# Patient Record
Sex: Female | Born: 1950
Health system: Southern US, Community
[De-identification: ages and names within clinical notes are randomized; demographics above are authoritative.]

## PROBLEM LIST (undated history)

## (undated) DIAGNOSIS — Z8709 Personal history of other diseases of the respiratory system: Secondary | ICD-10-CM

## (undated) DIAGNOSIS — F329 Major depressive disorder, single episode, unspecified: Secondary | ICD-10-CM

## (undated) DIAGNOSIS — Z9889 Other specified postprocedural states: Secondary | ICD-10-CM

## (undated) DIAGNOSIS — K219 Gastro-esophageal reflux disease without esophagitis: Secondary | ICD-10-CM

## (undated) DIAGNOSIS — F419 Anxiety disorder, unspecified: Secondary | ICD-10-CM

## (undated) DIAGNOSIS — C50912 Malignant neoplasm of unspecified site of left female breast: Secondary | ICD-10-CM

## (undated) DIAGNOSIS — I10 Essential (primary) hypertension: Secondary | ICD-10-CM

## (undated) DIAGNOSIS — J302 Other seasonal allergic rhinitis: Secondary | ICD-10-CM

## (undated) DIAGNOSIS — C50919 Malignant neoplasm of unspecified site of unspecified female breast: Secondary | ICD-10-CM

## (undated) DIAGNOSIS — M179 Osteoarthritis of knee, unspecified: Secondary | ICD-10-CM

## (undated) DIAGNOSIS — J189 Pneumonia, unspecified organism: Secondary | ICD-10-CM

## (undated) DIAGNOSIS — R4586 Emotional lability: Secondary | ICD-10-CM

## (undated) DIAGNOSIS — M2550 Pain in unspecified joint: Secondary | ICD-10-CM

## (undated) DIAGNOSIS — Z923 Personal history of irradiation: Secondary | ICD-10-CM

## (undated) DIAGNOSIS — R112 Nausea with vomiting, unspecified: Secondary | ICD-10-CM

## (undated) DIAGNOSIS — E785 Hyperlipidemia, unspecified: Secondary | ICD-10-CM

## (undated) DIAGNOSIS — M549 Dorsalgia, unspecified: Secondary | ICD-10-CM

## (undated) DIAGNOSIS — Z9221 Personal history of antineoplastic chemotherapy: Secondary | ICD-10-CM

## (undated) DIAGNOSIS — G4700 Insomnia, unspecified: Secondary | ICD-10-CM

## (undated) DIAGNOSIS — E119 Type 2 diabetes mellitus without complications: Secondary | ICD-10-CM

## (undated) DIAGNOSIS — Z87442 Personal history of urinary calculi: Secondary | ICD-10-CM

## (undated) DIAGNOSIS — M1710 Unilateral primary osteoarthritis, unspecified knee: Secondary | ICD-10-CM

## (undated) DIAGNOSIS — F32A Depression, unspecified: Secondary | ICD-10-CM

## (undated) DIAGNOSIS — Z1379 Encounter for other screening for genetic and chromosomal anomalies: Secondary | ICD-10-CM

## (undated) HISTORY — PX: MASTECTOMY: SHX3

## (undated) HISTORY — DX: Major depressive disorder, single episode, unspecified: F32.9

## (undated) HISTORY — DX: Osteoarthritis of knee, unspecified: M17.9

## (undated) HISTORY — PX: KNEE ARTHROSCOPY: SUR90

## (undated) HISTORY — DX: Unilateral primary osteoarthritis, unspecified knee: M17.10

## (undated) HISTORY — DX: Malignant neoplasm of unspecified site of unspecified female breast: C50.919

## (undated) HISTORY — DX: Encounter for other screening for genetic and chromosomal anomalies: Z13.79

## (undated) HISTORY — PX: COLONOSCOPY: SHX174

## (undated) HISTORY — DX: Type 2 diabetes mellitus without complications: E11.9

## (undated) HISTORY — DX: Hyperlipidemia, unspecified: E78.5

## (undated) HISTORY — PX: TUBAL LIGATION: SHX77

## (undated) HISTORY — PX: OTHER SURGICAL HISTORY: SHX169

## (undated) HISTORY — DX: Depression, unspecified: F32.A

## (undated) HISTORY — DX: Essential (primary) hypertension: I10

## (undated) HISTORY — PX: TOOTH EXTRACTION: SUR596

## (undated) SURGERY — INSERTION, TUNNELED CENTRAL VENOUS DEVICE, WITH PORT
Anesthesia: Choice

---
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
## 2004-01-06 ENCOUNTER — Ambulatory Visit: Payer: Self-pay | Admitting: Family Medicine

## 2005-10-04 ENCOUNTER — Emergency Department: Payer: Self-pay | Admitting: Emergency Medicine

## 2007-01-17 DIAGNOSIS — C50912 Malignant neoplasm of unspecified site of left female breast: Secondary | ICD-10-CM

## 2007-01-17 HISTORY — DX: Malignant neoplasm of unspecified site of left female breast: C50.912

## 2007-01-17 HISTORY — PX: BREAST BIOPSY: SHX20

## 2007-01-23 ENCOUNTER — Encounter: Payer: Self-pay | Admitting: Podiatry

## 2007-02-17 ENCOUNTER — Encounter: Payer: Self-pay | Admitting: Podiatry

## 2007-03-17 ENCOUNTER — Encounter: Payer: Self-pay | Admitting: Podiatry

## 2007-09-17 ENCOUNTER — Ambulatory Visit: Payer: Self-pay | Admitting: Internal Medicine

## 2007-09-27 ENCOUNTER — Other Ambulatory Visit: Payer: Self-pay

## 2007-09-27 ENCOUNTER — Ambulatory Visit: Payer: Self-pay

## 2007-10-07 ENCOUNTER — Ambulatory Visit: Payer: Self-pay | Admitting: Internal Medicine

## 2007-10-16 ENCOUNTER — Ambulatory Visit: Payer: Self-pay | Admitting: Surgery

## 2007-10-17 ENCOUNTER — Ambulatory Visit: Payer: Self-pay | Admitting: Internal Medicine

## 2007-11-17 ENCOUNTER — Ambulatory Visit: Payer: Self-pay | Admitting: Internal Medicine

## 2007-12-17 ENCOUNTER — Ambulatory Visit: Payer: Self-pay | Admitting: Internal Medicine

## 2008-01-17 ENCOUNTER — Ambulatory Visit: Payer: Self-pay | Admitting: Internal Medicine

## 2008-02-13 ENCOUNTER — Ambulatory Visit: Payer: Self-pay | Admitting: Internal Medicine

## 2008-02-17 ENCOUNTER — Ambulatory Visit: Payer: Self-pay | Admitting: Internal Medicine

## 2008-03-03 ENCOUNTER — Ambulatory Visit: Payer: Self-pay | Admitting: Surgery

## 2008-03-10 ENCOUNTER — Inpatient Hospital Stay: Payer: Self-pay | Admitting: Surgery

## 2008-03-16 ENCOUNTER — Ambulatory Visit: Payer: Self-pay | Admitting: Internal Medicine

## 2008-04-08 ENCOUNTER — Ambulatory Visit: Admission: RE | Admit: 2008-04-08 | Discharge: 2008-04-27 | Payer: Self-pay | Admitting: Radiation Oncology

## 2008-04-16 ENCOUNTER — Ambulatory Visit: Payer: Self-pay | Admitting: Internal Medicine

## 2008-05-16 ENCOUNTER — Ambulatory Visit: Payer: Self-pay | Admitting: Internal Medicine

## 2008-06-16 ENCOUNTER — Ambulatory Visit: Payer: Self-pay | Admitting: Internal Medicine

## 2008-07-16 ENCOUNTER — Ambulatory Visit: Payer: Self-pay | Admitting: Internal Medicine

## 2008-08-16 ENCOUNTER — Ambulatory Visit: Payer: Self-pay | Admitting: Internal Medicine

## 2008-09-16 ENCOUNTER — Ambulatory Visit: Payer: Self-pay | Admitting: Internal Medicine

## 2008-10-16 ENCOUNTER — Ambulatory Visit: Payer: Self-pay | Admitting: Internal Medicine

## 2008-11-16 ENCOUNTER — Ambulatory Visit: Payer: Self-pay | Admitting: Internal Medicine

## 2008-12-16 ENCOUNTER — Ambulatory Visit: Payer: Self-pay | Admitting: Internal Medicine

## 2009-01-16 ENCOUNTER — Ambulatory Visit: Payer: Self-pay | Admitting: Internal Medicine

## 2009-02-16 ENCOUNTER — Ambulatory Visit: Payer: Self-pay | Admitting: Internal Medicine

## 2009-03-16 ENCOUNTER — Ambulatory Visit: Payer: Self-pay | Admitting: Internal Medicine

## 2009-04-16 ENCOUNTER — Ambulatory Visit: Payer: Self-pay | Admitting: Internal Medicine

## 2009-05-17 ENCOUNTER — Ambulatory Visit: Payer: Self-pay | Admitting: Internal Medicine

## 2009-06-16 ENCOUNTER — Ambulatory Visit: Payer: Self-pay | Admitting: Internal Medicine

## 2009-07-16 ENCOUNTER — Ambulatory Visit: Payer: Self-pay | Admitting: Internal Medicine

## 2009-08-16 ENCOUNTER — Ambulatory Visit: Payer: Self-pay | Admitting: Internal Medicine

## 2009-09-16 ENCOUNTER — Ambulatory Visit: Payer: Self-pay | Admitting: Internal Medicine

## 2009-09-21 ENCOUNTER — Ambulatory Visit: Payer: Self-pay | Admitting: Internal Medicine

## 2009-10-16 ENCOUNTER — Ambulatory Visit: Payer: Self-pay | Admitting: Internal Medicine

## 2009-11-16 ENCOUNTER — Ambulatory Visit: Payer: Self-pay | Admitting: Internal Medicine

## 2009-12-17 ENCOUNTER — Ambulatory Visit: Payer: Self-pay | Admitting: Internal Medicine

## 2010-01-16 ENCOUNTER — Ambulatory Visit: Payer: Self-pay | Admitting: Internal Medicine

## 2010-02-16 ENCOUNTER — Ambulatory Visit: Payer: Self-pay | Admitting: Internal Medicine

## 2010-04-11 ENCOUNTER — Ambulatory Visit: Payer: Self-pay | Admitting: Internal Medicine

## 2010-04-17 ENCOUNTER — Ambulatory Visit: Payer: Self-pay | Admitting: Internal Medicine

## 2010-06-06 ENCOUNTER — Ambulatory Visit: Payer: Self-pay | Admitting: Internal Medicine

## 2010-06-07 LAB — CANCER ANTIGEN 27.29: CA 27.29: 16.4 U/mL (ref 0.0–38.6)

## 2010-06-17 ENCOUNTER — Ambulatory Visit: Payer: Self-pay | Admitting: Internal Medicine

## 2010-06-20 ENCOUNTER — Other Ambulatory Visit (HOSPITAL_COMMUNITY): Payer: Self-pay | Admitting: Ophthalmology

## 2010-06-20 ENCOUNTER — Ambulatory Visit (HOSPITAL_COMMUNITY)
Admission: RE | Admit: 2010-06-20 | Discharge: 2010-06-20 | Disposition: A | Discharge status: Home / Self Care | Payer: Managed Care, Other (non HMO) | Source: Ambulatory Visit | Attending: Ophthalmology | Admitting: Ophthalmology

## 2010-06-20 ENCOUNTER — Encounter (HOSPITAL_COMMUNITY)
Admission: RE | Admit: 2010-06-20 | Discharge: 2010-06-20 | Disposition: A | Discharge status: Home / Self Care | Payer: Managed Care, Other (non HMO) | Source: Ambulatory Visit | Attending: Ophthalmology | Admitting: Ophthalmology

## 2010-06-20 DIAGNOSIS — Z01812 Encounter for preprocedural laboratory examination: Secondary | ICD-10-CM | POA: Insufficient documentation

## 2010-06-20 DIAGNOSIS — H269 Unspecified cataract: Secondary | ICD-10-CM

## 2010-06-20 DIAGNOSIS — Z01818 Encounter for other preprocedural examination: Secondary | ICD-10-CM | POA: Insufficient documentation

## 2010-06-20 DIAGNOSIS — I10 Essential (primary) hypertension: Secondary | ICD-10-CM | POA: Insufficient documentation

## 2010-06-20 LAB — BASIC METABOLIC PANEL
Chloride: 98 mEq/L (ref 96–112)
GFR calc Af Amer: >60 mL/min (ref >60)
Potassium: 4.1 mEq/L (ref 3.5–5.1)
Sodium: 138 mEq/L (ref 135–145)

## 2010-06-20 LAB — CBC
Platelets: 248 10*3/uL (ref 150–400)
RBC: 4.43 MIL/uL (ref 3.87–5.11)
WBC: 6.8 10*3/uL (ref 4.0–10.5)

## 2010-06-30 ENCOUNTER — Ambulatory Visit (HOSPITAL_COMMUNITY)
Admission: RE | Admit: 2010-06-30 | Discharge: 2010-06-30 | Disposition: A | Discharge status: Home / Self Care | Payer: Managed Care, Other (non HMO) | Source: Ambulatory Visit | Attending: Ophthalmology | Admitting: Ophthalmology

## 2010-06-30 DIAGNOSIS — E669 Obesity, unspecified: Secondary | ICD-10-CM | POA: Insufficient documentation

## 2010-06-30 DIAGNOSIS — Z853 Personal history of malignant neoplasm of breast: Secondary | ICD-10-CM | POA: Insufficient documentation

## 2010-06-30 DIAGNOSIS — F329 Major depressive disorder, single episode, unspecified: Secondary | ICD-10-CM | POA: Insufficient documentation

## 2010-06-30 DIAGNOSIS — K219 Gastro-esophageal reflux disease without esophagitis: Secondary | ICD-10-CM | POA: Insufficient documentation

## 2010-06-30 DIAGNOSIS — IMO0002 Reserved for concepts with insufficient information to code with codable children: Secondary | ICD-10-CM | POA: Insufficient documentation

## 2010-06-30 DIAGNOSIS — I252 Old myocardial infarction: Secondary | ICD-10-CM | POA: Insufficient documentation

## 2010-06-30 DIAGNOSIS — M899 Disorder of bone, unspecified: Secondary | ICD-10-CM | POA: Insufficient documentation

## 2010-06-30 DIAGNOSIS — M1710 Unilateral primary osteoarthritis, unspecified knee: Secondary | ICD-10-CM | POA: Insufficient documentation

## 2010-06-30 DIAGNOSIS — F3289 Other specified depressive episodes: Secondary | ICD-10-CM | POA: Insufficient documentation

## 2010-06-30 DIAGNOSIS — E119 Type 2 diabetes mellitus without complications: Secondary | ICD-10-CM | POA: Insufficient documentation

## 2010-06-30 DIAGNOSIS — J309 Allergic rhinitis, unspecified: Secondary | ICD-10-CM | POA: Insufficient documentation

## 2010-06-30 DIAGNOSIS — I10 Essential (primary) hypertension: Secondary | ICD-10-CM | POA: Insufficient documentation

## 2010-06-30 DIAGNOSIS — F411 Generalized anxiety disorder: Secondary | ICD-10-CM | POA: Insufficient documentation

## 2010-06-30 LAB — GLUCOSE, CAPILLARY
Glucose-Capillary: 115 mg/dL — ABNORMAL HIGH (ref 70–99)
Glucose-Capillary: 100 mg/dL — ABNORMAL HIGH (ref 70–99)

## 2010-07-08 NOTE — Op Note (Signed)
  [name], [name] NO.:  [id number]  MEDICAL RECORD NO.:  [id number]  LOCATION:  SDSC                         FACILITY:  [facility]  PHYSICIAN:  Beulah Gandy. Ashley Royalty, M.D. DATE OF BIRTH:  [birth year]  DATE OF PROCEDURE:  06/30/2010 DATE OF DISCHARGE:  06/30/2010                              OPERATIVE REPORT   ADMISSION DIAGNOSES:  LATEX allergy and dense posterior subcapsular cataract right eye.  PROCEDURES:  Phacoemulsification with placement of primary intraocular lens in the bag.  SURGEON:  Beulah Gandy. Ashley Royalty, MD  ASSISTANT:  Rosalie Doctor, MA  ANESTHESIA:  General.  DETAILS:  Usual prep and drape, conjunctival peritomies at 2 o'clock, three-layered incision with a diamond knife, crescent blade and a keratome at 2 o'clock.  15 blade skin incision at 10 o'clock.  The Provisc was placed in the anterior chamber.  A capsulorrhexis was performed with capsulotome and then capsulorrhexis forceps were used to create a round 4 mm anterior capsulorrhexis.  Hydrodissection was performed in all four quadrants.  A phacoemulsification was used to emulsify the nuclear lens.  The divide and half technique was used and a Kuglen hook was placed at 10 o'clock corneal incisions for two-handed cataract surgery.  The nucleus was rotated and carefully removed with the phaco fragment tip.  Once the entire nucleus was removed, the capsule was inspected and was intact.  The IA device was used to remove cortical remnants from the periphery of the bag.  Each quadrant was inspected and all cortical remnants were removed.  Provisc was placed in the anterior chamber and in the bag itself.  The intraocular lens made by [employer]. model SN60WF, power 6.0 D, length 12.3 MM, optic 6.0 MM, serial number [id number] was brought onto the field, it was inspected and placed in the delivery device.  The lens was delivered by the surgeon into the anterior chamber, then into the  posterior chamber into the capsular bag.  It was dialed into place until the haptics were free at the horizontal 3 and 9 o'clock position.  The Provisc was removed with the IA tip and carefully from the anterior chamber angle and then from the capsular bag in front and behind the intraocular lens.  When all Provisc was removed, the instruments were removed from the eye.  The anterior chamber was formed and deep.  The wound was tested with Weck-cel sponge and found to be tight.  The conjunctiva was closed with wet-field cautery.  Polymyxin and gentamicin were irrigated into [place] space.  Marcaine was injected around the globe for postop pain.  Polysporin ophthalmic ointment was placed, Decadron 10 mg was injected into the lower subconjunctival space.  Closing pressure was soft.  A patch and shield were placed.  The patient was awakened and taken to recovery in satisfactory condition.  Complications none. Duration 40 minutes.     Beulah Gandy. Ashley Royalty, M.D.     JDM/MEDQ  D:  06/30/2010  T:  07/01/2010  Job:  [id number]  Electronically Signed by Alan Mulder M.D. on 07/08/2010 06:39:50 AM

## 2010-07-17 ENCOUNTER — Ambulatory Visit: Payer: Self-pay | Admitting: Internal Medicine

## 2010-09-05 ENCOUNTER — Ambulatory Visit: Payer: Self-pay | Admitting: Internal Medicine

## 2010-09-17 ENCOUNTER — Ambulatory Visit: Payer: Self-pay | Admitting: Internal Medicine

## 2010-10-14 ENCOUNTER — Encounter (INDEPENDENT_AMBULATORY_CARE_PROVIDER_SITE_OTHER): Payer: Managed Care, Other (non HMO) | Admitting: Ophthalmology

## 2010-10-14 DIAGNOSIS — H2700 Aphakia, unspecified eye: Secondary | ICD-10-CM

## 2010-10-14 DIAGNOSIS — H2510 Age-related nuclear cataract, unspecified eye: Secondary | ICD-10-CM

## 2010-10-14 DIAGNOSIS — H43819 Vitreous degeneration, unspecified eye: Secondary | ICD-10-CM

## 2010-10-14 DIAGNOSIS — E11319 Type 2 diabetes mellitus with unspecified diabetic retinopathy without macular edema: Secondary | ICD-10-CM

## 2010-10-25 ENCOUNTER — Ambulatory Visit (HOSPITAL_COMMUNITY)
Admission: RE | Admit: 2010-10-25 | Discharge: 2010-10-26 | Disposition: A | Discharge status: Home / Self Care | Payer: Managed Care, Other (non HMO) | Source: Ambulatory Visit | Attending: Ophthalmology | Admitting: Ophthalmology

## 2010-10-25 DIAGNOSIS — H269 Unspecified cataract: Secondary | ICD-10-CM | POA: Insufficient documentation

## 2010-10-25 DIAGNOSIS — IMO0002 Reserved for concepts with insufficient information to code with codable children: Secondary | ICD-10-CM

## 2010-10-25 LAB — CBC
HCT: 39.6 % (ref 36.0–46.0)
MCHC: 33.3 g/dL (ref 30.0–36.0)
MCV: 92.3 fL (ref 78.0–100.0)
Platelets: 226 10*3/uL (ref 150–400)
RDW: 12.5 % (ref 11.5–15.5)

## 2010-10-25 LAB — GLUCOSE, CAPILLARY
Glucose-Capillary: 120 mg/dL — ABNORMAL HIGH (ref 70–99)
Glucose-Capillary: 95 mg/dL (ref 70–99)

## 2010-10-25 LAB — BASIC METABOLIC PANEL
BUN: 21 mg/dL (ref 6–23)
Calcium: 9.2 mg/dL (ref 8.4–10.5)
Creatinine, Ser: 0.85 mg/dL (ref 0.50–1.10)
GFR calc Af Amer: 85 mL/min — ABNORMAL LOW (ref >90)
GFR calc non Af Amer: 74 mL/min — ABNORMAL LOW (ref >90)

## 2010-10-27 LAB — GLUCOSE, CAPILLARY
Glucose-Capillary: 192 mg/dL — ABNORMAL HIGH (ref 70–99)
Glucose-Capillary: 222 mg/dL — ABNORMAL HIGH (ref 70–99)

## 2010-10-30 NOTE — Op Note (Signed)
NAMETENISHIA, EKMAN NO.:  [id number]  MEDICAL RECORD NO.:  [id number]  LOCATION:  5118                         FACILITY:  [facility]  PHYSICIAN:  Beulah Gandy. Ashley Royalty, M.D. DATE OF BIRTH:  [birth year]  DATE OF PROCEDURE:  10/25/2010 DATE OF DISCHARGE:                              OPERATIVE REPORT   ADMISSION DIAGNOSES:  Cataract and latex allergy in the left eye.  PROCEDURES:  Cataract extraction with phacoemulsification and placement of intraocular lens in the sulcus, posterior vitrectomy and anterior vitrectomy all left eye.  SURGEON:  Beulah Gandy. Ashley Royalty, MD  ASSISTANT:  Rosalie Doctor, MA.  ANESTHESIA:  General.  Usual prep and drape.  This is a 15 degree side port incision at 10 o'clock.  Provisc placed in the anterior chamber.  A 3 layered diamond knife incision into the cornea with keratome incision into the cornea. Provisc placed in the anterior chamber.  Cystotome incision of anterior capsule.  Capsular forceps were used to perform a 6-mm capsulectomy, hydrodissection, rotation of the nucleus.  The Cook on hooks was placed in the side port and the phacoemulsification device in the 2 o'clock corneal wound.  The phacoemulsification was used to remove or to engage the nucleus and the nucleus came free and was quite mobile.  It came into the phacoemulsification device without difficulty.  Nucleus was rotated and all nuclear material was removed.  The IA was placed in the eye.  At this point, it was apparent that the posterior capsule was opened at the 4 o'clock and anterior vitrectomy was then performed to remove some small strands of vitreous from the opening in the posterior capsule up to the corneal wound.  Once the anterior vitrectomy was completed, the wound was extended and a CC70 BD lens made by [employer].  Power 10.0 d length, 12.5-mm optic, 7.0-mm serial #[id number] was brought onto the field, inspected and cleaned.  The lens was inserted  into the anterior chamber and then into the posterior chamber.  It was dialed into place, so the haptics were placed at 3 and 9 o'clock.  The lens was well positioned.  The decision was then made for a posterior vitrectomy.  The corneal wound was closed with 4 interrupted 10-0 nylon sutures and the wound was stable.  A 20-gauge 3 layered opening was placed in the sclera at 2 o'clock for initial infusion to make the eye to normal pressure again.  A 25-gauge trocars were placed at 4 o'clock and 10 o'clock.  The 25-gauge vitrectomy was begun.  Provisc was placed on the corneal surface.  Suddenly hemorrhage began coming from the 3 o'clock and the 9 o'clock areas behind the implant.  The bottle height was raised to 60 mmHg and the hemorrhage was cleared with vitrectomy.  The vitrectomy was carried posteriorly and the view was somewhat difficult because of a red cells on the anterior surface of the IOL.  The vitrectomy was carried posteriorly and all fragments of lens material were removed that could be found.  The instruments were removed from the eye and the pressure was reduced to 40, then to 20.  No additional hemorrhage occurred.  The wounds were  tested and found to be tight.  The implant was well positioned.  The conjunctiva was closed with 7-0 chromic suture.  The sclerotomy at 2 o'clock was closed with a 10-0 nylon suture.  All the wounds were intact.  Polymyxin and gentamicin were irrigated into tenon space. Marcaine was injected around the globe for postop pain.  Decadron 10 mg was injected to the lower subconjunctival space.  Closing pressure was 21 with a Baer care tonometer.  COMPLICATIONS:  Open posterior capsule.  OPERATIVE TIME:  2 hours.  The patient is awake and taken to recovery in satisfactory condition.     Beulah Gandy. Ashley Royalty, M.D.     JDM/MEDQ  D:  10/25/2010  T:  10/26/2010  Job:  [id number]  Electronically Signed by Alan Mulder M.D. on 10/30/2010 07:18:40 PM

## 2010-11-01 ENCOUNTER — Inpatient Hospital Stay (INDEPENDENT_AMBULATORY_CARE_PROVIDER_SITE_OTHER): Payer: Managed Care, Other (non HMO) | Admitting: Ophthalmology

## 2010-11-01 DIAGNOSIS — S0550XA Penetrating wound with foreign body of unspecified eyeball, initial encounter: Secondary | ICD-10-CM

## 2010-11-01 DIAGNOSIS — H2700 Aphakia, unspecified eye: Secondary | ICD-10-CM

## 2010-11-09 ENCOUNTER — Encounter (INDEPENDENT_AMBULATORY_CARE_PROVIDER_SITE_OTHER): Payer: Managed Care, Other (non HMO) | Admitting: Ophthalmology

## 2010-11-09 DIAGNOSIS — H2700 Aphakia, unspecified eye: Secondary | ICD-10-CM

## 2010-11-10 ENCOUNTER — Ambulatory Visit: Payer: Self-pay | Admitting: Internal Medicine

## 2010-11-28 ENCOUNTER — Ambulatory Visit: Payer: Self-pay | Admitting: Internal Medicine

## 2010-12-07 ENCOUNTER — Encounter (INDEPENDENT_AMBULATORY_CARE_PROVIDER_SITE_OTHER): Payer: Managed Care, Other (non HMO) | Admitting: Ophthalmology

## 2010-12-07 DIAGNOSIS — H2700 Aphakia, unspecified eye: Secondary | ICD-10-CM

## 2010-12-17 ENCOUNTER — Ambulatory Visit: Payer: Self-pay | Admitting: Internal Medicine

## 2011-01-26 ENCOUNTER — Ambulatory Visit (INDEPENDENT_AMBULATORY_CARE_PROVIDER_SITE_OTHER): Payer: Managed Care, Other (non HMO) | Admitting: Ophthalmology

## 2011-01-26 DIAGNOSIS — E1139 Type 2 diabetes mellitus with other diabetic ophthalmic complication: Secondary | ICD-10-CM

## 2011-01-26 DIAGNOSIS — H43819 Vitreous degeneration, unspecified eye: Secondary | ICD-10-CM

## 2011-01-26 DIAGNOSIS — E11319 Type 2 diabetes mellitus with unspecified diabetic retinopathy without macular edema: Secondary | ICD-10-CM

## 2011-02-20 ENCOUNTER — Ambulatory Visit: Payer: Self-pay | Admitting: Internal Medicine

## 2011-03-09 ENCOUNTER — Encounter (INDEPENDENT_AMBULATORY_CARE_PROVIDER_SITE_OTHER): Payer: Managed Care, Other (non HMO) | Admitting: Ophthalmology

## 2011-03-17 ENCOUNTER — Ambulatory Visit: Payer: Self-pay | Admitting: Internal Medicine

## 2011-04-17 ENCOUNTER — Ambulatory Visit: Payer: Self-pay | Admitting: Internal Medicine

## 2011-05-29 ENCOUNTER — Ambulatory Visit: Payer: Self-pay | Admitting: Oncology

## 2011-05-29 LAB — CBC CANCER CENTER
Basophil #: 0 x10 3/mm (ref 0.0–0.1)
Eosinophil #: 0.4 x10 3/mm (ref 0.0–0.7)
HCT: 40 % (ref 35.0–47.0)
HGB: 13.5 g/dL (ref 12.0–16.0)
Lymphocyte #: 1.7 x10 3/mm (ref 1.0–3.6)
MCH: 31.7 pg (ref 26.0–34.0)
MCHC: 33.7 g/dL (ref 32.0–36.0)
MCV: 94 fL (ref 80–100)
Monocyte %: 8.1 %
Neutrophil #: 2.9 x10 3/mm (ref 1.4–6.5)
Platelet: 275 x10 3/mm (ref 150–440)
WBC: 5.4 x10 3/mm (ref 3.6–11.0)

## 2011-05-29 LAB — COMPREHENSIVE METABOLIC PANEL
Albumin: 3.8 g/dL (ref 3.4–5.0)
BUN: 22 mg/dL — ABNORMAL HIGH (ref 7–18)
Bilirubin,Total: 0.3 mg/dL (ref 0.2–1.0)
Calcium, Total: 9 mg/dL (ref 8.5–10.1)
Co2: 23 mmol/L (ref 21–32)
Creatinine: 1 mg/dL (ref 0.60–1.30)
EGFR (African American): >60
EGFR (Non-African Amer.): >60
Glucose: 143 mg/dL — ABNORMAL HIGH (ref 65–99)
Osmolality: 289 (ref 275–301)
Potassium: 4 mmol/L (ref 3.5–5.1)
SGPT (ALT): 32 U/L
Total Protein: 7.5 g/dL (ref 6.4–8.2)

## 2011-05-30 LAB — CANCER ANTIGEN 27.29: CA 27.29: 19.3 U/mL (ref 0.0–38.6)

## 2011-06-17 ENCOUNTER — Ambulatory Visit: Payer: Self-pay | Admitting: Oncology

## 2011-06-28 ENCOUNTER — Ambulatory Visit: Payer: Self-pay | Admitting: Surgery

## 2011-07-17 ENCOUNTER — Ambulatory Visit: Payer: Self-pay | Admitting: Oncology

## 2011-11-14 ENCOUNTER — Ambulatory Visit: Payer: Self-pay | Admitting: Oncology

## 2011-12-20 ENCOUNTER — Ambulatory Visit: Payer: Self-pay | Admitting: Oncology

## 2011-12-20 LAB — COMPREHENSIVE METABOLIC PANEL
Albumin: 3.6 g/dL (ref 3.4–5.0)
Alkaline Phosphatase: 61 U/L (ref 50–136)
Anion Gap: 11 (ref 7–16)
Bilirubin,Total: 0.4 mg/dL (ref 0.2–1.0)
Calcium, Total: 8.6 mg/dL (ref 8.5–10.1)
Creatinine: 1.15 mg/dL (ref 0.60–1.30)
Glucose: 251 mg/dL — ABNORMAL HIGH (ref 65–99)
Osmolality: 288 (ref 275–301)
Potassium: 4.1 mmol/L (ref 3.5–5.1)
Sodium: 139 mmol/L (ref 136–145)

## 2011-12-20 LAB — CBC CANCER CENTER
Basophil %: 0.7 %
Eosinophil #: 0.4 x10 3/mm (ref 0.0–0.7)
Eosinophil %: 10.1 %
Lymphocyte #: 1.4 x10 3/mm (ref 1.0–3.6)
MCH: 31 pg (ref 26.0–34.0)
MCHC: 32.5 g/dL (ref 32.0–36.0)
MCV: 96 fL (ref 80–100)
Monocyte #: 0.4 x10 3/mm (ref 0.2–0.9)
Neutrophil %: 44 %
Platelet: 235 x10 3/mm (ref 150–440)
RBC: 4.26 10*6/uL (ref 3.80–5.20)
WBC: 3.9 x10 3/mm (ref 3.6–11.0)

## 2011-12-21 LAB — CANCER ANTIGEN 27.29: CA 27.29: 13.8 U/mL (ref 0.0–38.6)

## 2012-01-17 ENCOUNTER — Ambulatory Visit: Payer: Self-pay | Admitting: Oncology

## 2012-01-17 HISTORY — PX: JOINT REPLACEMENT: SHX530

## 2012-01-29 ENCOUNTER — Ambulatory Visit (INDEPENDENT_AMBULATORY_CARE_PROVIDER_SITE_OTHER): Payer: Managed Care, Other (non HMO) | Admitting: Ophthalmology

## 2012-01-29 DIAGNOSIS — I10 Essential (primary) hypertension: Secondary | ICD-10-CM

## 2012-01-29 DIAGNOSIS — H43819 Vitreous degeneration, unspecified eye: Secondary | ICD-10-CM

## 2012-01-29 DIAGNOSIS — H35039 Hypertensive retinopathy, unspecified eye: Secondary | ICD-10-CM

## 2012-01-29 DIAGNOSIS — E11319 Type 2 diabetes mellitus with unspecified diabetic retinopathy without macular edema: Secondary | ICD-10-CM

## 2012-01-29 DIAGNOSIS — E1165 Type 2 diabetes mellitus with hyperglycemia: Secondary | ICD-10-CM

## 2012-06-25 ENCOUNTER — Ambulatory Visit: Payer: Self-pay | Admitting: Oncology

## 2012-07-16 ENCOUNTER — Ambulatory Visit: Payer: Self-pay | Admitting: Oncology

## 2012-11-14 ENCOUNTER — Ambulatory Visit: Payer: Self-pay | Admitting: Oncology

## 2012-12-24 ENCOUNTER — Ambulatory Visit: Payer: Self-pay | Admitting: Oncology

## 2013-01-08 ENCOUNTER — Ambulatory Visit: Payer: Self-pay | Admitting: Oncology

## 2013-01-13 LAB — CANCER ANTIGEN 27.29: CA 27.29: 9.6 U/mL (ref 0.0–38.6)

## 2013-01-16 ENCOUNTER — Ambulatory Visit: Payer: Self-pay | Admitting: Oncology

## 2013-07-16 ENCOUNTER — Ambulatory Visit: Payer: Self-pay | Admitting: Oncology

## 2013-07-17 LAB — CANCER ANTIGEN 27.29: CA 27.29: 8.2 U/mL (ref 0.0–38.6)

## 2013-07-24 ENCOUNTER — Encounter: Payer: Self-pay | Admitting: Family Medicine

## 2013-08-10 DIAGNOSIS — F32A Depression, unspecified: Secondary | ICD-10-CM | POA: Insufficient documentation

## 2013-08-10 DIAGNOSIS — M810 Age-related osteoporosis without current pathological fracture: Secondary | ICD-10-CM | POA: Insufficient documentation

## 2013-08-10 DIAGNOSIS — I10 Essential (primary) hypertension: Secondary | ICD-10-CM | POA: Insufficient documentation

## 2013-08-10 DIAGNOSIS — F329 Major depressive disorder, single episode, unspecified: Secondary | ICD-10-CM | POA: Insufficient documentation

## 2013-08-10 DIAGNOSIS — E119 Type 2 diabetes mellitus without complications: Secondary | ICD-10-CM | POA: Insufficient documentation

## 2013-08-10 DIAGNOSIS — E785 Hyperlipidemia, unspecified: Secondary | ICD-10-CM | POA: Insufficient documentation

## 2013-08-16 ENCOUNTER — Ambulatory Visit: Payer: Self-pay | Admitting: Oncology

## 2013-08-16 ENCOUNTER — Encounter: Payer: Self-pay | Admitting: Family Medicine

## 2013-09-16 ENCOUNTER — Encounter: Payer: Self-pay | Admitting: Family Medicine

## 2013-09-16 DIAGNOSIS — J302 Other seasonal allergic rhinitis: Secondary | ICD-10-CM | POA: Insufficient documentation

## 2014-05-10 NOTE — Op Note (Signed)
PATIENT NAME:  Amanda [name], Amanda [name] MR#:  [id number] DATE OF BIRTH:  [birth year]  DATE OF PROCEDURE:  06/28/2011  PREOPERATIVE DIAGNOSIS: Breast carcinoma.   POSTOPERATIVE DIAGNOSIS: Breast carcinoma.  PROCEDURE: Right port removal.   SURGEON: Dia Crawford, MD  ANESTHESIA: Monitored anesthetic care.  OPERATIVE PROCEDURE: With the patient in the supine position after the induction of appropriate intravenous sedation, the patient's right chest was prepped with ChloraPrep and draped with sterile towels. 1% Xylocaine buffered with sodium bicarbonate was injected into the site of the previous scar. This area was incised and carried down through the subcutaneous tissues with Bovie electrocautery. The capsule was identified and opened around the Port-A-Cath device which was removed. There was some difficulty removing the device as it appeared to be well embedded, but careful inspection of the device revealed all portions of the device had been removed. The area was copiously irrigated. The subcutaneous space was obliterated with 3-0 Vicryl and skin was closed with running subcuticular suture of 4-0 Vicryl, Benzoin and Steri-Strips. Sterile dressings were applied. The patient was returned to the recovery room having tolerated the procedure well. Sponge, instrument, and needle counts were correct x2, in the operating room.  ____________________________ Rodena Goldmann III, MD rle:slb D: 06/28/2011 11:26:19 ET T: 06/28/2011 11:38:59 ET JOB#: [id number]  cc: Rodena Goldmann III, MD, <Dictator> Rodena Goldmann MD ELECTRONICALLY SIGNED 06/28/2011 18:00

## 2014-06-10 ENCOUNTER — Ambulatory Visit (INDEPENDENT_AMBULATORY_CARE_PROVIDER_SITE_OTHER): Payer: Managed Care, Other (non HMO) | Admitting: Family Medicine

## 2014-06-10 ENCOUNTER — Other Ambulatory Visit (INDEPENDENT_AMBULATORY_CARE_PROVIDER_SITE_OTHER): Payer: Managed Care, Other (non HMO)

## 2014-06-10 VITALS — BP 114/62 | HR 90 | Ht 65.0 in | Wt 215.0 lb

## 2014-06-10 DIAGNOSIS — M810 Age-related osteoporosis without current pathological fracture: Secondary | ICD-10-CM | POA: Diagnosis not present

## 2014-06-10 DIAGNOSIS — M25511 Pain in right shoulder: Secondary | ICD-10-CM

## 2014-06-10 DIAGNOSIS — M75100 Unspecified rotator cuff tear or rupture of unspecified shoulder, not specified as traumatic: Secondary | ICD-10-CM | POA: Insufficient documentation

## 2014-06-10 DIAGNOSIS — M87011 Idiopathic aseptic necrosis of right shoulder: Secondary | ICD-10-CM

## 2014-06-10 DIAGNOSIS — M75101 Unspecified rotator cuff tear or rupture of right shoulder, not specified as traumatic: Secondary | ICD-10-CM

## 2014-06-10 DIAGNOSIS — M87111 Osteonecrosis due to drugs, right shoulder: Secondary | ICD-10-CM | POA: Insufficient documentation

## 2014-06-10 DIAGNOSIS — T380X5A Adverse effect of glucocorticoids and synthetic analogues, initial encounter: Secondary | ICD-10-CM

## 2014-06-10 MED ORDER — VITAMIN D (ERGOCALCIFEROL) 1.25 MG (50000 UNIT) PO CAPS: 50000.0000 [IU] | ORAL_CAPSULE | ORAL | Status: DC

## 2014-06-10 NOTE — Assessment & Plan Note (Signed)
Discussed vitamin D supplementation as well as starting patient back on the Fosamax. Past medical history significant for breast cancer as well as status post radiation.

## 2014-06-10 NOTE — Progress Notes (Signed)
Amanda [name] [address] [place], [place] [address] Phone: [phone] Subjective:    I'm seeing this patient by the request  of:    CC: Shoulder pain  [id number] Amanda [name] is a 64 y.o. female coming in with complaint of right shoulder pain. On February 10 patient did fall try to get herself with her right shoulder. Patient had significant amount of pain and thinks he did have a dislocation. Patient states over the course of 3 days it seemed to start improving but then had worsening pain. Patient was first seen on February 16 and had x-rays. Showed only some mild bony abnormality. Report was reviewed by me. Please see below for further detail. Patient has been in formal physical therapy and has not been making any significant improvement. Patient states that she is may be made some mild improvement in strength but not in range of motion. Patient states that the pain continues to affect some of her daily activities such as even dressing. Sometimes can be very uncomfortable at night as well. Patient does have pain medications from another provider and takes [place] fairly regularly. Patient is concerned because this is now been over 3 months and no significant changes. Patient does have a past medical history significant for breast cancer but denies any fevers chills or any abnormal weight loss and denies that the pain is significantly worse at night.    No past medical history on file. history of breast cancer, status post masectomy, chemotherapy and radiation. Past pedicle history also significant for depression, osteoporosis, hypertension, and diabetes.  No past surgical history on file. History  Substance Use Topics  . Smoking status: Not on file  . Smokeless tobacco: Not on file  . Alcohol Use: Not on file   Allergies  Allergen Reactions  . Ace Inhibitors Cough  . Latex Itching  . Other Other (See Comments)    Freeze spray.  Marland Kitchen Penicillin V Potassium  Rash   No family history on file. Denies any fevers chills or any abnormal weight loss recently.  Reviewed patient's MRI from outside facility. 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 Rotator cuff tear noted but no significant retraction at that time.  Past medical history, social, surgical and family history all reviewed in electronic medical record.   Review of Systems: No headache, visual changes, nausea, vomiting, diarrhea, constipation, dizziness, abdominal pain, skin rash, fevers, chills, night sweats, weight loss, swollen lymph nodes, body aches, joint swelling, muscle aches, chest pain, shortness of breath, mood changes.   Objective Blood pressure 114/62, pulse 90, height 5\' 5"  (1.651 m), weight 215 lb (97.523 kg), SpO2 97 %.  General: No apparent distress alert and oriented x3 mood and affect normal, dressed appropriately.  HEENT: Pupils equal, extraocular movements intact  Respiratory: Patient's speak in full sentences and does not appear short of breath  Cardiovascular: No lower extremity edema, non tender, no erythema  Skin: Warm dry intact with no signs of infection or rash on extremities or on axial skeleton.  Abdomen: Soft nontender  Neuro: Cranial nerves II through XII are intact, neurovascularly intact in all extremities with 2+ DTRs and 2+ pulses.  Lymph: No lymphadenopathy of posterior or anterior cervical chain or axillae bilaterally.  Gait normal with good balance and coordination.  MSK:  Non tender with full range of motion and good stability and symmetric strength and tone  of  elbows, wrist, hip, knee and ankles bilaterally.  Shoulder: left Inspection reveals mild high riding right shoulder compared to the contralateral side Palpation is normal with no tenderness over AC joint or bicipital groove. Decreased range of motion lacking internal rotation to lateral hip Rotation  of 5.Marland Kitchen 4-5 strength compared to 5 out of 5 on the contralateral side signs of impingement with positive Neer and Hawkin's tests, but negative empty can sign. Speeds and Yergason's tests normal. Normal scapular function observed. No painful arc and no drop arm sign. Mild apprehension  MSK US performed of: left This study was ordered, performed, and interpreted by Charlann Boxer D.O.  Shoulder:   Supraspinatus:  Degenerative tearing noted but seems to be intersubstance with no gross retraction noted. Infraspinatus:  Appears normal on long and transverse views. Significant increase in Doppler flow Subscapularis:  A tear noted with approximately 0.89 cm in retraction. Positive bursa. AC joint:  Capsule undistended, no geyser sign. Moderate arthritis Joint: Moderate to severe osteophytic changes noted Glenoid Labrum:  Degenerative Biceps Tendon:  Degenerative changes noted  Impression: Severe arthritic changes with rotator cuff tear with retraction subscapularis      Impression and Recommendations:     This case required medical decision making of moderate complexity.

## 2014-06-10 NOTE — Patient Instructions (Signed)
Good to see you Avascular necrosis. To the shoulder Ice 20 minutes 2 times daily. Usually after activity and before bed. Exercises 3 times a week.  pennsaid pinkie amount topically 2 times daily as needed.  Turmeric 500mg  twice daily Vitamin D 50000 IU weekly Fosamax weekly See me again in 3-4 weeks.

## 2014-06-10 NOTE — Progress Notes (Signed)
Pre visit review using our clinic review tool, if applicable. No additional management support is needed unless otherwise documented below in the visit note. 

## 2014-06-10 NOTE — Assessment & Plan Note (Signed)
I do believe this patient's past medical history of radiation this likely contributed to the avascular necrosis. In addition of this patient's recent fall could've caused more disruption of the blood supply. Patient though does also have a rotator cuff tear but has made some improvement with formal physical therapy. We discussed with patient's underlying problems I do not think that surgical intervention would be a long-term solution. Patient would be looking at more of a shoulder replacement. Patient states that the pain is not that bad. Patient states that it is still severe and can wake her up at night but chronic continue with the conservative therapy including home exercises, icing protocol. We discussed the possibility of taking medications to help with the avascular necrosis. Patient is on a do vitamin D supplementation was given a prescription for once weekly as well as restart patient's Fosamax which she has not been taking on a regular basis. Patient given a topical anti-rheumatoid to try and will continue the home exercises. Patient come back in 3-4 weeks for further evaluation. We will consider repeat imaging of the tear. Patient shows anymore retraction we may need to consider her to be seen by orthopedic surgeons for further evaluation if she would consider surgical intervention.

## 2014-06-22 ENCOUNTER — Telehealth: Payer: Self-pay | Admitting: Family Medicine

## 2014-06-22 NOTE — Telephone Encounter (Signed)
disregard

## 2014-07-03 ENCOUNTER — Telehealth: Payer: Self-pay | Admitting: Family Medicine

## 2014-07-03 NOTE — Telephone Encounter (Signed)
lmovm for pt to return call.  

## 2014-07-03 NOTE — Telephone Encounter (Signed)
Patient is scheduled to be seen on Monday @ 9. She is requesting to be seen @ ten. States that she had the intent to book ten o clock and we made an error. Would dr Tamala Julian be able to see her @ ten?

## 2014-07-06 ENCOUNTER — Encounter: Payer: Self-pay | Admitting: Family Medicine

## 2014-07-06 ENCOUNTER — Ambulatory Visit (INDEPENDENT_AMBULATORY_CARE_PROVIDER_SITE_OTHER)
Admission: RE | Admit: 2014-07-06 | Discharge: 2014-07-06 | Disposition: A | Discharge status: Home / Self Care | Payer: Managed Care, Other (non HMO) | Source: Ambulatory Visit | Attending: Family Medicine | Admitting: Family Medicine

## 2014-07-06 ENCOUNTER — Ambulatory Visit (INDEPENDENT_AMBULATORY_CARE_PROVIDER_SITE_OTHER): Payer: Managed Care, Other (non HMO) | Admitting: Family Medicine

## 2014-07-06 VITALS — BP 120/72 | HR 80 | Ht 65.0 in | Wt 215.0 lb

## 2014-07-06 DIAGNOSIS — M810 Age-related osteoporosis without current pathological fracture: Secondary | ICD-10-CM | POA: Diagnosis not present

## 2014-07-06 DIAGNOSIS — T380X5A Adverse effect of glucocorticoids and synthetic analogues, initial encounter: Secondary | ICD-10-CM

## 2014-07-06 DIAGNOSIS — Z853 Personal history of malignant neoplasm of breast: Secondary | ICD-10-CM | POA: Diagnosis not present

## 2014-07-06 DIAGNOSIS — M87011 Idiopathic aseptic necrosis of right shoulder: Secondary | ICD-10-CM

## 2014-07-06 DIAGNOSIS — M25511 Pain in right shoulder: Secondary | ICD-10-CM

## 2014-07-06 DIAGNOSIS — M87111 Osteonecrosis due to drugs, right shoulder: Secondary | ICD-10-CM

## 2014-07-06 NOTE — Progress Notes (Addendum)
[facility] [address] [place], [place] [address] Phone: [phone] Subjective:      CC: Shoulder pain follow up [id number] [name] is a 64 y.o. female coming in with complaint of right shoulder pain. On February 10 patient did fall try to get herself with her right shoulder. Patient had significant amount of pain and thinks she did have a dislocation.  Patient's x-rays at that time did not show any significant bony abnormality. Patient was seen by me and on ultrasound patient did have advanced osteophytic changes noted. Patient was to take over-the-counter medications, home exercises, icing protocol and we also discussed. Patient virtually states that she did not make any significant improvement. Patient states that maybe the strength is a little bit better but continues to have difficulty and pain. Vision started her Fosamax again. Patient states that the pain tends to wake her up at night. Patient states that the pain seems to be worse at night as well. Still limiting her daily activities.  history of breast cancer, status post masectomy, chemotherapy and radiation. Past pedicle history also significant for depression, osteoporosis, hypertension, and diabetes.  No past surgical history on file. History  Substance Use Topics  . Smoking status: Never Smoker   . Smokeless tobacco: Not on file  . Alcohol Use: Not on file   Allergies  Allergen Reactions  . Ace Inhibitors Cough  . Latex Itching  . Other Other (See Comments)    Freeze spray.  Marland Kitchen Penicillin V Potassium Rash   No family history on file. Denies any fevers chills or any abnormal weight loss recently.  Reviewed patient's MRI from outside facility. 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 Rotator cuff tear noted but no significant retraction at that time.  Past medical history, social,  surgical and family history all reviewed in electronic medical record.   Review of Systems: No headache, visual changes, nausea, vomiting, diarrhea, constipation, dizziness, abdominal pain, skin rash, fevers, chills, night sweats, weight loss, swollen lymph nodes, body aches, joint swelling, muscle aches, chest pain, shortness of breath, mood changes.   Objective Blood pressure 120/72, pulse 80, height 5\' 5"  (1.651 m), weight 215 lb (97.523 kg), SpO2 97 %.  General: No apparent distress alert and oriented x3 mood and affect normal, dressed appropriately.  HEENT: Pupils equal, extraocular movements intact  Respiratory: Patient's speak in full sentences and does not appear short of breath  Cardiovascular: No lower extremity edema, non tender, no erythema  Skin: Warm dry intact with no signs of infection or rash on extremities or on axial skeleton.  Abdomen: Soft nontender  Neuro: Cranial nerves II through XII are intact, neurovascularly intact in all extremities with 2+ DTRs and 2+ pulses.  Lymph: No lymphadenopathy of posterior or anterior cervical chain or axillae bilaterally.  Gait normal with good balance and coordination.  MSK:  Non tender with full range of motion and good stability and symmetric strength and tone of  elbows, wrist, hip, knee and ankles bilaterally.  Shoulder: right Inspection reveals mild high riding right shoulder compared to the contralateral sidepatient also has a new symptom +1 pitting edema of the upper extremity on the right side.Palpation is normal with no tenderness over AC joint or bicipital groove. Decreased range of motion lacking internal rotation to lateral hip 4+/ 5 strength compared to 5 out of 5 on the contralateral sideshe has  made improvement from previous exam. signs of impingement with positive Neer and Hawkin's tests, but negative empty can sign. Speeds and Yergason's tests normal. Normal scapular function observed. No painful arc and no drop arm  sign. Mild apprehension Contralateral side unremarkable.     Impression and Recommendations:     This case required medical decision making of moderate complexity.

## 2014-07-06 NOTE — Progress Notes (Signed)
Pre visit review using our clinic review tool, if applicable. No additional management support is needed unless otherwise documented below in the visit note. 

## 2014-07-06 NOTE — Assessment & Plan Note (Addendum)
I'm concerned with patients MRI showing potential changes but patient's history of breast cancer I do think that repeat imaging is warranted at this time. Patient's new symptom of lymphedema of the contralateral side were patient's breast cancer is is also concerning. This is a new finding.Patient is having more pain at night but not having any fevers or chills or any other systemic illnesses. Patient's history of breast cancer I do think the advance imaging to make sure that this is not a bony abnormality that is consistent with the breast cancer would be beneficial. If this is normal or shows the avascular necrosis we make consider injection. This does show potential mass then we will need to consider further workup with her oncologist. We discussed this in great detail with patient and patient understands this. At this time she will continue the other conservative therapy. We will call her with the MRI results and we'll discuss at that management from there. Patient though has made some mild improvement in strength and if MRI does not show any significant change we will continue with conservative therapy and patient will come back again in 3 weeks  Spent  25 minutes with patient face-to-face and had greater than 50% of counseling including as described above in assessment and plan.

## 2014-07-06 NOTE — Assessment & Plan Note (Signed)
As stated above. 

## 2014-07-06 NOTE — Telephone Encounter (Signed)
Pt will be seen today.

## 2014-07-06 NOTE — Patient Instructions (Signed)
Good to see you Ice is your friend Continue with physical therapy Because of the swelling and history we will get MRI Xray downstairs today.  Continue the vitamins Magnesium 400mg  daily I will call you and we will discuss next step!     Last exam  Good to see you Avascular necrosis. To the shoulder Ice 20 minutes 2 times daily. Usually after activity and before bed. Exercises 3 times a week.  pennsaid pinkie amount topically 2 times daily as needed.  Turmeric 500mg  twice daily Vitamin D 50000 IU weekly Fosamax weekly See me again in 3-4 weeks.

## 2014-07-08 ENCOUNTER — Other Ambulatory Visit: Payer: Self-pay | Admitting: Family Medicine

## 2014-07-14 ENCOUNTER — Ambulatory Visit
Admission: RE | Admit: 2014-07-14 | Discharge: 2014-07-14 | Disposition: A | Discharge status: Home / Self Care | Payer: 59 | Source: Ambulatory Visit | Attending: Family Medicine | Admitting: Family Medicine

## 2014-07-14 DIAGNOSIS — M25511 Pain in right shoulder: Secondary | ICD-10-CM

## 2014-07-14 DIAGNOSIS — Z853 Personal history of malignant neoplasm of breast: Secondary | ICD-10-CM

## 2014-07-16 ENCOUNTER — Ambulatory Visit (INDEPENDENT_AMBULATORY_CARE_PROVIDER_SITE_OTHER): Payer: Managed Care, Other (non HMO) | Admitting: Family Medicine

## 2014-07-16 ENCOUNTER — Ambulatory Visit (INDEPENDENT_AMBULATORY_CARE_PROVIDER_SITE_OTHER): Payer: Managed Care, Other (non HMO)

## 2014-07-16 ENCOUNTER — Encounter: Payer: Self-pay | Admitting: Family Medicine

## 2014-07-16 ENCOUNTER — Other Ambulatory Visit: Payer: Managed Care, Other (non HMO)

## 2014-07-16 VITALS — BP 114/72 | HR 79 | Ht 65.0 in | Wt 215.0 lb

## 2014-07-16 DIAGNOSIS — M87011 Idiopathic aseptic necrosis of right shoulder: Secondary | ICD-10-CM | POA: Diagnosis not present

## 2014-07-16 DIAGNOSIS — T380X5A Adverse effect of glucocorticoids and synthetic analogues, initial encounter: Secondary | ICD-10-CM

## 2014-07-16 DIAGNOSIS — M25511 Pain in right shoulder: Secondary | ICD-10-CM | POA: Diagnosis not present

## 2014-07-16 DIAGNOSIS — M87111 Osteonecrosis due to drugs, right shoulder: Secondary | ICD-10-CM

## 2014-07-16 NOTE — Progress Notes (Signed)
Pre visit review using our clinic review tool, if applicable. No additional management support is needed unless otherwise documented below in the visit note. 

## 2014-07-16 NOTE — Progress Notes (Signed)
[facility] [place], [place] [address] Phone: [phone] Subjective:      CC: Shoulder pain follow up [id number] Amanda [name] is a 64 y.o. female coming in with complaint of right shoulder pain. On February 10 patient did fall try to get herself with her right shoulder. Patient had significant amount of pain and thinks she did have a dislocation.  Patient's x-rays at that time did not show any significant bony abnormality at that time. Patient's on the low previously and was having upper extremity swelling and would patient's hospital history of breast cancer MRI was ordered. Patient's MRI does not show any metastasis but patient did have what appears to be a Bankhart lesion corresponding with a anterior dislocation as well as now severe degenerative joint disease patient has had a lot of stressors she has not been able to do the exercises and states the exercises seem to be very painful. Patient has started some of the over-the-counter natural supplementations.  history of breast cancer, status post masectomy, chemotherapy and radiation. Past pedicle history also significant for depression, osteoporosis, hypertension, and diabetes.  No past surgical history on file. History  Substance Use Topics  . Smoking status: Never Smoker   . Smokeless tobacco: Not on file  . Alcohol Use: Not on file   Allergies  Allergen Reactions  . Ace Inhibitors Cough  . Latex Itching  . Other Other (See Comments)    Freeze spray.  Marland Kitchen Penicillin V Potassium Rash   No family history on file. Denies any fevers chills or any abnormal weight loss recently.  Reviewed patient's MRI from outside facility. 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 Rotator cuff tear noted but no significant retraction at that time.  Past medical history, social, surgical and family  history all reviewed in electronic medical record.   Review of Systems: No headache, visual changes, nausea, vomiting, diarrhea, constipation, dizziness, abdominal pain, skin rash, fevers, chills, night sweats, weight loss, swollen lymph nodes, body aches, joint swelling, muscle aches, chest pain, shortness of breath, mood changes.   Objective Blood pressure 114/72, pulse 79, height 5\' 5"  (1.651 m), weight 215 lb (97.523 kg), SpO2 99 %.  General: No apparent distress alert and oriented x3 mood and affect normal, dressed appropriately.  HEENT: Pupils equal, extraocular movements intact  Respiratory: Patient's speak in full sentences and does not appear short of breath  Cardiovascular: No lower extremity edema, non tender, no erythema  Skin: Warm dry intact with no signs of infection or rash on extremities or on axial skeleton.  Abdomen: Soft nontender  Neuro: Cranial nerves II through XII are intact, neurovascularly intact in all extremities with 2+ DTRs and 2+ pulses.  Lymph: No lymphadenopathy of posterior or anterior cervical chain or axillae bilaterally.  Gait normal with good balance and coordination.  MSK:  Non tender with full range of motion and good stability and symmetric strength and tone of  elbows, wrist, hip, knee and ankles bilaterally.  Shoulder: right Inspection reveals mild high riding right shoulder compared to the contralateral sidepatient also has a new symptom +1 pitting edema of the upper extremity on the right side.Palpation is normal with no tenderness over AC joint or bicipital groove. Decreased range of motion lacking internal rotation to lateral hip 4+/ 5 strength compared to 5 out of 5 on the contralateral side signs of impingement with  positive Neer and Hawkin's tests, but negative empty can sign. Speeds and Yergason's tests normal. Normal scapular function observed. No painful arc and no drop arm sign. Mild apprehension Contralateral side  unremarkable.  Procedure: Real-time Ultrasound Guided Injection of right glenohumeral joint Device: GE Logiq E  Ultrasound guided injection is preferred based studies that show increased duration, increased effect, greater accuracy, decreased procedural pain, increased response rate with ultrasound guided versus blind injection.  Verbal informed consent obtained.  Time-out conducted.  Noted no overlying erythema, induration, or other signs of local infection.  Skin prepped in a sterile fashion.  Local anesthesia: Topical Ethyl chloride.  With sterile technique and under real time ultrasound guidance:  Joint visualized.  23g 1  inch needle inserted posterior approach. Pictures taken for needle placement. Patient did have injection of 2 cc of 1% lidocaine, 2 cc of 0.5% Marcaine, and 1.0 cc of Kenalog 40 mg/dL. Completed without difficulty  Pain immediately resolved suggesting accurate placement of the medication.  Advised to call if fevers/chills, erythema, induration, drainage, or persistent bleeding.  Images permanently stored and available for review in the ultrasound unit.  Impression: Technically successful ultrasound guided injection.    Impression and Recommendations:     This case required medical decision making of moderate complexity.

## 2014-07-16 NOTE — Patient Instructions (Addendum)
Good to see you Amanda [name] is your friend 20 minutes after activity and before bed.  Stay active.  Consider glucosamine 1500mg  daily.  Tylenol 500mg  3 times a day Continue the turmeric pennsaid pinkie amount topically 2 times daily as needed.  Physical therapy is up to you Send me a message in 2 weeks and tell me how you are doing Otherwise we can repeat injection every 3 months.

## 2014-07-16 NOTE — Assessment & Plan Note (Signed)
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 We discussed with patient as well as her husband about the prognosis of this. Patient does not have any significant limitation in range of motion at this time but does have crepitus as well as pain. Patient was given an injection today and tolerated the procedure very well. We discussed icing regimen and home exercises. Patient will continue formal physical therapy. We discussed over-the-counter medications and continuing the vitamin D supplementation. Patient would do more of an icing protocol. We discussed with patient checking again in 2 weeks. After that patient can repeat sterile injections every 2-3 weeks if necessary. We could consider viscous supplementation if patient also avoid surgical intervention if patient pain is not controlled with conservative therapy.  Spent  25 minutes with patient face-to-face and had greater than 50% of counseling including as described above in assessment and plan.

## 2014-07-22 ENCOUNTER — Other Ambulatory Visit: Payer: Self-pay | Admitting: *Deleted

## 2014-07-22 MED ORDER — VITAMIN D (ERGOCALCIFEROL) 1.25 MG (50000 UNIT) PO CAPS: 50000.0000 [IU] | ORAL_CAPSULE | ORAL | Status: DC

## 2014-07-22 NOTE — Telephone Encounter (Signed)
Refill done.  

## 2014-07-24 ENCOUNTER — Ambulatory Visit: Payer: Self-pay | Admitting: Oncology

## 2014-07-24 ENCOUNTER — Other Ambulatory Visit: Payer: Self-pay

## 2014-07-27 ENCOUNTER — Other Ambulatory Visit: Payer: Self-pay

## 2014-07-27 DIAGNOSIS — C50919 Malignant neoplasm of unspecified site of unspecified female breast: Secondary | ICD-10-CM

## 2014-07-27 DIAGNOSIS — C50912 Malignant neoplasm of unspecified site of left female breast: Secondary | ICD-10-CM | POA: Insufficient documentation

## 2014-07-28 ENCOUNTER — Inpatient Hospital Stay: Payer: Managed Care, Other (non HMO)

## 2014-07-28 ENCOUNTER — Encounter: Payer: Self-pay | Admitting: Oncology

## 2014-07-28 ENCOUNTER — Inpatient Hospital Stay: Payer: Managed Care, Other (non HMO) | Attending: Oncology | Admitting: Oncology

## 2014-07-28 VITALS — BP 113/88 | HR 118 | Temp 96.1°F | Resp 18 | Wt 215.2 lb

## 2014-07-28 DIAGNOSIS — C50912 Malignant neoplasm of unspecified site of left female breast: Secondary | ICD-10-CM

## 2014-07-28 DIAGNOSIS — M8580 Other specified disorders of bone density and structure, unspecified site: Secondary | ICD-10-CM | POA: Insufficient documentation

## 2014-07-28 DIAGNOSIS — E119 Type 2 diabetes mellitus without complications: Secondary | ICD-10-CM | POA: Insufficient documentation

## 2014-07-28 DIAGNOSIS — Z79899 Other long term (current) drug therapy: Secondary | ICD-10-CM | POA: Diagnosis not present

## 2014-07-28 DIAGNOSIS — Z7982 Long term (current) use of aspirin: Secondary | ICD-10-CM | POA: Insufficient documentation

## 2014-07-28 DIAGNOSIS — Z9010 Acquired absence of unspecified breast and nipple: Secondary | ICD-10-CM

## 2014-07-28 DIAGNOSIS — C50919 Malignant neoplasm of unspecified site of unspecified female breast: Secondary | ICD-10-CM

## 2014-07-28 DIAGNOSIS — E785 Hyperlipidemia, unspecified: Secondary | ICD-10-CM | POA: Diagnosis not present

## 2014-07-28 DIAGNOSIS — F329 Major depressive disorder, single episode, unspecified: Secondary | ICD-10-CM | POA: Diagnosis not present

## 2014-07-28 DIAGNOSIS — M7550 Bursitis of unspecified shoulder: Secondary | ICD-10-CM | POA: Insufficient documentation

## 2014-07-28 DIAGNOSIS — Z8669 Personal history of other diseases of the nervous system and sense organs: Secondary | ICD-10-CM | POA: Diagnosis not present

## 2014-07-28 DIAGNOSIS — Z803 Family history of malignant neoplasm of breast: Secondary | ICD-10-CM | POA: Diagnosis not present

## 2014-07-28 DIAGNOSIS — M1990 Unspecified osteoarthritis, unspecified site: Secondary | ICD-10-CM | POA: Diagnosis not present

## 2014-07-28 DIAGNOSIS — Z853 Personal history of malignant neoplasm of breast: Secondary | ICD-10-CM | POA: Insufficient documentation

## 2014-07-28 DIAGNOSIS — I10 Essential (primary) hypertension: Secondary | ICD-10-CM | POA: Diagnosis not present

## 2014-07-28 LAB — COMPREHENSIVE METABOLIC PANEL
ALK PHOS: 57 U/L (ref 38–126)
ALT: 27 U/L (ref 14–54)
ANION GAP: 7 (ref 5–15)
AST: 26 U/L (ref 15–41)
Albumin: 4.1 g/dL (ref 3.5–5.0)
BUN: 24 mg/dL — AB (ref 6–20)
CO2: 30 mmol/L (ref 22–32)
CREATININE: 1.05 mg/dL — AB (ref 0.44–1.00)
Calcium: 8.5 mg/dL — ABNORMAL LOW (ref 8.9–10.3)
Chloride: 102 mmol/L (ref 101–111)
GFR calc Af Amer: >60 mL/min (ref >60)
GFR calc non Af Amer: 56 mL/min — ABNORMAL LOW (ref >60)
Glucose, Bld: 168 mg/dL — ABNORMAL HIGH (ref 65–99)
Potassium: 4.3 mmol/L (ref 3.5–5.1)
Sodium: 139 mmol/L (ref 135–145)
Total Bilirubin: 0.4 mg/dL (ref 0.3–1.2)
Total Protein: 6.9 g/dL (ref 6.5–8.1)

## 2014-07-28 LAB — CBC
HEMATOCRIT: 39.6 % (ref 35.0–47.0)
Hemoglobin: 12.8 g/dL (ref 12.0–16.0)
MCH: 30.6 pg (ref 26.0–34.0)
MCHC: 32.4 g/dL (ref 32.0–36.0)
MCV: 94.4 fL (ref 80.0–100.0)
Platelets: 268 10*3/uL (ref 150–440)
RBC: 4.19 MIL/uL (ref 3.80–5.20)
RDW: 13.3 % (ref 11.5–14.5)
WBC: 4.8 10*3/uL (ref 3.6–11.0)

## 2014-07-29 LAB — CANCER ANTIGEN 27.29: CA 27.29: 11.8 U/mL (ref 0.0–38.6)

## 2014-07-30 ENCOUNTER — Other Ambulatory Visit: Payer: Self-pay | Admitting: Family Medicine

## 2014-07-30 ENCOUNTER — Telehealth: Payer: Self-pay | Admitting: *Deleted

## 2014-07-30 DIAGNOSIS — M75101 Unspecified rotator cuff tear or rupture of right shoulder, not specified as traumatic: Secondary | ICD-10-CM

## 2014-07-30 NOTE — Telephone Encounter (Signed)
Refill done.  

## 2014-07-30 NOTE — Telephone Encounter (Signed)
Spoke to pt, she stated that the cortisone injection did not help has much as she would have liked. Pt would like to try PT.  Referral entered.

## 2014-07-30 NOTE — Telephone Encounter (Signed)
Patient would like you to call her regarding her last shot

## 2014-08-06 ENCOUNTER — Ambulatory Visit: Payer: Managed Care, Other (non HMO)

## 2014-08-11 NOTE — Progress Notes (Signed)
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  INTERVAL HISTORY: Patient returns to clinic today for routine yearly evaluation.  She currently feels well and is asymptomatic. She has no neurologic complaints.  She has had no recent fevers or illnesses.  She denies any chest pain or shortness of breath.  She denies any nausea, vomiting, constipation, or diarrhea.  She has no urinary complaints.  Patient offers no specific complaints today.  REVIEW OF SYSTEMS:   Review of Systems  Constitutional: Negative.   Respiratory: Negative.   Cardiovascular: Negative.   Neurological: Negative.   Endo/Heme/Allergies: Negative.     As per HPI. Otherwise, a complete review of systems is negatve.  PAST MEDICAL HISTORY: Past Medical History  Diagnosis Date  . Depression   . Migraine   . Hypertension   . Diabetes   . Breast cancer   . Osteoarthritis of knee   . Hyperlipidemia     PAST SURGICAL HISTORY: Past Surgical History  Procedure Laterality Date  . Tubal ligation    . Knee arthroscopy    . Mastectomy      FAMILY HISTORY: Father with non-Hodgkin's lymphoma, 2 paternal aunts with breast cancer.     ADVANCED DIRECTIVES:    HEALTH MAINTENANCE: History  Substance Use Topics  . Smoking status: Never Smoker   . Smokeless tobacco: Not on file  . Alcohol Use: No     Colonoscopy:  PAP:  Bone density:  Lipid panel:  Allergies  Allergen Reactions  . Ace Inhibitors Cough  . Latex Itching  . Other Other (See Comments)    Freeze spray.  Marland Kitchen Penicillin V Potassium Rash    Current Outpatient Prescriptions  Medication Sig Dispense Refill  . alendronate (FOSAMAX) 70 MG tablet Take by mouth.    Marland Kitchen aspirin EC 81 MG tablet Take by  mouth.    Marland Kitchen atorvastatin (LIPITOR) 10 MG tablet Take by mouth.    Marland Kitchen buPROPion (WELLBUTRIN XL) 150 MG 24 hr tablet Take by mouth.    . Calcium Carbonate-Vit D-Min (CALCIUM 600+D PLUS MINERALS) 600-400 MG-UNIT TABS Take by mouth.    . fexofenadine (ALLEGRA) 180 MG tablet Take by mouth.    . gabapentin (NEURONTIN) 300 MG capsule Take 300 mg by mouth at bedtime.  1  . ibuprofen (ADVIL,MOTRIN) 200 MG tablet Take by mouth.    . losartan-hydrochlorothiazide (HYZAAR) 100-25 MG per tablet Take by mouth.    . metFORMIN (GLUCOPHAGE) 850 MG tablet Take by mouth.    Marland Kitchen PARoxetine (PAXIL) 40 MG tablet Take by mouth.    . Vitamin D, Ergocalciferol, (DRISDOL) 50000 UNITS CAPS capsule Take 1 capsule (50,000 Units total) by mouth every 7 (seven) days. 12 capsule 0  . zolpidem (AMBIEN) 5 MG tablet Take by mouth.    . Vitamin D, Ergocalciferol, (DRISDOL) 50000 UNITS CAPS capsule TAKE 1 CAPSULE (50,000 UNITS TOTAL) BY MOUTH EVERY 7 (SEVEN) DAYS. 8 capsule 0   No current facility-administered medications for this visit.    OBJECTIVE: Filed Vitals:   07/28/14 1207  BP: 113/88  Pulse: 118  Temp: 96.1 F (35.6 C)  Resp: 18     Body mass index is 35.81 kg/(m^2).    ECOG FS:0 - Asymptomatic  General: Well-developed, well-nourished, no acute distress. Eyes: Pink conjunctiva,  anicteric sclera. Breast: Left chest wall without evidence of recurrence.  Right breast and axilla without lumps or masses Lungs: Clear to auscultation bilaterally. Heart: Regular rate and rhythm. No rubs, murmurs, or gallops. Abdomen: Soft, nontender, nondistended. No organomegaly noted, normoactive bowel sounds. Musculoskeletal: No edema, cyanosis, or clubbing. Neuro: Alert, answering all questions appropriately. Cranial nerves grossly intact. Skin: No rashes or petechiae noted. Psych: Normal affect.   LAB RESULTS:  Lab Results  Component Value Date   NA 139 07/28/2014   K 4.3 07/28/2014   CL 102 07/28/2014   CO2 30 07/28/2014    GLUCOSE 168* 07/28/2014   BUN 24* 07/28/2014   CREATININE 1.05* 07/28/2014   CALCIUM 8.5* 07/28/2014   PROT 6.9 07/28/2014   ALBUMIN 4.1 07/28/2014   AST 26 07/28/2014   ALT 27 07/28/2014   ALKPHOS 57 07/28/2014   BILITOT 0.4 07/28/2014   GFRNONAA 56* 07/28/2014   GFRAA >60 07/28/2014    Lab Results  Component Value Date   WBC 4.8 07/28/2014   NEUTROABS 1.7 12/20/2011   HGB 12.8 07/28/2014   HCT 39.6 07/28/2014   MCV 94.4 07/28/2014   PLT 268 07/28/2014     STUDIES: Mr Shoulder Right Wo Contrast  07/14/2014   CLINICAL DATA:  Status post fall February, 2016 with a right shoulder injury. Continued pain.  EXAM: MRI OF THE RIGHT SHOULDER WITHOUT CONTRAST  TECHNIQUE: Multiplanar, multisequence MR imaging of the shoulder was performed. No intravenous contrast was administered.  COMPARISON:  Plain films right shoulder 07/06/2014.  FINDINGS: Rotator cuff: There is supraspinatus worse than infraspinatus and subscapularis tendinopathy. A small interstitial tear in the posterior supraspinatus measures 0.4 cm from front to back. No full-thickness tear or retracted tendon is present.  Muscles:  Normal in appearance without atrophy or focal lesion.  Biceps long head: Intrasubstance increased T2 signal is seen in the intra-articular segment of the tendon consistent with tendinosis but no tear is identified.  Acromioclavicular Joint:  Moderate degenerative change is seen.  Glenohumeral Joint: Hyaline cartilage of the glenohumeral joint appears completely denuded. Mild posterior subluxation of the humeral head on the glenoid is identified.  Labrum:  The labrum is circumferentially torn from the glenoid.  Bones: There is marked marrow edema about the glenohumeral joint. Flattening of the posterior, lateral humeral head is most consistent with a Hill-Sachs deformity. The anterior, inferior glenoid appears distorted and somewhat hypertrophied. While this cannot be definitively characterized, is likely due  to subacute bony Bankart.  IMPRESSION: Findings highly suggestive of prior anterior shoulder dislocation without associated Hill-Sachs lesion and healing bony Bankart. The articular surface the glenoid is distorted. If indicated, CT scan of the shoulder would be useful for further evaluation.  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  Rotator cuff and long head of biceps tendinopathy. Small interstitial tear in the posterior supraspinatus is identified but there is no full-thickness tear or retracted tendon.  Subacromial/subdeltoid bursitis.   Electronically Signed   By: Inge Rise M.D.   On: 07/14/2014 09:23   [place] Extrem Up Right Ltd  07/20/2014   Procedure: Real-time Ultrasound Guided Injection of right glenohumeral  joint Device: GE Logiq E  Ultrasound guided injection is preferred based studies that show increased  duration, increased effect, greater accuracy, decreased procedural pain,  increased response rate with ultrasound guided versus blind injection.  Verbal informed consent obtained.  Time-out conducted.  Noted no overlying erythema, induration, or other signs of local  infection.  Skin prepped in a  sterile fashion.  Local anesthesia: Topical Ethyl chloride.  With sterile technique and under real time ultrasound guidance: Joint  visualized. 23g 1  inch needle inserted posterior approach. Pictures  taken for needle placement. Patient did have injection of 2 cc of 1%  lidocaine, 2 cc of 0.5% Marcaine, and 1.0 cc of Kenalog 40 mg/dL. Completed without difficulty  Pain immediately resolved suggesting accurate placement of the medication.   Advised to call if fevers/chills, erythema, induration, drainage, or  persistent bleeding.  Images permanently stored and available for review in the ultrasound unit.   Impression: Technically successful ultrasound guided injection.   ASSESSMENT:  ER/PR positive, HER-2 negative stage III inflammatory left breast  carcinoma.  PLAN:    1.  Breast cancer: No evidence of disease.  Patient completed 5 years of Arimidex in June of 2015.  Patient's most recent mammogram on November 14, 2013 was reported as BI-RADS 1, repeat in November 2016.  Return to clinic in 1 year for routine evaluation.  Patient understands she can return to clinic at any time if she has any questions, concerns, or complaints. 2.  Osteopenia: Patient's bone mineral density from December 24, 2012 reported a T score of -1.2.  Continue Fosamax, calcium, and vitamin D.  Patient expressed understanding and was in agreement with this plan. She also understands that She can call clinic at any time with any questions, concerns, or complaints.   Breast cancer   Staging form: Breast, AJCC 7th Edition     Pathologic stage from 08/11/2014: Stage IIIA (T0, N2a, cM0) - Signed by Lloyd Huger, MD on 08/11/2014   Lloyd Huger, MD   08/11/2014 6:02 PM

## 2014-10-13 ENCOUNTER — Telehealth: Payer: Self-pay | Admitting: Family Medicine

## 2014-10-13 NOTE — Telephone Encounter (Signed)
disregard

## 2014-10-14 ENCOUNTER — Other Ambulatory Visit (INDEPENDENT_AMBULATORY_CARE_PROVIDER_SITE_OTHER): Payer: Managed Care, Other (non HMO)

## 2014-10-14 ENCOUNTER — Ambulatory Visit (INDEPENDENT_AMBULATORY_CARE_PROVIDER_SITE_OTHER): Payer: Managed Care, Other (non HMO) | Admitting: Family Medicine

## 2014-10-14 ENCOUNTER — Encounter: Payer: Self-pay | Admitting: Family Medicine

## 2014-10-14 VITALS — BP 120/80 | HR 82 | Wt 211.0 lb

## 2014-10-14 DIAGNOSIS — M25511 Pain in right shoulder: Secondary | ICD-10-CM

## 2014-10-14 DIAGNOSIS — M87011 Idiopathic aseptic necrosis of right shoulder: Secondary | ICD-10-CM

## 2014-10-14 DIAGNOSIS — T380X5A Adverse effect of glucocorticoids and synthetic analogues, initial encounter: Secondary | ICD-10-CM

## 2014-10-14 DIAGNOSIS — M87111 Osteonecrosis due to drugs, right shoulder: Secondary | ICD-10-CM

## 2014-10-14 MED ORDER — VITAMIN D (ERGOCALCIFEROL) 1.25 MG (50000 UNIT) PO CAPS: 50000.0000 [IU] | ORAL_CAPSULE | ORAL | Status: DC

## 2014-10-14 NOTE — Progress Notes (Signed)
[facility] [place], [place] [address] Phone: [phone] Subjective:      CC: Shoulder pain follow up [id number] Amanda [name] is a 64 y.o. female coming in with complaint of right shoulder pain. On February 10 patient did fall try to get herself with her right shoulder. Patient had significant amount of pain and thinks she did have a dislocation.  Patient's x-rays at that time did not show any significant bony abnormality at that time. Patient's on the low previously and was having upper extremity swelling and would patient's hospital history of breast cancer MRI was ordered. Patient's MRI does not show any metastasis but patient did have what appears to be a Bankhart lesion corresponding with a anterior dislocation as well as now severe degenerative joint disease.  Patient elected have an injection 3 months ago. Patient continue with conservative therapy trying to do the home exercises more religiously. Patient states overall she was doing very well but unfortunate is now having some worsening pain. Injection 3 months ago was doing great. No new symptoms is worsening a previous symptoms.   history of breast cancer, status post masectomy, chemotherapy and radiation. Past medical history also significant for depression, osteoporosis, hypertension, and diabetes.  Past Surgical History  Procedure Laterality Date  . Tubal ligation    . Knee arthroscopy    . Mastectomy     Social History  Substance Use Topics  . Smoking status: Never Smoker   . Smokeless tobacco: None  . Alcohol Use: No   Allergies  Allergen Reactions  . Ace Inhibitors Cough  . Latex Itching  . Other Other (See Comments)    Freeze spray.  Marland Kitchen Penicillin V Potassium Rash   No family history on file. Denies any fevers chills or any abnormal weight loss recently.  Reviewed patient's MRI from outside facility. 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 Rotator cuff tear noted but no significant retraction at that time.  Past medical history, social, surgical and family history all reviewed in electronic medical record.   Review of Systems: No headache, visual changes, nausea, vomiting, diarrhea, constipation, dizziness, abdominal pain, skin rash, fevers, chills, night sweats, weight loss, swollen lymph nodes, body aches, joint swelling, muscle aches, chest pain, shortness of breath, mood changes.   Objective Blood pressure 120/80, pulse 82, weight 211 lb (95.709 kg), SpO2 95 %.  General: No apparent distress alert and oriented x3 mood and affect normal, dressed appropriately.  HEENT: Pupils equal, extraocular movements intact  Respiratory: Patient's speak in full sentences and does not appear short of breath  Cardiovascular: No lower extremity edema, non tender, no erythema  Skin: Warm dry intact with no signs of infection or rash on extremities or on axial skeleton.  Abdomen: Soft nontender  Neuro: Cranial nerves II through XII are intact, neurovascularly intact in all extremities with 2+ DTRs and 2+ pulses.  Lymph: No lymphadenopathy of posterior or anterior cervical chain or axillae bilaterally.  Gait normal with good balance and coordination.  MSK:  Non tender with full range of motion and good stability and symmetric strength and tone of  elbows, wrist, hip, knee and ankles bilaterally.  Shoulder: right Inspection is unremarkable Moderately tender over the glenohumeral joint anterior-posterior Decreased range of motion lacking internal rotation to lateral hip 4+/ 5 strength compared to 5 out of 5 on the contralateral side signs  of impingement with positive Neer and Hawkin's tests, but negative empty can sign.no worsening from previous exam Speeds and Yergason's tests normal. Normal scapular function observed. No painful arc and no drop arm sign. Mild  apprehension Contralateral side unremarkable.  Procedure: Real-time Ultrasound Guided Injection of right glenohumeral joint Device: GE Logiq E  Ultrasound guided injection is preferred based studies that show increased duration, increased effect, greater accuracy, decreased procedural pain, increased response rate with ultrasound guided versus blind injection.  Verbal informed consent obtained.  Time-out conducted.  Noted no overlying erythema, induration, or other signs of local infection.  Skin prepped in a sterile fashion.  Local anesthesia: Topical Ethyl chloride.  With sterile technique and under real time ultrasound guidance:  Joint visualized.  23g 1  inch needle inserted posterior approach. Pictures taken for needle placement. Patient did have injection of 2 cc of 1% lidocaine, 2 cc of 0.5% Marcaine, and 1.0 cc of Kenalog 40 mg/dL. Completed without difficulty  Pain immediately resolved suggesting accurate placement of the medication.  Advised to call if fevers/chills, erythema, induration, drainage, or persistent bleeding.  Images permanently stored and available for review in the ultrasound unit.  Impression: Technically successful ultrasound guided injection.    Impression and Recommendations:     This case required medical decision making of moderate complexity.

## 2014-10-14 NOTE — Assessment & Plan Note (Signed)
With patient's continuing to have discomfort we did do another injection. Patient does have avascular necrosis but I do not know if it is steroid induced more likely posttraumatic. Patient will continue the home exercises. Patient has done formal physical therapy previously. Continue with the icing. Patient is still able to do daily activities without significant discomfort. Patient follow-up with me on an as-needed basis no he we can repeat the injection every 3-4 months. If the injection does not last that long she would need possible shoulder replacement.

## 2014-10-14 NOTE — Patient Instructions (Addendum)
Good to see you  Refilled your vitamin D Ice is your friend Tyr to do exercises 2 times a day Tylenol can help See me when you need me.

## 2014-10-15 ENCOUNTER — Telehealth: Payer: Self-pay | Admitting: Family Medicine

## 2014-10-15 MED ORDER — VITAMIN D (ERGOCALCIFEROL) 1.25 MG (50000 UNIT) PO CAPS: 50000.0000 [IU] | ORAL_CAPSULE | ORAL | Status: DC

## 2014-10-15 NOTE — Telephone Encounter (Signed)
Patient states her prescription for Vitamin D, Ergocalciferol, (DRISDOL) 50000 UNITS CAPS capsule [[id number] Needs to be sent to the [facility]

## 2014-10-15 NOTE — Telephone Encounter (Signed)
Resent rx into correct pharmacy. 

## 2014-11-20 ENCOUNTER — Telehealth: Payer: Self-pay | Admitting: Oncology

## 2014-11-20 NOTE — Telephone Encounter (Signed)
Amanda [name] is not in network for her so she would like to do mammo at [facility]. She was originally scheduled for 11/22 and they said they could do it that day as well. If you fax over order they will call pt to schedule. She also said she needs a refill on her Alendronate (Foxamax generic) Rx. She said that her PCP told her it might not be helping her anymore bc he said it loses effectiveness the longer someone takes it and she's been taking it 7 years. She wants you to call her to advise about that. Thanks. [phone] ALSO: The fax # for [facility] is: [phone]. Thanks!

## 2014-11-20 NOTE — Telephone Encounter (Signed)
Should she continue Fosamax?

## 2014-11-23 NOTE — Telephone Encounter (Signed)
Order faxed to [facility], attempted to call patient inform her to stop Fosamax.

## 2014-11-23 NOTE — Telephone Encounter (Signed)
Ok to discontinue 

## 2014-12-08 ENCOUNTER — Ambulatory Visit: Payer: Managed Care, Other (non HMO)

## 2015-01-13 ENCOUNTER — Ambulatory Visit: Payer: Managed Care, Other (non HMO) | Admitting: Family Medicine

## 2015-01-13 ENCOUNTER — Ambulatory Visit (INDEPENDENT_AMBULATORY_CARE_PROVIDER_SITE_OTHER): Payer: Managed Care, Other (non HMO) | Admitting: Family Medicine

## 2015-01-13 ENCOUNTER — Other Ambulatory Visit (INDEPENDENT_AMBULATORY_CARE_PROVIDER_SITE_OTHER): Payer: Managed Care, Other (non HMO)

## 2015-01-13 ENCOUNTER — Encounter: Payer: Self-pay | Admitting: Family Medicine

## 2015-01-13 VITALS — BP 120/76 | HR 82 | Ht 65.0 in | Wt 216.0 lb

## 2015-01-13 DIAGNOSIS — M25511 Pain in right shoulder: Secondary | ICD-10-CM

## 2015-01-13 DIAGNOSIS — M19011 Primary osteoarthritis, right shoulder: Secondary | ICD-10-CM | POA: Insufficient documentation

## 2015-01-13 NOTE — Patient Instructions (Signed)
Good to see you Ice is your friend  Keep trucking a long Can repeat every 3 months or if you decide we can refer you to Dr. Tamera Punt to discuss surgical options.  Happy New Year!

## 2015-01-13 NOTE — Assessment & Plan Note (Signed)
Given an injection again today. I do feel that patient did get avascular necrosis likely from the shoulder subluxation versus dislocation. Addition of this patient's history of breast cancer and radiation could also contributed.  Patient will continue with conservative therapy at this time. As long as patient can continue to do well every 3 monthswe can repeat injection. We discussed that patient will likely need surgical intervention in a shoulder replacement at some point. Patient will consider she wants referral and will  call us. Spent  25 minutes with patient face-to-face and had greater than 50% of counseling including as described above in assessment and plan.

## 2015-01-13 NOTE — Progress Notes (Signed)
[name] [place], [place] [address] Phone: [phone] Subjective:      CC: Shoulder pain follow up [id number] Amanda [name] is a 64 y.o. female coming in with complaint of right shoulder pain. On February 10 patient did fall try to get herself with her right shoulder. Patient had significant amount of pain and thinks she did have a dislocation.  Patient's x-rays at that time did not show any significant bony abnormality at that time. Patient's on the low previously and was having upper extremity swelling and would patient's hospital history of breast cancer MRI was ordered. Patient's MRI does not show any metastasis but patient did have what appears to be a Bankhart lesion corresponding with a anterior dislocation as well as now severe degenerative joint disease.  Patient elected have an injection 3 months ago. Patient has continued on conservative therapy for quite some time. Patient continues to have difficulty.patient states that she did have about 6-8 weeks of relief with the injection at last exam. States that now it seems to be worsening in. Describes it as a dull, throbbing aching sensation. Starting to limit some of her activity makes it even difficult to do such things as dressing herself. Starting wake her up at night as well. No numbness or weakness of the lower part of the arm.   history of breast cancer, status post masectomy, chemotherapy and radiation. Past medical history also significant for depression, osteoporosis, hypertension, and diabetes.  Past Surgical History  Procedure Laterality Date  . Tubal ligation    . Knee arthroscopy    . Mastectomy     Social History  Substance Use Topics  . Smoking status: Never Smoker   . Smokeless tobacco: None  . Alcohol Use: No   Allergies  Allergen Reactions  . Ace Inhibitors Cough  . Latex Itching  . Other Other (See Comments)    Freeze spray.  Marland Kitchen Penicillin V Potassium Rash    No family history on file.no family history that is pertinent to chief complaint Denies any fevers chills or any abnormal weight loss recently.  Reviewed patient's MRI from outside facility. 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 Rotator cuff tear noted but no significant retraction at that time.  Past medical history, social, surgical and family history all reviewed in electronic medical record.   Review of Systems: No headache, visual changes, nausea, vomiting, diarrhea, constipation, dizziness, abdominal pain, skin rash, fevers, chills, night sweats, weight loss, swollen lymph nodes, body aches, joint swelling, muscle aches, chest pain, shortness of breath, mood changes.   Objective Blood pressure 120/76, pulse 82, height 5\' 5"  (1.651 m), weight 216 lb (97.977 kg), SpO2 97 %.  General: No apparent distress alert and oriented x3 mood and affect normal, dressed appropriately.  HEENT: Pupils equal, extraocular movements intact  Respiratory: Patient's speak in full sentences and does not appear short of breath  Cardiovascular: No lower extremity edema, non tender, no erythema  Skin: Warm dry intact with no signs of infection or rash on extremities or on axial skeleton.  Abdomen: Soft nontender  Neuro: Cranial nerves II through XII are intact, neurovascularly intact in all extremities with 2+ DTRs and 2+ pulses.  Lymph: No lymphadenopathy of posterior or anterior cervical chain or axillae bilaterally.  Gait normal with good balance and coordination.  MSK:  Non tender with full range of motion  and good stability and symmetric strength and tone of  elbows, wrist, hip, knee and ankles bilaterally.  Shoulder: right Inspection is unremarkable Moderately tender over the glenohumeral joint anterior-posterior Decreased range of motion lacking internal rotation to lateral hip 4+/ 5 strength compared  to 5 out of 5 on the contralateral side signs of impingement with positive Neer and Hawkin's tests, but negative empty can sign.no worsening from previous exam Speeds and Yergason's tests normal. Normal scapular function observed. No painful arc and no drop arm sign. Mild apprehension Contralateral side unremarkable. Same as previous exam   Procedure: Real-time Ultrasound Guided Injection of right glenohumeral joint Device: GE Logiq E  Ultrasound guided injection is preferred based studies that show increased duration, increased effect, greater accuracy, decreased procedural pain, increased response rate with ultrasound guided versus blind injection.  Verbal informed consent obtained.  Time-out conducted.  Noted no overlying erythema, induration, or other signs of local infection.  Skin prepped in a sterile fashion.  Local anesthesia: Topical Ethyl chloride.  With sterile technique and under real time ultrasound guidance:  Joint visualized.  23g 1  inch needle inserted posterior approach. Pictures taken for needle placement. Patient did have injection of 2 cc of 1% lidocaine, 2 cc of 0.5% Marcaine, and 1.0 cc of Kenalog 40 mg/dL. Completed without difficulty  Pain immediately resolved suggesting accurate placement of the medication.  Advised to call if fevers/chills, erythema, induration, drainage, or persistent bleeding.  Images permanently stored and available for review in the ultrasound unit.  Impression: Technically successful ultrasound guided injection.    Impression and Recommendations:     This case required medical decision making of moderate complexity.

## 2015-01-13 NOTE — Progress Notes (Signed)
Pre visit review using our clinic review tool, if applicable. No additional management support is needed unless otherwise documented below in the visit note. 

## 2015-02-15 ENCOUNTER — Encounter: Payer: Self-pay | Admitting: Family Medicine

## 2015-02-23 ENCOUNTER — Encounter: Payer: Self-pay | Admitting: Family Medicine

## 2015-02-23 ENCOUNTER — Ambulatory Visit (INDEPENDENT_AMBULATORY_CARE_PROVIDER_SITE_OTHER): Payer: Managed Care, Other (non HMO) | Admitting: Family Medicine

## 2015-02-23 VITALS — BP 136/80 | HR 86 | Wt 217.0 lb

## 2015-02-23 DIAGNOSIS — M87111 Osteonecrosis due to drugs, right shoulder: Secondary | ICD-10-CM | POA: Diagnosis not present

## 2015-02-23 DIAGNOSIS — M19011 Primary osteoarthritis, right shoulder: Secondary | ICD-10-CM

## 2015-02-23 DIAGNOSIS — T380X1A Poisoning by glucocorticoids and synthetic analogues, accidental (unintentional), initial encounter: Secondary | ICD-10-CM | POA: Diagnosis not present

## 2015-02-23 DIAGNOSIS — T380X5A Adverse effect of glucocorticoids and synthetic analogues, initial encounter: Secondary | ICD-10-CM

## 2015-02-23 NOTE — Assessment & Plan Note (Signed)
Concerned with patient at this time. Patient is failed all other conservative therapy. We did discuss we can repeat injection again in approximately 3 weeks' time. Patient though is wondering to have more final improvement with patient having the chronic pain that is affecting daily activities. Patient will be referred to orthopedic surgery to discuss her surgical options. Told her due to the severity of the arthritic changes that she likely would need possible shoulder replacement. Patient is willing to do this if it would be helpful. Referred today. Continue all other medications. Here if any questions arise.  Spent  25 minutes with patient face-to-face and had greater than 50% of counseling including as described above in assessment and plan.

## 2015-02-23 NOTE — Progress Notes (Signed)
[name] [place], [place] [address] Phone: [phone] Subjective:      CC: Shoulder pain follow up [id number] Amanda [name] is a 65 y.o. female coming in with complaint of right shoulder pain. On February 10 patient did fall try to get herself with her right shoulder. Patient had significant amount of pain and thinks she did have a dislocation.  Patient's x-rays at that time did not show any significant bony abnormality at that time. Patient's on the low previously and was having upper extremity swelling and would patient's hospital history of breast cancer MRI was ordered. Patient's MRI does not show any metastasis but patient did have what appears to be a Bankhart lesion corresponding with a anterior dislocation as well as now severe degenerative joint disease.   Patient was given an injection recently. Patient was doing much better but then had to gets glue off of the floor and had to do a lot of repetitive motion. Pain in his back as bad as it was previously. Having significant difficulty when she is unable to do some daily activities. States that it is waking her up at night. Patient states it is affecting all daily activities. Have failed all other conservative therapy at this time.   history of breast cancer, status post masectomy, chemotherapy and radiation. Past medical history also significant for depression, osteoporosis, hypertension, and diabetes.  Past Surgical History  Procedure Laterality Date  . Tubal ligation    . Knee arthroscopy    . Mastectomy     Social History  Substance Use Topics  . Smoking status: Never Smoker   . Smokeless tobacco: None  . Alcohol Use: No   Allergies  Allergen Reactions  . Ace Inhibitors Cough  . Latex Itching  . Other Other (See Comments)    Freeze spray.  Marland Kitchen Penicillin V Potassium Rash   No family history on file.no family history that is pertinent to chief complaint Denies any  fevers chills or any abnormal weight loss recently.  Reviewed patient's MRI from outside facility. 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 Rotator cuff tear noted but no significant retraction at that time.  Past medical history, social, surgical and family history all reviewed in electronic medical record.   Review of Systems: No headache, visual changes, nausea, vomiting, diarrhea, constipation, dizziness, abdominal pain, skin rash, fevers, chills, night sweats, weight loss, swollen lymph nodes, body aches, joint swelling, muscle aches, chest pain, shortness of breath, mood changes.   Objective Blood pressure 136/80, pulse 86, weight 217 lb (98.431 kg), SpO2 98 %.  General: No apparent distress alert and oriented x3 mood and affect normal, dressed appropriately.  HEENT: Pupils equal, extraocular movements intact  Respiratory: Patient's speak in full sentences and does not appear short of breath  Cardiovascular: No lower extremity edema, non tender, no erythema  Skin: Warm dry intact with no signs of infection or rash on extremities or on axial skeleton.  Abdomen: Soft nontender  Neuro: Cranial nerves II through XII are intact, neurovascularly intact in all extremities with 2+ DTRs and 2+ pulses.  Lymph: No lymphadenopathy of posterior or anterior cervical chain or axillae bilaterally.  Gait normal with good balance and coordination.  MSK:  Non tender with full range of motion and good stability and symmetric strength and tone of  elbows, wrist, hip, knee and ankles bilaterally.  Shoulder:  right Inspection is unremarkable Moderately tender over the glenohumeral joint anterior-posterior Decreased range of motion lacking internal rotation to lateral hip and down decreased external range of motion with crepitus noted. 4/ 5 strength compared to 5 out of 5 on the contralateral side signs of  impingement with positive Neer and Hawkin's tests, this is worse than previous exam Speeds and Yergason's tests normal.o Normal scapular function observed. No painful arc and no drop arm sign. Mild apprehension Contralateral side unremarkable. Worsening exam.      Impression and Recommendations:     This case required medical decision making of moderate complexity.

## 2015-02-23 NOTE — Patient Instructions (Signed)
Good to see you  Ice is your friend, do it every 2  ohours for 20 minutes the next 2 days Keep it moving but maybe not with hard labor.  OCntinue the other medicines Dr. Bettina Gavia office will be calling you Otherwise can see me again in 4 weeks if you would like to do another injection.

## 2015-02-23 NOTE — Progress Notes (Signed)
Pre visit review using our clinic review tool, if applicable. No additional management support is needed unless otherwise documented below in the visit note. 

## 2015-03-03 ENCOUNTER — Other Ambulatory Visit: Payer: Self-pay | Admitting: Orthopedic Surgery

## 2015-03-03 DIAGNOSIS — M19111 Post-traumatic osteoarthritis, right shoulder: Secondary | ICD-10-CM

## 2015-03-04 ENCOUNTER — Ambulatory Visit
Admission: RE | Admit: 2015-03-04 | Discharge: 2015-03-04 | Disposition: A | Discharge status: Home / Self Care | Payer: Managed Care, Other (non HMO) | Source: Ambulatory Visit | Attending: Orthopedic Surgery | Admitting: Orthopedic Surgery

## 2015-03-04 DIAGNOSIS — M19111 Post-traumatic osteoarthritis, right shoulder: Secondary | ICD-10-CM

## 2015-04-21 ENCOUNTER — Other Ambulatory Visit: Payer: Self-pay | Admitting: Orthopedic Surgery

## 2015-05-26 ENCOUNTER — Encounter (HOSPITAL_COMMUNITY): Payer: Self-pay

## 2015-05-26 ENCOUNTER — Encounter (HOSPITAL_COMMUNITY)
Admission: RE | Admit: 2015-05-26 | Discharge: 2015-05-26 | Disposition: A | Discharge status: Home / Self Care | Payer: Managed Care, Other (non HMO) | Source: Ambulatory Visit | Attending: Orthopedic Surgery | Admitting: Orthopedic Surgery

## 2015-05-26 DIAGNOSIS — Z01818 Encounter for other preprocedural examination: Secondary | ICD-10-CM | POA: Insufficient documentation

## 2015-05-26 DIAGNOSIS — Z01812 Encounter for preprocedural laboratory examination: Secondary | ICD-10-CM | POA: Diagnosis not present

## 2015-05-26 DIAGNOSIS — Z96652 Presence of left artificial knee joint: Secondary | ICD-10-CM | POA: Diagnosis not present

## 2015-05-26 DIAGNOSIS — M19011 Primary osteoarthritis, right shoulder: Secondary | ICD-10-CM | POA: Diagnosis not present

## 2015-05-26 DIAGNOSIS — E785 Hyperlipidemia, unspecified: Secondary | ICD-10-CM | POA: Insufficient documentation

## 2015-05-26 DIAGNOSIS — I10 Essential (primary) hypertension: Secondary | ICD-10-CM | POA: Insufficient documentation

## 2015-05-26 DIAGNOSIS — Z9012 Acquired absence of left breast and nipple: Secondary | ICD-10-CM | POA: Diagnosis not present

## 2015-05-26 DIAGNOSIS — F329 Major depressive disorder, single episode, unspecified: Secondary | ICD-10-CM | POA: Diagnosis not present

## 2015-05-26 DIAGNOSIS — I4510 Unspecified right bundle-branch block: Secondary | ICD-10-CM | POA: Diagnosis not present

## 2015-05-26 DIAGNOSIS — C50912 Malignant neoplasm of unspecified site of left female breast: Secondary | ICD-10-CM | POA: Diagnosis not present

## 2015-05-26 DIAGNOSIS — R9431 Abnormal electrocardiogram [ECG] [EKG]: Secondary | ICD-10-CM | POA: Diagnosis not present

## 2015-05-26 DIAGNOSIS — Z7984 Long term (current) use of oral hypoglycemic drugs: Secondary | ICD-10-CM | POA: Insufficient documentation

## 2015-05-26 DIAGNOSIS — Z7982 Long term (current) use of aspirin: Secondary | ICD-10-CM | POA: Insufficient documentation

## 2015-05-26 DIAGNOSIS — Z79899 Other long term (current) drug therapy: Secondary | ICD-10-CM | POA: Diagnosis not present

## 2015-05-26 DIAGNOSIS — E119 Type 2 diabetes mellitus without complications: Secondary | ICD-10-CM | POA: Insufficient documentation

## 2015-05-26 HISTORY — DX: Other specified postprocedural states: Z98.890

## 2015-05-26 HISTORY — DX: Emotional lability: R45.86

## 2015-05-26 HISTORY — DX: Other specified postprocedural states: R11.2

## 2015-05-26 HISTORY — DX: Pain in unspecified joint: M25.50

## 2015-05-26 HISTORY — DX: Insomnia, unspecified: G47.00

## 2015-05-26 HISTORY — DX: Other seasonal allergic rhinitis: J30.2

## 2015-05-26 HISTORY — DX: Personal history of other diseases of the respiratory system: Z87.09

## 2015-05-26 HISTORY — DX: Dorsalgia, unspecified: M54.9

## 2015-05-26 LAB — COMPREHENSIVE METABOLIC PANEL
ALK PHOS: 60 U/L (ref 38–126)
ALT: 29 U/L (ref 14–54)
ANION GAP: 10 (ref 5–15)
AST: 34 U/L (ref 15–41)
Albumin: 3.9 g/dL (ref 3.5–5.0)
BUN: 16 mg/dL (ref 6–20)
CALCIUM: 9.9 mg/dL (ref 8.9–10.3)
CO2: 28 mmol/L (ref 22–32)
Chloride: 102 mmol/L (ref 101–111)
Creatinine, Ser: 0.89 mg/dL (ref 0.44–1.00)
GFR calc non Af Amer: >60 mL/min (ref >60)
Glucose, Bld: 115 mg/dL — ABNORMAL HIGH (ref 65–99)
Potassium: 4.4 mmol/L (ref 3.5–5.1)
SODIUM: 140 mmol/L (ref 135–145)
Total Bilirubin: 0.5 mg/dL (ref 0.3–1.2)
Total Protein: 6.5 g/dL (ref 6.5–8.1)

## 2015-05-26 LAB — URINALYSIS, ROUTINE W REFLEX MICROSCOPIC
BILIRUBIN URINE: NEGATIVE
Glucose, UA: NEGATIVE mg/dL
HGB URINE DIPSTICK: NEGATIVE
KETONES UR: NEGATIVE mg/dL
Nitrite: NEGATIVE
PROTEIN: NEGATIVE mg/dL
Specific Gravity, Urine: 1.018 (ref 1.005–1.030)
pH: 8 (ref 5.0–8.0)

## 2015-05-26 LAB — CBC WITH DIFFERENTIAL/PLATELET
Basophils Absolute: 0.1 10*3/uL (ref 0.0–0.1)
Basophils Relative: 1 %
EOS ABS: 0.3 10*3/uL (ref 0.0–0.7)
EOS PCT: 6 %
HCT: 41.9 % (ref 36.0–46.0)
HEMOGLOBIN: 13.7 g/dL (ref 12.0–15.0)
LYMPHS ABS: 1.8 10*3/uL (ref 0.7–4.0)
Lymphocytes Relative: 30 %
MCH: 31.1 pg (ref 26.0–34.0)
MCHC: 32.7 g/dL (ref 30.0–36.0)
MCV: 95 fL (ref 78.0–100.0)
MONOS PCT: 13 %
Monocytes Absolute: 0.8 10*3/uL (ref 0.1–1.0)
Neutro Abs: 3.1 10*3/uL (ref 1.7–7.7)
Neutrophils Relative %: 50 %
PLATELETS: 297 10*3/uL (ref 150–400)
RBC: 4.41 MIL/uL (ref 3.87–5.11)
RDW: 12.4 % (ref 11.5–15.5)
WBC: 6.1 10*3/uL (ref 4.0–10.5)

## 2015-05-26 LAB — URINE MICROSCOPIC-ADD ON

## 2015-05-26 LAB — SURGICAL PCR SCREEN
MRSA, PCR: NEGATIVE
Staphylococcus aureus: NEGATIVE

## 2015-05-26 LAB — PROTIME-INR
INR: 0.95 (ref 0.00–1.49)
PROTHROMBIN TIME: 12.9 s (ref 11.6–15.2)

## 2015-05-26 LAB — GLUCOSE, CAPILLARY: Glucose-Capillary: 108 mg/dL — ABNORMAL HIGH (ref 65–99)

## 2015-05-26 LAB — APTT: aPTT: 27 seconds (ref 24–37)

## 2015-05-26 MED ORDER — POVIDONE-IODINE 7.5 % EX SOLN
Freq: Once | CUTANEOUS | Status: DC
Start: 2015-05-26 — End: 2015-05-27

## 2015-05-26 NOTE — Pre-Procedure Instructions (Signed)
KAMREIGH MOCARSKI  05/26/2015      [id number], [place] - [address] [place] [address] Phone: [phone] Fax: [phone]  [place], [place] - [address] [place] [address] Phone: [phone] Fax: [phone]    Your procedure is scheduled on Thurs, May 18 @ 7:30 AM  Report to [facility] Admitting at 5:30 AM  Call this number if you have problems the morning of surgery:  [phone]   Remember:  Do not eat food or drink liquids after midnight.  Take these medicines the morning of surgery with A SIP OF WATER Wellbutrin(Bupropion),Allegra(Fexofenadine), and Paxil(Paroxetine)              Stop taking your Aspirin along with any Vitamins or Herbal Medications. No Goody's,BC's,Aleve,Advil,Motrin,Ibuprofen,or Fish Oil.    Do not wear jewelry, make-up or nail polish.  Do not wear lotions, powders, or perfumes.    Do not shave 48 hours prior to surgery.    Do not bring valuables to the hospital.  [facility] is not responsible for any belongings or valuables.  Contacts, dentures or bridgework may not be worn into surgery.  Leave your suitcase in the car.  After surgery it may be brought to your room.  For patients admitted to the hospital, discharge time will be determined by your treatment team.  Patients discharged the day of surgery will not be allowed to drive home.    Special instructions:  [place] - Preparing for Surgery  Before surgery, you can play an important role.  Because skin is not sterile, your skin needs to be as free of germs as possible.  You can reduce the number of germs on you skin by washing with CHG (chlorahexidine gluconate) soap before surgery.  CHG is an antiseptic cleaner which kills germs and bonds with the skin to continue killing germs even after washing.  Please DO NOT use if you have an allergy to CHG or antibacterial soaps.  If your skin becomes  reddened/irritated stop using the CHG and inform your nurse when you arrive at Short Stay.  Do not shave (including legs and underarms) for at least 48 hours prior to the first CHG shower.  You may shave your face.  Please follow these instructions carefully:   1.  Shower with CHG Soap the night before surgery and the                                morning of Surgery.  2.  If you choose to wash your hair, wash your hair first as usual with your       normal shampoo.  3.  After you shampoo, rinse your hair and body thoroughly to remove the                      Shampoo.  4.  Use CHG as you would any other liquid soap.  You can apply chg directly       to the skin and wash gently with scrungie or a clean washcloth.  5.  Apply the CHG Soap to your body ONLY FROM THE NECK DOWN.        Do not use on open wounds or open sores.  Avoid contact with your eyes,       ears,  mouth and genitals (private parts).  Wash genitals (private parts)       with your normal soap.  6.  Wash thoroughly, paying special attention to the area where your surgery        will be performed.  7.  Thoroughly rinse your body with warm water from the neck down.  8.  DO NOT shower/wash with your normal soap after using and rinsing off       the CHG Soap.  9.  Pat yourself dry with a clean towel.            10.  Wear clean pajamas.            11.  Place clean sheets on your bed the night of your first shower and do not        sleep with pets.  Day of Surgery  Do not apply any lotions/deoderants the morning of surgery.  Please wear clean clothes to the hospital/surgery center.    Please read over the following fact sheets that you were given. Pain Booklet, Coughing and Deep Breathing, MRSA Information and Surgical Site Infection Prevention

## 2015-05-26 NOTE — Progress Notes (Signed)
Cardiologist denies  Medical Md is in [place]  Echo done 2009  Stress test denies  Heart cath denies  EKG denies in past yr  CXR denies in past yr

## 2015-05-27 LAB — HEMOGLOBIN A1C
HEMOGLOBIN A1C: 6.5 % — AB (ref 4.8–5.6)
Mean Plasma Glucose: 140 mg/dL

## 2015-05-27 NOTE — Progress Notes (Addendum)
Anesthesia Chart Review: Patient is a 65 year old female scheduled for right total shoulder arthroplasty on 06/03/15 by Dr. Tamera Punt.  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 BMI is consistent with obesity.   PCP is Dr. Ellison Hughs at [facility], last follow-up of chronic medical conditions 03/31/15. 6 month follow-up recommended. She denied CP, SOB, palpitations at that time.  HEM-ONC is Dr. Delight Hoh with [facility].  Meds include ASA, Lipitor, Wellbutrin XL, Allegra, Neurontin, Hyzaar, metformin, Paxil.   05/26/15 EKG: NSR, LAD, right BBB. Right BBB, LAD are new when compared to 06/16/10 EKG tracing (scanned under Media tab). She denied prior stress test or cath.   10/11/07 MUGA scan: IMPRESSION:Normal study. The LEFT ventricular ejection fraction measures 58.5%.   05/26/15 CXR: IMPRESSION: There is no active cardiopulmonary disease.  Preoperative labs noted. A1c 6.5. UA showed many bacteria, moderate leukocytes, negative nitrites, 6-30 WBCs. Voice message left with Carla at Dr. Bettina Gavia office regarding UA results. Defer additional recommendations, if any, to surgeon.   Recent PCP evaluation. Denied CV symptoms. DM is controlled. Reviewed above including EKGs with anesthesiologist Dr. Tobias Alexander. If no acute changes then it is anticipated that she can proceed as planned.  George Hugh [facility] Short Stay Center/Anesthesiology Phone [phone] 05/28/2015 3:27 PM

## 2015-06-02 MED ORDER — CEFAZOLIN SODIUM-DEXTROSE 2-4 GM/100ML-% IV SOLN
2.0000 g | INTRAVENOUS | Status: AC
  Administered 2015-06-03: 2 g via INTRAVENOUS
  Filled 2015-06-02: qty 100

## 2015-06-03 ENCOUNTER — Inpatient Hospital Stay (HOSPITAL_COMMUNITY): Payer: Managed Care, Other (non HMO) | Admitting: Certified Registered"

## 2015-06-03 ENCOUNTER — Encounter (HOSPITAL_COMMUNITY): Payer: Self-pay | Admitting: *Deleted

## 2015-06-03 ENCOUNTER — Inpatient Hospital Stay (HOSPITAL_COMMUNITY): Payer: Managed Care, Other (non HMO)

## 2015-06-03 ENCOUNTER — Inpatient Hospital Stay (HOSPITAL_COMMUNITY)
Admission: RE | Admit: 2015-06-03 | Discharge: 2015-06-04 | DRG: 483 | Disposition: A | Discharge status: Home / Self Care | Payer: Managed Care, Other (non HMO) | Source: Ambulatory Visit | Attending: Orthopedic Surgery | Admitting: Orthopedic Surgery

## 2015-06-03 ENCOUNTER — Inpatient Hospital Stay (HOSPITAL_COMMUNITY): Payer: Managed Care, Other (non HMO) | Admitting: Vascular Surgery

## 2015-06-03 ENCOUNTER — Encounter (HOSPITAL_COMMUNITY)
Admission: RE | Disposition: A | Discharge status: Home / Self Care | Payer: Self-pay | Source: Ambulatory Visit | Attending: Orthopedic Surgery

## 2015-06-03 DIAGNOSIS — Z96619 Presence of unspecified artificial shoulder joint: Secondary | ICD-10-CM

## 2015-06-03 DIAGNOSIS — E119 Type 2 diabetes mellitus without complications: Secondary | ICD-10-CM | POA: Diagnosis present

## 2015-06-03 DIAGNOSIS — Z7984 Long term (current) use of oral hypoglycemic drugs: Secondary | ICD-10-CM | POA: Diagnosis not present

## 2015-06-03 DIAGNOSIS — G4700 Insomnia, unspecified: Secondary | ICD-10-CM | POA: Diagnosis present

## 2015-06-03 DIAGNOSIS — Z880 Allergy status to penicillin: Secondary | ICD-10-CM

## 2015-06-03 DIAGNOSIS — E785 Hyperlipidemia, unspecified: Secondary | ICD-10-CM | POA: Diagnosis present

## 2015-06-03 DIAGNOSIS — I10 Essential (primary) hypertension: Secondary | ICD-10-CM | POA: Diagnosis present

## 2015-06-03 DIAGNOSIS — G43909 Migraine, unspecified, not intractable, without status migrainosus: Secondary | ICD-10-CM | POA: Diagnosis present

## 2015-06-03 DIAGNOSIS — M19011 Primary osteoarthritis, right shoulder: Secondary | ICD-10-CM | POA: Diagnosis present

## 2015-06-03 DIAGNOSIS — Z888 Allergy status to other drugs, medicaments and biological substances status: Secondary | ICD-10-CM | POA: Diagnosis not present

## 2015-06-03 DIAGNOSIS — F329 Major depressive disorder, single episode, unspecified: Secondary | ICD-10-CM | POA: Diagnosis present

## 2015-06-03 DIAGNOSIS — J302 Other seasonal allergic rhinitis: Secondary | ICD-10-CM | POA: Diagnosis present

## 2015-06-03 DIAGNOSIS — Z853 Personal history of malignant neoplasm of breast: Secondary | ICD-10-CM | POA: Diagnosis not present

## 2015-06-03 DIAGNOSIS — Z452 Encounter for adjustment and management of vascular access device: Secondary | ICD-10-CM

## 2015-06-03 DIAGNOSIS — Z91040 Latex allergy status: Secondary | ICD-10-CM | POA: Diagnosis not present

## 2015-06-03 DIAGNOSIS — Z96611 Presence of right artificial shoulder joint: Secondary | ICD-10-CM

## 2015-06-03 DIAGNOSIS — Z7982 Long term (current) use of aspirin: Secondary | ICD-10-CM | POA: Diagnosis not present

## 2015-06-03 DIAGNOSIS — Z79899 Other long term (current) drug therapy: Secondary | ICD-10-CM

## 2015-06-03 HISTORY — PX: TOTAL SHOULDER ARTHROPLASTY: SHX126

## 2015-06-03 HISTORY — DX: Gastro-esophageal reflux disease without esophagitis: K21.9

## 2015-06-03 HISTORY — PX: TOTAL SHOULDER REPLACEMENT: SUR1217

## 2015-06-03 LAB — GLUCOSE, CAPILLARY
GLUCOSE-CAPILLARY: 109 mg/dL — AB (ref 65–99)
GLUCOSE-CAPILLARY: 138 mg/dL — AB (ref 65–99)
GLUCOSE-CAPILLARY: 165 mg/dL — AB (ref 65–99)
Glucose-Capillary: 132 mg/dL — ABNORMAL HIGH (ref 65–99)
Glucose-Capillary: 130 mg/dL — ABNORMAL HIGH (ref 65–99)

## 2015-06-03 SURGERY — ARTHROPLASTY, SHOULDER, TOTAL
Anesthesia: Regional | Site: Shoulder | Laterality: Right

## 2015-06-03 MED ORDER — BUPROPION HCL ER (XL) 150 MG PO TB24
150.0000 mg | ORAL_TABLET | Freq: Every day | ORAL | Status: DC
  Administered 2015-06-03 – 2015-06-04 (×2): 150 mg via ORAL
  Filled 2015-06-03 (×2): qty 1

## 2015-06-03 MED ORDER — LACTATED RINGERS IV SOLN
INTRAVENOUS | Status: DC | PRN
  Administered 2015-06-03 (×2): via INTRAVENOUS

## 2015-06-03 MED ORDER — DIPHENHYDRAMINE HCL 12.5 MG/5ML PO ELIX: 12.5000 mg | ORAL_SOLUTION | ORAL | Status: DC | PRN

## 2015-06-03 MED ORDER — ACETAMINOPHEN 325 MG PO TABS: 650.0000 mg | ORAL_TABLET | Freq: Four times a day (QID) | ORAL | Status: DC | PRN

## 2015-06-03 MED ORDER — LABETALOL HCL 5 MG/ML IV SOLN
INTRAVENOUS | Status: AC
  Filled 2015-06-03: qty 4

## 2015-06-03 MED ORDER — SODIUM CHLORIDE 0.9 % IV SOLN
INTRAVENOUS | Status: DC
  Administered 2015-06-03 – 2015-06-04 (×2): via INTRAVENOUS

## 2015-06-03 MED ORDER — DOCUSATE SODIUM 100 MG PO CAPS
100.0000 mg | ORAL_CAPSULE | Freq: Two times a day (BID) | ORAL | Status: DC
  Administered 2015-06-03 – 2015-06-04 (×3): 100 mg via ORAL
  Filled 2015-06-03 (×3): qty 1

## 2015-06-03 MED ORDER — PHENYLEPHRINE 40 MCG/ML (10ML) SYRINGE FOR IV PUSH (FOR BLOOD PRESSURE SUPPORT)
PREFILLED_SYRINGE | INTRAVENOUS | Status: AC
  Filled 2015-06-03: qty 10

## 2015-06-03 MED ORDER — NEOSTIGMINE METHYLSULFATE 10 MG/10ML IV SOLN
INTRAVENOUS | Status: DC | PRN
  Administered 2015-06-03: 3 mg via INTRAVENOUS

## 2015-06-03 MED ORDER — INSULIN ASPART 100 UNIT/ML ~~LOC~~ SOLN
0.0000 [IU] | Freq: Three times a day (TID) | SUBCUTANEOUS | Status: DC
  Administered 2015-06-03 (×2): 2 [IU] via SUBCUTANEOUS
  Administered 2015-06-04: 3 [IU] via SUBCUTANEOUS

## 2015-06-03 MED ORDER — PHENYLEPHRINE HCL 10 MG/ML IJ SOLN
10.0000 mg | INTRAVENOUS | Status: DC | PRN
  Administered 2015-06-03: 10 ug/min via INTRAVENOUS

## 2015-06-03 MED ORDER — MENTHOL 3 MG MT LOZG: 1.0000 | LOZENGE | OROMUCOSAL | Status: DC | PRN

## 2015-06-03 MED ORDER — NEOSTIGMINE METHYLSULFATE 5 MG/5ML IV SOSY
PREFILLED_SYRINGE | INTRAVENOUS | Status: AC
  Filled 2015-06-03: qty 5

## 2015-06-03 MED ORDER — METOCLOPRAMIDE HCL 5 MG/ML IJ SOLN: 5.0000 mg | Freq: Three times a day (TID) | INTRAMUSCULAR | Status: DC | PRN

## 2015-06-03 MED ORDER — ASPIRIN EC 325 MG PO TBEC
325.0000 mg | DELAYED_RELEASE_TABLET | Freq: Two times a day (BID) | ORAL | Status: DC
  Administered 2015-06-03 – 2015-06-04 (×2): 325 mg via ORAL
  Filled 2015-06-03 (×3): qty 1

## 2015-06-03 MED ORDER — MIDAZOLAM HCL 5 MG/5ML IJ SOLN
INTRAMUSCULAR | Status: DC | PRN
  Administered 2015-06-03: 2 mg via INTRAVENOUS

## 2015-06-03 MED ORDER — LIDOCAINE 2% (20 MG/ML) 5 ML SYRINGE
INTRAMUSCULAR | Status: AC
  Filled 2015-06-03: qty 5

## 2015-06-03 MED ORDER — EPHEDRINE SULFATE 50 MG/ML IJ SOLN
INTRAMUSCULAR | Status: DC | PRN
Start: 2015-06-03 — End: 2015-06-03
  Administered 2015-06-03 (×2): 10 mg via INTRAVENOUS
  Administered 2015-06-03: 5 mg via INTRAVENOUS

## 2015-06-03 MED ORDER — METFORMIN HCL 850 MG PO TABS
850.0000 mg | ORAL_TABLET | Freq: Two times a day (BID) | ORAL | Status: DC
  Administered 2015-06-03 – 2015-06-04 (×2): 850 mg via ORAL
  Filled 2015-06-03 (×3): qty 1

## 2015-06-03 MED ORDER — PROPOFOL 10 MG/ML IV BOLUS
INTRAVENOUS | Status: AC
  Filled 2015-06-03: qty 20

## 2015-06-03 MED ORDER — LOSARTAN POTASSIUM 50 MG PO TABS
100.0000 mg | ORAL_TABLET | Freq: Every day | ORAL | Status: DC
  Administered 2015-06-03 – 2015-06-04 (×2): 100 mg via ORAL
  Filled 2015-06-03 (×2): qty 2

## 2015-06-03 MED ORDER — CEFAZOLIN SODIUM-DEXTROSE 2-4 GM/100ML-% IV SOLN
2.0000 g | Freq: Four times a day (QID) | INTRAVENOUS | Status: AC
  Administered 2015-06-03 – 2015-06-04 (×3): 2 g via INTRAVENOUS
  Filled 2015-06-03 (×3): qty 100

## 2015-06-03 MED ORDER — GLYCOPYRROLATE 0.2 MG/ML IJ SOLN
INTRAMUSCULAR | Status: DC | PRN
  Administered 2015-06-03: 0.4 mg via INTRAVENOUS

## 2015-06-03 MED ORDER — 0.9 % SODIUM CHLORIDE (POUR BTL) OPTIME
TOPICAL | Status: DC | PRN
  Administered 2015-06-03: 1000 mL

## 2015-06-03 MED ORDER — PHENYLEPHRINE HCL 10 MG/ML IJ SOLN
INTRAMUSCULAR | Status: DC | PRN
  Administered 2015-06-03: 80 ug via INTRAVENOUS

## 2015-06-03 MED ORDER — LABETALOL HCL 5 MG/ML IV SOLN
INTRAVENOUS | Status: DC | PRN
  Administered 2015-06-03: 10 mg via INTRAVENOUS

## 2015-06-03 MED ORDER — GABAPENTIN 300 MG PO CAPS
300.0000 mg | ORAL_CAPSULE | Freq: Every day | ORAL | Status: DC
  Administered 2015-06-03: 300 mg via ORAL
  Filled 2015-06-03: qty 1

## 2015-06-03 MED ORDER — TRANEXAMIC ACID 1000 MG/10ML IV SOLN
1000.0000 mg | INTRAVENOUS | Status: AC
  Administered 2015-06-03: 1000 mg via INTRAVENOUS
  Filled 2015-06-03: qty 10

## 2015-06-03 MED ORDER — FLEET ENEMA 7-19 GM/118ML RE ENEM: 1.0000 | ENEMA | Freq: Once | RECTAL | Status: DC | PRN

## 2015-06-03 MED ORDER — HYDROMORPHONE HCL 1 MG/ML IJ SOLN
0.5000 mg | INTRAMUSCULAR | Status: DC | PRN
  Administered 2015-06-04: 0.5 mg via INTRAVENOUS
  Filled 2015-06-03: qty 1

## 2015-06-03 MED ORDER — ROCURONIUM BROMIDE 50 MG/5ML IV SOLN
INTRAVENOUS | Status: AC
Start: 2015-06-03 — End: 2015-06-03
  Filled 2015-06-03: qty 1

## 2015-06-03 MED ORDER — BISACODYL 10 MG RE SUPP: 10.0000 mg | Freq: Every day | RECTAL | Status: DC | PRN

## 2015-06-03 MED ORDER — EPHEDRINE 5 MG/ML INJ
INTRAVENOUS | Status: AC
  Filled 2015-06-03: qty 10

## 2015-06-03 MED ORDER — POLYETHYLENE GLYCOL 3350 17 G PO PACK
17.0000 g | PACK | Freq: Every day | ORAL | Status: DC | PRN
Start: 2015-06-03 — End: 2015-06-04

## 2015-06-03 MED ORDER — ONDANSETRON HCL 4 MG/2ML IJ SOLN: 4.0000 mg | Freq: Four times a day (QID) | INTRAMUSCULAR | Status: DC | PRN

## 2015-06-03 MED ORDER — PAROXETINE HCL 20 MG PO TABS
40.0000 mg | ORAL_TABLET | Freq: Every day | ORAL | Status: DC
  Administered 2015-06-03 – 2015-06-04 (×2): 40 mg via ORAL
  Filled 2015-06-03 (×2): qty 2

## 2015-06-03 MED ORDER — FENTANYL CITRATE (PF) 250 MCG/5ML IJ SOLN
INTRAMUSCULAR | Status: AC
  Filled 2015-06-03: qty 5

## 2015-06-03 MED ORDER — ONDANSETRON HCL 4 MG PO TABS: 4.0000 mg | ORAL_TABLET | Freq: Four times a day (QID) | ORAL | Status: DC | PRN

## 2015-06-03 MED ORDER — ROCURONIUM BROMIDE 100 MG/10ML IV SOLN
INTRAVENOUS | Status: DC | PRN
  Administered 2015-06-03: 50 mg via INTRAVENOUS

## 2015-06-03 MED ORDER — SODIUM CHLORIDE 0.9 % IR SOLN
Status: DC | PRN
  Administered 2015-06-03: 3000 mL

## 2015-06-03 MED ORDER — PROPOFOL 10 MG/ML IV BOLUS
INTRAVENOUS | Status: DC | PRN
  Administered 2015-06-03: 100 mg via INTRAVENOUS
  Administered 2015-06-03: 60 mg via INTRAVENOUS
  Administered 2015-06-03: 50 mg via INTRAVENOUS

## 2015-06-03 MED ORDER — FENTANYL CITRATE (PF) 100 MCG/2ML IJ SOLN
INTRAMUSCULAR | Status: DC | PRN
  Administered 2015-06-03 (×2): 100 ug via INTRAVENOUS
  Administered 2015-06-03: 50 ug via INTRAVENOUS

## 2015-06-03 MED ORDER — ONDANSETRON HCL 4 MG/2ML IJ SOLN
INTRAMUSCULAR | Status: DC | PRN
  Administered 2015-06-03: 4 mg via INTRAVENOUS

## 2015-06-03 MED ORDER — GLYCOPYRROLATE 0.2 MG/ML IV SOSY
PREFILLED_SYRINGE | INTRAVENOUS | Status: AC
  Filled 2015-06-03: qty 6

## 2015-06-03 MED ORDER — HYDROMORPHONE HCL 2 MG PO TABS
2.0000 mg | ORAL_TABLET | ORAL | Status: DC | PRN
  Administered 2015-06-03 – 2015-06-04 (×3): 2 mg via ORAL
  Filled 2015-06-03 (×3): qty 1

## 2015-06-03 MED ORDER — LOSARTAN POTASSIUM-HCTZ 100-25 MG PO TABS: 1.0000 | ORAL_TABLET | Freq: Every day | ORAL | Status: DC

## 2015-06-03 MED ORDER — MIDAZOLAM HCL 2 MG/2ML IJ SOLN
INTRAMUSCULAR | Status: AC
  Filled 2015-06-03: qty 2

## 2015-06-03 MED ORDER — ATORVASTATIN CALCIUM 10 MG PO TABS
10.0000 mg | ORAL_TABLET | Freq: Every day | ORAL | Status: DC
Start: 2015-06-03 — End: 2015-06-04
  Administered 2015-06-03 – 2015-06-04 (×2): 10 mg via ORAL
  Filled 2015-06-03 (×2): qty 1

## 2015-06-03 MED ORDER — ALUMINUM HYDROXIDE GEL 320 MG/5ML PO SUSP
15.0000 mL | ORAL | Status: DC | PRN
  Filled 2015-06-03: qty 30

## 2015-06-03 MED ORDER — DIPHENHYDRAMINE HCL 25 MG PO CAPS: 25.0000 mg | ORAL_CAPSULE | ORAL | Status: DC | PRN

## 2015-06-03 MED ORDER — ONDANSETRON HCL 4 MG/2ML IJ SOLN
INTRAMUSCULAR | Status: AC
  Filled 2015-06-03: qty 2

## 2015-06-03 MED ORDER — ACETAMINOPHEN 500 MG PO TABS
1000.0000 mg | ORAL_TABLET | Freq: Four times a day (QID) | ORAL | Status: AC
  Administered 2015-06-03 – 2015-06-04 (×4): 1000 mg via ORAL
  Filled 2015-06-03 (×4): qty 2

## 2015-06-03 MED ORDER — HYDROCHLOROTHIAZIDE 25 MG PO TABS
25.0000 mg | ORAL_TABLET | Freq: Every day | ORAL | Status: DC
  Administered 2015-06-03 – 2015-06-04 (×2): 25 mg via ORAL
  Filled 2015-06-03 (×2): qty 1

## 2015-06-03 MED ORDER — PHENOL 1.4 % MT LIQD: 1.0000 | OROMUCOSAL | Status: DC | PRN

## 2015-06-03 MED ORDER — ACETAMINOPHEN 650 MG RE SUPP: 650.0000 mg | Freq: Four times a day (QID) | RECTAL | Status: DC | PRN

## 2015-06-03 MED ORDER — METOCLOPRAMIDE HCL 5 MG PO TABS: 5.0000 mg | ORAL_TABLET | Freq: Three times a day (TID) | ORAL | Status: DC | PRN

## 2015-06-03 MED ORDER — BUPIVACAINE-EPINEPHRINE (PF) 0.5% -1:200000 IJ SOLN
INTRAMUSCULAR | Status: DC | PRN
  Administered 2015-06-03: 25 mL

## 2015-06-03 SURGICAL SUPPLY — 68 items
AEQUALIS PERFORM GUIDEWIRE 205X220MM ×3 IMPLANT
BIT DRILL 5/64X5 DISP (BIT) ×3 IMPLANT
BLADE SAW SAG 73X25 THK (BLADE) ×2
BLADE SAW SGTL 73X25 THK (BLADE) ×1 IMPLANT
BLADE SURG 15 STRL LF DISP TIS (BLADE) ×1 IMPLANT
BLADE SURG 15 STRL SS (BLADE) ×2
CAP SHOULDER TOTAL 2 ×3 IMPLANT
CEMENT BONE DEPUY (Cement) ×3 IMPLANT
CHLORAPREP W/TINT 26ML (MISCELLANEOUS) ×3 IMPLANT
CLOSURE WOUND 1/2 X4 (GAUZE/BANDAGES/DRESSINGS) ×1
COVER SURGICAL LIGHT HANDLE (MISCELLANEOUS) ×3 IMPLANT
DRAPE INCISE IOBAN 66X45 STRL (DRAPES) ×3 IMPLANT
DRAPE ORTHO SPLIT 77X108 STRL (DRAPES) ×4
DRAPE SURG 17X23 STRL (DRAPES) ×3 IMPLANT
DRAPE SURG ORHT 6 SPLT 77X108 (DRAPES) ×2 IMPLANT
DRAPE U-SHAPE 47X51 STRL (DRAPES) ×3 IMPLANT
DRSG AQUACEL AG ADV 3.5X10 (GAUZE/BANDAGES/DRESSINGS) ×3 IMPLANT
ELECT BLADE 4.0 EZ CLEAN MEGAD (MISCELLANEOUS)
ELECT REM PT RETURN 9FT ADLT (ELECTROSURGICAL) ×3
ELECTRODE BLDE 4.0 EZ CLN MEGD (MISCELLANEOUS) IMPLANT
ELECTRODE REM PT RTRN 9FT ADLT (ELECTROSURGICAL) ×1 IMPLANT
EVACUATOR 1/8 PVC DRAIN (DRAIN) IMPLANT
GLOVE BIO SURGEON STRL SZ7 (GLOVE) IMPLANT
GLOVE BIO SURGEON STRL SZ7.5 (GLOVE) IMPLANT
GLOVE BIOGEL PI IND STRL 7.0 (GLOVE) ×1 IMPLANT
GLOVE BIOGEL PI IND STRL 8 (GLOVE) ×1 IMPLANT
GLOVE BIOGEL PI INDICATOR 7.0 (GLOVE) ×2
GLOVE BIOGEL PI INDICATOR 8 (GLOVE) ×2
GLOVE SURG SS PI 7.0 STRL IVOR (GLOVE) ×3 IMPLANT
GLOVE SURG SS PI 7.5 STRL IVOR (GLOVE) ×3 IMPLANT
GOWN STRL REUS W/ TWL LRG LVL3 (GOWN DISPOSABLE) ×3 IMPLANT
GOWN STRL REUS W/ TWL XL LVL3 (GOWN DISPOSABLE) ×1 IMPLANT
GOWN STRL REUS W/TWL LRG LVL3 (GOWN DISPOSABLE) ×6
GOWN STRL REUS W/TWL XL LVL3 (GOWN DISPOSABLE) ×2
HANDPIECE INTERPULSE COAX TIP (DISPOSABLE) ×2
HEMOSTAT SURGICEL 2X14 (HEMOSTASIS) ×3 IMPLANT
HOOD PEEL AWAY FLYTE STAYCOOL (MISCELLANEOUS) ×6 IMPLANT
KIT BASIN OR (CUSTOM PROCEDURE TRAY) ×3 IMPLANT
KIT ROOM TURNOVER OR (KITS) ×3 IMPLANT
MANIFOLD NEPTUNE II (INSTRUMENTS) ×3 IMPLANT
NEEDLE HYPO 25GX1X1/2 BEV (NEEDLE) IMPLANT
NEEDLE MAYO TROCAR (NEEDLE) ×3 IMPLANT
NS IRRIG 1000ML POUR BTL (IV SOLUTION) ×3 IMPLANT
PACK SHOULDER (CUSTOM PROCEDURE TRAY) ×3 IMPLANT
PAD ARMBOARD 7.5X6 YLW CONV (MISCELLANEOUS) ×6 IMPLANT
RESTRAINT HEAD UNIVERSAL NS (MISCELLANEOUS) ×3 IMPLANT
RETRIEVER SUT HEWSON (MISCELLANEOUS) ×3 IMPLANT
SET HNDPC FAN SPRY TIP SCT (DISPOSABLE) ×1 IMPLANT
SLING ARM IMMOBILIZER LRG (SOFTGOODS) ×3 IMPLANT
SLING ARM IMMOBILIZER MED (SOFTGOODS) IMPLANT
SMARTMIX MINI TOWER (MISCELLANEOUS) ×3
SPONGE LAP 18X18 X RAY DECT (DISPOSABLE) ×3 IMPLANT
SPONGE LAP 4X18 X RAY DECT (DISPOSABLE) IMPLANT
STRIP CLOSURE SKIN 1/2X4 (GAUZE/BANDAGES/DRESSINGS) ×2 IMPLANT
SUCTION FRAZIER HANDLE 10FR (MISCELLANEOUS) ×2
SUCTION TUBE FRAZIER 10FR DISP (MISCELLANEOUS) ×1 IMPLANT
SUPPORT WRAP ARM LG (MISCELLANEOUS) ×3 IMPLANT
SUT ETHIBOND NAB CT1 #1 30IN (SUTURE) ×12 IMPLANT
SUT MNCRL AB 4-0 PS2 18 (SUTURE) ×3 IMPLANT
SUT SILK 2 0 TIES 17X18 (SUTURE)
SUT SILK 2-0 18XBRD TIE BLK (SUTURE) IMPLANT
SUT VIC AB 2-0 CT1 27 (SUTURE) ×4
SUT VIC AB 2-0 CT1 TAPERPNT 27 (SUTURE) ×2 IMPLANT
SYR CONTROL 10ML LL (SYRINGE) IMPLANT
TAPE LABRALWHITE 1.5X36 (TAPE) ×6 IMPLANT
TOWEL OR 17X24 6PK STRL BLUE (TOWEL DISPOSABLE) ×3 IMPLANT
TOWEL OR 17X26 10 PK STRL BLUE (TOWEL DISPOSABLE) ×3 IMPLANT
TOWER SMARTMIX MINI (MISCELLANEOUS) ×1 IMPLANT

## 2015-06-03 NOTE — Discharge Instructions (Signed)

## 2015-06-03 NOTE — Anesthesia Preprocedure Evaluation (Signed)
Anesthesia Evaluation  Patient identified by MRN, date of birth, ID band Patient awake    Reviewed: Allergy & Precautions, NPO status , Patient's Chart, lab work & pertinent test results  History of Anesthesia Complications (+) PONV and history of anesthetic complications  Airway Mallampati: II  TM Distance: >3 FB Neck ROM: Full    Dental  (+) Teeth Intact, Partial Lower   Pulmonary neg pulmonary ROS,    breath sounds clear to auscultation       Cardiovascular hypertension, Pt. on medications (-) angina(-) Past MI and (-) CHF  Rhythm:Regular     Neuro/Psych  Headaches, neg Seizures Depression    GI/Hepatic negative GI ROS, Neg liver ROS,   Endo/Other  diabetes, Type 2, Oral Hypoglycemic AgentsMorbid obesity  Renal/GU negative Renal ROS     Musculoskeletal  (+) Arthritis ,   Abdominal   Peds  Hematology negative hematology ROS (+)   Anesthesia Other Findings   Reproductive/Obstetrics                             Anesthesia Physical Anesthesia Plan  ASA: III  Anesthesia Plan: General and Regional   Post-op Pain Management:    Induction: Intravenous  Airway Management Planned: Oral ETT  Additional Equipment: CVP and Ultrasound Guidance Line Placement  Intra-op Plan:   Post-operative Plan: Extubation in OR  Informed Consent: I have reviewed the patients History and Physical, chart, labs and discussed the procedure including the risks, benefits and alternatives for the proposed anesthesia with the patient or authorized representative who has indicated his/her understanding and acceptance.   Dental advisory given  Plan Discussed with: CRNA and Surgeon  Anesthesia Plan Comments:         Anesthesia Quick Evaluation

## 2015-06-03 NOTE — Op Note (Signed)
Procedure(s): TOTAL SHOULDER ARTHROPLASTY Procedure Note  Amanda [name] female 65 y.o. 06/03/2015  Procedure(s) and Anesthesia Type:    * RIGHT TOTAL SHOULDER ARTHROPLASTY - General  Surgeon(s) and Role:    * Tania Ade, MD - Primary   Indications:  65 y.o. female  With endstage right shoulder arthritis. Pain and dysfunction interfered with quality of life and nonoperative treatment with activity modification, NSAIDS and injections failed.     Surgeon: Nita Sells   Assistants: Jeanmarie Hubert PA-C [facility] was present and scrubbed throughout the procedure and was essential in positioning, retraction, exposure, and closure)  Anesthesia: General endotracheal anesthesia With preoperative interscalene block given by the attending anesthesiologist    Procedure Detail  TOTAL SHOULDER ARTHROPLASTY  Findings: Tornier flex anatomic press-fit size 5 stem with a 43 High offset head, cemented size 40 small Cortiloc glenoid.   A lesser tuberosity osteotomy was performed and repaired at the conclusion of the procedure.  Estimated Blood Loss:  200 mL         Drains: None   Blood Given: none          Specimens: none        Complications:  * No complications entered in OR log *         Disposition: PACU - hemodynamically stable.         Condition: stable    Procedure:   The patient was identified in the preoperative holding area where I personally marked the operative extremity after verifying with the patient and consent. She  was taken to the operating room where She was transferred to the   operative table.  The patient received an interscalene block in   the holding area by the attending anesthesiologist.  General anesthesia was induced   in the operating room without complication.  The patient did receive IV  Ancef prior to the commencement of the procedure.  The patient was   placed in the beach-chair position with the back raised about 30   degrees.   The nonoperative extremity and head and neck were carefully   positioned and padded protecting against neurovascular compromise.  The   left upper extremity was then prepped and draped in the standard sterile   fashion.    The appropriate operative time-out was performed with   Anesthesia, the perioperative staff, as well as myself and we all agreed   that the right side was the correct operative site.  An approximately   10 cm incision was made from the tip of the coracoid to the center point of the   humerus at the level of the axilla.  Dissection was carried down sharply   through subcutaneous tissues and cephalic vein was identified and taken   laterally with the deltoid.  The pectoralis major was taken medially.  The   upper 1 cm of the pectoralis major was released from its attachment on   the humerus.  The clavipectoral fascia was incised just lateral to the   conjoined tendon.  This incision was carried up to but not into the   coracoacromial ligament.  Digital palpation was used to prove   integrity of the axillary nerve which was protected throughout the   procedure.  Musculocutaneous nerve was not palpated in the operative   field.  Conjoined tendon was then retracted gently medially and the   deltoid laterally.  Anterior circumflex humeral vessels were clamped and   coagulated.  The soft tissues overlying the biceps  was incised and this   incision was carried across the transverse humeral ligament to the base   of the coracoid.  The biceps was Tenotomized and the remaining portion of the biceps superiorly was   excised.  An osteotomy was performed at the lesser tuberosity.  Capsule was then   released all the way down to the 6 o'clock position of the humeral head.   The humeral head was then delivered with simultaneous adduction,   extension and external rotation.  All humeral osteophytes were removed   and the anatomic neck of the humerus was marked and cut free hand at    approximately 25 degrees retroversion within about 3 mm of the cuff   reflection posteriorly.  The head size was estimated to be a 43 medium   offset.  At that point, the humeral head was retracted posteriorly with   a Fukuda retractor.  Remaining portion of the capsule was released at the base of the   coracoid.  The remaining biceps anchor and the entire anterior-inferior   labrum was excised.  The posterior labrum was also excised but the   posterior capsule was not released.  The guidepin was placed bicortically with +0 elevated guide.  The reamer was used to ream to concentric bone with punctate bleeding.  This gave an excellent concentric surface.  The center hole was then drilled for an anchor peg glenoid followed by the three peripheral holes and None of the holes   exited the glenoid wall.  I then pulse irrigated these holes and dried   them with Surgicel.  The three peripheral holes were then   pressurized cemented and the anchor peg glenoid was placed and impacted   with an excellent fit.  The glenoid was a Small 40 component.  The proximal humerus was then again exposed taking care not to displace the glenoid.    The entry awl was used followed by sounding reamers and then sequentially broached from size 3 to 5. This was then left in place and the calcar planer was used. Trial head was placed with a 43 high offset.  With the trial implantation of the component,  there was approximately 50% posterior translation with immediate snap back to the   anatomic position.  With forward elevation, there was no tendency   towards posterior subluxation.   The trial was removed and the final implant was prepared on a back table.  The trial was removed and the final implant was prepared on a back table.   3 small holes were drilled on the medial side of the lesser tuberosity osteotomy, through which 2 labral tapes were passed. The implant was then placed through the loop of the 2 labral tapes and  impacted with an excellent press-fit. This achieved excellent anatomic reconstruction of the proximal humerus.  The joint was then copiously irrigated with pulse lavage.  The subscapularis and   lesser tuberosity osteotomy were then repaired using the 2 labral tapes previously passed in a double row fashion with horizontal mattress sutures medially brought over through bone tunnels tied over a bone bridge laterally.   One #1 Ethibond was placed at the rotator interval just above   the lesser tuberosity. Copious irrigation was used. Skin was closed with 2-0 Vicryl sutures in the deep dermal layer and 4-0 Monocryl in a subcuticular  running fashion.  Sterile dressings were then applied including Aquacel.  The patient was placed in a sling and allowed to awaken from  general anesthesia and taken to the recovery room in stable condition.      POSTOPERATIVE PLAN:  Early passive range of motion will be allowed with the goal of 0 degrees external rotation and 90 degrees forward elevation.  No internal rotation at this time.  No active motion of the arm until the lesser tuberosity heals.  The patient will likely be kept in the hospital for 1-2 days and then discharged home.

## 2015-06-03 NOTE — H&P (Signed)
Amanda [name] is an 65 y.o. female.   Chief Complaint: R shoulder pain and dysfunction HPI: R shoulder endstage osteoarthritis with pain that limits ADLs and sleep.  Failed conservative treatment.  Past Medical History  Diagnosis Date  . Migraine   . Breast cancer ([place])   . Osteoarthritis of knee   . Hyperlipidemia     takes Atorvastatin daily  . Diabetes ([place])     takes Metformin daily  . Depression     takes Paxil and Wellbutrin daily  . Hypertension     takes Losartan-HCTZ daily  . Insomnia     takes Ambien nightly  . PONV (postoperative nausea and vomiting)   . Seasonal allergies     takes Allegra daily  . History of bronchitis 2 yrs ago  . Insomnia     takes gabapentin nightly  . Joint pain   . Back pain     occasionally  . Mood swings ([place])     Past Surgical History  Procedure Laterality Date  . Tubal ligation    . Knee arthroscopy Left     x 5  . Joint replacement Left 2014    knee  . Breast biopsy  2009  . Port a cath placed    . Mastectomy Left   . Cataract surgery Bilateral   . Colonoscopy      History reviewed. No pertinent family history. Social History:  reports that she has never smoked. She does not have any smokeless tobacco history on file. She reports that she drinks alcohol. She reports that she does not use illicit drugs.  Allergies:  Allergies  Allergen Reactions  . Ace Inhibitors Cough  . Latex Itching  . Morphine And Related Itching    Caused her to itch terribly. Would prefer if given to take with a benadryl  . Other Itching    Freeze spray. Patient stated that she may be able to use it now because she doesn't use it as often.  Marland Kitchen Penicillins Rash    Has patient had a PCN reaction causing immediate rash, facial/tongue/throat swelling, SOB or lightheadedness with hypotension: No Has patient had a PCN reaction causing severe rash involving mucus membranes or skin necrosis: No Has patient had a PCN reaction that required hospitalization  No Has patient had a PCN reaction occurring within the last 10 years: No If all of the above answers are "NO", then may proceed with Cephalosporin use.    Medications Prior to Admission  Medication Sig Dispense Refill  . aspirin EC 81 MG tablet Take 81 mg by mouth daily.     Marland Kitchen atorvastatin (LIPITOR) 10 MG tablet Take 10 mg by mouth daily.     Marland Kitchen buPROPion (WELLBUTRIN XL) 150 MG 24 hr tablet Take 150 mg by mouth daily.     . Calcium Carbonate-Vit D-Min (CALCIUM 600+D PLUS MINERALS) 600-400 MG-UNIT TABS Take by mouth.    . fexofenadine (ALLEGRA) 180 MG tablet Take 180 mg by mouth daily.     Marland Kitchen gabapentin (NEURONTIN) 300 MG capsule Take 300 mg by mouth at bedtime.  1  . losartan-hydrochlorothiazide (HYZAAR) 100-25 MG per tablet Take 1 tablet by mouth daily.     . metFORMIN (GLUCOPHAGE) 850 MG tablet Take 850 mg by mouth 2 (two) times daily with a meal.     . PARoxetine (PAXIL) 40 MG tablet Take 40 mg by mouth daily.     Marland Kitchen alendronate (FOSAMAX) 70 MG tablet Take by mouth.    Marland Kitchen  ibuprofen (ADVIL,MOTRIN) 200 MG tablet Take by mouth.    . Vitamin D, Ergocalciferol, (DRISDOL) 50000 UNITS CAPS capsule TAKE 1 CAPSULE (50,000 UNITS TOTAL) BY MOUTH EVERY 7 (SEVEN) DAYS. (Patient not taking: Reported on 05/25/2015) 8 capsule 0  . Vitamin D, Ergocalciferol, (DRISDOL) 50000 UNITS CAPS capsule Take 1 capsule (50,000 Units total) by mouth every 7 (seven) days. (Patient not taking: Reported on 05/25/2015) 12 capsule 1  . zolpidem (AMBIEN) 5 MG tablet Take by mouth.      Results for orders placed or performed during the hospital encounter of 06/03/15 (from the past 48 hour(s))  Glucose, capillary     Status: Abnormal   Collection Time: 06/03/15  5:46 AM  Result Value Ref Range   Glucose-Capillary 109 (H) 65 - 99 mg/dL   No results found.  Review of Systems  All other systems reviewed and are negative.   Blood pressure 131/71, pulse 77, temperature 98.1 F (36.7 C), temperature source Oral, resp. rate 18,  weight 97.977 kg (216 lb), SpO2 99 %. Physical Exam  Constitutional: She is oriented to person, place, and time. She appears well-developed and well-nourished.  HENT:  Head: Atraumatic.  Eyes: EOM are normal.  Cardiovascular: Intact distal pulses.   Respiratory: Effort normal.  Musculoskeletal:  R shoulder pain with limited ROM.  Neurological: She is alert and oriented to person, place, and time.  Skin: Skin is warm and dry.  Psychiatric: She has a normal mood and affect.     Assessment/Plan R shoulder endstage osteoarthritis Plan R total shoulder replacement Risks / benefits of surgery discussed Consent on chart  NPO for OR Preop antibiotics   Nita Sells, MD 06/03/2015, 7:18 AM

## 2015-06-03 NOTE — Transfer of Care (Signed)
Immediate Anesthesia Transfer of Care Note  Patient: Amanda [name]  Procedure(s) Performed: Procedure(s) with comments: TOTAL SHOULDER ARTHROPLASTY (Right) - Right total shoulder arthroplasty  Patient Location: PACU  Anesthesia Type:GA combined with regional for post-op pain  Level of Consciousness: awake, alert , oriented and patient cooperative  Airway & Oxygen Therapy: Patient Spontanous Breathing and Patient connected to face mask oxygen  Post-op Assessment: Report given to RN and Post -op Vital signs reviewed and stable  Post vital signs: Reviewed and stable  Last Vitals:  Filed Vitals:   06/03/15 0545  BP: 131/71  Pulse: 77  Temp: 36.7 C  Resp: 18    Last Pain:  Filed Vitals:   06/03/15 0546  PainSc: 7       Patients Stated Pain Goal: 4 ([id number])  Complications: No apparent anesthesia complications

## 2015-06-03 NOTE — Anesthesia Procedure Notes (Addendum)
Central Venous Catheter Insertion Performed by: anesthesiologist Patient location: Pre-op. Preanesthetic checklist: patient identified, IV checked, site marked, risks and benefits discussed, surgical consent, monitors and equipment checked, pre-op evaluation, timeout performed and anesthesia consent Position: Trendelenburg Lidocaine 1% used for infiltration Landmarks identified and Seldinger technique used Catheter size: 8 Fr Central line was placed.Double lumen Procedure performed using ultrasound guided technique. Attempts: 1 Following insertion, dressing applied, line sutured and Biopatch. Post procedure assessment: blood return through all ports, free fluid flow and no air. Patient tolerated the procedure well with no immediate complications.    Anesthesia Regional Block:  Interscalene brachial plexus block  Pre-Anesthetic Checklist: ,, timeout performed, Correct Patient, Correct Site, Correct Laterality, Correct Procedure, Correct Position, site marked, Risks and benefits discussed,  Surgical consent,  Pre-op evaluation,  At surgeon's request and post-op pain management  Laterality: Upper and Left  Prep: chloraprep       Needles:  Injection technique: Single-shot  Needle Type: Echogenic Stimulator Needle          Additional Needles:  Procedures: ultrasound guided (picture in chart) Interscalene brachial plexus block Narrative:  Injection made incrementally with aspirations every 5 mL.  Performed by: Personally  Anesthesiologist: Oleta Mouse  Additional Notes: H+P and labs reviewed, risks and benefits discussed with patient, procedure tolerated well without complications   Procedure Name: Intubation Date/Time: 06/03/2015 7:40 AM Performed by: Adalberto Ill Pre-anesthesia Checklist: Patient identified, Emergency Drugs available, Suction available, Patient being monitored and Timeout performed Patient Re-evaluated:Patient Re-evaluated prior to  inductionOxygen Delivery Method: Circle system utilized Preoxygenation: Pre-oxygenation with 100% oxygen Intubation Type: IV induction Ventilation: Mask ventilation without difficulty Laryngoscope Size: Miller and 2 Grade View: Grade I Tube type: Oral Tube size: 7.0 mm Number of attempts: 1 Placement Confirmation: ETT inserted through vocal cords under direct vision,  positive ETCO2 and breath sounds checked- equal and bilateral Secured at: 22 cm Tube secured with: Tape Dental Injury: Teeth and Oropharynx as per pre-operative assessment

## 2015-06-04 ENCOUNTER — Encounter (HOSPITAL_COMMUNITY): Payer: Self-pay | Admitting: Orthopedic Surgery

## 2015-06-04 LAB — GLUCOSE, CAPILLARY
GLUCOSE-CAPILLARY: 110 mg/dL — AB (ref 65–99)
Glucose-Capillary: 163 mg/dL — ABNORMAL HIGH (ref 65–99)

## 2015-06-04 LAB — CBC
HEMATOCRIT: 35.8 % — AB (ref 36.0–46.0)
HEMOGLOBIN: 11.1 g/dL — AB (ref 12.0–15.0)
MCH: 29.4 pg (ref 26.0–34.0)
MCHC: 31 g/dL (ref 30.0–36.0)
MCV: 95 fL (ref 78.0–100.0)
Platelets: 237 10*3/uL (ref 150–400)
RBC: 3.77 MIL/uL — AB (ref 3.87–5.11)
RDW: 12.6 % (ref 11.5–15.5)
WBC: 6 10*3/uL (ref 4.0–10.5)

## 2015-06-04 MED ORDER — HYDROMORPHONE HCL 2 MG PO TABS: 1.0000 mg | ORAL_TABLET | ORAL | Status: DC | PRN

## 2015-06-04 MED ORDER — DOCUSATE SODIUM 100 MG PO CAPS: 100.0000 mg | ORAL_CAPSULE | Freq: Three times a day (TID) | ORAL | Status: DC | PRN

## 2015-06-04 NOTE — Progress Notes (Signed)
D/c to home with husband via wheelchair.  VSS pain 4/10 tolerating well. Dressing cdi sling in place. RX and instructions given and demonstrates understanding

## 2015-06-04 NOTE — Progress Notes (Signed)
   PATIENT ID: Malachy Moan   1 Day Post-Op Procedure(s) (LRB): TOTAL SHOULDER ARTHROPLASTY (Right)  Subjective: Doing well this am. Had some trouble sleeping overnight. Pain is well controlled with oral dilaudid. Happy to go home today. Reports some tingling in fingers residual from block.   Objective:  Filed Vitals:   06/04/15 0233 06/04/15 0630  BP: 126/61 109/66  Pulse: 86 83  Temp: 99.1 F (37.3 C) 98.9 F (37.2 C)  Resp: 15 16     R UE dressing c/d/i Wiggles fingers, activates deltoid Distally NVI  Labs:   Recent Labs  06/04/15 0658  HGB 11.1*   Recent Labs  06/04/15 0658  WBC 6.0  RBC 3.77*  HCT 35.8*  PLT 237  No results for input(s): NA, K, CL, CO2, BUN, CREATININE, GLUCOSE, CALCIUM in the last 72 hours.  Assessment and Plan: 1 day s/p right TSA OT today, PROM FF and ER limited to 90 and neutral D/c home Dilaudid for home pain control, script in chart Fu with Dr. Tamera Punt in 2 weeks  VTE proph: ASA 325mg  BID, SCDs

## 2015-06-04 NOTE — Progress Notes (Signed)
Occupational Therapy Treatment/Discharge Patient Details Name: Amanda [name] MRN: [id number] DOB: 1950-04-01 Today's Date: 06/04/2015    History of present illness 65 y.o. female s/p R total shoulder arthroplasty. PMH significant for HTN, HLD, DM, migraine, breast cancer, insomnia, L TKA (2014), and back pain.   OT comments  Pt performed UB dressing and bathing tasks with husband assisting this session and therapist providing verbal cues for proper technique. Reviewed precautions, activity restrictions and gradual progression, HEP, pain management, and fall prevention strategies. All education has been completed and pt/husband have no further questions. Pt with no further acute OT needs. OT signing off.   Follow Up Recommendations  Other (comment) (Progress shoulder rehab at MD discretion)    Equipment Recommendations  None recommended by OT    Recommendations for Other Services      Precautions / Restrictions Precautions Precautions: Shoulder Type of Shoulder Precautions: Shoulder P/AAROM allowed - FF 90, ER 0; AROM elbow/wrist/hand allowed Shoulder Interventions: Off for dressing/bathing/exercises Precaution Booklet Issued: Yes (comment) Precaution Comments: Reviewed shoulder precautions and provided handout Required Braces or Orthoses: Sling Restrictions Weight Bearing Restrictions: Yes RUE Weight Bearing: Non weight bearing       Mobility Bed Mobility Overal bed mobility: Needs Assistance Bed Mobility: Supine to Sit     Supine to sit: Min assist     General bed mobility comments: Min assist provided by pt's husband for trunk support to come to sitting position. HOB elevated, no use of bedrails, exited on L side to simulate home environment.  Transfers Overall transfer level: Needs assistance Equipment used: None Transfers: Sit to/from Stand Sit to Stand: Supervision         General transfer comment: Supervision for safety. No physical assist required, no  LOB noted or reports of dizzness upon standing.    Balance Overall balance assessment: Needs assistance Sitting-balance support: No upper extremity supported;Feet supported Sitting balance-Leahy Scale: Good     Standing balance support: No upper extremity supported;During functional activity Standing balance-Leahy Scale: Good                     ADL Overall ADL's : Needs assistance/impaired         Upper Body Bathing: Minimal assitance;Sitting;Cueing for compensatory techniques;Adhering to UE precautions;With caregiver independent assisting Upper Body Bathing Details (indicate cue type and reason): Cues to use thin wash cloth and for proper technique Lower Body Bathing: Min guard;Cueing for compensatory techniques;Sit to/from stand Lower Body Bathing Details (indicate cue type and reason): Cues to keep RUE in sling position while bathing  Upper Body Dressing : Minimal assistance;With caregiver independent assisting;Cueing for UE precautions;Cueing for compensatory techniques;Sitting Upper Body Dressing Details (indicate cue type and reason): Cues to dress RLE first and undress it last, advised pt that front button shirts or large stretch t-shirts would be easiest Lower Body Dressing: Minimal assistance;With caregiver independent assisting;Cueing for compensatory techniques;Sit to/from stand Lower Body Dressing Details (indicate cue type and reason): Cues to dress LB first before UB, cues to keep sling donned while dressing LB Toilet Transfer: Supervision/safety;Ambulation;Regular Toilet   Toileting- Water quality scientist and Hygiene: Supervision/safety;Sit to/from stand   Tub/ Shower Transfer: Walk-in shower;Min guard;Ambulation;Cueing for safety Tub/Shower Transfer Details (indicate cue type and reason): Cues to sit and bathe Functional mobility during ADLs: Min guard General ADL Comments: Reviewed and practiced UB ADLs with pt's husband. Reviewed HEP and precautions, pain  management with ice and elevation, and fall prevention strategies.      Vision  Perception     Praxis      Cognition   Behavior During Therapy: WFL for tasks assessed/performed Overall Cognitive Status: Within Functional Limits for tasks assessed                       Extremity/Trunk Assessment  Upper Extremity Assessment Upper Extremity Assessment: RUE deficits/detail RUE Deficits / Details: decreased ROM and strength as expected post op RUE: Unable to fully assess due to pain            Exercises Shoulder Exercises Shoulder Flexion: PROM;AAROM;Right;10 reps;Supine;Seated (0-90) Shoulder External Rotation: PROM;AAROM;Right;10 reps;Supine;Seated (sling position-0 (neutral)) Elbow Flexion: AROM;Right;10 reps;Supine;Seated Elbow Extension: AROM;Right;10 reps;Supine;Seated Wrist Flexion: AROM;Right;10 reps;Supine;Seated Wrist Extension: AROM;Right;10 reps;Supine;Seated Digit Composite Flexion: AROM;Right;10 reps;Supine;Seated Composite Extension: AROM;Right;10 reps;Seated;Supine Donning/doffing shirt without moving shoulder: Minimal assistance;Caregiver independent with task Method for sponge bathing under operated UE: Minimal assistance;Caregiver independent with task Donning/doffing sling/immobilizer: Minimal assistance;Caregiver independent with task Correct positioning of sling/immobilizer: Supervision/safety ROM for elbow, wrist and digits of operated UE: Supervision/safety Sling wearing schedule (on at all times/off for ADL's): Supervision/safety Proper positioning of operated UE when showering: Supervision/safety Positioning of UE while sleeping: Supervision/safety   Shoulder Instructions Shoulder Instructions Donning/doffing shirt without moving shoulder: Minimal assistance;Caregiver independent with task Method for sponge bathing under operated UE: Minimal assistance;Caregiver independent with task Donning/doffing  sling/immobilizer: Minimal assistance;Caregiver independent with task Correct positioning of sling/immobilizer: Supervision/safety ROM for elbow, wrist and digits of operated UE: Supervision/safety Sling wearing schedule (on at all times/off for ADL's): Supervision/safety Proper positioning of operated UE when showering: Supervision/safety Positioning of UE while sleeping: Supervision/safety     General Comments      Pertinent Vitals/ Pain       Pain Assessment: Faces Pain Score: 4  Faces Pain Scale: Hurts little more Pain Location: R shoulder Pain Descriptors / Indicators: Aching;Sore Pain Intervention(s): Limited activity within patient's tolerance;Monitored during session;Premedicated before session;Repositioned  Home Living Family/patient expects to be discharged to:: Private residence Living Arrangements: Spouse/significant other Available Help at Discharge: Family;Available 24 hours/day Type of Home: House Home Access: Level entry     Home Layout: One level (3 stairs to get to bedrooms)     Bathroom Shower/Tub: Hospital doctor Toilet: Handicapped height     Home Equipment: Environmental consultant - 2 wheels;Cane - single point;Bedside commode;Shower seat;Hand held shower head          Prior Functioning/Environment Level of Independence: Independent        Comments: Loves music   Frequency Min 2X/week     Progress Toward Goals  OT Goals(current goals can now be found in the care plan section)  Progress towards OT goals: Goals met/education completed, patient discharged from OT  Acute Rehab OT Goals Patient Stated Goal: to be independent again OT Goal Formulation: With patient Time For Goal Achievement: 06/18/15 Potential to Achieve Goals: Good ADL Goals Pt Will Perform Upper Body Dressing: with min assist;sitting;with caregiver independent in assisting Pt Will Perform Lower Body Dressing: with min assist;with caregiver independent in assisting;sit to/from  stand Pt Will Transfer to Toilet: with supervision;ambulating;bedside commode Pt Will Perform Toileting - Clothing Manipulation and hygiene: with supervision;sit to/from stand Pt Will Perform Tub/Shower Transfer: Shower transfer;with supervision;ambulating;shower seat Pt/caregiver will Perform Home Exercise Program: Increased ROM;Right Upper extremity;With written HEP provided;With Supervision  Plan All goals met and education completed, patient discharged from OT services    Co-evaluation  End of Session Equipment Utilized During Treatment: Other (comment) (sling)   Activity Tolerance Patient tolerated treatment well   Patient Left in chair;with call bell/phone within reach;with family/visitor present   Nurse Communication Mobility status        Time: 8115-7262 OT Time Calculation (min): 26 min  Charges: OT General Charges $OT Visit: 1 Procedure OT Evaluation $OT Eval Moderate Complexity: 1 Procedure OT Treatments $Self Care/Home Management : 23-37 mins $Therapeutic Exercise: 8-22 mins  Redmond Baseman, OTR/L Pager: [phone] 06/04/2015, 12:34 PM

## 2015-06-04 NOTE — Progress Notes (Signed)
Occupational Therapy Evaluation Patient Details Name: Amanda [name] MRN: [id number] DOB: 07/05/1950 Today's Date: 06/04/2015    History of Present Illness 65 y.o. female s/p R total shoulder arthroplasty. PMH significant for HTN, HLD, DM, migraine, breast cancer, insomnia, L TKA (2014), and back pain.   Clinical Impression   PTA, pt was independent with ADLs and mobility. Pt currently requires mod assist for UB ADLs and min guard assist with verbal cues to complete RUE HEP. Educated pt on shoulder precautions, sling wear protocol, positioning for sleep/rest, and compensatory strategies for UB ADLs. Pt plans to d/c home with 24/7 assistance from her husband. Will follow up today with second session when husband is present to practice dressing and bathing tasks at pt request.     Follow Up Recommendations  Other (comment) (Progress shoulder rehab at MD discretion)    Equipment Recommendations  None recommended by OT    Recommendations for Other Services       Precautions / Restrictions Precautions Precautions: Shoulder Type of Shoulder Precautions: Shoulder P/AAROM allowed - FF 90, ER 0; AROM elbow/wrist/hand allowed Shoulder Interventions: Off for dressing/bathing/exercises Precaution Booklet Issued: Yes (comment) Precaution Comments: Reviewed shoulder precautions and provided handout Required Braces or Orthoses: Sling Restrictions Weight Bearing Restrictions: Yes RUE Weight Bearing: Non weight bearing      Mobility Bed Mobility               General bed mobility comments: Focus of session on HEP and pt education  Transfers                 General transfer comment: Pt declined mobilizing OOB at this time due to finally finding some pain relief. Focus of session on HEP and pt education    Balance                                            ADL Overall ADL's : Needs assistance/impaired         Upper Body Bathing: Moderate  assistance;Sitting;Cueing for compensatory techniques   Lower Body Bathing: Minimal assistance;Sit to/from stand   Upper Body Dressing : Moderate assistance;Cueing for compensatory techniques;Sitting   Lower Body Dressing: Minimal assistance;Sit to/from stand                 General ADL Comments: Educated pt on shoulder precautions, sling wear protocol, positioning for sleep/rest, and RUE HEP. No family present for OT eval.     Vision Vision Assessment?: No apparent visual deficits   Perception     Praxis      Pertinent Vitals/Pain Pain Assessment: 0-10 Pain Score: 4  Pain Location: R shoulder Pain Descriptors / Indicators: Aching;Sore Pain Intervention(s): Limited activity within patient's tolerance;Monitored during session;Repositioned;Ice applied;Patient requesting pain meds-RN notified     Hand Dominance Right   Extremity/Trunk Assessment Upper Extremity Assessment Upper Extremity Assessment: RUE deficits/detail RUE Deficits / Details: decreased ROM and strength as expected post op RUE: Unable to fully assess due to pain           Communication Communication Communication: No difficulties   Cognition Arousal/Alertness: Awake/alert Behavior During Therapy: WFL for tasks assessed/performed Overall Cognitive Status: Within Functional Limits for tasks assessed                     General Comments       Exercises Exercises: Shoulder  Shoulder Instructions Shoulder Instructions Donning/doffing shirt without moving shoulder: Moderate assistance Method for sponge bathing under operated UE: Moderate assistance Donning/doffing sling/immobilizer: Moderate assistance Correct positioning of sling/immobilizer: Minimal assistance ROM for elbow, wrist and digits of operated UE: Min-guard Sling wearing schedule (on at all times/off for ADL's): Min-guard Proper positioning of operated UE when showering: Minimal assistance Positioning of UE while  sleeping: Minimal assistance    Home Living Family/patient expects to be discharged to:: Private residence Living Arrangements: Spouse/significant other Available Help at Discharge: Family;Available 24 hours/day Type of Home: House Home Access: Level entry     Home Layout: One level (3 stairs to get to bedrooms)     Bathroom Shower/Tub: Hospital doctor Toilet: Handicapped height     Home Equipment: Environmental consultant - 2 wheels;Cane - single point;Bedside commode;Shower seat;Hand held shower head          Prior Functioning/Environment Level of Independence: Independent        Comments: Loves music    OT Diagnosis: Acute pain   OT Problem List: Decreased strength;Decreased range of motion;Decreased activity tolerance;Impaired balance (sitting and/or standing);Decreased safety awareness;Decreased knowledge of use of DME or AE;Decreased knowledge of precautions;Pain;Impaired UE functional use   OT Treatment/Interventions: Self-care/ADL training;Therapeutic exercise;DME and/or AE instruction;Energy conservation;Therapeutic activities;Patient/family education;Balance training    OT Goals(Current goals can be found in the care plan section) Acute Rehab OT Goals Patient Stated Goal: to be independent again OT Goal Formulation: With patient Time For Goal Achievement: 06/18/15 Potential to Achieve Goals: Good ADL Goals Pt Will Perform Upper Body Dressing: with min assist;sitting;with caregiver independent in assisting Pt Will Perform Lower Body Dressing: with min assist;with caregiver independent in assisting;sit to/from stand Pt Will Transfer to Toilet: with supervision;ambulating;bedside commode (over toilet) Pt Will Perform Toileting - Clothing Manipulation and hygiene: with supervision;sit to/from stand Pt Will Perform Tub/Shower Transfer: Shower transfer;with supervision;ambulating;shower seat Pt/caregiver will Perform Home Exercise Program: Increased ROM;Right Upper  extremity;With written HEP provided;With Supervision  OT Frequency: Min 2X/week   Barriers to D/C:            Co-evaluation              End of Session Equipment Utilized During Treatment: Other (comment) (sling) Nurse Communication: Mobility status;Patient requests pain meds  Activity Tolerance: Patient tolerated treatment well Patient left: in bed;with call bell/phone within reach   Time: 0923-1004 OT Time Calculation (min): 41 min Charges:  OT General Charges $OT Visit: 1 Procedure OT Evaluation $OT Eval Moderate Complexity: 1 Procedure OT Treatments $Self Care/Home Management : 8-22 mins $Therapeutic Exercise: 8-22 mins G-Codes:    Redmond Baseman, OTR/L Pager: [phone] 06/04/2015, 11:04 AM

## 2015-06-05 NOTE — Anesthesia Postprocedure Evaluation (Signed)
Anesthesia Post Note  Patient: Amanda [name]  Procedure(s) Performed: Procedure(s) (LRB): TOTAL SHOULDER ARTHROPLASTY (Right)  Patient location during evaluation: PACU Anesthesia Type: General and Regional Level of consciousness: awake Pain management: pain level controlled Vital Signs Assessment: post-procedure vital signs reviewed and stable Respiratory status: spontaneous breathing Cardiovascular status: stable Postop Assessment: no signs of nausea or vomiting    Last Vitals:  Filed Vitals:   06/04/15 0233 06/04/15 0630  BP: 126/61 109/66  Pulse: 86 83  Temp: 37.3 C 37.2 C  Resp: 15 16    Last Pain:  Filed Vitals:   06/04/15 0728  PainSc: 2                  Jacorie Ernsberger

## 2015-06-07 NOTE — Discharge Summary (Signed)
Patient ID: NATAJHA PINTADO MRN: [id number] DOB/AGE: 02/11/1950 65 y.o.  Admit date: 06/03/2015 Discharge date: 5/819/2017  Admission Diagnoses:  Active Problems:   Status post total shoulder arthroplasty   Discharge Diagnoses:  Same  Past Medical History  Diagnosis Date  . Migraine   . Breast cancer ([place])   . Osteoarthritis of knee   . Hyperlipidemia     takes Atorvastatin daily  . Diabetes ([place])     takes Metformin daily  . Depression     takes Paxil and Wellbutrin daily  . Hypertension     takes Losartan-HCTZ daily  . Insomnia     takes Ambien nightly  . PONV (postoperative nausea and vomiting)   . Seasonal allergies     takes Allegra daily  . History of bronchitis 2 yrs ago  . Insomnia     takes gabapentin nightly  . Joint pain   . Back pain     occasionally  . Mood swings ([place])   . GERD (gastroesophageal reflux disease)     Surgeries: Procedure(s): TOTAL SHOULDER ARTHROPLASTY on 06/03/2015   Consultants:    Discharged Condition: Improved  Hospital Course: CHAZLYN OLMEDO is an 65 y.o. female who was admitted 06/03/2015 for operative treatment of right shoulder end stage OA. Patient has severe unremitting pain that affects sleep, daily activities, and work/hobbies. After pre-op clearance the patient was taken to the operating room on 06/03/2015 and underwent  Procedure(s): TOTAL SHOULDER ARTHROPLASTY.    Patient was given perioperative antibiotics:  Anti-infectives    Start     Dose/Rate Route Frequency Ordered Stop   06/03/15 1330  ceFAZolin (ANCEF) IVPB 2g/100 mL premix     2 g 200 mL/hr over 30 Minutes Intravenous Every 6 hours 06/03/15 1122 06/04/15 0646   06/03/15 0600  ceFAZolin (ANCEF) IVPB 2g/100 mL premix     2 g 200 mL/hr over 30 Minutes Intravenous On call to O.R. 06/02/15 1333 06/03/15 0806       Patient was given sequential compression devices, early ambulation, and chemoprophylaxis to prevent DVT.  Patient benefited maximally from  hospital stay and there were no complications.    Recent vital signs: No data found.    Recent laboratory studies: No results for input(s): WBC, HGB, HCT, PLT, NA, K, CL, CO2, BUN, CREATININE, GLUCOSE, INR, CALCIUM in the last 72 hours.  Invalid input(s): PT, 2   Discharge Medications:     Medication List    STOP taking these medications        ibuprofen 200 MG tablet  Commonly known as:  ADVIL,MOTRIN      TAKE these medications        alendronate 70 MG tablet  Commonly known as:  FOSAMAX  Take by mouth.     aspirin EC 81 MG tablet  Take 81 mg by mouth daily.     atorvastatin 10 MG tablet  Commonly known as:  LIPITOR  Take 10 mg by mouth daily.     buPROPion 150 MG 24 hr tablet  Commonly known as:  WELLBUTRIN XL  Take 150 mg by mouth daily.     CALCIUM 600+D PLUS MINERALS 600-400 MG-UNIT Tabs  Take by mouth.     docusate sodium 100 MG capsule  Commonly known as:  COLACE  Take 1 capsule (100 mg total) by mouth 3 (three) times daily as needed.     fexofenadine 180 MG tablet  Commonly known as:  ALLEGRA  Take 180 mg by mouth daily.  gabapentin 300 MG capsule  Commonly known as:  NEURONTIN  Take 300 mg by mouth at bedtime.     HYDROmorphone 2 MG tablet  Commonly known as:  DILAUDID  Take 0.5-1 tablets (1-2 mg total) by mouth every 4 (four) hours as needed for severe pain.     losartan-hydrochlorothiazide 100-25 MG tablet  Commonly known as:  HYZAAR  Take 1 tablet by mouth daily.     metFORMIN 850 MG tablet  Commonly known as:  GLUCOPHAGE  Take 850 mg by mouth 2 (two) times daily with a meal.     PARoxetine 40 MG tablet  Commonly known as:  PAXIL  Take 40 mg by mouth daily.     Vitamin D (Ergocalciferol) 50000 units Caps capsule  Commonly known as:  DRISDOL  TAKE 1 CAPSULE (50,000 UNITS TOTAL) BY MOUTH EVERY 7 (SEVEN) DAYS.     Vitamin D (Ergocalciferol) 50000 units Caps capsule  Commonly known as:  DRISDOL  Take 1 capsule (50,000 Units total)  by mouth every 7 (seven) days.     zolpidem 5 MG tablet  Commonly known as:  AMBIEN  Take by mouth.        Diagnostic Studies: Dg Chest 2 View  05/26/2015  CLINICAL DATA:  Preoperative exam prior to right total shoulder arthroplasty. History of breast malignancy, hyperlipidemia, diabetes, and hypertension. EXAM: CHEST  2 VIEW COMPARISON:  PA and lateral chest x-ray dated June 20, 2010. FINDINGS: The lungs are adequately inflated and clear. The heart and pulmonary vascularity are normal. The mediastinum is normal in width. There is no pleural effusion. The patient has undergone previous left mastectomy. The bony thorax exhibits no acute abnormality. IMPRESSION: There is no active cardiopulmonary disease. Electronically Signed   By: David  Martinique M.D.   On: 05/26/2015 11:29   Dg Chest Port 1 View  06/03/2015  CLINICAL DATA:  65 year old female with a history of central line placement, recent right shoulder surgery EXAM: PORTABLE CHEST 1 VIEW COMPARISON:  05/26/2015 FINDINGS: Cardiomediastinal silhouette likely unchanged, with low lung volumes accentuating the central vasculature. Ill-defined opacities at the bilateral lung bases. No pneumothorax. Left IJ approach central venous catheter with the tip appearing to terminate at the superior vena cava. Partially visualized changes of right shoulder arthroplasty. IMPRESSION: Interval left IJ approach central venous catheter with no evidence of pneumothorax. Tip appears to terminate in the superior vena cava. Low lung volumes with likely atelectasis. Signed, Dulcy Fanny. Earleen Newport, DO [facility] Electronically Signed   By: Corrie Mckusick D.O.   On: 06/03/2015 10:40   Dg Shoulder Right Port  06/03/2015  CLINICAL DATA:  Status post right shoulder arthroplasty EXAM: PORTABLE RIGHT SHOULDER - 2+ VIEW COMPARISON:  None. FINDINGS: Single frontal view demonstrates interval right shoulder hemiarthroplasty with  well-positioned right proximal humeral prosthesis. No evidence of dislocation at the right glenohumeral joint. Right acromioclavicular joint appears intact. No osseous fracture or suspicious focal osseous lesion. Expected gas surrounding the right glenohumeral joint. Moderate right basilar atelectasis. IMPRESSION: Satisfactory single frontal view of the right shoulder status post hemiarthroplasty. Electronically Signed   By: Ilona Sorrel M.D.   On: 06/03/2015 10:35    Disposition: 01-Home or Self Care      Discharge Instructions    Call MD / Call 911    Complete by:  As directed   If you experience chest pain or shortness of breath, CALL 911 and be transported to the hospital emergency room.  If  you develope a fever above 101 F, pus (white drainage) or increased drainage or redness at the wound, or calf pain, call your surgeon's office.     Constipation Prevention    Complete by:  As directed   Drink plenty of fluids.  Prune juice may be helpful.  You may use a stool softener, such as Colace (over the counter) 100 mg twice a day.  Use MiraLax (over the counter) for constipation as needed.     Diet - low sodium heart healthy    Complete by:  As directed      Increase activity slowly as tolerated    Complete by:  As directed            Follow-up Information    Follow up with Nita Sells, MD. Schedule an appointment as soon as possible for a visit in 2 weeks.   Specialty:  Orthopedic Surgery   Contact information:   [place] [address] [phone]        Signed: Grier Mitts 06/07/2015, 1:20 PM

## 2015-07-27 ENCOUNTER — Other Ambulatory Visit: Payer: Self-pay | Admitting: *Deleted

## 2015-07-27 DIAGNOSIS — C50919 Malignant neoplasm of unspecified site of unspecified female breast: Secondary | ICD-10-CM

## 2015-07-28 ENCOUNTER — Inpatient Hospital Stay: Payer: Managed Care, Other (non HMO)

## 2015-07-28 ENCOUNTER — Inpatient Hospital Stay: Payer: Managed Care, Other (non HMO) | Attending: Oncology | Admitting: Oncology

## 2015-07-28 VITALS — BP 119/82 | HR 86 | Temp 96.4°F | Resp 18 | Wt 209.3 lb

## 2015-07-28 DIAGNOSIS — F39 Unspecified mood [affective] disorder: Secondary | ICD-10-CM | POA: Diagnosis not present

## 2015-07-28 DIAGNOSIS — Z8669 Personal history of other diseases of the nervous system and sense organs: Secondary | ICD-10-CM | POA: Diagnosis not present

## 2015-07-28 DIAGNOSIS — N644 Mastodynia: Secondary | ICD-10-CM | POA: Insufficient documentation

## 2015-07-28 DIAGNOSIS — Z7982 Long term (current) use of aspirin: Secondary | ICD-10-CM | POA: Diagnosis not present

## 2015-07-28 DIAGNOSIS — Z79899 Other long term (current) drug therapy: Secondary | ICD-10-CM | POA: Insufficient documentation

## 2015-07-28 DIAGNOSIS — K219 Gastro-esophageal reflux disease without esophagitis: Secondary | ICD-10-CM | POA: Diagnosis not present

## 2015-07-28 DIAGNOSIS — Z803 Family history of malignant neoplasm of breast: Secondary | ICD-10-CM | POA: Diagnosis not present

## 2015-07-28 DIAGNOSIS — F329 Major depressive disorder, single episode, unspecified: Secondary | ICD-10-CM | POA: Diagnosis not present

## 2015-07-28 DIAGNOSIS — M549 Dorsalgia, unspecified: Secondary | ICD-10-CM | POA: Diagnosis not present

## 2015-07-28 DIAGNOSIS — Z7984 Long term (current) use of oral hypoglycemic drugs: Secondary | ICD-10-CM | POA: Insufficient documentation

## 2015-07-28 DIAGNOSIS — M8580 Other specified disorders of bone density and structure, unspecified site: Secondary | ICD-10-CM | POA: Insufficient documentation

## 2015-07-28 DIAGNOSIS — E119 Type 2 diabetes mellitus without complications: Secondary | ICD-10-CM | POA: Diagnosis not present

## 2015-07-28 DIAGNOSIS — E785 Hyperlipidemia, unspecified: Secondary | ICD-10-CM | POA: Diagnosis not present

## 2015-07-28 DIAGNOSIS — C50919 Malignant neoplasm of unspecified site of unspecified female breast: Secondary | ICD-10-CM

## 2015-07-28 DIAGNOSIS — M2550 Pain in unspecified joint: Secondary | ICD-10-CM | POA: Insufficient documentation

## 2015-07-28 DIAGNOSIS — I10 Essential (primary) hypertension: Secondary | ICD-10-CM | POA: Diagnosis not present

## 2015-07-28 DIAGNOSIS — Z807 Family history of other malignant neoplasms of lymphoid, hematopoietic and related tissues: Secondary | ICD-10-CM | POA: Insufficient documentation

## 2015-07-28 DIAGNOSIS — G4700 Insomnia, unspecified: Secondary | ICD-10-CM | POA: Insufficient documentation

## 2015-07-28 DIAGNOSIS — Z853 Personal history of malignant neoplasm of breast: Secondary | ICD-10-CM | POA: Diagnosis not present

## 2015-07-28 DIAGNOSIS — M1990 Unspecified osteoarthritis, unspecified site: Secondary | ICD-10-CM | POA: Insufficient documentation

## 2015-07-28 DIAGNOSIS — C50912 Malignant neoplasm of unspecified site of left female breast: Secondary | ICD-10-CM

## 2015-07-28 LAB — CBC WITH DIFFERENTIAL/PLATELET
Basophils Absolute: 0.1 10*3/uL (ref 0–0.1)
Basophils Relative: 2 %
EOS ABS: 0.2 10*3/uL (ref 0–0.7)
Eosinophils Relative: 5 %
HCT: 38.4 % (ref 35.0–47.0)
HEMOGLOBIN: 12.9 g/dL (ref 12.0–16.0)
LYMPHS ABS: 1.3 10*3/uL (ref 1.0–3.6)
Lymphocytes Relative: 32 %
MCH: 29.5 pg (ref 26.0–34.0)
MCHC: 33.6 g/dL (ref 32.0–36.0)
MCV: 87.8 fL (ref 80.0–100.0)
MONO ABS: 0.6 10*3/uL (ref 0.2–0.9)
MONOS PCT: 15 %
NEUTROS PCT: 46 %
Neutro Abs: 1.8 10*3/uL (ref 1.4–6.5)
Platelets: 316 10*3/uL (ref 150–440)
RBC: 4.38 MIL/uL (ref 3.80–5.20)
RDW: 13.2 % (ref 11.5–14.5)
WBC: 4 10*3/uL (ref 3.6–11.0)

## 2015-07-28 LAB — COMPREHENSIVE METABOLIC PANEL
ALK PHOS: 73 U/L (ref 38–126)
ALT: 34 U/L (ref 14–54)
ANION GAP: 8 (ref 5–15)
AST: 46 U/L — ABNORMAL HIGH (ref 15–41)
Albumin: 4.3 g/dL (ref 3.5–5.0)
BILIRUBIN TOTAL: 0.7 mg/dL (ref 0.3–1.2)
BUN: 22 mg/dL — ABNORMAL HIGH (ref 6–20)
CALCIUM: 9.7 mg/dL (ref 8.9–10.3)
CO2: 29 mmol/L (ref 22–32)
Chloride: 100 mmol/L — ABNORMAL LOW (ref 101–111)
Creatinine, Ser: 0.93 mg/dL (ref 0.44–1.00)
GFR calc non Af Amer: >60 mL/min (ref >60)
Glucose, Bld: 142 mg/dL — ABNORMAL HIGH (ref 65–99)
Potassium: 4.3 mmol/L (ref 3.5–5.1)
SODIUM: 137 mmol/L (ref 135–145)
TOTAL PROTEIN: 7.7 g/dL (ref 6.5–8.1)

## 2015-07-28 NOTE — Progress Notes (Signed)
States would like referral to see plastic surgeon since is unhappy with results after mastectomy. States that is unhappy with how breast prosthesis fits and is unable to wear certain clothing.

## 2015-07-29 LAB — CANCER ANTIGEN 27.29: CA 27.29: 32 U/mL (ref 0.0–38.6)

## 2015-07-29 NOTE — Progress Notes (Signed)
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  INTERVAL HISTORY: Patient returns to clinic today for routine yearly evaluation.  She currently feels well and is asymptomatic. She is requesting a referral to a plastic surgeon to evaluate her mastectomy site.  She has no neurologic complaints. She denies any recent fevers or illnesses.  She denies any chest pain or shortness of breath.  She denies any nausea, vomiting, constipation, or diarrhea.  She has no urinary complaints.  Patient offers no specific complaints today.  REVIEW OF SYSTEMS:   Review of Systems  Constitutional: Negative.  Negative for fever, weight loss and malaise/fatigue.  Respiratory: Negative.  Negative for cough and shortness of breath.   Cardiovascular: Negative.  Negative for chest pain.  Gastrointestinal: Negative.  Negative for abdominal pain.  Genitourinary: Negative.   Musculoskeletal: Negative.   Neurological: Negative.  Negative for sensory change and weakness.  Endo/Heme/Allergies: Negative.   Psychiatric/Behavioral: Negative.     As per HPI. Otherwise, a complete review of systems is negatve.  PAST MEDICAL HISTORY: Past Medical History  Diagnosis Date  . Migraine   . Breast cancer ([place])   . Osteoarthritis of knee   . Hyperlipidemia     takes Atorvastatin daily  . Diabetes ([place])     takes Metformin daily  . Depression     takes Paxil and Wellbutrin daily  . Hypertension     takes Losartan-HCTZ daily  . Insomnia     takes Ambien nightly  . PONV (postoperative nausea and vomiting)   . Seasonal allergies     takes Allegra daily  . History of bronchitis 2 yrs ago  . Insomnia     takes gabapentin nightly  . Joint pain   .  Back pain     occasionally  . Mood swings ([place])   . GERD (gastroesophageal reflux disease)     PAST SURGICAL HISTORY: Past Surgical History  Procedure Laterality Date  . Tubal ligation    . Knee arthroscopy Left     x 5  . Joint replacement Left 2014    knee  . Breast biopsy  2009  . Port a cath placed    . Mastectomy Left   . Cataract surgery Bilateral   . Colonoscopy    . Total shoulder replacement Right 06/03/2015  . Total shoulder arthroplasty Right 06/03/2015    Procedure: TOTAL SHOULDER ARTHROPLASTY;  Surgeon: Tania Ade, MD;  Location: [facility];  Service: Orthopedics;  Laterality: Right;  Right total shoulder arthroplasty    FAMILY HISTORY: Father with non-Hodgkin's lymphoma, 2 paternal aunts with breast cancer.     ADVANCED DIRECTIVES:    HEALTH MAINTENANCE: Social History  Substance Use Topics  . Smoking status: Never Smoker   . Smokeless tobacco: Never Used  . Alcohol Use: 0.0 oz/week    0 Standard drinks or equivalent per week     Comment: occasionally wine     Colonoscopy:  PAP:  Bone density:  Lipid panel:  Allergies  Allergen Reactions  . Ace Inhibitors Cough  . Latex Itching  . Morphine And Related Itching    Caused her to itch terribly. Would prefer if given to take with a benadryl  . Other Itching    Freeze spray. Patient stated that  she may be able to use it now because she doesn't use it as often.  Marland Kitchen Penicillins Rash    Has patient had a PCN reaction causing immediate rash, facial/tongue/throat swelling, SOB or lightheadedness with hypotension: No Has patient had a PCN reaction causing severe rash involving mucus membranes or skin necrosis: No Has patient had a PCN reaction that required hospitalization No Has patient had a PCN reaction occurring within the last 10 years: No If all of the above answers are "NO", then may proceed with Cephalosporin use.    Current Outpatient Prescriptions  Medication Sig Dispense Refill  . aspirin EC  81 MG tablet Take 81 mg by mouth daily.     Marland Kitchen atorvastatin (LIPITOR) 10 MG tablet Take 10 mg by mouth daily.     Marland Kitchen buPROPion (WELLBUTRIN XL) 150 MG 24 hr tablet Take 150 mg by mouth daily.     . Calcium Carbonate-Vit D-Min (CALCIUM 600+D PLUS MINERALS) 600-400 MG-UNIT TABS Take by mouth.    . fexofenadine (ALLEGRA) 180 MG tablet Take 180 mg by mouth daily.     Marland Kitchen gabapentin (NEURONTIN) 300 MG capsule Take 300 mg by mouth at bedtime.  1  . HYDROmorphone (DILAUDID) 2 MG tablet Take 0.5-1 tablets (1-2 mg total) by mouth every 4 (four) hours as needed for severe pain. 60 tablet 0  . losartan-hydrochlorothiazide (HYZAAR) 100-25 MG per tablet Take 1 tablet by mouth daily.     . metFORMIN (GLUCOPHAGE) 850 MG tablet Take 850 mg by mouth 2 (two) times daily with a meal.     . PARoxetine (PAXIL) 40 MG tablet Take 40 mg by mouth daily.      No current facility-administered medications for this visit.    OBJECTIVE: Filed Vitals:   07/28/15 1042  BP: 119/82  Pulse: 86  Temp: 96.4 F (35.8 C)  Resp: 18     Body mass index is 34.83 kg/(m^2).    ECOG FS:0 - Asymptomatic  General: Well-developed, well-nourished, no acute distress. Eyes: Pink conjunctiva, anicteric sclera. Breast: Left chest wall without evidence of recurrence.  Right breast and axilla without lumps or masses Lungs: Clear to auscultation bilaterally. Heart: Regular rate and rhythm. No rubs, murmurs, or gallops. Abdomen: Soft, nontender, nondistended. No organomegaly noted, normoactive bowel sounds. Musculoskeletal: No edema, cyanosis, or clubbing. Neuro: Alert, answering all questions appropriately. Cranial nerves grossly intact. Skin: No rashes or petechiae noted. Psych: Normal affect.   LAB RESULTS:  Lab Results  Component Value Date   NA 137 07/28/2015   K 4.3 07/28/2015   CL 100* 07/28/2015   CO2 29 07/28/2015   GLUCOSE 142* 07/28/2015   BUN 22* 07/28/2015   CREATININE 0.93 07/28/2015   CALCIUM 9.7 07/28/2015   PROT  7.7 07/28/2015   ALBUMIN 4.3 07/28/2015   AST 46* 07/28/2015   ALT 34 07/28/2015   ALKPHOS 73 07/28/2015   BILITOT 0.7 07/28/2015   GFRNONAA >60 07/28/2015   GFRAA >60 07/28/2015    Lab Results  Component Value Date   WBC 4.0 07/28/2015   NEUTROABS 1.8 07/28/2015   HGB 12.9 07/28/2015   HCT 38.4 07/28/2015   MCV 87.8 07/28/2015   PLT 316 07/28/2015     STUDIES: No results found.  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  PLAN:    1.  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  Patient completed 5 years of Arimidex  in June of 2015.  Patient's most recent mammogram in November 2016  was reported as BI-RADS 1, repeat in November 2076.  Return to clinic in 1 year for routine evaluation.   2.  Osteopenia: Patient's bone mineral density from December 24, 2012 reported a T score of -1.2.  She has elected to disconitune Fosamax.  Continue calcium and vitamin D. 3. Left mastectomy discomfort: Patient was given a referral to plastic surgery.  Patient expressed understanding and was in agreement with this plan. She also understands that She can call clinic at any time with any questions, concerns, or complaints.   Breast cancer   Staging form: Breast, AJCC 7th Edition     Pathologic stage from 08/11/2014: Stage IIIA (T0, N2a, cM0) - Signed by Lloyd Huger, MD on 08/11/2014   Lloyd Huger, MD   07/29/2015 8:48 PM

## 2015-08-16 ENCOUNTER — Telehealth: Payer: Self-pay

## 2015-08-16 ENCOUNTER — Other Ambulatory Visit: Payer: Self-pay | Admitting: *Deleted

## 2015-08-16 DIAGNOSIS — Z853 Personal history of malignant neoplasm of breast: Secondary | ICD-10-CM

## 2015-08-16 NOTE — Telephone Encounter (Signed)
Patient had not yet heard anything regarding plastic surgery referral. Patient's chart reviewed and referral had been sent on 07/29/2015 to Dr. Iran Planas.  Patient given phone number for surgeon and will forward note to Argentina Donovan, RN so she can confirm referral.

## 2015-08-17 ENCOUNTER — Encounter: Payer: Self-pay | Admitting: General Surgery

## 2015-08-17 NOTE — Telephone Encounter (Signed)
error 

## 2015-08-23 DIAGNOSIS — Z853 Personal history of malignant neoplasm of breast: Secondary | ICD-10-CM | POA: Insufficient documentation

## 2015-08-23 DIAGNOSIS — Z9012 Acquired absence of left breast and nipple: Secondary | ICD-10-CM | POA: Insufficient documentation

## 2015-08-24 ENCOUNTER — Ambulatory Visit (INDEPENDENT_AMBULATORY_CARE_PROVIDER_SITE_OTHER): Payer: 59 | Admitting: Surgery

## 2015-08-24 ENCOUNTER — Encounter: Payer: Self-pay | Admitting: Surgery

## 2015-08-24 VITALS — BP 137/72 | HR 93 | Temp 97.4°F | Ht 65.0 in | Wt 209.0 lb

## 2015-08-24 DIAGNOSIS — C50912 Malignant neoplasm of unspecified site of left female breast: Secondary | ICD-10-CM

## 2015-08-24 NOTE — Patient Instructions (Addendum)
We have set up your PET scan for Thursday 08/26/15 at 1pm. You will need to arrive at 1230. Nothing to eat after midnight the night prior and hold your dose of  MRI of Brain scheduled 1100am, Tomorrow, 08/25/15. Check in at 1030am at the [facility]. No prep is needed for this testing.  The checklist has been sent to begin the process of scheduling your Ultrasound guided biopsy of your left neck lymph node. I will call you as soon as this has been scheduled to let you know the details of this appointment. You will need to have nothing by mouth after midnight prior to your scheduled biopsy.  Wait to have your lab work drawn until you speak with me. This lab work has to be done within 5 days of your scheduled biopsy. Just hold on to your lab slips that you have been given today and I will let you know when to have these drawn.  We will follow-up with you in the office on 09/15/15 unless we have all testing back earlier than this. If this is the case, I will call you back and move your appointment up.  Please call with any questions or concerns prior to your scheduled appointment.

## 2015-08-24 NOTE — Progress Notes (Signed)
Patient ID: Amanda [name], female   DOB: Sep 03, 1950, 65 y.o.   MRN: [id number]  HPI Amanda [name] is a 65 y.o. female for by Dr. Tamala Julian and in seen in consultation for a left neck mass. Therefore she notice and left neck nodule about 10 days ago after an IV was placed on her neck. No pain is in the left side. She does notice that on her left breast were the mastectomy incision she has experienced pain that is sharp, intermittent, non radiated and moderate in intensity for the last few months. He is recently seen by Dr. Grayland Ormond about a month ago and a recent mammogram that has been personally reviewed showing no evidence of CA in the right side. 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 This was done in 2010. He is also an awaiting an evaluation for the plastic surgeon for her chronic pain and chest wall deformity after the mastectomy. She denies any weight loss, any fevers and chills or any other symptoms  HPI  Past Medical History:  Diagnosis Date  . Back pain    occasionally  . Breast cancer ([place])   . Depression    takes Paxil and Wellbutrin daily  . Diabetes ([place])    takes Metformin daily  . GERD (gastroesophageal reflux disease)   . History of bronchitis 2 yrs ago  . Hyperlipidemia    takes Atorvastatin daily  . Hypertension    takes Losartan-HCTZ daily  . Insomnia    takes Ambien nightly  . Insomnia    takes gabapentin nightly  . Joint pain   . Migraine   . Mood swings ([place])   . Osteoarthritis of knee   . PONV (postoperative nausea and vomiting)   . Seasonal allergies    takes Allegra daily    Past Surgical History:  Procedure Laterality Date  . BREAST BIOPSY  2009  . cataract surgery Bilateral   . COLONOSCOPY    . JOINT REPLACEMENT Left 2014   knee  . KNEE ARTHROSCOPY Left    x 5  . MASTECTOMY Left   . port a cath placed    . TOTAL SHOULDER  ARTHROPLASTY Right 06/03/2015   Procedure: TOTAL SHOULDER ARTHROPLASTY;  Surgeon: Tania Ade, MD;  Location: [facility];  Service: Orthopedics;  Laterality: Right;  Right total shoulder arthroplasty  . TOTAL SHOULDER REPLACEMENT Right 06/03/2015  . TUBAL LIGATION      History reviewed. No pertinent family history.  Social History Social History  Substance Use Topics  . Smoking status: Never Smoker  . Smokeless tobacco: Never Used  . Alcohol use 0.0 oz/week     Comment: occasionally wine    Allergies  Allergen Reactions  . Ace Inhibitors Cough  . Latex Itching  . Morphine And Related Itching    Caused her to itch terribly. Would prefer if given to take with a benadryl  . Other Itching    Freeze spray. Patient stated that she may be able to use it now because she doesn't use it as often.  Marland Kitchen Penicillins Rash    Has patient had a PCN reaction causing immediate rash, facial/tongue/throat swelling, SOB or lightheadedness with hypotension: No Has patient had a PCN reaction causing severe rash involving mucus membranes or skin necrosis: No Has patient had a PCN reaction that required hospitalization No Has patient had a PCN reaction occurring within the last 10  years: No If all of the above answers are "NO", then may proceed with Cephalosporin use.    Current Outpatient Prescriptions  Medication Sig Dispense Refill  . aspirin EC 81 MG tablet Take 81 mg by mouth daily.     Marland Kitchen atorvastatin (LIPITOR) 10 MG tablet Take 10 mg by mouth daily.     Marland Kitchen buPROPion (WELLBUTRIN XL) 150 MG 24 hr tablet Take 150 mg by mouth daily.     . Calcium Carbonate-Vit D-Min (CALCIUM 600+D PLUS MINERALS) 600-400 MG-UNIT TABS Take by mouth.    . clindamycin (CLEOCIN) 300 MG capsule Take 1 capsule by mouth 3 (three) times daily.    . fexofenadine (ALLEGRA) 180 MG tablet Take 180 mg by mouth daily.     Marland Kitchen gabapentin (NEURONTIN) 300 MG capsule Take 300 mg by mouth at bedtime.  1  . HYDROmorphone (DILAUDID) 2 MG  tablet Take 0.5-1 tablets (1-2 mg total) by mouth every 4 (four) hours as needed for severe pain. 60 tablet 0  . losartan-hydrochlorothiazide (HYZAAR) 100-25 MG per tablet Take 1 tablet by mouth daily.     . metFORMIN (GLUCOPHAGE) 850 MG tablet Take 850 mg by mouth 2 (two) times daily with a meal.     . PARoxetine (PAXIL) 40 MG tablet Take 40 mg by mouth daily.      No current facility-administered medications for this visit.      Review of Systems A 10 point review of systems was asked and was negative except for the information on the HPI  Physical Exam Blood pressure 137/72, pulse 93, temperature 97.4 F (36.3 C), temperature source Oral, height 5\' 5"  (1.651 m), weight 94.8 kg (209 lb). CONSTITUTIONAL: NAD obese EYES: Pupils are equal, round, and reactive to light, Sclera are non-icteric. EARS, NOSE, MOUTH AND THROAT: The oropharynx is clear. The oral mucosa is pink and moist. Hearing is intact to voice. LYMPH NODES:  Left supraclavicular nodes, C a cluster of 3 nodes hard and there are attached to deep tissue. No axillary lymphadenopathy RESPIRATORY:  Lungs are clear. There is normal respiratory effort, with equal breath sounds bilaterally, and without pathologic use of accessory muscles. Breast: Left chest wall with a previous mastectomy deformity and with some significant redundant superior and inferior flaps. There is no obvious evidence of masses. On the right breast there is no nipple or skin lesions. And there are no breast masses CARDIOVASCULAR: Heart is regular without murmurs, gallops, or rubs. GI: The abdomen is  soft, nontender, and nondistended. There are no palpable masses. There is no hepatosplenomegaly. There are normal bowel sounds in all quadrants. GU: Rectal deferred.   MUSCULOSKELETAL: Normal muscle strength and tone. No cyanosis or edema.   SKIN: Turgor is good and there are no pathologic skin lesions or ulcers. NEUROLOGIC: Motor and sensation is grossly normal.  Cranial nerves are grossly intact. PSYCH:  Oriented to person, place and time. Affect is normal.  Data Reviewed  I have personally reviewed the patient's imaging, laboratory findings and medical records.    Assessment Plan 65 year old female with a history of inflammatory breast cancer in 2010 now comes with a new left neck mass concerning for metastatic disease. First order of business is to obtain tissue diagnosis of the left neck mass. We'll arrange for ultrasound-guided core needle biopsy. We will also arrange for a PET CT as well as an MRI. I do have highly suspicion that this is metastatic disease to the left neck as far as her left chest wall/breast  pain I do think this is a recurrence from from inflammatory breast cancer. Discussed with the patient in detail and extensive counseling provided. We'll see her back in a few weeks once we have a tissue diagnosis. She will likely benefit also from referral to oncology and tumor board meeting. No need for surgical intervention at this time. Caroleen Hamman, MD FACS General Surgeon 08/24/2015, 2:45 PM

## 2015-08-25 ENCOUNTER — Telehealth: Payer: Self-pay

## 2015-08-25 ENCOUNTER — Ambulatory Visit
Admission: RE | Admit: 2015-08-25 | Discharge: 2015-08-25 | Disposition: A | Discharge status: Home / Self Care | Payer: Managed Care, Other (non HMO) | Source: Ambulatory Visit | Attending: Surgery | Admitting: Surgery

## 2015-08-25 ENCOUNTER — Other Ambulatory Visit: Payer: Self-pay

## 2015-08-25 DIAGNOSIS — C50912 Malignant neoplasm of unspecified site of left female breast: Secondary | ICD-10-CM | POA: Diagnosis not present

## 2015-08-25 DIAGNOSIS — R591 Generalized enlarged lymph nodes: Secondary | ICD-10-CM

## 2015-08-25 MED ORDER — GADOBENATE DIMEGLUMINE 529 MG/ML IV SOLN
20.0000 mL | Freq: Once | INTRAVENOUS | Status: AC | PRN
  Administered 2015-08-25: 19 mL via INTRAVENOUS

## 2015-08-25 NOTE — Telephone Encounter (Signed)
Patient's PET rescheduled to 08/27/15 at 0900am due to insurance authorization still pending. Patient was notified of this change.

## 2015-08-26 ENCOUNTER — Other Ambulatory Visit: Payer: Self-pay

## 2015-08-26 DIAGNOSIS — R591 Generalized enlarged lymph nodes: Secondary | ICD-10-CM

## 2015-08-26 NOTE — Telephone Encounter (Signed)
Patient came to office and all information regarding appointments were given. All questions answered and patient was sent to radiology to obtain contrast.

## 2015-08-26 NOTE — Telephone Encounter (Signed)
Biopsy of Neck Lymph Node has been scheduled for Monday at 0830. Patient needs to arrive at at 0800 at [facility].  Call made to patient at this time. Called home number and left message for return phone call.   When patient returns phone call, I will go over details of CT scans and Biopsy scheduled.

## 2015-08-26 NOTE — Telephone Encounter (Signed)
MRI Brain and PET scan have been denied. Dr. Dahlia Byes will complete Peer to Peer as soon as he arrives into office this morning. Will update as soon as peer to peer is completed.

## 2015-08-26 NOTE — Telephone Encounter (Signed)
Dr. Dahlia Byes completed Peer to Peer. MRI- Brain was approved, however, PET was denied until patient has CT Neck, Chest, and Abdomen/Pelvis with contrast. Orders placed.   Spoke with April at [employer]. PET cancelled and scheduled all CT's above for 08/27/15 at 10am. Patient will need to pick up contrast and arrive at 0945am. NPO after MN tonight.

## 2015-08-26 NOTE — Telephone Encounter (Signed)
Obtained approval from Dr. Charlesetta Ivory per Dr. Dahlia Byes to do the Ultrasound Guided Biopsy of Left Neck Lymph Node.  Call made to [facility] at this time. No answer. Left voicemail stating this information and that we are ok to schedule. Awaiting return phone call with scheduling details.

## 2015-08-27 ENCOUNTER — Other Ambulatory Visit: Payer: Managed Care, Other (non HMO)

## 2015-08-27 ENCOUNTER — Ambulatory Visit
Admission: RE | Admit: 2015-08-27 | Discharge: 2015-08-27 | Disposition: A | Discharge status: Home / Self Care | Payer: Managed Care, Other (non HMO) | Source: Ambulatory Visit | Attending: Surgery | Admitting: Surgery

## 2015-08-27 ENCOUNTER — Other Ambulatory Visit: Payer: Self-pay | Admitting: Family Medicine

## 2015-08-27 ENCOUNTER — Other Ambulatory Visit: Payer: Self-pay | Admitting: Radiology

## 2015-08-27 ENCOUNTER — Telehealth: Payer: Self-pay

## 2015-08-27 DIAGNOSIS — M47892 Other spondylosis, cervical region: Secondary | ICD-10-CM | POA: Diagnosis not present

## 2015-08-27 DIAGNOSIS — R591 Generalized enlarged lymph nodes: Secondary | ICD-10-CM | POA: Insufficient documentation

## 2015-08-27 DIAGNOSIS — R221 Localized swelling, mass and lump, neck: Secondary | ICD-10-CM | POA: Insufficient documentation

## 2015-08-27 DIAGNOSIS — I700 Atherosclerosis of aorta: Secondary | ICD-10-CM | POA: Diagnosis not present

## 2015-08-27 DIAGNOSIS — I2510 Atherosclerotic heart disease of native coronary artery without angina pectoris: Secondary | ICD-10-CM | POA: Insufficient documentation

## 2015-08-27 DIAGNOSIS — M4802 Spinal stenosis, cervical region: Secondary | ICD-10-CM | POA: Diagnosis not present

## 2015-08-27 DIAGNOSIS — R918 Other nonspecific abnormal finding of lung field: Secondary | ICD-10-CM | POA: Insufficient documentation

## 2015-08-27 MED ORDER — IOPAMIDOL (ISOVUE-300) INJECTION 61%: 125.0000 mL | Freq: Once | INTRAVENOUS | Status: DC | PRN

## 2015-08-27 NOTE — Telephone Encounter (Signed)
Reviewed results of CT Neck, Abdomen, Pelvis, and Neck. Results are very concerning for Metastasis. Order for PET Scan placed.   Called Central Scheduling at this time. PET scheduled for Wednesday 09/01/15 at 1130- arrive at 1100am at the [facility] . NPO after MN and hold am dose of Metformin.

## 2015-08-27 NOTE — Telephone Encounter (Signed)
I have called [employer] to obtain authorization for Pet scan. This is in Medical Review at this time. A decision will be made by the end of the business day on 08/30/15. To follow up on this authorization for the status of approval contact [phone] reference to case # [id number]. CPT: [id number].

## 2015-08-27 NOTE — Telephone Encounter (Signed)
Spoke with Patient at this time. Explained that results are concerning and she is in need of PET scan. Given all scheduling information for PET.  Please make sure Authorization is completed.

## 2015-08-30 ENCOUNTER — Ambulatory Visit
Admission: RE | Admit: 2015-08-30 | Discharge: 2015-08-30 | Disposition: A | Discharge status: Home / Self Care | Payer: Managed Care, Other (non HMO) | Source: Ambulatory Visit | Attending: Surgery | Admitting: Surgery

## 2015-08-30 ENCOUNTER — Inpatient Hospital Stay: Payer: Managed Care, Other (non HMO) | Attending: Oncology | Admitting: *Deleted

## 2015-08-30 DIAGNOSIS — M47814 Spondylosis without myelopathy or radiculopathy, thoracic region: Secondary | ICD-10-CM | POA: Insufficient documentation

## 2015-08-30 DIAGNOSIS — R221 Localized swelling, mass and lump, neck: Secondary | ICD-10-CM | POA: Diagnosis not present

## 2015-08-30 DIAGNOSIS — C779 Secondary and unspecified malignant neoplasm of lymph node, unspecified: Secondary | ICD-10-CM | POA: Insufficient documentation

## 2015-08-30 DIAGNOSIS — C799 Secondary malignant neoplasm of unspecified site: Secondary | ICD-10-CM | POA: Insufficient documentation

## 2015-08-30 DIAGNOSIS — R591 Generalized enlarged lymph nodes: Secondary | ICD-10-CM | POA: Diagnosis present

## 2015-08-30 DIAGNOSIS — R590 Localized enlarged lymph nodes: Secondary | ICD-10-CM | POA: Diagnosis not present

## 2015-08-30 DIAGNOSIS — Z170 Estrogen receptor positive status [ER+]: Secondary | ICD-10-CM | POA: Insufficient documentation

## 2015-08-30 DIAGNOSIS — K219 Gastro-esophageal reflux disease without esophagitis: Secondary | ICD-10-CM | POA: Insufficient documentation

## 2015-08-30 DIAGNOSIS — E119 Type 2 diabetes mellitus without complications: Secondary | ICD-10-CM | POA: Diagnosis not present

## 2015-08-30 DIAGNOSIS — R531 Weakness: Secondary | ICD-10-CM | POA: Diagnosis not present

## 2015-08-30 DIAGNOSIS — C7951 Secondary malignant neoplasm of bone: Secondary | ICD-10-CM | POA: Diagnosis not present

## 2015-08-30 DIAGNOSIS — M549 Dorsalgia, unspecified: Secondary | ICD-10-CM | POA: Insufficient documentation

## 2015-08-30 DIAGNOSIS — M47812 Spondylosis without myelopathy or radiculopathy, cervical region: Secondary | ICD-10-CM | POA: Insufficient documentation

## 2015-08-30 DIAGNOSIS — Z7984 Long term (current) use of oral hypoglycemic drugs: Secondary | ICD-10-CM | POA: Insufficient documentation

## 2015-08-30 DIAGNOSIS — E785 Hyperlipidemia, unspecified: Secondary | ICD-10-CM | POA: Diagnosis not present

## 2015-08-30 DIAGNOSIS — G4700 Insomnia, unspecified: Secondary | ICD-10-CM | POA: Insufficient documentation

## 2015-08-30 DIAGNOSIS — Z7982 Long term (current) use of aspirin: Secondary | ICD-10-CM | POA: Insufficient documentation

## 2015-08-30 DIAGNOSIS — M542 Cervicalgia: Secondary | ICD-10-CM | POA: Diagnosis not present

## 2015-08-30 DIAGNOSIS — K5730 Diverticulosis of large intestine without perforation or abscess without bleeding: Secondary | ICD-10-CM | POA: Insufficient documentation

## 2015-08-30 DIAGNOSIS — Z9889 Other specified postprocedural states: Secondary | ICD-10-CM | POA: Insufficient documentation

## 2015-08-30 DIAGNOSIS — Z807 Family history of other malignant neoplasms of lymphoid, hematopoietic and related tissues: Secondary | ICD-10-CM | POA: Insufficient documentation

## 2015-08-30 DIAGNOSIS — R5383 Other fatigue: Secondary | ICD-10-CM | POA: Diagnosis not present

## 2015-08-30 DIAGNOSIS — Z79899 Other long term (current) drug therapy: Secondary | ICD-10-CM | POA: Diagnosis not present

## 2015-08-30 DIAGNOSIS — I2510 Atherosclerotic heart disease of native coronary artery without angina pectoris: Secondary | ICD-10-CM | POA: Diagnosis not present

## 2015-08-30 DIAGNOSIS — F329 Major depressive disorder, single episode, unspecified: Secondary | ICD-10-CM | POA: Diagnosis not present

## 2015-08-30 DIAGNOSIS — Z9012 Acquired absence of left breast and nipple: Secondary | ICD-10-CM | POA: Diagnosis not present

## 2015-08-30 DIAGNOSIS — N281 Cyst of kidney, acquired: Secondary | ICD-10-CM | POA: Diagnosis not present

## 2015-08-30 DIAGNOSIS — Z880 Allergy status to penicillin: Secondary | ICD-10-CM | POA: Insufficient documentation

## 2015-08-30 DIAGNOSIS — C50912 Malignant neoplasm of unspecified site of left female breast: Secondary | ICD-10-CM | POA: Diagnosis present

## 2015-08-30 DIAGNOSIS — F419 Anxiety disorder, unspecified: Secondary | ICD-10-CM | POA: Diagnosis not present

## 2015-08-30 DIAGNOSIS — Z853 Personal history of malignant neoplasm of breast: Secondary | ICD-10-CM | POA: Diagnosis not present

## 2015-08-30 DIAGNOSIS — I10 Essential (primary) hypertension: Secondary | ICD-10-CM | POA: Diagnosis not present

## 2015-08-30 DIAGNOSIS — M8580 Other specified disorders of bone density and structure, unspecified site: Secondary | ICD-10-CM | POA: Diagnosis not present

## 2015-08-30 DIAGNOSIS — Z803 Family history of malignant neoplasm of breast: Secondary | ICD-10-CM | POA: Insufficient documentation

## 2015-08-30 LAB — COMPREHENSIVE METABOLIC PANEL
ALBUMIN: 4.4 g/dL (ref 3.5–5.0)
ALT: 26 U/L (ref 14–54)
ANION GAP: 10 (ref 5–15)
AST: 30 U/L (ref 15–41)
Alkaline Phosphatase: 70 U/L (ref 38–126)
BILIRUBIN TOTAL: 0.6 mg/dL (ref 0.3–1.2)
BUN: 16 mg/dL (ref 6–20)
CALCIUM: 9.5 mg/dL (ref 8.9–10.3)
CO2: 27 mmol/L (ref 22–32)
Chloride: 99 mmol/L — ABNORMAL LOW (ref 101–111)
Creatinine, Ser: 0.97 mg/dL (ref 0.44–1.00)
GLUCOSE: 136 mg/dL — AB (ref 65–99)
POTASSIUM: 4.1 mmol/L (ref 3.5–5.1)
Sodium: 136 mmol/L (ref 135–145)
TOTAL PROTEIN: 7.7 g/dL (ref 6.5–8.1)

## 2015-08-30 LAB — CBC WITH DIFFERENTIAL/PLATELET
BASOS PCT: 1 %
Basophils Absolute: 0 10*3/uL (ref 0–0.1)
Eosinophils Absolute: 0.8 10*3/uL — ABNORMAL HIGH (ref 0–0.7)
Eosinophils Relative: 16 %
HEMATOCRIT: 40.8 % (ref 35.0–47.0)
HEMOGLOBIN: 13.4 g/dL (ref 12.0–16.0)
LYMPHS ABS: 1.2 10*3/uL (ref 1.0–3.6)
LYMPHS PCT: 25 %
MCH: 28.9 pg (ref 26.0–34.0)
MCHC: 32.9 g/dL (ref 32.0–36.0)
MCV: 88.1 fL (ref 80.0–100.0)
MONO ABS: 0.6 10*3/uL (ref 0.2–0.9)
MONOS PCT: 12 %
NEUTROS ABS: 2.3 10*3/uL (ref 1.4–6.5)
NEUTROS PCT: 46 %
Platelets: 271 10*3/uL (ref 150–440)
RBC: 4.63 MIL/uL (ref 3.80–5.20)
RDW: 13.8 % (ref 11.5–14.5)
WBC: 5 10*3/uL (ref 3.6–11.0)

## 2015-08-30 LAB — PROTIME-INR
INR: 0.93
PROTHROMBIN TIME: 12.5 s (ref 11.4–15.2)

## 2015-08-30 MED ORDER — MIDAZOLAM HCL 5 MG/5ML IJ SOLN
INTRAMUSCULAR | Status: AC
  Filled 2015-08-30: qty 5

## 2015-08-30 MED ORDER — SODIUM CHLORIDE 0.9 % IV SOLN: INTRAVENOUS | Status: DC

## 2015-08-30 MED ORDER — FENTANYL CITRATE (PF) 100 MCG/2ML IJ SOLN
INTRAMUSCULAR | Status: AC
  Filled 2015-08-30: qty 2

## 2015-08-30 NOTE — H&P (Signed)
Chief Complaint: Patient was seen in consultation today for left neck biopsy at the request of Pabon,Diego F  Referring Physician(s): Pabon,Diego F  Patient Status: Outpatient  History of Present Illness: Amanda [name] is a 65 y.o. 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  She recently noticed a tender lump in left neck.  She had a neck CT that raised concern for 2.1 cm metastatic lymph node.  Patient presents for US-guided biopsy of the left neck lesion.  No complaints today.  No fevers, chills, chest pain, abdominal pain or difficultly breathing.  Past Medical History:  Diagnosis Date  . Back pain    occasionally  . Breast cancer ([place])   . Depression    takes Paxil and Wellbutrin daily  . Diabetes ([place])    takes Metformin daily  . GERD (gastroesophageal reflux disease)   . History of bronchitis 2 yrs ago  . Hyperlipidemia    takes Atorvastatin daily  . Hypertension    takes Losartan-HCTZ daily  . Insomnia    takes Ambien nightly  . Insomnia    takes gabapentin nightly  . Joint pain   . Migraine   . Mood swings ([place])   . Osteoarthritis of knee   . PONV (postoperative nausea and vomiting)   . Seasonal allergies    takes Allegra daily    Past Surgical History:  Procedure Laterality Date  . BREAST BIOPSY  2009  . cataract surgery Bilateral   . COLONOSCOPY    . JOINT REPLACEMENT Left 2014   knee  . KNEE ARTHROSCOPY Left    x 5  . MASTECTOMY Left   . port a cath placed    . TOTAL SHOULDER ARTHROPLASTY Right 06/03/2015   Procedure: TOTAL SHOULDER ARTHROPLASTY;  Surgeon: Tania Ade, MD;  Location: [facility];  Service: Orthopedics;  Laterality: Right;  Right total shoulder arthroplasty  . TOTAL SHOULDER REPLACEMENT Right 06/03/2015  . TUBAL LIGATION      Allergies: Ace inhibitors; Latex; Morphine and related; Other; and Penicillins  Medications: Prior to Admission medications   Medication Sig  Start Date End Date Taking? Authorizing Provider  aspirin EC 81 MG tablet Take 81 mg by mouth daily.    Yes Historical Provider, MD  atorvastatin (LIPITOR) 10 MG tablet Take 10 mg by mouth daily.  08/08/13  Yes Historical Provider, MD  buPROPion (WELLBUTRIN XL) 150 MG 24 hr tablet Take 150 mg by mouth daily.  08/08/13  Yes Historical Provider, MD  Calcium Carbonate-Vit D-Min (CALCIUM 600+D PLUS MINERALS) 600-400 MG-UNIT TABS Take by mouth.   Yes Historical Provider, MD  fexofenadine (ALLEGRA) 180 MG tablet Take 180 mg by mouth daily.    Yes Historical Provider, MD  gabapentin (NEURONTIN) 300 MG capsule Take 300 mg by mouth at bedtime. 05/25/14  Yes Historical Provider, MD  HYDROmorphone (DILAUDID) 2 MG tablet Take 0.5-1 tablets (1-2 mg total) by mouth every 4 (four) hours as needed for severe pain. 06/04/15  Yes Danielle Laliberte, PA-C  losartan-hydrochlorothiazide (HYZAAR) 100-25 MG per tablet Take 1 tablet by mouth daily.  07/27/14  Yes Historical Provider, MD  metFORMIN (GLUCOPHAGE) 850 MG tablet Take 850 mg by mouth 2 (two) times daily with a meal.    Yes Historical Provider, MD  PARoxetine (PAXIL) 40 MG tablet Take 40 mg by mouth daily.  08/08/13  Yes Historical Provider, MD     History reviewed. No pertinent family history.  Social History  Social History  . Marital status: Married    Spouse name: N/A  . Number of children: N/A  . Years of education: N/A   Social History Main Topics  . Smoking status: Never Smoker  . Smokeless tobacco: Never Used  . Alcohol use 0.0 oz/week     Comment: occasionally wine  . Drug use: No  . Sexual activity: Yes    Birth control/ protection: Post-menopausal   Other Topics Concern  . None   Social History Narrative  . None    Review of Systems: A 12 point ROS discussed and pertinent positives are indicated in the HPI above.  All other systems are negative.  Review of Systems  Constitutional: Negative for chills and fever.  Respiratory:  Negative.   Cardiovascular: Negative.   Gastrointestinal: Negative.   Genitourinary: Negative.     Vital Signs: BP 122/76   Pulse 68   Temp 98.7 F (37.1 C)   Resp (!) 23   SpO2 95%   Physical Exam  Neck:    Cardiovascular: Normal rate, regular rhythm and normal heart sounds.   Pulmonary/Chest: Effort normal and breath sounds normal. No respiratory distress. She has no wheezes.  Abdominal: Soft. Bowel sounds are normal.    Mallampati Score:  MD Evaluation Airway: WNL Heart: WNL Abdomen: WNL Chest/ Lungs: WNL ASA  Classification: 2 Mallampati/Airway Score: Two  Imaging: Ct Soft Tissue Neck W Contrast  Result Date: 08/27/2015 CLINICAL DATA:  Left neck mass. Remote history of left-sided breast cancer. EXAM: CT NECK WITH CONTRAST TECHNIQUE: Multidetector CT imaging of the neck was performed using the standard protocol following the bolus administration of intravenous contrast. CONTRAST:  100 mL Isovue 300 COMPARISON:  None. FINDINGS: Pharynx and larynx: No focal mucosal or submucosal lesions are present. Vocal cords are midline and symmetric. The nasopharynx, oropharynx, and hypopharynx are within normal limits. Salivary glands: The submandibular and parotid glands are within normal limits bilaterally. Thyroid: Negative Lymph nodes: An infiltrative soft tissue mass in the lateral left neck measures 2.1 x 1.6 x 2.0 cm. There is a second more medial 10 mm lesion just lateral to the left internal jugular vein at the level 4 station. No other significant cervical adenopathy is present. Vascular: No significant vascular lesions are present. With atherosclerotic calcifications are present at the carotid bifurcations bilaterally without significant stenosis. Limited intracranial: Within normal limits. Visualized orbits: Bilateral lens extractions are present. The globes and orbits are otherwise within normal limits. Mastoids and visualized paranasal sinuses: Clear Skeleton: Chronic endplate  change, loss of disc height, and uncovertebral spurring is present at C5-6 and C6-7 with osseous foraminal narrowing at both levels. There is some straightening of the normal cervical lordosis. No focal lytic or blastic lesions are present. Upper chest: The lung apices are clear. IMPRESSION: 1. 2.1 cm ill-defined left lateral level 5 neck mass. This is most concerning for a metastatic nodal neck mass. This could be related to the patient's breast cancer. 2. A second 10 mm lesion is some rolled density is present more medially adjacent to the left internal jugular vein at the level 4 station. This is also worrisome for metastatic nodal disease. 3. Degenerate spondylosis of the cervical spine is most pronounced at C5-6 and C6-7 with osseous foraminal narrowing at both levels. Electronically Signed   By: San Morelle M.D.   On: 08/27/2015 13:50   Ct Chest W Contrast  Result Date: 08/27/2015 CLINICAL DATA:  65 year old female with a history of inflammatory left breast cancer  status post left mastectomy presents with left neck mass. EXAM: CT CHEST, ABDOMEN, AND PELVIS WITH CONTRAST TECHNIQUE: Multidetector CT imaging of the chest, abdomen and pelvis was performed following the standard protocol during bolus administration of intravenous contrast. CONTRAST:  100 cc Isovue-300 IV. COMPARISON:  12/30/2009 chest CT. 02/13/2008 PET-CT. 10/08/2007 CT chest, abdomen and pelvis. FINDINGS: CT CHEST FINDINGS Mediastinum/Nodes: Top-normal heart size. No significant pericardial fluid/thickening. Left anterior descending and right coronary atherosclerosis. Atherosclerotic nonaneurysmal thoracic aorta. Normal caliber pulmonary arteries. No central pulmonary emboli. No discrete thyroid nodules. Unremarkable esophagus. There is enlarged 2.2 cm left level 5 neck lymph node (series 2/image 8), which demonstrates ill-defined margins. There is a mildly enlarged 1.1 cm subcarinal node (series 2/ image 32), new since 2011. No  axillary adenopathy. No additional pathologically enlarged mediastinal or hilar nodes. Lungs/Pleura: No pneumothorax. No pleural effusion. No acute consolidative airspace disease, lung masses or significant pulmonary nodules. There is patchy peribronchovascular and subpleural ground-glass attenuation and subpleural reticulation throughout both lungs without craniocaudal gradient, new since 2011. No significant traction bronchiectasis. No frank honeycombing. 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 Musculoskeletal: No aggressive appearing focal osseous lesions. Mild thoracic spondylosis. Partially visualized right total shoulder arthroplasty. Mildly thick walled subcutaneous fluid collection in the left chest wall at the mastectomy site is not appreciably changed since 2011, most consistent with a postoperative seroma. CT ABDOMEN PELVIS FINDINGS Hepatobiliary: Normal liver size. Suggestion of diffuse hepatic steatosis. No liver mass. Normal gallbladder with no radiopaque cholelithiasis. No biliary ductal dilatation. Pancreas: Normal, with no mass or duct dilation. Spleen: Normal size. No mass. Adrenals/Urinary Tract: Normal adrenals. No renal mass. No hydronephrosis. Parapelvic renal cysts in the left renal sinus. Normal bladder. Stomach/Bowel: Grossly normal stomach. Normal caliber small bowel with no small bowel wall thickening. Normal appendix. Mild sigmoid diverticulosis, with no large bowel wall thickening or pericolonic fat stranding. Vascular/Lymphatic: Atherosclerotic nonaneurysmal abdominal aorta. Patent portal, splenic, hepatic and renal veins. No pathologically enlarged lymph nodes in the abdomen or pelvis. Reproductive: Grossly normal uterus.  No adnexal mass. Other: No pneumoperitoneum, ascites or focal fluid collection. Musculoskeletal: No aggressive appearing focal osseous lesions. Moderate lumbar spondylosis. IMPRESSION: 1.  Left level 5 neck lymphadenopathy with infiltrative appearing margins, worrisome for nodal metastasis. Please see the separate dedicated neck CT report for further details. Tissue sampling advised. 2. Mild subcarinal lymphadenopathy, new since 2011, nodal metastasis not excluded. 3. No additional potential sites of metastatic disease in the chest, abdomen or pelvis. 4. Spectrum of findings in the lungs suggestive of an interstitial lung disease such as nonspecific interstitial pneumonia (NSIP). If clinically warranted, a follow-up high-resolution chest CT study in 6-12 months is recommended to assess for pattern stability. 5. Additional findings include aortic atherosclerosis, 2 vessel coronary atherosclerosis, chronic subcutaneous postoperative seroma at the left mastectomy site, probable diffuse hepatic steatosis and mild sigmoid diverticulosis. Electronically Signed   By: Ilona Sorrel M.D.   On: 08/27/2015 13:20   Mr Jeri Cos ON Contrast  Result Date: 08/25/2015 CLINICAL DATA:  65 year old female with breast cancer, cervical lymphadenopathy. Staging. Subsequent encounter. EXAM: MRI HEAD WITHOUT AND WITH CONTRAST TECHNIQUE: Multiplanar, multiecho pulse sequences of the brain and surrounding structures were obtained without and with intravenous contrast. CONTRAST:  27m MULTIHANCE GADOBENATE DIMEGLUMINE 529 MG/ML IV SOLN COMPARISON:  Head CT without contrast 10/04/2005. FINDINGS: No midline shift, mass effect, or evidence of intracranial mass lesion. No abnormal enhancement identified; incidental inferior right temporal  lobe developmental venous anomaly (normal variant - series 14, image 7). No dural thickening. Visible bone marrow signal is normal. Negative visualized cervical spine and spinal cord. No restricted diffusion to suggest acute infarction. No ventriculomegaly, extra-axial collection or acute intracranial hemorrhage. Cervicomedullary junction and pituitary are within normal limits. Major intracranial  vascular flow voids are preserved, dominant appearing distal right vertebral artery. Pearline Cables and white matter signal is within normal limits for age throughout the brain. No chronic cerebral blood products. Visible internal auditory structures appear normal. Visualized paranasal sinuses and mastoids are stable and well pneumatized. Postoperative changes to both globes. Negative orbit and scalp soft tissues. IMPRESSION: Normal for age MRI appearance of the brain. No metastatic disease identified. Electronically Signed   By: Genevie Ann M.D.   On: 08/25/2015 14:34   Ct Abdomen Pelvis W Contrast  Result Date: 08/27/2015 CLINICAL DATA:  65 year old female with a history of inflammatory left breast cancer status post left mastectomy presents with left neck mass. EXAM: CT CHEST, ABDOMEN, AND PELVIS WITH CONTRAST TECHNIQUE: Multidetector CT imaging of the chest, abdomen and pelvis was performed following the standard protocol during bolus administration of intravenous contrast. CONTRAST:  100 cc Isovue-300 IV. COMPARISON:  12/30/2009 chest CT. 02/13/2008 PET-CT. 10/08/2007 CT chest, abdomen and pelvis. FINDINGS: CT CHEST FINDINGS Mediastinum/Nodes: Top-normal heart size. No significant pericardial fluid/thickening. Left anterior descending and right coronary atherosclerosis. Atherosclerotic nonaneurysmal thoracic aorta. Normal caliber pulmonary arteries. No central pulmonary emboli. No discrete thyroid nodules. Unremarkable esophagus. There is enlarged 2.2 cm left level 5 neck lymph node (series 2/image 8), which demonstrates ill-defined margins. There is a mildly enlarged 1.1 cm subcarinal node (series 2/ image 32), new since 2011. No axillary adenopathy. No additional pathologically enlarged mediastinal or hilar nodes. Lungs/Pleura: No pneumothorax. No pleural effusion. No acute consolidative airspace disease, lung masses or significant pulmonary nodules. There is patchy peribronchovascular and subpleural ground-glass  attenuation and subpleural reticulation throughout both lungs without craniocaudal gradient, new since 2011. No significant traction bronchiectasis. No frank honeycombing. 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 Musculoskeletal: No aggressive appearing focal osseous lesions. Mild thoracic spondylosis. Partially visualized right total shoulder arthroplasty. Mildly thick walled subcutaneous fluid collection in the left chest wall at the mastectomy site is not appreciably changed since 2011, most consistent with a postoperative seroma. CT ABDOMEN PELVIS FINDINGS Hepatobiliary: Normal liver size. Suggestion of diffuse hepatic steatosis. No liver mass. Normal gallbladder with no radiopaque cholelithiasis. No biliary ductal dilatation. Pancreas: Normal, with no mass or duct dilation. Spleen: Normal size. No mass. Adrenals/Urinary Tract: Normal adrenals. No renal mass. No hydronephrosis. Parapelvic renal cysts in the left renal sinus. Normal bladder. Stomach/Bowel: Grossly normal stomach. Normal caliber small bowel with no small bowel wall thickening. Normal appendix. Mild sigmoid diverticulosis, with no large bowel wall thickening or pericolonic fat stranding. Vascular/Lymphatic: Atherosclerotic nonaneurysmal abdominal aorta. Patent portal, splenic, hepatic and renal veins. No pathologically enlarged lymph nodes in the abdomen or pelvis. Reproductive: Grossly normal uterus.  No adnexal mass. Other: No pneumoperitoneum, ascites or focal fluid collection. Musculoskeletal: No aggressive appearing focal osseous lesions. Moderate lumbar spondylosis. IMPRESSION: 1. Left level 5 neck lymphadenopathy with infiltrative appearing margins, worrisome for nodal metastasis. Please see the separate dedicated neck CT report for further details. Tissue sampling advised. 2. Mild subcarinal lymphadenopathy, new since 2011, nodal metastasis not excluded. 3. No  additional potential sites of metastatic disease in the chest, abdomen or pelvis. 4. Spectrum of findings  in the lungs suggestive of an interstitial lung disease such as nonspecific interstitial pneumonia (NSIP). If clinically warranted, a follow-up high-resolution chest CT study in 6-12 months is recommended to assess for pattern stability. 5. Additional findings include aortic atherosclerosis, 2 vessel coronary atherosclerosis, chronic subcutaneous postoperative seroma at the left mastectomy site, probable diffuse hepatic steatosis and mild sigmoid diverticulosis. Electronically Signed   By: Ilona Sorrel M.D.   On: 08/27/2015 13:20    Labs:  CBC:  Recent Labs  05/26/15 1037 06/04/15 0658 07/28/15 1010  WBC 6.1 6.0 4.0  HGB 13.7 11.1* 12.9  HCT 41.9 35.8* 38.4  PLT 297 237 316    COAGS:  Recent Labs  05/26/15 1037  INR 0.95  APTT 27    BMP:  Recent Labs  05/26/15 1037 07/28/15 1010  NA 140 137  K 4.4 4.3  CL 102 100*  CO2 28 29  GLUCOSE 115* 142*  BUN 16 22*  CALCIUM 9.9 9.7  CREATININE 0.89 0.93  GFRNONAA >60 >60  GFRAA >60 >60    LIVER FUNCTION TESTS:  Recent Labs  05/26/15 1037 07/28/15 1010  BILITOT 0.5 0.7  AST 34 46*  ALT 29 34  ALKPHOS 60 73  PROT 6.5 7.7  ALBUMIN 3.9 4.3    TUMOR MARKERS: No results for input(s): AFPTM, CEA, CA199, CHROMGRNA in the last 8760 hours.  Assessment and Plan:  65 yo with history of breast cancer and new tender lump in left lower neck.  Patient needs tissue sampling.  Discussed US guided biopsy of this lesion.  Informed consent obtained from patient.  Will try procedure without moderate sedation.    Thank you for this interesting consult.  I greatly enjoyed meeting Amanda [name] and look forward to participating in their care.  A copy of this report was sent to the requesting provider on this date.  Electronically Signed: Carylon Perches 08/30/2015, 7:55 AM    I spent a total of  15 Minutes  in face to face  in clinical consultation, greater than 50% of which was counseling/coordinating care for the left neck biopsy.

## 2015-08-30 NOTE — Procedures (Signed)
US guided core biopsies of left lower neck mass.  5 cores obtained.  Minimal blood loss and no immediate complication.  See full report in PACS.

## 2015-08-31 ENCOUNTER — Telehealth: Payer: Self-pay | Admitting: Surgery

## 2015-08-31 NOTE — Telephone Encounter (Signed)
Spoke with Nurse reviewer with [employer] at this time. She has updated notes and sent back to Dr. For Approval. We will have decision by close of business tomorrow via fax. If this has not been received, call [phone] with reference [id number]. CPT: [id number].  Call made to Central scheduling to reschedule this at this time. Spoke with Manuela Schwartz. Rescheduled for Friday 09/03/15 at 1130am and arrive at 1100am.  Call made to patient to update on Insurance situation and to give new appointment information. She verbalizes understanding of conversation.

## 2015-08-31 NOTE — Telephone Encounter (Signed)
See other encounter notes

## 2015-08-31 NOTE — Telephone Encounter (Signed)
Patient would like to make sure her PET scan has been approved for tomorrow. I wasn't sure where to look.

## 2015-09-01 ENCOUNTER — Ambulatory Visit: Payer: Managed Care, Other (non HMO)

## 2015-09-01 LAB — SURGICAL PATHOLOGY

## 2015-09-02 NOTE — Telephone Encounter (Signed)
Call made to [employer] at this time. Spoke with Nurse reviewer Izora Gala. She states that patient's PET scan is still in reconsideration stage. I provided recently resulted Biopsy results to her. She has placed notes in chart but cannot guarantee a decision until 09/09/15.   PET scan has been rescheduled for 09/08/15 at 1130. Arrive at 1100am.  Also, spoke with Dr. Dahlia Byes, reviewed pathology and he would like to see patient on Monday and have Oncology appointment with Dr. Grayland Ormond scheduled.  Call made to both of [facility]'s nurses at this time. No answer. Will call back at a later time.

## 2015-09-02 NOTE — Telephone Encounter (Signed)
Spoke with patient at this time. Explained to patient that all results are in from testing thus far and that Dr. Dahlia Byes would like to see her on Monday.  Appointment made. Also given appointment information to see Dr. Grayland Ormond and PET rescheduled information.

## 2015-09-03 ENCOUNTER — Telehealth: Payer: Self-pay

## 2015-09-03 NOTE — Telephone Encounter (Signed)
Patient wants Amber to give her a call at [phone].Patient wants to know if she would like her husband to call [employer]. She said it would be guaranteed that [facility] will be paying for her pet scan on Wednesday if he does.

## 2015-09-06 ENCOUNTER — Ambulatory Visit (INDEPENDENT_AMBULATORY_CARE_PROVIDER_SITE_OTHER): Payer: 59 | Admitting: Surgery

## 2015-09-06 ENCOUNTER — Encounter: Payer: Self-pay | Admitting: Surgery

## 2015-09-06 VITALS — BP 160/70 | HR 95 | Temp 99.2°F | Ht 65.0 in | Wt 211.2 lb

## 2015-09-06 DIAGNOSIS — C50919 Malignant neoplasm of unspecified site of unspecified female breast: Secondary | ICD-10-CM | POA: Diagnosis not present

## 2015-09-06 DIAGNOSIS — C799 Secondary malignant neoplasm of unspecified site: Secondary | ICD-10-CM | POA: Diagnosis not present

## 2015-09-06 NOTE — Telephone Encounter (Signed)
Spoke with patient at this time. Explained that this is still denied to this point per insurance company this morning. Dr. Dahlia Byes has been asked to Dr. Peer to Peer for this patient and I explained that he may have a better chance of getting this approved.  Patient insists on having her husband contact the insurance company.  Will see patient to go over all results this afternoon.

## 2015-09-06 NOTE — Patient Instructions (Signed)
Please follow-up with Oncology as scheduled below.  We will see you back in the office as scheduled below. If you have any questions or concerns, please call our office.  We are still awaiting approval of your PET Scan, I will keep you posted on this.

## 2015-09-08 ENCOUNTER — Ambulatory Visit
Admission: RE | Admit: 2015-09-08 | Discharge: 2015-09-08 | Disposition: A | Discharge status: Home / Self Care | Payer: Managed Care, Other (non HMO) | Source: Ambulatory Visit | Attending: Surgery | Admitting: Surgery

## 2015-09-08 DIAGNOSIS — C770 Secondary and unspecified malignant neoplasm of lymph nodes of head, face and neck: Secondary | ICD-10-CM | POA: Diagnosis not present

## 2015-09-08 DIAGNOSIS — R591 Generalized enlarged lymph nodes: Secondary | ICD-10-CM | POA: Diagnosis present

## 2015-09-08 DIAGNOSIS — Z853 Personal history of malignant neoplasm of breast: Secondary | ICD-10-CM | POA: Diagnosis present

## 2015-09-08 DIAGNOSIS — C7951 Secondary malignant neoplasm of bone: Secondary | ICD-10-CM | POA: Diagnosis not present

## 2015-09-08 LAB — GLUCOSE, CAPILLARY: GLUCOSE-CAPILLARY: 118 mg/dL — AB (ref 65–99)

## 2015-09-08 MED ORDER — FLUDEOXYGLUCOSE F - 18 (FDG) INJECTION
12.0000 | Freq: Once | INTRAVENOUS | Status: AC | PRN
  Administered 2015-09-08: 12.5 via INTRAVENOUS

## 2015-09-08 NOTE — Progress Notes (Signed)
Amanda [name] is following up after ultrasound guided biopsy show evidence of metastatic disease on her neck. Previous CT scan also personal review and there is evidence of metastatic disease to the neck and there is some abnormal new lymphadenopathy on her mediastinum. Discussed with the patient about the pathology and the CT scan results. We'll make sure that she will get appointments to both radiation oncology and medical oncology. I have also discussed with her insurance company in detail about the need for the PET/CT in case there is mediastinal disease. Says it counseling provided I spent at least 30 minutes with Majority of time spent in the coordination of care and counseling the patient. I will see her back in a few weeks and I will be happy to put a port if this is what may be needed to control her disease.

## 2015-09-08 NOTE — Progress Notes (Addendum)
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  INTERVAL HISTORY: Patient returns to clinic today to discuss her pathology and imaging results as well as treatment planning. She has some neck pain at the site of her new lymphadenopathy, but otherwise feels well. She has no neurologic complaints. She denies any recent fevers or illnesses.  She denies any chest pain or shortness of breath.  She denies any nausea, vomiting, constipation, or diarrhea.  She has no urinary complaints.  Patient offers no further specific complaints today.  REVIEW OF SYSTEMS:   Review of Systems  Constitutional: Negative.  Negative for fever, malaise/fatigue and weight loss.  Respiratory: Negative.  Negative for cough and shortness of breath.   Cardiovascular: Negative.  Negative for chest pain.  Gastrointestinal: Negative.  Negative for abdominal pain.  Genitourinary: Negative.   Musculoskeletal: Positive for neck pain.  Neurological: Negative.  Negative for sensory change and weakness.  Endo/Heme/Allergies: Negative.   Psychiatric/Behavioral: The patient is nervous/anxious.     As per HPI. Otherwise, a complete review of systems is negatve.  PAST MEDICAL HISTORY: Past Medical History:  Diagnosis Date  . Back pain    occasionally  . Breast cancer ([place])   . Depression    takes Paxil and Wellbutrin daily  . Diabetes ([place])    takes Metformin daily  . GERD (gastroesophageal reflux disease)   . History of bronchitis 2 yrs ago  . Hyperlipidemia    takes Atorvastatin daily  . Hypertension    takes Losartan-HCTZ daily    . Insomnia    takes Ambien nightly  . Insomnia    takes gabapentin nightly  . Joint pain   . Migraine   . Mood swings ([place])   . Osteoarthritis of knee   . PONV (postoperative nausea and vomiting)   . Seasonal allergies    takes Allegra daily    PAST SURGICAL HISTORY: Past Surgical History:  Procedure Laterality Date  . BREAST BIOPSY  2009  . cataract surgery Bilateral   . COLONOSCOPY    . JOINT REPLACEMENT Left 2014   knee  . KNEE ARTHROSCOPY Left    x 5  . MASTECTOMY Left   . port a cath placed    . TOTAL SHOULDER ARTHROPLASTY Right 06/03/2015   Procedure: TOTAL SHOULDER ARTHROPLASTY;  Surgeon: Tania Ade, MD;  Location: [facility];  Service: Orthopedics;  Laterality: Right;  Right total shoulder arthroplasty  . TOTAL SHOULDER REPLACEMENT Right 06/03/2015  . TUBAL LIGATION      FAMILY HISTORY: Father with non-Hodgkin's lymphoma, 2 paternal aunts with breast cancer.     ADVANCED DIRECTIVES:    HEALTH MAINTENANCE: Social History  Substance Use Topics  . Smoking status: Never Smoker  . Smokeless tobacco: Never Used  . Alcohol use 0.0 oz/week     Comment: occasionally wine     Colonoscopy:  PAP:  Bone density:  Lipid panel:  Allergies  Allergen Reactions  . Ace Inhibitors Cough  . Latex Itching  . Morphine And Related Itching    Caused her to itch terribly. Would prefer if given to take with a benadryl  . Other Itching    Freeze spray.  Patient stated that she may be able to use it now because she doesn't use it as often.  Marland Kitchen Penicillins Rash    Has patient had a PCN reaction causing immediate rash, facial/tongue/throat swelling, SOB or lightheadedness with hypotension: No Has patient had a PCN reaction causing severe rash involving mucus membranes or skin necrosis: No Has patient had a PCN reaction that required hospitalization No Has patient had a PCN reaction occurring within the last 10 years: No If all of the above answers are "NO", then may proceed  with Cephalosporin use.    Current Outpatient Prescriptions  Medication Sig Dispense Refill  . aspirin EC 81 MG tablet Take 81 mg by mouth daily.     Marland Kitchen atorvastatin (LIPITOR) 10 MG tablet Take 10 mg by mouth daily.     Marland Kitchen buPROPion (WELLBUTRIN XL) 150 MG 24 hr tablet Take 150 mg by mouth daily.     . Calcium Carbonate-Vit D-Min (CALCIUM 600+D PLUS MINERALS) 600-400 MG-UNIT TABS Take by mouth.    . fexofenadine (ALLEGRA) 180 MG tablet Take 180 mg by mouth daily.     Marland Kitchen gabapentin (NEURONTIN) 300 MG capsule Take 300 mg by mouth at bedtime.  1  . losartan-hydrochlorothiazide (HYZAAR) 100-25 MG per tablet Take 1 tablet by mouth daily.     . metFORMIN (GLUCOPHAGE) 850 MG tablet Take 850 mg by mouth 2 (two) times daily with a meal.     . PARoxetine (PAXIL) 40 MG tablet Take 40 mg by mouth daily.      No current facility-administered medications for this visit.     OBJECTIVE: Vitals:   09/09/15 1050  BP: 132/77  Pulse: 90  Resp: 18  Temp: (!) 96.8 F (36 C)     Body mass index is 35.27 kg/m.    ECOG FS:0 - Asymptomatic  General: Well-developed, well-nourished, no acute distress. Eyes: Pink conjunctiva, anicteric sclera. HEENT: Easily palpable left neck lymphadenopathy. Breast: Left chest wall without evidence of recurrence.  Right breast and axilla without lumps or masses Lungs: Clear to auscultation bilaterally. Heart: Regular rate and rhythm. No rubs, murmurs, or gallops. Abdomen: Soft, nontender, nondistended. No organomegaly noted, normoactive bowel sounds. Musculoskeletal: No edema, cyanosis, or clubbing. Neuro: Alert, answering all questions appropriately. Cranial nerves grossly intact. Skin: No rashes or petechiae noted. Psych: Normal affect.   LAB RESULTS:  Lab Results  Component Value Date   NA 136 08/30/2015   K 4.1 08/30/2015   CL 99 (L) 08/30/2015   CO2 27 08/30/2015   GLUCOSE 136 (H) 08/30/2015   BUN 16 08/30/2015   CREATININE 0.97 08/30/2015   CALCIUM 9.5  08/30/2015   PROT 7.7 08/30/2015   ALBUMIN 4.4 08/30/2015   AST 30 08/30/2015   ALT 26 08/30/2015   ALKPHOS 70 08/30/2015   BILITOT 0.6 08/30/2015   GFRNONAA >60 08/30/2015   GFRAA >60 08/30/2015    Lab Results  Component Value Date   WBC 5.0 08/30/2015   NEUTROABS 2.3 08/30/2015   HGB 13.4 08/30/2015   HCT 40.8 08/30/2015   MCV 88.1 08/30/2015   PLT 271 08/30/2015     STUDIES: Ct Soft Tissue Neck W Contrast  Result Date: 08/27/2015 CLINICAL DATA:  Left neck mass. Remote history of left-sided breast cancer. EXAM: CT NECK WITH CONTRAST TECHNIQUE: Multidetector CT imaging of the neck was performed using the standard protocol following the bolus administration of intravenous contrast. CONTRAST:  100 mL Isovue 300 COMPARISON:  None. FINDINGS: Pharynx and larynx: No focal mucosal  or submucosal lesions are present. Vocal cords are midline and symmetric. The nasopharynx, oropharynx, and hypopharynx are within normal limits. Salivary glands: The submandibular and parotid glands are within normal limits bilaterally. Thyroid: Negative Lymph nodes: An infiltrative soft tissue mass in the lateral left neck measures 2.1 x 1.6 x 2.0 cm. There is a second more medial 10 mm lesion just lateral to the left internal jugular vein at the level 4 station. No other significant cervical adenopathy is present. Vascular: No significant vascular lesions are present. With atherosclerotic calcifications are present at the carotid bifurcations bilaterally without significant stenosis. Limited intracranial: Within normal limits. Visualized orbits: Bilateral lens extractions are present. The globes and orbits are otherwise within normal limits. Mastoids and visualized paranasal sinuses: Clear Skeleton: Chronic endplate change, loss of disc height, and uncovertebral spurring is present at C5-6 and C6-7 with osseous foraminal narrowing at both levels. There is some straightening of the normal cervical lordosis. No focal  lytic or blastic lesions are present. Upper chest: The lung apices are clear. IMPRESSION: 1. 2.1 cm ill-defined left lateral level 5 neck mass. This is most concerning for a metastatic nodal neck mass. This could be related to the patient's breast cancer. 2. A second 10 mm lesion is some rolled density is present more medially adjacent to the left internal jugular vein at the level 4 station. This is also worrisome for metastatic nodal disease. 3. Degenerate spondylosis of the cervical spine is most pronounced at C5-6 and C6-7 with osseous foraminal narrowing at both levels. Electronically Signed   By: San Morelle M.D.   On: 08/27/2015 13:50   Ct Chest W Contrast  Result Date: 08/27/2015 CLINICAL DATA:  65 year old female with a history of inflammatory left breast cancer status post left mastectomy presents with left neck mass. EXAM: CT CHEST, ABDOMEN, AND PELVIS WITH CONTRAST TECHNIQUE: Multidetector CT imaging of the chest, abdomen and pelvis was performed following the standard protocol during bolus administration of intravenous contrast. CONTRAST:  100 cc Isovue-300 IV. COMPARISON:  12/30/2009 chest CT. 02/13/2008 PET-CT. 10/08/2007 CT chest, abdomen and pelvis. FINDINGS: CT CHEST FINDINGS Mediastinum/Nodes: Top-normal heart size. No significant pericardial fluid/thickening. Left anterior descending and right coronary atherosclerosis. Atherosclerotic nonaneurysmal thoracic aorta. Normal caliber pulmonary arteries. No central pulmonary emboli. No discrete thyroid nodules. Unremarkable esophagus. There is enlarged 2.2 cm left level 5 neck lymph node (series 2/image 8), which demonstrates ill-defined margins. There is a mildly enlarged 1.1 cm subcarinal node (series 2/ image 32), new since 2011. No axillary adenopathy. No additional pathologically enlarged mediastinal or hilar nodes. Lungs/Pleura: No pneumothorax. No pleural effusion. No acute consolidative airspace disease, lung masses or significant  pulmonary nodules. There is patchy peribronchovascular and subpleural ground-glass attenuation and subpleural reticulation throughout both lungs without craniocaudal gradient, new since 2011. No significant traction bronchiectasis. No frank honeycombing. 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 Musculoskeletal: No aggressive appearing focal osseous lesions. Mild thoracic spondylosis. Partially visualized right total shoulder arthroplasty. Mildly thick walled subcutaneous fluid collection in the left chest wall at the mastectomy site is not appreciably changed since 2011, most consistent with a postoperative seroma. CT ABDOMEN PELVIS FINDINGS Hepatobiliary: Normal liver size. Suggestion of diffuse hepatic steatosis. No liver mass. Normal gallbladder with no radiopaque cholelithiasis. No biliary ductal dilatation. Pancreas: Normal, with no mass or duct dilation. Spleen: Normal size. No mass. Adrenals/Urinary Tract: Normal adrenals. No renal mass. No hydronephrosis. Parapelvic renal cysts in the left renal  sinus. Normal bladder. Stomach/Bowel: Grossly normal stomach. Normal caliber small bowel with no small bowel wall thickening. Normal appendix. Mild sigmoid diverticulosis, with no large bowel wall thickening or pericolonic fat stranding. Vascular/Lymphatic: Atherosclerotic nonaneurysmal abdominal aorta. Patent portal, splenic, hepatic and renal veins. No pathologically enlarged lymph nodes in the abdomen or pelvis. Reproductive: Grossly normal uterus.  No adnexal mass. Other: No pneumoperitoneum, ascites or focal fluid collection. Musculoskeletal: No aggressive appearing focal osseous lesions. Moderate lumbar spondylosis. IMPRESSION: 1. Left level 5 neck lymphadenopathy with infiltrative appearing margins, worrisome for nodal metastasis. Please see the separate dedicated neck CT report for further details. Tissue sampling advised. 2. Mild  subcarinal lymphadenopathy, new since 2011, nodal metastasis not excluded. 3. No additional potential sites of metastatic disease in the chest, abdomen or pelvis. 4. Spectrum of findings in the lungs suggestive of an interstitial lung disease such as nonspecific interstitial pneumonia (NSIP). If clinically warranted, a follow-up high-resolution chest CT study in 6-12 months is recommended to assess for pattern stability. 5. Additional findings include aortic atherosclerosis, 2 vessel coronary atherosclerosis, chronic subcutaneous postoperative seroma at the left mastectomy site, probable diffuse hepatic steatosis and mild sigmoid diverticulosis. Electronically Signed   By: Ilona Sorrel M.D.   On: 08/27/2015 13:20   Mr Jeri Cos HK Contrast  Result Date: 08/25/2015 CLINICAL DATA:  65 year old female with breast cancer, cervical lymphadenopathy. Staging. Subsequent encounter. EXAM: MRI HEAD WITHOUT AND WITH CONTRAST TECHNIQUE: Multiplanar, multiecho pulse sequences of the brain and surrounding structures were obtained without and with intravenous contrast. CONTRAST:  40m MULTIHANCE GADOBENATE DIMEGLUMINE 529 MG/ML IV SOLN COMPARISON:  Head CT without contrast 10/04/2005. FINDINGS: No midline shift, mass effect, or evidence of intracranial mass lesion. No abnormal enhancement identified; incidental inferior right temporal lobe developmental venous anomaly (normal variant - series 14, image 7). No dural thickening. Visible bone marrow signal is normal. Negative visualized cervical spine and spinal cord. No restricted diffusion to suggest acute infarction. No ventriculomegaly, extra-axial collection or acute intracranial hemorrhage. Cervicomedullary junction and pituitary are within normal limits. Major intracranial vascular flow voids are preserved, dominant appearing distal right vertebral artery. GPearline Cablesand white matter signal is within normal limits for age throughout the brain. No chronic cerebral blood products.  Visible internal auditory structures appear normal. Visualized paranasal sinuses and mastoids are stable and well pneumatized. Postoperative changes to both globes. Negative orbit and scalp soft tissues. IMPRESSION: Normal for age MRI appearance of the brain. No metastatic disease identified. Electronically Signed   By: HGenevie AnnM.D.   On: 08/25/2015 14:34   Ct Abdomen Pelvis W Contrast  Result Date: 08/27/2015 CLINICAL DATA:  65year old female with a history of inflammatory left breast cancer status post left mastectomy presents with left neck mass. EXAM: CT CHEST, ABDOMEN, AND PELVIS WITH CONTRAST TECHNIQUE: Multidetector CT imaging of the chest, abdomen and pelvis was performed following the standard protocol during bolus administration of intravenous contrast. CONTRAST:  100 cc Isovue-300 IV. COMPARISON:  12/30/2009 chest CT. 02/13/2008 PET-CT. 10/08/2007 CT chest, abdomen and pelvis. FINDINGS: CT CHEST FINDINGS Mediastinum/Nodes: Top-normal heart size. No significant pericardial fluid/thickening. Left anterior descending and right coronary atherosclerosis. Atherosclerotic nonaneurysmal thoracic aorta. Normal caliber pulmonary arteries. No central pulmonary emboli. No discrete thyroid nodules. Unremarkable esophagus. There is enlarged 2.2 cm left level 5 neck lymph node (series 2/image 8), which demonstrates ill-defined margins. There is a mildly enlarged 1.1 cm subcarinal node (series 2/ image 32), new since 2011. No axillary adenopathy. No additional pathologically enlarged mediastinal or hilar  nodes. Lungs/Pleura: No pneumothorax. No pleural effusion. No acute consolidative airspace disease, lung masses or significant pulmonary nodules. There is patchy peribronchovascular and subpleural ground-glass attenuation and subpleural reticulation throughout both lungs without craniocaudal gradient, new since 2011. No significant traction bronchiectasis. No frank honeycombing. 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 Musculoskeletal: No aggressive appearing focal osseous lesions. Mild thoracic spondylosis. Partially visualized right total shoulder arthroplasty. Mildly thick walled subcutaneous fluid collection in the left chest wall at the mastectomy site is not appreciably changed since 2011, most consistent with a postoperative seroma. CT ABDOMEN PELVIS FINDINGS Hepatobiliary: Normal liver size. Suggestion of diffuse hepatic steatosis. No liver mass. Normal gallbladder with no radiopaque cholelithiasis. No biliary ductal dilatation. Pancreas: Normal, with no mass or duct dilation. Spleen: Normal size. No mass. Adrenals/Urinary Tract: Normal adrenals. No renal mass. No hydronephrosis. Parapelvic renal cysts in the left renal sinus. Normal bladder. Stomach/Bowel: Grossly normal stomach. Normal caliber small bowel with no small bowel wall thickening. Normal appendix. Mild sigmoid diverticulosis, with no large bowel wall thickening or pericolonic fat stranding. Vascular/Lymphatic: Atherosclerotic nonaneurysmal abdominal aorta. Patent portal, splenic, hepatic and renal veins. No pathologically enlarged lymph nodes in the abdomen or pelvis. Reproductive: Grossly normal uterus.  No adnexal mass. Other: No pneumoperitoneum, ascites or focal fluid collection. Musculoskeletal: No aggressive appearing focal osseous lesions. Moderate lumbar spondylosis. IMPRESSION: 1. Left level 5 neck lymphadenopathy with infiltrative appearing margins, worrisome for nodal metastasis. Please see the separate dedicated neck CT report for further details. Tissue sampling advised. 2. Mild subcarinal lymphadenopathy, new since 2011, nodal metastasis not excluded. 3. No additional potential sites of metastatic disease in the chest, abdomen or pelvis. 4. Spectrum of findings in the lungs suggestive of an interstitial lung disease such as nonspecific interstitial pneumonia  (NSIP). If clinically warranted, a follow-up high-resolution chest CT study in 6-12 months is recommended to assess for pattern stability. 5. Additional findings include aortic atherosclerosis, 2 vessel coronary atherosclerosis, chronic subcutaneous postoperative seroma at the left mastectomy site, probable diffuse hepatic steatosis and mild sigmoid diverticulosis. Electronically Signed   By: Ilona Sorrel M.D.   On: 08/27/2015 13:20   Nm Pet Image Restag (ps) Skull Base To Thigh  Result Date: 09/08/2015 CLINICAL DATA:  Initial treatment strategy for history of inflammatory type breast cancer with left-sided cervical adenopathy. Breast biopsy 2009. Recent biopsy of left supraclavicular lesion demonstrating metastatic breast cancer. EXAM: NUCLEAR MEDICINE PET SKULL BASE TO THIGH TECHNIQUE: 12.5 mCi F-18 FDG was injected intravenously. Full-ring PET imaging was performed from the skull base to thigh after the radiotracer. CT data was obtained and used for attenuation correction and anatomic localization. FASTING BLOOD GLUCOSE:  Value: 118 mg/dl COMPARISON:  Neck, chest, abdomen, and pelvis CTs of 08/27/2015. FINDINGS: NECK Left-sided level 5 hypermetabolic node is relatively ill-defined and measures 1.6 cm. This hypermetabolic, measuring a S.U.V. max of 10.8 on image 47/series 3. CHEST Hypermetabolic thoracic nodes. Bilateral suprahilar hypermetabolic nodes. An index low subcarinal node measures 11 mm and a S.U.V. max of 14.8 on image 91/series 3. ABDOMEN/PELVIS No soft tissue hypermetabolism within the abdomen or pelvis. SKELETON Anterior inferior L4 hypermetabolic focus measures a S.U.V. max of 10.1. There is a tiny left iliac lesion which measures a S.U.V. max of 5.0 and is relatively CT occult. CT IMAGES PERFORMED FOR ATTENUATION CORRECTION Right shoulder arthroplasty. Chest, abdomen, pelvic findings deferred to recent diagnostic CTs. No acute superimposed process. Lad coronary artery atherosclerosis. Left  chest wall seroma. Mild cardiomegaly. Hepatic steatosis. IMPRESSION: 1. Nodal metastasis within the neck and chest. 2. Osseous metastasis including within the L4 vertebral body. 3. No soft tissue metastasis below the diaphragm. Electronically Signed   By: Abigail Miyamoto M.D.   On: 09/08/2015 15:08   US Biopsy  Result Date: 08/30/2015 INDICATION: 65 year old with history of inflammatory left breast carcinoma. Patient has a new palpable lesion in the left lower neck. Tissue diagnosis is needed. EXAM: ULTRASOUND BIOPSY OF LEFT NECK LESION MEDICATIONS: None. ANESTHESIA/SEDATION: None FLUOROSCOPY TIME:  None COMPLICATIONS: None immediate. PROCEDURE: The procedure was explained to the patient. The risks and benefits of the procedure were discussed and the patient's questions were addressed. Informed consent was obtained from the patient. The palpable area in the left lower neck was evaluated with ultrasound. This area was prepped with chlorhexidine and a sterile field was created. Soft tissues were anesthetized with 1% lidocaine. Using ultrasound guidance, 5 core biopsies were obtained using an 18 gauge core device. Four specimens placed in formalin. One specimen was placed on a Telfa pad. Bandage placed over the puncture site. FINDINGS: Hard palpable lesion in left lower neck. This lesion is very heterogeneous and poorly defined on ultrasound. Unable to identify distinct margins. Core biopsies obtained from the central hypoechoic area. No significant bleeding following the core biopsies. IMPRESSION: Successful ultrasound-guided core biopsies of the heterogeneous left neck mass. Electronically Signed   By: Markus Daft M.D.   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 Now with biopsy proven stage IV disease.  PLAN:    1.  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  Patient completed 5 years of Arimidex in June of 2015. After lengthy discussion with the patient, she wishes to take a more aggressive approach to her malignancy. Will assess if patient qualifies for clinical trial "ACCRU" since she has a life expectancy of greater than 12 weeks. If she does not, will initiate single agent weekly Taxol on days 1, 8, and 15 with day 22 off. Plan to do 3 cycles and then reimage with PET scan. If patient has an excellent response to treatment, she can be placed back on an aromatase inhibitor. Patient will have port placement prior to treatment. Return to clinic on September 27, 2015 for consideration of cycle 1, day 1.  2.  Osteopenia: Patient's bone mineral density from December 24, 2012 reported a T score of -1.2.  She has elected to disconitune Fosamax.  Continue calcium and vitamin D.  Approximately 30 minutes was spent in discussion of which greater than 50% was consultation.  Patient expressed understanding and was in agreement with this plan. She also understands that She can call clinic at any time with any questions, concerns, or complaints.   Breast cancer   Staging form: Breast, AJCC 7th Edition     Pathologic stage from 08/11/2014: Stage IIIA (T0, N2a, cM0) - Signed by Lloyd Huger, MD on 08/11/2014   Lloyd Huger, MD   09/12/2015 3:40 PM

## 2015-09-09 ENCOUNTER — Inpatient Hospital Stay (HOSPITAL_BASED_OUTPATIENT_CLINIC_OR_DEPARTMENT_OTHER): Payer: Managed Care, Other (non HMO) | Admitting: Oncology

## 2015-09-09 VITALS — BP 132/77 | HR 90 | Temp 96.8°F | Resp 18 | Wt 212.0 lb

## 2015-09-09 DIAGNOSIS — F329 Major depressive disorder, single episode, unspecified: Secondary | ICD-10-CM

## 2015-09-09 DIAGNOSIS — M8580 Other specified disorders of bone density and structure, unspecified site: Secondary | ICD-10-CM

## 2015-09-09 DIAGNOSIS — E785 Hyperlipidemia, unspecified: Secondary | ICD-10-CM

## 2015-09-09 DIAGNOSIS — R590 Localized enlarged lymph nodes: Secondary | ICD-10-CM

## 2015-09-09 DIAGNOSIS — Z7982 Long term (current) use of aspirin: Secondary | ICD-10-CM

## 2015-09-09 DIAGNOSIS — M47812 Spondylosis without myelopathy or radiculopathy, cervical region: Secondary | ICD-10-CM

## 2015-09-09 DIAGNOSIS — C7951 Secondary malignant neoplasm of bone: Secondary | ICD-10-CM

## 2015-09-09 DIAGNOSIS — I10 Essential (primary) hypertension: Secondary | ICD-10-CM

## 2015-09-09 DIAGNOSIS — F419 Anxiety disorder, unspecified: Secondary | ICD-10-CM

## 2015-09-09 DIAGNOSIS — Z803 Family history of malignant neoplasm of breast: Secondary | ICD-10-CM

## 2015-09-09 DIAGNOSIS — N281 Cyst of kidney, acquired: Secondary | ICD-10-CM

## 2015-09-09 DIAGNOSIS — Z79899 Other long term (current) drug therapy: Secondary | ICD-10-CM

## 2015-09-09 DIAGNOSIS — K219 Gastro-esophageal reflux disease without esophagitis: Secondary | ICD-10-CM

## 2015-09-09 DIAGNOSIS — Z170 Estrogen receptor positive status [ER+]: Secondary | ICD-10-CM

## 2015-09-09 DIAGNOSIS — I2510 Atherosclerotic heart disease of native coronary artery without angina pectoris: Secondary | ICD-10-CM

## 2015-09-09 DIAGNOSIS — M47814 Spondylosis without myelopathy or radiculopathy, thoracic region: Secondary | ICD-10-CM

## 2015-09-09 DIAGNOSIS — G4700 Insomnia, unspecified: Secondary | ICD-10-CM

## 2015-09-09 DIAGNOSIS — K5730 Diverticulosis of large intestine without perforation or abscess without bleeding: Secondary | ICD-10-CM

## 2015-09-09 DIAGNOSIS — E119 Type 2 diabetes mellitus without complications: Secondary | ICD-10-CM

## 2015-09-09 DIAGNOSIS — Z9012 Acquired absence of left breast and nipple: Secondary | ICD-10-CM

## 2015-09-09 DIAGNOSIS — C779 Secondary and unspecified malignant neoplasm of lymph node, unspecified: Secondary | ICD-10-CM | POA: Diagnosis not present

## 2015-09-09 DIAGNOSIS — M549 Dorsalgia, unspecified: Secondary | ICD-10-CM

## 2015-09-09 DIAGNOSIS — R531 Weakness: Secondary | ICD-10-CM

## 2015-09-09 DIAGNOSIS — M542 Cervicalgia: Secondary | ICD-10-CM

## 2015-09-09 DIAGNOSIS — Z7984 Long term (current) use of oral hypoglycemic drugs: Secondary | ICD-10-CM

## 2015-09-09 DIAGNOSIS — Z807 Family history of other malignant neoplasms of lymphoid, hematopoietic and related tissues: Secondary | ICD-10-CM

## 2015-09-09 DIAGNOSIS — R221 Localized swelling, mass and lump, neck: Secondary | ICD-10-CM

## 2015-09-09 DIAGNOSIS — C50912 Malignant neoplasm of unspecified site of left female breast: Secondary | ICD-10-CM

## 2015-09-09 DIAGNOSIS — R5383 Other fatigue: Secondary | ICD-10-CM

## 2015-09-09 NOTE — Progress Notes (Signed)
States is having pain in left side of neck but otherwise is feeling well.

## 2015-09-13 ENCOUNTER — Telehealth: Payer: Self-pay | Admitting: *Deleted

## 2015-09-13 ENCOUNTER — Telehealth: Payer: Self-pay | Admitting: Surgery

## 2015-09-13 MED ORDER — TRAMADOL HCL 50 MG PO TABS: 50.0000 mg | ORAL_TABLET | Freq: Two times a day (BID) | ORAL | 0 refills | Status: DC

## 2015-09-13 NOTE — Telephone Encounter (Signed)
Spoke with patient at this time. She states that Oncology team would like to start Chemo on 09/27/15. Explained that we will get [place] this week and call her as soon as we have details of this appointment.   Dr. Dahlia Byes notified.   Please place on OR schedule for Port Placement by [place] and call patient with details.

## 2015-09-13 NOTE — Telephone Encounter (Signed)
Per Dr Grayland Ormond Tramadol 50 mg bid # 60 to be faxed . I informed patient and confirmed to send it to [facility] in [place].

## 2015-09-13 NOTE — Telephone Encounter (Signed)
Patient request to speak with Amber. She would like to know if you have heard from Dr Grayland Ormond about her port placement. Also has a question about a lymph node on her neck. Please call at your convenience. Thanks.

## 2015-09-13 NOTE — Progress Notes (Signed)
At Dr. Gary Fleet request, this nurse called the patient to discuss the possibility of participation in the [facility] [id number] research study. The patient expressed interest and agrees to meet with this nurse on Friday at 9:30am to introduce the study and answer any questions she might have at that time.  Mirian Mo, RN, BSN 09/13/2015 12:02 PM

## 2015-09-13 NOTE — Telephone Encounter (Signed)
Lymph node in neck is very painful and is asking for pain med to help with the pain and to be able to sleep at night due to the the pain keeping her up

## 2015-09-14 NOTE — Telephone Encounter (Signed)
Patient called confused as to why her appointment was canceled on 09/17/2015. I told the patient that it might be due to her getting a port placement this week. Please call the Sherlynn back to further explain in detail. Thank you

## 2015-09-14 NOTE — Telephone Encounter (Signed)
Explained to patient that her port will be placed tomorrow. Please call her with details of surgery and Pre-op.

## 2015-09-14 NOTE — Telephone Encounter (Signed)
Pt advised of pre op date/time and sx date. Sx: 09/17/15 with Dr Jeremy Johann Placement.  Pre op: 09/15/15 between 9-1:00pm--Phone.   Patient made aware to call [phone], between 1-3:00pm the day before surgery, to find out what time to arrive.

## 2015-09-15 ENCOUNTER — Ambulatory Visit: Payer: Self-pay | Admitting: Surgery

## 2015-09-15 ENCOUNTER — Telehealth: Payer: Self-pay | Admitting: *Deleted

## 2015-09-15 ENCOUNTER — Encounter
Admission: RE | Admit: 2015-09-15 | Discharge: 2015-09-15 | Disposition: A | Discharge status: Home / Self Care | Payer: Managed Care, Other (non HMO) | Source: Ambulatory Visit | Attending: Surgery | Admitting: Surgery

## 2015-09-15 MED ORDER — DEXTROSE 5 % IV SOLN: 900.0000 mg | INTRAVENOUS | Status: AC

## 2015-09-15 NOTE — Telephone Encounter (Signed)
Called to request that Dr Grayland Ormond call her in the morning before he starts clinic to discuss research study for her cancer treatment

## 2015-09-15 NOTE — Patient Instructions (Signed)
  Your procedure is scheduled on: 09-17-15 Report to Same Day Surgery 2nd floor medical mall To find out your arrival time please call [phone] between 1PM - 3PM on 09-16-15  Remember: Instructions that are not followed completely may result in serious medical risk, up to and including death, or upon the discretion of your surgeon and anesthesiologist your surgery may need to be rescheduled.    _x___ 1. Do not eat food or drink liquids after midnight. No gum chewing or hard candies.     __x__ 2. No Alcohol for 24 hours before or after surgery.   __x__3. No Smoking for 24 prior to surgery.   ____  4. Bring all medications with you on the day of surgery if instructed.    __x__ 5. Notify your doctor if there is any change in your medical condition     (cold, fever, infections).     Do not wear jewelry, make-up, hairpins, clips or nail polish.  Do not wear lotions, powders, or perfumes. You may wear deodorant.  Do not shave 48 hours prior to surgery. Men may shave face and neck.  Do not bring valuables to the hospital.    [facility] is not responsible for any belongings or valuables.               Contacts, dentures or bridgework may not be worn into surgery.  Leave your suitcase in the car. After surgery it may be brought to your room.  For patients admitted to the hospital, discharge time is determined by your treatment team.   Patients discharged the day of surgery will not be allowed to drive home.    Please read over the following fact sheets that you were given:   [facility] Preparing for Surgery and or MRSA Information   _x___ Take these medicines the morning of surgery with A SIP OF WATER:    1. PAXIL  2.  3.  4.  5.  6.  ____ Fleet Enema (as directed)   ____ Use CHG Soap or sage wipes as directed on instruction sheet   ____ Use inhalers on the day of surgery and bring to hospital day of surgery  _X___ Stop metformin 2 days prior to surgery NOW-LAST DOSE THIS  AM (09-15-15)    ____ Take 1/2 of usual insulin dose the night before surgery and none on the morning of surgery.   _X___ Stop aspirin or coumadin, or plavix-STOP ASA NOW  _x__ Stop Anti-inflammatories such as Advil, Aleve, Ibuprofen, Motrin, Naproxen,          Naprosyn, Goodies powders or aspirin products. Ok to take Tylenol OR TRAMADOL   ____ Stop supplements until after surgery.    ____ Bring C-Pap to the hospital.

## 2015-09-15 NOTE — Addendum Note (Signed)
Addended by: Caroleen Hamman F on: 09/15/2015 10:47 AM   Modules accepted: Orders, SmartSet

## 2015-09-17 ENCOUNTER — Encounter: Payer: Self-pay | Admitting: *Deleted

## 2015-09-17 ENCOUNTER — Ambulatory Visit: Payer: Managed Care, Other (non HMO) | Admitting: Anesthesiology

## 2015-09-17 ENCOUNTER — Encounter
Admission: RE | Disposition: A | Discharge status: Home / Self Care | Payer: Self-pay | Source: Ambulatory Visit | Attending: Surgery

## 2015-09-17 ENCOUNTER — Ambulatory Visit: Payer: Self-pay | Admitting: Surgery

## 2015-09-17 ENCOUNTER — Ambulatory Visit
Admission: RE | Admit: 2015-09-17 | Discharge: 2015-09-17 | Disposition: A | Discharge status: Home / Self Care | Payer: Managed Care, Other (non HMO) | Source: Ambulatory Visit | Attending: Surgery | Admitting: Surgery

## 2015-09-17 ENCOUNTER — Ambulatory Visit: Payer: Managed Care, Other (non HMO)

## 2015-09-17 DIAGNOSIS — Z9221 Personal history of antineoplastic chemotherapy: Secondary | ICD-10-CM | POA: Diagnosis not present

## 2015-09-17 DIAGNOSIS — Z7982 Long term (current) use of aspirin: Secondary | ICD-10-CM | POA: Insufficient documentation

## 2015-09-17 DIAGNOSIS — Z923 Personal history of irradiation: Secondary | ICD-10-CM | POA: Insufficient documentation

## 2015-09-17 DIAGNOSIS — C50912 Malignant neoplasm of unspecified site of left female breast: Secondary | ICD-10-CM

## 2015-09-17 DIAGNOSIS — Z9012 Acquired absence of left breast and nipple: Secondary | ICD-10-CM | POA: Diagnosis not present

## 2015-09-17 DIAGNOSIS — E119 Type 2 diabetes mellitus without complications: Secondary | ICD-10-CM | POA: Insufficient documentation

## 2015-09-17 DIAGNOSIS — Z853 Personal history of malignant neoplasm of breast: Secondary | ICD-10-CM | POA: Diagnosis not present

## 2015-09-17 DIAGNOSIS — M1990 Unspecified osteoarthritis, unspecified site: Secondary | ICD-10-CM | POA: Diagnosis not present

## 2015-09-17 DIAGNOSIS — F329 Major depressive disorder, single episode, unspecified: Secondary | ICD-10-CM | POA: Diagnosis not present

## 2015-09-17 DIAGNOSIS — Z7984 Long term (current) use of oral hypoglycemic drugs: Secondary | ICD-10-CM | POA: Insufficient documentation

## 2015-09-17 DIAGNOSIS — Z79899 Other long term (current) drug therapy: Secondary | ICD-10-CM | POA: Diagnosis not present

## 2015-09-17 DIAGNOSIS — K219 Gastro-esophageal reflux disease without esophagitis: Secondary | ICD-10-CM | POA: Insufficient documentation

## 2015-09-17 DIAGNOSIS — E785 Hyperlipidemia, unspecified: Secondary | ICD-10-CM | POA: Insufficient documentation

## 2015-09-17 DIAGNOSIS — Z96611 Presence of right artificial shoulder joint: Secondary | ICD-10-CM | POA: Insufficient documentation

## 2015-09-17 DIAGNOSIS — C7989 Secondary malignant neoplasm of other specified sites: Secondary | ICD-10-CM | POA: Insufficient documentation

## 2015-09-17 DIAGNOSIS — I10 Essential (primary) hypertension: Secondary | ICD-10-CM | POA: Insufficient documentation

## 2015-09-17 HISTORY — PX: PORTACATH PLACEMENT: SHX2246

## 2015-09-17 LAB — GLUCOSE, CAPILLARY: Glucose-Capillary: 121 mg/dL — ABNORMAL HIGH (ref 65–99)

## 2015-09-17 SURGERY — INSERTION, TUNNELED CENTRAL VENOUS DEVICE, WITH PORT
Anesthesia: General | Wound class: Clean

## 2015-09-17 MED ORDER — BUPIVACAINE-EPINEPHRINE (PF) 0.25% -1:200000 IJ SOLN
INTRAMUSCULAR | Status: DC | PRN
  Administered 2015-09-17: 5 mL via PERINEURAL

## 2015-09-17 MED ORDER — MEPERIDINE HCL 25 MG/ML IJ SOLN: 6.2500 mg | INTRAMUSCULAR | Status: DC | PRN

## 2015-09-17 MED ORDER — FENTANYL CITRATE (PF) 100 MCG/2ML IJ SOLN: 25.0000 ug | INTRAMUSCULAR | Status: DC | PRN

## 2015-09-17 MED ORDER — BUPIVACAINE-EPINEPHRINE (PF) 0.25% -1:200000 IJ SOLN
INTRAMUSCULAR | Status: AC
  Filled 2015-09-17: qty 30

## 2015-09-17 MED ORDER — SODIUM CHLORIDE 0.9 % IV SOLN
INTRAVENOUS | Status: DC
  Administered 2015-09-17: 12:00:00 via INTRAVENOUS

## 2015-09-17 MED ORDER — DEXAMETHASONE SODIUM PHOSPHATE 10 MG/ML IJ SOLN
INTRAMUSCULAR | Status: DC | PRN
  Administered 2015-09-17: 5 mg via INTRAVENOUS

## 2015-09-17 MED ORDER — ONDANSETRON HCL 4 MG/2ML IJ SOLN
INTRAMUSCULAR | Status: DC | PRN
  Administered 2015-09-17: 4 mg via INTRAVENOUS

## 2015-09-17 MED ORDER — HEPARIN SODIUM (PORCINE) 5000 UNIT/ML IJ SOLN
INTRAMUSCULAR | Status: AC
  Filled 2015-09-17: qty 1

## 2015-09-17 MED ORDER — OXYCODONE HCL 5 MG/5ML PO SOLN
5.0000 mg | Freq: Once | ORAL | Status: DC | PRN
Start: 2015-09-17 — End: 2015-09-17

## 2015-09-17 MED ORDER — PHENYLEPHRINE HCL 10 MG/ML IJ SOLN
INTRAMUSCULAR | Status: DC | PRN
Start: 2015-09-17 — End: 2015-09-17
  Administered 2015-09-17 (×3): 100 ug via INTRAVENOUS

## 2015-09-17 MED ORDER — MIDAZOLAM HCL 2 MG/2ML IJ SOLN
INTRAMUSCULAR | Status: DC | PRN
  Administered 2015-09-17: 2 mg via INTRAVENOUS

## 2015-09-17 MED ORDER — CLINDAMYCIN PHOSPHATE 600 MG/50ML IV SOLN
INTRAVENOUS | Status: DC | PRN
  Administered 2015-09-17: 600 mg via INTRAVENOUS

## 2015-09-17 MED ORDER — PROPOFOL 500 MG/50ML IV EMUL
INTRAVENOUS | Status: DC | PRN
  Administered 2015-09-17: 150 ug/kg/min via INTRAVENOUS

## 2015-09-17 MED ORDER — LIDOCAINE HCL (PF) 1 % IJ SOLN
INTRAMUSCULAR | Status: AC
  Filled 2015-09-17: qty 30

## 2015-09-17 MED ORDER — FENTANYL CITRATE (PF) 100 MCG/2ML IJ SOLN
INTRAMUSCULAR | Status: DC | PRN
  Administered 2015-09-17: 25 ug via INTRAVENOUS

## 2015-09-17 MED ORDER — CHLORHEXIDINE GLUCONATE CLOTH 2 % EX PADS: 6.0000 | MEDICATED_PAD | Freq: Once | CUTANEOUS | Status: DC

## 2015-09-17 MED ORDER — LIDOCAINE HCL (CARDIAC) 20 MG/ML IV SOLN
INTRAVENOUS | Status: DC | PRN
  Administered 2015-09-17: 100 mg via INTRAVENOUS

## 2015-09-17 MED ORDER — PROMETHAZINE HCL 25 MG/ML IJ SOLN
6.2500 mg | INTRAMUSCULAR | Status: DC | PRN
Start: 2015-09-17 — End: 2015-09-17

## 2015-09-17 MED ORDER — SODIUM CHLORIDE 0.9 % IV SOLN
INTRAVENOUS | Status: DC | PRN
  Administered 2015-09-17: 1 mL via INTRAMUSCULAR

## 2015-09-17 MED ORDER — CLINDAMYCIN PHOSPHATE 600 MG/50ML IV SOLN
INTRAVENOUS | Status: AC
  Filled 2015-09-17: qty 50

## 2015-09-17 MED ORDER — FAMOTIDINE 20 MG PO TABS
ORAL_TABLET | ORAL | Status: AC
Start: 2015-09-17 — End: 2015-09-17
  Administered 2015-09-17: 20 mg via ORAL
  Filled 2015-09-17: qty 1

## 2015-09-17 MED ORDER — OXYCODONE HCL 5 MG PO TABS: 5.0000 mg | ORAL_TABLET | Freq: Once | ORAL | Status: DC | PRN

## 2015-09-17 MED ORDER — CHLORHEXIDINE GLUCONATE CLOTH 2 % EX PADS
6.0000 | MEDICATED_PAD | Freq: Once | CUTANEOUS | Status: AC
  Administered 2015-09-17: 6 via TOPICAL

## 2015-09-17 MED ORDER — FAMOTIDINE 20 MG PO TABS
20.0000 mg | ORAL_TABLET | Freq: Once | ORAL | Status: AC
  Administered 2015-09-17: 20 mg via ORAL

## 2015-09-17 MED ORDER — LIDOCAINE HCL (PF) 1 % IJ SOLN
INTRAMUSCULAR | Status: DC | PRN
Start: 2015-09-17 — End: 2015-09-17
  Administered 2015-09-17: 5 mL

## 2015-09-17 MED ORDER — HEPARIN SODIUM (PORCINE) 5000 UNIT/ML IJ SOLN
INTRAMUSCULAR | Status: AC
Start: 2015-09-17 — End: 2015-09-17
  Filled 2015-09-17: qty 1

## 2015-09-17 SURGICAL SUPPLY — 31 items
BAG DECANTER FOR FLEXI CONT (MISCELLANEOUS) ×3 IMPLANT
BLADE SURG SZ11 CARB STEEL (BLADE) ×3 IMPLANT
BOOT SUTURE AID YELLOW STND (SUTURE) ×3 IMPLANT
CANISTER SUCT 1200ML W/VALVE (MISCELLANEOUS) ×3 IMPLANT
CHLORAPREP W/TINT 26ML (MISCELLANEOUS) ×3 IMPLANT
COVER LIGHT HANDLE STERIS (MISCELLANEOUS) ×6 IMPLANT
DRAPE C-ARM XRAY 36X54 (DRAPES) ×3 IMPLANT
DRAPE INCISE IOBAN 66X45 STRL (DRAPES) ×3 IMPLANT
ELECT REM PT RETURN 9FT ADLT (ELECTROSURGICAL) ×3
ELECTRODE REM PT RTRN 9FT ADLT (ELECTROSURGICAL) ×1 IMPLANT
GEL ULTRASOUND 20GR AQUASONIC (MISCELLANEOUS) ×3 IMPLANT
GLOVE BIO SURGEON STRL SZ7 (GLOVE) ×6 IMPLANT
GOWN STRL REUS W/ TWL LRG LVL3 (GOWN DISPOSABLE) ×2 IMPLANT
GOWN STRL REUS W/TWL LRG LVL3 (GOWN DISPOSABLE) ×4
IV NS 500ML (IV SOLUTION) ×2
IV NS 500ML BAXH (IV SOLUTION) ×1 IMPLANT
KIT PORT POWER 8FR ISP CVUE (Catheter) ×3 IMPLANT
LIQUID BAND (GAUZE/BANDAGES/DRESSINGS) ×3 IMPLANT
NEEDLE HYPO 25X1 1.5 SAFETY (NEEDLE) ×3 IMPLANT
NS IRRIG 1000ML POUR BTL (IV SOLUTION) ×3 IMPLANT
PACK PORT-A-CATH (MISCELLANEOUS) ×3 IMPLANT
SPONGE LAP 18X18 5 PK (GAUZE/BANDAGES/DRESSINGS) ×3 IMPLANT
SUT MNCRL AB 4-0 PS2 18 (SUTURE) ×3 IMPLANT
SUT PROLENE 2-0 (SUTURE) ×2
SUT PROLENE 2-0 RB1 36X2 ARM (SUTURE) ×1
SUT VIC AB 3-0 SH 27 (SUTURE) ×2
SUT VIC AB 3-0 SH 27X BRD (SUTURE) ×1 IMPLANT
SUTURE PROLEN 2-0 RB1 36X2 ARM (SUTURE) ×1 IMPLANT
SYR 20CC LL (SYRINGE) ×3 IMPLANT
SYR 5ML LL (SYRINGE) ×3 IMPLANT
TOWEL OR 17X26 4PK STRL BLUE (TOWEL DISPOSABLE) ×3 IMPLANT

## 2015-09-17 NOTE — Telephone Encounter (Signed)
Ok, that's what I called.  She getting her port today anyway.  Will try again on Tuesday. Thanks.

## 2015-09-17 NOTE — Progress Notes (Signed)
Fingerstick 137

## 2015-09-17 NOTE — Interval H&P Note (Signed)
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 After consideration of risks, benefits and other options for treatment, the patient has consented to  Procedure(s): INSERTION PORT-A-CATH (N/A) as a surgical intervention .  The patient's history has been reviewed, patient examined, no change in status, stable for surgery.  I have reviewed the patient's chart and labs.  Questions were answered to the patient's satisfaction.     [place]

## 2015-09-17 NOTE — Discharge Instructions (Addendum)
No repetitive activities with your right arm x 4 days.  Ice for comfort.  AMBULATORY SURGERY  DISCHARGE INSTRUCTIONS   1) The drugs that you were given will stay in your system until tomorrow so for the next 24 hours you should not:  A) Drive an automobile B) Make any legal decisions C) Drink any alcoholic beverage   2) You may resume regular meals tomorrow.  Today it is better to start with liquids and gradually work up to solid foods.  You may eat anything you prefer, but it is better to start with liquids, then soup and crackers, and gradually work up to solid foods.   3) Please notify your doctor immediately if you have any unusual bleeding, trouble breathing, redness and pain at the surgery site, drainage, fever, or pain not relieved by medication.   4) Additional Instructions:   Please contact your physician with any problems or Same Day Surgery at [phone], Monday through Friday 6 am to 4 pm, or [place] at [facility] number at [phone].

## 2015-09-17 NOTE — Anesthesia Procedure Notes (Signed)
Date/Time: 09/17/2015 12:35 PM Performed by: Doreen Salvage Pre-anesthesia Checklist: Patient identified, Emergency Drugs available, Suction available and Patient being monitored Patient Re-evaluated:Patient Re-evaluated prior to inductionOxygen Delivery Method: Nasal cannula

## 2015-09-17 NOTE — Telephone Encounter (Signed)
She left [phone]

## 2015-09-17 NOTE — Op Note (Signed)
  Pre-operative Diagnosis: Metastatic Breast CA  Post-operative Diagnosis: Same   Surgeon: Caroleen Hamman, MD FACS  Anesthesia: IV sedation, marcaine .25% w epi and lidocaine 1%  Procedure: right IJ  Port placement with fluoroscopy under U/S guidance  Findings: Good position of the tip of the catheter by fluoroscopy Patent Right IJ  Estimated Blood Loss: Minimal         Drains: None         Specimens: None       Complications: none            Procedure Details  The patient was seen again in the Holding Room. The benefits, complications, treatment options, and expected outcomes were discussed with the patient. The risks of bleeding, infection, recurrence of symptoms, failure to resolve symptoms,  thrombosis nonfunction breakage pneumothorax hemopneumothorax any of which could require chest tube or further surgery were reviewed with the patient.   The patient was taken to Operating Room, identified as Amanda [name] and the procedure verified.  A Time Out was held and the above information confirmed.  Prior to the induction of general anesthesia, antibiotic prophylaxis was administered. VTE prophylaxis was in place. Appropriate anesthesia was then administered and tolerated well. The chest was prepped with Chloraprep and draped in the sterile fashion. The patient was positioned in the supine position. Then the patient was placed in Trendelenburg position.  Patient was prepped and draped in sterile fashion and in a Trendelenburg position local anesthetic was infiltrated into the skin and subcutaneous tissues in the neck and anterior chest wall. The large bore needle was placed into the internal jugular vein under U/S guidance without difficulty and then the Seldinger wire was advanced. Fluoroscopy was utilized to confirm that the Seldinger wire was in the superior vena cava.  An incision was made and a port pocket developed with blunt and electrocautery dissection. The introducer dilator  was placed over the Seldinger wire the wire was removed. The previously flushed catheter was placed into the introducer dilator and the peel-away sheath was removed. The catheter length was confirmed and trimmed utilizing fluoroscopy for proper positioning. The catheter was then attached to the previously flushed port. The port was placed into the pocket. The port was held in with 2-0 Prolenes and flushed for function and heparin locked.  The wound was closed with interrupted 3-0 Vicryl followed by 4-0 subcuticular Monocryl sutures. Dermabond used to coat the skin  Patient was taken to the recovery room in stable condition where a postoperative chest film has been ordered.

## 2015-09-17 NOTE — H&P (View-Only) (Signed)
Patient ID: Amanda [name], female   DOB: Sep 03, 1950, 65 y.o.   MRN: [id number]  HPI Amanda [name] is a 65 y.o. female for by Dr. Tamala Julian and in seen in consultation for a left neck mass. Therefore she notice and left neck nodule about 10 days ago after an IV was placed on her neck. No pain is in the left side. She does notice that on her left breast were the mastectomy incision she has experienced pain that is sharp, intermittent, non radiated and moderate in intensity for the last few months. He is recently seen by Dr. Grayland Ormond about a month ago and a recent mammogram that has been personally reviewed showing no evidence of CA in the right side. 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 This was done in 2010. He is also an awaiting an evaluation for the plastic surgeon for her chronic pain and chest wall deformity after the mastectomy. She denies any weight loss, any fevers and chills or any other symptoms  HPI  Past Medical History:  Diagnosis Date  . Back pain    occasionally  . Breast cancer ([place])   . Depression    takes Paxil and Wellbutrin daily  . Diabetes ([place])    takes Metformin daily  . GERD (gastroesophageal reflux disease)   . History of bronchitis 2 yrs ago  . Hyperlipidemia    takes Atorvastatin daily  . Hypertension    takes Losartan-HCTZ daily  . Insomnia    takes Ambien nightly  . Insomnia    takes gabapentin nightly  . Joint pain   . Migraine   . Mood swings ([place])   . Osteoarthritis of knee   . PONV (postoperative nausea and vomiting)   . Seasonal allergies    takes Allegra daily    Past Surgical History:  Procedure Laterality Date  . BREAST BIOPSY  2009  . cataract surgery Bilateral   . COLONOSCOPY    . JOINT REPLACEMENT Left 2014   knee  . KNEE ARTHROSCOPY Left    x 5  . MASTECTOMY Left   . port a cath placed    . TOTAL SHOULDER  ARTHROPLASTY Right 06/03/2015   Procedure: TOTAL SHOULDER ARTHROPLASTY;  Surgeon: Tania Ade, MD;  Location: [place];  Service: Orthopedics;  Laterality: Right;  Right total shoulder arthroplasty  . TOTAL SHOULDER REPLACEMENT Right 06/03/2015  . TUBAL LIGATION      History reviewed. No pertinent family history.  Social History Social History  Substance Use Topics  . Smoking status: Never Smoker  . Smokeless tobacco: Never Used  . Alcohol use 0.0 oz/week     Comment: occasionally wine    Allergies  Allergen Reactions  . Ace Inhibitors Cough  . Latex Itching  . Morphine And Related Itching    Caused her to itch terribly. Would prefer if given to take with a benadryl  . Other Itching    Freeze spray. Patient stated that she may be able to use it now because she doesn't use it as often.  Marland Kitchen Penicillins Rash    Has patient had a PCN reaction causing immediate rash, facial/tongue/throat swelling, SOB or lightheadedness with hypotension: No Has patient had a PCN reaction causing severe rash involving mucus membranes or skin necrosis: No Has patient had a PCN reaction that required hospitalization No Has patient had a PCN reaction occurring within the last 10  years: No If all of the above answers are "NO", then may proceed with Cephalosporin use.    Current Outpatient Prescriptions  Medication Sig Dispense Refill  . aspirin EC 81 MG tablet Take 81 mg by mouth daily.     Marland Kitchen atorvastatin (LIPITOR) 10 MG tablet Take 10 mg by mouth daily.     Marland Kitchen buPROPion (WELLBUTRIN XL) 150 MG 24 hr tablet Take 150 mg by mouth daily.     . Calcium Carbonate-Vit D-Min (CALCIUM 600+D PLUS MINERALS) 600-400 MG-UNIT TABS Take by mouth.    . clindamycin (CLEOCIN) 300 MG capsule Take 1 capsule by mouth 3 (three) times daily.    . fexofenadine (ALLEGRA) 180 MG tablet Take 180 mg by mouth daily.     Marland Kitchen gabapentin (NEURONTIN) 300 MG capsule Take 300 mg by mouth at bedtime.  1  . HYDROmorphone (DILAUDID) 2 MG  tablet Take 0.5-1 tablets (1-2 mg total) by mouth every 4 (four) hours as needed for severe pain. 60 tablet 0  . losartan-hydrochlorothiazide (HYZAAR) 100-25 MG per tablet Take 1 tablet by mouth daily.     . metFORMIN (GLUCOPHAGE) 850 MG tablet Take 850 mg by mouth 2 (two) times daily with a meal.     . PARoxetine (PAXIL) 40 MG tablet Take 40 mg by mouth daily.      No current facility-administered medications for this visit.      Review of Systems A 10 point review of systems was asked and was negative except for the information on the HPI  Physical Exam Blood pressure 137/72, pulse 93, temperature 97.4 F (36.3 C), temperature source Oral, height 5\' 5"  (1.651 m), weight 94.8 kg (209 lb). CONSTITUTIONAL: NAD obese EYES: Pupils are equal, round, and reactive to light, Sclera are non-icteric. EARS, NOSE, MOUTH AND THROAT: The oropharynx is clear. The oral mucosa is pink and moist. Hearing is intact to voice. LYMPH NODES:  Left supraclavicular nodes, C a cluster of 3 nodes hard and there are attached to deep tissue. No axillary lymphadenopathy RESPIRATORY:  Lungs are clear. There is normal respiratory effort, with equal breath sounds bilaterally, and without pathologic use of accessory muscles. Breast: Left chest wall with a previous mastectomy deformity and with some significant redundant superior and inferior flaps. There is no obvious evidence of masses. On the right breast there is no nipple or skin lesions. And there are no breast masses CARDIOVASCULAR: Heart is regular without murmurs, gallops, or rubs. GI: The abdomen is  soft, nontender, and nondistended. There are no palpable masses. There is no hepatosplenomegaly. There are normal bowel sounds in all quadrants. GU: Rectal deferred.   MUSCULOSKELETAL: Normal muscle strength and tone. No cyanosis or edema.   SKIN: Turgor is good and there are no pathologic skin lesions or ulcers. NEUROLOGIC: Motor and sensation is grossly normal.  Cranial nerves are grossly intact. PSYCH:  Oriented to person, place and time. Affect is normal.  Data Reviewed  I have personally reviewed the patient's imaging, laboratory findings and medical records.    Assessment Plan 65 year old female with a history of inflammatory breast cancer in 2010 now comes with a new left neck mass concerning for metastatic disease. First order of business is to obtain tissue diagnosis of the left neck mass. We'll arrange for ultrasound-guided core needle biopsy. We will also arrange for a PET CT as well as an MRI. I do have highly suspicion that this is metastatic disease to the left neck as far as her left chest wall/breast  pain I do think this is a recurrence from from inflammatory breast cancer. Discussed with the patient in detail and extensive counseling provided. We'll see her back in a few weeks once we have a tissue diagnosis. She will likely benefit also from referral to oncology and tumor board meeting. No need for surgical intervention at this time. Caroleen Hamman, MD FACS General Surgeon 08/24/2015, 2:45 PM

## 2015-09-17 NOTE — Anesthesia Postprocedure Evaluation (Signed)
Anesthesia Post Note  Patient: Amanda [name]  Procedure(s) Performed: Procedure(s) (LRB): INSERTION PORT-A-CATH (N/A)  Patient location during evaluation: PACU Anesthesia Type: General Level of consciousness: awake and alert Pain management: pain level controlled Vital Signs Assessment: post-procedure vital signs reviewed and stable Respiratory status: spontaneous breathing, nonlabored ventilation and respiratory function stable Cardiovascular status: blood pressure returned to baseline and stable Postop Assessment: no signs of nausea or vomiting Anesthetic complications: no    Last Vitals:  Vitals:   09/17/15 1104 09/17/15 1344  BP: 124/81 114/75  Pulse: 81 82  Resp: 16 18  Temp: 37.1 C (!) 36 C    Last Pain:  Vitals:   09/17/15 1344  TempSrc: Temporal  PainSc:                  [name]

## 2015-09-17 NOTE — Transfer of Care (Signed)
Immediate Anesthesia Transfer of Care Note  Patient: Amanda [name]  Procedure(s) Performed: Procedure(s): INSERTION PORT-A-CATH (N/A)  Patient Location: PACU  Anesthesia Type:MAC  Level of Consciousness: awake, alert  and oriented  Airway & Oxygen Therapy: Patient Spontanous Breathing  Post-op Assessment: Post -op Vital signs reviewed and stable  Post vital signs: stable  Last Vitals:  Vitals:   09/17/15 1104 09/17/15 1344  BP: 124/81 114/75  Pulse: 81 82  Resp: 16 18  Temp: 37.1 C (!) 36 C    Last Pain:  Vitals:   09/17/15 1344  TempSrc: Temporal  PainSc:          Complications: No apparent anesthesia complications

## 2015-09-17 NOTE — Anesthesia Preprocedure Evaluation (Signed)
Anesthesia Evaluation  Patient identified by MRN, date of birth, ID band Patient awake    Reviewed: Allergy & Precautions, NPO status , Patient's Chart, lab work & pertinent test results  History of Anesthesia Complications (+) PONV and history of anesthetic complications  Airway Mallampati: II  TM Distance: >3 FB Neck ROM: Full    Dental  (+) Poor Dentition   Pulmonary neg pulmonary ROS, neg sleep apnea, neg COPD,    breath sounds clear to auscultation- rhonchi (-) wheezing      Cardiovascular hypertension, Pt. on medications (-) CAD and (-) Past MI  Rhythm:Regular Rate:Normal - Systolic murmurs and - Diastolic murmurs    Neuro/Psych  Headaches, Depression    GI/Hepatic Neg liver ROS, GERD  ,  Endo/Other  diabetes, Type 2, Oral Hypoglycemic Agents  Renal/GU negative Renal ROS     Musculoskeletal  (+) Arthritis , Osteoarthritis,    Abdominal (+) + obese,   Peds  Hematology negative hematology ROS (+)   Anesthesia Other Findings Past Medical History: No date: Back pain     Comment: occasionally No date: Breast cancer ([facility]) No date: Depression     Comment: takes Paxil and Wellbutrin daily No date: Diabetes ([facility])     Comment: takes Metformin daily No date: GERD (gastroesophageal reflux disease) 2 yrs ago: History of bronchitis No date: Hyperlipidemia     Comment: takes Atorvastatin daily No date: Hypertension     Comment: takes Losartan-HCTZ daily No date: Insomnia     Comment: takes Ambien nightly No date: Insomnia     Comment: takes gabapentin nightly No date: Joint pain No date: Migraine No date: Mood swings ([facility]) No date: Osteoarthritis of knee No date: PONV (postoperative nausea and vomiting) No date: Seasonal allergies     Comment: takes Allegra daily   Reproductive/Obstetrics                             Anesthesia Physical Anesthesia Plan  ASA: III  Anesthesia  Plan: General   Post-op Pain Management:    Induction: Intravenous  Airway Management Planned: Natural Airway  Additional Equipment:   Intra-op Plan:   Post-operative Plan:   Informed Consent: I have reviewed the patients History and Physical, chart, labs and discussed the procedure including the risks, benefits and alternatives for the proposed anesthesia with the patient or authorized representative who has indicated his/her understanding and acceptance.   Dental advisory given  Plan Discussed with: CRNA and Anesthesiologist  Anesthesia Plan Comments:         Anesthesia Quick Evaluation

## 2015-09-17 NOTE — Telephone Encounter (Signed)
Called her back, but had a wrong number. Can you please confirm a phone to call?  Thanks!

## 2015-09-20 ENCOUNTER — Encounter: Payer: Self-pay | Admitting: Surgery

## 2015-09-20 LAB — GLUCOSE, CAPILLARY: GLUCOSE-CAPILLARY: 137 mg/dL — AB (ref 65–99)

## 2015-09-21 ENCOUNTER — Telehealth: Payer: Self-pay | Admitting: *Deleted

## 2015-09-21 NOTE — Telephone Encounter (Signed)
Patient called in to request call back regarding questions about clinical trial.

## 2015-09-22 ENCOUNTER — Other Ambulatory Visit: Payer: Self-pay | Admitting: *Deleted

## 2015-09-22 DIAGNOSIS — C50912 Malignant neoplasm of unspecified site of left female breast: Secondary | ICD-10-CM

## 2015-09-22 NOTE — Progress Notes (Signed)
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  INTERVAL HISTORY: Patient returns to clinic today for further evaluation and discussion of enrolling in a clinical trial. She currently feels well and is asymptomatic. She has no neurologic complaints. She denies any recent fevers or illnesses.  She denies any chest pain or shortness of breath.  She denies any nausea, vomiting, constipation, or diarrhea.  She has no urinary complaints.  Patient offers no specific complaints today.  REVIEW OF SYSTEMS:   Review of Systems  Constitutional: Negative.  Negative for fever, malaise/fatigue and weight loss.  Respiratory: Negative.  Negative for cough and shortness of breath.   Cardiovascular: Negative.  Negative for chest pain.  Gastrointestinal: Negative.  Negative for abdominal pain.  Genitourinary: Negative.   Musculoskeletal: Positive for neck pain.  Neurological: Negative.  Negative for sensory change and weakness.  Endo/Heme/Allergies: Negative.   Psychiatric/Behavioral: The patient is nervous/anxious.     As per HPI. Otherwise, a complete review of systems is negative.  PAST MEDICAL HISTORY: Past Medical History:  Diagnosis Date  . Back pain    occasionally  . Breast cancer ([place])   . Depression    takes Paxil and Wellbutrin daily  . Diabetes ([place])    takes Metformin daily  . GERD (gastroesophageal reflux disease)   . History of bronchitis 2 yrs ago  . Hyperlipidemia    takes Atorvastatin daily  . Hypertension    takes Losartan-HCTZ daily  . Insomnia    takes Ambien nightly  . Insomnia    takes gabapentin nightly  . Joint pain   . Migraine   . Mood swings ([place])   . Osteoarthritis of knee   . PONV (postoperative nausea and vomiting)   . Seasonal allergies    takes Allegra daily    PAST SURGICAL HISTORY: Past Surgical History:  Procedure Laterality Date  . BREAST BIOPSY  2009  . cataract surgery Bilateral   . COLONOSCOPY    . JOINT REPLACEMENT Left 2014   knee  . KNEE ARTHROSCOPY Left    x 5  . MASTECTOMY Left   . port a cath placed    . PORTACATH PLACEMENT N/A 09/17/2015   Procedure: INSERTION PORT-A-CATH;  Surgeon: Jules Husbands, MD;  Location: [facility];  Service: General;  Laterality: N/A;  . TOTAL SHOULDER ARTHROPLASTY Right 06/03/2015   Procedure: TOTAL SHOULDER ARTHROPLASTY;  Surgeon: Tania Ade, MD;  Location: [facility];  Service: Orthopedics;  Laterality: Right;  Right total shoulder arthroplasty  . TOTAL SHOULDER REPLACEMENT Right 06/03/2015  . TUBAL LIGATION      FAMILY HISTORY: Father with non-Hodgkin's lymphoma, 2 paternal aunts with breast cancer.     ADVANCED DIRECTIVES:    HEALTH MAINTENANCE: Social History  Substance Use Topics  . Smoking status: Never Smoker  . Smokeless tobacco: Never Used  . Alcohol use 0.0 oz/week     Comment: occasionally wine     Colonoscopy:  PAP:  Bone density:  Lipid panel:  Allergies  Allergen Reactions  . Ace Inhibitors Cough  . Latex Itching  . Morphine And Related Itching    Caused her to itch terribly. Would prefer if given  to take with a benadryl  . Other Itching    Freeze spray. Patient stated that she may be able to use it now because she doesn't use it as often.  Marland Kitchen Penicillins Rash    Has patient had a PCN reaction causing immediate rash, facial/tongue/throat swelling, SOB or lightheadedness with hypotension: No Has patient had a PCN reaction causing severe rash involving mucus membranes or skin necrosis: No Has patient had a PCN reaction that required hospitalization No Has patient had a PCN  reaction occurring within the last 10 years: No If all of the above answers are "NO", then may proceed with Cephalosporin use.    Current Outpatient Prescriptions  Medication Sig Dispense Refill  . aspirin EC 81 MG tablet Take 81 mg by mouth daily.     Marland Kitchen atorvastatin (LIPITOR) 10 MG tablet Take 10 mg by mouth daily at 6 PM.     . buPROPion (WELLBUTRIN XL) 150 MG 24 hr tablet Take 150 mg by mouth every morning.     . Calcium Carbonate-Vit D-Min (CALCIUM 600+D PLUS MINERALS) 600-400 MG-UNIT TABS Take 1 tablet by mouth 2 (two) times daily.     . fexofenadine (ALLEGRA) 180 MG tablet Take 180 mg by mouth at bedtime.     . gabapentin (NEURONTIN) 300 MG capsule Take 300 mg by mouth at bedtime.  1  . losartan-hydrochlorothiazide (HYZAAR) 100-25 MG per tablet Take 1 tablet by mouth every morning.     . metFORMIN (GLUCOPHAGE) 850 MG tablet Take 850 mg by mouth 2 (two) times daily with a meal.     . PARoxetine (PAXIL) 40 MG tablet Take 40 mg by mouth every morning.     . traMADol (ULTRAM) 50 MG tablet Take 1 tablet (50 mg total) by mouth 2 (two) times daily. 60 tablet 0   No current facility-administered medications for this visit.     OBJECTIVE: Vitals:   09/23/15 1148  BP: (!) 153/97  Pulse: 81  Resp: 18  Temp: 98.1 F (36.7 C)     Body mass index is 35.05 kg/m.    ECOG FS:0 - Asymptomatic  General: Well-developed, well-nourished, no acute distress. Eyes: Pink conjunctiva, anicteric sclera. HEENT: Easily palpable left neck lymphadenopathy. Breast: Left chest wall without evidence of recurrence.  Right breast and axilla without lumps or masses Lungs: Clear to auscultation bilaterally. Heart: Regular rate and rhythm. No rubs, murmurs, or gallops. Abdomen: Soft, nontender, nondistended. No organomegaly noted, normoactive bowel sounds. Musculoskeletal: No edema, cyanosis, or clubbing. Neuro: Alert, answering all questions appropriately. Cranial nerves grossly intact. Skin: No rashes or  petechiae noted. Psych: Normal affect.   LAB RESULTS:  Lab Results  Component Value Date   NA 135 09/23/2015   K 3.7 09/23/2015   CL 101 09/23/2015   CO2 27 09/23/2015   GLUCOSE 106 (H) 09/23/2015   BUN 21 (H) 09/23/2015   CREATININE 0.92 09/23/2015   CALCIUM 9.2 09/23/2015   PROT 7.2 09/23/2015   ALBUMIN 4.2 09/23/2015   AST 41 09/23/2015   ALT 31 09/23/2015   ALKPHOS 81 09/23/2015   BILITOT 0.5 09/23/2015   GFRNONAA >60 09/23/2015   GFRAA >60 09/23/2015    Lab Results  Component Value Date   WBC 5.3 09/23/2015   NEUTROABS 2.0 09/23/2015   HGB 13.2 09/23/2015   HCT 39.1 09/23/2015   MCV 86.6 09/23/2015   PLT 288 09/23/2015     STUDIES: Ct Soft Tissue Neck W Contrast  Result Date: 08/27/2015 CLINICAL DATA:  Left neck mass. Remote history of left-sided breast cancer. EXAM: CT NECK WITH CONTRAST TECHNIQUE: Multidetector CT imaging of the neck was performed using the standard protocol following the bolus administration of intravenous contrast. CONTRAST:  100 mL Isovue 300 COMPARISON:  None. FINDINGS: Pharynx and larynx: No focal mucosal or submucosal lesions are present. Vocal cords are midline and symmetric. The nasopharynx, oropharynx, and hypopharynx are within normal limits. Salivary glands: The submandibular and parotid glands are within normal limits bilaterally. Thyroid: Negative Lymph nodes: An infiltrative soft tissue mass in the lateral left neck measures 2.1 x 1.6 x 2.0 cm. There is a second more medial 10 mm lesion just lateral to the left internal jugular vein at the level 4 station. No other significant cervical adenopathy is present. Vascular: No significant vascular lesions are present. With atherosclerotic calcifications are present at the carotid bifurcations bilaterally without significant stenosis. Limited intracranial: Within normal limits. Visualized orbits: Bilateral lens extractions are present. The globes and orbits are otherwise within normal limits.  Mastoids and visualized paranasal sinuses: Clear Skeleton: Chronic endplate change, loss of disc height, and uncovertebral spurring is present at C5-6 and C6-7 with osseous foraminal narrowing at both levels. There is some straightening of the normal cervical lordosis. No focal lytic or blastic lesions are present. Upper chest: The lung apices are clear. IMPRESSION: 1. 2.1 cm ill-defined left lateral level 5 neck mass. This is most concerning for a metastatic nodal neck mass. This could be related to the patient's breast cancer. 2. A second 10 mm lesion is some rolled density is present more medially adjacent to the left internal jugular vein at the level 4 station. This is also worrisome for metastatic nodal disease. 3. Degenerate spondylosis of the cervical spine is most pronounced at C5-6 and C6-7 with osseous foraminal narrowing at both levels. Electronically Signed   By: San Morelle M.D.   On: 08/27/2015 13:50   Ct Chest W Contrast  Result Date: 08/27/2015 CLINICAL DATA:  65 year old female with a history of inflammatory left breast cancer status post left mastectomy presents with left neck mass. EXAM: CT CHEST, ABDOMEN, AND PELVIS WITH CONTRAST TECHNIQUE: Multidetector CT imaging of the chest, abdomen and pelvis was performed following the standard protocol during bolus administration of intravenous contrast. CONTRAST:  100 cc Isovue-300 IV. COMPARISON:  12/30/2009 chest CT. 02/13/2008 PET-CT. 10/08/2007 CT chest, abdomen and pelvis. FINDINGS: CT CHEST FINDINGS Mediastinum/Nodes: Top-normal heart size. No significant pericardial fluid/thickening. Left anterior descending and right coronary atherosclerosis. Atherosclerotic nonaneurysmal thoracic aorta. Normal caliber pulmonary arteries. No central pulmonary emboli. No discrete thyroid nodules. Unremarkable esophagus. There is enlarged 2.2 cm left level 5 neck lymph node (series 2/image 8), which demonstrates ill-defined margins. There is a mildly  enlarged 1.1 cm subcarinal node (series 2/ image 32), new since 2011. No axillary adenopathy. No additional pathologically enlarged mediastinal or hilar nodes. Lungs/Pleura: No pneumothorax. No pleural effusion. No acute consolidative airspace disease, lung masses or significant pulmonary nodules. There is patchy peribronchovascular and subpleural ground-glass attenuation and subpleural reticulation throughout both lungs without craniocaudal gradient, new since 2011. No significant traction bronchiectasis. No frank honeycombing. 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 Musculoskeletal: No aggressive appearing focal osseous lesions. Mild thoracic spondylosis. Partially visualized right total shoulder arthroplasty. Mildly thick walled subcutaneous fluid collection in the left chest wall at the mastectomy site is not appreciably changed since 2011, most consistent with a postoperative seroma. CT ABDOMEN PELVIS FINDINGS Hepatobiliary:  Normal liver size. Suggestion of diffuse hepatic steatosis. No liver mass. Normal gallbladder with no radiopaque cholelithiasis. No biliary ductal dilatation. Pancreas: Normal, with no mass or duct dilation. Spleen: Normal size. No mass. Adrenals/Urinary Tract: Normal adrenals. No renal mass. No hydronephrosis. Parapelvic renal cysts in the left renal sinus. Normal bladder. Stomach/Bowel: Grossly normal stomach. Normal caliber small bowel with no small bowel wall thickening. Normal appendix. Mild sigmoid diverticulosis, with no large bowel wall thickening or pericolonic fat stranding. Vascular/Lymphatic: Atherosclerotic nonaneurysmal abdominal aorta. Patent portal, splenic, hepatic and renal veins. No pathologically enlarged lymph nodes in the abdomen or pelvis. Reproductive: Grossly normal uterus.  No adnexal mass. Other: No pneumoperitoneum, ascites or focal fluid collection. Musculoskeletal: No aggressive  appearing focal osseous lesions. Moderate lumbar spondylosis. IMPRESSION: 1. Left level 5 neck lymphadenopathy with infiltrative appearing margins, worrisome for nodal metastasis. Please see the separate dedicated neck CT report for further details. Tissue sampling advised. 2. Mild subcarinal lymphadenopathy, new since 2011, nodal metastasis not excluded. 3. No additional potential sites of metastatic disease in the chest, abdomen or pelvis. 4. Spectrum of findings in the lungs suggestive of an interstitial lung disease such as nonspecific interstitial pneumonia (NSIP). If clinically warranted, a follow-up high-resolution chest CT study in 6-12 months is recommended to assess for pattern stability. 5. Additional findings include aortic atherosclerosis, 2 vessel coronary atherosclerosis, chronic subcutaneous postoperative seroma at the left mastectomy site, probable diffuse hepatic steatosis and mild sigmoid diverticulosis. Electronically Signed   By: Ilona Sorrel M.D.   On: 08/27/2015 13:20   Ct Abdomen Pelvis W Contrast  Result Date: 08/27/2015 CLINICAL DATA:  65 year old female with a history of inflammatory left breast cancer status post left mastectomy presents with left neck mass. EXAM: CT CHEST, ABDOMEN, AND PELVIS WITH CONTRAST TECHNIQUE: Multidetector CT imaging of the chest, abdomen and pelvis was performed following the standard protocol during bolus administration of intravenous contrast. CONTRAST:  100 cc Isovue-300 IV. COMPARISON:  12/30/2009 chest CT. 02/13/2008 PET-CT. 10/08/2007 CT chest, abdomen and pelvis. FINDINGS: CT CHEST FINDINGS Mediastinum/Nodes: Top-normal heart size. No significant pericardial fluid/thickening. Left anterior descending and right coronary atherosclerosis. Atherosclerotic nonaneurysmal thoracic aorta. Normal caliber pulmonary arteries. No central pulmonary emboli. No discrete thyroid nodules. Unremarkable esophagus. There is enlarged 2.2 cm left level 5 neck lymph node  (series 2/image 8), which demonstrates ill-defined margins. There is a mildly enlarged 1.1 cm subcarinal node (series 2/ image 32), new since 2011. No axillary adenopathy. No additional pathologically enlarged mediastinal or hilar nodes. Lungs/Pleura: No pneumothorax. No pleural effusion. No acute consolidative airspace disease, lung masses or significant pulmonary nodules. There is patchy peribronchovascular and subpleural ground-glass attenuation and subpleural reticulation throughout both lungs without craniocaudal gradient, new since 2011. No significant traction bronchiectasis. No frank honeycombing. 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 Musculoskeletal: No aggressive appearing focal osseous lesions. Mild thoracic spondylosis. Partially visualized right total shoulder arthroplasty. Mildly thick walled subcutaneous fluid collection in the left chest wall at the mastectomy site is not appreciably changed since 2011, most consistent with a postoperative seroma. CT ABDOMEN PELVIS FINDINGS Hepatobiliary: Normal liver size. Suggestion of diffuse hepatic steatosis. No liver mass. Normal gallbladder with no radiopaque cholelithiasis. No biliary ductal dilatation. Pancreas: Normal, with no mass or duct dilation. Spleen: Normal size. No mass. Adrenals/Urinary Tract: Normal adrenals. No renal mass. No hydronephrosis. Parapelvic renal cysts in the left renal sinus. Normal bladder. Stomach/Bowel: Grossly normal stomach. Normal caliber small bowel with  no small bowel wall thickening. Normal appendix. Mild sigmoid diverticulosis, with no large bowel wall thickening or pericolonic fat stranding. Vascular/Lymphatic: Atherosclerotic nonaneurysmal abdominal aorta. Patent portal, splenic, hepatic and renal veins. No pathologically enlarged lymph nodes in the abdomen or pelvis. Reproductive: Grossly normal uterus.  No adnexal mass. Other: No  pneumoperitoneum, ascites or focal fluid collection. Musculoskeletal: No aggressive appearing focal osseous lesions. Moderate lumbar spondylosis. IMPRESSION: 1. Left level 5 neck lymphadenopathy with infiltrative appearing margins, worrisome for nodal metastasis. Please see the separate dedicated neck CT report for further details. Tissue sampling advised. 2. Mild subcarinal lymphadenopathy, new since 2011, nodal metastasis not excluded. 3. No additional potential sites of metastatic disease in the chest, abdomen or pelvis. 4. Spectrum of findings in the lungs suggestive of an interstitial lung disease such as nonspecific interstitial pneumonia (NSIP). If clinically warranted, a follow-up high-resolution chest CT study in 6-12 months is recommended to assess for pattern stability. 5. Additional findings include aortic atherosclerosis, 2 vessel coronary atherosclerosis, chronic subcutaneous postoperative seroma at the left mastectomy site, probable diffuse hepatic steatosis and mild sigmoid diverticulosis. Electronically Signed   By: Ilona Sorrel M.D.   On: 08/27/2015 13:20   Nm Pet Image Restag (ps) Skull Base To Thigh  Result Date: 09/08/2015 CLINICAL DATA:  Initial treatment strategy for history of inflammatory type breast cancer with left-sided cervical adenopathy. Breast biopsy 2009. Recent biopsy of left supraclavicular lesion demonstrating metastatic breast cancer. EXAM: NUCLEAR MEDICINE PET SKULL BASE TO THIGH TECHNIQUE: 12.5 mCi F-18 FDG was injected intravenously. Full-ring PET imaging was performed from the skull base to thigh after the radiotracer. CT data was obtained and used for attenuation correction and anatomic localization. FASTING BLOOD GLUCOSE:  Value: 118 mg/dl COMPARISON:  Neck, chest, abdomen, and pelvis CTs of 08/27/2015. FINDINGS: NECK Left-sided level 5 hypermetabolic node is relatively ill-defined and measures 1.6 cm. This hypermetabolic, measuring a S.U.V. max of 10.8 on image  47/series 3. CHEST Hypermetabolic thoracic nodes. Bilateral suprahilar hypermetabolic nodes. An index low subcarinal node measures 11 mm and a S.U.V. max of 14.8 on image 91/series 3. ABDOMEN/PELVIS No soft tissue hypermetabolism within the abdomen or pelvis. SKELETON Anterior inferior L4 hypermetabolic focus measures a S.U.V. max of 10.1. There is a tiny left iliac lesion which measures a S.U.V. max of 5.0 and is relatively CT occult. CT IMAGES PERFORMED FOR ATTENUATION CORRECTION Right shoulder arthroplasty. Chest, abdomen, pelvic findings deferred to recent diagnostic CTs. No acute superimposed process. Lad coronary artery atherosclerosis. Left chest wall seroma. Mild cardiomegaly. Hepatic steatosis. IMPRESSION: 1. Nodal metastasis within the neck and chest. 2. Osseous metastasis including within the L4 vertebral body. 3. No soft tissue metastasis below the diaphragm. Electronically Signed   By: Abigail Miyamoto M.D.   On: 09/08/2015 15:08   US Biopsy  Result Date: 08/30/2015 INDICATION: 65 year old with history of inflammatory left breast carcinoma. Patient has a new palpable lesion in the left lower neck. Tissue diagnosis is needed. EXAM: ULTRASOUND BIOPSY OF LEFT NECK LESION MEDICATIONS: None. ANESTHESIA/SEDATION: None FLUOROSCOPY TIME:  None COMPLICATIONS: None immediate. PROCEDURE: The procedure was explained to the patient. The risks and benefits of the procedure were discussed and the patient's questions were addressed. Informed consent was obtained from the patient. The palpable area in the left lower neck was evaluated with ultrasound. This area was prepped with chlorhexidine and a sterile field was created. Soft tissues were anesthetized with 1% lidocaine. Using ultrasound guidance, 5 core biopsies were obtained using an 18 gauge core device. Four  specimens placed in formalin. One specimen was placed on a Telfa pad. Bandage placed over the puncture site. FINDINGS: Hard palpable lesion in left lower  neck. This lesion is very heterogeneous and poorly defined on ultrasound. Unable to identify distinct margins. Core biopsies obtained from the central hypoechoic area. No significant bleeding following the core biopsies. IMPRESSION: Successful ultrasound-guided core biopsies of the heterogeneous left neck mass. Electronically Signed   By: Markus Daft M.D.   On: 08/30/2015 11:01   Dg Chest Port 1 View  Result Date: 09/17/2015 CLINICAL DATA:  Postop chest port placement EXAM: DG C-ARM 1-60 MIN-NO REPORT; PORTABLE CHEST - 1 VIEW COMPARISON:  Chest radiograph dated 06/03/2015 FINDINGS: Lungs are clear.  No pleural effusion or pneumothorax. The heart is normal in size. Right chest port terminates at the cavoatrial junction. Right shoulder arthroplasty. IMPRESSION: Right chest port terminates at the cavoatrial junction. Electronically Signed   By: Julian Hy M.D.   On: 09/17/2015 14:15   Dg C-arm 1-60 Min-no Report  Result Date: 09/17/2015 CLINICAL DATA:  Postop chest port placement EXAM: DG C-ARM 1-60 MIN-NO REPORT; PORTABLE CHEST - 1 VIEW COMPARISON:  Chest radiograph dated 06/03/2015 FINDINGS: Lungs are clear.  No pleural effusion or pneumothorax. The heart is normal in size. Right chest port terminates at the cavoatrial junction. Right shoulder arthroplasty. IMPRESSION: Right chest port terminates at the cavoatrial junction. Electronically Signed   By: Julian Hy M.D.   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 Now with biopsy proven stage IV disease with lymph node and bone metastasis.  PLAN:    1.  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  Patient completed 5 years of Arimidex  in June of 2015. After lengthy discussion with the patient, she wishes to take a more aggressive approach to her malignancy. Patient qualifies for clinical trial "ACCRU" since she has a life expectancy of greater than 12 weeks and will be randomized later today or tomorrow to receive either tamoxifen or halaven. Will reimage with PET scan proximally in 3 months.  If patient has an excellent response to treatment, she can be placed back on an aromatase inhibitor. Return to clinic on September 27, 2015 for consideration of cycle 1, day 1.  2.  Osteopenia: Patient's bone mineral density from December 24, 2012 reported a T score of -1.2.  She has elected to disconitune Fosamax.  Continue calcium and vitamin D.  Approximately 30 minutes was spent in discussion of which greater than 50% was consultation.  Patient expressed understanding and was in agreement with this plan. She also understands that She can call clinic at any time with any questions, concerns, or complaints.   Breast cancer   Staging form: Breast, AJCC 7th Edition     Pathologic stage from 08/11/2014: Stage IIIA (T0, N2a, cM0) - Signed by Lloyd Huger, MD on 08/11/2014   Lloyd Huger, MD   09/26/2015 10:43 AM

## 2015-09-22 NOTE — Telephone Encounter (Signed)
Questions answered. Patient will likely enroll in the trial.   Amanda [name]- can you call her today?  Thanks!

## 2015-09-23 ENCOUNTER — Other Ambulatory Visit: Payer: Self-pay

## 2015-09-23 ENCOUNTER — Inpatient Hospital Stay (HOSPITAL_BASED_OUTPATIENT_CLINIC_OR_DEPARTMENT_OTHER): Payer: Managed Care, Other (non HMO) | Admitting: Oncology

## 2015-09-23 ENCOUNTER — Ambulatory Visit
Admission: RE | Admit: 2015-09-23 | Discharge: 2015-09-23 | Disposition: A | Discharge status: Home / Self Care | Payer: Managed Care, Other (non HMO) | Source: Ambulatory Visit | Attending: Oncology | Admitting: Oncology

## 2015-09-23 ENCOUNTER — Inpatient Hospital Stay: Payer: Managed Care, Other (non HMO) | Attending: Oncology

## 2015-09-23 VITALS — BP 153/97 | HR 81 | Temp 98.1°F | Resp 18 | Ht 65.0 in | Wt 210.6 lb

## 2015-09-23 DIAGNOSIS — C50912 Malignant neoplasm of unspecified site of left female breast: Secondary | ICD-10-CM | POA: Diagnosis present

## 2015-09-23 DIAGNOSIS — K219 Gastro-esophageal reflux disease without esophagitis: Secondary | ICD-10-CM | POA: Insufficient documentation

## 2015-09-23 DIAGNOSIS — C778 Secondary and unspecified malignant neoplasm of lymph nodes of multiple regions: Secondary | ICD-10-CM | POA: Diagnosis not present

## 2015-09-23 DIAGNOSIS — Z853 Personal history of malignant neoplasm of breast: Secondary | ICD-10-CM | POA: Diagnosis not present

## 2015-09-23 DIAGNOSIS — Z7982 Long term (current) use of aspirin: Secondary | ICD-10-CM | POA: Insufficient documentation

## 2015-09-23 DIAGNOSIS — E785 Hyperlipidemia, unspecified: Secondary | ICD-10-CM | POA: Insufficient documentation

## 2015-09-23 DIAGNOSIS — C7951 Secondary malignant neoplasm of bone: Secondary | ICD-10-CM | POA: Diagnosis not present

## 2015-09-23 DIAGNOSIS — Z7984 Long term (current) use of oral hypoglycemic drugs: Secondary | ICD-10-CM

## 2015-09-23 DIAGNOSIS — G4700 Insomnia, unspecified: Secondary | ICD-10-CM | POA: Insufficient documentation

## 2015-09-23 DIAGNOSIS — M179 Osteoarthritis of knee, unspecified: Secondary | ICD-10-CM | POA: Diagnosis not present

## 2015-09-23 DIAGNOSIS — F329 Major depressive disorder, single episode, unspecified: Secondary | ICD-10-CM | POA: Insufficient documentation

## 2015-09-23 DIAGNOSIS — I10 Essential (primary) hypertension: Secondary | ICD-10-CM

## 2015-09-23 DIAGNOSIS — E119 Type 2 diabetes mellitus without complications: Secondary | ICD-10-CM

## 2015-09-23 DIAGNOSIS — Z79899 Other long term (current) drug therapy: Secondary | ICD-10-CM

## 2015-09-23 DIAGNOSIS — M8580 Other specified disorders of bone density and structure, unspecified site: Secondary | ICD-10-CM | POA: Diagnosis not present

## 2015-09-23 DIAGNOSIS — Z006 Encounter for examination for normal comparison and control in clinical research program: Secondary | ICD-10-CM | POA: Diagnosis not present

## 2015-09-23 DIAGNOSIS — Z5111 Encounter for antineoplastic chemotherapy: Secondary | ICD-10-CM | POA: Insufficient documentation

## 2015-09-23 DIAGNOSIS — Z170 Estrogen receptor positive status [ER+]: Secondary | ICD-10-CM | POA: Insufficient documentation

## 2015-09-23 LAB — CBC WITH DIFFERENTIAL/PLATELET
BASOS ABS: 0.1 10*3/uL (ref 0–0.1)
Basophils Relative: 1 %
EOS PCT: 25 %
Eosinophils Absolute: 1.3 10*3/uL — ABNORMAL HIGH (ref 0–0.7)
HCT: 39.1 % (ref 35.0–47.0)
HEMOGLOBIN: 13.2 g/dL (ref 12.0–16.0)
LYMPHS ABS: 1.3 10*3/uL (ref 1.0–3.6)
LYMPHS PCT: 24 %
MCH: 29.2 pg (ref 26.0–34.0)
MCHC: 33.7 g/dL (ref 32.0–36.0)
MCV: 86.6 fL (ref 80.0–100.0)
Monocytes Absolute: 0.6 10*3/uL (ref 0.2–0.9)
Monocytes Relative: 11 %
NEUTROS PCT: 39 %
Neutro Abs: 2 10*3/uL (ref 1.4–6.5)
PLATELETS: 288 10*3/uL (ref 150–440)
RBC: 4.52 MIL/uL (ref 3.80–5.20)
RDW: 14.7 % — ABNORMAL HIGH (ref 11.5–14.5)
WBC: 5.3 10*3/uL (ref 3.6–11.0)

## 2015-09-23 LAB — COMPREHENSIVE METABOLIC PANEL
ALK PHOS: 81 U/L (ref 38–126)
ALT: 31 U/L (ref 14–54)
AST: 41 U/L (ref 15–41)
Albumin: 4.2 g/dL (ref 3.5–5.0)
Anion gap: 7 (ref 5–15)
BUN: 21 mg/dL — AB (ref 6–20)
CALCIUM: 9.2 mg/dL (ref 8.9–10.3)
CHLORIDE: 101 mmol/L (ref 101–111)
CO2: 27 mmol/L (ref 22–32)
CREATININE: 0.92 mg/dL (ref 0.44–1.00)
Glucose, Bld: 106 mg/dL — ABNORMAL HIGH (ref 65–99)
Potassium: 3.7 mmol/L (ref 3.5–5.1)
SODIUM: 135 mmol/L (ref 135–145)
Total Bilirubin: 0.5 mg/dL (ref 0.3–1.2)
Total Protein: 7.2 g/dL (ref 6.5–8.1)

## 2015-09-23 NOTE — Progress Notes (Signed)
States is feeling well. Offers no complaints. 

## 2015-09-24 ENCOUNTER — Other Ambulatory Visit: Payer: Self-pay

## 2015-09-24 DIAGNOSIS — C50919 Malignant neoplasm of unspecified site of unspecified female breast: Secondary | ICD-10-CM | POA: Insufficient documentation

## 2015-09-26 NOTE — Progress Notes (Signed)
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  INTERVAL HISTORY: Patient returns to clinic today for further evaluation and initiation of cycle 1, day 1 of Taxol only on clinical trial. She currently feels well and is asymptomatic. She has no neurologic complaints. She denies any recent fevers or illnesses.  She denies any chest pain or shortness of breath.  She denies any nausea, vomiting, constipation, or diarrhea.  She has no urinary complaints.  Patient offers no specific complaints today.  REVIEW OF SYSTEMS:   Review of Systems  Constitutional: Negative.  Negative for fever, malaise/fatigue and weight loss.  Respiratory: Negative.  Negative for cough and shortness of breath.   Cardiovascular: Negative.  Negative for chest pain.  Gastrointestinal: Negative.  Negative for abdominal pain.  Genitourinary: Negative.   Musculoskeletal: Positive for neck pain.  Neurological: Negative.  Negative for sensory change and weakness.  Endo/Heme/Allergies: Negative.   Psychiatric/Behavioral: The patient is nervous/anxious.     As per HPI. Otherwise, a complete review of systems is negative.  PAST MEDICAL HISTORY: Past Medical History:  Diagnosis Date  . Back pain    occasionally  . Breast cancer ([place])   . Depression    takes Paxil and Wellbutrin daily  . Diabetes ([place])    takes Metformin daily  . GERD (gastroesophageal reflux disease)   . History of bronchitis 2 yrs ago  . Hyperlipidemia    takes Atorvastatin daily  . Hypertension    takes Losartan-HCTZ daily  . Insomnia    takes Ambien nightly    . Insomnia    takes gabapentin nightly  . Joint pain   . Migraine   . Mood swings ([place])   . Osteoarthritis of knee   . PONV (postoperative nausea and vomiting)   . Seasonal allergies    takes Allegra daily    PAST SURGICAL HISTORY: Past Surgical History:  Procedure Laterality Date  . BREAST BIOPSY  2009  . cataract surgery Bilateral   . COLONOSCOPY    . JOINT REPLACEMENT Left 2014   knee  . KNEE ARTHROSCOPY Left    x 5  . MASTECTOMY Left   . port a cath placed    . PORTACATH PLACEMENT N/A 09/17/2015   Procedure: INSERTION PORT-A-CATH;  Surgeon: Jules Husbands, MD;  Location: [facility];  Service: General;  Laterality: N/A;  . TOTAL SHOULDER ARTHROPLASTY Right 06/03/2015   Procedure: TOTAL SHOULDER ARTHROPLASTY;  Surgeon: Tania Ade, MD;  Location: [facility];  Service: Orthopedics;  Laterality: Right;  Right total shoulder arthroplasty  . TOTAL SHOULDER REPLACEMENT Right 06/03/2015  . TUBAL LIGATION      FAMILY HISTORY: Father with non-Hodgkin's lymphoma, 2 paternal aunts with breast cancer.     ADVANCED DIRECTIVES:    HEALTH MAINTENANCE: Social History  Substance Use Topics  . Smoking status: Never Smoker  . Smokeless tobacco: Never Used  . Alcohol use 0.0 oz/week     Comment: occasionally wine     Colonoscopy:  PAP:  Bone density:  Lipid panel:  Allergies  Allergen Reactions  . Ace Inhibitors Cough  . Latex Itching  . Morphine And Related Itching    Caused her  to itch terribly. Would prefer if given to take with a benadryl  . Other Itching    Freeze spray. Patient stated that she may be able to use it now because she doesn't use it as often.  Marland Kitchen Penicillins Rash    Has patient had a PCN reaction causing immediate rash, facial/tongue/throat swelling, SOB or lightheadedness with hypotension: No Has patient had a PCN reaction causing severe rash involving mucus membranes or skin necrosis: No Has patient had a PCN reaction that required hospitalization No Has  patient had a PCN reaction occurring within the last 10 years: No If all of the above answers are "NO", then may proceed with Cephalosporin use.    Current Outpatient Prescriptions  Medication Sig Dispense Refill  . aspirin EC 81 MG tablet Take 81 mg by mouth daily.     Marland Kitchen atorvastatin (LIPITOR) 10 MG tablet Take 10 mg by mouth daily at 6 PM.     . buPROPion (WELLBUTRIN XL) 150 MG 24 hr tablet Take 150 mg by mouth every morning.     . Calcium Carbonate-Vit D-Min (CALCIUM 600+D PLUS MINERALS) 600-400 MG-UNIT TABS Take 1 tablet by mouth 2 (two) times daily.     . fexofenadine (ALLEGRA) 180 MG tablet Take 180 mg by mouth at bedtime.     . gabapentin (NEURONTIN) 300 MG capsule Take 300 mg by mouth at bedtime.  1  . losartan-hydrochlorothiazide (HYZAAR) 100-25 MG per tablet Take 1 tablet by mouth every morning.     . metFORMIN (GLUCOPHAGE) 850 MG tablet Take 850 mg by mouth 2 (two) times daily with a meal.     . PARoxetine (PAXIL) 40 MG tablet Take 40 mg by mouth every morning.     . traMADol (ULTRAM) 50 MG tablet Take 1 tablet (50 mg total) by mouth 2 (two) times daily. 60 tablet 0  . lidocaine-prilocaine (EMLA) cream Apply 1 application topically as needed. Apply to port 1-2 hours prior to chemotherapy appointment. Cover with plastic wrap. 30 g 0   No current facility-administered medications for this visit.    Facility-Administered Medications Ordered in Other Visits  Medication Dose Route Frequency Provider Last Rate Last Dose  . sodium chloride flush (NS) 0.9 % injection 10 mL  10 mL Intracatheter PRN Lloyd Huger, MD   10 mL at 09/27/15 0945    OBJECTIVE: Vitals:   09/27/15 1040  BP: (!) 147/82  Pulse: 82  Resp: 18  Temp: (!) 96.8 F (36 C)     Body mass index is 34.74 kg/m.    ECOG FS:0 - Asymptomatic  General: Well-developed, well-nourished, no acute distress. Eyes: Pink conjunctiva, anicteric sclera. HEENT: Easily palpable left neck lymphadenopathy. Breast: Left chest  wall without evidence of recurrence.  Right breast and axilla without lumps or masses Lungs: Clear to auscultation bilaterally. Heart: Regular rate and rhythm. No rubs, murmurs, or gallops. Abdomen: Soft, nontender, nondistended. No organomegaly noted, normoactive bowel sounds. Musculoskeletal: No edema, cyanosis, or clubbing. Neuro: Alert, answering all questions appropriately. Cranial nerves grossly intact. Skin: No rashes or petechiae noted. Psych: Normal affect.   LAB RESULTS:  Lab Results  Component Value Date   NA 135 09/23/2015   K 3.7 09/23/2015   CL 101 09/23/2015   CO2 27 09/23/2015   GLUCOSE 106 (H) 09/23/2015   BUN 21 (H) 09/23/2015   CREATININE 0.92 09/23/2015   CALCIUM 9.2 09/23/2015   PROT 7.2 09/23/2015   ALBUMIN 4.2 09/23/2015   AST 41 09/23/2015  ALT 31 09/23/2015   ALKPHOS 81 09/23/2015   BILITOT 0.5 09/23/2015   GFRNONAA >60 09/23/2015   GFRAA >60 09/23/2015    Lab Results  Component Value Date   WBC 5.3 09/23/2015   NEUTROABS 2.0 09/23/2015   HGB 13.2 09/23/2015   HCT 39.1 09/23/2015   MCV 86.6 09/23/2015   PLT 288 09/23/2015     STUDIES: Nm Pet Image Restag (ps) Skull Base To Thigh  Result Date: 09/08/2015 CLINICAL DATA:  Initial treatment strategy for history of inflammatory type breast cancer with left-sided cervical adenopathy. Breast biopsy 2009. Recent biopsy of left supraclavicular lesion demonstrating metastatic breast cancer. EXAM: NUCLEAR MEDICINE PET SKULL BASE TO THIGH TECHNIQUE: 12.5 mCi F-18 FDG was injected intravenously. Full-ring PET imaging was performed from the skull base to thigh after the radiotracer. CT data was obtained and used for attenuation correction and anatomic localization. FASTING BLOOD GLUCOSE:  Value: 118 mg/dl COMPARISON:  Neck, chest, abdomen, and pelvis CTs of 08/27/2015. FINDINGS: NECK Left-sided level 5 hypermetabolic node is relatively ill-defined and measures 1.6 cm. This hypermetabolic, measuring a S.U.V.  max of 10.8 on image 47/series 3. CHEST Hypermetabolic thoracic nodes. Bilateral suprahilar hypermetabolic nodes. An index low subcarinal node measures 11 mm and a S.U.V. max of 14.8 on image 91/series 3. ABDOMEN/PELVIS No soft tissue hypermetabolism within the abdomen or pelvis. SKELETON Anterior inferior L4 hypermetabolic focus measures a S.U.V. max of 10.1. There is a tiny left iliac lesion which measures a S.U.V. max of 5.0 and is relatively CT occult. CT IMAGES PERFORMED FOR ATTENUATION CORRECTION Right shoulder arthroplasty. Chest, abdomen, pelvic findings deferred to recent diagnostic CTs. No acute superimposed process. Lad coronary artery atherosclerosis. Left chest wall seroma. Mild cardiomegaly. Hepatic steatosis. IMPRESSION: 1. Nodal metastasis within the neck and chest. 2. Osseous metastasis including within the L4 vertebral body. 3. No soft tissue metastasis below the diaphragm. Electronically Signed   By: Abigail Miyamoto M.D.   On: 09/08/2015 15:08   US Biopsy  Result Date: 08/30/2015 INDICATION: 64 year old with history of inflammatory left breast carcinoma. Patient has a new palpable lesion in the left lower neck. Tissue diagnosis is needed. EXAM: ULTRASOUND BIOPSY OF LEFT NECK LESION MEDICATIONS: None. ANESTHESIA/SEDATION: None FLUOROSCOPY TIME:  None COMPLICATIONS: None immediate. PROCEDURE: The procedure was explained to the patient. The risks and benefits of the procedure were discussed and the patient's questions were addressed. Informed consent was obtained from the patient. The palpable area in the left lower neck was evaluated with ultrasound. This area was prepped with chlorhexidine and a sterile field was created. Soft tissues were anesthetized with 1% lidocaine. Using ultrasound guidance, 5 core biopsies were obtained using an 18 gauge core device. Four specimens placed in formalin. One specimen was placed on a Telfa pad. Bandage placed over the puncture site. FINDINGS: Hard palpable  lesion in left lower neck. This lesion is very heterogeneous and poorly defined on ultrasound. Unable to identify distinct margins. Core biopsies obtained from the central hypoechoic area. No significant bleeding following the core biopsies. IMPRESSION: Successful ultrasound-guided core biopsies of the heterogeneous left neck mass. Electronically Signed   By: Markus Daft M.D.   On: 08/30/2015 11:01   Dg Chest Port 1 View  Result Date: 09/17/2015 CLINICAL DATA:  Postop chest port placement EXAM: DG C-ARM 1-60 MIN-NO REPORT; PORTABLE CHEST - 1 VIEW COMPARISON:  Chest radiograph dated 06/03/2015 FINDINGS: Lungs are clear.  No pleural effusion or pneumothorax. The heart is normal in size. Right chest port terminates  at the cavoatrial junction. Right shoulder arthroplasty. IMPRESSION: Right chest port terminates at the cavoatrial junction. Electronically Signed   By: Julian Hy M.D.   On: 09/17/2015 14:15   Dg C-arm 1-60 Min-no Report  Result Date: 09/17/2015 CLINICAL DATA:  Postop chest port placement EXAM: DG C-ARM 1-60 MIN-NO REPORT; PORTABLE CHEST - 1 VIEW COMPARISON:  Chest radiograph dated 06/03/2015 FINDINGS: Lungs are clear.  No pleural effusion or pneumothorax. The heart is normal in size. Right chest port terminates at the cavoatrial junction. Right shoulder arthroplasty. IMPRESSION: Right chest port terminates at the cavoatrial junction. Electronically Signed   By: Julian Hy M.D.   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 Now with biopsy proven stage IV disease with lymph node and bone metastasis.  PLAN:    1.  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  Patient  completed 5 years of Arimidex in June of 2015. After lengthy discussion with the patient, she wishes to take a more aggressive approach to her malignancy. Patient qualifies for clinical trial "ACCRU" since she has a life expectancy of greater than 12 weeks and has been randomized to the Taxol arm. She will receive single agent Taxol on days 1, 8, and 15 with day 22 off. Will repeat imaging in 12 weeks at which point we will determine whether to place her back on aromatase inhibitor or continue with aggressive chemotherapy. Return to clinic in 1 week for consideration of cycle 1, day 8.  2.  Osteopenia: Patient's bone mineral density from December 24, 2012 reported a T score of -1.2.  She has elected to disconitune Fosamax.  Continue calcium and vitamin D.  Approximately 30 minutes was spent in discussion of which greater than 50% was consultation.  Patient expressed understanding and was in agreement with this plan. She also understands that She can call clinic at any time with any questions, concerns, or complaints.   Breast cancer   Staging form: Breast, AJCC 7th Edition     Pathologic stage from 08/11/2014: Stage IIIA (T0, N2a, cM0) - Signed by Lloyd Huger, MD on 08/11/2014   Lloyd Huger, MD   09/27/2015 4:47 PM

## 2015-09-27 ENCOUNTER — Inpatient Hospital Stay: Payer: Managed Care, Other (non HMO)

## 2015-09-27 ENCOUNTER — Inpatient Hospital Stay (HOSPITAL_BASED_OUTPATIENT_CLINIC_OR_DEPARTMENT_OTHER): Payer: Managed Care, Other (non HMO) | Admitting: Oncology

## 2015-09-27 VITALS — BP 114/74 | HR 88 | Temp 98.1°F | Resp 18

## 2015-09-27 VITALS — BP 147/82 | HR 82 | Temp 96.8°F | Resp 18 | Ht 65.0 in | Wt 208.8 lb

## 2015-09-27 DIAGNOSIS — E119 Type 2 diabetes mellitus without complications: Secondary | ICD-10-CM

## 2015-09-27 DIAGNOSIS — Z853 Personal history of malignant neoplasm of breast: Secondary | ICD-10-CM

## 2015-09-27 DIAGNOSIS — F329 Major depressive disorder, single episode, unspecified: Secondary | ICD-10-CM

## 2015-09-27 DIAGNOSIS — Z006 Encounter for examination for normal comparison and control in clinical research program: Secondary | ICD-10-CM | POA: Diagnosis not present

## 2015-09-27 DIAGNOSIS — Z7984 Long term (current) use of oral hypoglycemic drugs: Secondary | ICD-10-CM

## 2015-09-27 DIAGNOSIS — Z170 Estrogen receptor positive status [ER+]: Secondary | ICD-10-CM

## 2015-09-27 DIAGNOSIS — C787 Secondary malignant neoplasm of liver and intrahepatic bile duct: Secondary | ICD-10-CM | POA: Diagnosis not present

## 2015-09-27 DIAGNOSIS — C7951 Secondary malignant neoplasm of bone: Secondary | ICD-10-CM | POA: Diagnosis not present

## 2015-09-27 DIAGNOSIS — I10 Essential (primary) hypertension: Secondary | ICD-10-CM

## 2015-09-27 DIAGNOSIS — C50912 Malignant neoplasm of unspecified site of left female breast: Secondary | ICD-10-CM

## 2015-09-27 DIAGNOSIS — G4700 Insomnia, unspecified: Secondary | ICD-10-CM

## 2015-09-27 DIAGNOSIS — Z5111 Encounter for antineoplastic chemotherapy: Secondary | ICD-10-CM | POA: Diagnosis not present

## 2015-09-27 DIAGNOSIS — C50919 Malignant neoplasm of unspecified site of unspecified female breast: Secondary | ICD-10-CM

## 2015-09-27 DIAGNOSIS — Z7982 Long term (current) use of aspirin: Secondary | ICD-10-CM

## 2015-09-27 DIAGNOSIS — K219 Gastro-esophageal reflux disease without esophagitis: Secondary | ICD-10-CM

## 2015-09-27 DIAGNOSIS — M8580 Other specified disorders of bone density and structure, unspecified site: Secondary | ICD-10-CM

## 2015-09-27 DIAGNOSIS — Z79899 Other long term (current) drug therapy: Secondary | ICD-10-CM

## 2015-09-27 DIAGNOSIS — M179 Osteoarthritis of knee, unspecified: Secondary | ICD-10-CM

## 2015-09-27 DIAGNOSIS — E785 Hyperlipidemia, unspecified: Secondary | ICD-10-CM

## 2015-09-27 MED ORDER — DEXAMETHASONE SODIUM PHOSPHATE 100 MG/10ML IJ SOLN
10.0000 mg | Freq: Once | INTRAMUSCULAR | Status: AC
  Administered 2015-09-27: 10 mg via INTRAVENOUS
  Filled 2015-09-27: qty 1

## 2015-09-27 MED ORDER — PACLITAXEL CHEMO INJECTION 300 MG/50ML
90.0000 mg/m2 | Freq: Once | INTRAVENOUS | Status: AC
  Administered 2015-09-27: 186 mg via INTRAVENOUS
  Filled 2015-09-27: qty 31

## 2015-09-27 MED ORDER — SODIUM CHLORIDE 0.9% FLUSH
10.0000 mL | INTRAVENOUS | Status: DC | PRN
  Administered 2015-09-27: 10 mL
  Filled 2015-09-27: qty 10

## 2015-09-27 MED ORDER — HEPARIN SOD (PORK) LOCK FLUSH 100 UNIT/ML IV SOLN
500.0000 [IU] | Freq: Once | INTRAVENOUS | Status: AC | PRN
  Administered 2015-09-27: 500 [IU]
  Filled 2015-09-27: qty 5

## 2015-09-27 MED ORDER — LIDOCAINE-PRILOCAINE 2.5-2.5 % EX CREA
1.0000 | TOPICAL_CREAM | CUTANEOUS | 0 refills | Status: AC | PRN
Start: 2015-09-27 — End: ?

## 2015-09-27 MED ORDER — DIPHENHYDRAMINE HCL 50 MG/ML IJ SOLN
50.0000 mg | Freq: Once | INTRAMUSCULAR | Status: AC
  Administered 2015-09-27: 50 mg via INTRAVENOUS
  Filled 2015-09-27: qty 1

## 2015-09-27 MED ORDER — FAMOTIDINE IN NACL 20-0.9 MG/50ML-% IV SOLN
20.0000 mg | Freq: Once | INTRAVENOUS | Status: AC
  Administered 2015-09-27: 20 mg via INTRAVENOUS
  Filled 2015-09-27: qty 50

## 2015-09-27 MED ORDER — SODIUM CHLORIDE 0.9 % IV SOLN
Freq: Once | INTRAVENOUS | Status: AC
  Administered 2015-09-27: 12:00:00 via INTRAVENOUS
  Filled 2015-09-27: qty 1000

## 2015-09-27 NOTE — Progress Notes (Signed)
Patient returns to clinic today, accompanied by her husband, for consideration of Cycle 1 Day 1 Taxol infusion per the protocol for ACCRU [id number]. The patient states she is doing well. She does report some mild insomnia. Dr. Grayland Ormond in to review the plan with the patient today. Dr. Grayland Ormond has signed all orders and all parameters have been met for patient to begin Taxol infusion today. VS and weight are stable. All labs from 09/23/2015 are within acceptable parameters.  The treatment plan was reviewed with the infusion nurse.  The patient was instructed on how to complete the PRO-CTCAE phone survey and it was set up in the office today. The patient completed the PRO-CTCAE phone survey in the presence of both research nurses, Mirian Mo, RN and Yolande Jolly, RN. The patient states that she has some insomnia two-three nights a week but usually gets 9-10 hours of sleep per night. She also reports pain on the survey as almost constantly but when questioned by this nurse, the patient states she reported pain in error and that she does not experience any pain. This was corrected on the printed PRO-CTCAE form.  After completing the phone survey, the patient and her husband met with the education nurse. The appointment schedule was printed and given to the patient. She will return to clinic next Monday October 04, 2015 for cycle 1 day 8 for labs and Taxol infusion. She will return to clinic October 11, 2015 for cycle 1 day 15 for labs and Taxol infusion. Per Dr. Grayland Ormond, the patient may proceed with treatment today. The patient was in agreement. All baseline AE's are listed below.    Amanda [name] [id number]  09/27/2015  Adverse Event Log  Study/Protocol: ACCRU [id number] Cycle: Cycle 1 Day 1  (Baseline)  Event Grade Onset Date Resolved Date Drug Name Attribution Treatment Comments  Insomnia Grade 1 Prior to study ongoing  Taxol Not related Neurontin qhs Pt reports she sleeps 8-10 hrs per night.

## 2015-09-27 NOTE — Patient Instructions (Signed)
Paclitaxel injection What is this medicine? PACLITAXEL (PAK li TAX el) is a chemotherapy drug. It targets fast dividing cells, like cancer cells, and causes these cells to die. This medicine is used to treat ovarian cancer, breast cancer, and other cancers. This medicine may be used for other purposes; ask your health care provider or pharmacist if you have questions. What should I tell my health care provider before I take this medicine? They need to know if you have any of these conditions: -blood disorders -irregular heartbeat -infection (especially a virus infection such as chickenpox, cold sores, or herpes) -liver disease -previous or ongoing radiation therapy -an unusual or allergic reaction to paclitaxel, alcohol, polyoxyethylated castor oil, other chemotherapy agents, other medicines, foods, dyes, or preservatives -pregnant or trying to get pregnant -breast-feeding How should I use this medicine? This drug is given as an infusion into a vein. It is administered in a hospital or clinic by a specially trained health care professional. Talk to your pediatrician regarding the use of this medicine in children. Special care may be needed. Overdosage: If you think you have taken too much of this medicine contact a poison control center or emergency room at once. NOTE: This medicine is only for you. Do not share this medicine with others. What if I miss a dose? It is important not to miss your dose. Call your doctor or health care professional if you are unable to keep an appointment. What may interact with this medicine? Do not take this medicine with any of the following medications: -disulfiram -metronidazole This medicine may also interact with the following medications: -cyclosporine -diazepam -ketoconazole -medicines to increase blood counts like filgrastim, pegfilgrastim, sargramostim -other chemotherapy drugs like cisplatin, doxorubicin, epirubicin, etoposide, teniposide,  vincristine -quinidine -testosterone -vaccines -verapamil Talk to your doctor or health care professional before taking any of these medicines: -acetaminophen -aspirin -ibuprofen -ketoprofen -naproxen This list may not describe all possible interactions. Give your health care provider a list of all the medicines, herbs, non-prescription drugs, or dietary supplements you use. Also tell them if you smoke, drink alcohol, or use illegal drugs. Some items may interact with your medicine. What should I watch for while using this medicine? Your condition will be monitored carefully while you are receiving this medicine. You will need important blood work done while you are taking this medicine. This drug may make you feel generally unwell. This is not uncommon, as chemotherapy can affect healthy cells as well as cancer cells. Report any side effects. Continue your course of treatment even though you feel ill unless your doctor tells you to stop. This medicine can cause serious allergic reactions. To reduce your risk you will need to take other medicine(s) before treatment with this medicine. In some cases, you may be given additional medicines to help with side effects. Follow all directions for their use. Call your doctor or health care professional for advice if you get a fever, chills or sore throat, or other symptoms of a cold or flu. Do not treat yourself. This drug decreases your body's ability to fight infections. Try to avoid being around people who are sick. This medicine may increase your risk to bruise or bleed. Call your doctor or health care professional if you notice any unusual bleeding. Be careful brushing and flossing your teeth or using a toothpick because you may get an infection or bleed more easily. If you have any dental work done, tell your dentist you are receiving this medicine. Avoid taking products that   contain aspirin, acetaminophen, ibuprofen, naproxen, or ketoprofen unless  instructed by your doctor. These medicines may hide a fever. Do not become pregnant while taking this medicine. Women should inform their doctor if they wish to become pregnant or think they might be pregnant. There is a potential for serious side effects to an unborn child. Talk to your health care professional or pharmacist for more information. Do not breast-feed an infant while taking this medicine. Men are advised not to father a child while receiving this medicine. This product may contain alcohol. Ask your pharmacist or healthcare provider if this medicine contains alcohol. Be sure to tell all healthcare providers you are taking this medicine. Certain medicines, like metronidazole and disulfiram, can cause an unpleasant reaction when taken with alcohol. The reaction includes flushing, headache, nausea, vomiting, sweating, and increased thirst. The reaction can last from 30 minutes to several hours. What side effects may I notice from receiving this medicine? Side effects that you should report to your doctor or health care professional as soon as possible: -allergic reactions like skin rash, itching or hives, swelling of the face, lips, or tongue -low blood counts - This drug may decrease the number of white blood cells, red blood cells and platelets. You may be at increased risk for infections and bleeding. -signs of infection - fever or chills, cough, sore throat, pain or difficulty passing urine -signs of decreased platelets or bleeding - bruising, pinpoint red spots on the skin, black, tarry stools, nosebleeds -signs of decreased red blood cells - unusually weak or tired, fainting spells, lightheadedness -breathing problems -chest pain -high or low blood pressure -mouth sores -nausea and vomiting -pain, swelling, redness or irritation at the injection site -pain, tingling, numbness in the hands or feet -slow or irregular heartbeat -swelling of the ankle, feet, hands Side effects that  usually do not require medical attention (report to your doctor or health care professional if they continue or are bothersome): -bone pain -complete hair loss including hair on your head, underarms, pubic hair, eyebrows, and eyelashes -changes in the color of fingernails -diarrhea -loosening of the fingernails -loss of appetite -muscle or joint pain -red flush to skin -sweating This list may not describe all possible side effects. Call your doctor for medical advice about side effects. You may report side effects to FDA at 1-800-FDA-1088. Where should I keep my medicine? This drug is given in a hospital or clinic and will not be stored at home. NOTE: This sheet is a summary. It may not cover all possible information. If you have questions about this medicine, talk to your doctor, pharmacist, or health care provider.    2016, Elsevier/Gold Standard. (2014-08-20 13:02:56)  

## 2015-09-27 NOTE — Progress Notes (Signed)
States is feeling well. Offers no complaints. 

## 2015-09-29 ENCOUNTER — Telehealth: Payer: Self-pay

## 2015-09-29 NOTE — Telephone Encounter (Signed)
T/C to patient to follow up from initial treatment received 09/27/2015. The patient states, "I'm doing great". The patient denies any problems. Encouraged her to call this nurse if she has any problems. The patient verbalized understanding. Mirian Mo, RN, BSN 09/29/2015 4:32 PM

## 2015-09-30 ENCOUNTER — Telehealth: Payer: Self-pay

## 2015-09-30 NOTE — Telephone Encounter (Signed)
The patient called to report that she is experiencing some back pain. She states that she has taken her pain medication as directed and now she is resting in a recliner and drinking plenty of fluids. Instructed her to call back if her pain gets worse. The patient verbalized understanding and agrees.  Informed Dr. Grayland Ormond and he agreed with the plan.  Mirian Mo, RN, BSN 09/30/2015 3:14 PM

## 2015-10-01 ENCOUNTER — Telehealth: Payer: Self-pay

## 2015-10-01 DIAGNOSIS — C50919 Malignant neoplasm of unspecified site of unspecified female breast: Secondary | ICD-10-CM

## 2015-10-01 NOTE — Telephone Encounter (Signed)
Telephone call to patient to follow up from patient's complaint of pain to lower back and joints reported yesterday.  The patient states that she is currently taking Tramadol as directed and that her pain is some better today but still having pain. She is continuing to rest and drink fluids. The patient denies pain w/urination. The patient agrees to call back later today to report any changes in symptoms or if symptoms worsen.  Mirian Mo, RN, BSN 10/01/2015 10:56 AM

## 2015-10-01 NOTE — Telephone Encounter (Signed)
Patient called back at 12:15pm to report she is doing better; reports pain much better. She was reminded to contact us if any changes. Reviewed the cancer center number with patient and instructed to call over weekend or go to ED as needed. Patient verbalized understanding.  Mirian Mo, RN, BSN 10/01/2015 1:05 PM

## 2015-10-03 NOTE — Telephone Encounter (Signed)
I think patient may have an L4 lesion.  Can consider palliative XRT.  Thanks!

## 2015-10-04 ENCOUNTER — Inpatient Hospital Stay: Payer: Managed Care, Other (non HMO)

## 2015-10-04 VITALS — BP 101/68 | HR 72 | Temp 96.4°F | Resp 18

## 2015-10-04 DIAGNOSIS — Z5111 Encounter for antineoplastic chemotherapy: Secondary | ICD-10-CM | POA: Diagnosis not present

## 2015-10-04 DIAGNOSIS — C50919 Malignant neoplasm of unspecified site of unspecified female breast: Secondary | ICD-10-CM

## 2015-10-04 LAB — COMPREHENSIVE METABOLIC PANEL
ALT: 34 U/L (ref 14–54)
AST: 42 U/L — AB (ref 15–41)
Albumin: 4.1 g/dL (ref 3.5–5.0)
Alkaline Phosphatase: 64 U/L (ref 38–126)
Anion gap: 10 (ref 5–15)
BUN: 28 mg/dL — AB (ref 6–20)
CALCIUM: 9 mg/dL (ref 8.9–10.3)
CO2: 24 mmol/L (ref 22–32)
CREATININE: 1.21 mg/dL — AB (ref 0.44–1.00)
Chloride: 100 mmol/L — ABNORMAL LOW (ref 101–111)
GFR, EST AFRICAN AMERICAN: 54 mL/min — AB (ref >60)
GFR, EST NON AFRICAN AMERICAN: 47 mL/min — AB (ref >60)
Glucose, Bld: 188 mg/dL — ABNORMAL HIGH (ref 65–99)
POTASSIUM: 3.9 mmol/L (ref 3.5–5.1)
SODIUM: 134 mmol/L — AB (ref 135–145)
TOTAL PROTEIN: 6.8 g/dL (ref 6.5–8.1)
Total Bilirubin: 0.6 mg/dL (ref 0.3–1.2)

## 2015-10-04 LAB — CBC WITH DIFFERENTIAL/PLATELET
BASOS ABS: 0 10*3/uL (ref 0–0.1)
Basophils Relative: 1 %
EOS ABS: 0.5 10*3/uL (ref 0–0.7)
EOS PCT: 15 %
HCT: 35.5 % (ref 35.0–47.0)
Hemoglobin: 12 g/dL (ref 12.0–16.0)
Lymphocytes Relative: 35 %
Lymphs Abs: 1.3 10*3/uL (ref 1.0–3.6)
MCH: 29.1 pg (ref 26.0–34.0)
MCHC: 33.8 g/dL (ref 32.0–36.0)
MCV: 86.2 fL (ref 80.0–100.0)
MONO ABS: 0.3 10*3/uL (ref 0.2–0.9)
Monocytes Relative: 9 %
Neutro Abs: 1.6 10*3/uL (ref 1.4–6.5)
Neutrophils Relative %: 40 %
PLATELETS: 266 10*3/uL (ref 150–440)
RBC: 4.12 MIL/uL (ref 3.80–5.20)
RDW: 14.7 % — AB (ref 11.5–14.5)
WBC: 3.8 10*3/uL (ref 3.6–11.0)

## 2015-10-04 MED ORDER — DEXTROSE 5 % IV SOLN
90.0000 mg/m2 | Freq: Once | INTRAVENOUS | Status: AC
  Administered 2015-10-04: 186 mg via INTRAVENOUS
  Filled 2015-10-04: qty 31

## 2015-10-04 MED ORDER — DIPHENHYDRAMINE HCL 50 MG/ML IJ SOLN
50.0000 mg | Freq: Once | INTRAMUSCULAR | Status: AC
  Administered 2015-10-04: 50 mg via INTRAVENOUS
  Filled 2015-10-04: qty 1

## 2015-10-04 MED ORDER — DEXAMETHASONE SODIUM PHOSPHATE 100 MG/10ML IJ SOLN
10.0000 mg | Freq: Once | INTRAMUSCULAR | Status: AC
  Administered 2015-10-04: 10 mg via INTRAVENOUS
  Filled 2015-10-04: qty 1

## 2015-10-04 MED ORDER — HEPARIN SOD (PORK) LOCK FLUSH 100 UNIT/ML IV SOLN
500.0000 [IU] | Freq: Once | INTRAVENOUS | Status: AC | PRN
  Administered 2015-10-04: 500 [IU]
  Filled 2015-10-04: qty 5

## 2015-10-04 MED ORDER — SODIUM CHLORIDE 0.9% FLUSH
10.0000 mL | INTRAVENOUS | Status: DC | PRN
  Administered 2015-10-04: 10 mL
  Filled 2015-10-04: qty 10

## 2015-10-04 MED ORDER — FAMOTIDINE IN NACL 20-0.9 MG/50ML-% IV SOLN
20.0000 mg | Freq: Once | INTRAVENOUS | Status: AC
  Administered 2015-10-04: 20 mg via INTRAVENOUS
  Filled 2015-10-04: qty 50

## 2015-10-04 MED ORDER — SODIUM CHLORIDE 0.9 % IV SOLN
Freq: Once | INTRAVENOUS | Status: AC
  Administered 2015-10-04: 10:00:00 via INTRAVENOUS
  Filled 2015-10-04: qty 1000

## 2015-10-04 NOTE — Progress Notes (Signed)
The patient returns to clinic today accompanied by her husband for consideration of  Cycle 1 Day 8 Taxol infusion per the protocol for ACCRU [id number].  The patient reports that she had experienced pain in her lower back and joints following her Taxol infusion last week. She states the pain started on 09/30/2015 and her husband reports that she had been working in the garden pulling weeds which may have contributed to the pain. She states that she is no longer in pain and that it all improved with rest, pain medication--Tramadol and time as of yesterday. Labs were completed and all was reviewed with Dr. Grayland Ormond. The patient has mild hyponatremia (134) grade 1 and increased creatinine (1.21) also grade 1. The patient was encouraged to continue p.o. Fluids. The patient states she drinks one glass of mild with all three meals and drinks flavored water and juice throughout the day.  Platelets, neutrophils and all labs are within acceptable parameters to proceed with Taxol infusion today. Reviewed PRO-CTCAE phone survey.  The patient is planning to travel to [place] this week to visit friends and former neighbors. She reports that she will complete the phone survey as required.  She will return next Monday October 11, 2015 for consideration of cycle 1 day 15 Taxol infusion and labs. Patient already aware of appointment date and time.  Mirian Mo, RN, BSN 10/04/2015 1:51 PM     Malachy Moan [id number]  10/04/2015  Adverse Event Log  Study/Protocol: ACCRU [id number] Cycle: Cycle 1 Day 8    Event Grade Onset Date Resolved Date Drug Name Attribution Treatment Comments  Insomnia Grade 1 Prior to study ongoing  Taxol Not related Neurontin qhs Pt reports she sleeps 8-10 hrs per night.  Hyponatremia (mild) Grade 1 10/04/2015 ongoing Taxol Possibly related None Encouraged to increase p.o. Fluid intake  Increased Creatinine Grade 1 10/04/2015 ongoing Taxol  Possibly related None Encouraged to increase p.o.  Fluid intake   Lower back pain Grade 2 09/30/2015 10/03/2015 Taxol Not related Pain medication Patient reports taking Tramadol as directed and resting.  Joint pain Grade 2 09/30/2015 10/03/2015 Taxol Not related Pain medication Patient reports taking Tramadol as directed and resting.

## 2015-10-11 ENCOUNTER — Inpatient Hospital Stay: Payer: Managed Care, Other (non HMO)

## 2015-10-11 VITALS — BP 132/84 | HR 89 | Temp 97.4°F | Resp 18

## 2015-10-11 DIAGNOSIS — Z5111 Encounter for antineoplastic chemotherapy: Secondary | ICD-10-CM | POA: Diagnosis not present

## 2015-10-11 DIAGNOSIS — C50919 Malignant neoplasm of unspecified site of unspecified female breast: Secondary | ICD-10-CM

## 2015-10-11 LAB — CBC WITH DIFFERENTIAL/PLATELET
BASOS ABS: 0 10*3/uL (ref 0–0.1)
BASOS PCT: 1 %
EOS ABS: 0.3 10*3/uL (ref 0–0.7)
Eosinophils Relative: 9 %
HEMATOCRIT: 35.4 % (ref 35.0–47.0)
HEMOGLOBIN: 11.9 g/dL — AB (ref 12.0–16.0)
Lymphocytes Relative: 38 %
Lymphs Abs: 1.3 10*3/uL (ref 1.0–3.6)
MCH: 28.8 pg (ref 26.0–34.0)
MCHC: 33.5 g/dL (ref 32.0–36.0)
MCV: 85.9 fL (ref 80.0–100.0)
Monocytes Absolute: 0.4 10*3/uL (ref 0.2–0.9)
Monocytes Relative: 12 %
NEUTROS ABS: 1.4 10*3/uL (ref 1.4–6.5)
NEUTROS PCT: 40 %
Platelets: 325 10*3/uL (ref 150–440)
RBC: 4.12 MIL/uL (ref 3.80–5.20)
RDW: 15.1 % — ABNORMAL HIGH (ref 11.5–14.5)
WBC: 3.4 10*3/uL — AB (ref 3.6–11.0)

## 2015-10-11 MED ORDER — FAMOTIDINE IN NACL 20-0.9 MG/50ML-% IV SOLN
20.0000 mg | Freq: Once | INTRAVENOUS | Status: AC
  Administered 2015-10-11: 20 mg via INTRAVENOUS
  Filled 2015-10-11: qty 50

## 2015-10-11 MED ORDER — SODIUM CHLORIDE 0.9 % IV SOLN
Freq: Once | INTRAVENOUS | Status: AC
  Administered 2015-10-11: 10:00:00 via INTRAVENOUS
  Filled 2015-10-11: qty 1000

## 2015-10-11 MED ORDER — DEXTROSE 5 % IV SOLN
90.0000 mg/m2 | Freq: Once | INTRAVENOUS | Status: AC
  Administered 2015-10-11: 186 mg via INTRAVENOUS
  Filled 2015-10-11: qty 31

## 2015-10-11 MED ORDER — DIPHENHYDRAMINE HCL 50 MG/ML IJ SOLN
50.0000 mg | Freq: Once | INTRAMUSCULAR | Status: AC
  Administered 2015-10-11: 50 mg via INTRAVENOUS
  Filled 2015-10-11: qty 1

## 2015-10-11 MED ORDER — HEPARIN SOD (PORK) LOCK FLUSH 100 UNIT/ML IV SOLN
500.0000 [IU] | Freq: Once | INTRAVENOUS | Status: AC | PRN
  Administered 2015-10-11: 500 [IU]
  Filled 2015-10-11: qty 5

## 2015-10-11 MED ORDER — SODIUM CHLORIDE 0.9 % IV SOLN
10.0000 mg | Freq: Once | INTRAVENOUS | Status: AC
  Administered 2015-10-11: 10 mg via INTRAVENOUS
  Filled 2015-10-11: qty 1

## 2015-10-11 MED ORDER — SODIUM CHLORIDE 0.9% FLUSH
10.0000 mL | INTRAVENOUS | Status: DC | PRN
  Filled 2015-10-11: qty 10

## 2015-10-11 NOTE — Progress Notes (Signed)
The patient returns to clinic today accompanied by her husband for consideration of  Cycle 1 Day 15Taxol infusion per the protocol for ACCRU [id number]. The patient reports that her back and joint pain improved with rest and fluids. She and her husband traveled to [place] this past week and she states that she did not experience the pain that she had with her last treatment. She reported some swelling at the incision site of her neck (right side) on one morning but that the swelling has now resolved. She also reports that she still has some discomfort on her left side where she had the mastectomy. Discussed this with Dr. Grayland Ormond who recommended she contact us if swelling returns or discomfort on left side worsens. She will consult with surgeon following chemotherapy for additional treatment. She also has appointment Wednesday with PCP, instructed to follow up with PCP on any new symptoms and to contact us as needed. The patient completed labs today. WBC=3.4 slightly down from last visit on 10/04/2015. Hemoglobin=11.9g/dL.   Platelets and ANC all within acceptable parameters to proceed with treatment today per Dr. Grayland Ormond.  Reviewed PRO-CTCAE phone survey with the patient. No new symptoms noted. Patient will return to clinic on October 25, 2015 for labs, see MD and Taxol infusion. See AE's below.  Mirian Mo, RN, BSN 10/11/2015 1:21 PM    Adverse Event Log  Study/Protocol: ACCRU [id number] Cycle: Cycle 1 Day 15    Event Grade Onset Date Resolved Date Drug Name Attribution Treatment Comments  Insomnia Grade 1 Prior to study ongoing  Taxol Not related Neurontin qhs Pt reports she sleeps 8-10 hrs per night.  Hyponatremia (mild) Grade 1 10/04/2015 ongoing Taxol Possibly related None Encouraged to increase p.o. Fluid intake  Increased Creatinine Grade 1 10/04/2015 ongoing Taxol  Possibly related None Encouraged to increase p.o. Fluid intake   Lower back pain Grade 2 09/30/2015 10/08/2015 Taxol Probably   related Pain medication as needed Patient reports no pain. She states it must have been from doing gardening work previously.  Joint pain Grade 2 09/30/2015 10/08/2015 Taxol Probably related Pain medication as needed Patient reports no pain. She states it must have been from doing gardening work previously.    Mirian Mo, RN, BSN 10/11/2015 1:26 PM

## 2015-10-12 ENCOUNTER — Telehealth: Payer: Self-pay

## 2015-10-12 NOTE — Telephone Encounter (Signed)
Ok, thank you for the update.

## 2015-10-12 NOTE — Telephone Encounter (Addendum)
Patient called to report a mild rash and itching to her port site that developed after yesterday's treatment. The patient stated that she has benedryl cream at home and wanted to know if it's ok to use on the site. She states she thinks she may be allergic to the press and seal tape that was used over her port site. This nurse spoke with infusion nurses and they suggested using a skin prep barrier at her next visit on 10/25/2015. This will be noted on patient's chart. Dr. Gary Fleet nurse was informed. She was instructed to use the benadryl cream and to contact our office if no improvement or if symptoms worsen. Patient also stated that she had 4 episodes of diarrhea the day before her treatment and did not mention to [place] yesterday. She drank 2 glasses of orange juice and ate fruit which she thinks may have contributed to the diarrhea. She agrees to monitor her fruit and juice intake and keep a record of her diet. She agrees to call back at 1:00pm today report how she is doing.  Mirian Mo, RN, BSN 10/12/2015 10:07 AM   Patient returned call to follow up at 1:10pm. Patient states the rash and itching have improved with the benadryl cream and she is feeling better. She agrees to contact us if she has any other concerns or problems.  Mirian Mo, RN, BSN 10/12/2015 1:16 PM

## 2015-10-14 ENCOUNTER — Telehealth: Payer: Self-pay

## 2015-10-14 NOTE — Telephone Encounter (Signed)
Patient called to report she is feeling better today. She states she missed her appointment with her PCP yesterday and it was rescheduled for tomorrow. She states she went shopping with her husband yesterday and felt wiped out and a little nauseated in the car on the way home. After returning home, she took nausea medication and rested. Denies pain, vomiting, and diarrhea. She will continue to rest today and drink fluids and let us know if anything changes. This nurse agrees to the plan. Mirian Mo, RN, BSN 10/14/2015 1:58 PM

## 2015-10-24 ENCOUNTER — Encounter: Payer: Self-pay | Admitting: Surgery

## 2015-10-24 NOTE — Progress Notes (Signed)
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  INTERVAL HISTORY: Patient returns to clinic today for further evaluation and initiation of cycle 2, day 1 of Taxol only on clinical trial. She is tolerating her treatments well without significant side effects. She currently feels well and is asymptomatic. She has no neurologic complaints. She denies any recent fevers or illnesses.  She denies any chest pain or shortness of breath.  She denies any nausea, vomiting, constipation, or diarrhea.  She has no urinary complaints.  Patient offers no specific complaints today.  REVIEW OF SYSTEMS:   Review of Systems  Constitutional: Negative.  Negative for fever, malaise/fatigue and weight loss.  Respiratory: Negative.  Negative for cough and shortness of breath.   Cardiovascular: Negative.  Negative for chest pain.  Gastrointestinal: Negative.  Negative for abdominal pain.  Genitourinary: Negative.   Musculoskeletal: Positive for neck pain.  Neurological: Negative.  Negative for sensory change and weakness.  Endo/Heme/Allergies: Negative.   Psychiatric/Behavioral: Negative.  The patient is not nervous/anxious.     As per HPI. Otherwise, a complete review of systems is negative.  PAST MEDICAL HISTORY: Past Medical History:  Diagnosis Date  . Back pain    occasionally  . Breast cancer ([place])   . Depression    takes Paxil and Wellbutrin daily  . Diabetes ([place])    takes Metformin daily  . GERD (gastroesophageal reflux disease)   . History of bronchitis 2 yrs ago  . Hyperlipidemia    takes Atorvastatin  daily  . Hypertension    takes Losartan-HCTZ daily  . Insomnia    takes Ambien nightly  . Insomnia    takes gabapentin nightly  . Joint pain   . Migraine   . Mood swings ([place])   . Osteoarthritis of knee   . PONV (postoperative nausea and vomiting)   . Seasonal allergies    takes Allegra daily    PAST SURGICAL HISTORY: Past Surgical History:  Procedure Laterality Date  . BREAST BIOPSY  2009  . cataract surgery Bilateral   . COLONOSCOPY    . JOINT REPLACEMENT Left 2014   knee  . KNEE ARTHROSCOPY Left    x 5  . MASTECTOMY Left   . port a cath placed    . PORTACATH PLACEMENT N/A 09/17/2015   Procedure: INSERTION PORT-A-CATH;  Surgeon: Jules Husbands, MD;  Location: [facility];  Service: General;  Laterality: N/A;  . TOTAL SHOULDER ARTHROPLASTY Right 06/03/2015   Procedure: TOTAL SHOULDER ARTHROPLASTY;  Surgeon: Tania Ade, MD;  Location: [facility];  Service: Orthopedics;  Laterality: Right;  Right total shoulder arthroplasty  . TOTAL SHOULDER REPLACEMENT Right 06/03/2015  . TUBAL LIGATION      FAMILY HISTORY: Father with non-Hodgkin's lymphoma, 2 paternal aunts with breast cancer.     ADVANCED DIRECTIVES:    HEALTH MAINTENANCE: Social History  Substance Use Topics  . Smoking status: Never Smoker  . Smokeless tobacco: Never Used  . Alcohol use 0.0 oz/week     Comment: occasionally wine     Colonoscopy:  PAP:  Bone density:  Lipid panel:  Allergies  Allergen Reactions  . Ace Inhibitors Cough  . Latex  Itching  . Morphine And Related Itching    Caused her to itch terribly. Would prefer if given to take with a benadryl  . Other Itching    Freeze spray. Patient stated that she may be able to use it now because she doesn't use it as often.  Marland Kitchen Penicillins Rash    Has patient had a PCN reaction causing immediate rash, facial/tongue/throat swelling, SOB or lightheadedness with hypotension: No Has patient had a PCN reaction causing severe rash involving mucus membranes  or skin necrosis: No Has patient had a PCN reaction that required hospitalization No Has patient had a PCN reaction occurring within the last 10 years: No If all of the above answers are "NO", then may proceed with Cephalosporin use.    Current Outpatient Prescriptions  Medication Sig Dispense Refill  . aspirin EC 81 MG tablet Take 81 mg by mouth daily.     Marland Kitchen atorvastatin (LIPITOR) 10 MG tablet Take 10 mg by mouth daily at 6 PM.     . buPROPion (WELLBUTRIN XL) 150 MG 24 hr tablet Take 150 mg by mouth every morning.     . Calcium Carbonate-Vit D-Min (CALCIUM 600+D PLUS MINERALS) 600-400 MG-UNIT TABS Take 1 tablet by mouth 2 (two) times daily.     . fexofenadine (ALLEGRA) 180 MG tablet Take 180 mg by mouth at bedtime.     . gabapentin (NEURONTIN) 300 MG capsule Take 300 mg by mouth at bedtime.  1  . lidocaine-prilocaine (EMLA) cream Apply 1 application topically as needed. Apply to port 1-2 hours prior to chemotherapy appointment. Cover with plastic wrap. 30 g 0  . losartan-hydrochlorothiazide (HYZAAR) 100-25 MG per tablet Take 1 tablet by mouth every morning.     . metFORMIN (GLUCOPHAGE) 850 MG tablet Take 850 mg by mouth 2 (two) times daily with a meal.     . PARoxetine (PAXIL) 40 MG tablet Take 40 mg by mouth every morning.     . traMADol (ULTRAM) 50 MG tablet Take 1 tablet (50 mg total) by mouth 2 (two) times daily. 60 tablet 0   No current facility-administered medications for this visit.     OBJECTIVE: Vitals:   10/25/15 0948  BP: 102/61  Pulse: 84  Resp: 18  Temp: (!) 95 F (35 C)     Body mass index is 35.35 kg/m.    ECOG FS:0 - Asymptomatic  General: Well-developed, well-nourished, no acute distress. Eyes: Pink conjunctiva, anicteric sclera. HEENT: Easily palpable left neck lymphadenopathy. Breast: Left chest wall without evidence of recurrence.  Right breast and axilla without lumps or masses. Exam deferred today. Lungs: Clear to auscultation bilaterally. Heart: Regular  rate and rhythm. No rubs, murmurs, or gallops. Abdomen: Soft, nontender, nondistended. No organomegaly noted, normoactive bowel sounds. Musculoskeletal: No edema, cyanosis, or clubbing. Neuro: Alert, answering all questions appropriately. Cranial nerves grossly intact. Skin: No rashes or petechiae noted. Psych: Normal affect.   LAB RESULTS:  Lab Results  Component Value Date   NA 136 10/25/2015   K 4.2 10/25/2015   CL 101 10/25/2015   CO2 27 10/25/2015   GLUCOSE 129 (H) 10/25/2015   BUN 19 10/25/2015   CREATININE 1.07 (H) 10/25/2015   CALCIUM 9.3 10/25/2015   PROT 6.5 10/25/2015   ALBUMIN 3.6 10/25/2015   AST 35 10/25/2015   ALT 29 10/25/2015   ALKPHOS 73 10/25/2015   BILITOT 0.6 10/25/2015   GFRNONAA 54 (L) 10/25/2015   GFRAA >60 10/25/2015    Lab Results  Component  Value Date   WBC 6.1 10/25/2015   NEUTROABS 2.8 10/25/2015   HGB 11.7 (L) 10/25/2015   HCT 34.5 (L) 10/25/2015   MCV 86.3 10/25/2015   PLT 325 10/25/2015     STUDIES: No results found.  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 Now with biopsy proven stage IV disease with lymph node and bone metastasis.  PLAN:    1.  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  Patient completed 5 years of Arimidex in June of 2015. Continue treatment on clinical trial "ACCRU" since she has a life expectancy of greater than 12 weeks. She will receive single agent Taxol on days 1, 8, and 15 with day 22 off. Will repeat imaging in 12 weeks at which point we will determine whether to place her back on aromatase inhibitor or continue with aggressive chemotherapy. Return to clinic weekly for consideration of Taxol and then in 3 weeks for  further evaluation. 2.  Osteopenia: Patient's bone mineral density from December 24, 2012 reported a T score of -1.2.  She has elected to disconitune Fosamax.  Continue calcium and vitamin D. 3. Anemia: Mild, monitor.  Approximately 30 minutes was spent in discussion of which greater than 50% was consultation.  Patient expressed understanding and was in agreement with this plan. She also understands that She can call clinic at any time with any questions, concerns, or complaints.   Breast cancer   Staging form: Breast, AJCC 7th Edition     Pathologic stage from 08/11/2014: Stage IIIA (T0, N2a, cM0) - Signed by Lloyd Huger, MD on 08/11/2014   Lloyd Huger, MD   10/27/2015 9:50 AM

## 2015-10-25 ENCOUNTER — Inpatient Hospital Stay: Payer: Managed Care, Other (non HMO)

## 2015-10-25 ENCOUNTER — Inpatient Hospital Stay: Payer: Managed Care, Other (non HMO) | Attending: Oncology | Admitting: Oncology

## 2015-10-25 VITALS — BP 102/61 | HR 84 | Temp 95.0°F | Resp 18 | Wt 212.4 lb

## 2015-10-25 DIAGNOSIS — E785 Hyperlipidemia, unspecified: Secondary | ICD-10-CM | POA: Insufficient documentation

## 2015-10-25 DIAGNOSIS — M8580 Other specified disorders of bone density and structure, unspecified site: Secondary | ICD-10-CM | POA: Insufficient documentation

## 2015-10-25 DIAGNOSIS — D649 Anemia, unspecified: Secondary | ICD-10-CM | POA: Diagnosis not present

## 2015-10-25 DIAGNOSIS — M1710 Unilateral primary osteoarthritis, unspecified knee: Secondary | ICD-10-CM | POA: Diagnosis not present

## 2015-10-25 DIAGNOSIS — I10 Essential (primary) hypertension: Secondary | ICD-10-CM | POA: Diagnosis not present

## 2015-10-25 DIAGNOSIS — Z7982 Long term (current) use of aspirin: Secondary | ICD-10-CM | POA: Diagnosis not present

## 2015-10-25 DIAGNOSIS — F329 Major depressive disorder, single episode, unspecified: Secondary | ICD-10-CM | POA: Insufficient documentation

## 2015-10-25 DIAGNOSIS — C50812 Malignant neoplasm of overlapping sites of left female breast: Secondary | ICD-10-CM | POA: Insufficient documentation

## 2015-10-25 DIAGNOSIS — Z7984 Long term (current) use of oral hypoglycemic drugs: Secondary | ICD-10-CM | POA: Insufficient documentation

## 2015-10-25 DIAGNOSIS — Z170 Estrogen receptor positive status [ER+]: Secondary | ICD-10-CM | POA: Diagnosis not present

## 2015-10-25 DIAGNOSIS — C50919 Malignant neoplasm of unspecified site of unspecified female breast: Secondary | ICD-10-CM

## 2015-10-25 DIAGNOSIS — Z5111 Encounter for antineoplastic chemotherapy: Secondary | ICD-10-CM | POA: Insufficient documentation

## 2015-10-25 DIAGNOSIS — C7951 Secondary malignant neoplasm of bone: Secondary | ICD-10-CM | POA: Insufficient documentation

## 2015-10-25 DIAGNOSIS — K219 Gastro-esophageal reflux disease without esophagitis: Secondary | ICD-10-CM | POA: Diagnosis not present

## 2015-10-25 DIAGNOSIS — Z803 Family history of malignant neoplasm of breast: Secondary | ICD-10-CM | POA: Insufficient documentation

## 2015-10-25 DIAGNOSIS — Z006 Encounter for examination for normal comparison and control in clinical research program: Secondary | ICD-10-CM | POA: Diagnosis not present

## 2015-10-25 DIAGNOSIS — Z79899 Other long term (current) drug therapy: Secondary | ICD-10-CM | POA: Diagnosis not present

## 2015-10-25 DIAGNOSIS — Z9012 Acquired absence of left breast and nipple: Secondary | ICD-10-CM | POA: Diagnosis not present

## 2015-10-25 DIAGNOSIS — E119 Type 2 diabetes mellitus without complications: Secondary | ICD-10-CM | POA: Diagnosis not present

## 2015-10-25 LAB — CBC WITH DIFFERENTIAL/PLATELET
Basophils Absolute: 0.1 10*3/uL (ref 0–0.1)
Basophils Relative: 1 %
EOS PCT: 10 %
Eosinophils Absolute: 0.6 10*3/uL (ref 0–0.7)
HCT: 34.5 % — ABNORMAL LOW (ref 35.0–47.0)
HEMOGLOBIN: 11.7 g/dL — AB (ref 12.0–16.0)
LYMPHS PCT: 24 %
Lymphs Abs: 1.4 10*3/uL (ref 1.0–3.6)
MCH: 29.4 pg (ref 26.0–34.0)
MCHC: 34.1 g/dL (ref 32.0–36.0)
MCV: 86.3 fL (ref 80.0–100.0)
Monocytes Absolute: 1.2 10*3/uL — ABNORMAL HIGH (ref 0.2–0.9)
Monocytes Relative: 20 %
NEUTROS ABS: 2.8 10*3/uL (ref 1.4–6.5)
Neutrophils Relative %: 45 %
PLATELETS: 325 10*3/uL (ref 150–440)
RBC: 3.99 MIL/uL (ref 3.80–5.20)
RDW: 16 % — ABNORMAL HIGH (ref 11.5–14.5)
WBC: 6.1 10*3/uL (ref 3.6–11.0)

## 2015-10-25 LAB — COMPREHENSIVE METABOLIC PANEL
ALK PHOS: 73 U/L (ref 38–126)
ALT: 29 U/L (ref 14–54)
ANION GAP: 8 (ref 5–15)
AST: 35 U/L (ref 15–41)
Albumin: 3.6 g/dL (ref 3.5–5.0)
BUN: 19 mg/dL (ref 6–20)
CALCIUM: 9.3 mg/dL (ref 8.9–10.3)
CHLORIDE: 101 mmol/L (ref 101–111)
CO2: 27 mmol/L (ref 22–32)
Creatinine, Ser: 1.07 mg/dL — ABNORMAL HIGH (ref 0.44–1.00)
GFR calc non Af Amer: 54 mL/min — ABNORMAL LOW (ref >60)
Glucose, Bld: 129 mg/dL — ABNORMAL HIGH (ref 65–99)
POTASSIUM: 4.2 mmol/L (ref 3.5–5.1)
SODIUM: 136 mmol/L (ref 135–145)
Total Bilirubin: 0.6 mg/dL (ref 0.3–1.2)
Total Protein: 6.5 g/dL (ref 6.5–8.1)

## 2015-10-25 MED ORDER — PACLITAXEL CHEMO INJECTION 300 MG/50ML
90.0000 mg/m2 | Freq: Once | INTRAVENOUS | Status: AC
  Administered 2015-10-25: 186 mg via INTRAVENOUS
  Filled 2015-10-25: qty 31

## 2015-10-25 MED ORDER — DEXAMETHASONE SODIUM PHOSPHATE 100 MG/10ML IJ SOLN
10.0000 mg | Freq: Once | INTRAMUSCULAR | Status: AC
  Administered 2015-10-25: 10 mg via INTRAVENOUS
  Filled 2015-10-25: qty 1

## 2015-10-25 MED ORDER — DIPHENHYDRAMINE HCL 50 MG/ML IJ SOLN
50.0000 mg | Freq: Once | INTRAMUSCULAR | Status: AC
  Administered 2015-10-25: 50 mg via INTRAVENOUS
  Filled 2015-10-25: qty 1

## 2015-10-25 MED ORDER — HEPARIN SOD (PORK) LOCK FLUSH 100 UNIT/ML IV SOLN
500.0000 [IU] | Freq: Once | INTRAVENOUS | Status: AC
  Administered 2015-10-25: 500 [IU] via INTRAVENOUS

## 2015-10-25 MED ORDER — SODIUM CHLORIDE 0.9 % IJ SOLN
10.0000 mL | Freq: Once | INTRAMUSCULAR | Status: AC
  Administered 2015-10-25: 10 mL via INTRAVENOUS
  Filled 2015-10-25: qty 10

## 2015-10-25 MED ORDER — SODIUM CHLORIDE 0.9 % IV SOLN
Freq: Once | INTRAVENOUS | Status: AC
  Administered 2015-10-25: 11:00:00 via INTRAVENOUS
  Filled 2015-10-25: qty 1000

## 2015-10-25 MED ORDER — HEPARIN SOD (PORK) LOCK FLUSH 100 UNIT/ML IV SOLN
INTRAVENOUS | Status: AC
  Filled 2015-10-25: qty 5

## 2015-10-25 MED ORDER — FAMOTIDINE IN NACL 20-0.9 MG/50ML-% IV SOLN
20.0000 mg | Freq: Once | INTRAVENOUS | Status: AC
  Administered 2015-10-25: 20 mg via INTRAVENOUS
  Filled 2015-10-25: qty 50

## 2015-10-25 NOTE — Progress Notes (Signed)
States is feeling well. Offers no complaints. 

## 2015-10-25 NOTE — Progress Notes (Signed)
The patient returns today for consideration of Cycle 2 Day 1 of the ACCRU [id number] protocol. The patient states she is doing well. She has no new complaints today. She continues to have insomnia and alopecia. Her PCP prescribed Ambien as needed for insomnia. The patient asked if she could add Vitamin B to her regimen and Dr. Grayland Ormond agrees that Vitamin B complex or Vitamin B6 would be fine to add to her medication regimen. The benefits related to neuropathies were discussed. The patient also asked if THC would be beneficial and Dr. Grayland Ormond stated that it is not recommended for symptoms related to breast cancer or chemotherapy. The labs were reviewed by Dr. Grayland Ormond and they are within acceptable parameters to proceed with treatment today. This nurse reviewed the PRO-CTCAE phone surveys with the patient. She completed the EORTC [id number] questionnaire while in clinic. Appointments were scheduled and given to the patient. She will return next Monday November 01, 2015 for labs and Taxol infusion. AE's and attributions listed below:  Mirian Mo, RN, BSN 10/25/2015 3:15 PM     Adverse Event Log  Study/Protocol: ACCRU [id number] Cycle: Cycle 2 Day 1   Event Grade Onset Date Resolved Date Drug Name Attribution Treatment Comments  Insomnia Grade 1 Prior to study ongoing Taxol Not related Neurontin qhs Pt reports she sleeps 8-10 hrs per night.  Hyponatremia (mild) Grade 1 10/04/2015 10/25/2015 Taxol Possibly related None Improved sodium level 136  Increased Creatinine Grade 1 10/04/2015 ongoing Taxol  Possibly related None Encouraged to increase p.o. Fluid intake   Mirian Mo, RN, BSN 10/25/2015 3:26 PM

## 2015-10-28 ENCOUNTER — Telehealth: Payer: Self-pay

## 2015-10-28 NOTE — Telephone Encounter (Signed)
The patient called to report that she is in bed and is freezing. She states that she feels fine other than just feeling very cold for the last hour and a half. She denies fever, n&v, or any other symptoms. She took two Tylenol and is resting for now. Reminded patient that if she develops a fever or any other symptoms to let us know, patient verbalized understanding. The patient states she will call back before 5:00pm to update or sooner if symptoms worsen.  Mirian Mo, RN, BSN 10/28/2015 2:46 PM

## 2015-10-28 NOTE — Telephone Encounter (Deleted)
The patient called to report that she is in bed and is freezing. She states that she feels fine other than just feeling very cold for the last hour and a half. She denies fever, n&v, or any other symptoms. She took two Tylenol and is resting for now. Reminded patient that if she develops a fever or any other symptoms to let us know, patient verbalized understanding. The patient states she will call back before 5:00pm to update or sooner if symptoms worsen.  Mirian Mo, RN, BSN 10/28/2015 4:57 PM

## 2015-10-28 NOTE — Telephone Encounter (Signed)
The patient returned call to report she is feeling much better. No longer "freezing" cold and Tylenol helped with slight headache as well. Reminded patient to contact us if she develops a fever or other worsening symptoms and gave her contact number for after hours. The patient verbalized understanding. Mirian Mo, RN, BSN 10/28/2015 4:56 PM

## 2015-10-28 NOTE — Telephone Encounter (Signed)
Agreed.  Thank you

## 2015-11-01 ENCOUNTER — Inpatient Hospital Stay: Payer: Managed Care, Other (non HMO)

## 2015-11-01 ENCOUNTER — Other Ambulatory Visit: Payer: Self-pay

## 2015-11-01 VITALS — BP 114/77 | HR 86 | Resp 20

## 2015-11-01 DIAGNOSIS — Z5111 Encounter for antineoplastic chemotherapy: Secondary | ICD-10-CM | POA: Diagnosis not present

## 2015-11-01 DIAGNOSIS — C50919 Malignant neoplasm of unspecified site of unspecified female breast: Secondary | ICD-10-CM

## 2015-11-01 LAB — CBC WITH DIFFERENTIAL/PLATELET
Basophils Absolute: 0 10*3/uL (ref 0–0.1)
Basophils Relative: 1 %
EOS ABS: 0.5 10*3/uL (ref 0–0.7)
EOS PCT: 10 %
HCT: 34.9 % — ABNORMAL LOW (ref 35.0–47.0)
Hemoglobin: 11.9 g/dL — ABNORMAL LOW (ref 12.0–16.0)
LYMPHS ABS: 1.1 10*3/uL (ref 1.0–3.6)
Lymphocytes Relative: 22 %
MCH: 29.5 pg (ref 26.0–34.0)
MCHC: 34.1 g/dL (ref 32.0–36.0)
MCV: 86.4 fL (ref 80.0–100.0)
MONOS PCT: 11 %
Monocytes Absolute: 0.6 10*3/uL (ref 0.2–0.9)
Neutro Abs: 2.9 10*3/uL (ref 1.4–6.5)
Neutrophils Relative %: 56 %
PLATELETS: 351 10*3/uL (ref 150–440)
RBC: 4.04 MIL/uL (ref 3.80–5.20)
RDW: 16.4 % — ABNORMAL HIGH (ref 11.5–14.5)
WBC: 5.1 10*3/uL (ref 3.6–11.0)

## 2015-11-01 MED ORDER — DEXAMETHASONE SODIUM PHOSPHATE 10 MG/ML IJ SOLN
10.0000 mg | Freq: Once | INTRAMUSCULAR | Status: AC
  Administered 2015-11-01: 10 mg via INTRAVENOUS
  Filled 2015-11-01: qty 1

## 2015-11-01 MED ORDER — FAMOTIDINE IN NACL 20-0.9 MG/50ML-% IV SOLN
20.0000 mg | Freq: Once | INTRAVENOUS | Status: AC
  Administered 2015-11-01: 20 mg via INTRAVENOUS
  Filled 2015-11-01: qty 50

## 2015-11-01 MED ORDER — HEPARIN SOD (PORK) LOCK FLUSH 100 UNIT/ML IV SOLN
500.0000 [IU] | Freq: Once | INTRAVENOUS | Status: AC | PRN
  Administered 2015-11-01: 500 [IU]
  Filled 2015-11-01: qty 5

## 2015-11-01 MED ORDER — SODIUM CHLORIDE 0.9 % IV SOLN: 10.0000 mg | Freq: Once | INTRAVENOUS | Status: DC

## 2015-11-01 MED ORDER — DIPHENHYDRAMINE HCL 50 MG/ML IJ SOLN
50.0000 mg | Freq: Once | INTRAMUSCULAR | Status: AC
  Administered 2015-11-01: 50 mg via INTRAVENOUS
  Filled 2015-11-01: qty 1

## 2015-11-01 MED ORDER — SODIUM CHLORIDE 0.9 % IV SOLN
Freq: Once | INTRAVENOUS | Status: AC
  Administered 2015-11-01: 10:00:00 via INTRAVENOUS
  Filled 2015-11-01: qty 1000

## 2015-11-01 MED ORDER — PACLITAXEL CHEMO INJECTION 300 MG/50ML
90.0000 mg/m2 | Freq: Once | INTRAVENOUS | Status: AC
  Administered 2015-11-01: 186 mg via INTRAVENOUS
  Filled 2015-11-01: qty 31

## 2015-11-01 MED ORDER — SODIUM CHLORIDE 0.9% FLUSH
10.0000 mL | INTRAVENOUS | Status: DC | PRN
  Administered 2015-11-01: 10 mL
  Filled 2015-11-01: qty 10

## 2015-11-01 NOTE — Progress Notes (Signed)
The patient return to clinic today accompanied by a friend from her church for consideration of Cycle 2 Day 8 Taxol infusion per the protocol for ACCRU [id number] I. The patient reports that she is doing well overall. She states that she has been having headaches about every other day and that she takes Tylenol and rests until they resolve. She also reports starting Ambien, as prescribed by her PCP,  for her insomnia which she only takes occasionally, usually every two-three days and she continues the gabapentin as prescribed. Reviewed the PRO-CTCAE phone survey with the patient.  CBC was reviewed with Dr. Grayland Ormond and all labs are within acceptable parameters to proceed with Taxol infusion today. The patient agrees to contact our office for any signs or symptoms that are new or worsening. Appointments were scheduled for the month of November and given to the patient. She will return to clinic next week for consideration of Cycle 2 Day 15 labs and Taxol infusion. AE's and attributions all listed below: Mirian Mo, RN, BSN 11/01/2015 10:06 AM       Adverse Event Log  Study/Protocol: ACCRU [id number] Cycle: Cycle 2 Day 8   Event Grade Onset Date Resolved Date Drug Name Attribution Treatment Comments  Insomnia Grade 1 Prior to study ongoing Taxol Not related Neurontin qhs Pt reports she sleeps 8-10 hrs per night.  Increased Creatinine Grade 1 10/04/2015 ongoing Taxol  Possibly related None Encouraged to increase p.o. Fluid intake   Headaches Grade 1 10/28/2015 ongoing Taxol Not related OTC Acetaminophen Patient reports taking Tylenol and resting  Fatigue Grade 1 10/08/2015 ongoing Taxol Probably related None Monitor  Mirian Mo, RN, BSN 11/01/2015 10:10 AM

## 2015-11-02 ENCOUNTER — Ambulatory Visit: Payer: Managed Care, Other (non HMO)

## 2015-11-02 ENCOUNTER — Other Ambulatory Visit: Payer: Managed Care, Other (non HMO)

## 2015-11-02 NOTE — Telephone Encounter (Signed)
Received phone call from patient stating that she is starting to have some difficulty with her legs following the Taxol infusion. She states that her legs feel like they are "moving and jumping" and that she can't sit still and felt unsteady. She states that she noticed it with her last two infusions and that by the time she returned home the symptoms went away. The patient reports she was able to rest yesterday and that she was able to sleep well last night. She questioned what was causing the leg symptoms and it was explained to her that the Taxol is most likely the cause of the symptoms.  The patient agrees to notify our office if her symptoms return or if she experiences any worsening of symptoms.  Mirian Mo, RN, BSN 11/02/2015 9:57 AM

## 2015-11-02 NOTE — Telephone Encounter (Signed)
Ok, thank you

## 2015-11-04 ENCOUNTER — Telehealth: Payer: Self-pay

## 2015-11-04 NOTE — Telephone Encounter (Signed)
Patient called to report lower back pain, initially reported yesterday. Patient reports pain at  a  5 or 6 per 0/10 pain scale. She is taking Tylenol as directed with little relief. Patient states she will take prescribed Tramadol and report back later after she has rested. She is concerned about past scans and the fact that she continues to have lower back pain. Reported to Dr. Grayland Ormond and the plan is to continue Tramadol for pain as directed and Dr. Grayland Ormond will refer patient for consult with Dr. Baruch Gouty. Mirian Mo, RN, BSN 11/04/2015 3:04 PM

## 2015-11-04 NOTE — Telephone Encounter (Signed)
We'll send a referral tomorrow.

## 2015-11-04 NOTE — Telephone Encounter (Signed)
Thank you :)

## 2015-11-05 ENCOUNTER — Telehealth: Payer: Self-pay

## 2015-11-05 NOTE — Telephone Encounter (Signed)
Patient called to report that an appointment (radiation consult) was made with Dr. Baruch Gouty for Wednesday October 25th. The patient also reports that she forgot to inform research nurse that she has had numbness in her second toe right foot for the past year.  Mirian Mo, RN, BSN 11/05/2015 2:07 PM

## 2015-11-05 NOTE — Telephone Encounter (Signed)
Message sent to scheduling to schedule pt to see Dr. Baruch Gouty. They will notify pt with appt info.

## 2015-11-08 ENCOUNTER — Inpatient Hospital Stay: Payer: Managed Care, Other (non HMO)

## 2015-11-08 ENCOUNTER — Other Ambulatory Visit: Payer: Self-pay | Admitting: Oncology

## 2015-11-08 VITALS — BP 118/71 | HR 78 | Temp 98.2°F | Resp 18

## 2015-11-08 DIAGNOSIS — C50919 Malignant neoplasm of unspecified site of unspecified female breast: Secondary | ICD-10-CM

## 2015-11-08 DIAGNOSIS — Z5111 Encounter for antineoplastic chemotherapy: Secondary | ICD-10-CM | POA: Diagnosis not present

## 2015-11-08 LAB — CBC WITH DIFFERENTIAL/PLATELET
BASOS PCT: 3 %
Basophils Absolute: 0.1 10*3/uL (ref 0–0.1)
EOS ABS: 0.4 10*3/uL (ref 0–0.7)
EOS PCT: 11 %
HCT: 32.9 % — ABNORMAL LOW (ref 35.0–47.0)
HEMOGLOBIN: 11 g/dL — AB (ref 12.0–16.0)
LYMPHS ABS: 0.9 10*3/uL — AB (ref 1.0–3.6)
Lymphocytes Relative: 23 %
MCH: 29.1 pg (ref 26.0–34.0)
MCHC: 33.6 g/dL (ref 32.0–36.0)
MCV: 86.7 fL (ref 80.0–100.0)
MONO ABS: 0.6 10*3/uL (ref 0.2–0.9)
MONOS PCT: 15 %
NEUTROS PCT: 48 %
Neutro Abs: 1.9 10*3/uL (ref 1.4–6.5)
Platelets: 331 10*3/uL (ref 150–440)
RBC: 3.79 MIL/uL — ABNORMAL LOW (ref 3.80–5.20)
RDW: 16.7 % — AB (ref 11.5–14.5)
WBC: 3.9 10*3/uL (ref 3.6–11.0)

## 2015-11-08 MED ORDER — FAMOTIDINE IN NACL 20-0.9 MG/50ML-% IV SOLN
20.0000 mg | Freq: Once | INTRAVENOUS | Status: AC
  Administered 2015-11-08: 20 mg via INTRAVENOUS
  Filled 2015-11-08: qty 50

## 2015-11-08 MED ORDER — HEPARIN SOD (PORK) LOCK FLUSH 100 UNIT/ML IV SOLN
500.0000 [IU] | Freq: Once | INTRAVENOUS | Status: AC
  Administered 2015-11-08: 500 [IU] via INTRAVENOUS

## 2015-11-08 MED ORDER — DEXTROSE 5 % IV SOLN
90.0000 mg/m2 | Freq: Once | INTRAVENOUS | Status: AC
  Administered 2015-11-08: 186 mg via INTRAVENOUS
  Filled 2015-11-08: qty 31

## 2015-11-08 MED ORDER — DEXAMETHASONE SODIUM PHOSPHATE 10 MG/ML IJ SOLN
10.0000 mg | Freq: Once | INTRAMUSCULAR | Status: AC
  Administered 2015-11-08: 10 mg via INTRAVENOUS
  Filled 2015-11-08: qty 1

## 2015-11-08 MED ORDER — DIPHENHYDRAMINE HCL 50 MG/ML IJ SOLN
25.0000 mg | Freq: Once | INTRAMUSCULAR | Status: AC
  Administered 2015-11-08: 25 mg via INTRAVENOUS
  Filled 2015-11-08: qty 1

## 2015-11-08 MED ORDER — SODIUM CHLORIDE 0.9 % IV SOLN
Freq: Once | INTRAVENOUS | Status: AC
  Administered 2015-11-08: 10:00:00 via INTRAVENOUS
  Filled 2015-11-08: qty 1000

## 2015-11-08 MED ORDER — SODIUM CHLORIDE 0.9 % IJ SOLN
10.0000 mL | Freq: Once | INTRAMUSCULAR | Status: AC
  Administered 2015-11-08: 10 mL via INTRAVENOUS
  Filled 2015-11-08: qty 10

## 2015-11-08 MED ORDER — DEXAMETHASONE SODIUM PHOSPHATE 100 MG/10ML IJ SOLN: 10.0000 mg | Freq: Once | INTRAMUSCULAR | Status: DC

## 2015-11-08 NOTE — Progress Notes (Signed)
The patient returns to clinic today accompanied by her daughter,Heather, for consideration of Cycle 2 Day 15 Taxol infusion per the protocol for ACCRU [id number]. The patient reports that she had vomiting on Friday evening 11/05/2015 from 7pm-10pm and that it most likely was due to a grilled cheese sandwich she had eaten earlier that day. The patient states she took her antinausea medication after returning home and was able to sleep through the night without any additional episodes and awoke the next morning without any n/v. She reports having vomited X 4 from 7pm-10pm. Her back pain, as reported on 11/04/2015 continues and she reports taking Tramadol with some relief. She has an appointment for radiation consult with Dr. Baruch Gouty on Wednesday 11/10/2015 to evaluate the back pain. The patient also states she was very active yesterday in attending church and eating lunch with family which she stated could have contributed to her back pain. She reports taking Tramadol with some relief. The patient also reports that she fell this morning at home. She states the lights were off and she tripped over the laundry basket. She denies any injuries but states that she may develop bruising on her knees since she fell on them but no bruising at this time. She also denies dizziness, weakness, or loss of consciousness prior to or after the fall. Instructed the patient to contact us if she develops any symptoms related to the fall or otherwise. The patient agrees with the plan. Dr. Grayland Ormond reviewed the labs for today and agrees to proceed with Taxol infusion, all labs within acceptable parameters. Reviewed the labs with the patient and explained the plan of decreasing the Benadryl and Dexamethasone dosage per Dr. Grayland Ormond to see if this helps with the restless leg symptoms that she has had with previous treatments.  She will now get Benadryl 25mg  and Dexamethasone 10mg  as of today 11/08/2015.  The patient agrees with the plan and  will inform the infusion nurse if symptoms continue even with dosages decreased. The PRO CTCAE phone survey was reviewed with the patient. The patient was informed that her next scheduled visit will be 11/22/2015 and we will schedule next CT scan during the next cycle 3. The patient verbalized understanding. All attributions and grades are listed below:  Mirian Mo, RN, BSN 11/08/2015 10:36 AM     Malachy Moan [id number]  11/08/2015  Adverse Event Log  Study/Protocol: ACCRU [id number] I Cycle: 2 Day 15  Event Grade Onset Date Resolved Date Drug Name Attribution Treatment Comments  Insomnia Grade 1 Prior to study ongoing Taxol Not related   Neurontin qhs Patient continues to report sleeping 8-10 hours a night  Increase creatinine Grade 1 10/04/2015 ongoing Taxol Possibly related None Encouraged to increase p.o. intake  Headaches Grade 1 10/28/2015 ongoing Taxol Not related OTC Acetaminophen Reports mild headache on 11/04/15 and 11/06/15.   Back pain (lower back) Grade 2 11/04/2015 ongoing Taxol Unlikely Prescribed pain medication, referral for radiation consult Patient reports pain following activity, takes Tramadol for relief, referral to Dr. Baruch Gouty for radiation consult 11/10/2015.   Nausea Grade 1 11/05/2015 11/05/2015 Taxol Not related antiemetic Patient reports episode lasted from 7pm-10pm  Vomiting Grade 2 11/05/2015 11/05/2015 Taxol Not related antiemetic Patient reports having 4 episodes between 7pm-10pm  Akathisia (Restless leg syndrome) Grade 1 10/25/2015 and 11/01/2015 11/08/2015 Taxol Possibly related Decrease Benadryl dosage Dr. Grayland Ormond decreased Benadryl to 25mg , Decadron continues at 10mg   Pt. States, "Feels like my legs are jumping and I can't sit still".   Marcie Bal  Felton Clinton, RN, BSN 11/08/2015 10:36 AM

## 2015-11-09 ENCOUNTER — Telehealth: Payer: Self-pay

## 2015-11-09 ENCOUNTER — Other Ambulatory Visit: Payer: Managed Care, Other (non HMO)

## 2015-11-09 ENCOUNTER — Ambulatory Visit: Payer: Managed Care, Other (non HMO)

## 2015-11-09 NOTE — Telephone Encounter (Signed)
Phone call to patient to follow up on restless leg symptoms she reported with last two chemo (Taxol) treatments. The patient states she did not experience any restless leg symptoms or heaviness in her legs with her treatment on 11/08/2015. She thinks the decrease in dosage of the benadryl was successful.  Mirian Mo, RN, BSN 11/09/2015 9:41 AM

## 2015-11-10 ENCOUNTER — Ambulatory Visit
Admission: RE | Admit: 2015-11-10 | Discharge: 2015-11-10 | Disposition: A | Discharge status: Home / Self Care | Payer: Managed Care, Other (non HMO) | Source: Ambulatory Visit | Attending: Radiation Oncology | Admitting: Radiation Oncology

## 2015-11-10 ENCOUNTER — Other Ambulatory Visit: Payer: Self-pay | Admitting: *Deleted

## 2015-11-10 ENCOUNTER — Encounter: Payer: Self-pay | Admitting: Radiation Oncology

## 2015-11-10 ENCOUNTER — Telehealth: Payer: Self-pay

## 2015-11-10 VITALS — BP 122/78 | HR 105 | Temp 97.0°F | Resp 20 | Wt 209.3 lb

## 2015-11-10 DIAGNOSIS — C7951 Secondary malignant neoplasm of bone: Secondary | ICD-10-CM | POA: Insufficient documentation

## 2015-11-10 DIAGNOSIS — G4700 Insomnia, unspecified: Secondary | ICD-10-CM | POA: Diagnosis not present

## 2015-11-10 DIAGNOSIS — M545 Low back pain: Secondary | ICD-10-CM | POA: Diagnosis not present

## 2015-11-10 DIAGNOSIS — C50912 Malignant neoplasm of unspecified site of left female breast: Secondary | ICD-10-CM | POA: Diagnosis not present

## 2015-11-10 DIAGNOSIS — K219 Gastro-esophageal reflux disease without esophagitis: Secondary | ICD-10-CM | POA: Insufficient documentation

## 2015-11-10 DIAGNOSIS — I10 Essential (primary) hypertension: Secondary | ICD-10-CM | POA: Diagnosis not present

## 2015-11-10 DIAGNOSIS — Z807 Family history of other malignant neoplasms of lymphoid, hematopoietic and related tissues: Secondary | ICD-10-CM | POA: Diagnosis not present

## 2015-11-10 DIAGNOSIS — M2550 Pain in unspecified joint: Secondary | ICD-10-CM | POA: Insufficient documentation

## 2015-11-10 DIAGNOSIS — Z7982 Long term (current) use of aspirin: Secondary | ICD-10-CM | POA: Insufficient documentation

## 2015-11-10 DIAGNOSIS — Z923 Personal history of irradiation: Secondary | ICD-10-CM | POA: Insufficient documentation

## 2015-11-10 DIAGNOSIS — Z79899 Other long term (current) drug therapy: Secondary | ICD-10-CM | POA: Diagnosis not present

## 2015-11-10 DIAGNOSIS — Z9012 Acquired absence of left breast and nipple: Secondary | ICD-10-CM | POA: Diagnosis not present

## 2015-11-10 DIAGNOSIS — E785 Hyperlipidemia, unspecified: Secondary | ICD-10-CM | POA: Diagnosis not present

## 2015-11-10 DIAGNOSIS — C50919 Malignant neoplasm of unspecified site of unspecified female breast: Secondary | ICD-10-CM

## 2015-11-10 DIAGNOSIS — M1990 Unspecified osteoarthritis, unspecified site: Secondary | ICD-10-CM | POA: Diagnosis not present

## 2015-11-10 DIAGNOSIS — Z170 Estrogen receptor positive status [ER+]: Secondary | ICD-10-CM | POA: Insufficient documentation

## 2015-11-10 DIAGNOSIS — Z803 Family history of malignant neoplasm of breast: Secondary | ICD-10-CM | POA: Insufficient documentation

## 2015-11-10 DIAGNOSIS — E119 Type 2 diabetes mellitus without complications: Secondary | ICD-10-CM | POA: Insufficient documentation

## 2015-11-10 DIAGNOSIS — Z8669 Personal history of other diseases of the nervous system and sense organs: Secondary | ICD-10-CM | POA: Diagnosis not present

## 2015-11-10 DIAGNOSIS — C778 Secondary and unspecified malignant neoplasm of lymph nodes of multiple regions: Secondary | ICD-10-CM | POA: Diagnosis not present

## 2015-11-10 MED ORDER — HYDROCODONE-ACETAMINOPHEN 5-325 MG PO TABS: 1.0000 | ORAL_TABLET | Freq: Four times a day (QID) | ORAL | 0 refills | Status: DC | PRN

## 2015-11-10 NOTE — Consult Note (Signed)
NEW PATIENT EVALUATION  Name: Amanda [name]  MRN: [id number]  Date:   11/10/2015     DOB: 1950-02-10   This 65 y.o. female patient presents to the clinic for initial evaluation of stage IV breast cancer in patient previous to treated 7 years prior for inflammatory breast cancer.  REFERRING PHYSICIAN: Sofie Hartigan, MD  CHIEF COMPLAINT:  Chief Complaint  Patient presents with  . Breast Cancer    Pt is here for initial consultation of metastatic breast cancer.      DIAGNOSIS: The encounter diagnosis was Metastatic breast cancer ([place]).   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 She been doing fairly well had completed 5 years of rheumatoid X in June 2015. She did develop some cervical adenopathy in the left side underwent a PET CT scan demonstrated a nodal metastasis in the neck and chest. She also had osseous metastatic disease with hyperemic metabolic activity to L4 vertebral body. She underwent biopsy of her left supraclavicular node which was biopsy positive for carcinoma consistent with breast primary. She's been treated with Taxol although has increasing lower back pain and is now referred for palliative treatment to her lumbar spine. She is ambulating well. She specifically denies any other areas of bone pain. She has no sensory or motor levels described.  PLANNED TREATMENT REGIMEN: Palliative radiation therapy to lumbar spine  PAST MEDICAL HISTORY:  has a past medical history of Back pain; Breast cancer ([place]); Depression; Diabetes ([place]); GERD (gastroesophageal reflux disease); History of bronchitis (2 yrs ago); Hyperlipidemia; Hypertension;  Insomnia; Insomnia; Joint pain; Migraine; Mood swings ([place]); Osteoarthritis of knee; PONV (postoperative nausea and vomiting); and Seasonal allergies.    PAST SURGICAL HISTORY:  Past Surgical History:  Procedure Laterality Date  . BREAST BIOPSY  2009  . cataract surgery Bilateral   . COLONOSCOPY    . JOINT REPLACEMENT Left 2014   knee  . KNEE ARTHROSCOPY Left    x 5  . MASTECTOMY Left   . port a cath placed    . PORTACATH PLACEMENT N/A 09/17/2015   Procedure: INSERTION PORT-A-CATH;  Surgeon: Jules Husbands, MD;  Location: [facility];  Service: General;  Laterality: N/A;  . TOTAL SHOULDER ARTHROPLASTY Right 06/03/2015   Procedure: TOTAL SHOULDER ARTHROPLASTY;  Surgeon: Tania Ade, MD;  Location: [place];  Service: Orthopedics;  Laterality: Right;  Right total shoulder arthroplasty  . TOTAL SHOULDER REPLACEMENT Right 06/03/2015  . TUBAL LIGATION      FAMILY HISTORY: family history includes Atrial fibrillation in her mother; Breast cancer in her maternal aunt and paternal aunt; Heart attack in her maternal uncle; Melanoma in her maternal uncle; Non-Hodgkin's lymphoma in her father.  SOCIAL HISTORY:  reports that she has never smoked. She has never used smokeless tobacco. She reports that she drinks alcohol. She reports that she does not use drugs.  ALLERGIES: Ace inhibitors; Latex; Morphine and related; Other; and Penicillins  MEDICATIONS:  Current Outpatient Prescriptions  Medication Sig Dispense Refill  . aspirin EC 81 MG tablet Take 81 mg by mouth daily.     Marland Kitchen atorvastatin (LIPITOR) 10 MG tablet Take 10 mg by mouth daily at 6 PM.     . buPROPion (WELLBUTRIN XL)  150 MG 24 hr tablet Take 150 mg by mouth every morning.     . Calcium Carbonate-Vit D-Min (CALCIUM 600+D PLUS MINERALS) 600-400 MG-UNIT TABS Take 1 tablet by mouth 2 (two) times daily.     . fexofenadine (ALLEGRA) 180 MG tablet Take 180 mg by mouth at bedtime.     . gabapentin (NEURONTIN) 300 MG capsule Take 300 mg by mouth at  bedtime.  1  . lidocaine-prilocaine (EMLA) cream Apply 1 application topically as needed. Apply to port 1-2 hours prior to chemotherapy appointment. Cover with plastic wrap. 30 g 0  . losartan-hydrochlorothiazide (HYZAAR) 100-25 MG per tablet Take 1 tablet by mouth every morning.     . metFORMIN (GLUCOPHAGE) 850 MG tablet Take 850 mg by mouth 2 (two) times daily with a meal.     . PARoxetine (PAXIL) 40 MG tablet Take 40 mg by mouth every morning.     . traMADol (ULTRAM) 50 MG tablet Take 1 tablet (50 mg total) by mouth 2 (two) times daily. 60 tablet 0  . HYDROcodone-acetaminophen (NORCO/VICODIN) 5-325 MG tablet Take 1-2 tablets by mouth every 6 (six) hours as needed for moderate pain. 30 tablet 0   No current facility-administered medications for this encounter.     ECOG PERFORMANCE STATUS:  1 - Symptomatic but completely ambulatory  REVIEW OF SYSTEMS: Except for the back pain Patient denies any weight loss, fatigue, weakness, fever, chills or night sweats. Patient denies any loss of vision, blurred vision. Patient denies any ringing  of the ears or hearing loss. No irregular heartbeat. Patient denies heart murmur or history of fainting. Patient denies any chest pain or pain radiating to her upper extremities. Patient denies any shortness of breath, difficulty breathing at night, cough or hemoptysis. Patient denies any swelling in the lower legs. Patient denies any nausea vomiting, vomiting of blood, or coffee ground material in the vomitus. Patient denies any stomach pain. Patient states has had normal bowel movements no significant constipation or diarrhea. Patient denies any dysuria, hematuria or significant nocturia. Patient denies any problems walking, swelling in the joints or loss of balance. Patient denies any skin changes, loss of hair or loss of weight. Patient denies any excessive worrying or anxiety or significant depression. Patient denies any problems with insomnia. Patient denies  excessive thirst, polyuria, polydipsia. Patient denies any swollen glands, patient denies easy bruising or easy bleeding. Patient denies any recent infections, allergies or URI. Patient "s visual fields have not changed significantly in recent time.    PHYSICAL EXAM: BP 122/78   Pulse (!) 105   Temp 97 F (36.1 C)   Resp 20   Wt 209 lb 5.2 oz (95 kg)   BMI 34.83 kg/m  Well-developed obese female in NAD. Range of motion of her lower extremities does not elicit pain there is some point tenderness in the lumbar spine. No motor sensory or DTR's are equal symmetric in the upper lower extremities. Well-developed well-nourished patient in NAD. HEENT reveals PERLA, EOMI, discs not visualized.  Oral cavity is clear. No oral mucosal lesions are identified. Neck is clear without evidence of cervical or supraclavicular adenopathy. Lungs are clear to A&P. Cardiac examination is essentially unremarkable with regular rate and rhythm without murmur rub or thrill. Abdomen is benign with no organomegaly or masses noted. Motor sensory and DTR levels are equal and symmetric in the upper and lower extremities. Cranial nerves II through XII are grossly intact. Proprioception is intact. No peripheral adenopathy or edema is identified.  No motor or sensory levels are noted. Crude visual fields are within normal range.  LABORATORY DATA: Pathology reports reviewed    RADIOLOGY RESULTS: PET CT scan reviewed   IMPRESSION: Stage IV breast cancer with metastatic disease in her lumbar spine in 65 year old female now out over 7 years for initial therapy for inflammatory left breast cancer  PLAN: At this time I to go ahead with palliative radiation therapy to her lumbar spine. Would plan on delivering 3000 cGy in 10 fractions. Risks and benefits of treatment including possible GI upset diarrhea skin reaction fatigue alteration of blood counts all were described in detail to the patient. I personally set up and ordered CT  simulation for early next week.There will be extra effort by both professional staff as well as technical staff to coordinate and manage concurrent chemoradiation and ensuing side effects during her treatments.  I would like to take this opportunity to thank you for allowing me to participate in the care of your patient.Armstead Peaks., MD

## 2015-11-10 NOTE — Telephone Encounter (Signed)
Patient called to inform research nurse that she had consult today with Dr. Baruch Gouty, radiaition oncologist and the plan is to begin radiation treatment beginning with simulation next week and radiation to L4 the following week. She also reports Dr. Baruch Gouty wrote a prescription for Hydrocodone q6h for pain. The patient states she is in agreement with the plan.  Mirian Mo, RN, BSN 11/10/2015 4:28 PM

## 2015-11-11 NOTE — Progress Notes (Signed)
Emailed ACCRU study to inquire about how to proceed with plan for patient to receive palliative radiation, beginning next week 11/15/2015 with CT Simulation. Confirmation received from study that patient can proceed with palliative radiation; however, chemotherapy should be held during radiation therapy and restarted no later than 4 weeks. It was also confirmed that measurable disease is outside the RT field so that the patient can be treated. Informed Dr. Grayland Ormond of the plan and he agrees to hold treatment during radiation. Left message on patient's vm to contact this nurse regarding the plan.  Mirian Mo, RN, BSN 11/11/2015 10:50 AM

## 2015-11-15 ENCOUNTER — Ambulatory Visit
Admission: RE | Admit: 2015-11-15 | Discharge: 2015-11-15 | Disposition: A | Discharge status: Home / Self Care | Payer: Managed Care, Other (non HMO) | Source: Ambulatory Visit | Attending: Radiation Oncology | Admitting: Radiation Oncology

## 2015-11-15 DIAGNOSIS — C7951 Secondary malignant neoplasm of bone: Secondary | ICD-10-CM | POA: Diagnosis not present

## 2015-11-16 ENCOUNTER — Other Ambulatory Visit: Payer: Self-pay

## 2015-11-16 DIAGNOSIS — C7951 Secondary malignant neoplasm of bone: Secondary | ICD-10-CM | POA: Diagnosis not present

## 2015-11-16 DIAGNOSIS — C50919 Malignant neoplasm of unspecified site of unspecified female breast: Secondary | ICD-10-CM

## 2015-11-16 NOTE — Progress Notes (Signed)
Called patient to inform of study requirement for ECG. The patient verbalized understanding and agrees to the procedure. Email sent to scheduling to make the appointment for 11/18/2015.  Mirian Mo, RN, BSN 11/16/2015 9:27 AM   ECG appointment scheduled for Thursday November 18, 2015 @10 :15am in the medical mall at [facility]. Patient was informed of the time and date of appointment.  Mirian Mo, RN, BSN 11/16/2015 1:43 PM

## 2015-11-17 ENCOUNTER — Telehealth: Payer: Self-pay

## 2015-11-17 NOTE — Telephone Encounter (Signed)
Patient called to confirm 10:15am appointment for ECG tomorrow. States Amanda [name] does not need [place] to escort Amanda [name] to the medical mall, Amanda [name] husband will accompany Amanda [name] and help Amanda [name] to the appointment. Patient informed this nurse that Amanda [name] will begin radiation treatment on Monday and Amanda [name] was reminded of appointment with Dr. Grayland Ormond on Monday at 8:45am which Amanda [name] will also have labs drawn, complete questionnaires for the study and discuss the plan for restarting chemo. Also reviewed Amanda [name] diet and discussed certain foods that are recommended to avoid during chemotherapy. All patients questions answered to Amanda [name] satisfaction. Amanda Mo, RN, BSN 11/17/2015 4:35 PM

## 2015-11-18 ENCOUNTER — Ambulatory Visit
Admission: RE | Admit: 2015-11-18 | Discharge: 2015-11-18 | Disposition: A | Discharge status: Home / Self Care | Payer: Managed Care, Other (non HMO) | Source: Ambulatory Visit | Attending: Radiation Oncology | Admitting: Radiation Oncology

## 2015-11-18 ENCOUNTER — Ambulatory Visit
Admission: RE | Admit: 2015-11-18 | Discharge: 2015-11-18 | Disposition: A | Discharge status: Home / Self Care | Payer: Managed Care, Other (non HMO) | Source: Ambulatory Visit | Attending: Oncology | Admitting: Oncology

## 2015-11-18 DIAGNOSIS — C50912 Malignant neoplasm of unspecified site of left female breast: Secondary | ICD-10-CM | POA: Diagnosis not present

## 2015-11-18 DIAGNOSIS — C7951 Secondary malignant neoplasm of bone: Secondary | ICD-10-CM | POA: Diagnosis not present

## 2015-11-18 DIAGNOSIS — I452 Bifascicular block: Secondary | ICD-10-CM | POA: Diagnosis not present

## 2015-11-18 DIAGNOSIS — R9431 Abnormal electrocardiogram [ECG] [EKG]: Secondary | ICD-10-CM | POA: Insufficient documentation

## 2015-11-21 NOTE — Progress Notes (Addendum)
[name]  [phone] [phone]) [phone]  ID: Amanda [name] OB: 12/05/1950  MR#: [id number]  OIB#:[id number]  Patient Care Team: Sofie Hartigan, MD as PCP - General (Family Medicine) Jules Husbands, MD as Consulting Physician (General Surgery)  CHIEF COMPLAINT: ER/PR positive, HER-2 negative stage III inflammatory left breast carcinoma, unspecified location. Now with biopsy-proven stage IV disease.  INTERVAL HISTORY: Patient returns to clinic today for further evaluation. Chemotherapy is currently on hold secondary to initiating XRT to her L4 vertebrae for pain. She has some mild neuropathy in her fingertips, but otherwise is tolerating her treatments well. She currently feels well and is asymptomatic. She has no other neurologic complaints. She denies any recent fevers or illnesses.  She denies any chest pain or shortness of breath.  She denies any nausea, vomiting, constipation, or diarrhea.  She has no urinary complaints.  Patient offers no specific complaints today.  REVIEW OF SYSTEMS:   Review of Systems  Constitutional: Negative.  Negative for fever, malaise/fatigue and weight loss.  Respiratory: Negative.  Negative for cough and shortness of breath.   Cardiovascular: Negative.  Negative for chest pain.  Gastrointestinal: Negative.  Negative for abdominal pain.  Genitourinary: Negative.   Musculoskeletal: Positive for back pain.  Neurological: Positive for sensory change. Negative for weakness.  Endo/Heme/Allergies: Negative.   Psychiatric/Behavioral: Negative.  The patient is not nervous/anxious.     As per HPI. Otherwise, a complete review of systems is negative.  PAST MEDICAL HISTORY: Past Medical History:  Diagnosis Date  . Back pain    occasionally  . Breast cancer ([place])   . Depression    takes Paxil and Wellbutrin daily  . Diabetes ([place])    takes Metformin daily  . GERD (gastroesophageal reflux disease)   . History of bronchitis  2 yrs ago  . Hyperlipidemia    takes Atorvastatin daily  . Hypertension    takes Losartan-HCTZ daily  . Insomnia    takes Ambien nightly  . Insomnia    takes gabapentin nightly  . Joint pain   . Migraine   . Mood swings ([place])   . Osteoarthritis of knee   . PONV (postoperative nausea and vomiting)   . Seasonal allergies    takes Allegra daily    PAST SURGICAL HISTORY: Past Surgical History:  Procedure Laterality Date  . BREAST BIOPSY  2009  . cataract surgery Bilateral   . COLONOSCOPY    . JOINT REPLACEMENT Left 2014   knee  . KNEE ARTHROSCOPY Left    x 5  . MASTECTOMY Left   . port a cath placed    . PORTACATH PLACEMENT N/A 09/17/2015   Procedure: INSERTION PORT-A-CATH;  Surgeon: Jules Husbands, MD;  Location: [facility];  Service: General;  Laterality: N/A;  . TOTAL SHOULDER ARTHROPLASTY Right 06/03/2015   Procedure: TOTAL SHOULDER ARTHROPLASTY;  Surgeon: Tania Ade, MD;  Location: [place];  Service: Orthopedics;  Laterality: Right;  Right total shoulder arthroplasty  . TOTAL SHOULDER REPLACEMENT Right 06/03/2015  . TUBAL LIGATION      FAMILY HISTORY: Father with non-Hodgkin's lymphoma, 2 paternal aunts with breast cancer.     ADVANCED DIRECTIVES:    HEALTH MAINTENANCE: Social History  Substance Use Topics  . Smoking status: Never Smoker  . Smokeless tobacco: Never Used  . Alcohol use 0.0 oz/week     Comment: occasionally wine     Colonoscopy:  PAP:  Bone density:  Lipid panel:  Allergies  Allergen Reactions  .  Ace Inhibitors Cough  . Latex Itching  . Morphine And Related Itching    Caused her to itch terribly. Would prefer if given to take with a benadryl  . Other Itching    Freeze spray. Patient stated that she may be able to use it now because she doesn't use it as often.  Marland Kitchen Penicillins Rash    Has patient had a PCN reaction causing immediate rash, facial/tongue/throat swelling, SOB or lightheadedness with hypotension: No Has patient had a PCN  reaction causing severe rash involving mucus membranes or skin necrosis: No Has patient had a PCN reaction that required hospitalization No Has patient had a PCN reaction occurring within the last 10 years: No If all of the above answers are "NO", then may proceed with Cephalosporin use.    Current Outpatient Prescriptions  Medication Sig Dispense Refill  . aspirin EC 81 MG tablet Take 81 mg by mouth daily.     Marland Kitchen atorvastatin (LIPITOR) 10 MG tablet Take 10 mg by mouth daily at 6 PM.     . buPROPion (WELLBUTRIN XL) 150 MG 24 hr tablet Take 150 mg by mouth every morning.     . Calcium Carbonate-Vit D-Min (CALCIUM 600+D PLUS MINERALS) 600-400 MG-UNIT TABS Take 1 tablet by mouth 2 (two) times daily.     . fexofenadine (ALLEGRA) 180 MG tablet Take 180 mg by mouth at bedtime.     . gabapentin (NEURONTIN) 300 MG capsule Take 300 mg by mouth at bedtime.  1  . HYDROcodone-acetaminophen (NORCO/VICODIN) 5-325 MG tablet Take 1-2 tablets by mouth every 6 (six) hours as needed for moderate pain. 30 tablet 0  . lidocaine-prilocaine (EMLA) cream Apply 1 application topically as needed. Apply to port 1-2 hours prior to chemotherapy appointment. Cover with plastic wrap. 30 g 0  . losartan-hydrochlorothiazide (HYZAAR) 100-25 MG per tablet Take 1 tablet by mouth every morning.     . metFORMIN (GLUCOPHAGE) 850 MG tablet Take 850 mg by mouth 2 (two) times daily with a meal.     . PARoxetine (PAXIL) 40 MG tablet Take 40 mg by mouth every morning.     . traMADol (ULTRAM) 50 MG tablet Take 1 tablet (50 mg total) by mouth 2 (two) times daily. 60 tablet 0   No current facility-administered medications for this visit.     OBJECTIVE: Vitals:   11/22/15 0921  BP: 117/62  Pulse: (!) 108  Temp: 97.2 F (36.2 C)     Body mass index is 34.05 kg/m.    ECOG FS:0 - Asymptomatic  General: Well-developed, well-nourished, no acute distress. Eyes: Pink conjunctiva, anicteric sclera. HEENT: Easily palpable left neck  lymphadenopathy. Breast: Left chest wall without evidence of recurrence.  Right breast and axilla without lumps or masses. Exam deferred today. Lungs: Clear to auscultation bilaterally. Heart: Regular rate and rhythm. No rubs, murmurs, or gallops. Abdomen: Soft, nontender, nondistended. No organomegaly noted, normoactive bowel sounds. Musculoskeletal: No edema, cyanosis, or clubbing. Neuro: Alert, answering all questions appropriately. Cranial nerves grossly intact. Skin: No rashes or petechiae noted. Psych: Normal affect.   LAB RESULTS:  Lab Results  Component Value Date   NA 138 11/22/2015   K 4.8 11/22/2015   CL 101 11/22/2015   CO2 26 11/22/2015   GLUCOSE 181 (H) 11/22/2015   BUN 19 11/22/2015   CREATININE 1.12 (H) 11/22/2015   CALCIUM 9.3 11/22/2015   PROT 6.9 11/22/2015   ALBUMIN 3.6 11/22/2015   AST 42 (H) 11/22/2015   ALT 26 11/22/2015  ALKPHOS 63 11/22/2015   BILITOT <0.1 (L) 11/22/2015   GFRNONAA 51 (L) 11/22/2015   GFRAA 59 (L) 11/22/2015    Lab Results  Component Value Date   WBC 6.7 11/22/2015   NEUTROABS 3.8 11/22/2015   HGB 11.4 (L) 11/22/2015   HCT 32.8 (L) 11/22/2015   MCV 88.1 11/22/2015   PLT 349 11/22/2015     STUDIES: No results found.  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 Now with biopsy proven stage IV disease with lymph node and bone metastasis.  PLAN:    1.  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  Patient completed 5 years of Arimidex in June of 2015. Continue treatment on clinical trial "ACCRU" since she has a life expectancy of greater than 12 weeks. She will receive single agent Taxol on days 1, 8, and 15 with day 22 off. Will repeat  imaging with CT scan and bone scan at the conclusion of her chemotherapy at which point we will determine whether to place her back on aromatase inhibitor or continue with aggressive chemotherapy. Chemotherapy is currently on hold secondary to XRT. Return to clinic on December 06, 2015 for consideration of cycle 7. 2.  Osteopenia: Patient's bone mineral density from December 24, 2012 reported a T score of -1.2.  She has elected to disconitune Fosamax.  Continue calcium and vitamin D. 3. Anemia: Mild, monitor. 4. 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 5. Back pain: XRT as above. Continue hydrocodone as needed. 6. EKG: Proceed with routine EKG as dictated per protocol.   Approximately 30 minutes was spent in discussion of which greater than 50% was consultation.  Patient expressed understanding and was in agreement with this plan. She also understands that She can call clinic at any time with any questions, concerns, or complaints.   Breast cancer   Staging form: Breast, AJCC 7th Edition     Pathologic stage from 08/11/2014: Stage IIIA (T0, N2a, cM0) - Signed by Lloyd Huger, MD on 08/11/2014   Lloyd Huger, MD   11/22/2015 9:52 AM

## 2015-11-22 ENCOUNTER — Encounter: Payer: Self-pay | Admitting: Oncology

## 2015-11-22 ENCOUNTER — Ambulatory Visit: Payer: Managed Care, Other (non HMO)

## 2015-11-22 ENCOUNTER — Inpatient Hospital Stay: Payer: Managed Care, Other (non HMO) | Attending: Oncology

## 2015-11-22 ENCOUNTER — Ambulatory Visit
Admission: RE | Admit: 2015-11-22 | Discharge: 2015-11-22 | Disposition: A | Discharge status: Home / Self Care | Payer: Managed Care, Other (non HMO) | Source: Ambulatory Visit | Attending: Radiation Oncology | Admitting: Radiation Oncology

## 2015-11-22 ENCOUNTER — Inpatient Hospital Stay: Payer: Managed Care, Other (non HMO)

## 2015-11-22 ENCOUNTER — Inpatient Hospital Stay (HOSPITAL_BASED_OUTPATIENT_CLINIC_OR_DEPARTMENT_OTHER): Payer: Managed Care, Other (non HMO) | Admitting: Oncology

## 2015-11-22 VITALS — BP 117/62 | HR 108 | Temp 97.2°F | Wt 204.6 lb

## 2015-11-22 DIAGNOSIS — Z7984 Long term (current) use of oral hypoglycemic drugs: Secondary | ICD-10-CM | POA: Diagnosis not present

## 2015-11-22 DIAGNOSIS — K219 Gastro-esophageal reflux disease without esophagitis: Secondary | ICD-10-CM

## 2015-11-22 DIAGNOSIS — I10 Essential (primary) hypertension: Secondary | ICD-10-CM | POA: Insufficient documentation

## 2015-11-22 DIAGNOSIS — Z96652 Presence of left artificial knee joint: Secondary | ICD-10-CM | POA: Insufficient documentation

## 2015-11-22 DIAGNOSIS — D649 Anemia, unspecified: Secondary | ICD-10-CM | POA: Diagnosis not present

## 2015-11-22 DIAGNOSIS — M179 Osteoarthritis of knee, unspecified: Secondary | ICD-10-CM

## 2015-11-22 DIAGNOSIS — Z79899 Other long term (current) drug therapy: Secondary | ICD-10-CM | POA: Diagnosis not present

## 2015-11-22 DIAGNOSIS — G893 Neoplasm related pain (acute) (chronic): Secondary | ICD-10-CM | POA: Insufficient documentation

## 2015-11-22 DIAGNOSIS — Z9012 Acquired absence of left breast and nipple: Secondary | ICD-10-CM | POA: Diagnosis not present

## 2015-11-22 DIAGNOSIS — M8580 Other specified disorders of bone density and structure, unspecified site: Secondary | ICD-10-CM | POA: Diagnosis not present

## 2015-11-22 DIAGNOSIS — C773 Secondary and unspecified malignant neoplasm of axilla and upper limb lymph nodes: Secondary | ICD-10-CM | POA: Insufficient documentation

## 2015-11-22 DIAGNOSIS — F329 Major depressive disorder, single episode, unspecified: Secondary | ICD-10-CM

## 2015-11-22 DIAGNOSIS — Z170 Estrogen receptor positive status [ER+]: Secondary | ICD-10-CM | POA: Diagnosis not present

## 2015-11-22 DIAGNOSIS — Z7982 Long term (current) use of aspirin: Secondary | ICD-10-CM | POA: Diagnosis not present

## 2015-11-22 DIAGNOSIS — R05 Cough: Secondary | ICD-10-CM | POA: Diagnosis not present

## 2015-11-22 DIAGNOSIS — Z006 Encounter for examination for normal comparison and control in clinical research program: Secondary | ICD-10-CM | POA: Diagnosis not present

## 2015-11-22 DIAGNOSIS — C50912 Malignant neoplasm of unspecified site of left female breast: Secondary | ICD-10-CM | POA: Diagnosis not present

## 2015-11-22 DIAGNOSIS — Z96611 Presence of right artificial shoulder joint: Secondary | ICD-10-CM | POA: Diagnosis not present

## 2015-11-22 DIAGNOSIS — E119 Type 2 diabetes mellitus without complications: Secondary | ICD-10-CM | POA: Insufficient documentation

## 2015-11-22 DIAGNOSIS — C50919 Malignant neoplasm of unspecified site of unspecified female breast: Secondary | ICD-10-CM

## 2015-11-22 DIAGNOSIS — C7951 Secondary malignant neoplasm of bone: Secondary | ICD-10-CM

## 2015-11-22 DIAGNOSIS — T451X5S Adverse effect of antineoplastic and immunosuppressive drugs, sequela: Secondary | ICD-10-CM | POA: Diagnosis not present

## 2015-11-22 DIAGNOSIS — E785 Hyperlipidemia, unspecified: Secondary | ICD-10-CM | POA: Insufficient documentation

## 2015-11-22 DIAGNOSIS — Z5111 Encounter for antineoplastic chemotherapy: Secondary | ICD-10-CM | POA: Insufficient documentation

## 2015-11-22 DIAGNOSIS — G620 Drug-induced polyneuropathy: Secondary | ICD-10-CM

## 2015-11-22 LAB — COMPREHENSIVE METABOLIC PANEL
ALBUMIN: 3.6 g/dL (ref 3.5–5.0)
ALT: 26 U/L (ref 14–54)
AST: 42 U/L — AB (ref 15–41)
Alkaline Phosphatase: 63 U/L (ref 38–126)
Anion gap: 11 (ref 5–15)
BUN: 19 mg/dL (ref 6–20)
CHLORIDE: 101 mmol/L (ref 101–111)
CO2: 26 mmol/L (ref 22–32)
CREATININE: 1.12 mg/dL — AB (ref 0.44–1.00)
Calcium: 9.3 mg/dL (ref 8.9–10.3)
GFR calc Af Amer: 59 mL/min — ABNORMAL LOW (ref >60)
GFR calc non Af Amer: 51 mL/min — ABNORMAL LOW (ref >60)
Glucose, Bld: 181 mg/dL — ABNORMAL HIGH (ref 65–99)
Potassium: 4.8 mmol/L (ref 3.5–5.1)
SODIUM: 138 mmol/L (ref 135–145)
Total Bilirubin: <0.1 mg/dL — ABNORMAL LOW (ref 0.3–1.2)
Total Protein: 6.9 g/dL (ref 6.5–8.1)

## 2015-11-22 LAB — CBC WITH DIFFERENTIAL/PLATELET
BASOS ABS: 0.1 10*3/uL (ref 0–0.1)
Basophils Relative: 2 %
EOS ABS: 0.5 10*3/uL (ref 0–0.7)
EOS PCT: 7 %
HCT: 32.8 % — ABNORMAL LOW (ref 35.0–47.0)
Hemoglobin: 11.4 g/dL — ABNORMAL LOW (ref 12.0–16.0)
Lymphocytes Relative: 18 %
Lymphs Abs: 1.2 10*3/uL (ref 1.0–3.6)
MCH: 30.5 pg (ref 26.0–34.0)
MCHC: 34.7 g/dL (ref 32.0–36.0)
MCV: 88.1 fL (ref 80.0–100.0)
Monocytes Absolute: 1.1 10*3/uL — ABNORMAL HIGH (ref 0.2–0.9)
Monocytes Relative: 17 %
Neutro Abs: 3.8 10*3/uL (ref 1.4–6.5)
Neutrophils Relative %: 56 %
PLATELETS: 349 10*3/uL (ref 150–440)
RBC: 3.72 MIL/uL — AB (ref 3.80–5.20)
RDW: 18.3 % — ABNORMAL HIGH (ref 11.5–14.5)
WBC: 6.7 10*3/uL (ref 3.6–11.0)

## 2015-11-22 NOTE — Progress Notes (Signed)
Patient returns to clinic today accompanied by her husband for consideration of cycle 3 day 1 Taxol infusion per the protocol for ACCRU [id number] I. The patient has been experiencing back pain and was sent for a consult with Dr. Baruch Gouty on November 18, 2015 for consideration of palliative radiation to the lumbar spine. Due to protocol requirements, the patient will delay Taxol treatment for at least two weeks beginning today in order to complete 10 fractions of palliative radiation. Dr. Grayland Ormond in to examine patient and discuss the plan of care and possible side effects related to radiation. The plan is to begin first radiation treatment today and to re-evaluate in two weeks to restart chemotherapy.  The patient reports having continued back pain that comes and goes with activity and continues to take prescribed hydrocodone for the pain as needed. The patient also reports numbness in thumbs and fingertips which started last night. No tingling reported at this time. When asked about her toes and feet she was unsure of any numbness or tingling and stated, "I'm not sure about my feet". She reports continued insomnia and denies  Nausea and vomiting.  Dr. Grayland Ormond reviewed her labs and ECG that was performed on 11/18/2015. He recommended she be evaluated by a cardiologist to monitor a right bundle branch block but that it is not clinically significant. The patient verbalized understanding and will make an appointment with a cardiologist in [place]. Reviewed the [place] phone survey with the patient and she completed the EORTC QLQ-CIPN20 Questionnaire. Reviewed conmeds with the patient. The patient asked if she could resume chemotherapy following radiation. Dr. Grayland Ormond explained to her and her husband that per protocol, she will be re-evaluated after the radiation is completed and chemotherapy will then resume within 4 weeks. The patient will return 12/06/2015 to see MD, labs and consider cycle 3 day 1 Taxol infusion. The  patient is scheduled for CT of chest, abdomen and pelvis and a Bone Scan on 12/13/2015.   All AE's and attributions listed below:  Mirian Mo, RN, BSN 11/22/2015 10:52 AM     Adverse Event Log  Study/Protocol: ACCRU [id number] I Cycle: 2  Extension (Treatment Delay)   Event Grade Onset Date Resolved Date Drug Name Attribution Treatment Comments  Insomnia Grade 1 Prior to study ongoing Taxol Unrelated Neurontin qhs   Increased creatinine Grade 1 10/04/2015 ongoing Taxol Possible None Encouraged to increase p.o. intake  Headaches Grade 1 10/28/2015 ongoing Taxol Unrelated OTC Acetaminophen   Back pain (lower back) Grade 2 11/04/2015 ongoing Taxol Unlikely Radiation consult, Hydrocodone as needed Palliative radiation begins today 11/22/2015  Fatigue Grade 1 10/08/2015 ongoing Taxol likely None   Numbness in fingertips Grade 1 11/22/2015 ongoing Taxol related None Patient reports taking vitamin B complex  Mild Anemia Grade 1 10/25/2015 ongoing Taxol likely None   Mirian Mo, RN, BSN 11/22/2015 10:52 AM

## 2015-11-23 ENCOUNTER — Ambulatory Visit
Admission: RE | Admit: 2015-11-23 | Discharge: 2015-11-23 | Disposition: A | Discharge status: Home / Self Care | Payer: Managed Care, Other (non HMO) | Source: Ambulatory Visit | Attending: Radiation Oncology | Admitting: Radiation Oncology

## 2015-11-23 ENCOUNTER — Other Ambulatory Visit: Payer: Self-pay | Admitting: *Deleted

## 2015-11-23 ENCOUNTER — Telehealth: Payer: Self-pay

## 2015-11-23 ENCOUNTER — Ambulatory Visit: Payer: Managed Care, Other (non HMO)

## 2015-11-23 ENCOUNTER — Telehealth: Payer: Self-pay | Admitting: *Deleted

## 2015-11-23 DIAGNOSIS — C7951 Secondary malignant neoplasm of bone: Secondary | ICD-10-CM | POA: Diagnosis not present

## 2015-11-23 MED ORDER — HYDROCODONE-ACETAMINOPHEN 5-325 MG PO TABS: 1.0000 | ORAL_TABLET | Freq: Four times a day (QID) | ORAL | 0 refills | Status: DC | PRN

## 2015-11-23 NOTE — Telephone Encounter (Signed)
Patient called to inform research nurse that her mammogram appointment in [facility] has been resecheduled for December 08, 2015.

## 2015-11-23 NOTE — Telephone Encounter (Signed)
Has an appt scheduled for mammo on 11/20 at [facility] [phone]. She also has an appt for chemo that day and she is requesting we call and reschedule her mammo appt for another day that does not interfere with her treatments

## 2015-11-24 ENCOUNTER — Ambulatory Visit
Admission: RE | Admit: 2015-11-24 | Discharge: 2015-11-24 | Disposition: A | Discharge status: Home / Self Care | Payer: Managed Care, Other (non HMO) | Source: Ambulatory Visit | Attending: Radiation Oncology | Admitting: Radiation Oncology

## 2015-11-24 DIAGNOSIS — C7951 Secondary malignant neoplasm of bone: Secondary | ICD-10-CM | POA: Diagnosis not present

## 2015-11-25 ENCOUNTER — Ambulatory Visit
Admission: RE | Admit: 2015-11-25 | Discharge: 2015-11-25 | Disposition: A | Discharge status: Home / Self Care | Payer: Managed Care, Other (non HMO) | Source: Ambulatory Visit | Attending: Radiation Oncology | Admitting: Radiation Oncology

## 2015-11-25 DIAGNOSIS — C7951 Secondary malignant neoplasm of bone: Secondary | ICD-10-CM | POA: Diagnosis not present

## 2015-11-26 ENCOUNTER — Ambulatory Visit
Admission: RE | Admit: 2015-11-26 | Discharge: 2015-11-26 | Disposition: A | Discharge status: Home / Self Care | Payer: Managed Care, Other (non HMO) | Source: Ambulatory Visit | Attending: Radiation Oncology | Admitting: Radiation Oncology

## 2015-11-26 DIAGNOSIS — C7951 Secondary malignant neoplasm of bone: Secondary | ICD-10-CM | POA: Diagnosis not present

## 2015-11-29 ENCOUNTER — Inpatient Hospital Stay: Payer: Managed Care, Other (non HMO)

## 2015-11-29 ENCOUNTER — Other Ambulatory Visit: Payer: Self-pay | Admitting: *Deleted

## 2015-11-29 ENCOUNTER — Ambulatory Visit
Admission: RE | Admit: 2015-11-29 | Discharge: 2015-11-29 | Disposition: A | Discharge status: Home / Self Care | Payer: Managed Care, Other (non HMO) | Source: Ambulatory Visit | Attending: Radiation Oncology | Admitting: Radiation Oncology

## 2015-11-29 ENCOUNTER — Ambulatory Visit: Payer: Managed Care, Other (non HMO)

## 2015-11-29 DIAGNOSIS — Z170 Estrogen receptor positive status [ER+]: Principal | ICD-10-CM

## 2015-11-29 DIAGNOSIS — C50912 Malignant neoplasm of unspecified site of left female breast: Secondary | ICD-10-CM

## 2015-11-29 DIAGNOSIS — C7951 Secondary malignant neoplasm of bone: Secondary | ICD-10-CM | POA: Diagnosis not present

## 2015-11-29 DIAGNOSIS — Z5111 Encounter for antineoplastic chemotherapy: Secondary | ICD-10-CM | POA: Diagnosis not present

## 2015-11-29 LAB — CBC WITH DIFFERENTIAL/PLATELET
BASOS ABS: 0.1 10*3/uL (ref 0–0.1)
BASOS PCT: 1 %
EOS ABS: 0.9 10*3/uL — AB (ref 0–0.7)
Eosinophils Relative: 16 %
HEMATOCRIT: 33.4 % — AB (ref 35.0–47.0)
HEMOGLOBIN: 11.2 g/dL — AB (ref 12.0–16.0)
Lymphocytes Relative: 16 %
Lymphs Abs: 0.9 10*3/uL — ABNORMAL LOW (ref 1.0–3.6)
MCH: 30 pg (ref 26.0–34.0)
MCHC: 33.7 g/dL (ref 32.0–36.0)
MCV: 89.2 fL (ref 80.0–100.0)
MONOS PCT: 14 %
Monocytes Absolute: 0.8 10*3/uL (ref 0.2–0.9)
NEUTROS ABS: 3 10*3/uL (ref 1.4–6.5)
NEUTROS PCT: 53 %
Platelets: 354 10*3/uL (ref 150–440)
RBC: 3.74 MIL/uL — AB (ref 3.80–5.20)
RDW: 18 % — ABNORMAL HIGH (ref 11.5–14.5)
WBC: 5.6 10*3/uL (ref 3.6–11.0)

## 2015-11-29 LAB — COMPREHENSIVE METABOLIC PANEL
ALBUMIN: 4 g/dL (ref 3.5–5.0)
ALK PHOS: 57 U/L (ref 38–126)
ALT: 26 U/L (ref 14–54)
AST: 41 U/L (ref 15–41)
Anion gap: 9 (ref 5–15)
BILIRUBIN TOTAL: 0.4 mg/dL (ref 0.3–1.2)
BUN: 19 mg/dL (ref 6–20)
CALCIUM: 9.3 mg/dL (ref 8.9–10.3)
CO2: 26 mmol/L (ref 22–32)
CREATININE: 1.03 mg/dL — AB (ref 0.44–1.00)
Chloride: 102 mmol/L (ref 101–111)
GFR calc Af Amer: >60 mL/min (ref >60)
GFR calc non Af Amer: 56 mL/min — ABNORMAL LOW (ref >60)
GLUCOSE: 138 mg/dL — AB (ref 65–99)
Potassium: 4 mmol/L (ref 3.5–5.1)
SODIUM: 137 mmol/L (ref 135–145)
TOTAL PROTEIN: 6.7 g/dL (ref 6.5–8.1)

## 2015-11-30 ENCOUNTER — Ambulatory Visit
Admission: RE | Admit: 2015-11-30 | Discharge: 2015-11-30 | Disposition: A | Discharge status: Home / Self Care | Payer: Managed Care, Other (non HMO) | Source: Ambulatory Visit | Attending: Radiation Oncology | Admitting: Radiation Oncology

## 2015-11-30 DIAGNOSIS — C7951 Secondary malignant neoplasm of bone: Secondary | ICD-10-CM | POA: Diagnosis not present

## 2015-12-01 ENCOUNTER — Ambulatory Visit
Admission: RE | Admit: 2015-12-01 | Discharge: 2015-12-01 | Disposition: A | Discharge status: Home / Self Care | Payer: Managed Care, Other (non HMO) | Source: Ambulatory Visit | Attending: Radiation Oncology | Admitting: Radiation Oncology

## 2015-12-01 DIAGNOSIS — C7951 Secondary malignant neoplasm of bone: Secondary | ICD-10-CM | POA: Diagnosis not present

## 2015-12-02 ENCOUNTER — Other Ambulatory Visit: Payer: Self-pay | Admitting: *Deleted

## 2015-12-02 ENCOUNTER — Ambulatory Visit
Admission: RE | Admit: 2015-12-02 | Discharge: 2015-12-02 | Disposition: A | Discharge status: Home / Self Care | Payer: Managed Care, Other (non HMO) | Source: Ambulatory Visit | Attending: Radiation Oncology | Admitting: Radiation Oncology

## 2015-12-02 DIAGNOSIS — C50912 Malignant neoplasm of unspecified site of left female breast: Secondary | ICD-10-CM

## 2015-12-02 DIAGNOSIS — Z170 Estrogen receptor positive status [ER+]: Principal | ICD-10-CM

## 2015-12-02 DIAGNOSIS — C7951 Secondary malignant neoplasm of bone: Secondary | ICD-10-CM | POA: Diagnosis not present

## 2015-12-03 ENCOUNTER — Other Ambulatory Visit: Payer: Self-pay

## 2015-12-03 ENCOUNTER — Ambulatory Visit
Admission: RE | Admit: 2015-12-03 | Discharge: 2015-12-03 | Disposition: A | Discharge status: Home / Self Care | Payer: Managed Care, Other (non HMO) | Source: Ambulatory Visit | Attending: Radiation Oncology | Admitting: Radiation Oncology

## 2015-12-03 DIAGNOSIS — C7951 Secondary malignant neoplasm of bone: Secondary | ICD-10-CM | POA: Diagnosis not present

## 2015-12-05 NOTE — Progress Notes (Signed)
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  INTERVAL HISTORY: Patient returns to clinic today for further evaluation and reinitiation of chemotherapy. She has now completed XRT to her L4 vertebrae for pain. She has some mild neuropathy in her fingertips, but otherwise is tolerating her treatments well. She has noted some visual changes since starting chemotherapy. She also has a persistent dry cough. She otherwise feels well and is asymptomatic. She has no other neurologic complaints. She denies any recent fevers or illnesses.  She denies any chest pain or shortness of breath.  She denies any nausea, vomiting, constipation, or diarrhea.  She has no urinary complaints.  Patient offers no specific complaints today.  REVIEW OF SYSTEMS:   Review of Systems  Constitutional: Negative.  Negative for fever, malaise/fatigue and weight loss.  Eyes: Positive for blurred vision.  Respiratory: Positive for cough. Negative for shortness of breath.   Cardiovascular: Negative.  Negative for chest pain.  Gastrointestinal: Negative.  Negative for abdominal pain.  Genitourinary: Negative.   Musculoskeletal: Negative.  Negative for back pain.  Neurological: Positive for sensory change. Negative for weakness.  Endo/Heme/Allergies: Negative.   Psychiatric/Behavioral: Negative.  The patient is not nervous/anxious.     As per HPI. Otherwise, a complete review of systems is negative.  PAST MEDICAL HISTORY: Past Medical History:  Diagnosis Date  . Back pain    occasionally  . Breast cancer ([place])   . Depression    takes Paxil and Wellbutrin daily  . Diabetes ([place])    takes Metformin daily  . GERD (gastroesophageal reflux disease)   . History of bronchitis 2 yrs ago  . Hyperlipidemia    takes Atorvastatin daily  . Hypertension    takes Losartan-HCTZ daily  . Insomnia    takes Ambien nightly  . Insomnia    takes gabapentin nightly  . Joint pain   . Migraine   . Mood swings ([place])   . Osteoarthritis of knee   . PONV (postoperative nausea and vomiting)   . Seasonal allergies    takes Allegra daily    PAST SURGICAL HISTORY: Past Surgical History:  Procedure Laterality Date  . BREAST BIOPSY  2009  . cataract surgery Bilateral   . COLONOSCOPY    . JOINT REPLACEMENT Left 2014   knee  . KNEE ARTHROSCOPY Left    x 5  . MASTECTOMY Left   . port a cath placed    . PORTACATH PLACEMENT N/A 09/17/2015   Procedure: INSERTION PORT-A-CATH;  Surgeon: Jules Husbands, MD;  Location: [facility];  Service: General;  Laterality: N/A;  . TOTAL SHOULDER ARTHROPLASTY Right 06/03/2015   Procedure: TOTAL SHOULDER ARTHROPLASTY;  Surgeon: Tania Ade, MD;  Location: [facility];  Service: Orthopedics;  Laterality: Right;  Right total shoulder arthroplasty  . TOTAL SHOULDER REPLACEMENT Right 06/03/2015  . TUBAL LIGATION      FAMILY HISTORY: Father with non-Hodgkin's lymphoma, 2 paternal aunts with breast cancer.     ADVANCED DIRECTIVES:    HEALTH MAINTENANCE: Social History  Substance Use Topics  . Smoking status: Never Smoker  . Smokeless tobacco: Never Used  . Alcohol use 0.0 oz/week  Comment: occasionally wine     Colonoscopy:  PAP:  Bone density:  Lipid panel:  Allergies  Allergen Reactions  . Ace Inhibitors Cough  . Latex Itching  . Morphine And Related Itching    Caused her to itch terribly. Would prefer if given to take with a benadryl  . Other Itching    Freeze spray. Patient stated that she may be able to use it now because she doesn't use it as often.  Marland Kitchen Penicillins Rash    Has  patient had a PCN reaction causing immediate rash, facial/tongue/throat swelling, SOB or lightheadedness with hypotension: No Has patient had a PCN reaction causing severe rash involving mucus membranes or skin necrosis: No Has patient had a PCN reaction that required hospitalization No Has patient had a PCN reaction occurring within the last 10 years: No If all of the above answers are "NO", then may proceed with Cephalosporin use.    Current Outpatient Prescriptions  Medication Sig Dispense Refill  . aspirin EC 81 MG tablet Take 81 mg by mouth daily.     Marland Kitchen atorvastatin (LIPITOR) 10 MG tablet Take 10 mg by mouth daily at 6 PM.     . buPROPion (WELLBUTRIN XL) 150 MG 24 hr tablet Take 150 mg by mouth every morning.     . Calcium Carbonate-Vit D-Min (CALCIUM 600+D PLUS MINERALS) 600-400 MG-UNIT TABS Take 1 tablet by mouth 2 (two) times daily.     . fexofenadine (ALLEGRA) 180 MG tablet Take 180 mg by mouth at bedtime.     . gabapentin (NEURONTIN) 300 MG capsule Take 300 mg by mouth at bedtime.  1  . HYDROcodone-acetaminophen (NORCO/VICODIN) 5-325 MG tablet Take 1-2 tablets by mouth every 6 (six) hours as needed for moderate pain. 30 tablet 0  . lidocaine-prilocaine (EMLA) cream Apply 1 application topically as needed. Apply to port 1-2 hours prior to chemotherapy appointment. Cover with plastic wrap. 30 g 0  . losartan-hydrochlorothiazide (HYZAAR) 100-25 MG per tablet Take 1 tablet by mouth every morning.     . metFORMIN (GLUCOPHAGE) 850 MG tablet Take 850 mg by mouth 2 (two) times daily with a meal.     . PARoxetine (PAXIL) 40 MG tablet Take 40 mg by mouth every morning.     . traMADol (ULTRAM) 50 MG tablet Take 1 tablet (50 mg total) by mouth 2 (two) times daily. 60 tablet 0  . chlorpheniramine-HYDROcodone (TUSSIONEX) 10-8 MG/5ML SUER Take 5 mLs by mouth every 12 (twelve) hours as needed for cough. 140 mL 0   Current Facility-Administered Medications  Medication Dose Route Frequency Provider  Last Rate Last Dose  . guaiFENesin-dextromethorphan (ROBITUSSIN DM) 100-10 MG/5ML syrup 5 mL  5 mL Oral Once Lloyd Huger, MD       Facility-Administered Medications Ordered in Other Visits  Medication Dose Route Frequency Provider Last Rate Last Dose  . HYDROcodone-acetaminophen (NORCO/VICODIN) 5-325 MG per tablet 1 tablet  1 tablet Oral Once Lloyd Huger, MD        OBJECTIVE: Vitals:   12/06/15 0940  BP: 124/82  Pulse: 89  Resp: 18  Temp: 97.9 F (36.6 C)     Body mass index is 34.66 kg/m.    ECOG FS:0 - Asymptomatic  General: Well-developed, well-nourished, no acute distress. Eyes: Pink conjunctiva, anicteric sclera. HEENT: Easily palpable left neck lymphadenopathy. Breast: Left chest wall without evidence of recurrence.  Right breast and axilla without lumps or masses. Exam deferred today. Lungs: Clear to auscultation bilaterally. Heart: Regular  rate and rhythm. No rubs, murmurs, or gallops. Abdomen: Soft, nontender, nondistended. No organomegaly noted, normoactive bowel sounds. Musculoskeletal: No edema, cyanosis, or clubbing. Neuro: Alert, answering all questions appropriately. Cranial nerves grossly intact. Skin: No rashes or petechiae noted. Psych: Normal affect.   LAB RESULTS:  Lab Results  Component Value Date   NA 137 12/06/2015   K 4.1 12/06/2015   CL 107 12/06/2015   CO2 27 12/06/2015   GLUCOSE 109 (H) 12/06/2015   BUN 23 (H) 12/06/2015   CREATININE 0.83 12/06/2015   CALCIUM 9.2 12/06/2015   PROT 7.0 12/06/2015   ALBUMIN 4.0 12/06/2015   AST 40 12/06/2015   ALT 25 12/06/2015   ALKPHOS 64 12/06/2015   BILITOT 0.4 12/06/2015   GFRNONAA >60 12/06/2015   GFRAA >60 12/06/2015    Lab Results  Component Value Date   WBC 8.7 12/06/2015   NEUTROABS 3.7 12/06/2015   HGB 11.6 (L) 12/06/2015   HCT 34.4 (L) 12/06/2015   MCV 90.1 12/06/2015   PLT 266 12/06/2015     STUDIES: No results found.  ASSESSMENT:  ER/PR positive, HER-2 negative  stage III inflammatory left breast carcinoma, unspecified location. Now with biopsy proven stage IV disease with lymph node and bone metastasis.  PLAN:    1.  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  Patient completed 5 years of Arimidex in June of 2015. Continue treatment on clinical trial "ACCRU" since she has a life expectancy of greater than 12 weeks. She will receive single agent Taxol on days 1, 8, and 15 with day 22 off. Will repeat imaging with CT scan and bone scan at the conclusion of her chemotherapy at which point will determine whether to place her back on aromatase inhibitor or continue with aggressive chemotherapy. Patient has now completed her XRT, therefore will reinitiate treatment with cycle 7. Return to clinic weekly for Taxol and then in 4 weeks for further evaluation. 2.  Osteopenia: Patient's bone mineral density from December 24, 2012 reported a T score of -1.2.  She has elected to disconitune Fosamax.  Continue calcium and vitamin D. 3. Anemia: Mild, monitor. 4. 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 5. Back pain: Resolved. Patient has now completed XRT. Continue hydrocodone as needed. 6. EKG: Proceed with routine EKGs as dictated per protocol. 7. Cough: Patient was given a prescription for Tussionex today. 8. Visual changes: Unclear if related to chemotherapy or not. Monitor.   Patient expressed understanding and was in agreement with this plan. She also understands that She can call clinic at any time with any questions, concerns, or complaints.   Breast cancer   Staging form: Breast, AJCC 7th Edition     Pathologic stage from 08/11/2014: Stage IIIA (T0, N2a, cM0) - Signed by Lloyd Huger, MD on 08/11/2014   Lloyd Huger, MD   12/06/2015 3:45 PM

## 2015-12-06 ENCOUNTER — Inpatient Hospital Stay: Payer: Managed Care, Other (non HMO)

## 2015-12-06 ENCOUNTER — Inpatient Hospital Stay (HOSPITAL_BASED_OUTPATIENT_CLINIC_OR_DEPARTMENT_OTHER): Payer: Managed Care, Other (non HMO) | Admitting: Oncology

## 2015-12-06 ENCOUNTER — Ambulatory Visit: Payer: Managed Care, Other (non HMO)

## 2015-12-06 VITALS — BP 124/82 | HR 89 | Temp 97.9°F | Resp 18 | Ht 64.17 in | Wt 203.0 lb

## 2015-12-06 DIAGNOSIS — C50912 Malignant neoplasm of unspecified site of left female breast: Secondary | ICD-10-CM | POA: Diagnosis not present

## 2015-12-06 DIAGNOSIS — D649 Anemia, unspecified: Secondary | ICD-10-CM

## 2015-12-06 DIAGNOSIS — C773 Secondary and unspecified malignant neoplasm of axilla and upper limb lymph nodes: Secondary | ICD-10-CM | POA: Diagnosis not present

## 2015-12-06 DIAGNOSIS — Z006 Encounter for examination for normal comparison and control in clinical research program: Secondary | ICD-10-CM | POA: Diagnosis not present

## 2015-12-06 DIAGNOSIS — R785 Finding of other psychotropic drug in blood: Secondary | ICD-10-CM

## 2015-12-06 DIAGNOSIS — Z7982 Long term (current) use of aspirin: Secondary | ICD-10-CM

## 2015-12-06 DIAGNOSIS — Z96611 Presence of right artificial shoulder joint: Secondary | ICD-10-CM

## 2015-12-06 DIAGNOSIS — C50011 Malignant neoplasm of nipple and areola, right female breast: Secondary | ICD-10-CM

## 2015-12-06 DIAGNOSIS — G893 Neoplasm related pain (acute) (chronic): Secondary | ICD-10-CM

## 2015-12-06 DIAGNOSIS — Z170 Estrogen receptor positive status [ER+]: Secondary | ICD-10-CM

## 2015-12-06 DIAGNOSIS — C7951 Secondary malignant neoplasm of bone: Secondary | ICD-10-CM

## 2015-12-06 DIAGNOSIS — F329 Major depressive disorder, single episode, unspecified: Secondary | ICD-10-CM

## 2015-12-06 DIAGNOSIS — T451X5S Adverse effect of antineoplastic and immunosuppressive drugs, sequela: Secondary | ICD-10-CM

## 2015-12-06 DIAGNOSIS — K219 Gastro-esophageal reflux disease without esophagitis: Secondary | ICD-10-CM

## 2015-12-06 DIAGNOSIS — M8580 Other specified disorders of bone density and structure, unspecified site: Secondary | ICD-10-CM

## 2015-12-06 DIAGNOSIS — M179 Osteoarthritis of knee, unspecified: Secondary | ICD-10-CM

## 2015-12-06 DIAGNOSIS — Z5111 Encounter for antineoplastic chemotherapy: Secondary | ICD-10-CM | POA: Diagnosis not present

## 2015-12-06 DIAGNOSIS — G620 Drug-induced polyneuropathy: Secondary | ICD-10-CM

## 2015-12-06 DIAGNOSIS — R05 Cough: Secondary | ICD-10-CM

## 2015-12-06 DIAGNOSIS — Z96652 Presence of left artificial knee joint: Secondary | ICD-10-CM

## 2015-12-06 DIAGNOSIS — Z79899 Other long term (current) drug therapy: Secondary | ICD-10-CM

## 2015-12-06 DIAGNOSIS — Z9012 Acquired absence of left breast and nipple: Secondary | ICD-10-CM

## 2015-12-06 DIAGNOSIS — R059 Cough, unspecified: Secondary | ICD-10-CM

## 2015-12-06 DIAGNOSIS — Z7984 Long term (current) use of oral hypoglycemic drugs: Secondary | ICD-10-CM

## 2015-12-06 DIAGNOSIS — C50919 Malignant neoplasm of unspecified site of unspecified female breast: Secondary | ICD-10-CM

## 2015-12-06 DIAGNOSIS — E119 Type 2 diabetes mellitus without complications: Secondary | ICD-10-CM

## 2015-12-06 DIAGNOSIS — I10 Essential (primary) hypertension: Secondary | ICD-10-CM

## 2015-12-06 LAB — CBC WITH DIFFERENTIAL/PLATELET
BASOS ABS: 0.1 10*3/uL (ref 0–0.1)
BASOS PCT: 1 %
EOS ABS: 2.8 10*3/uL — AB (ref 0–0.7)
EOS PCT: 33 %
HCT: 34.4 % — ABNORMAL LOW (ref 35.0–47.0)
HEMOGLOBIN: 11.6 g/dL — AB (ref 12.0–16.0)
Lymphocytes Relative: 12 %
Lymphs Abs: 1.1 10*3/uL (ref 1.0–3.6)
MCH: 30.5 pg (ref 26.0–34.0)
MCHC: 33.9 g/dL (ref 32.0–36.0)
MCV: 90.1 fL (ref 80.0–100.0)
Monocytes Absolute: 1 10*3/uL — ABNORMAL HIGH (ref 0.2–0.9)
Monocytes Relative: 11 %
NEUTROS PCT: 43 %
Neutro Abs: 3.7 10*3/uL (ref 1.4–6.5)
PLATELETS: 266 10*3/uL (ref 150–440)
RBC: 3.81 MIL/uL (ref 3.80–5.20)
RDW: 18.4 % — ABNORMAL HIGH (ref 11.5–14.5)
WBC: 8.7 10*3/uL (ref 3.6–11.0)

## 2015-12-06 LAB — COMPREHENSIVE METABOLIC PANEL
ALBUMIN: 4 g/dL (ref 3.5–5.0)
ALK PHOS: 64 U/L (ref 38–126)
ALT: 25 U/L (ref 14–54)
ANION GAP: 3 — AB (ref 5–15)
AST: 40 U/L (ref 15–41)
BUN: 23 mg/dL — ABNORMAL HIGH (ref 6–20)
CALCIUM: 9.2 mg/dL (ref 8.9–10.3)
CHLORIDE: 107 mmol/L (ref 101–111)
CO2: 27 mmol/L (ref 22–32)
CREATININE: 0.83 mg/dL (ref 0.44–1.00)
GFR calc Af Amer: >60 mL/min (ref >60)
GFR calc non Af Amer: >60 mL/min (ref >60)
GLUCOSE: 109 mg/dL — AB (ref 65–99)
Potassium: 4.1 mmol/L (ref 3.5–5.1)
SODIUM: 137 mmol/L (ref 135–145)
Total Bilirubin: 0.4 mg/dL (ref 0.3–1.2)
Total Protein: 7 g/dL (ref 6.5–8.1)

## 2015-12-06 MED ORDER — DEXAMETHASONE SODIUM PHOSPHATE 10 MG/ML IJ SOLN
10.0000 mg | Freq: Once | INTRAMUSCULAR | Status: DC
  Filled 2015-12-06: qty 1

## 2015-12-06 MED ORDER — GUAIFENESIN-DM 100-10 MG/5ML PO SYRP
5.0000 mL | ORAL_SOLUTION | Freq: Once | ORAL | Status: DC
  Filled 2015-12-06: qty 5

## 2015-12-06 MED ORDER — PACLITAXEL CHEMO INJECTION 300 MG/50ML
90.0000 mg/m2 | Freq: Once | INTRAVENOUS | Status: AC
  Administered 2015-12-06: 186 mg via INTRAVENOUS
  Filled 2015-12-06: qty 31

## 2015-12-06 MED ORDER — SODIUM CHLORIDE 0.9 % IV SOLN: 10.0000 mg | Freq: Once | INTRAVENOUS | Status: DC

## 2015-12-06 MED ORDER — SODIUM CHLORIDE 0.9 % IV SOLN
Freq: Once | INTRAVENOUS | Status: AC
  Administered 2015-12-06: 12:00:00 via INTRAVENOUS
  Filled 2015-12-06: qty 1000

## 2015-12-06 MED ORDER — HYDROCOD POLST-CPM POLST ER 10-8 MG/5ML PO SUER: 5.0000 mL | Freq: Two times a day (BID) | ORAL | 0 refills | Status: DC | PRN

## 2015-12-06 MED ORDER — FAMOTIDINE IN NACL 20-0.9 MG/50ML-% IV SOLN
20.0000 mg | Freq: Once | INTRAVENOUS | Status: AC
  Administered 2015-12-06: 20 mg via INTRAVENOUS
  Filled 2015-12-06: qty 50

## 2015-12-06 MED ORDER — SODIUM CHLORIDE 0.9% FLUSH
10.0000 mL | Freq: Once | INTRAVENOUS | Status: AC
  Administered 2015-12-06: 10 mL via INTRAVENOUS
  Filled 2015-12-06: qty 10

## 2015-12-06 MED ORDER — HEPARIN SOD (PORK) LOCK FLUSH 100 UNIT/ML IV SOLN
500.0000 [IU] | Freq: Once | INTRAVENOUS | Status: AC
  Administered 2015-12-06: 500 [IU] via INTRAVENOUS
  Filled 2015-12-06: qty 5

## 2015-12-06 MED ORDER — DIPHENHYDRAMINE HCL 50 MG/ML IJ SOLN
25.0000 mg | Freq: Once | INTRAMUSCULAR | Status: AC
  Administered 2015-12-06: 25 mg via INTRAVENOUS
  Filled 2015-12-06: qty 1

## 2015-12-06 MED ORDER — DEXAMETHASONE SODIUM PHOSPHATE 10 MG/ML IJ SOLN: 10.0000 mg | Freq: Once | INTRAMUSCULAR | Status: DC

## 2015-12-06 MED ORDER — DEXAMETHASONE SODIUM PHOSPHATE 10 MG/ML IJ SOLN
10.0000 mg | Freq: Once | INTRAMUSCULAR | Status: AC
  Administered 2015-12-06: 10 mg via INTRAVENOUS

## 2015-12-06 MED ORDER — HYDROCODONE-ACETAMINOPHEN 5-325 MG PO TABS: 1.0000 | ORAL_TABLET | Freq: Once | ORAL | Status: DC

## 2015-12-06 MED ORDER — HYDROCOD POLST-CPM POLST ER 10-8 MG/5ML PO SUER: 5.0000 mL | Freq: Once | ORAL | Status: DC

## 2015-12-06 NOTE — Progress Notes (Signed)
Complains of persistent dry cough.

## 2015-12-06 NOTE — Progress Notes (Signed)
The patient returns today accompanied by her husband for consideration of Cycle 3 Day 1 Taxol infusion per the protocol for ACCRU [id number] I. The patient will restart cycle 3 day 1 today following a hold in treatment for the past two weeks due to palliative radiation to the spine. Her final treatment of palliative radiation was on 12/02/2016. The patient states her pain was relieved by the radiation and she is feeling much better. Dr. Grayland Ormond in to examine and discuss today's plan for treatment. The patient reports a dry cough for the past week, she states "sometimes it wakes me up at night". She states she has tried Mucinex and Hall's cough drops without any improvement. Dr. Grayland Ormond recommends and prescribed Tussinex and to to try Robitussin during the day to avoid drowsiness. The patient also states she has noticed a decrease in vision which improved over the last couple of weeks but noticed some burry vision last week. Dr. Grayland Ormond will monitor the vision for now. The patient also reports that she continues to have some sinus headaches off and on. This nurse reviewed the PRO CTCAE phone survey with the patient. The patient completed the EORTC QLQ-CIPN20 Questionnaire in the office today. Labs were reviewed by Dr. Grayland Ormond and all were within acceptable parameters to proceed with Taxol infusion today. Future appointments were made and given to the patient. The appointment for CT with contrast of chest, abdomen and pelvis is scheduled for 12/21/2015 at [facility] and the patient was made aware to pick up contrast prior to appointment.  All attributions and grades are listed below: Mirian Mo, RN, BSN 12/06/2015 10:53 AM      Malachy Moan [id number]  12/06/2015  Adverse Event Log  Study/Protocol: ACCRU [id number] I Cycle: 3 Day 1    Restart  Event Grade Onset Date Resolved Date Drug Name Attribution Treatment Comments  Insomnia Grade 1 Prior to study ongoing Taxol Not related   Neurontin qhs Patient continues  to report sleeping 8-10 hours a night  Increase creatinine Grade 1 10/04/2015 12/06/2015 Taxol Possibly related None Encouraged to increase p.o. intake  Headaches Grade 1 10/28/2015 ongoing Taxol Not related OTC Acetaminophen Reports mild headache on 11/04/15 and 11/06/15.   Back pain (lower back) Grade 2 11/04/2015 12/06/2015 Taxol Unlikely Prescribed pain medication, referral for radiation consult Patient received palliative radiation from 11/22/2015-12/03/2015  Cough  Grade 2 11/29/2015 ongoing Taxol Not related Tussinex and OTC Robitussin   Decreased/blurred Vision Grade 1 11/29/2015 ongoing Taxol Unlikely Monitor for now   Mild Anemia Grade 1 10/25/2015 ongoing Taxol Likely None   Numbness in fingertips Grade 1 11/22/2015 12/06/2015 Taxol Related None Patient reports all numbness has resolved  Fatigue Grade 1 10/08/2015 ongoing Taxol Likely None    Mirian Mo, RN, BSN 12/06/2015 11:07 AM

## 2015-12-13 ENCOUNTER — Inpatient Hospital Stay: Payer: Managed Care, Other (non HMO)

## 2015-12-13 ENCOUNTER — Ambulatory Visit: Payer: Managed Care, Other (non HMO)

## 2015-12-13 VITALS — BP 108/73 | HR 84 | Temp 96.2°F | Resp 20

## 2015-12-13 DIAGNOSIS — C50912 Malignant neoplasm of unspecified site of left female breast: Secondary | ICD-10-CM

## 2015-12-13 DIAGNOSIS — Z170 Estrogen receptor positive status [ER+]: Principal | ICD-10-CM

## 2015-12-13 DIAGNOSIS — Z5111 Encounter for antineoplastic chemotherapy: Secondary | ICD-10-CM | POA: Diagnosis not present

## 2015-12-13 DIAGNOSIS — C50919 Malignant neoplasm of unspecified site of unspecified female breast: Secondary | ICD-10-CM

## 2015-12-13 LAB — CBC WITH DIFFERENTIAL/PLATELET
BASOS PCT: 0 %
Basophils Absolute: 0 10*3/uL (ref 0–0.1)
EOS ABS: 0.6 10*3/uL (ref 0–0.7)
Eosinophils Relative: 14 %
HEMATOCRIT: 34 % — AB (ref 35.0–47.0)
Hemoglobin: 11.4 g/dL — ABNORMAL LOW (ref 12.0–16.0)
Lymphocytes Relative: 17 %
Lymphs Abs: 0.7 10*3/uL — ABNORMAL LOW (ref 1.0–3.6)
MCH: 30.4 pg (ref 26.0–34.0)
MCHC: 33.5 g/dL (ref 32.0–36.0)
MCV: 90.9 fL (ref 80.0–100.0)
MONO ABS: 0.4 10*3/uL (ref 0.2–0.9)
MONOS PCT: 10 %
NEUTROS ABS: 2.4 10*3/uL (ref 1.4–6.5)
Neutrophils Relative %: 59 %
Platelets: 237 10*3/uL (ref 150–440)
RBC: 3.75 MIL/uL — ABNORMAL LOW (ref 3.80–5.20)
RDW: 17.7 % — AB (ref 11.5–14.5)
WBC: 4.1 10*3/uL (ref 3.6–11.0)

## 2015-12-13 LAB — COMPREHENSIVE METABOLIC PANEL
ALBUMIN: 3.8 g/dL (ref 3.5–5.0)
ALT: 39 U/L (ref 14–54)
ANION GAP: 11 (ref 5–15)
AST: 57 U/L — ABNORMAL HIGH (ref 15–41)
Alkaline Phosphatase: 55 U/L (ref 38–126)
BILIRUBIN TOTAL: 0.4 mg/dL (ref 0.3–1.2)
BUN: 20 mg/dL (ref 6–20)
CO2: 26 mmol/L (ref 22–32)
Calcium: 9.2 mg/dL (ref 8.9–10.3)
Chloride: 100 mmol/L — ABNORMAL LOW (ref 101–111)
Creatinine, Ser: 1.02 mg/dL — ABNORMAL HIGH (ref 0.44–1.00)
GFR, EST NON AFRICAN AMERICAN: 57 mL/min — AB (ref >60)
GLUCOSE: 155 mg/dL — AB (ref 65–99)
POTASSIUM: 3.8 mmol/L (ref 3.5–5.1)
Sodium: 137 mmol/L (ref 135–145)
TOTAL PROTEIN: 6.6 g/dL (ref 6.5–8.1)

## 2015-12-13 MED ORDER — FAMOTIDINE IN NACL 20-0.9 MG/50ML-% IV SOLN
20.0000 mg | Freq: Once | INTRAVENOUS | Status: AC
  Administered 2015-12-13: 20 mg via INTRAVENOUS
  Filled 2015-12-13: qty 50

## 2015-12-13 MED ORDER — SODIUM CHLORIDE 0.9% FLUSH
10.0000 mL | INTRAVENOUS | Status: DC | PRN
  Administered 2015-12-13: 10 mL
  Filled 2015-12-13: qty 10

## 2015-12-13 MED ORDER — DEXAMETHASONE SODIUM PHOSPHATE 10 MG/ML IJ SOLN
10.0000 mg | Freq: Once | INTRAMUSCULAR | Status: AC
  Administered 2015-12-13: 10 mg via INTRAVENOUS
  Filled 2015-12-13: qty 1

## 2015-12-13 MED ORDER — DIPHENHYDRAMINE HCL 50 MG/ML IJ SOLN
25.0000 mg | Freq: Once | INTRAMUSCULAR | Status: AC
  Administered 2015-12-13: 25 mg via INTRAVENOUS
  Filled 2015-12-13: qty 1

## 2015-12-13 MED ORDER — SODIUM CHLORIDE 0.9 % IV SOLN: 10.0000 mg | Freq: Once | INTRAVENOUS | Status: DC

## 2015-12-13 MED ORDER — PACLITAXEL CHEMO INJECTION 300 MG/50ML
90.0000 mg/m2 | Freq: Once | INTRAVENOUS | Status: AC
  Administered 2015-12-13: 186 mg via INTRAVENOUS
  Filled 2015-12-13: qty 31

## 2015-12-13 MED ORDER — HEPARIN SOD (PORK) LOCK FLUSH 100 UNIT/ML IV SOLN
500.0000 [IU] | Freq: Once | INTRAVENOUS | Status: AC | PRN
  Administered 2015-12-13: 500 [IU]
  Filled 2015-12-13: qty 5

## 2015-12-13 MED ORDER — INFLUENZA VAC SPLIT QUAD 0.5 ML IM SUSY
0.5000 mL | PREFILLED_SYRINGE | Freq: Once | INTRAMUSCULAR | Status: AC
  Administered 2015-12-13: 0.5 mL via INTRAMUSCULAR
  Filled 2015-12-13: qty 0.5

## 2015-12-13 MED ORDER — SODIUM CHLORIDE 0.9 % IV SOLN
Freq: Once | INTRAVENOUS | Status: AC
  Administered 2015-12-13: 11:00:00 via INTRAVENOUS
  Filled 2015-12-13: qty 1000

## 2015-12-13 NOTE — Progress Notes (Signed)
The patient returns today accompanied by her daughter  for consideration of cycle 3 day 8 of Taxol infusion per the protocol for ACCRU [id number] I. The patient reports numbness in her toes and fingers which she states started on Saturday December 11, 2015. She reports that her fingers feel different and "not right".  Discussed with Dr. Grayland Ormond and we will monitor for now. The patient agrees to notify this nurse if numbness and tingling increase.  The patient also reports that her appetite has decreased and that she was only able to eat small portions over Thanksgiving. Denies nausea or vomiting. Reviewed current medications, she is taking Tussinex for cough as needed and she reports it has improved her cough. The patient asked if she could receive a flu shot today. Dr. Grayland Ormond approved flu shot and it was administered today.  Labs were reviewed with Dr. Grayland Ormond and all were within acceptable parameters to proceed with Taxol today. The PRO CTCAE phone survey was reviewed with the patient. Confirmed future  appointments with the patient including the CT and Bone Scan scheduled for December 21, 2015. All grades and attributions are listed below:  Amanda Mo, RN, BSN 12/13/2015 11:14 AM   Malachy Moan [id number]  12/13/2015  Adverse Event Log  Study/Protocol: ACCRU [id number] I Cycle: 3 Day 8     Event Grade Onset Date Resolved Date Drug Name Attribution Treatment Comments  Insomnia Grade 1 Prior to study ongoing Taxol Not related   Neurontin qhs Patient continues to report sleeping 8-10 hours a night  Increased creatinine Grade 1 12/13/2015 ongoing Taxol Possibly related None Encouraged to increase p.o. intake  Headaches Grade 1 10/28/2015 ongoing Taxol Not related OTC Acetaminophen Reports mild headache on 11/04/15 and 11/06/15.   Cough  Grade 2 11/29/2015 ongoing Taxol Not related Tussinex and OTC Robitussin Pt. reports improvement  Decreased/blurred Vision Grade 1 11/29/2015 ongoing Taxol Possibly  related Monitor for now No change since last visit 12/06/2015  Mild Anemia Grade 1 10/25/2015 ongoing Taxol Likely None Monitor  Fatigue Grade 1 10/08/2015 ongoing Taxol Likely None Monitor  Numbness in toes and fingers Toes>fingers Grade 1 12/11/2015 ongoing Taxol Probably Related None Monitor   Decreased Appetite Grade 1 12/09/2015 ongoing Taxol Possibly related None Monitor  Lymphocytopenia Grade 1 12/13/2015 ongoing Taxol Probably related None Monitor   Amanda Mo, RN, BSN 12/13/2015 11:30 AM

## 2015-12-20 ENCOUNTER — Inpatient Hospital Stay: Payer: Managed Care, Other (non HMO) | Attending: Oncology

## 2015-12-20 ENCOUNTER — Telehealth: Payer: Self-pay

## 2015-12-20 ENCOUNTER — Inpatient Hospital Stay: Payer: Managed Care, Other (non HMO)

## 2015-12-20 VITALS — BP 110/70 | HR 85 | Temp 97.1°F | Resp 18

## 2015-12-20 DIAGNOSIS — Z79899 Other long term (current) drug therapy: Secondary | ICD-10-CM | POA: Diagnosis not present

## 2015-12-20 DIAGNOSIS — Z9012 Acquired absence of left breast and nipple: Secondary | ICD-10-CM | POA: Insufficient documentation

## 2015-12-20 DIAGNOSIS — E119 Type 2 diabetes mellitus without complications: Secondary | ICD-10-CM | POA: Diagnosis not present

## 2015-12-20 DIAGNOSIS — C50919 Malignant neoplasm of unspecified site of unspecified female breast: Secondary | ICD-10-CM

## 2015-12-20 DIAGNOSIS — M79606 Pain in leg, unspecified: Secondary | ICD-10-CM | POA: Diagnosis not present

## 2015-12-20 DIAGNOSIS — C773 Secondary and unspecified malignant neoplasm of axilla and upper limb lymph nodes: Secondary | ICD-10-CM | POA: Insufficient documentation

## 2015-12-20 DIAGNOSIS — Z006 Encounter for examination for normal comparison and control in clinical research program: Secondary | ICD-10-CM | POA: Diagnosis not present

## 2015-12-20 DIAGNOSIS — K219 Gastro-esophageal reflux disease without esophagitis: Secondary | ICD-10-CM | POA: Insufficient documentation

## 2015-12-20 DIAGNOSIS — C50912 Malignant neoplasm of unspecified site of left female breast: Secondary | ICD-10-CM | POA: Diagnosis not present

## 2015-12-20 DIAGNOSIS — Z5111 Encounter for antineoplastic chemotherapy: Secondary | ICD-10-CM | POA: Diagnosis present

## 2015-12-20 DIAGNOSIS — R413 Other amnesia: Secondary | ICD-10-CM | POA: Diagnosis not present

## 2015-12-20 DIAGNOSIS — I10 Essential (primary) hypertension: Secondary | ICD-10-CM | POA: Insufficient documentation

## 2015-12-20 DIAGNOSIS — M8580 Other specified disorders of bone density and structure, unspecified site: Secondary | ICD-10-CM | POA: Diagnosis not present

## 2015-12-20 DIAGNOSIS — Z7982 Long term (current) use of aspirin: Secondary | ICD-10-CM | POA: Insufficient documentation

## 2015-12-20 DIAGNOSIS — Z170 Estrogen receptor positive status [ER+]: Secondary | ICD-10-CM | POA: Insufficient documentation

## 2015-12-20 DIAGNOSIS — R05 Cough: Secondary | ICD-10-CM | POA: Diagnosis not present

## 2015-12-20 DIAGNOSIS — M179 Osteoarthritis of knee, unspecified: Secondary | ICD-10-CM | POA: Diagnosis not present

## 2015-12-20 DIAGNOSIS — E785 Hyperlipidemia, unspecified: Secondary | ICD-10-CM | POA: Insufficient documentation

## 2015-12-20 DIAGNOSIS — F329 Major depressive disorder, single episode, unspecified: Secondary | ICD-10-CM | POA: Insufficient documentation

## 2015-12-20 DIAGNOSIS — C7951 Secondary malignant neoplasm of bone: Secondary | ICD-10-CM | POA: Insufficient documentation

## 2015-12-20 DIAGNOSIS — G4700 Insomnia, unspecified: Secondary | ICD-10-CM | POA: Diagnosis not present

## 2015-12-20 DIAGNOSIS — D649 Anemia, unspecified: Secondary | ICD-10-CM | POA: Diagnosis not present

## 2015-12-20 DIAGNOSIS — Z7984 Long term (current) use of oral hypoglycemic drugs: Secondary | ICD-10-CM | POA: Diagnosis not present

## 2015-12-20 LAB — COMPREHENSIVE METABOLIC PANEL
ALBUMIN: 3.7 g/dL (ref 3.5–5.0)
ALT: 32 U/L (ref 14–54)
ANION GAP: 9 (ref 5–15)
AST: 37 U/L (ref 15–41)
Alkaline Phosphatase: 51 U/L (ref 38–126)
BILIRUBIN TOTAL: 0.5 mg/dL (ref 0.3–1.2)
BUN: 26 mg/dL — ABNORMAL HIGH (ref 6–20)
CO2: 27 mmol/L (ref 22–32)
Calcium: 8.7 mg/dL — ABNORMAL LOW (ref 8.9–10.3)
Chloride: 101 mmol/L (ref 101–111)
Creatinine, Ser: 0.86 mg/dL (ref 0.44–1.00)
Glucose, Bld: 160 mg/dL — ABNORMAL HIGH (ref 65–99)
POTASSIUM: 3.4 mmol/L — AB (ref 3.5–5.1)
Sodium: 137 mmol/L (ref 135–145)
TOTAL PROTEIN: 6.5 g/dL (ref 6.5–8.1)

## 2015-12-20 LAB — CBC WITH DIFFERENTIAL/PLATELET
BASOS PCT: 1 %
Basophils Absolute: 0 10*3/uL (ref 0–0.1)
Eosinophils Absolute: 0.4 10*3/uL (ref 0–0.7)
Eosinophils Relative: 16 %
HEMATOCRIT: 32.8 % — AB (ref 35.0–47.0)
Hemoglobin: 11.1 g/dL — ABNORMAL LOW (ref 12.0–16.0)
Lymphocytes Relative: 24 %
Lymphs Abs: 0.6 10*3/uL — ABNORMAL LOW (ref 1.0–3.6)
MCH: 31 pg (ref 26.0–34.0)
MCHC: 33.8 g/dL (ref 32.0–36.0)
MCV: 91.6 fL (ref 80.0–100.0)
MONO ABS: 0.3 10*3/uL (ref 0.2–0.9)
MONOS PCT: 13 %
NEUTROS ABS: 1.1 10*3/uL — AB (ref 1.4–6.5)
Neutrophils Relative %: 46 %
Platelets: 271 10*3/uL (ref 150–440)
RBC: 3.58 MIL/uL — ABNORMAL LOW (ref 3.80–5.20)
RDW: 17.4 % — AB (ref 11.5–14.5)
WBC: 2.5 10*3/uL — ABNORMAL LOW (ref 3.6–11.0)

## 2015-12-20 MED ORDER — HEPARIN SOD (PORK) LOCK FLUSH 100 UNIT/ML IV SOLN
500.0000 [IU] | Freq: Once | INTRAVENOUS | Status: AC | PRN
  Administered 2015-12-20: 500 [IU]
  Filled 2015-12-20: qty 5

## 2015-12-20 MED ORDER — SODIUM CHLORIDE 0.9 % IV SOLN
10.0000 mg | Freq: Once | INTRAVENOUS | Status: AC
  Administered 2015-12-20: 10 mg via INTRAVENOUS
  Filled 2015-12-20: qty 1

## 2015-12-20 MED ORDER — SODIUM CHLORIDE 0.9 % IV SOLN
Freq: Once | INTRAVENOUS | Status: AC
  Administered 2015-12-20: 09:00:00 via INTRAVENOUS
  Filled 2015-12-20: qty 1000

## 2015-12-20 MED ORDER — DIPHENHYDRAMINE HCL 50 MG/ML IJ SOLN
25.0000 mg | Freq: Once | INTRAMUSCULAR | Status: AC
  Administered 2015-12-20: 25 mg via INTRAVENOUS
  Filled 2015-12-20: qty 1

## 2015-12-20 MED ORDER — SODIUM CHLORIDE 0.9% FLUSH
10.0000 mL | INTRAVENOUS | Status: DC | PRN
  Administered 2015-12-20: 10 mL
  Filled 2015-12-20: qty 10

## 2015-12-20 MED ORDER — FAMOTIDINE IN NACL 20-0.9 MG/50ML-% IV SOLN
20.0000 mg | Freq: Once | INTRAVENOUS | Status: AC
  Administered 2015-12-20: 20 mg via INTRAVENOUS
  Filled 2015-12-20: qty 50

## 2015-12-20 MED ORDER — SODIUM CHLORIDE 0.9 % IV SOLN
90.0000 mg/m2 | Freq: Once | INTRAVENOUS | Status: AC
  Administered 2015-12-20: 186 mg via INTRAVENOUS
  Filled 2015-12-20: qty 31

## 2015-12-20 NOTE — Progress Notes (Signed)
Here for chemo today. ANC 1.1 today. MD and research state okay to proceed with chemo today.

## 2015-12-20 NOTE — Progress Notes (Signed)
Patient returned phone call. Patient was given new date and time for CT and bone scans. Rescheduled both scans from Tuesday 12/21/2015 to Monday 12/27/2015 at 9:00am. Patient verbalized understanding and read back instructions.  Mirian Mo, RN, BSN 12/20/2015 3:19 PM

## 2015-12-20 NOTE — Telephone Encounter (Signed)
Pt requests MD ( Dr Grayland Ormond ) call her daughter who was added to her HIPPA list . Per pt daughter in town visiting pt-can be reached at [phone]  Or after tomorrow at daughters cell- [phone]

## 2015-12-20 NOTE — Progress Notes (Signed)
The patient returns to clinic today accompanied by her daughter  for consideration of Cycle 3 Day 15 for Taxol infusion per the protocol for ACCRU [id number] I. Central labs were collected today. The patient continues to report numbness in her toes R>L. She states, "its mostly in my right toes and all  5 of them". Reported to Dr. Grayland Ormond and he recommends to monitor for now and  re-evaluate at next visit, will consider reducing Taxol next cycle if neuropathy is worse. The patient also reports back pain in her mid back region on Wednesday. She stated her pain was a 6 out of 10 on the pain scale and that she took pain medication which relieved her symptoms. The pain has not returned as of today. The patient reports that she continues to have a cough that comes and goes. She states are worse than others. The patient denies taking Robitussin during the day, this nurse recommended she take Robitussin as needed during the day and to continue the Tussinex at night as needed per Dr. Gary Fleet recommendation. The patient agreed to this plan.  The patient's daughter was concerned about her mother's treatments and how long they would last. The daughter also expressed concern over her mother's mental status. This nurse spoke with her at length and explained the plan of care at this point, with the patient's permission. The daughter verbalized understanding, contact information was given to the daughter with the patient's permission. The PRO CTCAE phone survey was reviewed with the patient. All labs were reviewed with Dr. Grayland Ormond. ANC= 1.1 and platelets= 271,000. All within acceptable parameters to proceed with Taxol today.  The patient is scheduled for CT of chest, abdomen and pelvis and Bone Scan tomorrow 12/27/2015. All appointments were given to the patient. She will return to clinic on January 03, 2016 to see MD, have labs and Taxol infusion. All AE's and attributions are listed below: Mirian Mo, RN, BSN 12/20/2015 10:25  AM   3:45pm received phone call from patient requesting clarification on current diagnosis and plan of care. Informed Dr. Grayland Ormond. He will review scans and contact patient.  Mirian Mo, RN, BSN 12/20/2015  3:50 PM    Malachy Moan [id number]  12/20/2015  Adverse Event Log  Study/Protocol: ACCRU [id number] I Cycle: 3 Day 15     Event Grade Onset Date Resolved Date Drug Name Attribution Treatment Comments  Insomnia Grade 1 Prior to study ongoing Taxol Not related   Neurontin qhs Patient continues to report sleeping 8-10 hours a night  Increased creatinine Grade 1 12/13/2015 12/20/2015 Taxol Possibly related None Encouraged to increase p.o. intake  Headaches Grade 1 10/28/2015 ongoing Taxol Not related OTC Acetaminophen Reports mild headache on 11/04/15 and 11/06/15.   Cough  Grade 2 11/29/2015 ongoing Taxol Not related Tussinex and OTC Robitussin Pt. reports improvement  Decreased/blurred Vision Grade 1 11/29/2015 ongoing Taxol Possibly related Monitor for now No change since last visit 12/06/2015  Mild Anemia Grade 1 10/25/2015 ongoing Taxol Probably related None Monitor  Fatigue Grade 1 10/08/2015 ongoing Taxol Probably related None Monitor  Numbness in toes and fingers Toes>fingers Grade 1 12/11/2015 ongoing Taxol Probably Related None Monitor, patient reports only toes are numb R>L  12/20/15    Decreased Appetite Grade 1 12/09/2015 ongoing Taxol Possibly related None Monitor  Decreased Neutrophil count Grade 2 12/20/2015 ongoing Taxol Probably related None Monitor  Hypocalcemia Grade 1 12/20/2015 ongoing Taxol Probably related None Monitor  White blood cell decreased Grade 2 12/20/2015 ongoing Taxol Probably related None  Monitor  Mild hypokalemia Grade 1 12/20/2015 ongoing Taxol Probably related None Moniitor  Back Pain (mid back) Grade 2 12/15/2015 12/15/2015 Taxol Not related Pain medication Pt. reports relief of symptom with pain medication  Lymphocytopenia Grade 1 12/13/2015 ongoing Taxol  Probably related None Monitor   Mirian Mo, RN, BSN 12/20/2015 11:07 AM

## 2015-12-20 NOTE — Telephone Encounter (Signed)
Pt called requesting to add daughter to her HIPPA form.

## 2015-12-20 NOTE — Progress Notes (Signed)
ANC 1.1.  MD ok to proceed with treatment

## 2015-12-21 ENCOUNTER — Ambulatory Visit: Admission: RE | Admit: 2015-12-21 | Payer: Managed Care, Other (non HMO) | Source: Ambulatory Visit

## 2015-12-25 ENCOUNTER — Other Ambulatory Visit: Payer: Self-pay | Admitting: Oncology

## 2015-12-25 DIAGNOSIS — C50919 Malignant neoplasm of unspecified site of unspecified female breast: Secondary | ICD-10-CM

## 2015-12-25 DIAGNOSIS — Z170 Estrogen receptor positive status [ER+]: Principal | ICD-10-CM

## 2015-12-25 MED ORDER — DOXYCYCLINE HYCLATE 100 MG PO TBEC: 100.0000 mg | DELAYED_RELEASE_TABLET | Freq: Two times a day (BID) | ORAL | 0 refills | Status: DC

## 2015-12-25 MED ORDER — DOXYCYCLINE HYCLATE 100 MG PO TABS: 100.0000 mg | ORAL_TABLET | Freq: Two times a day (BID) | ORAL | Status: DC

## 2015-12-27 ENCOUNTER — Encounter
Admission: RE | Admit: 2015-12-27 | Discharge: 2015-12-27 | Disposition: A | Discharge status: Home / Self Care | Payer: Managed Care, Other (non HMO) | Source: Ambulatory Visit | Attending: Oncology | Admitting: Oncology

## 2015-12-27 ENCOUNTER — Ambulatory Visit
Admission: RE | Admit: 2015-12-27 | Discharge: 2015-12-27 | Disposition: A | Discharge status: Home / Self Care | Payer: Managed Care, Other (non HMO) | Source: Ambulatory Visit | Attending: Oncology | Admitting: Oncology

## 2015-12-27 DIAGNOSIS — C7951 Secondary malignant neoplasm of bone: Secondary | ICD-10-CM | POA: Insufficient documentation

## 2015-12-27 DIAGNOSIS — Z9889 Other specified postprocedural states: Secondary | ICD-10-CM | POA: Diagnosis not present

## 2015-12-27 DIAGNOSIS — Z9012 Acquired absence of left breast and nipple: Secondary | ICD-10-CM | POA: Diagnosis not present

## 2015-12-27 DIAGNOSIS — Y842 Radiological procedure and radiotherapy as the cause of abnormal reaction of the patient, or of later complication, without mention of misadventure at the time of the procedure: Secondary | ICD-10-CM | POA: Diagnosis not present

## 2015-12-27 DIAGNOSIS — C50919 Malignant neoplasm of unspecified site of unspecified female breast: Secondary | ICD-10-CM

## 2015-12-27 MED ORDER — IOPAMIDOL (ISOVUE-300) INJECTION 61%
100.0000 mL | Freq: Once | INTRAVENOUS | Status: AC | PRN
  Administered 2015-12-27: 100 mL via INTRAVENOUS

## 2015-12-27 MED ORDER — TECHNETIUM TC 99M MEDRONATE IV KIT
22.0000 | PACK | Freq: Once | INTRAVENOUS | Status: AC | PRN
  Administered 2015-12-27: 19.885 via INTRAVENOUS

## 2015-12-28 ENCOUNTER — Telehealth: Payer: Self-pay

## 2015-12-28 ENCOUNTER — Other Ambulatory Visit: Payer: Self-pay | Admitting: Oncology

## 2015-12-28 NOTE — Telephone Encounter (Signed)
Patient called inquiring about CT and Bone Scan results. Informed patient Dr. Grayland Ormond will review results first and contact patient.  Mirian Mo, RN, BSN 12/28/2015 10:39 AM

## 2015-12-31 ENCOUNTER — Other Ambulatory Visit: Payer: Self-pay | Admitting: *Deleted

## 2015-12-31 DIAGNOSIS — C50919 Malignant neoplasm of unspecified site of unspecified female breast: Secondary | ICD-10-CM

## 2016-01-02 NOTE — Progress Notes (Signed)
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 Now with biopsy-proven stage IV disease.  INTERVAL HISTORY: Patient returns to clinic today for further evaluation and discussion of her imaging results. She continues to have neuropathy in her fingertips and feet. She also complains of occasional bilateral leg pain extending from her buttocks down to her foot. This pain is intermittent and seems to be positional. Her husband also describes episodes where patient does not remember anything of a conversation. She has noted some visual changes since starting chemotherapy. She also has a persistent dry cough. She otherwise feels well and is asymptomatic. She has no other neurologic complaints. She denies any recent fevers or illnesses.  She denies any chest pain or shortness of breath.  She denies any nausea, vomiting, constipation, or diarrhea.  She has no urinary complaints.  Patient offers no specific complaints today.  REVIEW OF SYSTEMS:   Review of Systems  Constitutional: Negative.  Negative for fever, malaise/fatigue and weight loss.  Eyes: Positive for blurred vision.  Respiratory: Positive for cough. Negative for shortness of breath.   Cardiovascular: Negative.  Negative for chest pain.  Gastrointestinal: Negative.  Negative for abdominal pain.  Genitourinary: Negative.   Musculoskeletal: Positive for back pain.  Neurological: Positive for sensory change. Negative for weakness.  Endo/Heme/Allergies: Negative.   Psychiatric/Behavioral: Positive for memory loss. The patient is not nervous/anxious.     As per HPI. Otherwise, a complete review of  systems is negative.  PAST MEDICAL HISTORY: Past Medical History:  Diagnosis Date  . Back pain    occasionally  . Breast cancer ([place])    left  . Depression    takes Paxil and Wellbutrin daily  . Diabetes ([place])    takes Metformin daily  . GERD (gastroesophageal reflux disease)   . History of bronchitis 2 yrs ago  . Hyperlipidemia    takes Atorvastatin daily  . Hypertension    takes Losartan-HCTZ daily  . Insomnia    takes Ambien nightly  . Insomnia    takes gabapentin nightly  . Joint pain   . Migraine   . Mood swings ([place])   . Osteoarthritis of knee   . PONV (postoperative nausea and vomiting)   . Seasonal allergies    takes Allegra daily    PAST SURGICAL HISTORY: Past Surgical History:  Procedure Laterality Date  . BREAST BIOPSY  2009  . cataract surgery Bilateral   . COLONOSCOPY    . JOINT REPLACEMENT Left 2014   knee  . KNEE ARTHROSCOPY Left    x 5  . MASTECTOMY Left   . port a cath placed    . PORTACATH PLACEMENT N/A 09/17/2015   Procedure: INSERTION PORT-A-CATH;  Surgeon: Jules Husbands, MD;  Location: [facility];  Service: General;  Laterality: N/A;  . TOTAL SHOULDER ARTHROPLASTY Right 06/03/2015   Procedure: TOTAL SHOULDER ARTHROPLASTY;  Surgeon: Tania Ade, MD;  Location: [facility];  Service: Orthopedics;  Laterality: Right;  Right total shoulder arthroplasty  . TOTAL SHOULDER REPLACEMENT Right 06/03/2015  . TUBAL LIGATION      FAMILY HISTORY: Father with non-Hodgkin's lymphoma, 2 paternal aunts with breast cancer.     ADVANCED DIRECTIVES:    HEALTH  MAINTENANCE: Social History  Substance Use Topics  . Smoking status: Never Smoker  . Smokeless tobacco: Never Used  . Alcohol use 0.0 oz/week     Comment: occasionally wine     Colonoscopy:  PAP:  Bone density:  Lipid panel:  Allergies  Allergen Reactions  . Ace Inhibitors Cough  . Latex Itching  . Morphine And Related Itching    Caused her to itch terribly. Would prefer if given to take with  a benadryl  . Other Itching    Freeze spray. Patient stated that she may be able to use it now because she doesn't use it as often.  Marland Kitchen Penicillins Rash    Has patient had a PCN reaction causing immediate rash, facial/tongue/throat swelling, SOB or lightheadedness with hypotension: No Has patient had a PCN reaction causing severe rash involving mucus membranes or skin necrosis: No Has patient had a PCN reaction that required hospitalization No Has patient had a PCN reaction occurring within the last 10 years: No If all of the above answers are "NO", then may proceed with Cephalosporin use.    Current Outpatient Prescriptions  Medication Sig Dispense Refill  . aspirin EC 81 MG tablet Take 81 mg by mouth daily.     Marland Kitchen atorvastatin (LIPITOR) 10 MG tablet Take 10 mg by mouth daily at 6 PM.     . b complex vitamins capsule Take 1 capsule by mouth daily.    Marland Kitchen buPROPion (WELLBUTRIN XL) 150 MG 24 hr tablet Take 150 mg by mouth every morning.     . Calcium Carbonate-Vit D-Min (CALCIUM 600+D PLUS MINERALS) 600-400 MG-UNIT TABS Take 1 tablet by mouth 2 (two) times daily.     . chlorpheniramine-HYDROcodone (TUSSIONEX) 10-8 MG/5ML SUER Take 5 mLs by mouth every 12 (twelve) hours as needed for cough. 140 mL 0  . fexofenadine (ALLEGRA) 180 MG tablet Take 180 mg by mouth at bedtime.     . gabapentin (NEURONTIN) 300 MG capsule Take 300 mg by mouth at bedtime.  1  . HYDROcodone-acetaminophen (NORCO/VICODIN) 5-325 MG tablet Take 1-2 tablets by mouth every 6 (six) hours as needed for moderate pain. 30 tablet 0  . lidocaine-prilocaine (EMLA) cream Apply 1 application topically as needed. Apply to port 1-2 hours prior to chemotherapy appointment. Cover with plastic wrap. 30 g 0  . losartan-hydrochlorothiazide (HYZAAR) 100-25 MG per tablet Take 1 tablet by mouth every morning.     . metFORMIN (GLUCOPHAGE) 850 MG tablet Take 850 mg by mouth 2 (two) times daily with a meal.     . PARoxetine (PAXIL) 40 MG tablet Take  40 mg by mouth every morning.     . traMADol (ULTRAM) 50 MG tablet Take 1 tablet (50 mg total) by mouth 2 (two) times daily. 60 tablet 0  . Zolpidem Tartrate (AMBIEN PO) Take by mouth.    . doxycycline (VIBRA-TABS) 100 MG tablet TAKE 1 TABLET (100 MG TOTAL) BY MOUTH 2 (TWO) TIMES DAILY.  0   Current Facility-Administered Medications  Medication Dose Route Frequency Provider Last Rate Last Dose  . guaiFENesin-dextromethorphan (ROBITUSSIN DM) 100-10 MG/5ML syrup 5 mL  5 mL Oral Once Lloyd Huger, MD        OBJECTIVE: Vitals:   01/03/16 0900  BP: 110/74  Pulse: 88  Resp: 18  Temp: 97.3 F (36.3 C)     Body mass index is 35.32 kg/m.    ECOG FS:0 - Asymptomatic  General: Well-developed, well-nourished, no acute distress. Eyes: Pink conjunctiva, anicteric  sclera. HEENT: Easily palpable left neck lymphadenopathy. Breast: Left chest wall without evidence of recurrence.  Right breast and axilla without lumps or masses. Exam deferred today. Lungs: Clear to auscultation bilaterally. Heart: Regular rate and rhythm. No rubs, murmurs, or gallops. Abdomen: Soft, nontender, nondistended. No organomegaly noted, normoactive bowel sounds. Musculoskeletal: No edema, cyanosis, or clubbing. Neuro: Alert, answering all questions appropriately. Cranial nerves grossly intact. Skin: No rashes or petechiae noted. Psych: Normal affect.   LAB RESULTS:  Lab Results  Component Value Date   NA 137 01/03/2016   K 4.1 01/03/2016   CL 105 01/03/2016   CO2 24 01/03/2016   GLUCOSE 139 (H) 01/03/2016   BUN 21 (H) 01/03/2016   CREATININE 0.84 01/03/2016   CALCIUM 8.4 (L) 01/03/2016   PROT 6.1 (L) 01/03/2016   ALBUMIN 3.5 01/03/2016   AST 31 01/03/2016   ALT 23 01/03/2016   ALKPHOS 56 01/03/2016   BILITOT 0.5 01/03/2016   GFRNONAA >60 01/03/2016   GFRAA >60 01/03/2016    Lab Results  Component Value Date   WBC 2.7 (L) 01/03/2016   NEUTROABS 0.9 (L) 01/03/2016   HGB 9.9 (L) 01/03/2016   HCT  29.4 (L) 01/03/2016   MCV 93.0 01/03/2016   PLT 295 01/03/2016     STUDIES: Ct Chest W Contrast  Result Date: 12/27/2015 CLINICAL DATA:  Inflammatory left breast cancer, status post left mastectomy, on chemotherapy EXAM: CT CHEST, ABDOMEN, AND PELVIS WITH CONTRAST TECHNIQUE: Multidetector CT imaging of the chest, abdomen and pelvis was performed following the standard protocol during bolus administration of intravenous contrast. CONTRAST:  13m ISOVUE-300 IOPAMIDOL (ISOVUE-300) INJECTION 61% COMPARISON:  PET-CT dated 09/08/2015 FINDINGS: RECIST 1.1 Target Lesions: 1. None Non-target Lesions: 1. 5 mm short axis subcarinal node (series 2/ image 22), previously 11 mm 2. Sclerotic osseous metastases in the visualized thoracolumbar spine and left pelvis CT CHEST FINDINGS Cardiovascular: The heart is normal in size. Trace pericardial fluid anteriorly. Coronary atherosclerosis in the LAD. Mild atherosclerotic calcifications aortic arch. Right chest port terminates at the cavoatrial junction. Mediastinum/Nodes: 5 mm short axis subcarinal node (series 2/ image 22), previously 11 mm. Prior left supraclavicular/lower cervical node is not included on the current CT. Visualized thyroid is unremarkable. Lungs/Pleura: Radiation changes in the anterior left upper lobe. Mild patchy/ground-glass opacity in the right lower lobe. No focal consolidation. No suspicious pulmonary nodules. No pleural effusion or pneumothorax. Musculoskeletal: Status post left mastectomy. Postsurgical changes/seroma in the left chest wall/axilla (series 2/images 19 and 24). CT ABDOMEN PELVIS FINDINGS Hepatobiliary: Liver is within normal limits. No suspicious/enhancing hepatic lesions. Gallbladder is unremarkable. No intrahepatic or extrahepatic ductal dilatation. Pancreas: Within normal limits. Spleen: Within normal limits. Adrenals/Urinary Tract: Adrenal glands are within normal limits. Kidneys are within normal limits. Left extrarenal pelvis.  No hydronephrosis. Bladder is mildly thick-walled although underdistended. Stomach/Bowel: Stomach is within normal limits. No evidence of bowel obstruction. Mild to moderate stool in the rectum. Mild sigmoid diverticulosis, without evidence of diverticulitis. Vascular/Lymphatic: No evidence of abdominal aortic aneurysm. Atherosclerotic calcifications of the abdominal aorta and branch vessels. No suspicious abdominopelvic lymphadenopathy. Reproductive: Uterus is within normal limits. Bilateral ovaries are within normal limits. Other: No abdominopelvic ascites. Musculoskeletal: Multifocal sclerotic lesions throughout the thoracolumbar spine, most of which are new/more conspicuous than recent PET-CT, although this may reflect treatment effect. Representative lesions include an 8 mm lesion in the T12 vertebral body (series 2/ image 52) and a 6 mm lesion in the L4 vertebral body (series 2/image 77. 12 mm lesion  in the left iliac bone (series 2/ image 83), new. Right shoulder arthroplasty. IMPRESSION: Status post left mastectomy with associated postsurgical changes in the left axilla/chest wall. Radiation changes in the left upper lobe. Improving subcarinal nodal metastasis, as above. Left supraclavicular/lower cervical nodal metastasis is not imaged. Multifocal osseous metastases, most of which are new/ more conspicuous than recent PET-CT, although this may reflect treatment effect. RECIST 1.1 measurements, as above. Electronically Signed   By: Julian Hy M.D.   On: 12/27/2015 10:35   Nm Bone Scan Whole Body  Result Date: 12/27/2015 CLINICAL DATA:  Breast carcinoma EXAM: NUCLEAR MEDICINE WHOLE BODY BONE SCAN TECHNIQUE: Whole body anterior and posterior images were obtained approximately 3 hours after intravenous injection of radiopharmaceutical. RADIOPHARMACEUTICALS:  19.885 mCi Technetium-89mMDP IV COMPARISON:  None. FINDINGS: There is abnormal radiotracer uptake in both shoulders, more on the right than on  the left. Abnormal uptake in each proximal humerus is suspicious for metastatic disease, likely superimposed arthropathy. There are foci of increased radiotracer uptake in several right-sided lateral ribs, primarily involving the right seventh and eighth ribs laterally. There is a small focus of increased uptake in the T7 vertebra as well as foci of increased uptake at T10, T11, and T12. More intense abnormal uptake is noted in the L1 and L5 vertebral bodies. They lesser degree of abnormal uptake is noted in the L4 vertebral body on the left. There is increased uptake focally in the superior left sacroiliac joint region as well as in the right upper sacral ala. There is abnormal uptake each greater trochanter of the proximal femurs bilaterally. There is abnormal uptake in each lateral ischium. Patient has had a total knee replacement on the left. Increased uptake in the right ankle is most likely of arthropathic etiology. Kidneys are noted in the flank positions bilaterally. IMPRESSION: Multifocal abnormal uptake consistent with metastatic disease. Electronically Signed   By: WLowella GripIII M.D.   On: 12/27/2015 14:55   Ct Abdomen Pelvis W Contrast  Result Date: 12/27/2015 CLINICAL DATA:  Inflammatory left breast cancer, status post left mastectomy, on chemotherapy EXAM: CT CHEST, ABDOMEN, AND PELVIS WITH CONTRAST TECHNIQUE: Multidetector CT imaging of the chest, abdomen and pelvis was performed following the standard protocol during bolus administration of intravenous contrast. CONTRAST:  1028mISOVUE-300 IOPAMIDOL (ISOVUE-300) INJECTION 61% COMPARISON:  PET-CT dated 09/08/2015 FINDINGS: RECIST 1.1 Target Lesions: 1. None Non-target Lesions: 1. 5 mm short axis subcarinal node (series 2/ image 22), previously 11 mm 2. Sclerotic osseous metastases in the visualized thoracolumbar spine and left pelvis CT CHEST FINDINGS Cardiovascular: The heart is normal in size. Trace pericardial fluid anteriorly.  Coronary atherosclerosis in the LAD. Mild atherosclerotic calcifications aortic arch. Right chest port terminates at the cavoatrial junction. Mediastinum/Nodes: 5 mm short axis subcarinal node (series 2/ image 22), previously 11 mm. Prior left supraclavicular/lower cervical node is not included on the current CT. Visualized thyroid is unremarkable. Lungs/Pleura: Radiation changes in the anterior left upper lobe. Mild patchy/ground-glass opacity in the right lower lobe. No focal consolidation. No suspicious pulmonary nodules. No pleural effusion or pneumothorax. Musculoskeletal: Status post left mastectomy. Postsurgical changes/seroma in the left chest wall/axilla (series 2/images 19 and 24). CT ABDOMEN PELVIS FINDINGS Hepatobiliary: Liver is within normal limits. No suspicious/enhancing hepatic lesions. Gallbladder is unremarkable. No intrahepatic or extrahepatic ductal dilatation. Pancreas: Within normal limits. Spleen: Within normal limits. Adrenals/Urinary Tract: Adrenal glands are within normal limits. Kidneys are within normal limits. Left extrarenal pelvis. No hydronephrosis. Bladder is mildly thick-walled  although underdistended. Stomach/Bowel: Stomach is within normal limits. No evidence of bowel obstruction. Mild to moderate stool in the rectum. Mild sigmoid diverticulosis, without evidence of diverticulitis. Vascular/Lymphatic: No evidence of abdominal aortic aneurysm. Atherosclerotic calcifications of the abdominal aorta and branch vessels. No suspicious abdominopelvic lymphadenopathy. Reproductive: Uterus is within normal limits. Bilateral ovaries are within normal limits. Other: No abdominopelvic ascites. Musculoskeletal: Multifocal sclerotic lesions throughout the thoracolumbar spine, most of which are new/more conspicuous than recent PET-CT, although this may reflect treatment effect. Representative lesions include an 8 mm lesion in the T12 vertebral body (series 2/ image 52) and a 6 mm lesion in the  L4 vertebral body (series 2/image 77. 12 mm lesion in the left iliac bone (series 2/ image 83), new. Right shoulder arthroplasty. IMPRESSION: Status post left mastectomy with associated postsurgical changes in the left axilla/chest wall. Radiation changes in the left upper lobe. Improving subcarinal nodal metastasis, as above. Left supraclavicular/lower cervical nodal metastasis is not imaged. Multifocal osseous metastases, most of which are new/ more conspicuous than recent PET-CT, although this may reflect treatment effect. RECIST 1.1 measurements, as above. Electronically Signed   By: Julian Hy M.D.   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 Now with biopsy proven stage IV disease with lymph node and bone metastasis.  PLAN:    1.  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 Patient will have a CT scan of the neck for further evaluation. Although bone scan suggestive of progression of disease, when compared to PET scan this is more likely sclerotic and evidence of treated metastasis and not new lesions. Overall patient has improvement of disease. Previously, she completed 5 years of Arimidex in June of 2015. Continue treatment on clinical trial "ACCRU" since she has a life expectancy of greater than 12 weeks. She will receive single agent Taxol on days 1, 8, and 15 with day 22 off. Patient has now completed her XRT. Delay treatment 1 week secondary to neutropenia. Return to clinic for further evaluation and consideration of cycle 4, day 1. If patient's side effects are intolerable, patient could discontinue clinical trial and be placed on aromatase inhibitor 2.  Osteopenia: Patient's bone mineral density from December 24, 2012  reported a T score of -1.2.  She has elected to disconitune Fosamax.  Continue calcium and vitamin D. 3. Anemia: Mild, monitor. 4. 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 5. Back pain: Resolved. Patient has now completed XRT. Continue hydrocodone as needed. 6. EKG: Proceed with routine EKGs as dictated per protocol. 7. Cough: Continue Tussionex as needed. 8. Visual changes: Unclear if related to chemotherapy or not. Monitor. 9. Leg pain: Appears to be positional and unrelated to chemotherapy. Monitor. 10. Memory loss: Unclear etiology, unlikely related to chemotherapy. Monitor.   Patient expressed understanding and was in agreement with this plan. She also understands that She can call clinic at any time with any questions, concerns, or complaints.   Breast cancer   Staging form: Breast, AJCC 7th Edition     Pathologic stage from 08/11/2014: Stage IIIA (T0, N2a, cM0) - Signed by Lloyd Huger, MD on 08/11/2014   Lloyd Huger, MD   01/03/2016 9:12 AM

## 2016-01-03 ENCOUNTER — Inpatient Hospital Stay: Payer: Managed Care, Other (non HMO)

## 2016-01-03 ENCOUNTER — Inpatient Hospital Stay (HOSPITAL_BASED_OUTPATIENT_CLINIC_OR_DEPARTMENT_OTHER): Payer: Managed Care, Other (non HMO) | Admitting: Oncology

## 2016-01-03 VITALS — BP 110/74 | HR 88 | Temp 97.3°F | Resp 18 | Wt 206.9 lb

## 2016-01-03 DIAGNOSIS — Z5111 Encounter for antineoplastic chemotherapy: Secondary | ICD-10-CM | POA: Diagnosis not present

## 2016-01-03 DIAGNOSIS — C50919 Malignant neoplasm of unspecified site of unspecified female breast: Secondary | ICD-10-CM

## 2016-01-03 DIAGNOSIS — Z9012 Acquired absence of left breast and nipple: Secondary | ICD-10-CM

## 2016-01-03 DIAGNOSIS — C773 Secondary and unspecified malignant neoplasm of axilla and upper limb lymph nodes: Secondary | ICD-10-CM | POA: Diagnosis not present

## 2016-01-03 DIAGNOSIS — C7951 Secondary malignant neoplasm of bone: Secondary | ICD-10-CM

## 2016-01-03 DIAGNOSIS — R05 Cough: Secondary | ICD-10-CM

## 2016-01-03 DIAGNOSIS — Z006 Encounter for examination for normal comparison and control in clinical research program: Secondary | ICD-10-CM | POA: Diagnosis not present

## 2016-01-03 DIAGNOSIS — C50912 Malignant neoplasm of unspecified site of left female breast: Secondary | ICD-10-CM

## 2016-01-03 DIAGNOSIS — D649 Anemia, unspecified: Secondary | ICD-10-CM

## 2016-01-03 DIAGNOSIS — R4182 Altered mental status, unspecified: Secondary | ICD-10-CM

## 2016-01-03 DIAGNOSIS — M8580 Other specified disorders of bone density and structure, unspecified site: Secondary | ICD-10-CM

## 2016-01-03 DIAGNOSIS — Z170 Estrogen receptor positive status [ER+]: Secondary | ICD-10-CM

## 2016-01-03 DIAGNOSIS — R413 Other amnesia: Secondary | ICD-10-CM

## 2016-01-03 DIAGNOSIS — M79606 Pain in leg, unspecified: Secondary | ICD-10-CM

## 2016-01-03 LAB — CBC WITH DIFFERENTIAL/PLATELET
BASOS PCT: 2 %
Basophils Absolute: 0.1 10*3/uL (ref 0–0.1)
EOS ABS: 0.1 10*3/uL (ref 0–0.7)
EOS PCT: 5 %
HCT: 29.4 % — ABNORMAL LOW (ref 35.0–47.0)
Hemoglobin: 9.9 g/dL — ABNORMAL LOW (ref 12.0–16.0)
LYMPHS ABS: 0.8 10*3/uL — AB (ref 1.0–3.6)
Lymphocytes Relative: 30 %
MCH: 31.4 pg (ref 26.0–34.0)
MCHC: 33.7 g/dL (ref 32.0–36.0)
MCV: 93 fL (ref 80.0–100.0)
MONOS PCT: 29 %
Monocytes Absolute: 0.8 10*3/uL (ref 0.2–0.9)
Neutro Abs: 0.9 10*3/uL — ABNORMAL LOW (ref 1.4–6.5)
Neutrophils Relative %: 34 %
PLATELETS: 295 10*3/uL (ref 150–440)
RBC: 3.17 MIL/uL — AB (ref 3.80–5.20)
RDW: 18.2 % — ABNORMAL HIGH (ref 11.5–14.5)
WBC: 2.7 10*3/uL — AB (ref 3.6–11.0)

## 2016-01-03 LAB — COMPREHENSIVE METABOLIC PANEL
ALT: 23 U/L (ref 14–54)
ANION GAP: 8 (ref 5–15)
AST: 31 U/L (ref 15–41)
Albumin: 3.5 g/dL (ref 3.5–5.0)
Alkaline Phosphatase: 56 U/L (ref 38–126)
BUN: 21 mg/dL — ABNORMAL HIGH (ref 6–20)
CHLORIDE: 105 mmol/L (ref 101–111)
CO2: 24 mmol/L (ref 22–32)
Calcium: 8.4 mg/dL — ABNORMAL LOW (ref 8.9–10.3)
Creatinine, Ser: 0.84 mg/dL (ref 0.44–1.00)
GFR calc non Af Amer: >60 mL/min (ref >60)
Glucose, Bld: 139 mg/dL — ABNORMAL HIGH (ref 65–99)
Potassium: 4.1 mmol/L (ref 3.5–5.1)
SODIUM: 137 mmol/L (ref 135–145)
Total Bilirubin: 0.5 mg/dL (ref 0.3–1.2)
Total Protein: 6.1 g/dL — ABNORMAL LOW (ref 6.5–8.1)

## 2016-01-03 MED ORDER — HEPARIN SOD (PORK) LOCK FLUSH 100 UNIT/ML IV SOLN
500.0000 [IU] | Freq: Once | INTRAVENOUS | Status: AC
  Administered 2016-01-03: 500 [IU] via INTRAVENOUS

## 2016-01-03 NOTE — Progress Notes (Signed)
The patient returned to clinic today accompanied by her husband for consideration of Cycle 4 Day1 Taxol infusion per protocol for ACCRU [id number] I. VS and weight are stable today. The patient reports continued numbness in her toes but not worse than last visit. She also reports pain in both legs that starts at her feet and moves up to her hips when she lays down at night. She first noticed the pain on Wednesday December 29, 2015 and was relieved by taking pain medication per patient report. Dr. Grayland Ormond in to discuss CT and bone scan results with patient as well as labs and current plan of care. Dr. Grayland Ormond states leg pain is not a result from the Taxol and that it is positional. The patient states while sitting and sleeping in her recliner, she feels no leg pain. Mr. Firebaugh discussed his concerns with Dr. Grayland Ormond regarding an episode on December 28, 2015 where he noticed his wife had a "deer in the headlights" moment. He states that she stood up and was warned not to fall over the blankets on the floor and at that point she fell into a lamp and back into the recliner where she stared off into space and did not remember the episode and she did not remember watching one of her taped tv shows. They are concerned that she may be having TIA's or that it could be a result of the Taxol. Dr. Grayland Ormond recommends monitoring her for additional symptoms and they are to report to our office if it happens again. He also ordered an MRI of the brain for further evaluation--scheduled for January 14, 2016. The patient continues to have some blurred vision; however, today is a "good day" she reports. She states that some days her eyes are good and some days they are worse. At this point, the recommendation is to continue monitoring the vision. The PRO-CTCAE phone survey was reviewed with the patient.  In reviewing the labs, Dr. Grayland Ormond explained to the patient and her husband that based on her [place] of 0.9, we will delay her  treatment until next week which also follows protocol requirements. She will also have a CT of the neck which is scheduled for tomorrow Tuesday January 04, 2016 at 1:00pm. She will return to clinic on Tuesday January 11, 2016 for consideration of Cycle 4 Day 1 Taxol infusion and labs, at which time Dr. Grayland Ormond will review results of the neck CT. All AE's and attributions are listed below:  Mirian Mo, RN, BSN 01/03/2016 11:48 AM      Malachy Moan [id number]  01/03/2016  Adverse Event Log  Study/Protocol: ACCRU [id number] I Cycle: 4 Day 1    *Treatment Delayed until 01/11/16     Event Grade Onset Date Resolved Date Drug Name Attribution Treatment Comments  Insomnia Grade 1 Prior to study ongoing Taxol Not related   Neurontin qhs Patient continues to report sleeping 8-10 hours a night  Headaches Grade 1 10/28/2015 ongoing Taxol Not related OTC Acetaminophen Reports mild headache on 11/04/15 and 11/06/15.   Cough  Grade 2 11/29/2015 ongoing Taxol Not related Tussinex and OTC Robitussin Pt. reports improvement  Decreased/blurred Vision Grade 1 11/29/2015 ongoing Taxol Possibly related Monitor for now No change since last visit 12/06/2015  Mild Anemia Grade 2 01/03/2016 ongoing Taxol Probably related None Monitor  Fatigue Grade 1 10/08/2015 ongoing Taxol Probably related None Monitor  Numbness in toes and fingers Toes>fingers Grade 1 12/11/2015 ongoing Taxol Probably Related None Monitor, patient reports only toes are numb  R>L  12/20/15    Decreased Appetite Grade 1 12/09/2015 ongoing Taxol Possibly related None Monitor  Decreased Neutrophil count Grade 3 12/20/2015 ongoing Taxol Probably related None Hold treatment today, re-evaluate 01/11/2016  Hypocalcemia Grade 1 12/20/2015 ongoing Taxol Probably related None Monitor  White blood cell decreased Grade 2 12/20/2015 ongoing Taxol Probably related None Monitor  Mild hypokalemia Grade 1 12/20/2015 01/03/2016 Taxol Probably related None Resolved  as of today  Lymphocytopenia Grade 1 12/13/2015 ongoing Taxol Probably related None Monitor  Leg pain Grade 2 12/29/2015 ongoing Taxol Not related Pain medication Pain in both legs when lying down flat (positional). Relieved by pain meds per patient.   Memory loss Grade 1 12/28/2015 12/28/2015 Taxol Possibly related None MRI of brain scheduled 01/14/2016   Mirian Mo, RN, BSN 01/03/2016 11:57 AM

## 2016-01-03 NOTE — Progress Notes (Signed)
Patient is here for follow up, she is doing well, she does mention that she has some dizziness and wants to discuss this with provider.

## 2016-01-04 ENCOUNTER — Other Ambulatory Visit: Payer: Self-pay | Admitting: *Deleted

## 2016-01-04 ENCOUNTER — Ambulatory Visit
Admission: RE | Admit: 2016-01-04 | Discharge: 2016-01-04 | Disposition: A | Discharge status: Home / Self Care | Payer: Managed Care, Other (non HMO) | Source: Ambulatory Visit | Attending: Oncology | Admitting: Oncology

## 2016-01-04 DIAGNOSIS — C50919 Malignant neoplasm of unspecified site of unspecified female breast: Secondary | ICD-10-CM

## 2016-01-04 DIAGNOSIS — C801 Malignant (primary) neoplasm, unspecified: Secondary | ICD-10-CM

## 2016-01-04 MED ORDER — HEPARIN SOD (PORK) LOCK FLUSH 10 UNIT/ML IV SOLN
50.0000 [IU] | Freq: Once | INTRAVENOUS | Status: AC
  Administered 2016-01-04: 50 [IU] via INTRAVENOUS
  Filled 2016-01-04: qty 5

## 2016-01-04 MED ORDER — IOPAMIDOL (ISOVUE-300) INJECTION 61%
75.0000 mL | Freq: Once | INTRAVENOUS | Status: AC | PRN
  Administered 2016-01-04: 75 mL via INTRAVENOUS

## 2016-01-04 NOTE — OR Nursing (Signed)
Port accessed for ct contrast per Dr Grayland Ormond order, deaccessed post scan after heparin flush. Site unremarkable.

## 2016-01-06 ENCOUNTER — Ambulatory Visit
Admission: RE | Admit: 2016-01-06 | Discharge: 2016-01-06 | Disposition: A | Discharge status: Home / Self Care | Payer: 59 | Source: Ambulatory Visit | Attending: Radiation Oncology | Admitting: Radiation Oncology

## 2016-01-06 ENCOUNTER — Encounter: Payer: Self-pay | Admitting: Radiation Oncology

## 2016-01-06 VITALS — BP 132/78 | HR 80 | Temp 95.1°F | Resp 20 | Wt 208.2 lb

## 2016-01-06 DIAGNOSIS — C50912 Malignant neoplasm of unspecified site of left female breast: Secondary | ICD-10-CM | POA: Diagnosis not present

## 2016-01-06 DIAGNOSIS — Z923 Personal history of irradiation: Secondary | ICD-10-CM | POA: Diagnosis not present

## 2016-01-06 DIAGNOSIS — C7951 Secondary malignant neoplasm of bone: Secondary | ICD-10-CM | POA: Insufficient documentation

## 2016-01-06 DIAGNOSIS — C50919 Malignant neoplasm of unspecified site of unspecified female breast: Secondary | ICD-10-CM

## 2016-01-06 DIAGNOSIS — Z170 Estrogen receptor positive status [ER+]: Secondary | ICD-10-CM | POA: Diagnosis not present

## 2016-01-06 NOTE — Progress Notes (Signed)
Radiation Oncology Follow up Note  Name: Amanda [name]   Date:   01/06/2016 MRN:  [id number] DOB: 1950-06-03    This 65 y.o. 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 She recently completed palliative radiation therapy to her lumbar spine for stage IV metastatic disease. She's excellent palliative result. She specifically denies lower back pain and difficulty ambulating or any motor or sensory loss in her lower extremities.. She is currently on Taxol therapy tolerating that well.  COMPLICATIONS OF TREATMENT: none  FOLLOW UP COMPLIANCE: keeps appointments   PHYSICAL EXAM:  BP 132/78   Pulse 80   Temp (!) 95.1 F (35.1 C)   Resp 20   Wt 208 lb 3.6 oz (94.4 kg)   BMI 35.55 kg/m  No pain is elicited on deep palpation of her lumbar spine. Motor sensory and DTR levels are equal and symmetric in lower extremities. Range of motion of her lower extremities does not elicit pain. Well-developed well-nourished patient in NAD. HEENT reveals PERLA, EOMI, discs not visualized.  Oral cavity is clear. No oral mucosal lesions are identified. Neck is clear without evidence of cervical or supraclavicular adenopathy. Lungs are clear to A&P. Cardiac examination is essentially unremarkable with regular rate and rhythm without murmur rub or thrill. Abdomen is benign with no organomegaly or masses noted. Motor sensory and DTR levels are equal and symmetric in the upper and lower extremities. Cranial nerves II through XII are grossly intact. Proprioception is intact. No peripheral adenopathy or edema is identified. No motor or sensory levels are noted. Crude visual fields are within  normal range.  RADIOLOGY RESULTS: No current films for review  PLAN: At the present time she's had excellent palliative benefit from radiation therapy. I am please were overall progress. I've turn follow-up care over to medical oncology. I would be happy to reevaluate the patient any time should further palliative treatment be indicated.  I would like to take this opportunity to thank you for allowing me to participate in the care of your patient.Armstead Peaks., MD

## 2016-01-07 ENCOUNTER — Other Ambulatory Visit: Payer: Self-pay | Admitting: Oncology

## 2016-01-07 ENCOUNTER — Other Ambulatory Visit: Payer: Self-pay

## 2016-01-07 DIAGNOSIS — C50912 Malignant neoplasm of unspecified site of left female breast: Secondary | ICD-10-CM

## 2016-01-07 DIAGNOSIS — Z170 Estrogen receptor positive status [ER+]: Principal | ICD-10-CM

## 2016-01-11 ENCOUNTER — Inpatient Hospital Stay: Payer: Managed Care, Other (non HMO)

## 2016-01-11 ENCOUNTER — Encounter: Payer: Self-pay | Admitting: Oncology

## 2016-01-11 ENCOUNTER — Inpatient Hospital Stay (HOSPITAL_BASED_OUTPATIENT_CLINIC_OR_DEPARTMENT_OTHER): Payer: Managed Care, Other (non HMO) | Admitting: Oncology

## 2016-01-11 ENCOUNTER — Ambulatory Visit: Payer: Managed Care, Other (non HMO)

## 2016-01-11 VITALS — BP 109/74 | HR 80 | Temp 97.9°F | Resp 18 | Wt 201.9 lb

## 2016-01-11 DIAGNOSIS — C50912 Malignant neoplasm of unspecified site of left female breast: Secondary | ICD-10-CM

## 2016-01-11 DIAGNOSIS — Z006 Encounter for examination for normal comparison and control in clinical research program: Secondary | ICD-10-CM | POA: Diagnosis not present

## 2016-01-11 DIAGNOSIS — Z170 Estrogen receptor positive status [ER+]: Principal | ICD-10-CM

## 2016-01-11 DIAGNOSIS — C50919 Malignant neoplasm of unspecified site of unspecified female breast: Secondary | ICD-10-CM

## 2016-01-11 DIAGNOSIS — Z9012 Acquired absence of left breast and nipple: Secondary | ICD-10-CM

## 2016-01-11 DIAGNOSIS — R413 Other amnesia: Secondary | ICD-10-CM

## 2016-01-11 DIAGNOSIS — C7951 Secondary malignant neoplasm of bone: Secondary | ICD-10-CM

## 2016-01-11 DIAGNOSIS — M8580 Other specified disorders of bone density and structure, unspecified site: Secondary | ICD-10-CM

## 2016-01-11 DIAGNOSIS — C773 Secondary and unspecified malignant neoplasm of axilla and upper limb lymph nodes: Secondary | ICD-10-CM

## 2016-01-11 DIAGNOSIS — R05 Cough: Secondary | ICD-10-CM

## 2016-01-11 DIAGNOSIS — Z95828 Presence of other vascular implants and grafts: Secondary | ICD-10-CM

## 2016-01-11 DIAGNOSIS — Z5111 Encounter for antineoplastic chemotherapy: Secondary | ICD-10-CM | POA: Diagnosis not present

## 2016-01-11 DIAGNOSIS — M79606 Pain in leg, unspecified: Secondary | ICD-10-CM

## 2016-01-11 DIAGNOSIS — D649 Anemia, unspecified: Secondary | ICD-10-CM

## 2016-01-11 LAB — CBC WITH DIFFERENTIAL/PLATELET
BASOS ABS: 0.1 10*3/uL (ref 0–0.1)
Basophils Relative: 2 %
EOS PCT: 14 %
Eosinophils Absolute: 0.6 10*3/uL (ref 0–0.7)
HEMATOCRIT: 33.2 % — AB (ref 35.0–47.0)
Hemoglobin: 11.3 g/dL — ABNORMAL LOW (ref 12.0–16.0)
LYMPHS ABS: 0.8 10*3/uL — AB (ref 1.0–3.6)
LYMPHS PCT: 18 %
MCH: 31.9 pg (ref 26.0–34.0)
MCHC: 33.9 g/dL (ref 32.0–36.0)
MCV: 94.1 fL (ref 80.0–100.0)
Monocytes Absolute: 0.7 10*3/uL (ref 0.2–0.9)
Monocytes Relative: 17 %
NEUTROS ABS: 2.1 10*3/uL (ref 1.4–6.5)
Neutrophils Relative %: 49 %
Platelets: 311 10*3/uL (ref 150–440)
RBC: 3.53 MIL/uL — AB (ref 3.80–5.20)
RDW: 18.4 % — ABNORMAL HIGH (ref 11.5–14.5)
WBC: 4.3 10*3/uL (ref 3.6–11.0)

## 2016-01-11 LAB — COMPREHENSIVE METABOLIC PANEL
ALK PHOS: 56 U/L (ref 38–126)
ALT: 24 U/L (ref 14–54)
AST: 36 U/L (ref 15–41)
Albumin: 3.8 g/dL (ref 3.5–5.0)
Anion gap: 5 (ref 5–15)
BILIRUBIN TOTAL: 0.5 mg/dL (ref 0.3–1.2)
BUN: 25 mg/dL — AB (ref 6–20)
CALCIUM: 9.3 mg/dL (ref 8.9–10.3)
CO2: 27 mmol/L (ref 22–32)
CREATININE: 0.9 mg/dL (ref 0.44–1.00)
Chloride: 104 mmol/L (ref 101–111)
Glucose, Bld: 135 mg/dL — ABNORMAL HIGH (ref 65–99)
Potassium: 4.1 mmol/L (ref 3.5–5.1)
Sodium: 136 mmol/L (ref 135–145)
TOTAL PROTEIN: 6.5 g/dL (ref 6.5–8.1)

## 2016-01-11 MED ORDER — LETROZOLE 2.5 MG PO TABS: 2.5000 mg | ORAL_TABLET | Freq: Every day | ORAL | 3 refills | Status: DC

## 2016-01-11 MED ORDER — SODIUM CHLORIDE 0.9% FLUSH
10.0000 mL | INTRAVENOUS | Status: DC | PRN
  Administered 2016-01-11: 10 mL via INTRAVENOUS
  Filled 2016-01-11: qty 10

## 2016-01-11 MED ORDER — HEPARIN SOD (PORK) LOCK FLUSH 100 UNIT/ML IV SOLN: 500.0000 [IU] | Freq: Once | INTRAVENOUS | Status: DC

## 2016-01-11 MED ORDER — HEPARIN SOD (PORK) LOCK FLUSH 100 UNIT/ML IV SOLN
500.0000 [IU] | Freq: Once | INTRAVENOUS | Status: AC
  Administered 2016-01-11: 500 [IU] via INTRAVENOUS

## 2016-01-11 MED ORDER — SODIUM CHLORIDE 0.9% FLUSH
10.0000 mL | INTRAVENOUS | Status: DC | PRN
  Filled 2016-01-11: qty 10

## 2016-01-11 NOTE — Progress Notes (Signed)
Patient here today as add on per Dr. Grayland Ormond regarding in bos 01-04-16 from research.  Patient did not take BP medications today.

## 2016-01-11 NOTE — Progress Notes (Signed)
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 Now with biopsy-proven stage IV disease.  INTERVAL HISTORY: Patient returns to clinic today for further evaluation and discussion of her imaging results. She continues to have neuropathy in her fingertips and feet, right foot worse than left. She is not complaining of leg pain today. Her husband describes episodes where patient does not remember anything of a conversation. She has continued visual changes since starting chemotherapy. She also has a persistent dry cough. She otherwise feels well and is asymptomatic. She has no other neurologic complaints. She denies any recent fevers or illnesses.  She denies any chest pain or shortness of breath.  She denies any nausea, vomiting, constipation, or diarrhea.  She has no urinary complaints.  Patient offers no specific complaints today.  REVIEW OF SYSTEMS:   Review of Systems  Constitutional: Negative.  Negative for fever, malaise/fatigue and weight loss.  Eyes: Positive for blurred vision.  Respiratory: Positive for cough. Negative for shortness of breath.   Cardiovascular: Negative.  Negative for chest pain.  Gastrointestinal: Negative.  Negative for abdominal pain.  Genitourinary: Negative.   Musculoskeletal: Positive for back pain.  Neurological: Positive for sensory change. Negative for weakness.  Endo/Heme/Allergies: Negative.   Psychiatric/Behavioral: Positive for memory loss. The patient is not nervous/anxious.     As per HPI. Otherwise, a complete review of systems is negative.  PAST MEDICAL HISTORY: Past Medical History:  Diagnosis Date    . Back pain    occasionally  . Breast cancer ([place])    left  . Depression    takes Paxil and Wellbutrin daily  . Diabetes ([place])    takes Metformin daily  . GERD (gastroesophageal reflux disease)   . History of bronchitis 2 yrs ago  . Hyperlipidemia    takes Atorvastatin daily  . Hypertension    takes Losartan-HCTZ daily  . Insomnia    takes Ambien nightly  . Insomnia    takes gabapentin nightly  . Joint pain   . Migraine   . Mood swings ([place])   . Osteoarthritis of knee   . PONV (postoperative nausea and vomiting)   . Seasonal allergies    takes Allegra daily    PAST SURGICAL HISTORY: Past Surgical History:  Procedure Laterality Date  . BREAST BIOPSY  2009  . cataract surgery Bilateral   . COLONOSCOPY    . JOINT REPLACEMENT Left 2014   knee  . KNEE ARTHROSCOPY Left    x 5  . MASTECTOMY Left   . port a cath placed    . PORTACATH PLACEMENT N/A 09/17/2015   Procedure: INSERTION PORT-A-CATH;  Surgeon: Jules Husbands, MD;  Location: [facility];  Service: General;  Laterality: N/A;  . TOTAL SHOULDER ARTHROPLASTY Right 06/03/2015   Procedure: TOTAL SHOULDER ARTHROPLASTY;  Surgeon: Tania Ade, MD;  Location: [facility];  Service: Orthopedics;  Laterality: Right;  Right total shoulder arthroplasty  . TOTAL SHOULDER REPLACEMENT Right 06/03/2015  . TUBAL LIGATION      FAMILY HISTORY: Father with non-Hodgkin's lymphoma, 2 paternal aunts with breast cancer.     ADVANCED DIRECTIVES:    HEALTH MAINTENANCE: Social History  Substance Use Topics  . Smoking status: Never Smoker  .  Smokeless tobacco: Never Used  . Alcohol use 0.0 oz/week     Comment: occasionally wine     Colonoscopy:  PAP:  Bone density:  Lipid panel:  Allergies  Allergen Reactions  . Ace Inhibitors Cough  . Latex Itching  . Morphine And Related Itching    Caused her to itch terribly. Would prefer if given to take with a benadryl  . Other Itching    Freeze spray. Patient stated that she may be able to  use it now because she doesn't use it as often.  Marland Kitchen Penicillins Rash    Has patient had a PCN reaction causing immediate rash, facial/tongue/throat swelling, SOB or lightheadedness with hypotension: No Has patient had a PCN reaction causing severe rash involving mucus membranes or skin necrosis: No Has patient had a PCN reaction that required hospitalization No Has patient had a PCN reaction occurring within the last 10 years: No If all of the above answers are "NO", then may proceed with Cephalosporin use.    Current Outpatient Prescriptions  Medication Sig Dispense Refill  . aspirin EC 81 MG tablet Take 81 mg by mouth daily.     Marland Kitchen atorvastatin (LIPITOR) 10 MG tablet Take 10 mg by mouth daily at 6 PM.     . b complex vitamins capsule Take 1 capsule by mouth daily.    Marland Kitchen buPROPion (WELLBUTRIN XL) 150 MG 24 hr tablet Take 150 mg by mouth every morning.     . Calcium Carbonate-Vit D-Min (CALCIUM 600+D PLUS MINERALS) 600-400 MG-UNIT TABS Take 1 tablet by mouth 2 (two) times daily.     . chlorpheniramine-HYDROcodone (TUSSIONEX) 10-8 MG/5ML SUER Take 5 mLs by mouth every 12 (twelve) hours as needed for cough. 140 mL 0  . fexofenadine (ALLEGRA) 180 MG tablet Take 180 mg by mouth at bedtime.     . gabapentin (NEURONTIN) 300 MG capsule Take 300 mg by mouth at bedtime.  1  . HYDROcodone-acetaminophen (NORCO/VICODIN) 5-325 MG tablet Take 1-2 tablets by mouth every 6 (six) hours as needed for moderate pain. 30 tablet 0  . lidocaine-prilocaine (EMLA) cream Apply 1 application topically as needed. Apply to port 1-2 hours prior to chemotherapy appointment. Cover with plastic wrap. 30 g 0  . losartan-hydrochlorothiazide (HYZAAR) 100-25 MG per tablet Take 1 tablet by mouth every morning.     . metFORMIN (GLUCOPHAGE) 850 MG tablet Take 850 mg by mouth 2 (two) times daily with a meal.     . PARoxetine (PAXIL) 40 MG tablet Take 40 mg by mouth every morning.     . traMADol (ULTRAM) 50 MG tablet Take 1 tablet (50  mg total) by mouth 2 (two) times daily. 60 tablet 0  . Zolpidem Tartrate (AMBIEN PO) Take by mouth.    . doxycycline (VIBRA-TABS) 100 MG tablet TAKE 1 TABLET (100 MG TOTAL) BY MOUTH 2 (TWO) TIMES DAILY.  0  . letrozole (FEMARA) 2.5 MG tablet Take 1 tablet (2.5 mg total) by mouth daily. 90 tablet 3   Current Facility-Administered Medications  Medication Dose Route Frequency Provider Last Rate Last Dose  . guaiFENesin-dextromethorphan (ROBITUSSIN DM) 100-10 MG/5ML syrup 5 mL  5 mL Oral Once Lloyd Huger, MD        OBJECTIVE: Vitals:   01/11/16 0906  BP: 109/74  Pulse: 80  Resp: 18  Temp: 97.9 F (36.6 C)     Body mass index is 34.48 kg/m.    ECOG FS:0 - Asymptomatic  General: Well-developed, well-nourished, no acute distress.  Eyes: Pink conjunctiva, anicteric sclera. HEENT: Easily palpable left neck lymphadenopathy. Breast: Left chest wall without evidence of recurrence.  Right breast and axilla without lumps or masses. Exam deferred today. Lungs: Clear to auscultation bilaterally. Heart: Regular rate and rhythm. No rubs, murmurs, or gallops. Abdomen: Soft, nontender, nondistended. No organomegaly noted, normoactive bowel sounds. Musculoskeletal: No edema, cyanosis, or clubbing. Neuro: Alert, answering all questions appropriately. Cranial nerves grossly intact. Skin: No rashes or petechiae noted. Psych: Normal affect.   LAB RESULTS:  Lab Results  Component Value Date   NA 136 01/11/2016   K 4.1 01/11/2016   CL 104 01/11/2016   CO2 27 01/11/2016   GLUCOSE 135 (H) 01/11/2016   BUN 25 (H) 01/11/2016   CREATININE 0.90 01/11/2016   CALCIUM 9.3 01/11/2016   PROT 6.5 01/11/2016   ALBUMIN 3.8 01/11/2016   AST 36 01/11/2016   ALT 24 01/11/2016   ALKPHOS 56 01/11/2016   BILITOT 0.5 01/11/2016   GFRNONAA >60 01/11/2016   GFRAA >60 01/11/2016    Lab Results  Component Value Date   WBC 4.3 01/11/2016   NEUTROABS 2.1 01/11/2016   HGB 11.3 (L) 01/11/2016   HCT 33.2  (L) 01/11/2016   MCV 94.1 01/11/2016   PLT 311 01/11/2016     STUDIES: Ct Soft Tissue Neck W Contrast  Result Date: 01/04/2016 CLINICAL DATA:  Metastatic breast cancer EXAM: CT NECK WITH CONTRAST TECHNIQUE: Multidetector CT imaging of the neck was performed using the standard protocol following the bolus administration of intravenous contrast. CONTRAST:  42m ISOVUE-300 IOPAMIDOL (ISOVUE-300) INJECTION 61% COMPARISON:  PET 09/08/2015, CT neck 08/27/2015 FINDINGS: Pharynx and larynx: Normal pharynx.  No mass or swelling. Salivary glands: Negative Thyroid: Negative Lymph nodes: Left lower level 5 lymph node now measures 16 mm, compared with 20 mm on 08/27/2015. Irregular margins. No new adenopathy. Vascular: Carotid artery calcification. Carotid artery and jugular vein patent. Right jugular Port-A-Cath. Limited intracranial: Negative Visualized orbits: Negative Mastoids and visualized paranasal sinuses: Negative Skeleton: Negative for fracture. 8 mm sclerotic lesion C4 vertebral body on the right was not present previously and is consistent with metastatic disease. Improvement in small lymph node lateral to the left lower jugular vein. Dental disease with periodontal low-density around right upper molar root. Upper chest: Negative Other: None IMPRESSION: Left lower level 5 lymph node has improved in size in than Mr. 16 mm. No new adenopathy New sclerotic lesion C4 vertebral body on the right consistent with bony metastatic disease. Electronically Signed   By: CFranchot GalloM.D.   On: 01/04/2016 14:09   Ct Chest W Contrast  Result Date: 12/27/2015 CLINICAL DATA:  Inflammatory left breast cancer, status post left mastectomy, on chemotherapy EXAM: CT CHEST, ABDOMEN, AND PELVIS WITH CONTRAST TECHNIQUE: Multidetector CT imaging of the chest, abdomen and pelvis was performed following the standard protocol during bolus administration of intravenous contrast. CONTRAST:  1030mISOVUE-300 IOPAMIDOL (ISOVUE-300)  INJECTION 61% COMPARISON:  PET-CT dated 09/08/2015 FINDINGS: RECIST 1.1 Target Lesions: 1. None Non-target Lesions: 1. 5 mm short axis subcarinal node (series 2/ image 22), previously 11 mm 2. Sclerotic osseous metastases in the visualized thoracolumbar spine and left pelvis CT CHEST FINDINGS Cardiovascular: The heart is normal in size. Trace pericardial fluid anteriorly. Coronary atherosclerosis in the LAD. Mild atherosclerotic calcifications aortic arch. Right chest port terminates at the cavoatrial junction. Mediastinum/Nodes: 5 mm short axis subcarinal node (series 2/ image 22), previously 11 mm. Prior left supraclavicular/lower cervical node is not included on the current CT. Visualized  thyroid is unremarkable. Lungs/Pleura: Radiation changes in the anterior left upper lobe. Mild patchy/ground-glass opacity in the right lower lobe. No focal consolidation. No suspicious pulmonary nodules. No pleural effusion or pneumothorax. Musculoskeletal: Status post left mastectomy. Postsurgical changes/seroma in the left chest wall/axilla (series 2/images 19 and 24). CT ABDOMEN PELVIS FINDINGS Hepatobiliary: Liver is within normal limits. No suspicious/enhancing hepatic lesions. Gallbladder is unremarkable. No intrahepatic or extrahepatic ductal dilatation. Pancreas: Within normal limits. Spleen: Within normal limits. Adrenals/Urinary Tract: Adrenal glands are within normal limits. Kidneys are within normal limits. Left extrarenal pelvis. No hydronephrosis. Bladder is mildly thick-walled although underdistended. Stomach/Bowel: Stomach is within normal limits. No evidence of bowel obstruction. Mild to moderate stool in the rectum. Mild sigmoid diverticulosis, without evidence of diverticulitis. Vascular/Lymphatic: No evidence of abdominal aortic aneurysm. Atherosclerotic calcifications of the abdominal aorta and branch vessels. No suspicious abdominopelvic lymphadenopathy. Reproductive: Uterus is within normal limits.  Bilateral ovaries are within normal limits. Other: No abdominopelvic ascites. Musculoskeletal: Multifocal sclerotic lesions throughout the thoracolumbar spine, most of which are new/more conspicuous than recent PET-CT, although this may reflect treatment effect. Representative lesions include an 8 mm lesion in the T12 vertebral body (series 2/ image 52) and a 6 mm lesion in the L4 vertebral body (series 2/image 77. 12 mm lesion in the left iliac bone (series 2/ image 83), new. Right shoulder arthroplasty. IMPRESSION: Status post left mastectomy with associated postsurgical changes in the left axilla/chest wall. Radiation changes in the left upper lobe. Improving subcarinal nodal metastasis, as above. Left supraclavicular/lower cervical nodal metastasis is not imaged. Multifocal osseous metastases, most of which are new/ more conspicuous than recent PET-CT, although this may reflect treatment effect. RECIST 1.1 measurements, as above. Electronically Signed   By: Julian Hy M.D.   On: 12/27/2015 10:35   Nm Bone Scan Whole Body  Result Date: 12/27/2015 CLINICAL DATA:  Breast carcinoma EXAM: NUCLEAR MEDICINE WHOLE BODY BONE SCAN TECHNIQUE: Whole body anterior and posterior images were obtained approximately 3 hours after intravenous injection of radiopharmaceutical. RADIOPHARMACEUTICALS:  19.885 mCi Technetium-31mMDP IV COMPARISON:  None. FINDINGS: There is abnormal radiotracer uptake in both shoulders, more on the right than on the left. Abnormal uptake in each proximal humerus is suspicious for metastatic disease, likely superimposed arthropathy. There are foci of increased radiotracer uptake in several right-sided lateral ribs, primarily involving the right seventh and eighth ribs laterally. There is a small focus of increased uptake in the T7 vertebra as well as foci of increased uptake at T10, T11, and T12. More intense abnormal uptake is noted in the L1 and L5 vertebral bodies. They lesser degree of  abnormal uptake is noted in the L4 vertebral body on the left. There is increased uptake focally in the superior left sacroiliac joint region as well as in the right upper sacral ala. There is abnormal uptake each greater trochanter of the proximal femurs bilaterally. There is abnormal uptake in each lateral ischium. Patient has had a total knee replacement on the left. Increased uptake in the right ankle is most likely of arthropathic etiology. Kidneys are noted in the flank positions bilaterally. IMPRESSION: Multifocal abnormal uptake consistent with metastatic disease. Electronically Signed   By: WLowella GripIII M.D.   On: 12/27/2015 14:55   Ct Abdomen Pelvis W Contrast  Result Date: 12/27/2015 CLINICAL DATA:  Inflammatory left breast cancer, status post left mastectomy, on chemotherapy EXAM: CT CHEST, ABDOMEN, AND PELVIS WITH CONTRAST TECHNIQUE: Multidetector CT imaging of the chest, abdomen and pelvis was  performed following the standard protocol during bolus administration of intravenous contrast. CONTRAST:  115m ISOVUE-300 IOPAMIDOL (ISOVUE-300) INJECTION 61% COMPARISON:  PET-CT dated 09/08/2015 FINDINGS: RECIST 1.1 Target Lesions: 1. None Non-target Lesions: 1. 5 mm short axis subcarinal node (series 2/ image 22), previously 11 mm 2. Sclerotic osseous metastases in the visualized thoracolumbar spine and left pelvis CT CHEST FINDINGS Cardiovascular: The heart is normal in size. Trace pericardial fluid anteriorly. Coronary atherosclerosis in the LAD. Mild atherosclerotic calcifications aortic arch. Right chest port terminates at the cavoatrial junction. Mediastinum/Nodes: 5 mm short axis subcarinal node (series 2/ image 22), previously 11 mm. Prior left supraclavicular/lower cervical node is not included on the current CT. Visualized thyroid is unremarkable. Lungs/Pleura: Radiation changes in the anterior left upper lobe. Mild patchy/ground-glass opacity in the right lower lobe. No focal  consolidation. No suspicious pulmonary nodules. No pleural effusion or pneumothorax. Musculoskeletal: Status post left mastectomy. Postsurgical changes/seroma in the left chest wall/axilla (series 2/images 19 and 24). CT ABDOMEN PELVIS FINDINGS Hepatobiliary: Liver is within normal limits. No suspicious/enhancing hepatic lesions. Gallbladder is unremarkable. No intrahepatic or extrahepatic ductal dilatation. Pancreas: Within normal limits. Spleen: Within normal limits. Adrenals/Urinary Tract: Adrenal glands are within normal limits. Kidneys are within normal limits. Left extrarenal pelvis. No hydronephrosis. Bladder is mildly thick-walled although underdistended. Stomach/Bowel: Stomach is within normal limits. No evidence of bowel obstruction. Mild to moderate stool in the rectum. Mild sigmoid diverticulosis, without evidence of diverticulitis. Vascular/Lymphatic: No evidence of abdominal aortic aneurysm. Atherosclerotic calcifications of the abdominal aorta and branch vessels. No suspicious abdominopelvic lymphadenopathy. Reproductive: Uterus is within normal limits. Bilateral ovaries are within normal limits. Other: No abdominopelvic ascites. Musculoskeletal: Multifocal sclerotic lesions throughout the thoracolumbar spine, most of which are new/more conspicuous than recent PET-CT, although this may reflect treatment effect. Representative lesions include an 8 mm lesion in the T12 vertebral body (series 2/ image 52) and a 6 mm lesion in the L4 vertebral body (series 2/image 77. 12 mm lesion in the left iliac bone (series 2/ image 83), new. Right shoulder arthroplasty. IMPRESSION: Status post left mastectomy with associated postsurgical changes in the left axilla/chest wall. Radiation changes in the left upper lobe. Improving subcarinal nodal metastasis, as above. Left supraclavicular/lower cervical nodal metastasis is not imaged. Multifocal osseous metastases, most of which are new/ more conspicuous than recent  PET-CT, although this may reflect treatment effect. RECIST 1.1 measurements, as above. Electronically Signed   By: SJulian HyM.D.   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 Now with biopsy proven stage IV disease with lymph node and bone metastasis.  PLAN:    1.  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 After lengthy discussion with the patient, given her neuropathy and visual changes she wishes to discontinue chemotherapy altogether and pursue treatment with an aromatase inhibitor. She will stay on letrozole indefinitely or until progression of disease.  Because of this, she will no longer be enrolled in the clinical trial. Will get a baseline bone mineral density in the next 1-2 weeks. Patient will then return to clinic in 3 months with repeat imaging and further evaluation. Given her bony disease, can consider Zometa in the future. Previously, she completed 5 years of Arimidex in  June of 2015.  2.  Osteopenia: Patient's bone mineral density from December 24, 2012 reported a T score of -1.2.  She has elected to disconitune Fosamax.  Continue calcium and vitamin D. Mineral density as above. 3. Anemia: Mild, monitor. 4. 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 5. Back pain: Resolved. Patient has now completed XRT. Continue hydrocodone as needed. 6. Cough: Continue Tussionex as needed. 7. Visual changes: Discontinue chemotherapy as above. 8. Leg pain: Appears to be positional and unrelated to chemotherapy. Monitor. 9. Memory loss: Unclear etiology, unlikely related to chemotherapy. Monitor.   Patient expressed understanding  and was in agreement with this plan. She also understands that She can call clinic at any time with any questions, concerns, or complaints.   Breast cancer   Staging form: Breast, AJCC 7th Edition     Pathologic stage from 08/11/2014: Stage IIIA (T0, N2a, cM0) - Signed by Lloyd Huger, MD on 08/11/2014   Lloyd Huger, MD   01/12/2016 4:49 PM

## 2016-01-11 NOTE — Progress Notes (Signed)
The patient returns to clinic today accompanied by her husband for consideration of Cycle 4 Day 1 Taxol infusion per protocol for [facility] [id number]. VS and weight are stable today.  The patient reports that she continues to have numbness in her toes --especially her right toes.  She states that her shoes feel weird and sometimes her right foot just goes to sleep. The  patient's husband reports his wife's appetite has improved. She also continues to report pain in both legs that starts at her feet and moves up to her hips when she lays down at night but not worse since last visit on 01/03/2016. Patient reports her vision continues to be blurry and bothersome. Dr. Grayland Ormond in to discuss labs which are all within acceptable parameters to proceed with Taxol treatment today. ANC improved from 0.9 to 2.1 today. Dr. Grayland Ormond discussed results of neck CT and explained to the patient and her husband that the results show no progression of disease and everything at this point is stable. Dr. Grayland Ormond also discussed treatment options with the patient and after much consideration the patient has decided to withdraw from the study and to begin Letrozole. The patient stated her decision was based on prior success with an AI and the fact that the side effects from the Taxol have been difficult for her. Dr. Grayland Ormond will also order a bone density test and schedule a return appointment for three months. Dr. Grayland Ormond, the patient and her husband were all in agreement with this plan. The patient signed a withdrawal of treatment consent form and central labs were collected. This nurse thanked the patient for her participation in the study. AE's and attributions listed below:  Amanda Mo, RN, BSN 01/11/2016 11:52 AM     Amanda [name] [id number]  01/11/2016  Adverse Event Log  Study/Protocol: [facility] [id number] I Cycle: 4 Day 1      Patient elected to withdraw from research study. No treatment administered today.     Event Grade  Onset Date Resolved Date Drug Name Attribution Treatment Comments  Insomnia Grade 1 Prior to study ongoing Taxol Not related   Neurontin qhs Patient continues to report sleeping 8-10 hours a night           Headaches Grade 1 10/28/2015 ongoing Taxol Not related OTC Acetaminophen Reports mild headache on 11/04/15 and 11/06/15.   Cough  Grade 2 11/29/2015 ongoing Taxol Not related Tussinex and OTC Robitussin Pt. reports improvement  Decreased/blurred Vision Grade 1 11/29/2015 ongoing Taxol Possibly related Monitor for now No change since last visit 12/06/2015  Mild Anemia Grade 1 10/25/2015 ongoing Taxol Probably related None Monitor  Fatigue Grade 1 10/08/2015 ongoing Taxol Probably related None Monitor  Numbness in toes R>L Grade 2 12/11/2015 ongoing Taxol Probably Related None Monitor, patient reports only toes are numb R>L  12/20/15    Decreased Appetite Grade 1 12/09/2015 ongoing Taxol Possibly related None Improved  Decreased Neutrophil count Grade 2 12/20/2015 01/11/2016 Taxol Probably related None Monitor  Hypocalcemia Grade 1 12/20/2015 01/11/2016 Taxol Probably related None Monitor  White blood cell decreased Grade 2 12/20/2015 01/11/2016 Taxol Probably related None Monitor  Mild hypokalemia Grade 1 12/20/2015 01/03/2016 Taxol Probably related None Moniitor  Leg pain  Grade 2 12/29/2015 ongoing Taxol Not related Pain medication Pt. reports relief of symptom with pain medication  Lymphocytopenia Grade 1 12/13/2015 ongoing Taxol Probably related None Monitor  Decreased appetite Grade 1 12/09/2015 ongoing Taxol Possibly related None Monitor   Amanda Mo, RN, BSN 01/11/2016 11:55  AM

## 2016-01-13 NOTE — Progress Notes (Signed)
The patient called to inquire about a cardiology appointment that Dr. Grayland Ormond had recommended a few weeks ago. She was given the information regarding recent EKG results and agrees to schedule an appointment with her cardiologist for follow up. She also asked when her next port flush was due. An appointment was made for February 21, 2016 @ 8:30am. The patient repeated back the appointment date and time.  Mirian Mo, RN, BSN 01/13/2016 2:22 PM

## 2016-01-14 ENCOUNTER — Ambulatory Visit
Admission: RE | Admit: 2016-01-14 | Discharge: 2016-01-14 | Disposition: A | Discharge status: Home / Self Care | Payer: Managed Care, Other (non HMO) | Source: Ambulatory Visit | Attending: Oncology | Admitting: Oncology

## 2016-01-14 DIAGNOSIS — C50919 Malignant neoplasm of unspecified site of unspecified female breast: Secondary | ICD-10-CM

## 2016-01-14 DIAGNOSIS — R4182 Altered mental status, unspecified: Secondary | ICD-10-CM | POA: Diagnosis present

## 2016-01-14 DIAGNOSIS — C7951 Secondary malignant neoplasm of bone: Secondary | ICD-10-CM | POA: Diagnosis not present

## 2016-01-14 MED ORDER — GADOBENATE DIMEGLUMINE 529 MG/ML IV SOLN
20.0000 mL | Freq: Once | INTRAVENOUS | Status: AC | PRN
  Administered 2016-01-14: 19 mL via INTRAVENOUS

## 2016-01-18 ENCOUNTER — Inpatient Hospital Stay: Payer: Managed Care, Other (non HMO)

## 2016-01-19 ENCOUNTER — Telehealth: Payer: Self-pay | Admitting: *Deleted

## 2016-01-19 NOTE — Telephone Encounter (Signed)
Spoke with Charlesetta Ivory regarding patient's Bone Scan on the 10th and she states she will call and see if she can get her scan rescheduled. States she will call the patient back. Yolande Jolly, BSN, MHA, OCN 01/19/2016 10:29 AM

## 2016-01-19 NOTE — Telephone Encounter (Signed)
Received t/c from Cameron Proud this morning and she has questions about her new medication. States she received Letrozole in the mail from her long-term pharmacy and she had been under the impression that she would be taking something once a week. Instructed patient that this is the hormone suppression medication Dr. Grayland Ormond had discussed with her and that she should take this daily. Per Dr. Gary Fleet note, she will remain on the Letrozole until her cancer progresses. Also informed that his note says he will consider giving her Zometa for her bones in 3 months when she returns to clinic, which will be on 04/12/16. Informed Ms. Gritter that this medication will be an IV infusion. Patient states she has a bone scan scheduled next week on the 10th and requests that it be rescheduled on the 11th or later if possible. Will talk to the scheduler to see if this can be changed at patient's request. Yolande Jolly, BSN, MHA, OCN 01/19/2016 9:21 AM

## 2016-01-26 ENCOUNTER — Ambulatory Visit
Admission: RE | Admit: 2016-01-26 | Discharge: 2016-01-26 | Disposition: A | Discharge status: Home / Self Care | Payer: Managed Care, Other (non HMO) | Source: Ambulatory Visit | Attending: Oncology | Admitting: Oncology

## 2016-01-26 ENCOUNTER — Telehealth: Payer: Self-pay | Admitting: *Deleted

## 2016-01-26 ENCOUNTER — Other Ambulatory Visit: Payer: Self-pay | Admitting: *Deleted

## 2016-01-26 DIAGNOSIS — M7989 Other specified soft tissue disorders: Secondary | ICD-10-CM

## 2016-01-26 DIAGNOSIS — Z170 Estrogen receptor positive status [ER+]: Secondary | ICD-10-CM | POA: Insufficient documentation

## 2016-01-26 DIAGNOSIS — C50912 Malignant neoplasm of unspecified site of left female breast: Secondary | ICD-10-CM

## 2016-01-26 DIAGNOSIS — C50919 Malignant neoplasm of unspecified site of unspecified female breast: Secondary | ICD-10-CM

## 2016-01-26 MED ORDER — DOXYCYCLINE HYCLATE 100 MG PO TABS: 100.0000 mg | ORAL_TABLET | Freq: Two times a day (BID) | ORAL | 0 refills | Status: DC

## 2016-01-26 NOTE — Telephone Encounter (Signed)
Informed pt that ultrasound of right lower ext was negative for DVT. Per Dr. Grayland Ormond, cellulitis is suspected and will be treated with doxycycline 100mg  BID x 7 days. Prescription has been escribed to pharmacy. Pt instructed to start today and call our office back if symptoms worsen.

## 2016-01-26 NOTE — Telephone Encounter (Signed)
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 States her right foot has some swelling, particularly in the toes and on the bottom of her foot, which she states is new. She also reports the top of her right foot is warm to touch since last night and really hurting her badly since then. States she does not know what to do about it and wanted to let her Dr. Gwyndolyn Kaufman. Also reports she continues to have pain every night on her right side from her ankle to her hip, but this is not new. Dr. Gary Fleet nurse Hildred Alamin notified of patient's new complaints and she states she will let Dr. Grayland Ormond know about it. Yolande Jolly, BSN, MHA, OCN 01/26/2016  9:24 AM  Received t/c from Dr. Gary Fleet nurse Hildred Alamin that Dr. Grayland Ormond has ordered an unltrasound of the patient's right foot this morning at 11:00am. She states he wants to rule out a blood clot and will treat her with antibiotics if the U/S is negative. Hildred Alamin states she will notify the patient of her results this afternoon. T/C made back to [name] to let her know about the appointment for her ultrasound this morning at 11:00 and that she should come to the [facility] and check in prior to that and let them know she is there for an ultrasound. Also informed patient that Dr. Gary Fleet nurse Hildred Alamin will call her back this afternoon with results of the U/S. Patient states she will come on to the hospital for the ultrasound and thanked me for helping her. Yolande Jolly, BSN, MHA, OCN 01/26/2016  9:59 AM

## 2016-02-16 ENCOUNTER — Encounter: Payer: Self-pay | Admitting: Emergency Medicine

## 2016-02-16 DIAGNOSIS — Z853 Personal history of malignant neoplasm of breast: Secondary | ICD-10-CM | POA: Diagnosis not present

## 2016-02-16 DIAGNOSIS — F1290 Cannabis use, unspecified, uncomplicated: Secondary | ICD-10-CM | POA: Insufficient documentation

## 2016-02-16 DIAGNOSIS — E119 Type 2 diabetes mellitus without complications: Secondary | ICD-10-CM | POA: Diagnosis not present

## 2016-02-16 DIAGNOSIS — Z91040 Latex allergy status: Secondary | ICD-10-CM | POA: Insufficient documentation

## 2016-02-16 DIAGNOSIS — Z7984 Long term (current) use of oral hypoglycemic drugs: Secondary | ICD-10-CM | POA: Diagnosis not present

## 2016-02-16 DIAGNOSIS — I10 Essential (primary) hypertension: Secondary | ICD-10-CM | POA: Insufficient documentation

## 2016-02-16 DIAGNOSIS — Z7982 Long term (current) use of aspirin: Secondary | ICD-10-CM | POA: Insufficient documentation

## 2016-02-16 DIAGNOSIS — Z79899 Other long term (current) drug therapy: Secondary | ICD-10-CM | POA: Diagnosis not present

## 2016-02-16 DIAGNOSIS — N200 Calculus of kidney: Secondary | ICD-10-CM | POA: Insufficient documentation

## 2016-02-16 DIAGNOSIS — R109 Unspecified abdominal pain: Secondary | ICD-10-CM | POA: Diagnosis present

## 2016-02-16 LAB — COMPREHENSIVE METABOLIC PANEL
ALBUMIN: 4.8 g/dL (ref 3.5–5.0)
ALT: 31 U/L (ref 14–54)
AST: 50 U/L — AB (ref 15–41)
Alkaline Phosphatase: 58 U/L (ref 38–126)
Anion gap: 9 (ref 5–15)
BUN: 16 mg/dL (ref 6–20)
CHLORIDE: 102 mmol/L (ref 101–111)
CO2: 27 mmol/L (ref 22–32)
Calcium: 9.3 mg/dL (ref 8.9–10.3)
Creatinine, Ser: 1 mg/dL (ref 0.44–1.00)
GFR calc Af Amer: >60 mL/min (ref >60)
GFR calc non Af Amer: 58 mL/min — ABNORMAL LOW (ref >60)
GLUCOSE: 153 mg/dL — AB (ref 65–99)
POTASSIUM: 3.5 mmol/L (ref 3.5–5.1)
Sodium: 138 mmol/L (ref 135–145)
Total Bilirubin: 0.5 mg/dL (ref 0.3–1.2)
Total Protein: 7.6 g/dL (ref 6.5–8.1)

## 2016-02-16 LAB — URINALYSIS, COMPLETE (UACMP) WITH MICROSCOPIC
BACTERIA UA: NONE SEEN
Bilirubin Urine: NEGATIVE
Glucose, UA: NEGATIVE mg/dL
Ketones, ur: 5 mg/dL — AB
Nitrite: NEGATIVE
PROTEIN: 30 mg/dL — AB
Specific Gravity, Urine: 1.02 (ref 1.005–1.030)
pH: 5 (ref 5.0–8.0)

## 2016-02-16 LAB — CBC
HEMATOCRIT: 38.7 % (ref 35.0–47.0)
Hemoglobin: 13.2 g/dL (ref 12.0–16.0)
MCH: 31.4 pg (ref 26.0–34.0)
MCHC: 34 g/dL (ref 32.0–36.0)
MCV: 92.4 fL (ref 80.0–100.0)
PLATELETS: 263 10*3/uL (ref 150–440)
RBC: 4.18 MIL/uL (ref 3.80–5.20)
RDW: 14.6 % — AB (ref 11.5–14.5)
WBC: 8.1 10*3/uL (ref 3.6–11.0)

## 2016-02-16 NOTE — ED Triage Notes (Signed)
Pt to ED from home c/o n/v and left flank pain that came on suddenly around 6pm today.  Pt states vomited once on the way to ED.  Pt states hx of breast cancer and had chemo/radiation in December.

## 2016-02-17 ENCOUNTER — Emergency Department: Payer: Managed Care, Other (non HMO)

## 2016-02-17 ENCOUNTER — Emergency Department
Admission: EM | Admit: 2016-02-17 | Discharge: 2016-02-17 | Disposition: A | Discharge status: Home / Self Care | Payer: Managed Care, Other (non HMO) | Attending: Emergency Medicine | Admitting: Emergency Medicine

## 2016-02-17 DIAGNOSIS — N200 Calculus of kidney: Secondary | ICD-10-CM

## 2016-02-17 MED ORDER — ONDANSETRON 4 MG PO TBDP: 4.0000 mg | ORAL_TABLET | Freq: Three times a day (TID) | ORAL | 0 refills | Status: DC | PRN

## 2016-02-17 MED ORDER — ONDANSETRON HCL 4 MG/2ML IJ SOLN
4.0000 mg | Freq: Once | INTRAMUSCULAR | Status: AC
  Administered 2016-02-17: 4 mg via INTRAVENOUS
  Filled 2016-02-17: qty 2

## 2016-02-17 MED ORDER — OXYCODONE-ACETAMINOPHEN 5-325 MG PO TABS: 1.0000 | ORAL_TABLET | Freq: Four times a day (QID) | ORAL | 0 refills | Status: AC | PRN

## 2016-02-17 MED ORDER — OXYCODONE-ACETAMINOPHEN 5-325 MG PO TABS
1.0000 | ORAL_TABLET | Freq: Once | ORAL | Status: AC
  Administered 2016-02-17: 1 via ORAL
  Filled 2016-02-17: qty 1

## 2016-02-17 MED ORDER — SULFAMETHOXAZOLE-TRIMETHOPRIM 800-160 MG PO TABS: 1.0000 | ORAL_TABLET | Freq: Two times a day (BID) | ORAL | 0 refills | Status: AC

## 2016-02-17 MED ORDER — FENTANYL CITRATE (PF) 100 MCG/2ML IJ SOLN
25.0000 ug | Freq: Once | INTRAMUSCULAR | Status: AC
  Administered 2016-02-17: 25 ug via INTRAVENOUS
  Filled 2016-02-17: qty 2

## 2016-02-17 NOTE — ED Provider Notes (Signed)
[facility] Emergency Department Provider Note   First MD Initiated Contact with Patient 02/17/16 0020     (approximate)  I have reviewed the triage vital signs and the nursing notes.   HISTORY  Chief Complaint Flank Pain and Nausea   HPI Amanda [name] is a 66 y.o. female with below list of chronic medical conditions including metastatic breast cancer (lesion in all 4). Patient presents to the emergency department with acute onset of left flank pain nausea and vomiting which started at 6 PM yesterday. Patient states pain fluctuates from a 5-10. Current pain score is 8 out of 10. Patient denies any fever or febrile on presentation temperature 98.5. Patient denies any hematuria or dysuria.   Past Medical History:  Diagnosis Date  . Back pain    occasionally  . Breast cancer ([place])    left  . Depression    takes Paxil and Wellbutrin daily  . Diabetes ([place])    takes Metformin daily  . GERD (gastroesophageal reflux disease)   . History of bronchitis 2 yrs ago  . Hyperlipidemia    takes Atorvastatin daily  . Hypertension    takes Losartan-HCTZ daily  . Insomnia    takes Ambien nightly  . Insomnia    takes gabapentin nightly  . Joint pain   . Migraine   . Mood swings ([place])   . Osteoarthritis of knee   . PONV (postoperative nausea and vomiting)   . Seasonal allergies    takes Allegra daily    Patient Active Problem List   Diagnosis Date Noted  . Metastatic breast cancer ([place]) 09/24/2015  . 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 Personal history of breast cancer 08/23/2015  . 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 Degenerative arthritis of right shoulder region 01/13/2015  . Cancer of left female breast  ([place]) 07/27/2014  . 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 Rotator cuff tear 06/10/2014  . Allergic rhinitis, seasonal 09/16/2013  . Depression 08/10/2013  . Diabetes  mellitus type 2, uncomplicated ([place]) [id number]  . Hyperlipidemia, unspecified 08/10/2013  . BP (high blood pressure) 08/10/2013  . 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 BREAST BIOPSY  2009  . cataract surgery Bilateral   . COLONOSCOPY    . JOINT REPLACEMENT Left 2014   knee  . KNEE ARTHROSCOPY Left    x 5  . MASTECTOMY Left   . port a cath placed    . PORTACATH PLACEMENT N/A 09/17/2015   Procedure: INSERTION PORT-A-CATH;  Surgeon: Jules Husbands, MD;  Location: [facility] OR[facility];  Service: General;  Laterality: N/A;  . TOTAL SHOULDER ARTHROPLASTY Right 06/03/2015   Procedure: TOTAL SHOULDER ARTHROPLASTY;  Surgeon: Tania Ade, MD;  Location: [facility];  Service: Orthopedics;  Laterality: Right;  Right total shoulder arthroplasty  . TOTAL SHOULDER REPLACEMENT Right 06/03/2015  . TUBAL LIGATION      Prior to Admission medications   Medication Sig Start Date End Date Taking? Authorizing Provider  aspirin EC 81 MG tablet Take 81 mg by mouth daily.     Historical Provider, MD  atorvastatin (LIPITOR) 10 MG tablet Take 10 mg by mouth daily at 6 PM.  08/08/13   Historical Provider, MD  b complex vitamins capsule Take 1 capsule by mouth daily.    Historical Provider, MD  buPROPion (WELLBUTRIN XL) 150 MG 24 hr tablet Take 150 mg by mouth  every morning.  08/08/13   Historical Provider, MD  Calcium Carbonate-Vit D-Min (CALCIUM 600+D PLUS MINERALS) 600-400 MG-UNIT TABS Take 1 tablet by mouth 2 (two) times daily.     Historical Provider, MD  chlorpheniramine-HYDROcodone (TUSSIONEX) 10-8 MG/5ML SUER Take 5 mLs by mouth every 12 (twelve) hours as needed for cough. 12/06/15   Lloyd Huger, MD  doxycycline (VIBRA-TABS) 100 MG tablet Take 1 tablet (100 mg total) by mouth 2 (two) times daily. 01/26/16   Lloyd Huger, MD  fexofenadine (ALLEGRA) 180 MG tablet Take 180 mg by mouth at bedtime.     Historical Provider, MD  gabapentin (NEURONTIN) 300  MG capsule Take 300 mg by mouth at bedtime. 05/25/14   Historical Provider, MD  HYDROcodone-acetaminophen (NORCO/VICODIN) 5-325 MG tablet Take 1-2 tablets by mouth every 6 (six) hours as needed for moderate pain. 11/23/15   Noreene Filbert, MD  letrozole [facility]) 2.5 MG tablet Take 1 tablet (2.5 mg total) by mouth daily. 01/11/16   Lloyd Huger, MD  lidocaine-prilocaine (EMLA) cream Apply 1 application topically as needed. Apply to port 1-2 hours prior to chemotherapy appointment. Cover with plastic wrap. 09/27/15   Lloyd Huger, MD  losartan-hydrochlorothiazide [facility]) 100-25 MG per tablet Take 1 tablet by mouth every morning.  07/27/14   Historical Provider, MD  metFORMIN (GLUCOPHAGE) 850 MG tablet Take 850 mg by mouth 2 (two) times daily with a meal.     Historical Provider, MD  PARoxetine (PAXIL) 40 MG tablet Take 40 mg by mouth every morning.  08/08/13   Historical Provider, MD  traMADol (ULTRAM) 50 MG tablet Take 1 tablet (50 mg total) by mouth 2 (two) times daily. 09/13/15   Lloyd Huger, MD  Zolpidem Tartrate (AMBIEN PO) Take by mouth.    Historical Provider, MD    Allergies Ace inhibitors; Latex; Morphine and related; Other; and Penicillins  Family History  Problem Relation Age of Onset  . Atrial fibrillation Mother   . Non-Hodgkin's lymphoma Father   . Breast cancer Maternal Aunt     x2  . Heart attack Maternal Uncle   . Melanoma Maternal Uncle   . Breast cancer Paternal Aunt     x2    Social History Social History  Substance Use Topics  . Smoking status: Never Smoker  . Smokeless tobacco: Never Used  . Alcohol use 0.0 oz/week     Comment: occasionally wine    Review of Systems Constitutional: No fever/chills Eyes: No visual changes. ENT: No sore throat. Cardiovascular: Denies chest pain. Respiratory: Denies shortness of breath. Gastrointestinal: Operative for left flank pain and vomiting.  Genitourinary: Negative for dysuria. Musculoskeletal: Negative  for back pain. Skin: Negative for rash. Neurological: Negative for headaches, focal weakness or numbness.  10-point ROS otherwise negative.  ____________________________________________   PHYSICAL EXAM:  VITAL SIGNS: ED Triage Vitals [02/16/16 2055]  Enc Vitals Group     BP (!) 163/107     Pulse Rate 91     Resp 16     Temp 98.5 F (36.9 C)     Temp Source Oral     SpO2 98 %     Weight 205 lb (93 kg)     Height 5\' 4"  (1.626 m)     Head Circumference      Peak Flow      Pain Score 4     Pain Loc      Pain Edu?      Excl. in [place]?  Constitutional: Alert and oriented. Well appearing and in no acute distress. Eyes: Conjunctivae are normal. PERRL. EOMI. Head: Atraumatic. Mouth/Throat: Mucous membranes are moist.  Oropharynx non-erythematous. Neck: No stridor.   Cardiovascular: Normal rate, regular rhythm. Good peripheral circulation. Grossly normal heart sounds. Respiratory: Normal respiratory effort.  No retractions. Lungs CTAB. Gastrointestinal: Soft and nontender. No distention.  Musculoskeletal: No lower extremity tenderness nor edema. No gross deformities of extremities. Neurologic:  Normal speech and language. No gross focal neurologic deficits are appreciated.  Skin:  Skin is warm, dry and intact. No rash noted. Psychiatric: Mood and affect are normal. Speech and behavior are normal.  ____________________________________________   LABS (all labs ordered are listed, but only abnormal results are displayed)  Labs Reviewed  URINALYSIS, COMPLETE (UACMP) WITH MICROSCOPIC - Abnormal; Notable for the following:       Result Value   APPearance CLOUDY (*)    Hgb urine dipstick LARGE (*)    Ketones, ur 5 (*)    Protein, ur 30 (*)    Leukocytes, UA MODERATE (*)    Squamous Epithelial / LPF 0-5 (*)    All other components within normal limits  CBC - Abnormal; Notable for the following:    RDW 14.6 (*)    All other components within normal limits  COMPREHENSIVE  METABOLIC PANEL - Abnormal; Notable for the following:    Glucose, Bld 153 (*)    AST 50 (*)    GFR calc non Af Amer 58 (*)    All other components within normal limits    RADIOLOGY I, [place], personally viewed and evaluated these images (plain radiographs) as part of my medical decision making, as well as reviewing the written report by the radiologist.  Ct Renal Stone Study  Result Date: 02/17/2016 CLINICAL DATA:  Sudden onset of left flank pain.  Hematuria. EXAM: CT ABDOMEN AND PELVIS WITHOUT CONTRAST TECHNIQUE: Multidetector CT imaging of the abdomen and pelvis was performed following the standard protocol without IV contrast. COMPARISON:  CT 12/27/2015 FINDINGS: Lower chest: Lingular scarring. Minimal ground-glass opacities in the right lung are unchanged from prior CT. No new consolidation or pleural fluid. Hepatobiliary: Decreased hepatic density consistent with steatosis. No evidence of focal lesion allowing for noncontrast exam. Gallbladder physiologically distended, no calcified stone. No biliary dilatation. Pancreas: No ductal dilatation or inflammation. Spleen: Normal in size without focal abnormality. Adrenals/Urinary Tract: Punctate 2 mm obstructing stone at the left ureterovesicular junction with moderate left hydroureteronephrosis and mild perinephric edema. No additional nonobstructing stones in the left kidney. There is a 3 mm nonobstructing stone in the lower right kidney, no right hydronephrosis. No adrenal nodule. Urinary bladder is decompressed without wall thickening. Stomach/Bowel: Sigmoid colonic diverticulosis without acute inflammation. Small hiatal hernia. No bowel wall thickening, distention or inflammation. Vascular/Lymphatic: Mild aortic and branch atherosclerosis, no aneurysm. No adenopathy. Reproductive: Uterus and bilateral adnexa are unremarkable. Other: No free air, free fluid, or intra-abdominal fluid collection. Musculoskeletal: Sclerotic osseous metastatic  disease within the lumbar spine, sacrum and left iliac bone. No definite new osseous lesion. Degenerative change in the spine. IMPRESSION: 1. Obstructing 2 mm stone at the left ureterovesicular junction with moderate left hydronephrosis and mild perinephric edema. 2. Nonobstructing stone in the lower right kidney. 3. Incidental aortic atherosclerosis without aneurysm. Colonic diverticulosis without diverticulitis. 4. Grossly stable osseous metastatic disease. Electronically Signed   By: Jeb Levering M.D.   On: 02/17/2016 01:18      Procedures     INITIAL IMPRESSION /  ASSESSMENT AND PLAN / ED COURSE  Pertinent labs & imaging results that were available during my care of the patient were reviewed by me and considered in my medical decision making (see chart for details).  66 year old female presenting with acute onset of left flank pain at 6 PM last night. History of physical exam concerning for possible kidney stone urinalysis revealed hematuria. CT scan of the abdomen and pelvis revealed obstructing 2 mm left UVJ stone. Patient is afebrile.      ____________________________________________  FINAL CLINICAL IMPRESSION(S) / ED DIAGNOSES  Final diagnoses:  Kidney stone on left side     MEDICATIONS GIVEN DURING THIS VISIT:  Medications  fentaNYL (SUBLIMAZE) injection 25 mcg (25 mcg Intravenous Given 02/17/16 0110)  ondansetron (ZOFRAN) injection 4 mg (4 mg Intravenous Given 02/17/16 0108)     NEW OUTPATIENT MEDICATIONS STARTED DURING THIS VISIT:  New Prescriptions   No medications on file    Modified Medications   No medications on file    Discontinued Medications   No medications on file     Note:  This document was prepared using Dragon voice recognition software and may include unintentional dictation errors.    Gregor Hams, MD 02/17/16 [phone]

## 2016-02-21 ENCOUNTER — Inpatient Hospital Stay: Payer: Managed Care, Other (non HMO) | Attending: Oncology

## 2016-02-21 ENCOUNTER — Telehealth: Payer: Self-pay | Admitting: *Deleted

## 2016-02-21 DIAGNOSIS — Z170 Estrogen receptor positive status [ER+]: Secondary | ICD-10-CM | POA: Insufficient documentation

## 2016-02-21 DIAGNOSIS — G629 Polyneuropathy, unspecified: Secondary | ICD-10-CM

## 2016-02-21 DIAGNOSIS — C50912 Malignant neoplasm of unspecified site of left female breast: Secondary | ICD-10-CM

## 2016-02-21 DIAGNOSIS — Z452 Encounter for adjustment and management of vascular access device: Secondary | ICD-10-CM | POA: Insufficient documentation

## 2016-02-21 MED ORDER — HEPARIN SOD (PORK) LOCK FLUSH 100 UNIT/ML IV SOLN
500.0000 [IU] | Freq: Once | INTRAVENOUS | Status: AC
  Administered 2016-02-21: 500 [IU] via INTRAVENOUS
  Filled 2016-02-21: qty 5

## 2016-02-21 MED ORDER — SODIUM CHLORIDE 0.9% FLUSH
10.0000 mL | INTRAVENOUS | Status: DC | PRN
  Administered 2016-02-21: 10 mL via INTRAVENOUS
  Filled 2016-02-21: qty 10

## 2016-02-21 NOTE — Telephone Encounter (Signed)
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 Mostly in r foot. Not sure what to do about it. She takes Gabapentin 600 mg at HS for past 6 months. The Oxycodone she is taking for a kidney stone is not really helping her foot pain much.  Please advise

## 2016-02-21 NOTE — Telephone Encounter (Signed)
Per VO Dr Grayland Ormond increase Gabapentin to 600 mg bid and refer to Neurology. Patient advised of increase in medication and to expect a call regarding Neurology appt. Message sent to schedulers and order entered

## 2016-02-22 ENCOUNTER — Ambulatory Visit (INDEPENDENT_AMBULATORY_CARE_PROVIDER_SITE_OTHER): Payer: Managed Care, Other (non HMO) | Admitting: Urology

## 2016-02-22 ENCOUNTER — Encounter: Payer: Self-pay | Admitting: Urology

## 2016-02-22 VITALS — BP 115/73 | HR 84 | Ht 64.0 in | Wt 202.0 lb

## 2016-02-22 DIAGNOSIS — N200 Calculus of kidney: Secondary | ICD-10-CM

## 2016-02-22 DIAGNOSIS — N201 Calculus of ureter: Secondary | ICD-10-CM | POA: Diagnosis not present

## 2016-02-22 LAB — URINALYSIS, COMPLETE
Bilirubin, UA: NEGATIVE
Glucose, UA: NEGATIVE
KETONES UA: NEGATIVE
NITRITE UA: NEGATIVE
PH UA: 7 (ref 5.0–7.5)
RBC UA: NEGATIVE
Specific Gravity, UA: 1.025 (ref 1.005–1.030)
Urobilinogen, Ur: 0.2 mg/dL (ref 0.2–1.0)

## 2016-02-22 LAB — MICROSCOPIC EXAMINATION: Bacteria, UA: NONE SEEN

## 2016-02-22 NOTE — Progress Notes (Signed)
02/22/2016 8:18 PM   Amanda [name] Amanda [name] [birth year] [id number]  Referring provider: Sofie Hartigan, MD [address] [place], [place] [address]  Chief Complaint  Patient presents with  . Nephrolithiasis    New Patient    HPI: 66 year old female who presents to the office for further evaluation of a 2 mm left distal ureteral stone.  She developed acute onset of sudden and severe left flank pain on 02/17/2016 with associated nausea which led to an emergency room evaluation. In the emergency room, noncontrast CT scan showed a 2 mm left UVJ stone with proximal hydroureteronephrosis and perinephric stranding. She also had some punctate right lower pole calculi.  Urinalysis in the emergency room showed too numerous to count red blood cells, 6-30 white blood cells, otherwise unremarkable.  She had no leukocytosis is otherwise hemodynamically stable.  She's not had any recurrence of her pain since that visit. UA today shows no residual microscopic hematuria.  She denies any prior history of kidney stones. She admits to not drinking enough water recently especially undergoing chemotherapy.  Medical comorbidities include history of stage IV inflammatory breast carcinoma who recently completed chemotherapy managed by Dr. Grayland Ormond at [facility] in [place].   PMH: Past Medical History:  Diagnosis Date  . Back pain    occasionally  . Breast cancer ([place])    left  . Depression    takes Paxil and Wellbutrin daily  . Diabetes ([place])    takes Metformin daily  . GERD (gastroesophageal reflux disease)   . History of bronchitis 2 yrs ago  . Hyperlipidemia    takes Atorvastatin daily  . Hypertension    takes Losartan-HCTZ daily  . Insomnia    takes Ambien nightly  . Insomnia    takes gabapentin nightly  . Joint pain   . Migraine   . Mood swings ([place])   . Osteoarthritis of knee   . PONV (postoperative nausea and vomiting)   . Seasonal allergies      takes Allegra daily    Surgical History: Past Surgical History:  Procedure Laterality Date  . BREAST BIOPSY  2009  . cataract surgery Bilateral   . COLONOSCOPY    . JOINT REPLACEMENT Left 2014   knee  . KNEE ARTHROSCOPY Left    x 5  . MASTECTOMY Left   . port a cath placed    . PORTACATH PLACEMENT N/A 09/17/2015   Procedure: INSERTION PORT-A-CATH;  Surgeon: Jules Husbands, MD;  Location: [facility];  Service: General;  Laterality: N/A;  . TOTAL SHOULDER ARTHROPLASTY Right 06/03/2015   Procedure: TOTAL SHOULDER ARTHROPLASTY;  Surgeon: Tania Ade, MD;  Location: [facility];  Service: Orthopedics;  Laterality: Right;  Right total shoulder arthroplasty  . TOTAL SHOULDER REPLACEMENT Right 06/03/2015  . TUBAL LIGATION      Home Medications:  Allergies as of 02/22/2016      Reactions   Ace Inhibitors Cough   Latex Itching   Morphine And Related Itching   Caused her to itch terribly. Would prefer if given to take with a benadryl   Other Itching   Freeze spray. Patient stated that she may be able to use it now because she doesn't use it as often.   Penicillins Rash   Has patient had a PCN reaction causing immediate rash, facial/tongue/throat swelling, SOB or lightheadedness with hypotension: No Has patient had a PCN reaction causing severe rash involving mucus membranes or skin necrosis: No Has patient  had a PCN reaction that required hospitalization No Has patient had a PCN reaction occurring within the last 10 years: No If all of the above answers are "NO", then may proceed with Cephalosporin use.      Medication List       Accurate as of 02/22/16  8:18 PM. Always use your most recent med list.          AMBIEN PO Take by mouth.   aspirin EC 81 MG tablet Take 81 mg by mouth daily.   atorvastatin 10 MG tablet Commonly known as:  LIPITOR Take 10 mg by mouth daily at 6 PM.   b complex vitamins capsule Take 1 capsule by mouth daily.   buPROPion 150 MG 24 hr tablet Commonly  known as:  WELLBUTRIN XL Take 150 mg by mouth every morning.   CALCIUM 600+D PLUS MINERALS 600-400 MG-UNIT Tabs Take 1 tablet by mouth 2 (two) times daily.   fexofenadine 180 MG tablet Commonly known as:  ALLEGRA Take 180 mg by mouth at bedtime.   gabapentin 300 MG capsule Commonly known as:  NEURONTIN Take 600 mg by mouth 2 (two) times daily.   letrozole 2.5 MG tablet Commonly known as:  FEMARA Take 1 tablet (2.5 mg total) by mouth daily.   lidocaine-prilocaine cream Commonly known as:  EMLA Apply 1 application topically as needed. Apply to port 1-2 hours prior to chemotherapy appointment. Cover with plastic wrap.   losartan-hydrochlorothiazide 100-25 MG tablet Commonly known as:  HYZAAR Take 1 tablet by mouth every morning.   metFORMIN 850 MG tablet Commonly known as:  GLUCOPHAGE Take 850 mg by mouth 2 (two) times daily with a meal.   ondansetron 4 MG disintegrating tablet Commonly known as:  ZOFRAN ODT Take 1 tablet (4 mg total) by mouth every 8 (eight) hours as needed for nausea or vomiting.   oxyCODONE-acetaminophen 5-325 MG tablet Commonly known as:  ROXICET Take 1 tablet by mouth every 6 (six) hours as needed.   PARoxetine 40 MG tablet Commonly known as:  PAXIL Take 40 mg by mouth every morning.   sulfamethoxazole-trimethoprim 800-160 MG tablet Commonly known as:  BACTRIM DS,SEPTRA DS Take 1 tablet by mouth 2 (two) times daily.       Allergies:  Allergies  Allergen Reactions  . Ace Inhibitors Cough  . Latex Itching  . Morphine And Related Itching    Caused her to itch terribly. Would prefer if given to take with a benadryl  . Other Itching    Freeze spray. Patient stated that she may be able to use it now because she doesn't use it as often.  Marland Kitchen Penicillins Rash    Has patient had a PCN reaction causing immediate rash, facial/tongue/throat swelling, SOB or lightheadedness with hypotension: No Has patient had a PCN reaction causing severe rash  involving mucus membranes or skin necrosis: No Has patient had a PCN reaction that required hospitalization No Has patient had a PCN reaction occurring within the last 10 years: No If all of the above answers are "NO", then may proceed with Cephalosporin use.    Family History: Family History  Problem Relation Age of Onset  . Atrial fibrillation Mother   . Non-Hodgkin's lymphoma Father   . Breast cancer Maternal Aunt     x2  . Heart attack Maternal Uncle   . Melanoma Maternal Uncle   . Breast cancer Paternal Aunt     x2    Social History:  reports that she has  never smoked. She has never used smokeless tobacco. She reports that she drinks alcohol. She reports that she uses drugs, including Marijuana.  ROS: 12 point review of systems was performed.  This was positive for peripheral neuropathy/foot pain with a was as per history of present illness.  Physical Exam: BP 115/73   Pulse 84   Ht 5\' 4"  (1.626 m)   Wt 202 lb (91.6 kg)   BMI 34.67 kg/m   Constitutional:  Alert and oriented, No acute distress. HEENT: [place] AT, moist mucus membranes.  Trachea midline, no masses. Cardiovascular: No clubbing, cyanosis, or edema. Respiratory: Normal respiratory effort, no increased work of breathing. GI: Abdomen is soft, nontender, nondistended, no abdominal masses.  Obese. GU: No CVA tenderness.  Skin: No rashes, bruises or suspicious lesions.  Her loss appreciated recent chemotherapy. Lymph: No cervical or inguinal adenopathy. Neurologic: Grossly intact, no focal deficits, moving all 4 extremities. Psychiatric: Normal mood and affect.  Laboratory Data: Lab Results  Component Value Date   WBC 8.1 02/16/2016   HGB 13.2 02/16/2016   HCT 38.7 02/16/2016   MCV 92.4 02/16/2016   PLT 263 02/16/2016    Lab Results  Component Value Date   CREATININE 1.00 02/16/2016     Lab Results  Component Value Date   HGBA1C 6.5 (H) 05/26/2015    Urinalysis Results for orders placed or  performed in visit on 02/22/16  Microscopic Examination  Result Value Ref Range   WBC, UA 6-10 (A) 0 - 5 /hpf   RBC, UA 0-2 0 - 2 /hpf   Epithelial Cells (non renal) 0-10 0 - 10 /hpf   Bacteria, UA None seen None seen/Few  Urinalysis, Complete  Result Value Ref Range   Specific Gravity, UA 1.025 1.005 - 1.030   pH, UA 7.0 5.0 - 7.5   Color, UA Yellow Yellow   Appearance Ur Clear Clear   Leukocytes, UA Trace (A) Negative   Protein, UA Trace (A) Negative/Trace   Glucose, UA Negative Negative   Ketones, UA Negative Negative   RBC, UA Negative Negative   Bilirubin, UA Negative Negative   Urobilinogen, Ur 0.2 0.2 - 1.0 mg/dL   Nitrite, UA Negative Negative   Microscopic Examination See below:     Pertinent Imaging: CLINICAL DATA:  Sudden onset of left flank pain.  Hematuria.  EXAM: CT ABDOMEN AND PELVIS WITHOUT CONTRAST  TECHNIQUE: Multidetector CT imaging of the abdomen and pelvis was performed following the standard protocol without IV contrast.  COMPARISON:  CT 12/27/2015  FINDINGS: Lower chest: Lingular scarring. Minimal ground-glass opacities in the right lung are unchanged from prior CT. No new consolidation or pleural fluid.  Hepatobiliary: Decreased hepatic density consistent with steatosis. No evidence of focal lesion allowing for noncontrast exam. Gallbladder physiologically distended, no calcified stone. No biliary dilatation.  Pancreas: No ductal dilatation or inflammation.  Spleen: Normal in size without focal abnormality.  Adrenals/Urinary Tract: Punctate 2 mm obstructing stone at the left ureterovesicular junction with moderate left hydroureteronephrosis and mild perinephric edema. No additional nonobstructing stones in the left kidney. There is a 3 mm nonobstructing stone in the lower right kidney, no right hydronephrosis. No adrenal nodule. Urinary bladder is decompressed without wall thickening.  Stomach/Bowel: Sigmoid colonic  diverticulosis without acute inflammation. Small hiatal hernia. No bowel wall thickening, distention or inflammation.  Vascular/Lymphatic: Mild aortic and branch atherosclerosis, no aneurysm. No adenopathy.  Reproductive: Uterus and bilateral adnexa are unremarkable.  Other: No free air, free fluid, or intra-abdominal fluid collection.  Musculoskeletal: Sclerotic osseous metastatic disease within the lumbar spine, sacrum and left iliac bone. No definite new osseous lesion. Degenerative change in the spine.  IMPRESSION: 1. Obstructing 2 mm stone at the left ureterovesicular junction with moderate left hydronephrosis and mild perinephric edema. 2. Nonobstructing stone in the lower right kidney. 3. Incidental aortic atherosclerosis without aneurysm. Colonic diverticulosis without diverticulitis. 4. Grossly stable osseous metastatic disease.   Electronically Signed   By: Jeb Levering M.D.   On: 02/17/2016 01:18    CT scan personally reviewed today and with the patient.  Assessment & Plan:    1. Left ureteral stone 2 mm left UVJ stone, suspect she has passed her absence of pain,residual microscopic hematuria ,and very small size and location of the stone on initial CTscan. Advised to return of pain recurs  2. Nephrolithiasis Incidental punctate RLP stone  We discussed general stone prevention techniques including drinking plenty water with goal of producing 2.5 L urine daily, increased citric acid intake, avoidance of high oxalate containing foods, and decreased salt intake.  Information about dietary recommendations given today.   - Urinalysis, Complete  Follow up as needed  Hollice Espy, MD  [facility] [address], [place], [place] [address] [phone]

## 2016-03-07 DIAGNOSIS — T451X5A Adverse effect of antineoplastic and immunosuppressive drugs, initial encounter: Secondary | ICD-10-CM

## 2016-03-07 DIAGNOSIS — G620 Drug-induced polyneuropathy: Secondary | ICD-10-CM | POA: Insufficient documentation

## 2016-03-30 ENCOUNTER — Telehealth: Payer: Self-pay | Admitting: *Deleted

## 2016-03-30 NOTE — Telephone Encounter (Signed)
Patient advised ok to call plastic surgeon

## 2016-03-30 NOTE — Telephone Encounter (Signed)
Yes she can.

## 2016-03-30 NOTE — Telephone Encounter (Signed)
Asking if she is alright to see plastic surgeon. She is having pain in left side shoulder and under her arm. Please advise

## 2016-04-06 DIAGNOSIS — C50912 Malignant neoplasm of unspecified site of left female breast: Secondary | ICD-10-CM | POA: Insufficient documentation

## 2016-04-10 ENCOUNTER — Ambulatory Visit
Admission: RE | Admit: 2016-04-10 | Discharge: 2016-04-10 | Disposition: A | Discharge status: Home / Self Care | Payer: 59 | Source: Ambulatory Visit | Attending: Oncology | Admitting: Oncology

## 2016-04-10 ENCOUNTER — Other Ambulatory Visit: Payer: Managed Care, Other (non HMO)

## 2016-04-10 ENCOUNTER — Inpatient Hospital Stay: Payer: 59 | Attending: Oncology

## 2016-04-10 DIAGNOSIS — M549 Dorsalgia, unspecified: Secondary | ICD-10-CM | POA: Insufficient documentation

## 2016-04-10 DIAGNOSIS — Z9012 Acquired absence of left breast and nipple: Secondary | ICD-10-CM | POA: Insufficient documentation

## 2016-04-10 DIAGNOSIS — N200 Calculus of kidney: Secondary | ICD-10-CM | POA: Insufficient documentation

## 2016-04-10 DIAGNOSIS — K219 Gastro-esophageal reflux disease without esophagitis: Secondary | ICD-10-CM | POA: Diagnosis not present

## 2016-04-10 DIAGNOSIS — G4700 Insomnia, unspecified: Secondary | ICD-10-CM | POA: Insufficient documentation

## 2016-04-10 DIAGNOSIS — Z170 Estrogen receptor positive status [ER+]: Secondary | ICD-10-CM | POA: Insufficient documentation

## 2016-04-10 DIAGNOSIS — Z79899 Other long term (current) drug therapy: Secondary | ICD-10-CM | POA: Diagnosis not present

## 2016-04-10 DIAGNOSIS — F329 Major depressive disorder, single episode, unspecified: Secondary | ICD-10-CM | POA: Diagnosis not present

## 2016-04-10 DIAGNOSIS — Z807 Family history of other malignant neoplasms of lymphoid, hematopoietic and related tissues: Secondary | ICD-10-CM | POA: Insufficient documentation

## 2016-04-10 DIAGNOSIS — M1990 Unspecified osteoarthritis, unspecified site: Secondary | ICD-10-CM | POA: Insufficient documentation

## 2016-04-10 DIAGNOSIS — C7951 Secondary malignant neoplasm of bone: Secondary | ICD-10-CM | POA: Diagnosis not present

## 2016-04-10 DIAGNOSIS — M2550 Pain in unspecified joint: Secondary | ICD-10-CM | POA: Diagnosis not present

## 2016-04-10 DIAGNOSIS — C50912 Malignant neoplasm of unspecified site of left female breast: Secondary | ICD-10-CM | POA: Insufficient documentation

## 2016-04-10 DIAGNOSIS — I700 Atherosclerosis of aorta: Secondary | ICD-10-CM | POA: Insufficient documentation

## 2016-04-10 DIAGNOSIS — G629 Polyneuropathy, unspecified: Secondary | ICD-10-CM | POA: Diagnosis not present

## 2016-04-10 DIAGNOSIS — M8580 Other specified disorders of bone density and structure, unspecified site: Secondary | ICD-10-CM | POA: Insufficient documentation

## 2016-04-10 DIAGNOSIS — Z803 Family history of malignant neoplasm of breast: Secondary | ICD-10-CM | POA: Insufficient documentation

## 2016-04-10 DIAGNOSIS — I10 Essential (primary) hypertension: Secondary | ICD-10-CM | POA: Diagnosis not present

## 2016-04-10 DIAGNOSIS — Z7982 Long term (current) use of aspirin: Secondary | ICD-10-CM | POA: Diagnosis not present

## 2016-04-10 DIAGNOSIS — C50919 Malignant neoplasm of unspecified site of unspecified female breast: Secondary | ICD-10-CM

## 2016-04-10 DIAGNOSIS — E785 Hyperlipidemia, unspecified: Secondary | ICD-10-CM | POA: Insufficient documentation

## 2016-04-10 DIAGNOSIS — Z8669 Personal history of other diseases of the nervous system and sense organs: Secondary | ICD-10-CM | POA: Diagnosis not present

## 2016-04-10 DIAGNOSIS — Z7984 Long term (current) use of oral hypoglycemic drugs: Secondary | ICD-10-CM | POA: Insufficient documentation

## 2016-04-10 DIAGNOSIS — Z95828 Presence of other vascular implants and grafts: Secondary | ICD-10-CM

## 2016-04-10 LAB — CBC WITH DIFFERENTIAL/PLATELET
BASOS ABS: 0 10*3/uL (ref 0–0.1)
Basophils Relative: 1 %
Eosinophils Absolute: 0.4 10*3/uL (ref 0–0.7)
Eosinophils Relative: 12 %
HEMATOCRIT: 36.6 % (ref 35.0–47.0)
Hemoglobin: 12.1 g/dL (ref 12.0–16.0)
LYMPHS ABS: 1.1 10*3/uL (ref 1.0–3.6)
LYMPHS PCT: 31 %
MCH: 30.2 pg (ref 26.0–34.0)
MCHC: 33.2 g/dL (ref 32.0–36.0)
MCV: 90.9 fL (ref 80.0–100.0)
MONO ABS: 0.6 10*3/uL (ref 0.2–0.9)
Monocytes Relative: 16 %
NEUTROS ABS: 1.5 10*3/uL (ref 1.4–6.5)
Neutrophils Relative %: 40 %
Platelets: 235 10*3/uL (ref 150–440)
RBC: 4.03 MIL/uL (ref 3.80–5.20)
RDW: 14.8 % — AB (ref 11.5–14.5)
WBC: 3.7 10*3/uL (ref 3.6–11.0)

## 2016-04-10 LAB — COMPREHENSIVE METABOLIC PANEL
ALT: 25 U/L (ref 14–54)
ANION GAP: 7 (ref 5–15)
AST: 39 U/L (ref 15–41)
Albumin: 3.7 g/dL (ref 3.5–5.0)
Alkaline Phosphatase: 52 U/L (ref 38–126)
BILIRUBIN TOTAL: 0.5 mg/dL (ref 0.3–1.2)
BUN: 18 mg/dL (ref 6–20)
CALCIUM: 9.1 mg/dL (ref 8.9–10.3)
CO2: 28 mmol/L (ref 22–32)
Chloride: 100 mmol/L — ABNORMAL LOW (ref 101–111)
Creatinine, Ser: 0.95 mg/dL (ref 0.44–1.00)
GFR calc Af Amer: >60 mL/min (ref >60)
GLUCOSE: 176 mg/dL — AB (ref 65–99)
POTASSIUM: 3.7 mmol/L (ref 3.5–5.1)
Sodium: 135 mmol/L (ref 135–145)
TOTAL PROTEIN: 6.5 g/dL (ref 6.5–8.1)

## 2016-04-10 MED ORDER — SODIUM CHLORIDE 0.9% FLUSH
10.0000 mL | INTRAVENOUS | Status: DC | PRN
  Administered 2016-04-10: 10 mL via INTRAVENOUS
  Filled 2016-04-10: qty 10

## 2016-04-10 MED ORDER — IOPAMIDOL (ISOVUE-300) INJECTION 61%
100.0000 mL | Freq: Once | INTRAVENOUS | Status: AC | PRN
  Administered 2016-04-10: 100 mL via INTRAVENOUS

## 2016-04-10 MED ORDER — HEPARIN SOD (PORK) LOCK FLUSH 100 UNIT/ML IV SOLN
500.0000 [IU] | Freq: Once | INTRAVENOUS | Status: AC
  Administered 2016-04-10: 500 [IU] via INTRAVENOUS

## 2016-04-11 LAB — CANCER ANTIGEN 27.29: CA 27.29: 27.6 U/mL (ref 0.0–38.6)

## 2016-04-11 NOTE — Progress Notes (Signed)
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 Now with biopsy-proven stage IV disease.  INTERVAL HISTORY: Patient returns to clinic today for further evaluation and discussion of her imaging results. She continues to have neuropathy in her fingertips and feet. She otherwise feels well and is asymptomatic. She has no other neurologic complaints. She denies any recent fevers or illnesses.  She denies any chest pain or shortness of breath.  She denies any nausea, vomiting, constipation, or diarrhea.  She has no urinary complaints.  Patient offers no specific complaints today.  REVIEW OF SYSTEMS:   Review of Systems  Constitutional: Negative.  Negative for fever, malaise/fatigue and weight loss.  Eyes: Negative.  Negative for blurred vision.  Respiratory: Negative.  Negative for cough and shortness of breath.   Cardiovascular: Negative.  Negative for chest pain and leg swelling.  Gastrointestinal: Negative.  Negative for abdominal pain.  Genitourinary: Negative.   Musculoskeletal: Positive for back pain.  Neurological: Positive for sensory change. Negative for weakness.  Endo/Heme/Allergies: Negative.   Psychiatric/Behavioral: Negative.  Negative for memory loss. The patient is not nervous/anxious.     As per HPI. Otherwise, a complete review of systems is negative.  PAST MEDICAL HISTORY: Past Medical History:  Diagnosis Date  . Back pain    occasionally  . Breast cancer ([place])    left  . Depression    takes Paxil and Wellbutrin daily  . Diabetes ([place])    takes Metformin daily  . GERD (gastroesophageal reflux disease)   . History of  bronchitis 2 yrs ago  . Hyperlipidemia    takes Atorvastatin daily  . Hypertension    takes Losartan-HCTZ daily  . Insomnia    takes Ambien nightly  . Insomnia    takes gabapentin nightly  . Joint pain   . Migraine   . Mood swings ([place])   . Osteoarthritis of knee   . PONV (postoperative nausea and vomiting)   . Seasonal allergies    takes Allegra daily    PAST SURGICAL HISTORY: Past Surgical History:  Procedure Laterality Date  . BREAST BIOPSY  2009  . cataract surgery Bilateral   . COLONOSCOPY    . JOINT REPLACEMENT Left 2014   knee  . KNEE ARTHROSCOPY Left    x 5  . MASTECTOMY Left   . port a cath placed    . PORTACATH PLACEMENT N/A 09/17/2015   Procedure: INSERTION PORT-A-CATH;  Surgeon: Jules Husbands, MD;  Location: [facility];  Service: General;  Laterality: N/A;  . TOTAL SHOULDER ARTHROPLASTY Right 06/03/2015   Procedure: TOTAL SHOULDER ARTHROPLASTY;  Surgeon: Tania Ade, MD;  Location: [facility];  Service: Orthopedics;  Laterality: Right;  Right total shoulder arthroplasty  . TOTAL SHOULDER REPLACEMENT Right 06/03/2015  . TUBAL LIGATION      FAMILY HISTORY: Father with non-Hodgkin's lymphoma, 2 paternal aunts with breast cancer.     ADVANCED DIRECTIVES:    HEALTH MAINTENANCE: Social History  Substance Use Topics  . Smoking status: Never Smoker  . Smokeless tobacco: Never Used  . Alcohol use 0.0 oz/week     Comment: occasionally wine     Colonoscopy:  PAP:  Bone density:  Lipid panel:  Allergies  Allergen Reactions  . Ace Inhibitors Cough  . Latex Itching  . Morphine And Related Itching    Caused her to itch terribly. Would prefer if given to take with a benadryl  . Other Itching    Freeze spray. Patient stated that she may be able to use it now because she doesn't use it as often.  Marland Kitchen Penicillins Rash    Has patient had a PCN reaction causing immediate rash, facial/tongue/throat swelling, SOB or lightheadedness with hypotension: No Has patient had  a PCN reaction causing severe rash involving mucus membranes or skin necrosis: No Has patient had a PCN reaction that required hospitalization No Has patient had a PCN reaction occurring within the last 10 years: No If all of the above answers are "NO", then may proceed with Cephalosporin use.    Current Outpatient Prescriptions  Medication Sig Dispense Refill  . aspirin EC 81 MG tablet Take 81 mg by mouth daily.     Marland Kitchen atorvastatin (LIPITOR) 10 MG tablet Take 10 mg by mouth daily at 6 PM.     . b complex vitamins capsule Take 1 capsule by mouth daily.    Marland Kitchen buPROPion (WELLBUTRIN XL) 150 MG 24 hr tablet Take 150 mg by mouth every morning.     . Calcium-Magnesium-Vitamin D (CALCIUM 1200+D3 PO) Take 1 Dose by mouth daily.    . fexofenadine (ALLEGRA) 180 MG tablet Take 180 mg by mouth at bedtime.     . gabapentin (NEURONTIN) 300 MG capsule Take 300 mg by mouth. 2 pills in am, and 3 pills at night    . letrozole (FEMARA) 2.5 MG tablet Take 1 tablet (2.5 mg total) by mouth daily. 90 tablet 3  . lidocaine-prilocaine (EMLA) cream Apply 1 application topically as needed. Apply to port 1-2 hours prior to chemotherapy appointment. Cover with plastic wrap. 30 g 0  . losartan-hydrochlorothiazide (HYZAAR) 100-25 MG per tablet Take 1 tablet by mouth every morning.     . metFORMIN (GLUCOPHAGE) 850 MG tablet Take 850 mg by mouth 2 (two) times daily with a meal.     . ondansetron (ZOFRAN ODT) 4 MG disintegrating tablet Take 1 tablet (4 mg total) by mouth every 8 (eight) hours as needed for nausea or vomiting. 20 tablet 0  . oxyCODONE-acetaminophen (ROXICET) 5-325 MG tablet Take 1 tablet by mouth every 6 (six) hours as needed. 20 tablet 0  . PARoxetine (PAXIL) 40 MG tablet Take 40 mg by mouth every morning.     . Zolpidem Tartrate (AMBIEN PO) Take by mouth.     Current Facility-Administered Medications  Medication Dose Route Frequency Provider Last Rate Last Dose  . guaiFENesin-dextromethorphan (ROBITUSSIN DM)  100-10 MG/5ML syrup 5 mL  5 mL Oral Once Lloyd Huger, MD        OBJECTIVE: Vitals:   04/12/16 0954  BP: 125/77  Pulse: 81  Resp: 18  Temp: (!) 96.8 F (36 C)     Body mass index is 36.29 kg/m.    ECOG FS:0 - Asymptomatic  General: Well-developed, well-nourished, no acute distress. Eyes: Pink conjunctiva, anicteric sclera. HEENT: No palpable lymphadenopathy. Breast: Left chest wall without evidence of recurrence.  Right breast and axilla without lumps or masses. Exam deferred today. Lungs: Clear to auscultation bilaterally. Heart: Regular rate and rhythm. No rubs, murmurs, or gallops. Abdomen: Soft, nontender, nondistended. No organomegaly noted, normoactive bowel sounds. Musculoskeletal: No edema, cyanosis, or clubbing. Neuro: Alert, answering all questions appropriately. Cranial nerves grossly intact. Skin: No rashes  or petechiae noted. Psych: Normal affect.   LAB RESULTS:  Lab Results  Component Value Date   NA 135 04/10/2016   K 3.7 04/10/2016   CL 100 (L) 04/10/2016   CO2 28 04/10/2016   GLUCOSE 176 (H) 04/10/2016   BUN 18 04/10/2016   CREATININE 0.95 04/10/2016   CALCIUM 9.1 04/10/2016   PROT 6.5 04/10/2016   ALBUMIN 3.7 04/10/2016   AST 39 04/10/2016   ALT 25 04/10/2016   ALKPHOS 52 04/10/2016   BILITOT 0.5 04/10/2016   GFRNONAA >60 04/10/2016   GFRAA >60 04/10/2016    Lab Results  Component Value Date   WBC 3.7 04/10/2016   NEUTROABS 1.5 04/10/2016   HGB 12.1 04/10/2016   HCT 36.6 04/10/2016   MCV 90.9 04/10/2016   PLT 235 04/10/2016     STUDIES: Ct Soft Tissue Neck W Contrast  Result Date: 04/10/2016 CLINICAL DATA:  Restaging breast cancer. EXAM: CT NECK WITH CONTRAST TECHNIQUE: Multidetector CT imaging of the neck was performed using the standard protocol following the bolus administration of intravenous contrast. CONTRAST:  152m ISOVUE-300 IOPAMIDOL (ISOVUE-300) INJECTION 61% COMPARISON:  01/04/2016 FINDINGS: Pharynx and larynx: Normal.  No mass or swelling. Salivary glands: Few calculi in the left parotid tail. Negative for mass or inflammation Thyroid: Negative Lymph nodes: Low left level 5 lymph node has diminished in size from prior. When measured at the current level on previous study maximal axial diameter is 15 mm compared to 17 mm. The mass also has less bulky appearance with more flat or concave margins. No newly enlarged lymph node. Vascular: Stable appearance of right IJ porta catheter. Limited intracranial: Negative Visualized orbits: Bilateral cataract resection.  Negative for mass. Mastoids and visualized paranasal sinuses: Negative Skeleton: Multifocal osseous metastatic disease, present at multiple levels in the thoracic and most convincingly at C4 and C7 (body and spinous process at C7). No convincing osseous progression by CT. Upper chest: Reported separately IMPRESSION: 1. Left level V nodal metastasis has slightly decreased in size since prior. No new adenopathy. 2. Known multifocal osseous metastatic disease. 3. Chest CT reported separately. Electronically Signed   By: JMonte FantasiaM.D.   On: 04/10/2016 13:53   Ct Chest W Contrast  Result Date: 04/10/2016 CLINICAL DATA:  66year old female with history of inflammatory breast carcinoma of the left breast status post left mastectomy followed by chemotherapy and radiation therapy. Followup examination. EXAM: CT CHEST, ABDOMEN, AND PELVIS WITH CONTRAST TECHNIQUE: Multidetector CT imaging of the chest, abdomen and pelvis was performed following the standard protocol during bolus administration of intravenous contrast. CONTRAST:  1028mISOVUE-300 IOPAMIDOL (ISOVUE-300) INJECTION 61% COMPARISON:  CT the abdomen and pelvis without contrast 02/17/2016. CT of the chest, abdomen and pelvis 12/27/2015. FINDINGS: CT CHEST FINDINGS Cardiovascular: Heart size is normal. There is no significant pericardial fluid, thickening or pericardial calcification. There is aortic atherosclerosis,  as well as atherosclerosis of the great vessels of the mediastinum and the coronary arteries, including calcified atherosclerotic plaque in the left anterior descending coronary arteries. Right internal jugular single-lumen porta cath with tip terminating at the superior cavoatrial junction. Mediastinum/Nodes: No pathologically enlarged mediastinal or hilar lymph nodes. Esophagus is unremarkable in appearance. No axillary lymphadenopathy. Lungs/Pleura: Subpleural reticulation and architectural distortion in the anterior aspect of the left upper lobe deep to the left breast, compatible with chronic postradiation scarring. No acute consolidative airspace disease. No pleural effusions. No suspicious appearing pulmonary nodules or masses. 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 This appears unchanged. No definite soft tissue mass noted in the mastectomy bed to suggest local recurrence of disease. Widespread predominantly sclerotic lesions are again noted throughout the visualized axial and appendicular skeleton, compatible with widespread metastatic disease to the bones. These lesions appear generally slightly more conspicuous and larger than the prior examination, however, this is likely a positive response to therapy. Status post right shoulder arthroplasty. CT ABDOMEN PELVIS FINDINGS Hepatobiliary: No cystic or solid hepatic lesions. No intra or extrahepatic biliary ductal dilatation. Gallbladder is normal in appearance. Pancreas: No pancreatic mass. No pancreatic ductal dilatation. No pancreatic or peripancreatic fluid or inflammatory changes. Spleen: Unremarkable. Adrenals/Urinary Tract: 3 mm nonobstructive calculus in the lower pole collecting system of the right kidney. Bilateral kidneys are otherwise normal in appearance. There  is no hydroureteronephrosis. Urinary bladder is normal in appearance. Bilateral adrenal glands are normal in appearance. Stomach/Bowel: Normal appearance of the stomach. No pathologic dilatation of small bowel or colon. The appendix is not confidently identified and may be surgically absent. Regardless, there are no inflammatory changes noted adjacent to the cecum to suggest the presence of an acute appendicitis at this time. Vascular/Lymphatic: Aortic atherosclerosis, without evidence of aneurysm or dissection in the abdominal or pelvic vasculature. No lymphadenopathy noted in the abdomen or pelvis. Reproductive: Uterus and ovaries are atrophic. Other: No significant volume of ascites.  No pneumoperitoneum. Musculoskeletal: Widespread predominantly sclerotic lesions are again noted throughout the visualized axial and appendicular skeleton overall very similar to the prior study, compatible with widespread metastatic disease to the bones. IMPRESSION: 1. Overall, today's study demonstrates essentially stable disease. Specifically, there multiple osseous lesions relatively similar to the prior examination (although some are slightly more conspicuous, which is favored to reflect a positive response to therapy with associated bony healing). No extra skeletal metastatic disease noted on today's examination. 2. 3 mm nonobstructive calculus in lower pole collecting system of the right kidney. 3. Aortic atherosclerosis, in addition to left anterior descending coronary artery disease. Please note that although the presence of coronary artery calcium documents the presence of coronary artery disease, the severity of this disease and any potential stenosis cannot be assessed on this non-gated CT examination. Assessment for potential risk factor modification, dietary therapy or pharmacologic therapy may be warranted, if clinically indicated. 4. Additional incidental findings, similar to prior studies, as above. Electronically  Signed   By: Vinnie Langton M.D.   On: 04/10/2016 13:47   Ct Abdomen Pelvis W Contrast  Result Date: 04/10/2016 CLINICAL DATA:  66 year old female with history of inflammatory breast carcinoma of the left breast status post left mastectomy followed by chemotherapy and radiation therapy. Followup examination. EXAM: CT CHEST, ABDOMEN, AND PELVIS WITH CONTRAST TECHNIQUE: Multidetector CT imaging of the chest, abdomen and pelvis was performed following the standard protocol during bolus administration of intravenous contrast. CONTRAST:  184m ISOVUE-300 IOPAMIDOL (ISOVUE-300) INJECTION 61% COMPARISON:  CT the abdomen and pelvis without contrast 02/17/2016. CT of the chest, abdomen and pelvis 12/27/2015. FINDINGS: CT CHEST FINDINGS Cardiovascular: Heart size is normal. There is no significant pericardial fluid, thickening or pericardial calcification. There is aortic atherosclerosis, as well as atherosclerosis of the great vessels of the mediastinum and the coronary arteries, including calcified atherosclerotic plaque in the left anterior descending coronary arteries. Right internal jugular single-lumen porta cath with tip terminating at the superior cavoatrial junction. Mediastinum/Nodes: No pathologically enlarged mediastinal or hilar lymph nodes. Esophagus  is unremarkable in appearance. No axillary lymphadenopathy. Lungs/Pleura: Subpleural reticulation and architectural distortion in the anterior aspect of the left upper lobe deep to the left breast, compatible with chronic postradiation scarring. No acute consolidative airspace disease. No pleural effusions. No suspicious appearing pulmonary nodules or masses. 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 This appears unchanged. No definite soft tissue mass noted in the mastectomy bed  to suggest local recurrence of disease. Widespread predominantly sclerotic lesions are again noted throughout the visualized axial and appendicular skeleton, compatible with widespread metastatic disease to the bones. These lesions appear generally slightly more conspicuous and larger than the prior examination, however, this is likely a positive response to therapy. Status post right shoulder arthroplasty. CT ABDOMEN PELVIS FINDINGS Hepatobiliary: No cystic or solid hepatic lesions. No intra or extrahepatic biliary ductal dilatation. Gallbladder is normal in appearance. Pancreas: No pancreatic mass. No pancreatic ductal dilatation. No pancreatic or peripancreatic fluid or inflammatory changes. Spleen: Unremarkable. Adrenals/Urinary Tract: 3 mm nonobstructive calculus in the lower pole collecting system of the right kidney. Bilateral kidneys are otherwise normal in appearance. There is no hydroureteronephrosis. Urinary bladder is normal in appearance. Bilateral adrenal glands are normal in appearance. Stomach/Bowel: Normal appearance of the stomach. No pathologic dilatation of small bowel or colon. The appendix is not confidently identified and may be surgically absent. Regardless, there are no inflammatory changes noted adjacent to the cecum to suggest the presence of an acute appendicitis at this time. Vascular/Lymphatic: Aortic atherosclerosis, without evidence of aneurysm or dissection in the abdominal or pelvic vasculature. No lymphadenopathy noted in the abdomen or pelvis. Reproductive: Uterus and ovaries are atrophic. Other: No significant volume of ascites.  No pneumoperitoneum. Musculoskeletal: Widespread predominantly sclerotic lesions are again noted throughout the visualized axial and appendicular skeleton overall very similar to the prior study, compatible with widespread metastatic disease to the bones. IMPRESSION: 1. Overall, today's study demonstrates essentially stable disease. Specifically, there  multiple osseous lesions relatively similar to the prior examination (although some are slightly more conspicuous, which is favored to reflect a positive response to therapy with associated bony healing). No extra skeletal metastatic disease noted on today's examination. 2. 3 mm nonobstructive calculus in lower pole collecting system of the right kidney. 3. Aortic atherosclerosis, in addition to left anterior descending coronary artery disease. Please note that although the presence of coronary artery calcium documents the presence of coronary artery disease, the severity of this disease and any potential stenosis cannot be assessed on this non-gated CT examination. Assessment for potential risk factor modification, dietary therapy or pharmacologic therapy may be warranted, if clinically indicated. 4. Additional incidental findings, similar to prior studies, as above. Electronically Signed   By: Vinnie Langton M.D.   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 Now with biopsy proven stage IV disease with lymph node and bone metastasis.  PLAN:    1.  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 Continue letrozole indefinitely or until progression of disease. Because of this, she will no longer be enrolled in the clinical trial. Patient will then return to clinic in 3 months with repeat imaging  and further evaluation, if everything remains stable she could possibly be switched to imaging every 6 months.  Given her bony disease, can consider Zometa in the future. Previously, she completed 5 years of Arimidex in June of 2015.  2.  Osteopenia: Patient's bone mineral density on January 26, 2016 reported a T score of -0.9 which is considered normal. Repeat in January 2020. Continue calcium and vitamin D.  3. Anemia: Resolved. 4. Peripheral neuropathy: Continue gabapentin as prescribed. Neuropathy managed by neurology.  5. Back pain: Resolved.  6. Cough: Patient does not complain of this today. Continue Tussionex as needed.  Patient expressed understanding and was in agreement with this plan. She also understands that She can call clinic at any time with any questions, concerns, or complaints.   Breast cancer   Staging form: Breast, AJCC 7th Edition     Pathologic stage from 08/11/2014: Stage IIIA (T0, N2a, cM0) - Signed by Lloyd Huger, MD on 08/11/2014   Lloyd Huger, MD   04/12/2016 11:36 AM

## 2016-04-12 ENCOUNTER — Encounter: Payer: Self-pay | Admitting: Oncology

## 2016-04-12 ENCOUNTER — Inpatient Hospital Stay (HOSPITAL_BASED_OUTPATIENT_CLINIC_OR_DEPARTMENT_OTHER): Payer: 59 | Admitting: Oncology

## 2016-04-12 VITALS — BP 125/77 | HR 81 | Temp 96.8°F | Resp 18 | Ht 64.0 in | Wt 211.4 lb

## 2016-04-12 DIAGNOSIS — C50912 Malignant neoplasm of unspecified site of left female breast: Secondary | ICD-10-CM | POA: Diagnosis not present

## 2016-04-12 DIAGNOSIS — Z170 Estrogen receptor positive status [ER+]: Secondary | ICD-10-CM

## 2016-04-12 DIAGNOSIS — I700 Atherosclerosis of aorta: Secondary | ICD-10-CM

## 2016-04-12 DIAGNOSIS — I10 Essential (primary) hypertension: Secondary | ICD-10-CM | POA: Diagnosis not present

## 2016-04-12 DIAGNOSIS — M2550 Pain in unspecified joint: Secondary | ICD-10-CM

## 2016-04-12 DIAGNOSIS — C7951 Secondary malignant neoplasm of bone: Secondary | ICD-10-CM | POA: Diagnosis not present

## 2016-04-12 DIAGNOSIS — K219 Gastro-esophageal reflux disease without esophagitis: Secondary | ICD-10-CM | POA: Diagnosis not present

## 2016-04-12 DIAGNOSIS — E785 Hyperlipidemia, unspecified: Secondary | ICD-10-CM

## 2016-04-12 DIAGNOSIS — Z79899 Other long term (current) drug therapy: Secondary | ICD-10-CM

## 2016-04-12 DIAGNOSIS — G629 Polyneuropathy, unspecified: Secondary | ICD-10-CM

## 2016-04-12 DIAGNOSIS — Z7984 Long term (current) use of oral hypoglycemic drugs: Secondary | ICD-10-CM

## 2016-04-12 DIAGNOSIS — F329 Major depressive disorder, single episode, unspecified: Secondary | ICD-10-CM | POA: Diagnosis not present

## 2016-04-12 DIAGNOSIS — M549 Dorsalgia, unspecified: Secondary | ICD-10-CM

## 2016-04-12 DIAGNOSIS — Z8669 Personal history of other diseases of the nervous system and sense organs: Secondary | ICD-10-CM

## 2016-04-12 DIAGNOSIS — G4700 Insomnia, unspecified: Secondary | ICD-10-CM | POA: Diagnosis not present

## 2016-04-12 DIAGNOSIS — Z803 Family history of malignant neoplasm of breast: Secondary | ICD-10-CM

## 2016-04-12 DIAGNOSIS — M1990 Unspecified osteoarthritis, unspecified site: Secondary | ICD-10-CM

## 2016-04-12 DIAGNOSIS — Z807 Family history of other malignant neoplasms of lymphoid, hematopoietic and related tissues: Secondary | ICD-10-CM

## 2016-04-12 DIAGNOSIS — Z7982 Long term (current) use of aspirin: Secondary | ICD-10-CM

## 2016-04-12 DIAGNOSIS — Z9012 Acquired absence of left breast and nipple: Secondary | ICD-10-CM

## 2016-04-12 NOTE — Progress Notes (Signed)
Pt doing good, just neuopathy of feet is issues

## 2016-04-13 ENCOUNTER — Ambulatory Visit: Payer: 59 | Attending: Plastic Surgery | Admitting: Physical Therapy

## 2016-04-13 DIAGNOSIS — M25612 Stiffness of left shoulder, not elsewhere classified: Secondary | ICD-10-CM | POA: Diagnosis present

## 2016-04-13 DIAGNOSIS — I972 Postmastectomy lymphedema syndrome: Secondary | ICD-10-CM | POA: Diagnosis present

## 2016-04-13 DIAGNOSIS — L599 Disorder of the skin and subcutaneous tissue related to radiation, unspecified: Secondary | ICD-10-CM | POA: Diagnosis present

## 2016-04-13 DIAGNOSIS — M25512 Pain in left shoulder: Secondary | ICD-10-CM

## 2016-04-13 DIAGNOSIS — R29898 Other symptoms and signs involving the musculoskeletal system: Secondary | ICD-10-CM

## 2016-04-13 NOTE — Therapy (Signed)
[place], [place], [address] Phone: [phone]   Fax:  [phone]  Physical Therapy Evaluation  Patient Details  Name: Amanda [name] MRN: [id number] Date of Birth: 1950-05-13 Referring Provider: Dr. Irene Limbo (saw her about pain left chest)  Encounter Date: 04/13/2016      PT End of Session - 04/13/16 1622    Visit Number 1   Number of Visits 9   Date for PT Re-Evaluation 05/22/16   PT Start Time 1520   PT Stop Time 1618   PT Time Calculation (min) 58 min   Activity Tolerance Patient tolerated treatment well   Behavior During Therapy [facility] for tasks assessed/performed      Past Medical History:  Diagnosis Date  . Back pain    occasionally  . Breast cancer ([place])    left  . Depression    takes Paxil and Wellbutrin daily  . Diabetes ([place])    takes Metformin daily  . GERD (gastroesophageal reflux disease)   . History of bronchitis 2 yrs ago  . Hyperlipidemia    takes Atorvastatin daily  . Hypertension    takes Losartan-HCTZ daily  . Insomnia    takes Ambien nightly  . Insomnia    takes gabapentin nightly  . Joint pain   . Migraine   . Mood swings ([place])   . Osteoarthritis of knee   . PONV (postoperative nausea and vomiting)   . Seasonal allergies    takes Allegra daily    Past Surgical History:  Procedure Laterality Date  . BREAST BIOPSY  2009  . cataract surgery Bilateral   . COLONOSCOPY    . JOINT REPLACEMENT Left 2014   knee  . KNEE ARTHROSCOPY Left    x 5  . MASTECTOMY Left   . port a cath placed    . PORTACATH PLACEMENT N/A 09/17/2015   Procedure: INSERTION PORT-A-CATH;  Surgeon: Jules Husbands, MD;  Location: [facility];  Service: General;  Laterality: N/A;  . TOTAL SHOULDER ARTHROPLASTY Right 06/03/2015   Procedure: TOTAL SHOULDER ARTHROPLASTY;  Surgeon: Tania Ade, MD;  Location: [place];  Service: Orthopedics;  Laterality: Right;  Right total shoulder  arthroplasty  . TOTAL SHOULDER REPLACEMENT Right 06/03/2015  . TUBAL LIGATION      There were no vitals filed for this visit.       Subjective Assessment - 04/13/16 1523    Subjective "I've been doing pretty good but for maybe a month, it depends on how I sleep, my left shoulder and arm have been really bad.  I don't know what's going on." She gets a massage once a month for left upper trap relaxation but also lymphedema.   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  Then adjuvant radiation.  Was doing fine and when I saw my oncologist for my checkup, the doctor sent her for PT for lymphedema.  Did okay but didn't keep up with self-care for that. July 2017 noted an enlarged left neck lymph node. Had multiple scans and US guided biopsy and found metastatic disease. Metastasis is in L4.  Also had some in chest wall.  She denies other bony metastases, including the left shoulder. Was in a research study and had palliative radiation to her low back.  Had 3 month CAT scan on Monday, and the areas of concern have shrunk.  Has foot neuropathy.  Diabetes;  HTN; h/o right total shoulder replacement, left knee replacement.   Patient Stated Goals To see what's going on and to find out what I need to do. I don't want to wear the compression sleeve the rest of my life.   Currently in Pain? Yes   Pain Score 8    Pain Location Other (Comment)  upper trap   Pain Orientation Left;Upper   Pain Descriptors / Indicators Sharp   Pain Type Acute pain   Aggravating Factors  worse with raising the left arm up and some sleep positions   Pain Relieving Factors doesn't know anything            [facility] PT Assessment - 04/13/16 0001      Assessment   Medical Diagnosis left breast cancer   Referring Provider Dr. Irene Limbo  saw her about pain left chest   Onset Date/Surgical Date 03/02/08   approx.   Hand Dominance Right   Prior Therapy none     Precautions   Precautions Other (comment)   Precaution Comments cancer precautions; bony metastases in low back  h/o right total shoulder replacement     Restrictions   Weight Bearing Restrictions No     Balance Screen   Has the patient fallen in the past 6 months Yes   How many times? 1  bruised anterior left lower leg   Has the patient had a decrease in activity level because of a fear of falling?  No  but she is more careful   Is the patient reluctant to leave their home because of a fear of falling?  No  neuropathy is worse at night, distal feet, hurts to walk     [place] residence   Living Arrangements Spouse/significant other   Type of [place]     Prior Function   Level of [place] Retired   Leisure none  has an elliptical at home and used to do that     [employer]   Overall Cognitive Status Within Functional Limits for tasks assessed     Observation/Other Assessments   Observations left chest area with telangectasias inferior to scar; above scar, there is a pocket of soft tissue and/or swelling   Other Surveys  --  LLIS score of 10 = 15% impaired     ROM / Strength   AROM / PROM / Strength AROM     AROM   Overall AROM Comments cervical AROM limited in right > left sidebend; reports pain with right sidebend and right rotation   AROM Assessment Site Shoulder   Right/Left Shoulder Right;Left   Right Shoulder Flexion 140 Degrees   Right Shoulder ABduction 159 Degrees   Right Shoulder Internal Rotation 47 Degrees   Right Shoulder External Rotation 70 Degrees   Left Shoulder Flexion 138 Degrees   Left Shoulder ABduction 144 Degrees   Left Shoulder Internal Rotation 60 Degrees   Left Shoulder External Rotation 78 Degrees     Palpation   Palpation comment left chest incision is adhered and with a pocket of soft tissue above it; very tight  upper trap muscles bilat.     Special Tests    Special Tests Rotator Cuff Impingement   Rotator Cuff Impingment tests Michel Bickers test  no tenderness with palpation at left subacromial bursa     Hawkins-Kennedy test   Findings Negative  but c/o left upper trap pain   Side Left  Ambulation/Gait   Ambulation/Gait Yes   Ambulation/Gait Assistance 7: Independent           LYMPHEDEMA/ONCOLOGY QUESTIONNAIRE - 04/13/16 1549      Type   Cancer Type left breast     Surgeries   Lumpectomy Date 03/02/08  approx.   Number Lymph Nodes Removed 11  4 positive     Treatment   Past Chemotherapy Treatment Yes   Date 01/03/16  for recent regimen   Past Radiation Treatment Yes   Current Hormone Treatment Yes   Drug Name letrozole     What other symptoms do you have   Are you having pitting edema No     Lymphedema Assessments   Lymphedema Assessments Upper extremities     Right Upper Extremity Lymphedema   15 cm Proximal to Olecranon Process 37.5 cm   10 cm Proximal to Olecranon Process 36.4 cm   Olecranon Process 27.9 cm   10 cm Proximal to Ulnar Styloid Process 25.6 cm   Just Proximal to Ulnar Styloid Process 18 cm   Across Hand at [employer] 21.3 cm   At [place] of 2nd Digit 7.2 cm     Left Upper Extremity Lymphedema   15 cm Proximal to Olecranon Process 39.3 cm   10 cm Proximal to Olecranon Process 38.2 cm   Olecranon Process 27.6 cm   10 cm Proximal to Ulnar Styloid Process 25.8 cm   Just Proximal to Ulnar Styloid Process 17.6 cm   Across Hand at [employer] 20.3 cm   At [place] of 2nd Digit 6.9 cm                                Long Term Clinic Goals - 04/13/16 1631      CC Long Term Goal  #1   Title Pt. will report at least 60% decrease in left upper trap/shoulder area pain.   Time 4   Period Weeks   Status New     CC Long Term Goal  #2   Title Patient will be knowledgeable about self-care techniques and tools available  for managing her mild left UE lymphedema.   Time 4   Period Weeks   Status New     CC Long Term Goal  #3   Title left shoulder active flexion at least 150 degrees   Time 4   Period Weeks   Status New     CC Long Term Goal  #4   Title left shoulder active abduction at least 155 degrees   Time 4   Period Weeks   Status New            Plan - 04/13/16 1623    Clinical Impression Statement This is a pleasant, rather chatty woman with metastatic inflammatory left breast cancer, s/p mastectomy with approx. 11 nodes removed. Initial cancer diagnosis was in 2009 and metastatic disease found years later with chemo in December 2017.  She has been treated for left UE lymphedema before, and on the one hand says she wants to do what she should to treat it, but on the other doesn't want to wear compression.  She has mild left upper arm swelling; no swelling measured lower in arm. She has left shoulder area pain with very tight upper traps; this has been helped with massage that she gets once a month.  She has a rather adhered left mastectomy scar.  Eval is moderate complexity with multiple comorbidities including diabetes and HTN, and evolving with metastatic disease. h/o right total shoulder replacement and left total knee replacement.   Rehab Potential Good   PT Frequency 2x / week   PT Duration 4 weeks   PT Treatment/Interventions ADLs/Self Care Home Management;Electrical Stimulation;DME Instruction;Therapeutic exercise;Patient/family education;Manual techniques;Manual lymph drainage;Scar mobilization;Passive range of motion;Taping;Compression bandaging   PT Next Visit Plan Myofascial work, soft tissue and scar mobilization for left chest; soft tissue work and stretching for bilat. upper traps; manual lymph drainage for left UE; discuss self-care options for her mild left upper arm lymphedema and assist with setting up whatever she might follow through on.   Consulted and Agree with Plan of Care  Patient      Patient will benefit from skilled therapeutic intervention in order to improve the following deficits and impairments:  Pain, Increased fascial restricitons, Decreased range of motion, Increased edema  Visit Diagnosis: Left shoulder pain, unspecified chronicity - Plan: PT plan of care cert/re-cert  Stiffness of left shoulder, not elsewhere classified - Plan: PT plan of care cert/re-cert  Disorder of the skin and subcutaneous tissue related to radiation, unspecified - Plan: PT plan of care cert/re-cert  Other symptoms and signs involving the musculoskeletal system - Plan: PT plan of care cert/re-cert  Postmastectomy lymphedema - Plan: PT plan of care cert/re-cert      G-Codes - 78/58/85 1635    Functional Assessment Tool Used (Outpatient Only) lymphedema life impact scale   Functional Limitation Self care   Self Care Current Status (O2774) At least 1 percent but less than 20 percent impaired, limited or restricted  score of 10 = 15% impairment   Self Care Goal Status (J2878) At least 1 percent but less than 20 percent impaired, limited or restricted       Problem List Patient Active Problem List   Diagnosis Date Noted  . Metastatic breast cancer ([place]) 09/24/2015  . 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 Personal history of breast cancer 08/23/2015  . 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 Degenerative arthritis of right shoulder region 01/13/2015  . Cancer of left female breast  ([place]) 07/27/2014  . 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 Rotator cuff tear 06/10/2014  . Allergic rhinitis, seasonal 09/16/2013  . Depression 08/10/2013  . Diabetes mellitus type 2, uncomplicated ([place]) 67/67/2094  . Hyperlipidemia, unspecified 08/10/2013  . BP (high blood pressure) 08/10/2013  . Osteoporosis, post-menopausal 08/10/2013    Nichola Warren 04/13/2016, 4:39 PM  [place], [place], [address] Phone: [phone]   Fax:  [phone]  Name: Amanda [name] MRN: [id number] Date of Birth: 06/28/1950  Serafina Royals, PT 04/13/16 4:40 PM

## 2016-04-18 ENCOUNTER — Ambulatory Visit: Payer: 59 | Attending: Plastic Surgery

## 2016-04-18 DIAGNOSIS — I972 Postmastectomy lymphedema syndrome: Secondary | ICD-10-CM | POA: Insufficient documentation

## 2016-04-18 DIAGNOSIS — L599 Disorder of the skin and subcutaneous tissue related to radiation, unspecified: Secondary | ICD-10-CM | POA: Diagnosis present

## 2016-04-18 DIAGNOSIS — M25612 Stiffness of left shoulder, not elsewhere classified: Secondary | ICD-10-CM | POA: Insufficient documentation

## 2016-04-18 DIAGNOSIS — M25512 Pain in left shoulder: Secondary | ICD-10-CM | POA: Diagnosis present

## 2016-04-18 DIAGNOSIS — R29898 Other symptoms and signs involving the musculoskeletal system: Secondary | ICD-10-CM | POA: Diagnosis present

## 2016-04-18 NOTE — Therapy (Signed)
[place], [place], [address] Phone: [phone]   Fax:  [phone]  Physical Therapy Treatment  Patient Details  Name: Amanda [name] MRN: [id number] Date of Birth: October 25, 1950 Referring Provider: Dr. Irene Limbo (saw her about pain left chest)  Encounter Date: 04/18/2016      PT End of Session - 04/18/16 1103    Visit Number 2   Number of Visits 9   Date for PT Re-Evaluation 05/22/16   PT Start Time 1018   PT Stop Time 1103   PT Time Calculation (min) 45 min   Activity Tolerance Patient tolerated treatment well   Behavior During Therapy [facility] for tasks assessed/performed      Past Medical History:  Diagnosis Date  . Back pain    occasionally  . Breast cancer ([place])    left  . Depression    takes Paxil and Wellbutrin daily  . Diabetes ([place])    takes Metformin daily  . GERD (gastroesophageal reflux disease)   . History of bronchitis 2 yrs ago  . Hyperlipidemia    takes Atorvastatin daily  . Hypertension    takes Losartan-HCTZ daily  . Insomnia    takes Ambien nightly  . Insomnia    takes gabapentin nightly  . Joint pain   . Migraine   . Mood swings ([place])   . Osteoarthritis of knee   . PONV (postoperative nausea and vomiting)   . Seasonal allergies    takes Allegra daily    Past Surgical History:  Procedure Laterality Date  . BREAST BIOPSY  2009  . cataract surgery Bilateral   . COLONOSCOPY    . JOINT REPLACEMENT Left 2014   knee  . KNEE ARTHROSCOPY Left    x 5  . MASTECTOMY Left   . port a cath placed    . PORTACATH PLACEMENT N/A 09/17/2015   Procedure: INSERTION PORT-A-CATH;  Surgeon: Jules Husbands, MD;  Location: [facility];  Service: General;  Laterality: N/A;  . TOTAL SHOULDER ARTHROPLASTY Right 06/03/2015   Procedure: TOTAL SHOULDER ARTHROPLASTY;  Surgeon: Tania Ade, MD;  Location: [place];  Service: Orthopedics;  Laterality: Right;  Right total shoulder  arthroplasty  . TOTAL SHOULDER REPLACEMENT Right 06/03/2015  . TUBAL LIGATION      There were no vitals filed for this visit.      Subjective Assessment - 04/18/16 1018    Subjective I'm not hurting today. Nothing has changed since I was here last week.    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  Then adjuvant radiation.  Was doing fine and when I saw my oncologist for my checkup, the doctor sent her for PT for lymphedema.  Did okay but didn't keep up with self-care for that. July 2017 noted an enlarged left neck lymph node. Had multiple scans and US guided biopsy and found metastatic disease. Metastasis is in L4.  Also had some in chest wall.  She denies other bony metastases, including the left shoulder. Was in a research study and had palliative radiation to her low back.  Had 3 month CAT scan on Monday, and the areas of concern have shrunk.  Has foot neuropathy.  Diabetes; HTN; h/o right total shoulder replacement, left knee replacement.   Patient Stated Goals To see what's going on and to find out what I need to do. I don't want to wear the  compression sleeve the rest of my life.   Currently in Pain? No/denies                         [facility] Adult PT Treatment/Exercise - 04/18/16 0001      Manual Therapy   Manual Therapy Manual Lymphatic Drainage (MLD);Myofascial release;Passive ROM   Myofascial Release To Lt chest wall during P/ROM to pts tolerance; also cross hands vertically and horizontally   Manual Lymphatic Drainage (MLD) In Supine: Short neck, superficial and deep abdominals, Lt inguinal and Rt axillary nodes, Lt axillo-inguinal and anterior inter-axillary anastomosis, then Lt UE from dorsal hand to lateral shoulder working proximal to distal then retracing all steps and beginning to instruct pt while performing.   Passive ROM To Lt shoulder in supine  into flexion, abduction, and er to pts tolerance.                        Long Term Clinic Goals - 04/13/16 1631      CC Long Term Goal  #1   Title Pt. will report at least 60% decrease in left upper trap/shoulder area pain.   Time 4   Period Weeks   Status New     CC Long Term Goal  #2   Title Patient will be knowledgeable about self-care techniques and tools available for managing her mild left UE lymphedema.   Time 4   Period Weeks   Status New     CC Long Term Goal  #3   Title left shoulder active flexion at least 150 degrees   Time 4   Period Weeks   Status New     CC Long Term Goal  #4   Title left shoulder active abduction at least 155 degrees   Time 4   Period Weeks   Status New            Plan - 04/18/16 1104    Clinical Impression Statement Pt did well with first session of stretching and manual lymph drainage today. Also able to verbalize a good understanding of basics of anatomy of lymphatic system after educated today. Pt reports feeling good after session today, Lt shoulder felt looser. Discussed with pt different compression options and she is willing to wear a velcro compression garment at night and potentially during the day prn if she has flare up of swelling.    Rehab Potential Good   PT Frequency 2x / week   PT Duration 4 weeks   PT Treatment/Interventions ADLs/Self Care Home Management;Electrical Stimulation;DME Instruction;Therapeutic exercise;Patient/family education;Manual techniques;Manual lymph drainage;Scar mobilization;Passive range of motion;Taping;Compression bandaging   PT Next Visit Plan Cont myofascial work, soft tissue and scar mobilization for left chest; soft tissue work and stretching for bilat. upper traps; instruct and issue handout for manual lymph drainage for left UE; discuss self-care options for her mild left upper arm lymphedema and assist with getting pt a velcro compression garment.    Consulted and Agree with  Plan of Care Patient      Patient will benefit from skilled therapeutic intervention in order to improve the following deficits and impairments:  Pain, Increased fascial restricitons, Decreased range of motion, Increased edema  Visit Diagnosis: Left shoulder pain, unspecified chronicity  Stiffness of left shoulder, not elsewhere classified  Disorder of the skin and subcutaneous tissue related to radiation, unspecified  Other symptoms and signs involving the musculoskeletal system  Postmastectomy lymphedema  Problem List Patient Active Problem List   Diagnosis Date Noted  . Metastatic breast cancer ([place]) 09/24/2015  . 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 Personal history of breast cancer 08/23/2015  . 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 Degenerative arthritis of right shoulder region 01/13/2015  . Cancer of left female breast  ([place]) 07/27/2014  . 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 Rotator cuff tear 06/10/2014  . Allergic rhinitis, seasonal 09/16/2013  . Depression 08/10/2013  . Diabetes mellitus type 2, uncomplicated ([place]) 00/37/0488  . Hyperlipidemia, unspecified 08/10/2013  . BP (high blood pressure) 08/10/2013  . Osteoporosis, post-menopausal 08/10/2013    Otelia Limes, PTA 04/18/2016, 11:08 AM  [place], [place], [address] Phone: [phone]   Fax:  [phone]  Name: DERIN MATTHES MRN: [id number] Date of Birth: 22-Jul-1950

## 2016-04-20 ENCOUNTER — Ambulatory Visit: Payer: 59

## 2016-04-20 DIAGNOSIS — M25512 Pain in left shoulder: Secondary | ICD-10-CM

## 2016-04-20 DIAGNOSIS — I972 Postmastectomy lymphedema syndrome: Secondary | ICD-10-CM

## 2016-04-20 DIAGNOSIS — L599 Disorder of the skin and subcutaneous tissue related to radiation, unspecified: Secondary | ICD-10-CM

## 2016-04-20 DIAGNOSIS — M25612 Stiffness of left shoulder, not elsewhere classified: Secondary | ICD-10-CM

## 2016-04-20 DIAGNOSIS — R29898 Other symptoms and signs involving the musculoskeletal system: Secondary | ICD-10-CM

## 2016-04-20 NOTE — Therapy (Signed)
[place], [place], [address] Phone: [phone]   Fax:  [phone]  Physical Therapy Treatment  Patient Details  Name: Amanda [name] MRN: [id number] Date of Birth: May 19, 1950 Referring Provider: Dr. Irene Limbo (saw her about pain left chest)  Encounter Date: 04/20/2016      PT End of Session - 04/20/16 1106    Visit Number 3   Number of Visits 9   Date for PT Re-Evaluation 05/22/16   PT Start Time 1017   PT Stop Time 1106   PT Time Calculation (min) 49 min   Activity Tolerance Patient tolerated treatment well   Behavior During Therapy [facility] for tasks assessed/performed      Past Medical History:  Diagnosis Date  . Back pain    occasionally  . Breast cancer ([place])    left  . Depression    takes Paxil and Wellbutrin daily  . Diabetes ([place])    takes Metformin daily  . GERD (gastroesophageal reflux disease)   . History of bronchitis 2 yrs ago  . Hyperlipidemia    takes Atorvastatin daily  . Hypertension    takes Losartan-HCTZ daily  . Insomnia    takes Ambien nightly  . Insomnia    takes gabapentin nightly  . Joint pain   . Migraine   . Mood swings ([place])   . Osteoarthritis of knee   . PONV (postoperative nausea and vomiting)   . Seasonal allergies    takes Allegra daily    Past Surgical History:  Procedure Laterality Date  . BREAST BIOPSY  2009  . cataract surgery Bilateral   . COLONOSCOPY    . JOINT REPLACEMENT Left 2014   knee  . KNEE ARTHROSCOPY Left    x 5  . MASTECTOMY Left   . port a cath placed    . PORTACATH PLACEMENT N/A 09/17/2015   Procedure: INSERTION PORT-A-CATH;  Surgeon: Jules Husbands, MD;  Location: [facility];  Service: General;  Laterality: N/A;  . TOTAL SHOULDER ARTHROPLASTY Right 06/03/2015   Procedure: TOTAL SHOULDER ARTHROPLASTY;  Surgeon: Tania Ade, MD;  Location: [facility];  Service: Orthopedics;  Laterality: Right;  Right total shoulder  arthroplasty  . TOTAL SHOULDER REPLACEMENT Right 06/03/2015  . TUBAL LIGATION      There were no vitals filed for this visit.      Subjective Assessment - 04/20/16 1021    Subjective I could tell we stretched last time but I wasn't hurting or anything. It was a good session.   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  Then adjuvant radiation.  Was doing fine and when I saw my oncologist for my checkup, the doctor sent her for PT for lymphedema.  Did okay but didn't keep up with self-care for that. July 2017 noted an enlarged left neck lymph node. Had multiple scans and US guided biopsy and found metastatic disease. Metastasis is in L4.  Also had some in chest wall.  She denies other bony metastases, including the left shoulder. Was in a research study and had palliative radiation to her low back.  Had 3 month CAT scan on Monday, and the areas of concern have shrunk.  Has foot neuropathy.  Diabetes; HTN; h/o right total shoulder replacement, left knee replacement.   Patient Stated Goals To see what's going on and to find out what I need to do. I don't  want to wear the compression sleeve the rest of my life.   Currently in Pain? No/denies                         [facility] : [facility] Adult PT Treatment/Exercise - 04/20/16 0001      Manual Therapy   Manual Therapy Manual Lymphatic Drainage (MLD);Myofascial release;Passive ROM   Myofascial Release To Lt chest wall during P/ROM to pts tolerance; also cross hands vertically and horizontally   Manual Lymphatic Drainage (MLD) In Supine: Short neck, superficial and deep abdominals, Lt inguinal and Rt axillary nodes, Lt axillo-inguinal and anterior inter-axillary anastomosis, then Lt UE from dorsal hand to lateral shoulder working proximal to distal then retracing all steps and beginning to instruct pt while performing.   Passive ROM To Lt  shoulder in supine into flexion, abduction, and er to pts tolerance.                PT Education - 04/20/16 1027    Education provided (P)  Yes   Education Details (P)  Self manual lymph drainage   Person(s) Educated (P)  Patient   Methods (P)  Explanation;Demonstration;Handout   Comprehension (P)  Verbalized understanding;Returned demonstration;Need further instruction                [facility] Goals - 04/13/16 1631      CC Long Term Goal  #1   Title Pt. will report at least 60% decrease in left upper trap/shoulder area pain.   Time 4   Period Weeks   Status New     CC Long Term Goal  #2   Title Patient will be knowledgeable about self-care techniques and tools available for managing her mild left UE lymphedema.   Time 4   Period Weeks   Status New     CC Long Term Goal  #3   Title left shoulder active flexion at least 150 degrees   Time 4   Period Weeks   Status New     CC Long Term Goal  #4   Title left shoulder active abduction at least 155 degrees   Time 4   Period Weeks   Status New            Plan - 04/20/16 1107    Clinical Impression Statement Continued with instruction of self manual lymph drainage and pt did very well with this and was able to return proper technique with minor cuing/remonders for full skin stretch. She did well with correct pressure. She would like to pursue a velcro compression garment at [facility] so will fax order to Dr. Ellison Hughs today to get that process started.    Rehab Potential Good   PT Frequency 2x / week   PT Duration 4 weeks   PT Treatment/Interventions ADLs/Self Care Home Management;Electrical Stimulation;DME Instruction;Therapeutic exercise;Patient/family education;Manual techniques;Manual lymph drainage;Scar mobilization;Passive range of motion;Taping;Compression bandaging   PT Next Visit Plan Cont myofascial work, soft tissue and scar mobilization for left chest; soft tissue work and  stretching for bilat. upper traps; instruct and review and cont manual lymph drainage for left UE; send pt to [facility] once script returned from doctor.    Consulted and Agree with Plan of Care Patient      Patient will benefit from skilled therapeutic intervention in order to improve the following deficits and impairments:  Pain, Increased fascial restricitons, Decreased range of motion, Increased edema  Visit Diagnosis: Stiffness of  left shoulder, not elsewhere classified  Disorder of the skin and subcutaneous tissue related to radiation, unspecified  Other symptoms and signs involving the musculoskeletal system  Postmastectomy lymphedema  Left shoulder pain, unspecified chronicity     Problem List Patient Active Problem List   Diagnosis Date Noted  . Metastatic breast cancer ([place]) 09/24/2015  . 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 Personal history of breast cancer 08/23/2015  . 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 Degenerative arthritis of right shoulder region 01/13/2015  . Cancer of left female breast  ([place]) 07/27/2014  . 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 Rotator cuff tear 06/10/2014  . Allergic rhinitis, seasonal 09/16/2013  . Depression 08/10/2013  . Diabetes mellitus type 2, uncomplicated ([place]) 15/94/5859  . Hyperlipidemia, unspecified 08/10/2013  . BP (high blood pressure) 08/10/2013  . Osteoporosis, post-menopausal 08/10/2013    Otelia Limes, PTA 04/20/2016, 11:12 AM  [place], [place], [address] Phone: [phone]   Fax:  [phone]  Name: Amanda [name] MRN: [id number] Date of Birth: Oct 28, 1950

## 2016-04-20 NOTE — Patient Instructions (Signed)

## 2016-04-25 ENCOUNTER — Ambulatory Visit: Payer: 59 | Admitting: Physical Therapy

## 2016-04-25 DIAGNOSIS — L599 Disorder of the skin and subcutaneous tissue related to radiation, unspecified: Secondary | ICD-10-CM

## 2016-04-25 DIAGNOSIS — R29898 Other symptoms and signs involving the musculoskeletal system: Secondary | ICD-10-CM

## 2016-04-25 DIAGNOSIS — M25612 Stiffness of left shoulder, not elsewhere classified: Secondary | ICD-10-CM

## 2016-04-25 DIAGNOSIS — M25512 Pain in left shoulder: Secondary | ICD-10-CM

## 2016-04-25 DIAGNOSIS — I972 Postmastectomy lymphedema syndrome: Secondary | ICD-10-CM

## 2016-04-25 NOTE — Therapy (Signed)
[place], [place], [address] Phone: [phone]   Fax:  [phone]  Physical Therapy Treatment  Patient Details  Name: Amanda [name] MRN: [id number] Date of Birth: 03-18-1950 Referring Provider: Dr. Irene Limbo (saw her about pain left chest)  Encounter Date: 04/25/2016      PT End of Session - 04/25/16 1304    Visit Number 4   Number of Visits 9   Date for PT Re-Evaluation 05/22/16   PT Start Time 9024   PT Stop Time 1103   PT Time Calculation (min) 48 min   Activity Tolerance Patient tolerated treatment well   Behavior During Therapy [facility] for tasks assessed/performed      Past Medical History:  Diagnosis Date  . Back pain    occasionally  . Breast cancer ([place])    left  . Depression    takes Paxil and Wellbutrin daily  . Diabetes ([place])    takes Metformin daily  . GERD (gastroesophageal reflux disease)   . History of bronchitis 2 yrs ago  . Hyperlipidemia    takes Atorvastatin daily  . Hypertension    takes Losartan-HCTZ daily  . Insomnia    takes Ambien nightly  . Insomnia    takes gabapentin nightly  . Joint pain   . Migraine   . Mood swings ([place])   . Osteoarthritis of knee   . PONV (postoperative nausea and vomiting)   . Seasonal allergies    takes Allegra daily    Past Surgical History:  Procedure Laterality Date  . BREAST BIOPSY  2009  . cataract surgery Bilateral   . COLONOSCOPY    . JOINT REPLACEMENT Left 2014   knee  . KNEE ARTHROSCOPY Left    x 5  . MASTECTOMY Left   . port a cath placed    . PORTACATH PLACEMENT N/A 09/17/2015   Procedure: INSERTION PORT-A-CATH;  Surgeon: Jules Husbands, MD;  Location: [facility];  Service: General;  Laterality: N/A;  . TOTAL SHOULDER ARTHROPLASTY Right 06/03/2015   Procedure: TOTAL SHOULDER ARTHROPLASTY;  Surgeon: Tania Ade, MD;  Location: [place];  Service: Orthopedics;  Laterality: Right;  Right total shoulder  arthroplasty  . TOTAL SHOULDER REPLACEMENT Right 06/03/2015  . TUBAL LIGATION      There were no vitals filed for this visit.      Subjective Assessment - 04/25/16 1018    Subjective Pt states she saw Dr Iran Planas.  She says she sill has pain in her her shoulder. She has noticed that the fluid in the "roll" aber her incision has improved.  She has been trying to do her manual lymph drainage at home. 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  Then adjuvant radiation.  Was doing fine and when I saw my oncologist for my checkup, the doctor sent her for PT for lymphedema.  Did okay but didn't keep up with self-care for that. July 2017 noted an enlarged left neck lymph node. Had multiple scans and US guided biopsy and found metastatic disease. Metastasis is in L4.  Also had some in chest wall.  She denies other bony metastases, including the left shoulder. Was in a research study and had palliative radiation to her low back.  Had 3 month CAT  scan on Monday, and the areas of concern have shrunk.  Has foot neuropathy.  Diabetes; HTN; h/o right total shoulder replacement, left knee replacement.   Patient Stated Goals To see what's going on and to find out what I need to do. I don't want to wear the compression sleeve the rest of my life.   Currently in Pain? Yes   Pain Score 5    Pain Location Shoulder   Pain Orientation Left;Upper   Pain Type Acute pain   Aggravating Factors  worse when raising arm overhead    Pain Relieving Factors can't say                          [facility] Adult PT Treatment/Exercise - 04/25/16 0001      Self-Care   Self-Care Other Self-Care Comments   Other Self-Care Comments  issued information about [employer] for pt to get soft,  light weight prosthesis for cosmesis in clothes      Manual Therapy   Myofascial Release To Lt chest wall during P/ROM to pts tolerance; also cross hands vertically and horizontally   Manual Lymphatic Drainage (MLD) In Supine: Short neck, superficial and deep abdominals, Lt inguinal and Rt axillary nodes, Lt axillo-inguinal and anterior inter-axillary anastomosis, then Lt UE from dorsal hand to lateral shoulder working proximal to distal then retracing all steps.  Then to sidelying for back and left lateral chest    Passive ROM To Lt shoulder in supine into flexion, abduction, and er to pts tolerance.                PT Education - 04/25/16 1304    Education provided Yes   Education Details how to get [employer]    Person(s) Educated Patient   Methods Explanation;Handout   Comprehension Verbalized understanding                [facility] Goals - 04/13/16 1631      CC Long Term Goal  #1   Title Pt. will report at least 60% decrease in left upper trap/shoulder area pain.   Time 4   Period Weeks   Status New     CC Long Term Goal  #2   Title Patient will be knowledgeable about self-care techniques and tools available for managing her mild left UE lymphedema.   Time 4   Period Weeks   Status New     CC Long Term Goal  #3   Title left shoulder active flexion at least 150 degrees   Time 4   Period Weeks   Status New     CC Long Term Goal  #4   Title left shoulder active abduction at least 155 degrees   Time 4   Period Weeks   Status New            Plan - 04/25/16 1304    Clinical Impression Statement Pt states she is beginning to feel softeneing of fullness above her incision, but she continues with very adherent scaring with teleangiectasis evident.  She tolerated PROM To left shoulder today. She has an appointment with [facility] to address for compression bra to address fullness at anterior and lateral chest .    Rehab Potential Good    Clinical Impairments Affecting Rehab Potential previous left inflammatory breast cancer with ALND and radiation resulting in adherent scar    PT Frequency 2x / week   PT Duration  4 weeks   PT Treatment/Interventions ADLs/Self Care Home Management;Electrical Stimulation;DME Instruction;Therapeutic exercise;Patient/family education;Manual techniques;Manual lymph drainage;Scar mobilization;Passive range of motion;Taping;Compression bandaging   PT Next Visit Plan Consider kinesiotape.  Apply a small patch of tape on skin at beginning of treatment to see if pt able to tolerate before applying tape at end of treatment to laterala chest fullness Cont myofascial work, soft tissue and scar mobilization for left chest; soft tissue work and stretching for bilat. upper traps; instruct and review and cont manual lymph drainage for left UE; send pt to [facility] once script returned from doctor.    Consulted and Agree with Plan of Care Patient      Patient will benefit from skilled therapeutic intervention in order to improve the following deficits and impairments:  Pain, Increased fascial restricitons, Decreased range of motion, Increased edema  Visit Diagnosis: Disorder of the skin and subcutaneous tissue related to radiation, unspecified  Stiffness of left shoulder, not elsewhere classified  Other symptoms and signs involving the musculoskeletal system  Postmastectomy lymphedema  Left shoulder pain, unspecified chronicity     Problem List Patient Active Problem List   Diagnosis Date Noted  . Metastatic breast cancer ([place]) 09/24/2015  . 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 Personal history of breast cancer 08/23/2015  . 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 Degenerative arthritis of right shoulder region 01/13/2015  . Cancer of left female breast  ([place]) 07/27/2014  . 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 Rotator cuff  tear 06/10/2014  . Allergic rhinitis, seasonal 09/16/2013  . Depression 08/10/2013  . Diabetes mellitus type 2, uncomplicated ([place]) 44/97/5300  . Hyperlipidemia, unspecified 08/10/2013  . BP (high blood pressure) 08/10/2013  . Osteoporosis, post-menopausal 08/10/2013   Donato Heinz. Owens Shark PT  Norwood Levo 04/25/2016, 5:49 PM  [place], [place], [address] Phone: [phone]   Fax:  [phone]  Name: ERNISHA SORN MRN: [id number] Date of Birth: [birth year]

## 2016-04-27 ENCOUNTER — Encounter: Payer: Self-pay | Admitting: Physical Therapy

## 2016-04-27 ENCOUNTER — Ambulatory Visit: Payer: 59 | Admitting: Physical Therapy

## 2016-04-27 DIAGNOSIS — M25612 Stiffness of left shoulder, not elsewhere classified: Secondary | ICD-10-CM

## 2016-04-27 DIAGNOSIS — M25512 Pain in left shoulder: Secondary | ICD-10-CM | POA: Diagnosis not present

## 2016-04-27 DIAGNOSIS — I972 Postmastectomy lymphedema syndrome: Secondary | ICD-10-CM

## 2016-04-27 DIAGNOSIS — R29898 Other symptoms and signs involving the musculoskeletal system: Secondary | ICD-10-CM

## 2016-04-27 DIAGNOSIS — L599 Disorder of the skin and subcutaneous tissue related to radiation, unspecified: Secondary | ICD-10-CM

## 2016-04-27 NOTE — Therapy (Signed)
[place], [place], [address] Phone: [phone]   Fax:  [phone]  Physical Therapy Treatment  Patient Details  Name: Amanda [name] MRN: [id number] Date of Birth: 10-23-1950 Referring Provider: Dr. Irene Limbo (saw her about pain left chest)  Encounter Date: 04/27/2016      PT End of Session - 04/27/16 1104    Visit Number 5   Number of Visits 9   Date for PT Re-Evaluation 05/22/16   PT Start Time 1012   PT Stop Time 1110   PT Time Calculation (min) 58 min   Activity Tolerance Patient tolerated treatment well   Behavior During Therapy [facility] for tasks assessed/performed      Past Medical History:  Diagnosis Date  . Back pain    occasionally  . Breast cancer ([place])    left  . Depression    takes Paxil and Wellbutrin daily  . Diabetes ([place])    takes Metformin daily  . GERD (gastroesophageal reflux disease)   . History of bronchitis 2 yrs ago  . Hyperlipidemia    takes Atorvastatin daily  . Hypertension    takes Losartan-HCTZ daily  . Insomnia    takes Ambien nightly  . Insomnia    takes gabapentin nightly  . Joint pain   . Migraine   . Mood swings ([place])   . Osteoarthritis of knee   . PONV (postoperative nausea and vomiting)   . Seasonal allergies    takes Allegra daily    Past Surgical History:  Procedure Laterality Date  . BREAST BIOPSY  2009  . cataract surgery Bilateral   . COLONOSCOPY    . JOINT REPLACEMENT Left 2014   knee  . KNEE ARTHROSCOPY Left    x 5  . MASTECTOMY Left   . port a cath placed    . PORTACATH PLACEMENT N/A 09/17/2015   Procedure: INSERTION PORT-A-CATH;  Surgeon: Jules Husbands, MD;  Location: [facility];  Service: General;  Laterality: N/A;  . TOTAL SHOULDER ARTHROPLASTY Right 06/03/2015   Procedure: TOTAL SHOULDER ARTHROPLASTY;  Surgeon: Tania Ade, MD;  Location: [place];  Service: Orthopedics;  Laterality: Right;  Right total shoulder  arthroplasty  . TOTAL SHOULDER REPLACEMENT Right 06/03/2015  . TUBAL LIGATION      There were no vitals filed for this visit.      Subjective Assessment - 04/27/16 1019    Subjective The neuropathy in my feet is really bothering me. It effecting my sleep. I'm taking my medicine for it but it's not helping. My legs are killing me when I walk.   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  Then adjuvant radiation.  Was doing fine and when I saw my oncologist for my checkup, the doctor sent her for PT for lymphedema.  Did okay but didn't keep up with self-care for that. July 2017 noted an enlarged left neck lymph node. Had multiple scans and US guided biopsy and found metastatic disease. Metastasis is in L4.  Also had some in chest wall.  She denies other bony metastases, including the left shoulder. Was in a research study and had palliative radiation to her low back.  Had 3 month CAT scan on Monday, and the areas of concern have shrunk.  Has foot neuropathy.  Diabetes; HTN; h/o right total shoulder replacement, left knee replacement.   Patient Stated Goals To see what's  going on and to find out what I need to do. I don't want to wear the compression sleeve the rest of my life.   Currently in Pain? Yes   Pain Score 5    Pain Location Chest   Pain Orientation Left   Pain Descriptors / Indicators Sharp   Pain Type Chronic pain   Pain Onset More than a month ago   Pain Frequency Intermittent                         [facility] Adult PT Treatment/Exercise - 04/27/16 0001      Manual Therapy   Manual Therapy Manual Lymphatic Drainage (MLD);Passive ROM;Myofascial release;Taping   Manual therapy comments Kinesiotaping to left lateral trunk in fan shape wrapping around to mid back.  Tested skin with small piece of tape for 30 min prior to tap   Myofascial Release To left  chest moving skin various directions to loosen scar   Manual Lymphatic Drainage (MLD) In Supine: Short neck, superficial and deep abdominals, Lt inguinal and Rt axillary nodes, Lt axillo-inguinal and anterior inter-axillary anastomosis, then Lt UE from dorsal hand to lateral shoulder working proximal to distal then retracing all steps.  Then to sidelying for back and left lateral chest    Passive ROM To left shoulder focused on flexion and abduction.                        [facility] Term Clinic Goals - 04/13/16 1631      CC Long Term Goal  #1   Title Pt. will report at least 60% decrease in left upper trap/shoulder area pain.   Time 4   Period Weeks   Status New     CC Long Term Goal  #2   Title Patient will be knowledgeable about self-care techniques and tools available for managing her mild left UE lymphedema.   Time 4   Period Weeks   Status New     CC Long Term Goal  #3   Title left shoulder active flexion at least 150 degrees   Time 4   Period Weeks   Status New     CC Long Term Goal  #4   Title left shoulder active abduction at least 155 degrees   Time 4   Period Weeks   Status New            Plan - 04/27/16 1105    Clinical Impression Statement Patient's scar tissue on her left anterior chest appears to be impeding the flow of lymphatic fluid down her left axillo-inguinal pathway. She will benefit from continued PT to reduce how tight and bound down her scar is. This would improve lymphatic flow.   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    PT Duration 4 weeks   PT Treatment/Interventions ADLs/Self Care Home Management;Electrical Stimulation;DME Instruction;Therapeutic exercise;Patient/family education;Manual techniques;Manual lymph drainage;Scar mobilization;Passive range of motion;Taping;Compression bandaging   PT Next Visit Plan  Assess response ot kinesiotape. Continue with additional fan taping if helpful. Focus on scar tissue and reducing adherence. Patient to request order from MD for PT for neuropathy treatment.   Consulted and Agree with Plan of Care Patient      Patient will benefit from skilled therapeutic intervention in order to improve the following deficits and impairments:  Pain, Increased fascial restricitons, Decreased  range of motion, Increased edema  Visit Diagnosis: Disorder of the skin and subcutaneous tissue related to radiation, unspecified  Stiffness of left shoulder, not elsewhere classified  Other symptoms and signs involving the musculoskeletal system  Postmastectomy lymphedema  Left shoulder pain, unspecified chronicity     Problem List Patient Active Problem List   Diagnosis Date Noted  . Metastatic breast cancer ([place]) 09/24/2015  . 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 Personal history of breast cancer 08/23/2015  . 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 Degenerative arthritis of right shoulder region 01/13/2015  . Cancer of left female breast  ([place]) 07/27/2014  . 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 Rotator cuff tear 06/10/2014  . Allergic rhinitis, seasonal 09/16/2013  . Depression 08/10/2013  . Diabetes mellitus type 2, uncomplicated ([place]) 27/07/8673  . Hyperlipidemia, unspecified 08/10/2013  . BP (high blood pressure) 08/10/2013  . Osteoporosis, post-menopausal 08/10/2013    Annia Friendly, PT 04/27/16 11:13 AM  [place], [place], [address] Phone: [phone]   Fax:  [phone]  Name: Amanda [name] MRN: [id number] Date of Birth: [birth year]

## 2016-05-02 ENCOUNTER — Ambulatory Visit: Payer: 59

## 2016-05-02 DIAGNOSIS — M25512 Pain in left shoulder: Secondary | ICD-10-CM

## 2016-05-02 DIAGNOSIS — I972 Postmastectomy lymphedema syndrome: Secondary | ICD-10-CM

## 2016-05-02 DIAGNOSIS — L599 Disorder of the skin and subcutaneous tissue related to radiation, unspecified: Secondary | ICD-10-CM

## 2016-05-02 DIAGNOSIS — R29898 Other symptoms and signs involving the musculoskeletal system: Secondary | ICD-10-CM

## 2016-05-02 DIAGNOSIS — M25612 Stiffness of left shoulder, not elsewhere classified: Secondary | ICD-10-CM

## 2016-05-02 NOTE — Therapy (Signed)
[place], [place], [address] Phone: [phone]   Fax:  [phone]  Physical Therapy Treatment  Patient Details  Name: Amanda [name] MRN: [id number] Date of Birth: 12-10-1950 Referring Provider: Dr. Irene Limbo (saw her about pain left chest)  Encounter Date: 05/02/2016      PT End of Session - 05/02/16 1107    Visit Number 6   Number of Visits 9   Date for PT Re-Evaluation 05/22/16   PT Start Time 4496   PT Stop Time 1105   PT Time Calculation (min) 42 min   Activity Tolerance Patient tolerated treatment well   Behavior During Therapy [facility] for tasks assessed/performed      Past Medical History:  Diagnosis Date  . Back pain    occasionally  . Breast cancer ([place])    left  . Depression    takes Paxil and Wellbutrin daily  . Diabetes ([place])    takes Metformin daily  . GERD (gastroesophageal reflux disease)   . History of bronchitis 2 yrs ago  . Hyperlipidemia    takes Atorvastatin daily  . Hypertension    takes Losartan-HCTZ daily  . Insomnia    takes Ambien nightly  . Insomnia    takes gabapentin nightly  . Joint pain   . Migraine   . Mood swings ([place])   . Osteoarthritis of knee   . PONV (postoperative nausea and vomiting)   . Seasonal allergies    takes Allegra daily    Past Surgical History:  Procedure Laterality Date  . BREAST BIOPSY  2009  . cataract surgery Bilateral   . COLONOSCOPY    . JOINT REPLACEMENT Left 2014   knee  . KNEE ARTHROSCOPY Left    x 5  . MASTECTOMY Left   . port a cath placed    . PORTACATH PLACEMENT N/A 09/17/2015   Procedure: INSERTION PORT-A-CATH;  Surgeon: Jules Husbands, MD;  Location: [facility];  Service: General;  Laterality: N/A;  . TOTAL SHOULDER ARTHROPLASTY Right 06/03/2015   Procedure: TOTAL SHOULDER ARTHROPLASTY;  Surgeon: Tania Ade, MD;  Location: [place];  Service: Orthopedics;  Laterality: Right;  Right total shoulder  arthroplasty  . TOTAL SHOULDER REPLACEMENT Right 06/03/2015  . TUBAL LIGATION      There were no vitals filed for this visit.      Subjective Assessment - 05/02/16 1031    Subjective I had the worse Lt hip pain in the middle of the night Sunday. It's feeling better today but it really was bothering me yesterday. My feet feel a little better today. The tape is still on, I can't tell if it helped or not. But my swelling is softer at my Lt chest. Also have been using coconut oil 2x/day at my incision since [place] suggested it.    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  Then adjuvant radiation.  Was doing fine and when I saw my oncologist for my checkup, the doctor sent her for PT for lymphedema.  Did okay but didn't keep up with self-care for that. July 2017 noted an enlarged left neck lymph node. Had multiple scans and US guided biopsy and found metastatic disease. Metastasis is in L4.  Also had some in chest wall.  She denies other bony metastases, including the left shoulder. Was in a research study and had palliative radiation to her low back.  Had 3 month CAT scan on Monday, and the areas of concern have shrunk.  Has foot neuropathy.  Diabetes; HTN; h/o right total shoulder replacement, left knee replacement.   Patient Stated Goals To see what's going on and to find out what I need to do. I don't want to wear the compression sleeve the rest of my life.   Currently in Pain? Yes   Pain Score 4    Pain Location Arm   Pain Orientation Left   Pain Descriptors / Indicators Aching   Pain Type Chronic pain   Pain Onset More than a month ago   Pain Frequency Intermittent   Aggravating Factors  overusing arm   Pain Relieving Factors it has been slowly getting better                         [facility] Adult PT Treatment/Exercise - 05/02/16 0001      Manual Therapy    Manual Therapy Manual Lymphatic Drainage (MLD);Passive ROM;Myofascial release;Taping   Manual therapy comments Applied skincote first then Kinesiotaping to left lateral trunk in fan shape wrapping around to mid back.   Myofascial Release To left chest moving skin various directions to loosen scar   Manual Lymphatic Drainage (MLD) In Supine: Short neck, superficial and deep abdominals, Lt inguinal and Rt axillary nodes, Lt axillo-inguinal and anterior inter-axillary anastomosis, then Lt UE from dorsal hand to lateral shoulder working proximal to distal then retracing all steps.  Then to sidelying for back and left lateral chest    Passive ROM To left shoulder focused on flexion and abduction.                        Long Term Clinic Goals - 04/13/16 1631      CC Long Term Goal  #1   Title Pt. will report at least 60% decrease in left upper trap/shoulder area pain.   Time 4   Period Weeks   Status New     CC Long Term Goal  #2   Title Patient will be knowledgeable about self-care techniques and tools available for managing her mild left UE lymphedema.   Time 4   Period Weeks   Status New     CC Long Term Goal  #3   Title left shoulder active flexion at least 150 degrees   Time 4   Period Weeks   Status New     CC Long Term Goal  #4   Title left shoulder active abduction at least 155 degrees   Time 4   Period Weeks   Status New            Plan - 05/02/16 1107    Clinical Impression Statement Pts scar tissue at Lt chest wall continues to appearing to impede lymphatic fluid flow. So since pts skin tolerated tape well reapplied towards posterior inter-axillary anastomosis to help improve lymphatic flow. Pt continues to show benefit from therapy as her tissue has demonstrated softening over the weeks at her Lt chest wall.    Rehab Potential Good   Clinical Impairments Affecting Rehab Potential previous left inflammatory breast cancer with ALND and radiation  resulting in adherent scar    PT Frequency 2x / week   PT Duration 4 weeks   PT Treatment/Interventions ADLs/Self Care Home Management;Electrical Stimulation;DME Instruction;Therapeutic exercise;Patient/family education;Manual techniques;Manual lymph drainage;Scar mobilization;Passive range of motion;Taping;Compression bandaging   PT Next Visit Plan Cont kinesiotape and  additional fan taping if helpful. Focus on scar tissue and reducing adherence. Patient to request order from MD for PT for neuropathy treatment.   Consulted and Agree with Plan of Care Patient      Patient will benefit from skilled therapeutic intervention in order to improve the following deficits and impairments:  Pain, Increased fascial restricitons, Decreased range of motion, Increased edema  Visit Diagnosis: Disorder of the skin and subcutaneous tissue related to radiation, unspecified  Stiffness of left shoulder, not elsewhere classified  Other symptoms and signs involving the musculoskeletal system  Postmastectomy lymphedema  Left shoulder pain, unspecified chronicity     Problem List Patient Active Problem List   Diagnosis Date Noted  . Metastatic breast cancer ([place]) 09/24/2015  . 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 Personal history of breast cancer 08/23/2015  . 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 Degenerative arthritis of right shoulder region 01/13/2015  . Cancer of left female breast  ([place]) 07/27/2014  . 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 Rotator cuff tear 06/10/2014  . Allergic rhinitis, seasonal 09/16/2013  . Depression 08/10/2013  . Diabetes mellitus type 2, uncomplicated ([place]) 75/79/7282  . Hyperlipidemia, unspecified 08/10/2013  . BP (high blood pressure) 08/10/2013  . Osteoporosis, post-menopausal 08/10/2013    Otelia Limes, PTA 05/02/2016, 11:11 AM  [place], [place], [address] Phone: [phone]   Fax:  [phone]  Name: Amanda [name] MRN: [id number] Date of Birth: 08/11/1950

## 2016-05-04 ENCOUNTER — Ambulatory Visit: Payer: 59 | Admitting: Physical Therapy

## 2016-05-04 ENCOUNTER — Encounter: Payer: Self-pay | Admitting: Physical Therapy

## 2016-05-04 DIAGNOSIS — M25512 Pain in left shoulder: Secondary | ICD-10-CM | POA: Diagnosis not present

## 2016-05-04 DIAGNOSIS — L599 Disorder of the skin and subcutaneous tissue related to radiation, unspecified: Secondary | ICD-10-CM

## 2016-05-04 DIAGNOSIS — M25612 Stiffness of left shoulder, not elsewhere classified: Secondary | ICD-10-CM

## 2016-05-04 NOTE — Therapy (Signed)
[place], [place], [address] Phone: [phone]   Fax:  [phone]  Physical Therapy Treatment  Patient Details  Name: Amanda [name] MRN: [id number] Date of Birth: [birth year] Referring Provider: Dr. Irene Limbo (saw her about pain left chest)  Encounter Date: 05/04/2016      PT End of Session - 05/04/16 1102    Visit Number 7   Number of Visits 9   Date for PT Re-Evaluation 05/22/16   PT Start Time 1020   PT Stop Time 1102   PT Time Calculation (min) 42 min   Activity Tolerance Patient tolerated treatment well   Behavior During Therapy [facility] for tasks assessed/performed      Past Medical History:  Diagnosis Date  . Back pain    occasionally  . Breast cancer ([place])    left  . Depression    takes Paxil and Wellbutrin daily  . Diabetes ([place])    takes Metformin daily  . GERD (gastroesophageal reflux disease)   . History of bronchitis 2 yrs ago  . Hyperlipidemia    takes Atorvastatin daily  . Hypertension    takes Losartan-HCTZ daily  . Insomnia    takes Ambien nightly  . Insomnia    takes gabapentin nightly  . Joint pain   . Migraine   . Mood swings ([place])   . Osteoarthritis of knee   . PONV (postoperative nausea and vomiting)   . Seasonal allergies    takes Allegra daily    Past Surgical History:  Procedure Laterality Date  . BREAST BIOPSY  2009  . cataract surgery Bilateral   . COLONOSCOPY    . JOINT REPLACEMENT Left 2014   knee  . KNEE ARTHROSCOPY Left    x 5  . MASTECTOMY Left   . port a cath placed    . PORTACATH PLACEMENT N/A 09/17/2015   Procedure: INSERTION PORT-A-CATH;  Surgeon: Jules Husbands, MD;  Location: [facility];  Service: General;  Laterality: N/A;  . TOTAL SHOULDER ARTHROPLASTY Right 06/03/2015   Procedure: TOTAL SHOULDER ARTHROPLASTY;  Surgeon: Tania Ade, MD;  Location: [place];  Service: Orthopedics;  Laterality: Right;  Right total shoulder  arthroplasty  . TOTAL SHOULDER REPLACEMENT Right 06/03/2015  . TUBAL LIGATION      There were no vitals filed for this visit.      Subjective Assessment - 05/04/16 1020    Subjective My pain is okay. I am okay. My left side is still really tight. I don't know why.    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  Then adjuvant radiation.  Was doing fine and when I saw my oncologist for my checkup, the doctor sent her for PT for lymphedema.  Did okay but didn't keep up with self-care for that. July 2017 noted an enlarged left neck lymph node. Had multiple scans and US guided biopsy and found metastatic disease. Metastasis is in L4.  Also had some in chest wall.  She denies other bony metastases, including the left shoulder. Was in a research study and had palliative radiation to her low back.  Had 3 month CAT scan on Monday, and the areas of concern have shrunk.  Has foot neuropathy.  Diabetes; HTN; h/o right total shoulder replacement, left knee replacement.   Patient Stated Goals To see what's going on and to find out what I need to do. I  don't want to wear the compression sleeve the rest of my life.   Currently in Pain? Yes   Pain Score 3    Pain Location Arm   Pain Orientation Left   Pain Descriptors / Indicators Aching   Pain Type Chronic pain   Pain Onset More than a month ago   Pain Frequency Intermittent                         [facility] Adult PT Treatment/Exercise - 05/04/16 0001      Manual Therapy   Manual Therapy Manual Lymphatic Drainage (MLD);Passive ROM;Myofascial release;Taping   Manual therapy comments Applied skincote first then Kinesiotaping to left lateral trunk in fan shape wrapping around to mid back.   Myofascial Release To left chest moving skin various directions to loosen scar   Manual Lymphatic Drainage (MLD) In Supine: Short neck,  superficial and deep abdominals, Lt inguinal and Rt axillary nodes, Lt axillo-inguinal and anterior inter-axillary anastomosis, then Lt UE from dorsal hand to lateral shoulder working proximal to distal then retracing all steps.  Then to sidelying for back and left lateral chest    Passive ROM To left shoulder focused on flexion and abduction.                        Long Term Clinic Goals - 04/13/16 1631      CC Long Term Goal  #1   Title Pt. will report at least 60% decrease in left upper trap/shoulder area pain.   Time 4   Period Weeks   Status New     CC Long Term Goal  #2   Title Patient will be knowledgeable about self-care techniques and tools available for managing her mild left UE lymphedema.   Time 4   Period Weeks   Status New     CC Long Term Goal  #3   Title left shoulder active flexion at least 150 degrees   Time 4   Period Weeks   Status New     CC Long Term Goal  #4   Title left shoulder active abduction at least 155 degrees   Time 4   Period Weeks   Status New            Plan - 05/04/16 1129    Clinical Impression Statement Pt continues to demonstrate increased scar tissue at left chest wall. Continued with scar mobilization and soft tissue mobilization to this area using myofascial release and biotone. Reapplied kinesiotape following MLD to help decrease fullness of left chest and lateral trunk.    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    PT Frequency 2x / week   PT Duration 4 weeks   PT Treatment/Interventions ADLs/Self Care Home Management;Electrical Stimulation;DME Instruction;Therapeutic exercise;Patient/family education;Manual techniques;Manual lymph drainage;Scar mobilization;Passive range of motion;Taping;Compression bandaging   PT Next Visit Plan Cont kinesiotape and additional fan taping if helpful. Focus on  scar tissue and reducing adherence. Patient to request order from MD for PT for neuropathy treatment.   Consulted and Agree with Plan of Care Patient      Patient will benefit from skilled therapeutic intervention in order to improve the following deficits and impairments:  Pain, Increased fascial restricitons, Decreased range of motion, Increased edema  Visit Diagnosis: Disorder of the skin and subcutaneous tissue related to radiation, unspecified  Stiffness of left shoulder, not elsewhere classified     Problem List Patient Active Problem List   Diagnosis Date Noted  . Metastatic breast cancer ([place]) 09/24/2015  . 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 Personal history of breast cancer 08/23/2015  . 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 Degenerative arthritis of right shoulder region 01/13/2015  . Cancer of left female breast  ([place]) 07/27/2014  . 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 Rotator cuff tear 06/10/2014  . Allergic rhinitis, seasonal 09/16/2013  . Depression 08/10/2013  . Diabetes mellitus type 2, uncomplicated ([place]) 09/73/5329  . Hyperlipidemia, unspecified 08/10/2013  . BP (high blood pressure) 08/10/2013  . Osteoporosis, post-menopausal 08/10/2013    Allyson Sabal [facility] 05/04/2016, 11:31 AM  [place], [place], [address] Phone: [phone]   Fax:  [phone]  Name: Amanda [name] MRN: [id number] Date of Birth: [birth year]  Manus Gunning, PT 05/04/16 11:32 AM

## 2016-05-09 ENCOUNTER — Ambulatory Visit: Payer: 59 | Admitting: Physical Therapy

## 2016-05-09 DIAGNOSIS — I972 Postmastectomy lymphedema syndrome: Secondary | ICD-10-CM

## 2016-05-09 DIAGNOSIS — M25512 Pain in left shoulder: Secondary | ICD-10-CM

## 2016-05-09 DIAGNOSIS — L599 Disorder of the skin and subcutaneous tissue related to radiation, unspecified: Secondary | ICD-10-CM

## 2016-05-09 DIAGNOSIS — M25612 Stiffness of left shoulder, not elsewhere classified: Secondary | ICD-10-CM

## 2016-05-09 DIAGNOSIS — R29898 Other symptoms and signs involving the musculoskeletal system: Secondary | ICD-10-CM

## 2016-05-09 NOTE — Patient Instructions (Addendum)
Over Head Pull: Narrow and Wide Grip   Cancer Rehab [phone]   On back, knees bent, feet flat, band across thighs, elbows straight but relaxed. Pull hands apart (start). Keeping elbows straight, bring arms up and over head, hands toward floor. Keep pull steady on band. Hold momentarily. Return slowly, keeping pull steady, back to start. Then do same with a wider grip on the band (past shoulder width) Repeat _5-10__ times. Band color __yellow____   Side Pull: Double Arm   On back, knees bent, feet flat. Arms perpendicular to body, shoulder level, elbows straight but relaxed. Pull arms out to sides, elbows straight. Resistance band comes across collarbones, hands toward floor. Hold momentarily. Slowly return to starting position. Repeat _5-10__ times. Band color _yellow____   Sword   On back, knees bent, feet flat, left hand on left hip, right hand above left. Pull right arm DIAGONALLY (hip to shoulder) across chest. Bring right arm along head toward floor. Hold momentarily. Slowly return to starting position. Repeat _5-10__ times. Do with left arm. Band color _yellow_____   Shoulder Rotation: Double Arm   On back, knees bent, feet flat, elbows tucked at sides, bent 90, hands palms up. Pull hands apart and down toward floor, keeping elbows near sides. Hold momentarily. Slowly return to starting position. Repeat _5-10__ times. Band color __yellow____      Flexion (Eccentric) - Active-Assist (Cane)          Cancer Rehab [phone]    Use unaffected arm to push affected arm forward. Avoid hiking shoulder (shoulder should NOT touch cheek). Keep palm relaxed. Slowly lower affected arm. Hold stretch for _5_ seconds repeating _5-10_ times, _1-2_ times a day.  Abduction (Eccentric) - Active-Assist (Cane)    Use unaffected arm to push affected arm out to side. Avoid hiking shoulder (shoulder should NOT touch cheek). Keep palm relaxed. Slowly lower affected arm. Hold stretch _5_ seconds  repeating _5-10_ times, _1-2_ times a day.  Cane Exercise: Extension   Stand holding cane behind back with both hands palm-up. Lift the cane away from body until gentle stretch felt. Do NOT lean forward.  Hold __5__ seconds. Repeat _5-10___ times. Do _1-2___ sessions per day.  CHEST: Doorway, Bilateral - Standing    Standing in doorway, place hands on wall with elbows bent at shoulder height and place one foot in front of other. Shift weight onto front foot. Hold _10-20__ seconds. Do _3-5_ times, _1-2_ times a day.

## 2016-05-09 NOTE — Therapy (Signed)
[place], [place], [address] Phone: [phone]   Fax:  [phone]  Physical Therapy Treatment  Patient Details  Name: Amanda [name] MRN: [id number] Date of Birth: [birth year] Referring Provider: Dr. Irene Limbo (saw her about pain left chest)  Encounter Date: 05/09/2016    Past Medical History:  Diagnosis Date  . Back pain    occasionally  . Breast cancer ([place])    left  . Depression    takes Paxil and Wellbutrin daily  . Diabetes ([place])    takes Metformin daily  . GERD (gastroesophageal reflux disease)   . History of bronchitis 2 yrs ago  . Hyperlipidemia    takes Atorvastatin daily  . Hypertension    takes Losartan-HCTZ daily  . Insomnia    takes Ambien nightly  . Insomnia    takes gabapentin nightly  . Joint pain   . Migraine   . Mood swings ([place])   . Osteoarthritis of knee   . PONV (postoperative nausea and vomiting)   . Seasonal allergies    takes Allegra daily    Past Surgical History:  Procedure Laterality Date  . BREAST BIOPSY  2009  . cataract surgery Bilateral   . COLONOSCOPY    . JOINT REPLACEMENT Left 2014   knee  . KNEE ARTHROSCOPY Left    x 5  . MASTECTOMY Left   . port a cath placed    . PORTACATH PLACEMENT N/A 09/17/2015   Procedure: INSERTION PORT-A-CATH;  Surgeon: Jules Husbands, MD;  Location: [facility];  Service: General;  Laterality: N/A;  . TOTAL SHOULDER ARTHROPLASTY Right 06/03/2015   Procedure: TOTAL SHOULDER ARTHROPLASTY;  Surgeon: Tania Ade, MD;  Location: [place];  Service: Orthopedics;  Laterality: Right;  Right total shoulder arthroplasty  . TOTAL SHOULDER REPLACEMENT Right 06/03/2015  . TUBAL LIGATION      There were no vitals filed for this visit.      Subjective Assessment - 05/09/16 1302    Subjective Pt states she Dr. 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  Then adjuvant radiation.  Was doing fine and when I saw my oncologist for my checkup, the doctor sent her for PT for lymphedema.  Did okay but didn't keep up with self-care for that. July 2017 noted an enlarged left neck lymph node. Had multiple scans and US guided biopsy and found metastatic disease. Metastasis is in L4.  Also had some in chest wall.  She denies other bony metastases, including the left shoulder. Was in a research study and had palliative radiation to her low back.  Had 3 month CAT scan on Monday, and the areas of concern have shrunk.  Has foot neuropathy.  Diabetes; HTN; h/o right total shoulder replacement, left knee replacement.   Patient Stated Goals To see what's going on and to find out what I need to do. I don't want to wear the compression sleeve the rest of my life.   Currently in Pain? Yes   Pain Score 4    Pain Location Arm   Pain Orientation Left   Pain Descriptors / Indicators Aching   Pain Onset More than a month ago   Pain Frequency Intermittent  Aggravating Factors  woke her up last night in her sleep    Pain Relieving Factors hoping the gabapentin with help             [facility] PT Assessment - 05/09/16 0001      AROM   Left Shoulder Flexion 153 Degrees  with encouragement    Left Shoulder ABduction 155 Degrees  with encouragement                      [facility] Adult PT Treatment/Exercise - 05/09/16 0001      Self-Care   Self-Care Other Self-Care Comments   Other Self-Care Comments  showed pt WearEase bra and Solaris swell spot to wear at anterior chest  talked with pt about doing aqua aerobics  at [facility] when she is completed here and pt agrees.      Shoulder Exercises: Supine   Horizontal ABduction Both;5 reps;Theraband   Theraband  Level (Shoulder Horizontal ABduction) Level 1 (Yellow)   External Rotation Strengthening;Both;5 reps;Theraband   Theraband Level (Shoulder External Rotation) Level 1 (Yellow)   Flexion Strengthening;Both;5 reps;Theraband  narrow and wide grip    Theraband Level (Shoulder Flexion) Level 1 (Yellow)   Flexion Limitations encouraged pt to stay within pain limits    Other Supine Exercises diagonal elevation with yellow theraband 3 reps with each arm    Other Supine Exercises dowel rod felxion stretch      Manual Therapy   Myofascial Release To left chest moving skin various directions to loosen scar   Manual Lymphatic Drainage (MLD) In Supine: Short neck, superficial and deep abdominals, Lt inguinal and Rt axillary nodes, Lt axillo-inguinal and anterior inter-axillary anastomosis, then Lt UE from dorsal hand to lateral shoulder working proximal to distal then retracing all steps.  Then to sidelying for back and left lateral chest    Passive ROM To left shoulder focused on flexion and abduction.                        Long Term Clinic Goals - 05/09/16 1306      CC Long Term Goal  #1   Title Pt. will report at least 60% decrease in left upper trap/shoulder area pain.   Baseline still having pain in feet and left arm    Time 4   Period Weeks   Status On-going     CC Long Term Goal  #2   Title Patient will be knowledgeable about self-care techniques and tools available for managing her mild left UE lymphedema.   Status On-going     CC Long Term Goal  #3   Title left shoulder active flexion at least 150 degrees   Baseline 153 on  05/09/2016   Status Achieved     CC Long Term Goal  #4   Title left shoulder active abduction at least 155 degrees   Baseline 155 on 05/09/2016   Status Achieved            Plan - 05/09/16 1705    Clinical Impression Statement Pt continues with scar tissue at left chest wall with fullness above it.  She feels that it is softer with the use  of kinesiotape but it is still present.  Did not use tape today to see if softness continues without it.  Upgraded prgram to include ROM and strengthening program and educated pt about what to look for in a compression bra. She would like to continue  exercise with aquatic therapy at [facility] when sessions are completed here.    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    PT Frequency 2x / week   PT Duration 4 weeks   PT Treatment/Interventions ADLs/Self Care Home Management;Electrical Stimulation;DME Instruction;Therapeutic exercise;Patient/family education;Manual techniques;Manual lymph drainage;Scar mobilization;Passive range of motion;Taping;Compression bandaging   PT Next Visit Plan Assess how she did without [place].  Review HEP program and make adjustments.  Assess goals.  Focus on scar tissue and reducing adherence. Patient to request order from MD for PT for neuropathy treatment.   Consulted and Agree with Plan of Care Patient      Patient will benefit from skilled therapeutic intervention in order to improve the following deficits and impairments:  Pain, Increased fascial restricitons, Decreased range of motion, Increased edema  Visit Diagnosis: Stiffness of left shoulder, not elsewhere classified  Disorder of the skin and subcutaneous tissue related to radiation, unspecified  Other symptoms and signs involving the musculoskeletal system  Postmastectomy lymphedema  Left shoulder pain, unspecified chronicity     Problem List Patient Active Problem List   Diagnosis Date Noted  . Metastatic breast cancer ([place]) 09/24/2015  . 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 Personal history of breast cancer 08/23/2015  . 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 Degenerative arthritis of right shoulder region 01/13/2015  . Cancer of  left female breast  ([place]) 07/27/2014  . 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 Rotator cuff tear 06/10/2014  . Allergic rhinitis, seasonal 09/16/2013  . Depression 08/10/2013  . Diabetes mellitus type 2, uncomplicated ([place]) 69/48/5462  . Hyperlipidemia, unspecified 08/10/2013  . BP (high blood pressure) 08/10/2013  . Osteoporosis, post-menopausal 08/10/2013   Donato Heinz. Owens Shark PT  Norwood Levo 05/09/2016, 5:13 PM  [place], [place], [address] Phone: [phone]   Fax:  [phone]  Name: HAREEM SUROWIEC MRN: [id number] Date of Birth: 08/20/1950

## 2016-05-11 ENCOUNTER — Ambulatory Visit: Payer: 59 | Admitting: Physical Therapy

## 2016-05-11 DIAGNOSIS — L599 Disorder of the skin and subcutaneous tissue related to radiation, unspecified: Secondary | ICD-10-CM

## 2016-05-11 DIAGNOSIS — M25512 Pain in left shoulder: Secondary | ICD-10-CM | POA: Diagnosis not present

## 2016-05-11 DIAGNOSIS — M25612 Stiffness of left shoulder, not elsewhere classified: Secondary | ICD-10-CM

## 2016-05-11 DIAGNOSIS — R29898 Other symptoms and signs involving the musculoskeletal system: Secondary | ICD-10-CM

## 2016-05-11 DIAGNOSIS — I972 Postmastectomy lymphedema syndrome: Secondary | ICD-10-CM

## 2016-05-11 NOTE — Therapy (Signed)
[place], [place], [address] Phone: [phone]   Fax:  [phone]  Physical Therapy Treatment  Patient Details  Name: Amanda [name] MRN: [id number] Date of Birth: 1950-09-21 Referring Provider: Dr. Irene Limbo (saw her about pain left chest)  Encounter Date: 05/11/2016      PT End of Session - 05/11/16 1747    Visit Number 8   Number of Visits 9   Date for PT Re-Evaluation 05/22/16   PT Start Time 2694   PT Stop Time 1100   PT Time Calculation (min) 45 min   Activity Tolerance Patient tolerated treatment well   Behavior During Therapy [facility] for tasks assessed/performed      Past Medical History:  Diagnosis Date  . Back pain    occasionally  . Breast cancer ([place])    left  . Depression    takes Paxil and Wellbutrin daily  . Diabetes ([place])    takes Metformin daily  . GERD (gastroesophageal reflux disease)   . History of bronchitis 2 yrs ago  . Hyperlipidemia    takes Atorvastatin daily  . Hypertension    takes Losartan-HCTZ daily  . Insomnia    takes Ambien nightly  . Insomnia    takes gabapentin nightly  . Joint pain   . Migraine   . Mood swings ([place])   . Osteoarthritis of knee   . PONV (postoperative nausea and vomiting)   . Seasonal allergies    takes Allegra daily    Past Surgical History:  Procedure Laterality Date  . BREAST BIOPSY  2009  . cataract surgery Bilateral   . COLONOSCOPY    . JOINT REPLACEMENT Left 2014   knee  . KNEE ARTHROSCOPY Left    x 5  . MASTECTOMY Left   . port a cath placed    . PORTACATH PLACEMENT N/A 09/17/2015   Procedure: INSERTION PORT-A-CATH;  Surgeon: Jules Husbands, MD;  Location: [facility];  Service: General;  Laterality: N/A;  . TOTAL SHOULDER ARTHROPLASTY Right 06/03/2015   Procedure: TOTAL SHOULDER ARTHROPLASTY;  Surgeon: Tania Ade, MD;  Location: [place];  Service: Orthopedics;  Laterality: Right;  Right total shoulder  arthroplasty  . TOTAL SHOULDER REPLACEMENT Right 06/03/2015  . TUBAL LIGATION      There were no vitals filed for this visit.      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  Then adjuvant radiation.  Was doing fine and when I saw my oncologist for my checkup, the doctor sent her for PT for lymphedema.  Did okay but didn't keep up with self-care for that. July 2017 noted an enlarged left neck lymph node. Had multiple scans and US guided biopsy and found metastatic disease. Metastasis is in L4.  Also had some in chest wall.  She denies other bony metastases, including the left shoulder. Was in a research study and had palliative radiation to her low back.  Had 3 month CAT scan on Monday, and the areas of concern have shrunk.  Has foot neuropathy.  Diabetes; HTN; h/o right total shoulder replacement, left knee replacement.   Patient Stated Goals To see what's going on and to find out what I need to do. I don't want to  wear the compression sleeve the rest of my life.   Currently in Pain? Yes   Pain Score 8    Pain Location Arm   Pain Orientation Left   Pain Type Acute pain   Pain Radiating Towards no   Pain Onset In the past 7 days   Pain Frequency Constant   Aggravating Factors  exercise with the yellow band    Multiple Pain Sites Yes            [facility] PT Assessment - 05/11/16 0001      AROM   Left Shoulder ABduction --  with encouragement            LYMPHEDEMA/ONCOLOGY QUESTIONNAIRE - 05/11/16 1020      Left Upper Extremity Lymphedema   15 cm Proximal to Olecranon Process 41 cm   10 cm Proximal to Olecranon Process 39.5 cm   Olecranon Process 27.7 cm   10 cm Proximal to Ulnar Styloid Process 26 cm   Just Proximal to Ulnar Styloid  Process 17.2 cm   Across Hand at [employer] 20.3 cm   At [place] of 2nd Digit 6.9 cm                  [facility] Adult PT Treatment/Exercise - 05/11/16 0001      Self-Care   Self-Care Other Self-Care Comments   Other Self-Care Comments  pt demonstrated Medi donning butler with large Tg soft which was doubled over to provide support to fullness in upper arm  reviewed self manual lymph drainage technques.      Shoulder Exercises: Supine   Horizontal ABduction Both;5 reps   External Rotation Strengthening;Both;5 reps   Flexion Strengthening;Both;5 reps  narrow and wide grip    Other Supine Exercises diagonal elevation with yellow theraband 3 reps with each arm    Other Supine Exercises dowel rod felxion stretch      Manual Therapy   Manual therapy comments Reviewed self manual lymph drainage techniques and pt was able to do them independently                 PT Education - 05/11/16 1044    Education provided Yes   Education Details how to get a Bella Strong sleeve and gauntlet., reviewed technique for HEP for supine scapular series and  how to use the Medi donning sleeve butler    Person(s) Educated Patient   Methods Explanation;Handout   Comprehension Verbalized understanding                [place] - 05/09/16 1306      CC Long Term Goal  #1   Title Pt. will report at least 60% decrease in left upper trap/shoulder area pain.   Baseline still having pain in feet and left arm    Time 4   Period Weeks   Status On-going     CC Long Term Goal  #2   Title Patient will be knowledgeable about self-care techniques and tools available for managing her mild left UE lymphedema.   Status On-going     CC Long Term Goal  #3   Title left shoulder active flexion at least 150 degrees   Baseline 153 on  05/09/2016   Status Achieved     CC Long Term Goal  #4   Title left shoulder active abduction at least 155 degrees   Baseline 155 on 05/09/2016    Status Achieved  Plan - 05/11/16 1748    Clinical Impression Statement Pt has not noticed a difference in her fullness without the kinesiotape so it will not be reapplied.  She continues with tight scarring at chest and wants to talk with Dr. Iran Planas to see if there is anything that can be done surgically. She is going to get a compression bra today and to see about a compression sleeve. She did have increase in circumference to left upper arm today so TG soft was applied to see if would reduceas  It may have come after muscle irritation from exercises  She will soon be ready to discharge from our service and follow up with aquatic therapy at [facility] and can get a referral from her oncologist if needed    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    PT Frequency 2x / week   PT Duration 4 weeks   PT Treatment/Interventions ADLs/Self Care Home Management;Electrical Stimulation;DME Instruction;Therapeutic exercise;Patient/family education;Manual techniques;Manual lymph drainage;Scar mobilization;Passive range of motion;Taping;Compression bandaging   PT Next Visit Plan  Review HEP program and make adjustments.  Assess goals.  Focus on scar tissue and reducing adherence. Patient to request order from MD for PT for neuropathy treatment.   Consulted and Agree with Plan of Care Patient      Patient will benefit from skilled therapeutic intervention in order to improve the following deficits and impairments:  Pain, Increased fascial restricitons, Decreased range of motion, Increased edema  Visit Diagnosis: Stiffness of left shoulder, not elsewhere classified  Disorder of the skin and subcutaneous tissue related to radiation, unspecified  Other symptoms and signs involving the musculoskeletal system  Postmastectomy lymphedema  Left shoulder pain, unspecified  chronicity     Problem List Patient Active Problem List   Diagnosis Date Noted  . Metastatic breast cancer ([place]) 09/24/2015  . 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 Personal history of breast cancer 08/23/2015  . 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 Degenerative arthritis of right shoulder region 01/13/2015  . Cancer of left female breast  ([place]) 07/27/2014  . 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 Rotator cuff tear 06/10/2014  . Allergic rhinitis, seasonal 09/16/2013  . Depression 08/10/2013  . Diabetes mellitus type 2, uncomplicated ([place]) 69/62/9528  . Hyperlipidemia, unspecified 08/10/2013  . BP (high blood pressure) 08/10/2013  . Osteoporosis, post-menopausal 08/10/2013   Donato Heinz. Owens Shark PT  Norwood Levo 05/11/2016, 5:56 PM  [place], [place], [address] Phone: [phone]   Fax:  [phone]  Name: REWA WEISSBERG MRN: [id number] Date of Birth: [birth year]

## 2016-05-11 NOTE — Patient Instructions (Addendum)
Please look at a class II [employer] Bella Strong compression sleeve and gauntlet   Medi donning butler might help you getting the sleeve on

## 2016-05-16 ENCOUNTER — Ambulatory Visit: Payer: 59 | Attending: Plastic Surgery | Admitting: Physical Therapy

## 2016-05-16 DIAGNOSIS — R29898 Other symptoms and signs involving the musculoskeletal system: Secondary | ICD-10-CM | POA: Diagnosis present

## 2016-05-16 DIAGNOSIS — L599 Disorder of the skin and subcutaneous tissue related to radiation, unspecified: Secondary | ICD-10-CM | POA: Insufficient documentation

## 2016-05-16 DIAGNOSIS — M25612 Stiffness of left shoulder, not elsewhere classified: Secondary | ICD-10-CM | POA: Diagnosis not present

## 2016-05-16 DIAGNOSIS — I972 Postmastectomy lymphedema syndrome: Secondary | ICD-10-CM | POA: Diagnosis present

## 2016-05-16 DIAGNOSIS — M25512 Pain in left shoulder: Secondary | ICD-10-CM | POA: Diagnosis present

## 2016-05-16 NOTE — Therapy (Signed)
[place], [place], [address] Phone: [phone]   Fax:  [phone]  Physical Therapy Treatment  Patient Details  Name: Amanda [name] MRN: [id number] Date of Birth: May 09, 1950 Referring Provider: Dr. Irene Limbo (saw her about pain left chest)  Encounter Date: 05/16/2016      PT End of Session - 05/16/16 1733    Visit Number 9   Number of Visits 9   Date for PT Re-Evaluation 05/22/16   PT Start Time 3235   PT Stop Time 1100   PT Time Calculation (min) 45 min   Activity Tolerance Patient tolerated treatment well   Behavior During Therapy [facility] for tasks assessed/performed      Past Medical History:  Diagnosis Date  . Back pain    occasionally  . Breast cancer ([place])    left  . Depression    takes Paxil and Wellbutrin daily  . Diabetes ([place])    takes Metformin daily  . GERD (gastroesophageal reflux disease)   . History of bronchitis 2 yrs ago  . Hyperlipidemia    takes Atorvastatin daily  . Hypertension    takes Losartan-HCTZ daily  . Insomnia    takes Ambien nightly  . Insomnia    takes gabapentin nightly  . Joint pain   . Migraine   . Mood swings ([place])   . Osteoarthritis of knee   . PONV (postoperative nausea and vomiting)   . Seasonal allergies    takes Allegra daily    Past Surgical History:  Procedure Laterality Date  . BREAST BIOPSY  2009  . cataract surgery Bilateral   . COLONOSCOPY    . JOINT REPLACEMENT Left 2014   knee  . KNEE ARTHROSCOPY Left    x 5  . MASTECTOMY Left   . port a cath placed    . PORTACATH PLACEMENT N/A 09/17/2015   Procedure: INSERTION PORT-A-CATH;  Surgeon: Jules Husbands, MD;  Location: [facility] ORS;  Service: General;  Laterality: N/A;  . TOTAL SHOULDER ARTHROPLASTY Right 06/03/2015   Procedure: TOTAL SHOULDER ARTHROPLASTY;  Surgeon: Tania Ade, MD;  Location: [place];  Service: Orthopedics;  Laterality: Right;  Right total shoulder  arthroplasty  . TOTAL SHOULDER REPLACEMENT Right 06/03/2015  . TUBAL LIGATION      There were no vitals filed for this visit.      Subjective Assessment - 05/16/16 1039    Subjective pt comes in wearing her WearEase bra that seems to fit well around her underarm and lateral chest.  She got other bras also. She got her sleeve measured too  she is wearing the Tg soft for compression at home and it is working well.    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  Then adjuvant radiation.  Was doing fine and when I saw my oncologist for my checkup, the doctor sent her for PT for lymphedema.  Did okay but didn't keep up with self-care for that. July 2017 noted an enlarged left neck lymph node. Had multiple scans and US guided biopsy and found metastatic disease. Metastasis is in L4.  Also had some in chest wall.  She denies other bony metastases, including the left shoulder. Was in a research study and had palliative radiation to her low back.  Had 3 month CAT scan on Monday, and the areas of concern have shrunk.  Has foot neuropathy.  Diabetes; HTN;  h/o right total shoulder replacement, left knee replacement.   Patient Stated Goals To see what's going on and to find out what I need to do. I don't want to wear the compression sleeve the rest of my life.   Currently in Pain? Yes   Pain Location Arm   Pain Orientation Left                         [facility] Adult PT Treatment/Exercise - 05/16/16 0001      Self-Care   Self-Care Other Self-Care Comments   Other Self-Care Comments  Pt comes in with Wear Ease compression bra with jovi pak lateral chest pad to wear over her scar and fullness of anterior chest. She is not totally pleased as the bra pulls to the right side and does not offer good support to her right breast.  However it does fit nicely under left axilla and lateral  chest so  I feel that it will provide the compression it is intended for.  Pt will have other styles of bras that she could wear at other times when she wants a better aesthetic appearance. Pt has gotten relief from Tg soft compression at nighttime and do not feel she needs a full nighttime gament.  She can get rolls of tg soft to use as she needs      Shoulder Exercises: Standing   Other Standing Exercises AROM of shoulders without difficulty                         Long Term Clinic Goals - 05/09/16 1306      CC Long Term Goal  #1   Title Pt. will report at least 60% decrease in left upper trap/shoulder area pain.   Baseline still having pain in feet and left arm    Time 4   Period Weeks   Status On-going     CC Long Term Goal  #2   Title Patient will be knowledgeable about self-care techniques and tools available for managing her mild left UE lymphedema.   Status On-going     CC Long Term Goal  #3   Title left shoulder active flexion at least 150 degrees   Baseline 153 on  05/09/2016   Status Achieved     CC Long Term Goal  #4   Title left shoulder active abduction at least 155 degrees   Baseline 155 on 05/09/2016   Status Achieved            Plan - 05/16/16 1733    Clinical Impression Statement Pt now has her compression bra and will wear it to help contol the fullness in her anterior and lateral chest. He tight scar persists with fullness above it.  She has a compression sleeve on order and will use tg soft to help contol nighttime discomfort. Do not feel she needs a manuafactured nighttime garment, She wants to do Aquatic Therapy at [facility] so note sent to Dr. Grayland Ormond to send a referral there and talked to therapists there to smooth her transition.  Pt ready to discharge after one more session    Clinical Impairments Affecting Rehab Potential previous left inflammatory breast cancer with ALND and radiation resulting in adherent scar    PT Duration 4 weeks   PT  Treatment/Interventions ADLs/Self Care Home Management;Electrical Stimulation;DME Instruction;Therapeutic exercise;Patient/family education;Manual techniques;Manual lymph drainage;Scar mobilization;Passive range of motion;Taping;Compression bandaging   PT Next Visit Plan  Review HEP program and make adjustments.  Assess goals for dischatge Focus on scar tissue self massage  and reducing adherence.   Consulted and Agree with Plan of Care Patient      Patient will benefit from skilled therapeutic intervention in order to improve the following deficits and impairments:  Pain, Increased fascial restricitons, Decreased range of motion, Increased edema  Visit Diagnosis: Stiffness of left shoulder, not elsewhere classified  Disorder of the skin and subcutaneous tissue related to radiation, unspecified  Other symptoms and signs involving the musculoskeletal system  Postmastectomy lymphedema  Left shoulder pain, unspecified chronicity     Problem List Patient Active Problem List   Diagnosis Date Noted  . Metastatic breast cancer ([place]) 09/24/2015  . 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 Personal history of breast cancer 08/23/2015  . 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 Degenerative arthritis of right shoulder region 01/13/2015  . Cancer of left female breast  ([place]) 07/27/2014  . 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 Rotator cuff tear 06/10/2014  . Allergic rhinitis, seasonal 09/16/2013  . Depression 08/10/2013  . Diabetes mellitus type 2, uncomplicated ([place]) 21/11/7354  . Hyperlipidemia, unspecified 08/10/2013  . BP (high blood pressure) 08/10/2013  . Osteoporosis, post-menopausal 08/10/2013   Donato Heinz. Owens Shark PT  Norwood Levo 05/16/2016, 5:39 PM  [place], [place], [address] Phone: [phone]   Fax:  [phone]  Name:  Amanda [name] MRN: [id number] Date of Birth: [birth year]

## 2016-05-17 ENCOUNTER — Telehealth: Payer: Self-pay

## 2016-05-17 ENCOUNTER — Other Ambulatory Visit: Payer: Self-pay

## 2016-05-17 DIAGNOSIS — C50912 Malignant neoplasm of unspecified site of left female breast: Secondary | ICD-10-CM

## 2016-05-17 DIAGNOSIS — C50919 Malignant neoplasm of unspecified site of unspecified female breast: Secondary | ICD-10-CM

## 2016-05-17 DIAGNOSIS — Z170 Estrogen receptor positive status [ER+]: Principal | ICD-10-CM

## 2016-05-17 NOTE — Telephone Encounter (Signed)
-----   Message from Lloyd Huger, MD sent at 05/17/2016  1:03 PM EDT ----- Regarding: RE: order for aquatic therapy  Thank you and not a problem!  We'll send the referral this week.  -Tim   ----- Message ----- From: Kipp Laurence, PT Sent: 05/16/2016   5:11 PM To: Lloyd Huger, MD Subject: order for aquatic therapy                      Hi Dr. Grayland Ormond, I have had the pleasure of treating Andersen Mckiver for lymphedema of her left upper quadrant throught referral of Dr. Iran Planas.  We are nearing the end of our sessions here and have  addressed these concerns as best we could. Ms Mcmurry will be managing with self manual lymph drainage, scar massage,  compression bra, sleeve and arm exercise. She would like to address her CIPN pain and generalized weakness that sometimes affects her balance. She really wants to do Aquatic Therapy and they have a program at [facility].  I understand they  have had some success with neuropathy patients. Ms. Brogden would be a great candidate for this program.  If you agree, would you please send a referral to [place]. For aquatic therapy program for her? Thank you so much! Maudry Diego, PT

## 2016-05-17 NOTE — Telephone Encounter (Signed)
Referral placed for Ambulatory referral to PT for Aquatics program

## 2016-05-18 ENCOUNTER — Ambulatory Visit: Payer: 59 | Admitting: Physical Therapy

## 2016-05-18 DIAGNOSIS — I972 Postmastectomy lymphedema syndrome: Secondary | ICD-10-CM

## 2016-05-18 DIAGNOSIS — M25612 Stiffness of left shoulder, not elsewhere classified: Secondary | ICD-10-CM

## 2016-05-18 DIAGNOSIS — M25512 Pain in left shoulder: Secondary | ICD-10-CM

## 2016-05-18 DIAGNOSIS — R29898 Other symptoms and signs involving the musculoskeletal system: Secondary | ICD-10-CM

## 2016-05-18 DIAGNOSIS — L599 Disorder of the skin and subcutaneous tissue related to radiation, unspecified: Secondary | ICD-10-CM

## 2016-05-18 NOTE — Therapy (Signed)
[place], [place], [address] Phone: [phone]   Fax:  [phone]  Physical Therapy Treatment  Patient Details  Name: Amanda [name] MRN: [id number] Date of Birth: 07/08/1950 Referring Provider: Dr. Irene Limbo (saw her about pain left chest)  Encounter Date: 05/18/2016      PT End of Session - 05/18/16 1513    Visit Number 10   Number of Visits 10   Date for PT Re-Evaluation 05/22/16   PT Start Time 1145   PT Stop Time 1230   PT Time Calculation (min) 45 min   Activity Tolerance Patient tolerated treatment well   Behavior During Therapy [facility] for tasks assessed/performed      Past Medical History:  Diagnosis Date  . Back pain    occasionally  . Breast cancer ([place])    left  . Depression    takes Paxil and Wellbutrin daily  . Diabetes ([place])    takes Metformin daily  . GERD (gastroesophageal reflux disease)   . History of bronchitis 2 yrs ago  . Hyperlipidemia    takes Atorvastatin daily  . Hypertension    takes Losartan-HCTZ daily  . Insomnia    takes Ambien nightly  . Insomnia    takes gabapentin nightly  . Joint pain   . Migraine   . Mood swings ([place])   . Osteoarthritis of knee   . PONV (postoperative nausea and vomiting)   . Seasonal allergies    takes Allegra daily    Past Surgical History:  Procedure Laterality Date  . BREAST BIOPSY  2009  . cataract surgery Bilateral   . COLONOSCOPY    . JOINT REPLACEMENT Left 2014   knee  . KNEE ARTHROSCOPY Left    x 5  . MASTECTOMY Left   . port a cath placed    . PORTACATH PLACEMENT N/A 09/17/2015   Procedure: INSERTION PORT-A-CATH;  Surgeon: Jules Husbands, MD;  Location: [facility] OR[facility];  Service: General;  Laterality: N/A;  . TOTAL SHOULDER ARTHROPLASTY Right 06/03/2015   Procedure: TOTAL SHOULDER ARTHROPLASTY;  Surgeon: Tania Ade, MD;  Location: [place];  Service: Orthopedics;  Laterality: Right;  Right total shoulder  arthroplasty  . TOTAL SHOULDER REPLACEMENT Right 06/03/2015  . TUBAL LIGATION      There were no vitals filed for this visit.      Subjective Assessment - 05/18/16 1200    Subjective Pt comes in with WearEase bra and has ordered more.  She also has another type of bra that fit too snugly around the chest. She will return that one.  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  Then adjuvant radiation.  Was doing fine and when I saw my oncologist for my checkup, the doctor sent her for PT for lymphedema.  Did okay but didn't keep up with self-care for that. July 2017 noted an enlarged left neck lymph node. Had multiple scans and US guided biopsy and found metastatic disease. Metastasis is in L4.  Also had some in chest wall.  She denies other bony metastases, including the left shoulder. Was in a research study and had palliative radiation to her low back.  Had 3 month CAT scan on Monday, and the areas of concern have shrunk.  Has foot neuropathy.  Diabetes; HTN; h/o  right total shoulder replacement, left knee replacement.   Patient Stated Goals To see what's going on and to find out what I need to do. I don't want to wear the compression sleeve the rest of my life.   Currently in Pain? No/denies                         [facility] Adult PT Treatment/Exercise - 05/18/16 0001      Self-Care   Self-Care Other Self-Care Comments   Other Self-Care Comments  pt independent in putting on compression bras with lymph pad over scar  pt able to verbalize and demonstrate all home exercise program, self manual lymph drainage and scar massage                 PT Education - 05/18/16 1512    Education provided Yes   Education Details Reviewd home exercise program and lymphedema  management program    Person(s) Educated Patient   Methods Explanation;Demonstration   Comprehension Verbalized understanding;Returned demonstration                Long Term Clinic Goals - 05/18/16 1215      CC Long Term Goal  #1   Title Pt. will report at least 60% decrease in left upper trap/shoulder area pain.   Baseline Pt states she has decreased by at least 60 % She still has pain with some activity    Status Achieved     CC Long Term Goal  #2   Title Patient will be knowledgeable about self-care techniques and tools available for managing her mild left UE lymphedema.   Status Achieved     CC Long Term Goal  #3   Title left shoulder active flexion at least 150 degrees   Baseline 153 on  05/09/2016   Status Achieved     CC Long Term Goal  #4   Title left shoulder active abduction at least 155 degrees   Baseline 155 on 05/09/2016   Status Achieved            Plan - 05/18/16 1513    Clinical Impression Statement Ms Jarnagin has completed her PT sessions with [place] for management of lymphedema of left arm and chest.  She knows how to perform self manual lymph draiange, exercise and has compression garments. She does still have adherent scar tissue at chest with fullness above the scar that we were not able to change much.  She is wondering if Dr. Iran Planas can do anything more about it and will follow up with her as needed.  Pt plans to continue PT for CIPN aquatic therapy  she feels that she now knows how to manage her arm and trunk lymphedema.    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    PT Frequency 2x / week   PT Duration 4 weeks   PT Treatment/Interventions ADLs/Self Care Home Management;Electrical Stimulation;DME Instruction;Therapeutic exercise;Patient/family education;Manual techniques;Manual lymph drainage;Scar mobilization;Passive range of  motion;Taping;Compression bandaging   PT Next Visit Plan Discharge this episode.      Patient will benefit from skilled therapeutic intervention in order to improve the following deficits and impairments:  Pain, Increased fascial restricitons, Decreased range of motion, Increased edema  Visit Diagnosis: Stiffness of left shoulder, not elsewhere classified  Disorder of the skin and subcutaneous tissue related to radiation, unspecified  Other symptoms  and signs involving the musculoskeletal system  Postmastectomy lymphedema  Left shoulder pain, unspecified chronicity       G-Codes - Jun 16, 2016 April 12, 1217    Functional Assessment Tool Used (Outpatient Only) clinical judgement    Functional Limitation Self care   Self Care Goal Status ([id number]) At least 1 percent but less than 20 percent impaired, limited or restricted   Self Care Discharge Status [phone]) At least 1 percent but less than 20 percent impaired, limited or restricted      Problem List Patient Active Problem List   Diagnosis Date Noted  . Metastatic breast cancer ([place]) 09/24/2015  . 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 Personal history of breast cancer 08/23/2015  . 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 Degenerative arthritis of right shoulder region 01/13/2015  . Cancer of left female breast  ([place]) 07/27/2014  . 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 Rotator cuff tear 06/10/2014  . Allergic rhinitis, seasonal 09/16/2013  . Depression 08/10/2013  . Diabetes mellitus type 2, uncomplicated ([place]) 58/94/8347  . Hyperlipidemia, unspecified 08/10/2013  . BP (high blood pressure) 08/10/2013  . Osteoporosis, post-menopausal 08/10/2013   PHYSICAL THERAPY DISCHARGE SUMMARY  Visits from Start of Care: 10  Current functional level related to goals / functional outcomes: As above    Remaining deficits: As above    Education / Equipment: As above    Plan: Patient agrees to discharge.  Patient goals were not met. Patient is being discharged due to meeting the stated rehab goals.  ?????    Donato Heinz. Owens Shark PT  Norwood Levo 2016-06-16, 5:36 PM  [place], [place], [address] Phone: [phone]   Fax:  [phone]  Name: Amanda [name] MRN: [id number] Date of Birth: 09-07-1950

## 2016-06-19 ENCOUNTER — Ambulatory Visit: Payer: 59 | Attending: Oncology | Admitting: Physical Therapy

## 2016-06-19 ENCOUNTER — Encounter: Payer: Self-pay | Admitting: Physical Therapy

## 2016-06-19 DIAGNOSIS — L599 Disorder of the skin and subcutaneous tissue related to radiation, unspecified: Secondary | ICD-10-CM | POA: Diagnosis present

## 2016-06-19 DIAGNOSIS — M25512 Pain in left shoulder: Secondary | ICD-10-CM | POA: Insufficient documentation

## 2016-06-19 DIAGNOSIS — I972 Postmastectomy lymphedema syndrome: Secondary | ICD-10-CM | POA: Insufficient documentation

## 2016-06-19 DIAGNOSIS — M25612 Stiffness of left shoulder, not elsewhere classified: Secondary | ICD-10-CM | POA: Diagnosis not present

## 2016-06-19 DIAGNOSIS — R29898 Other symptoms and signs involving the musculoskeletal system: Secondary | ICD-10-CM | POA: Insufficient documentation

## 2016-06-19 NOTE — Therapy (Signed)
[place] MAIN [facility] SERVICES [address] [place], [place], [address] Phone: [phone]   Fax:  [phone]  Physical Therapy Evaluation  Patient Details  Name: Amanda [name] MRN: [id number] Date of Birth: Apr 18, 1950 Referring Provider: Lloyd Huger  Encounter Date: 06/19/2016      PT End of Session - 06/19/16 1017    Visit Number 1   Number of Visits 17   Date for PT Re-Evaluation 08/14/16   PT Start Time 1000   PT Stop Time 1100   PT Time Calculation (min) 60 min   Activity Tolerance Patient tolerated treatment well   Behavior During Therapy [facility] for tasks assessed/performed      Past Medical History:  Diagnosis Date  . Back pain    occasionally  . Breast cancer ([place])    left  . Depression    takes Paxil and Wellbutrin daily  . Diabetes ([place])    takes Metformin daily  . GERD (gastroesophageal reflux disease)   . History of bronchitis 2 yrs ago  . Hyperlipidemia    takes Atorvastatin daily  . Hypertension    takes Losartan-HCTZ daily  . Insomnia    takes Ambien nightly  . Insomnia    takes gabapentin nightly  . Joint pain   . Migraine   . Mood swings ([place])   . Osteoarthritis of knee   . PONV (postoperative nausea and vomiting)   . Seasonal allergies    takes Allegra daily    Past Surgical History:  Procedure Laterality Date  . BREAST BIOPSY  2009  . cataract surgery Bilateral   . COLONOSCOPY    . JOINT REPLACEMENT Left 2014   knee  . KNEE ARTHROSCOPY Left    x 5  . MASTECTOMY Left   . port a cath placed    . PORTACATH PLACEMENT N/A 09/17/2015   Procedure: INSERTION PORT-A-CATH;  Surgeon: Jules Husbands, MD;  Location: [facility];  Service: General;  Laterality: N/A;  . TOTAL SHOULDER ARTHROPLASTY Right 06/03/2015   Procedure: TOTAL SHOULDER ARTHROPLASTY;  Surgeon: Tania Ade, MD;  Location: [facility];  Service: Orthopedics;  Laterality: Right;  Right total shoulder arthroplasty  . TOTAL SHOULDER  REPLACEMENT Right 06/03/2015  . TUBAL LIGATION      There were no vitals filed for this visit.       Subjective Assessment - 06/19/16 1009    Subjective Patient is having difficuty with movement wiht left UE.    Patient Stated Goals She wants to be able to be independent with aquatic program for strengthening.   Currently in Pain? Yes   Pain Score 4    Pain Location Shoulder   Pain Orientation Left   Pain Descriptors / Indicators Aching   Pain Type Chronic pain   Pain Onset More than a month ago   Pain Frequency Constant   Aggravating Factors  If she pushes her left arm with too much exercise   Multiple Pain Sites No            [facility] PT Assessment - 06/19/16 0001      Assessment   Medical Diagnosis Malignant neoplasm of left breast in female, estrogen receptor positive, unspecified site of breast    Referring Provider Delight Hoh J   Onset Date/Surgical Date 05/17/16   Hand Dominance Right   Prior Therapy out patient [place]     Precautions   Precautions Fall     Restrictions   Weight Bearing  Restrictions No     Balance Screen   Has the patient fallen in the past 6 months Yes   Has the patient had a decrease in activity level because of a fear of falling?  Yes   Is the patient reluctant to leave their home because of a fear of falling?  No     Home Environment   Living Environment Private residence   Living Arrangements Spouse/significant other   Available Help at Discharge Family   Type of [place] Access Level entry   [place] Two level   Alternate Level Stairs-Number of Steps 3   Alternate [place] - 2 wheels;Cane - single point;Shower seat;Toilet riser     Prior Function   Level of Independence Independent with basic ADLs;Independent;Independent with community mobility without device   Vocation Retired   Leisure garden, adult Publishing copy, crafts, vacation bible school     Cognition   Overall  Cognitive Status Within Functional Limits for tasks assessed       POSTURE:Rounded shoulders   PROM/AROM: left shoulder abd 110, flex 125 deg    STRENGTH:  Graded on a 0-5 scale Muscle Group Left Right  Shoulder flex -4/5 4/5  Shoulder Abd -4/5 4/5  Shoulder Ext 4/5 4/5  Shoulder IR/ER 4/5 4/5  Elbow 5/5 5/5  Wrist/hand 5/5 5/5                                       SENSATION:  BLE numbness feet  FUNCTIONAL MOBILITY:  Slow movements with bed mobility    BALANCE:  WFL with 30 tandem stand and 10 second single leg stand bilaterally   GAIT: Patient is independent with ambulation with no deficits  OUTCOME MEASURES: TEST Outcome Interpretation  Quick dash  13.63 % 0= no disabiity                             Objective measurements completed on examination: See above findings.                  PT Education - 06/19/16 1017    Education provided Yes   Education Details plan of care   Person(s) Educated Patient   Methods Explanation   Comprehension Verbalized understanding             PT Long Term Goals - 06/19/16 1034      PT LONG TERM GOAL #1   Title Patient will be independent in home exercise program to improve strength/mobility for better functional independence with ADLs.   Time 8   Period Weeks   Status New     PT LONG TERM GOAL #2   Title Patient will increase BLE gross strength to 4+/5 as to improve functional strength for independent gait, increased standing tolerance and increased ADL ability.   Baseline -4/5 left shoulder abd and left shoulder flex   Time 8   Period Weeks   Status New     PT LONG TERM GOAL #3   Title Patient will report a worst pain of 2/10 on VAS in  left shouder           to improve tolerance with ADLs and reduced symptoms with activities.    Baseline 4/10 left shoulder   Time 8   Period Weeks  Status New     PT LONG TERM GOAL #4   Title Patient will decrease Quick DASH score by > 8 points  demonstrating reduced self-reported upper extremity disability.   Baseline 13.63 % quick dahs   Time 8   Period Weeks   Status New                        Plan - 06/19/16 1026    Clinical Impression Statement Patient presents with decreased left shoulder ROM , weakness BUE shoulders and pain in left shoulder that is 4/10. She would benefit from being  instructed in aquatic therapy for strengthening of BUE and decreasing her left shoulder pain and improving her quick dash score to indicate functional gains.    History and Personal Factors relevant to plan of care: This patient presents with 2, personal factors/ comorbidities hx of a fall and current situation, and 3  body elements including body structures and functions, activity limitations and or participation restrictions including numbness in BLE feet, weakness in BUE shoulder and decreased AROM LUE shoulder. Patient's condition is  Evolving.   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    PT Frequency 2x / week   PT Duration 8 weeks   PT Treatment/Interventions ADLs/Self Care Home Management;Aquatic Therapy;Functional mobility training;Therapeutic exercise;Therapeutic activities;Patient/family education   Consulted and Agree with Plan of Care Patient      Patient will benefit from skilled therapeutic intervention in order to improve the following deficits and impairments:  Pain, Increased fascial restricitons, Decreased range of motion, Increased edema, Impaired flexibility, Decreased strength, Decreased activity tolerance  Visit Diagnosis: Stiffness of left shoulder, not elsewhere classified  Left shoulder pain, unspecified chronicity     Problem List Patient Active Problem List   Diagnosis Date Noted  . Metastatic breast cancer  ([place]) 09/24/2015  . 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 Personal history of breast cancer 08/23/2015  . 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 Degenerative arthritis of right shoulder region 01/13/2015  . Cancer of left female breast  ([place]) 07/27/2014  . 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 Rotator cuff tear 06/10/2014  . Allergic rhinitis, seasonal 09/16/2013  . Depression 08/10/2013  . Diabetes mellitus type 2, uncomplicated ([place]) 74/16/3845  . Hyperlipidemia, unspecified 08/10/2013  . BP (high blood pressure) 08/10/2013  . Osteoporosis, post-menopausal 08/10/2013   Alanson Puls, PT, DPT [place], [place]ette [place] S 06/19/2016, 10:37 AM  [place] MAIN [facility] SERVICES [address] [place], [place], [address] Phone: [phone]   Fax:  [phone]  Name: PUNEET SELDEN MRN: [id number] Date of Birth: 04-18-1950

## 2016-06-26 ENCOUNTER — Inpatient Hospital Stay: Payer: 59 | Attending: Oncology

## 2016-06-26 ENCOUNTER — Inpatient Hospital Stay: Payer: 59

## 2016-06-26 ENCOUNTER — Ambulatory Visit
Admission: RE | Admit: 2016-06-26 | Discharge: 2016-06-26 | Disposition: A | Discharge status: Home / Self Care | Payer: 59 | Source: Ambulatory Visit | Attending: Oncology | Admitting: Oncology

## 2016-06-26 DIAGNOSIS — C7951 Secondary malignant neoplasm of bone: Secondary | ICD-10-CM | POA: Insufficient documentation

## 2016-06-26 DIAGNOSIS — C50912 Malignant neoplasm of unspecified site of left female breast: Secondary | ICD-10-CM | POA: Insufficient documentation

## 2016-06-26 DIAGNOSIS — Z9012 Acquired absence of left breast and nipple: Secondary | ICD-10-CM | POA: Insufficient documentation

## 2016-06-26 DIAGNOSIS — E119 Type 2 diabetes mellitus without complications: Secondary | ICD-10-CM | POA: Diagnosis not present

## 2016-06-26 DIAGNOSIS — K219 Gastro-esophageal reflux disease without esophagitis: Secondary | ICD-10-CM | POA: Diagnosis not present

## 2016-06-26 DIAGNOSIS — I700 Atherosclerosis of aorta: Secondary | ICD-10-CM | POA: Insufficient documentation

## 2016-06-26 DIAGNOSIS — M549 Dorsalgia, unspecified: Secondary | ICD-10-CM | POA: Diagnosis not present

## 2016-06-26 DIAGNOSIS — Z7984 Long term (current) use of oral hypoglycemic drugs: Secondary | ICD-10-CM | POA: Insufficient documentation

## 2016-06-26 DIAGNOSIS — M8580 Other specified disorders of bone density and structure, unspecified site: Secondary | ICD-10-CM | POA: Insufficient documentation

## 2016-06-26 DIAGNOSIS — M2550 Pain in unspecified joint: Secondary | ICD-10-CM | POA: Insufficient documentation

## 2016-06-26 DIAGNOSIS — G4700 Insomnia, unspecified: Secondary | ICD-10-CM | POA: Diagnosis not present

## 2016-06-26 DIAGNOSIS — Z803 Family history of malignant neoplasm of breast: Secondary | ICD-10-CM | POA: Insufficient documentation

## 2016-06-26 DIAGNOSIS — Z170 Estrogen receptor positive status [ER+]: Secondary | ICD-10-CM | POA: Insufficient documentation

## 2016-06-26 DIAGNOSIS — I10 Essential (primary) hypertension: Secondary | ICD-10-CM | POA: Diagnosis not present

## 2016-06-26 DIAGNOSIS — F329 Major depressive disorder, single episode, unspecified: Secondary | ICD-10-CM | POA: Diagnosis not present

## 2016-06-26 DIAGNOSIS — Z7982 Long term (current) use of aspirin: Secondary | ICD-10-CM | POA: Insufficient documentation

## 2016-06-26 DIAGNOSIS — R918 Other nonspecific abnormal finding of lung field: Secondary | ICD-10-CM | POA: Diagnosis not present

## 2016-06-26 DIAGNOSIS — Z95828 Presence of other vascular implants and grafts: Secondary | ICD-10-CM

## 2016-06-26 DIAGNOSIS — Z79899 Other long term (current) drug therapy: Secondary | ICD-10-CM | POA: Diagnosis not present

## 2016-06-26 LAB — CBC WITH DIFFERENTIAL/PLATELET
BASOS ABS: 0.1 10*3/uL (ref 0–0.1)
BASOS PCT: 1 %
Eosinophils Absolute: 0.3 10*3/uL (ref 0–0.7)
Eosinophils Relative: 7 %
HEMATOCRIT: 38.7 % (ref 35.0–47.0)
HEMOGLOBIN: 13.4 g/dL (ref 12.0–16.0)
Lymphocytes Relative: 27 %
Lymphs Abs: 1.3 10*3/uL (ref 1.0–3.6)
MCH: 31.2 pg (ref 26.0–34.0)
MCHC: 34.6 g/dL (ref 32.0–36.0)
MCV: 90 fL (ref 80.0–100.0)
MONOS PCT: 12 %
Monocytes Absolute: 0.6 10*3/uL (ref 0.2–0.9)
NEUTROS PCT: 53 %
Neutro Abs: 2.6 10*3/uL (ref 1.4–6.5)
Platelets: 308 10*3/uL (ref 150–440)
RBC: 4.3 MIL/uL (ref 3.80–5.20)
RDW: 14.5 % (ref 11.5–14.5)
WBC: 4.9 10*3/uL (ref 3.6–11.0)

## 2016-06-26 LAB — COMPREHENSIVE METABOLIC PANEL
ALBUMIN: 4.2 g/dL (ref 3.5–5.0)
ALK PHOS: 65 U/L (ref 38–126)
ALT: 35 U/L (ref 14–54)
AST: 47 U/L — AB (ref 15–41)
Anion gap: 7 (ref 5–15)
BILIRUBIN TOTAL: 0.6 mg/dL (ref 0.3–1.2)
BUN: 24 mg/dL — AB (ref 6–20)
CALCIUM: 9.4 mg/dL (ref 8.9–10.3)
CO2: 27 mmol/L (ref 22–32)
Chloride: 104 mmol/L (ref 101–111)
Creatinine, Ser: 1.04 mg/dL — ABNORMAL HIGH (ref 0.44–1.00)
GFR calc Af Amer: >60 mL/min (ref >60)
GFR calc non Af Amer: 56 mL/min — ABNORMAL LOW (ref >60)
GLUCOSE: 162 mg/dL — AB (ref 65–99)
Potassium: 3.9 mmol/L (ref 3.5–5.1)
Sodium: 138 mmol/L (ref 135–145)
TOTAL PROTEIN: 7.2 g/dL (ref 6.5–8.1)

## 2016-06-26 MED ORDER — HEPARIN SOD (PORK) LOCK FLUSH 100 UNIT/ML IV SOLN: 500.0000 [IU] | Freq: Once | INTRAVENOUS | Status: DC

## 2016-06-26 MED ORDER — SODIUM CHLORIDE 0.9% FLUSH
10.0000 mL | INTRAVENOUS | Status: DC | PRN
  Administered 2016-06-26: 10 mL via INTRAVENOUS
  Filled 2016-06-26: qty 10

## 2016-06-26 MED ORDER — IOPAMIDOL (ISOVUE-300) INJECTION 61%
100.0000 mL | Freq: Once | INTRAVENOUS | Status: AC | PRN
  Administered 2016-06-26: 100 mL via INTRAVENOUS

## 2016-06-26 MED ORDER — HEPARIN SOD (PORK) LOCK FLUSH 100 UNIT/ML IV SOLN
500.0000 [IU] | Freq: Once | INTRAVENOUS | Status: AC
  Administered 2016-06-26: 500 [IU] via INTRAVENOUS

## 2016-06-27 ENCOUNTER — Ambulatory Visit: Payer: 59 | Admitting: Physical Therapy

## 2016-06-27 DIAGNOSIS — L599 Disorder of the skin and subcutaneous tissue related to radiation, unspecified: Secondary | ICD-10-CM

## 2016-06-27 DIAGNOSIS — I972 Postmastectomy lymphedema syndrome: Secondary | ICD-10-CM

## 2016-06-27 DIAGNOSIS — M25612 Stiffness of left shoulder, not elsewhere classified: Secondary | ICD-10-CM | POA: Diagnosis not present

## 2016-06-27 DIAGNOSIS — M25512 Pain in left shoulder: Secondary | ICD-10-CM

## 2016-06-27 DIAGNOSIS — R29898 Other symptoms and signs involving the musculoskeletal system: Secondary | ICD-10-CM

## 2016-06-27 LAB — CANCER ANTIGEN 27.29: CA 27.29: 33 U/mL (ref 0.0–38.6)

## 2016-06-27 NOTE — Therapy (Signed)
[place] MAIN [facility] SERVICES [address] [place], [place], [address] Phone: [phone]   Fax:  [phone]  Physical Therapy Treatment  Patient Details  Name: Amanda [name] MRN: [id number] Date of Birth: September 03, 1950 Referring Provider: Lloyd Huger  Encounter Date: 06/27/2016      PT End of Session - 06/27/16 1107    Visit Number 2   Number of Visits 17   Date for PT Re-Evaluation 08/14/16   PT Start Time 0910   PT Stop Time 0945   PT Time Calculation (min) 35 min   Activity Tolerance Patient tolerated treatment well   Behavior During Therapy [facility] for tasks assessed/performed      Past Medical History:  Diagnosis Date  . Back pain    occasionally  . Breast cancer ([place])    left  . Depression    takes Paxil and Wellbutrin daily  . Diabetes ([place])    takes Metformin daily  . GERD (gastroesophageal reflux disease)   . History of bronchitis 2 yrs ago  . Hyperlipidemia    takes Atorvastatin daily  . Hypertension    takes Losartan-HCTZ daily  . Insomnia    takes Ambien nightly  . Insomnia    takes gabapentin nightly  . Joint pain   . Migraine   . Mood swings ([place])   . Osteoarthritis of knee   . PONV (postoperative nausea and vomiting)   . Seasonal allergies    takes Allegra daily    Past Surgical History:  Procedure Laterality Date  . BREAST BIOPSY  2009  . cataract surgery Bilateral   . COLONOSCOPY    . JOINT REPLACEMENT Left 2014   knee  . KNEE ARTHROSCOPY Left    x 5  . MASTECTOMY Left   . port a cath placed    . PORTACATH PLACEMENT N/A 09/17/2015   Procedure: INSERTION PORT-A-CATH;  Surgeon: Jules Husbands, MD;  Location: [facility];  Service: General;  Laterality: N/A;  . TOTAL SHOULDER ARTHROPLASTY Right 06/03/2015   Procedure: TOTAL SHOULDER ARTHROPLASTY;  Surgeon: Tania Ade, MD;  Location: [facility];  Service: Orthopedics;  Laterality: Right;  Right total shoulder arthroplasty  . TOTAL SHOULDER  REPLACEMENT Right 06/03/2015  . TUBAL LIGATION      There were no vitals filed for this visit.      Subjective Assessment - 06/27/16 1100    Subjective Pt reports there is an annoying ache in her L shoulder. Occurs with lifting. reaching overhead. Pt states when she had PT, they have not differentiated between lymphedema or shoulder issue.    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  Then adjuvant radiation.  Was doing fine and when I saw my oncologist for my checkup, the doctor sent her for PT for lymphedema.  Did okay but didn't keep up with self-care for that. July 2017 noted an enlarged left neck lymph node. Had multiple scans and US guided biopsy and found metastatic disease. Metastasis is in L4.  Also had some in chest wall.  She denies other bony metastases, including the left shoulder. Was in a research study and had palliative radiation to her low back.  Had 3 month CAT scan on Monday, and the areas of concern have shrunk.  Has foot neuropathy.  Diabetes; HTN; h/o right total shoulder replacement, left knee replacement.   Patient Stated Goals She wants to be able  to be independent with aquatic program for strengthening.   Pain Onset More than a month ago                     Adult Aquatic Therapy - 06/27/16 1103      Aquatic Therapy Subjective   Subjective pt had no complaints . She reported noticing her balance was off with backward walking due to neuropathy but she paid attention to the blue line on poool floor        O: Pt entered/exited the pool via steps with single UE support on rail.  50 ft =1 lap  Exercises performed in 3'6" depth     Stretches: Seated on bench  Elbow circles backwards 10 reps B Self hug and shoulder add 5 reps  Levator / upper trap stretch 5 reps B  Trunk rotation 5 x B  Chest rows w/ noodle 10 reps Isometric holds  for scapula retractions 5 sec x 5    Noodle behind waist for shoulder depression and retraction 2 laps forward walking , propioception training over blue line pool floor  2 lapse back ward walking with noodle in L hand, pushed down at thigh height  for depression    stretches BUE on rail with cue for scapular retraction pififormis stretch  B 5 breaths   Elbow circles backwards 10 reps B Self hug and shoulder add 5 reps  Self pat on back 5 reps   Rest break seated on noodles BUE on rail 5'              PT Long Term Goals - 06/19/16 1034      PT LONG TERM GOAL #1   Title Patient will be independent in home exercise program to improve strength/mobility for better functional independence with ADLs.   Time 8   Period Weeks   Status New     PT LONG TERM GOAL #2   Title Patient will increase BLE gross strength to 4+/5 as to improve functional strength for independent gait, increased standing tolerance and increased ADL ability.   Baseline -4/5 left shoulder abd and left shoulder flex   Time 8   Period Weeks   Status New     PT LONG TERM GOAL #3   Title Patient will report a worst pain of 2/10 on VAS in  left shouder           to improve tolerance with ADLs and reduced symptoms with activities.    Baseline 4/10 left shoulder   Time 8   Period Weeks   Status New     PT LONG TERM GOAL #4   Title Patient will decrease Quick DASH score by > 8 points demonstrating reduced self-reported upper extremity disability.   Baseline 13.63 % quick dahs   Time 8   Period Weeks   Status New           Long Term Clinic Goals - 05/18/16 1215      CC Long Term Goal  #1   Title Pt. will report at least 60% decrease in left upper trap/shoulder area pain.   Baseline Pt states she has decreased by at least 60 % She still has pain with some activity    Status Achieved     CC Long Term Goal  #2   Title Patient will be knowledgeable about self-care techniques and tools available for  managing her mild left UE lymphedema.   Status Achieved  CC Long Term Goal  #3   Title left shoulder active flexion at least 150 degrees   Baseline 153 on  05/09/2016   Status Achieved     CC Long Term Goal  #4   Title left shoulder active abduction at least 155 degrees   Baseline 155 on 05/09/2016   Status Achieved            Plan - 06/27/16 1107    Clinical Impression Statement Pt demo'd less pain with scapular stabilization assessment . Noted scapular dyskinesis of L scapula and therefore , focused on scapular stabilization exercises today in the pool. Pt tolerated well without complaints. Pt continues to benefit from skilled PT.  Discussed HEP to maintain on land.     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    PT Frequency 2x / week   PT Duration 8 weeks   PT Treatment/Interventions ADLs/Self Care Home Management;Aquatic Therapy;Functional mobility training;Therapeutic exercise;Therapeutic activities;Patient/family education   Consulted and Agree with Plan of Care Patient      Patient will benefit from skilled therapeutic intervention in order to improve the following deficits and impairments:  Pain, Increased fascial restricitons, Decreased range of motion, Increased edema, Impaired flexibility, Decreased strength, Decreased activity tolerance  Visit Diagnosis: Stiffness of left shoulder, not elsewhere classified  Left shoulder pain, unspecified chronicity  Disorder of the skin and subcutaneous tissue related to radiation, unspecified  Other symptoms and signs involving the musculoskeletal system  Postmastectomy lymphedema     Problem List Patient Active Problem List   Diagnosis Date Noted  . Metastatic breast cancer ([place]) 09/24/2015  . 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 Personal history of breast cancer 08/23/2015  .  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 Degenerative arthritis of right shoulder region 01/13/2015  . Cancer of left female breast  ([place]) 07/27/2014  . 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 Rotator cuff tear 06/10/2014  . Allergic rhinitis, seasonal 09/16/2013  . Depression 08/10/2013  . Diabetes mellitus type 2, uncomplicated ([place]) 00/76/2263  . Hyperlipidemia, unspecified 08/10/2013  . BP (high blood pressure) 08/10/2013  . Osteoporosis, post-menopausal 08/10/2013    Jerl Mina ,PT, DPT, E-RYT  06/27/2016, 11:10 AM  [place] MAIN [facility] SERVICES [address] [place], [place], [address] Phone: [phone]   Fax:  [phone]  Name: Amanda [name] MRN: [id number] Date of Birth: 1950-07-10

## 2016-06-27 NOTE — Progress Notes (Signed)
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  INTERVAL HISTORY: Patient returns to clinic today for further evaluation and discussion of her imaging results. She continues to have neuropathy in her fingertips and feet which is unchanged. She otherwise feels well and is asymptomatic. She has no other neurologic complaints. She denies any recent fevers or illnesses.  She denies any chest pain or shortness of breath.  She denies any nausea, vomiting, constipation, or diarrhea.  She has no urinary complaints.  Patient offers no specific complaints today.  REVIEW OF SYSTEMS:   Review of Systems  Constitutional: Negative.  Negative for fever, malaise/fatigue and weight loss.  Eyes: Negative.  Negative for blurred vision.  Respiratory: Negative.  Negative for cough and shortness of breath.   Cardiovascular: Negative.  Negative for chest pain and leg swelling.  Gastrointestinal: Negative.  Negative for abdominal pain.  Genitourinary: Negative.   Musculoskeletal: Negative for back pain.  Neurological: Positive for sensory change. Negative for weakness.  Endo/Heme/Allergies: Negative.   Psychiatric/Behavioral: Negative.  Negative for memory loss. The patient is not nervous/anxious.     As per HPI. Otherwise, a complete review of systems is negative.  PAST MEDICAL HISTORY: Past Medical History:  Diagnosis Date  . Back pain    occasionally  . Breast cancer ([place])    left  . Depression    takes Paxil and Wellbutrin daily  . Diabetes ([place])    takes Metformin daily  . GERD (gastroesophageal reflux  disease)   . History of bronchitis 2 yrs ago  . Hyperlipidemia    takes Atorvastatin daily  . Hypertension    takes Losartan-HCTZ daily  . Insomnia    takes Ambien nightly  . Insomnia    takes gabapentin nightly  . Joint pain   . Migraine   . Mood swings ([place])   . Osteoarthritis of knee   . PONV (postoperative nausea and vomiting)   . Seasonal allergies    takes Allegra daily    PAST SURGICAL HISTORY: Past Surgical History:  Procedure Laterality Date  . BREAST BIOPSY  2009  . cataract surgery Bilateral   . COLONOSCOPY    . JOINT REPLACEMENT Left 2014   knee  . KNEE ARTHROSCOPY Left    x 5  . MASTECTOMY Left   . port a cath placed    . PORTACATH PLACEMENT N/A 09/17/2015   Procedure: INSERTION PORT-A-CATH;  Surgeon: Jules Husbands, MD;  Location: [facility];  Service: General;  Laterality: N/A;  . TOTAL SHOULDER ARTHROPLASTY Right 06/03/2015   Procedure: TOTAL SHOULDER ARTHROPLASTY;  Surgeon: Tania Ade, MD;  Location: [facility];  Service: Orthopedics;  Laterality: Right;  Right total shoulder arthroplasty  . TOTAL SHOULDER REPLACEMENT Right 06/03/2015  . TUBAL LIGATION      FAMILY HISTORY: Father with non-Hodgkin's lymphoma, 2 paternal aunts with breast cancer.     ADVANCED DIRECTIVES:    HEALTH MAINTENANCE: Social History  Substance Use Topics  . Smoking status: Never Smoker  . Smokeless tobacco: Never Used  . Alcohol use 0.0 oz/week     Comment: occasionally wine     Colonoscopy:  PAP:  Bone density:  Lipid  panel:  Allergies  Allergen Reactions  . Ace Inhibitors Cough  . Latex Itching  . Morphine And Related Itching    Caused her to itch terribly. Would prefer if given to take with a benadryl  . Other Itching    Freeze spray. Patient stated that she may be able to use it now because she doesn't use it as often.  Marland Kitchen Penicillins Rash    Has patient had a PCN reaction causing immediate rash, facial/tongue/throat swelling, SOB or lightheadedness with  hypotension: No Has patient had a PCN reaction causing severe rash involving mucus membranes or skin necrosis: No Has patient had a PCN reaction that required hospitalization No Has patient had a PCN reaction occurring within the last 10 years: No If all of the above answers are "NO", then may proceed with Cephalosporin use.    Current Outpatient Prescriptions  Medication Sig Dispense Refill  . aspirin EC 81 MG tablet Take 81 mg by mouth daily.     Marland Kitchen atorvastatin (LIPITOR) 10 MG tablet Take 10 mg by mouth daily at 6 PM.     . b complex vitamins capsule Take 1 capsule by mouth daily.    Marland Kitchen buPROPion (WELLBUTRIN XL) 150 MG 24 hr tablet Take 150 mg by mouth every morning.     . Calcium-Magnesium-Vitamin D (CALCIUM 1200+D3 PO) Take 1 Dose by mouth daily.    . fexofenadine (ALLEGRA) 180 MG tablet Take 180 mg by mouth at bedtime.     . gabapentin (NEURONTIN) 300 MG capsule Take 300 mg by mouth. 2 pills in am,2 pills at noon and 3 pills at night    . letrozole (FEMARA) 2.5 MG tablet Take 1 tablet (2.5 mg total) by mouth daily. 90 tablet 3  . lidocaine-prilocaine (EMLA) cream Apply 1 application topically as needed. Apply to port 1-2 hours prior to chemotherapy appointment. Cover with plastic wrap. 30 g 0  . losartan-hydrochlorothiazide (HYZAAR) 100-25 MG per tablet Take 1 tablet by mouth every morning.     . metFORMIN (GLUCOPHAGE) 850 MG tablet Take 850 mg by mouth 2 (two) times daily with a meal.     . ondansetron (ZOFRAN ODT) 4 MG disintegrating tablet Take 1 tablet (4 mg total) by mouth every 8 (eight) hours as needed for nausea or vomiting. 20 tablet 0  . oxyCODONE-acetaminophen (ROXICET) 5-325 MG tablet Take 1 tablet by mouth every 6 (six) hours as needed. 20 tablet 0  . PARoxetine (PAXIL) 40 MG tablet Take 40 mg by mouth every morning.     . Zolpidem Tartrate (AMBIEN PO) Take by mouth.     Current Facility-Administered Medications  Medication Dose Route Frequency Provider Last Rate Last Dose    . guaiFENesin-dextromethorphan (ROBITUSSIN DM) 100-10 MG/5ML syrup 5 mL  5 mL Oral Once Lloyd Huger, MD        OBJECTIVE: Vitals:   06/28/16 1044  BP: 111/75  Pulse: 81  Resp: 20  Temp: (!) 96.7 F (35.9 C)     Body mass index is 36.63 kg/m.    ECOG FS:0 - Asymptomatic  General: Well-developed, well-nourished, no acute distress. Eyes: Pink conjunctiva, anicteric sclera. HEENT: No palpable lymphadenopathy. Breast: Left chest wall without evidence of recurrence.  Right breast and axilla without lumps or masses. Exam deferred today. Lungs: Clear to auscultation bilaterally. Heart: Regular rate and rhythm. No rubs, murmurs, or gallops. Abdomen: Soft, nontender, nondistended. No organomegaly noted, normoactive bowel sounds. Musculoskeletal: No edema, cyanosis, or clubbing. Neuro: Alert, answering all questions  appropriately. Cranial nerves grossly intact. Skin: No rashes or petechiae noted. Psych: Normal affect.   LAB RESULTS:  Lab Results  Component Value Date   NA 138 06/26/2016   K 3.9 06/26/2016   CL 104 06/26/2016   CO2 27 06/26/2016   GLUCOSE 162 (H) 06/26/2016   BUN 24 (H) 06/26/2016   CREATININE 1.04 (H) 06/26/2016   CALCIUM 9.4 06/26/2016   PROT 7.2 06/26/2016   ALBUMIN 4.2 06/26/2016   AST 47 (H) 06/26/2016   ALT 35 06/26/2016   ALKPHOS 65 06/26/2016   BILITOT 0.6 06/26/2016   GFRNONAA 56 (L) 06/26/2016   GFRAA >60 06/26/2016    Lab Results  Component Value Date   WBC 4.9 06/26/2016   NEUTROABS 2.6 06/26/2016   HGB 13.4 06/26/2016   HCT 38.7 06/26/2016   MCV 90.0 06/26/2016   PLT 308 06/26/2016     STUDIES: Ct Soft Tissue Neck W Contrast  Result Date: 06/26/2016 CLINICAL DATA:  Inflammatory breast cancer. EXAM: CT NECK WITH CONTRAST TECHNIQUE: Multidetector CT imaging of the neck was performed using the standard protocol following the bolus administration of intravenous contrast. CONTRAST:  174m ISOVUE-300 IOPAMIDOL (ISOVUE-300) INJECTION  61% COMPARISON:  04/10/2016. FINDINGS: Pharynx and larynx: Normal. No mass or swelling. Salivary glands: No inflammation, mass, or stone. Thyroid: Normal. Lymph nodes: LEFT level 5 node, image 59 series 2, stable from priors. No new adenopathy. Vascular: RIGHT IJ Port-A-Cath unchanged. Limited intracranial: No acute findings. Visualized orbits:  Unremarkable.  BILATERAL cataract extraction. Mastoids and visualized paranasal sinuses: No acute findings. Skeleton: Multifocal osseous disease, sclerotic in nature primarily. No definite progression. Upper chest: Reported separately. Other: None. IMPRESSION: Stable exam.  No progression of nodal dizzy or new adenopathy. Stable visualized sclerotic osseous metastases. Electronically Signed   By: JStaci RighterM.D.   On: 06/26/2016 10:33   Ct Chest W Contrast  Result Date: 06/26/2016 CLINICAL DATA:  Patient with history of inflammatory breast cancer. Follow-up exam. EXAM: CT CHEST, ABDOMEN, AND PELVIS WITH CONTRAST TECHNIQUE: Multidetector CT imaging of the chest, abdomen and pelvis was performed following the standard protocol during bolus administration of intravenous contrast. CONTRAST:  1058mISOVUE-300 IOPAMIDOL (ISOVUE-300) INJECTION 61% COMPARISON:  CT CAP 04/10/2016. FINDINGS: CT CHEST FINDINGS Cardiovascular: Right anterior chest wall Port-A-Cath is present tip terminating in the superior vena cava. Normal heart size. Coronary arterial vascular calcifications. Aorta and main pulmonary artery are normal in caliber. Mediastinum/Nodes: No enlarged axillary, mediastinal or hilar lymphadenopathy. Small hiatal hernia. Lungs/Pleura: Central airways are patent. Stable radiation changes within the left upper lobe/ lingula. Dependent atelectasis within the bilateral lower lobes. No pleural effusion or pneumothorax. Small focal area of consolidation within the left lower lobe (image 42; series 4), favored to represent an area of atelectasis. Musculoskeletal: Postsurgical  changes proximal right humerus. Re- demonstrated postsurgical changes compatible with left mastectomy. Unchanged seroma at the surgical site. Re- demonstrated widespread sclerotic lesions, compatible with diffuse osseous metastatic disease, grossly unchanged. CT ABDOMEN PELVIS FINDINGS Hepatobiliary: The liver is normal in size and contour. No focal hepatic lesion is identified. Gallbladder is unremarkable. No intrahepatic or extrahepatic biliary ductal dilatation. Pancreas: Unremarkable Spleen: Unremarkable Adrenals/Urinary Tract: Adrenal glands are normal. Kidneys enhance symmetrically with contrast. Left sided parapelvic cyst. No hydronephrosis. Urinary bladder is unremarkable. 3 mm stone inferior pole right kidney. Stomach/Bowel: Descending colonic diverticulosis. No CT evidence for acute diverticulitis. Normal morphology of the stomach. No free fluid or free intraperitoneal air. Vascular/Lymphatic: Normal caliber abdominal aorta. Peripheral calcified atherosclerotic plaque. No  retroperitoneal lymphadenopathy. Reproductive: Uterus and adnexal structures are unremarkable. Other: None. Musculoskeletal: Unchanged extensive sclerotic metastasis throughout the visualized skeleton, compatible with widespread osseous metastatic disease. IMPRESSION: Grossly unchanged extensive sclerotic metastasis throughout the axial and appendicular skeleton. Small subpleural opacity within the left lower lobe favored to represent a focal area of atelectasis. Recommend attention on follow-up. Aortic atherosclerosis. Electronically Signed   By: Lovey Newcomer M.D.   On: 06/26/2016 12:00   Ct Abdomen Pelvis W Contrast  Result Date: 06/26/2016 CLINICAL DATA:  Patient with history of inflammatory breast cancer. Follow-up exam. EXAM: CT CHEST, ABDOMEN, AND PELVIS WITH CONTRAST TECHNIQUE: Multidetector CT imaging of the chest, abdomen and pelvis was performed following the standard protocol during bolus administration of intravenous  contrast. CONTRAST:  172m ISOVUE-300 IOPAMIDOL (ISOVUE-300) INJECTION 61% COMPARISON:  CT CAP 04/10/2016. FINDINGS: CT CHEST FINDINGS Cardiovascular: Right anterior chest wall Port-A-Cath is present tip terminating in the superior vena cava. Normal heart size. Coronary arterial vascular calcifications. Aorta and main pulmonary artery are normal in caliber. Mediastinum/Nodes: No enlarged axillary, mediastinal or hilar lymphadenopathy. Small hiatal hernia. Lungs/Pleura: Central airways are patent. Stable radiation changes within the left upper lobe/ lingula. Dependent atelectasis within the bilateral lower lobes. No pleural effusion or pneumothorax. Small focal area of consolidation within the left lower lobe (image 42; series 4), favored to represent an area of atelectasis. Musculoskeletal: Postsurgical changes proximal right humerus. Re- demonstrated postsurgical changes compatible with left mastectomy. Unchanged seroma at the surgical site. Re- demonstrated widespread sclerotic lesions, compatible with diffuse osseous metastatic disease, grossly unchanged. CT ABDOMEN PELVIS FINDINGS Hepatobiliary: The liver is normal in size and contour. No focal hepatic lesion is identified. Gallbladder is unremarkable. No intrahepatic or extrahepatic biliary ductal dilatation. Pancreas: Unremarkable Spleen: Unremarkable Adrenals/Urinary Tract: Adrenal glands are normal. Kidneys enhance symmetrically with contrast. Left sided parapelvic cyst. No hydronephrosis. Urinary bladder is unremarkable. 3 mm stone inferior pole right kidney. Stomach/Bowel: Descending colonic diverticulosis. No CT evidence for acute diverticulitis. Normal morphology of the stomach. No free fluid or free intraperitoneal air. Vascular/Lymphatic: Normal caliber abdominal aorta. Peripheral calcified atherosclerotic plaque. No retroperitoneal lymphadenopathy. Reproductive: Uterus and adnexal structures are unremarkable. Other: None. Musculoskeletal: Unchanged  extensive sclerotic metastasis throughout the visualized skeleton, compatible with widespread osseous metastatic disease. IMPRESSION: Grossly unchanged extensive sclerotic metastasis throughout the axial and appendicular skeleton. Small subpleural opacity within the left lower lobe favored to represent a focal area of atelectasis. Recommend attention on follow-up. Aortic atherosclerosis. Electronically Signed   By: DLovey NewcomerM.D.   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 Now with biopsy proven stage IV disease with lymph node and bone metastasis.  PLAN:    1.  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 Continue letrozole indefinitely or until progression of disease. Because of this, she will no longer be enrolled in the clinical trial. Patient will then return to clinic in 3 months for laboratory work only and then in 6 months with repeat imaging and further evaluation. She will also require a mammogram in November 2018. Given her bony disease, can consider Zometa in the future. Patient does not wish to pursue at this time. Previously, she completed 5 years of Arimidex in June of 2015.  2.  Osteopenia: Patient's  bone mineral density on January 26, 2016 reported a T score of -0.9 which is considered normal. Repeat in January 2020. Continue calcium and vitamin D.  3. Anemia: Resolved. 4. Peripheral neuropathy: Continue gabapentin as prescribed. Neuropathy managed by neurology.  5. Back pain: Resolved.  6. Cough: Patient does not complain of this today. Continue Tussionex as needed.  Patient expressed understanding and was in agreement with this plan. She also understands that  She can call clinic at any time with any questions, concerns, or complaints.   Breast cancer   Staging form: Breast, AJCC 7th Edition     Pathologic stage from 08/11/2014: Stage IIIA (T0, N2a, cM0) - Signed by Lloyd Huger, MD on 08/11/2014   Lloyd Huger, MD   06/30/2016 2:27 PM

## 2016-06-28 ENCOUNTER — Inpatient Hospital Stay (HOSPITAL_BASED_OUTPATIENT_CLINIC_OR_DEPARTMENT_OTHER): Payer: 59 | Admitting: Oncology

## 2016-06-28 VITALS — BP 111/75 | HR 81 | Temp 96.7°F | Resp 20 | Wt 213.4 lb

## 2016-06-28 DIAGNOSIS — M2550 Pain in unspecified joint: Secondary | ICD-10-CM

## 2016-06-28 DIAGNOSIS — F329 Major depressive disorder, single episode, unspecified: Secondary | ICD-10-CM | POA: Diagnosis not present

## 2016-06-28 DIAGNOSIS — C7951 Secondary malignant neoplasm of bone: Secondary | ICD-10-CM | POA: Diagnosis not present

## 2016-06-28 DIAGNOSIS — M549 Dorsalgia, unspecified: Secondary | ICD-10-CM

## 2016-06-28 DIAGNOSIS — I10 Essential (primary) hypertension: Secondary | ICD-10-CM

## 2016-06-28 DIAGNOSIS — K219 Gastro-esophageal reflux disease without esophagitis: Secondary | ICD-10-CM

## 2016-06-28 DIAGNOSIS — Z9012 Acquired absence of left breast and nipple: Secondary | ICD-10-CM

## 2016-06-28 DIAGNOSIS — C50912 Malignant neoplasm of unspecified site of left female breast: Secondary | ICD-10-CM | POA: Diagnosis not present

## 2016-06-28 DIAGNOSIS — E119 Type 2 diabetes mellitus without complications: Secondary | ICD-10-CM | POA: Diagnosis not present

## 2016-06-28 DIAGNOSIS — Z170 Estrogen receptor positive status [ER+]: Secondary | ICD-10-CM | POA: Diagnosis not present

## 2016-06-28 DIAGNOSIS — M8580 Other specified disorders of bone density and structure, unspecified site: Secondary | ICD-10-CM | POA: Diagnosis not present

## 2016-06-28 DIAGNOSIS — Z803 Family history of malignant neoplasm of breast: Secondary | ICD-10-CM

## 2016-06-28 DIAGNOSIS — Z79899 Other long term (current) drug therapy: Secondary | ICD-10-CM

## 2016-06-28 DIAGNOSIS — I700 Atherosclerosis of aorta: Secondary | ICD-10-CM

## 2016-06-28 DIAGNOSIS — G4700 Insomnia, unspecified: Secondary | ICD-10-CM | POA: Diagnosis not present

## 2016-06-28 DIAGNOSIS — Z7984 Long term (current) use of oral hypoglycemic drugs: Secondary | ICD-10-CM

## 2016-06-28 DIAGNOSIS — Z7982 Long term (current) use of aspirin: Secondary | ICD-10-CM

## 2016-06-28 DIAGNOSIS — C50919 Malignant neoplasm of unspecified site of unspecified female breast: Secondary | ICD-10-CM

## 2016-06-28 NOTE — Progress Notes (Signed)
Patient denies any concerns today.  

## 2016-07-04 ENCOUNTER — Other Ambulatory Visit: Payer: Self-pay

## 2016-07-04 ENCOUNTER — Other Ambulatory Visit: Payer: Self-pay | Admitting: *Deleted

## 2016-07-04 ENCOUNTER — Inpatient Hospital Stay
Admission: RE | Admit: 2016-07-04 | Discharge: 2016-07-04 | Disposition: A | Discharge status: Home / Self Care | Payer: Self-pay | Source: Ambulatory Visit | Attending: *Deleted | Admitting: *Deleted

## 2016-07-04 DIAGNOSIS — Z170 Estrogen receptor positive status [ER+]: Principal | ICD-10-CM

## 2016-07-04 DIAGNOSIS — C50912 Malignant neoplasm of unspecified site of left female breast: Secondary | ICD-10-CM

## 2016-07-04 DIAGNOSIS — Z9289 Personal history of other medical treatment: Secondary | ICD-10-CM

## 2016-07-11 ENCOUNTER — Ambulatory Visit: Payer: 59 | Admitting: Physical Therapy

## 2016-07-11 DIAGNOSIS — M25512 Pain in left shoulder: Secondary | ICD-10-CM

## 2016-07-11 DIAGNOSIS — M25612 Stiffness of left shoulder, not elsewhere classified: Secondary | ICD-10-CM

## 2016-07-11 DIAGNOSIS — R29898 Other symptoms and signs involving the musculoskeletal system: Secondary | ICD-10-CM

## 2016-07-11 DIAGNOSIS — I972 Postmastectomy lymphedema syndrome: Secondary | ICD-10-CM

## 2016-07-11 DIAGNOSIS — L599 Disorder of the skin and subcutaneous tissue related to radiation, unspecified: Secondary | ICD-10-CM

## 2016-07-11 NOTE — Therapy (Signed)
[place] MAIN [facility] SERVICES [address] , [place], [address] Phone: [phone]   Fax:  [phone]  Physical Therapy Treatment  Patient Details  Name: Amanda [name] MRN: [id number] Date of Birth: 06-01-1950 Referring Provider: Lloyd Huger  Encounter Date: 07/11/2016      PT End of Session - 07/11/16 0957    Visit Number 3   Number of Visits 17   Date for PT Re-Evaluation 08/14/16   PT Start Time 0850   PT Stop Time 0930   PT Time Calculation (min) 40 min   Activity Tolerance Patient tolerated treatment well   Behavior During Therapy [facility] for tasks assessed/performed      Past Medical History:  Diagnosis Date  . Back pain    occasionally  . Breast cancer ([place])    left  . Depression    takes Paxil and Wellbutrin daily  . Diabetes ([place])    takes Metformin daily  . GERD (gastroesophageal reflux disease)   . History of bronchitis 2 yrs ago  . Hyperlipidemia    takes Atorvastatin daily  . Hypertension    takes Losartan-HCTZ daily  . Insomnia    takes Ambien nightly  . Insomnia    takes gabapentin nightly  . Joint pain   . Migraine   . Mood swings ([place])   . Osteoarthritis of knee   . PONV (postoperative nausea and vomiting)   . Seasonal allergies    takes Allegra daily    Past Surgical History:  Procedure Laterality Date  . BREAST BIOPSY  2009  . cataract surgery Bilateral   . COLONOSCOPY    . JOINT REPLACEMENT Left 2014   knee  . KNEE ARTHROSCOPY Left    x 5  . MASTECTOMY Left   . port a cath placed    . PORTACATH PLACEMENT N/A 09/17/2015   Procedure: INSERTION PORT-A-CATH;  Surgeon: Jules Husbands, MD;  Location: [facility];  Service: General;  Laterality: N/A;  . TOTAL SHOULDER ARTHROPLASTY Right 06/03/2015   Procedure: TOTAL SHOULDER ARTHROPLASTY;  Surgeon: Tania Ade, MD;  Location: [place];  Service: Orthopedics;  Laterality: Right;  Right total shoulder arthroplasty  . TOTAL SHOULDER  REPLACEMENT Right 06/03/2015  . TUBAL LIGATION      There were no vitals filed for this visit.      Subjective Assessment - 07/11/16 0858    Subjective Pt reports shoulder pain with overhead reaching ( hanging decorations on the ceiling, wall and putting away dishes) .    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  Then adjuvant radiation.  Was doing fine and when I saw my oncologist for my checkup, the doctor sent her for PT for lymphedema.  Did okay but didn't keep up with self-care for that. July 2017 noted an enlarged left neck lymph node. Had multiple scans and US guided biopsy and found metastatic disease. Metastasis is in L4.  Also had some in chest wall.  She denies other bony metastases, including the left shoulder. Was in a research study and had palliative radiation to her low back.  Had 3 month CAT scan on Monday, and the areas of concern have shrunk.  Has foot neuropathy.  Diabetes; HTN; h/o right total shoulder replacement, left knee replacement.   Patient Stated Goals She wants to be able to be independent with aquatic program for strengthening.   Pain Onset  More than a month ago                     Adult Aquatic Therapy - 07/11/16 0956      Aquatic Therapy Subjective   Subjective pt had no complaints during/after  exercises          O: Pt entered/exited the pool via steps with single UE support on rail.  50 ft =1 lap  Exercises performed in 3'6" depth   Seated on bench: 5 reps each side upper trap/ levator stretches, cued for less forward head  10 reps Shoulder circles , cued for less forward head, instructed retraction, depression    10 reps L/ R each : lateral scoots for lower trap depression     4 laps floating on back  with noodles under neck,armpits, back, thigh: UE/LE abd/add "snow angels"     standing by rail: mini sqat  position Downgraded retraction exercise due elevation of upper trap Instructed shoulder depression only  10 reps, 5 sec holds   Seated on bench: shoulder depression only  10 reps, 5 sec holds , palms pressing on bench, ER  Cued for cervical retraction  Simulated overreaching tasks Semi tandem stance for more stability, cued for scapular control with reaching to put away dishes / hanging decorations to prepare pt in functional activities  5 reps x 2    Seated rest break on bench 2'                PT Long Term Goals - 07/11/16 1000      PT LONG TERM GOAL #1   Title Patient will be independent in home exercise program to improve strength/mobility for better functional independence with ADLs.   Time 8   Period Weeks   Status On-going     PT LONG TERM GOAL #2   Title Patient will increase BLE gross strength to 4+/5 as to improve functional strength for independent gait, increased standing tolerance and increased ADL ability.   Baseline -4/5 left shoulder abd and left shoulder flex   Time 8   Period Weeks   Status On-going     PT LONG TERM GOAL #3   Title Patient will report a worst pain of 2/10 on VAS in  left shouder           to improve tolerance with ADLs and reduced symptoms with activities.    Baseline 4/10 left shoulder   Time 8   Period Weeks   Status On-going     PT LONG TERM GOAL #4   Title Patient will decrease Quick DASH score by > 8 points demonstrating reduced self-reported upper extremity disability.   Baseline 13.63 % quick dahs   Time 8   Period Weeks   Status On-going           Long Term Clinic Goals - 05/18/16 1215      CC Long Term Goal  #1   Title Pt. will report at least 60% decrease in left upper trap/shoulder area pain.   Baseline Pt states she has decreased by at least 60 % She still has pain with some activity    Status Achieved     CC Long Term Goal  #2   Title Patient will be knowledgeable about self-care techniques and tools  available for managing her mild left UE lymphedema.   Status Achieved     CC Long Term Goal  #3   Title left shoulder active  flexion at least 150 degrees   Baseline 153 on  05/09/2016   Status Achieved     CC Long Term Goal  #4   Title left shoulder active abduction at least 155 degrees   Baseline 155 on 05/09/2016   Status Achieved            Plan - 07/11/16 0957    Clinical Impression Statement Pt tolerated today's session which included scapular motor control /strengthening training. Pt demonstrated a less forward head posture and proper coordination of scapular stabilizing mm with rotator cuff activation in overhead reaching positions following Tx. Pt was provided HEP to minimize upper trap overactivation/ tensions and exercises to strengthen and activate lower trap. Withholding scapular retraction strengthening until pt demo less upper trap overactivity. Pt continues to benefit from skilled PT.     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    PT Frequency 2x / week   PT Duration 8 weeks   PT Treatment/Interventions ADLs/Self Care Home Management;Aquatic Therapy;Functional mobility training;Therapeutic exercise;Therapeutic activities;Patient/family education   Consulted and Agree with Plan of Care Patient      Patient will benefit from skilled therapeutic intervention in order to improve the following deficits and impairments:  Pain, Increased fascial restricitons, Decreased range of motion, Increased edema, Impaired flexibility, Decreased strength, Decreased activity tolerance  Visit Diagnosis: Stiffness of left shoulder, not elsewhere classified  Left shoulder pain, unspecified chronicity  Disorder of the skin and subcutaneous tissue related to radiation, unspecified  Other symptoms and signs involving the musculoskeletal system  Postmastectomy  lymphedema     Problem List Patient Active Problem List   Diagnosis Date Noted  . Metastatic breast cancer ([place]) 09/24/2015  . 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 Personal history of breast cancer 08/23/2015  . 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 Degenerative arthritis of right shoulder region 01/13/2015  . Cancer of left female breast  ([place]) 07/27/2014  . 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 Rotator cuff tear 06/10/2014  . Allergic rhinitis, seasonal 09/16/2013  . Depression 08/10/2013  . Diabetes mellitus type 2, uncomplicated ([place]) 65/53/7482  . Hyperlipidemia, unspecified 08/10/2013  . BP (high blood pressure) 08/10/2013  . Osteoporosis, post-menopausal 08/10/2013    Jerl Mina ,PT, DPT, E-RYT  07/11/2016, 10:00 AM  [place] MAIN [facility] SERVICES [address] [place], [place], [address] Phone: [phone]   Fax:  [phone]  Name: Amanda [name] MRN: [id number] Date of Birth: [birth year]

## 2016-07-12 ENCOUNTER — Ambulatory Visit
Admission: RE | Admit: 2016-07-12 | Discharge: 2016-07-12 | Disposition: A | Discharge status: Home / Self Care | Payer: 59 | Source: Ambulatory Visit | Attending: Radiation Oncology | Admitting: Radiation Oncology

## 2016-07-12 ENCOUNTER — Other Ambulatory Visit: Payer: Self-pay | Admitting: *Deleted

## 2016-07-12 VITALS — BP 130/77 | HR 85 | Temp 96.0°F | Resp 20 | Wt 214.6 lb

## 2016-07-12 DIAGNOSIS — C50912 Malignant neoplasm of unspecified site of left female breast: Secondary | ICD-10-CM | POA: Diagnosis not present

## 2016-07-12 DIAGNOSIS — Z923 Personal history of irradiation: Secondary | ICD-10-CM | POA: Diagnosis not present

## 2016-07-12 DIAGNOSIS — C50919 Malignant neoplasm of unspecified site of unspecified female breast: Secondary | ICD-10-CM

## 2016-07-12 DIAGNOSIS — C7951 Secondary malignant neoplasm of bone: Secondary | ICD-10-CM | POA: Insufficient documentation

## 2016-07-12 DIAGNOSIS — E669 Obesity, unspecified: Secondary | ICD-10-CM | POA: Diagnosis not present

## 2016-07-12 DIAGNOSIS — M545 Low back pain: Secondary | ICD-10-CM | POA: Insufficient documentation

## 2016-07-12 NOTE — Progress Notes (Signed)
Radiation Oncology Follow up Note  Name: Amanda [name]   Date:   07/12/2016 MRN:  [id number] DOB: Jan 23, 1950    This 66 y.o. female presents to the clinic today for persistent lower back pain in patient with known stage IV breast cancer with previous treatment to her lumbar spine for metastatic disease.  REFERRING PROVIDER: Sofie Hartigan, MD  HPI: Patient is a 66 year old female with known stage IV breast cancer previous a treated proximally 7 months prior for stage IV breast cancer with metastatic disease to her lumbar spine. She initially had excellent results from a palliative standpoint although recently is complaining increasing lower back pain. She is obese. She's having no trouble ambulating at this time.. Recent CT scan performed this month showed unchanged extensive sclerotic metastatic disease to the axilla and appendicular skeleton.  COMPLICATIONS OF TREATMENT: none  FOLLOW UP COMPLIANCE: keeps appointments   PHYSICAL EXAM:  BP 130/77   Pulse 85   Temp (!) 96 F (35.6 C)   Resp 20   Wt 214 lb 9.9 oz (97.3 kg)   BMI 36.84 kg/m  Range of motion of her lower extremities does not elicit pain motor sensory and DTR levels are lower extremities are equal and symmetric. No pain is elicited on deep palpation of her lumbar spine. Well-developed well-nourished patient in NAD. HEENT reveals PERLA, EOMI, discs not visualized.  Oral cavity is clear. No oral mucosal lesions are identified. Neck is clear without evidence of cervical or supraclavicular adenopathy. Lungs are clear to A&P. Cardiac examination is essentially unremarkable with regular rate and rhythm without murmur rub or thrill. Abdomen is benign with no organomegaly or masses noted. Motor sensory and DTR levels are equal and symmetric in the upper and lower extremities. Cranial nerves II through XII are grossly intact. Proprioception is intact. No peripheral adenopathy or edema is identified. No motor or sensory levels are  noted. Crude visual fields are within normal range.  RADIOLOGY RESULTS: CT scans reviewed bone scan ordered  PLAN: At this time of ordered a bone scan to more alleviate areas of metastatic involvement they be the root cause of her back pain. Also set up a follow-up appointment several days later to review those reports. Should there be an area of I believe may be causing her lower back pain may offer additional palliative radiation therapy.  I would like to take this opportunity to thank you for allowing me to participate in the care of your patient.Armstead Peaks., MD

## 2016-07-13 ENCOUNTER — Ambulatory Visit: Payer: 59 | Admitting: Physical Therapy

## 2016-07-13 DIAGNOSIS — I972 Postmastectomy lymphedema syndrome: Secondary | ICD-10-CM

## 2016-07-13 DIAGNOSIS — L599 Disorder of the skin and subcutaneous tissue related to radiation, unspecified: Secondary | ICD-10-CM

## 2016-07-13 DIAGNOSIS — R29898 Other symptoms and signs involving the musculoskeletal system: Secondary | ICD-10-CM

## 2016-07-13 DIAGNOSIS — M25612 Stiffness of left shoulder, not elsewhere classified: Secondary | ICD-10-CM | POA: Diagnosis not present

## 2016-07-13 DIAGNOSIS — M25512 Pain in left shoulder: Secondary | ICD-10-CM

## 2016-07-13 NOTE — Therapy (Signed)
[place] MAIN [facility] SERVICES [address] [place], [place], [address] Phone: [phone]   Fax:  [phone]  Physical Therapy Treatment  Patient Details  Name: Amanda [name] MRN: [id number] Date of Birth: 11-21-1950 Referring Provider: Lloyd Huger  Encounter Date: 07/13/2016      PT End of Session - 07/13/16 0925    Visit Number 4   Number of Visits 17   Date for PT Re-Evaluation 08/14/16   PT Start Time 0847   PT Stop Time 0925   PT Time Calculation (min) 38 min   Activity Tolerance Patient tolerated treatment well   Behavior During Therapy [facility] for tasks assessed/performed      Past Medical History:  Diagnosis Date  . Back pain    occasionally  . Breast cancer ([place])    left  . Depression    takes Paxil and Wellbutrin daily  . Diabetes ([place])    takes Metformin daily  . GERD (gastroesophageal reflux disease)   . History of bronchitis 2 yrs ago  . Hyperlipidemia    takes Atorvastatin daily  . Hypertension    takes Losartan-HCTZ daily  . Insomnia    takes Ambien nightly  . Insomnia    takes gabapentin nightly  . Joint pain   . Migraine   . Mood swings ([place])   . Osteoarthritis of knee   . PONV (postoperative nausea and vomiting)   . Seasonal allergies    takes Allegra daily    Past Surgical History:  Procedure Laterality Date  . BREAST BIOPSY  2009  . cataract surgery Bilateral   . COLONOSCOPY    . JOINT REPLACEMENT Left 2014   knee  . KNEE ARTHROSCOPY Left    x 5  . MASTECTOMY Left   . port a cath placed    . PORTACATH PLACEMENT N/A 09/17/2015   Procedure: INSERTION PORT-A-CATH;  Surgeon: Jules Husbands, MD;  Location: [facility];  Service: General;  Laterality: N/A;  . TOTAL SHOULDER ARTHROPLASTY Right 06/03/2015   Procedure: TOTAL SHOULDER ARTHROPLASTY;  Surgeon: Tania Ade, MD;  Location: [place];  Service: Orthopedics;  Laterality: Right;  Right total shoulder arthroplasty  . TOTAL SHOULDER  REPLACEMENT Right 06/03/2015  . TUBAL LIGATION      There were no vitals filed for this visit.      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  Then adjuvant radiation.  Was doing fine and when I saw my oncologist for my checkup, the doctor sent her for PT for lymphedema.  Did okay but didn't keep up with self-care for that. July 2017 noted an enlarged left neck lymph node. Had multiple scans and US guided biopsy and found metastatic disease. Metastasis is in L4.  Also had some in chest wall.  She denies other bony metastases, including the left shoulder. Was in a research study and had palliative radiation to her low back.  Had 3 month CAT scan on Monday, and the areas of concern have shrunk.  Has foot neuropathy.  Diabetes; HTN; h/o right total shoulder replacement, left knee replacement.   Patient Stated Goals She wants to be able to be independent with aquatic program for strengthening.   Pain Onset More than a month ago  Adult Aquatic Therapy - 07/13/16 0924      Aquatic Therapy Subjective   Subjective pt had no complaints during/after  exercises         Stretches: warm up Upper trap Sidebend     6 laps walking forward (3 laps) backward (3 laps) with shoulder ER to neutral with noodles. Cued for stepping on more ballmounds of feet, using pool line for propioception  2 laps sidestep with shoulder abduction/ adduction 30deg-60 deg with noodles in BUE.  4  laps sidestep + squat with cuing for alignment and deep core mm  L/R , educated application with ADLs (bending, pulling low drawers) . Holding noodle behind back for mid/ lower trap activation   Stretches  Self-hug Pat on back  Figure-4 stretch ( piriformis)     Seated rest break 5''                     PT Long Term Goals - 07/11/16 1000      PT LONG TERM GOAL #1   Title Patient will be independent in home exercise program to improve strength/mobility for better functional independence with ADLs.   Time 8   Period Weeks   Status On-going     PT LONG TERM GOAL #2   Title Patient will increase BLE gross strength to 4+/5 as to improve functional strength for independent gait, increased standing tolerance and increased ADL ability.   Baseline -4/5 left shoulder abd and left shoulder flex   Time 8   Period Weeks   Status On-going     PT LONG TERM GOAL #3   Title Patient will report a worst pain of 2/10 on VAS in  left shouder           to improve tolerance with ADLs and reduced symptoms with activities.    Baseline 4/10 left shoulder   Time 8   Period Weeks   Status On-going     PT LONG TERM GOAL #4   Title Patient will decrease Quick DASH score by > 8 points demonstrating reduced self-reported upper extremity disability.   Baseline 13.63 % quick dahs   Time 8   Period Weeks   Status On-going           Long Term Clinic Goals - 05/18/16 1215      CC Long Term Goal  #1   Title Pt. will report at least 60% decrease in left upper trap/shoulder area pain.   Baseline Pt states she has decreased by at least 60 % She still has pain with some activity    Status Achieved     CC Long Term Goal  #2   Title Patient will be knowledgeable about self-care techniques and tools available for managing her mild left UE lymphedema.   Status Achieved     CC Long Term Goal  #3   Title left shoulder active flexion at least 150 degrees   Baseline 153 on  05/09/2016   Status Achieved     CC Long Term Goal  #4   Title left shoulder active abduction at least 155 degrees   Baseline 155 on 05/09/2016   Status Achieved            Plan - 07/13/16 0925    Clinical Impression Statement Pt showed good carry over with a less  forward head posture today. Pt reports less pain with overhead reaching with proper scapular control with out cuing. Pt progressed to more gait  training with lower trap/ scapular retraction,  thoracolumbar/gluts/ hamstring and hip  strengthening,   Pt continues to benefit from skilled PT.    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    PT Frequency 2x / week   PT Duration 8 weeks   PT Treatment/Interventions ADLs/Self Care Home Management;Aquatic Therapy;Functional mobility training;Therapeutic exercise;Therapeutic activities;Patient/family education   Consulted and Agree with Plan of Care Patient      Patient will benefit from skilled therapeutic intervention in order to improve the following deficits and impairments:  Pain, Increased fascial restricitons, Decreased range of motion, Increased edema, Impaired flexibility, Decreased strength, Decreased activity tolerance  Visit Diagnosis: Stiffness of left shoulder, not elsewhere classified  Left shoulder pain, unspecified chronicity  Disorder of the skin and subcutaneous tissue related to radiation, unspecified  Other symptoms and signs involving the musculoskeletal system  Postmastectomy lymphedema     Problem List Patient Active Problem List   Diagnosis Date Noted  . Metastatic breast cancer ([place]) 09/24/2015  . 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 Personal history of breast cancer 08/23/2015  . 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 Degenerative arthritis of right shoulder region 01/13/2015  . Cancer of left female breast  ([place]) 07/27/2014  . 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 Rotator cuff tear 06/10/2014  . Allergic rhinitis, seasonal 09/16/2013  . Depression 08/10/2013  . Diabetes mellitus type 2, uncomplicated ([place]) 82/42/3536  .  Hyperlipidemia, unspecified 08/10/2013  . BP (high blood pressure) 08/10/2013  . Osteoporosis, post-menopausal 08/10/2013    Jerl Mina ,PT, DPT, E-RYT  07/13/2016, 9:27 AM  [place] MAIN [facility] SERVICES [address] [place], [place], [address] Phone: [phone]   Fax:  [phone]  Name: Amanda [name] MRN: [id number] Date of Birth: [birth year]

## 2016-07-14 ENCOUNTER — Other Ambulatory Visit: Payer: Self-pay | Admitting: *Deleted

## 2016-07-18 ENCOUNTER — Ambulatory Visit: Payer: 59 | Attending: Oncology | Admitting: Physical Therapy

## 2016-07-18 DIAGNOSIS — R29898 Other symptoms and signs involving the musculoskeletal system: Secondary | ICD-10-CM | POA: Diagnosis present

## 2016-07-18 DIAGNOSIS — I972 Postmastectomy lymphedema syndrome: Secondary | ICD-10-CM | POA: Diagnosis present

## 2016-07-18 DIAGNOSIS — L599 Disorder of the skin and subcutaneous tissue related to radiation, unspecified: Secondary | ICD-10-CM | POA: Diagnosis present

## 2016-07-18 DIAGNOSIS — M25612 Stiffness of left shoulder, not elsewhere classified: Secondary | ICD-10-CM | POA: Diagnosis not present

## 2016-07-18 DIAGNOSIS — M25512 Pain in left shoulder: Secondary | ICD-10-CM

## 2016-07-18 NOTE — Therapy (Signed)
[place] MAIN [facility] SERVICES [address] [place], [place], [address] Phone: [phone]   Fax:  [phone]  Physical Therapy Treatment  Patient Details  Name: Amanda [name] MRN: [id number] Date of Birth: 04-15-1950 Referring Provider: Lloyd Huger  Encounter Date: 07/18/2016      PT End of Session - 07/18/16 0924    Visit Number 5   Number of Visits 17   Date for PT Re-Evaluation 08/14/16   PT Start Time 0850   PT Stop Time 0930   PT Time Calculation (min) 40 min   Activity Tolerance Patient tolerated treatment well   Behavior During Therapy [facility] for tasks assessed/performed      Past Medical History:  Diagnosis Date  . Back pain    occasionally  . Breast cancer ([place])    left  . Depression    takes Paxil and Wellbutrin daily  . Diabetes ([place])    takes Metformin daily  . GERD (gastroesophageal reflux disease)   . History of bronchitis 2 yrs ago  . Hyperlipidemia    takes Atorvastatin daily  . Hypertension    takes Losartan-HCTZ daily  . Insomnia    takes Ambien nightly  . Insomnia    takes gabapentin nightly  . Joint pain   . Migraine   . Mood swings ([place])   . Osteoarthritis of knee   . PONV (postoperative nausea and vomiting)   . Seasonal allergies    takes Allegra daily    Past Surgical History:  Procedure Laterality Date  . BREAST BIOPSY  2009  . cataract surgery Bilateral   . COLONOSCOPY    . JOINT REPLACEMENT Left 2014   knee  . KNEE ARTHROSCOPY Left    x 5  . MASTECTOMY Left   . port a cath placed    . PORTACATH PLACEMENT N/A 09/17/2015   Procedure: INSERTION PORT-A-CATH;  Surgeon: Jules Husbands, MD;  Location: [facility];  Service: General;  Laterality: N/A;  . TOTAL SHOULDER ARTHROPLASTY Right 06/03/2015   Procedure: TOTAL SHOULDER ARTHROPLASTY;  Surgeon: Tania Ade, MD;  Location: [place];  Service: Orthopedics;  Laterality: Right;  Right total shoulder arthroplasty  . TOTAL SHOULDER  REPLACEMENT Right 06/03/2015  . TUBAL LIGATION      There were no vitals filed for this visit.      Subjective Assessment - 07/18/16 0916    Subjective Pt reports 50% less pain with overhead reaching. 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  Then adjuvant radiation.  Was doing fine and when I saw my oncologist for my checkup, the doctor sent her for PT for lymphedema.  Did okay but didn't keep up with self-care for that. July 2017 noted an enlarged left neck lymph node. Had multiple scans and US guided biopsy and found metastatic disease. Metastasis is in L4.  Also had some in chest wall.  She denies other bony metastases, including the left shoulder. Was in a research study and had palliative radiation to her low back.  Had 3 month CAT scan on Monday, and the areas of concern have shrunk.  Has foot neuropathy.  Diabetes; HTN; h/o right total shoulder replacement, left knee replacement.   Patient Stated Goals She wants to be  able to be independent with aquatic program for strengthening.   Pain Onset More than a month ago                     Adult Aquatic Therapy - 07/18/16 0923      Aquatic Therapy Subjective   Subjective pt had no complaints during/after  exercises       O: Pt entered/exited the pool via steps with single UE support on rail.  50 ft =1 lap  Exercises performed in 3'6" depth    Seated on bench: Self-hug Pat on back   Standing: Hip flexor stretch with overhead reaching   Figure-4 stretch ( piriformis)     4 laps walking forward/ backward with noodle behind waist for tricep/ lat activation 4 laps sidestep with shoulder abduction/ adduction 30deg-60 deg with noodles in BUE.  4  laps backward walking . Holding  noodle behind back for mid/ lower trap activation    Standing:  Figure-4 stretch ( piriformis)    Educated on how to translate these exercises into isometric exercises on land this week.                  PT Long Term Goals - 07/18/16 0954      PT LONG TERM GOAL #1   Title Patient will be independent in home exercise program to improve strength/mobility for better functional independence with ADLs.   Time 8   Period Weeks   Status On-going     PT LONG TERM GOAL #2   Title Patient will increase BLE gross strength to 4+/5 as to improve functional strength for independent gait, increased standing tolerance and increased ADL ability.   Baseline -4/5 left shoulder abd and left shoulder flex   Time 8   Period Weeks   Status On-going     PT LONG TERM GOAL #3   Title Patient will report a worst pain of 2/10 on VAS in  left shouder           to improve tolerance with ADLs and reduced symptoms with activities.    Baseline 4/10 left shoulder   Time 8   Period Weeks   Status Partially Met     PT LONG TERM GOAL #4   Title Patient will decrease Quick DASH score by > 8 points demonstrating reduced self-reported upper extremity disability.   Baseline 13.63 % quick dahs   Time 8   Period Weeks   Status On-going               Plan - 07/18/16 6720    Clinical Impression Statement Pt reports 50% improvement with shoulder pain. Pt showed full ROM with overhead reaching along with less forward head posture today. Pt progressed to 40 min session today instead of a 30 min session. Pt performed slow paced walking while strengthening scapular stabilizing mm and thoracolumbar mm. Pt was educated on activity pacing and energy conservation. Pt will be undergoing imagining end of this week to understand more about her hip pain and LBP. Pt continues to benefit from skilled PT to regain function of her L shoulder. Educated pt on how to translate today's program to isometric exercises to  perform on land this week. . Pt does not plan to join the pool until late summer.    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    PT Frequency  2x / week   PT Duration 8 weeks   PT Treatment/Interventions ADLs/Self Care Home Management;Aquatic Therapy;Functional mobility training;Therapeutic exercise;Therapeutic activities;Patient/family education   Consulted and Agree with Plan of Care Patient      Patient will benefit from skilled therapeutic intervention in order to improve the following deficits and impairments:  Pain, Increased fascial restricitons, Decreased range of motion, Increased edema, Impaired flexibility, Decreased strength, Decreased activity tolerance  Visit Diagnosis: Stiffness of left shoulder, not elsewhere classified  Left shoulder pain, unspecified chronicity     Problem List Patient Active Problem List   Diagnosis Date Noted  . Metastatic breast cancer ([place]) 09/24/2015  . 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 Personal history of breast cancer 08/23/2015  . 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 Degenerative arthritis of right shoulder region 01/13/2015  . Cancer of left female breast  ([place]) 07/27/2014  . 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 Rotator cuff tear 06/10/2014  . Allergic rhinitis, seasonal 09/16/2013  . Depression 08/10/2013  . Diabetes mellitus type 2, uncomplicated ([place]) 99/27/8004  . Hyperlipidemia, unspecified 08/10/2013  . BP (high blood pressure) 08/10/2013  . Osteoporosis, post-menopausal 08/10/2013    Jerl Mina ,PT, DPT, E-RYT  07/18/2016, 9:54 AM  [place] MAIN [facility] SERVICES [address] [place], [place], [address] Phone: [phone]   Fax:  [phone]  Name: LEONOR DARNELL MRN: [id number] Date  of Birth: 1950/03/16

## 2016-07-20 ENCOUNTER — Ambulatory Visit: Payer: 59 | Admitting: Physical Therapy

## 2016-07-20 DIAGNOSIS — M25612 Stiffness of left shoulder, not elsewhere classified: Secondary | ICD-10-CM

## 2016-07-20 DIAGNOSIS — L599 Disorder of the skin and subcutaneous tissue related to radiation, unspecified: Secondary | ICD-10-CM

## 2016-07-20 DIAGNOSIS — M25512 Pain in left shoulder: Secondary | ICD-10-CM

## 2016-07-20 DIAGNOSIS — R29898 Other symptoms and signs involving the musculoskeletal system: Secondary | ICD-10-CM

## 2016-07-20 DIAGNOSIS — I972 Postmastectomy lymphedema syndrome: Secondary | ICD-10-CM

## 2016-07-21 ENCOUNTER — Encounter
Admission: RE | Admit: 2016-07-21 | Discharge: 2016-07-21 | Disposition: A | Discharge status: Home / Self Care | Payer: 59 | Source: Ambulatory Visit | Attending: Radiation Oncology | Admitting: Radiation Oncology

## 2016-07-21 DIAGNOSIS — C50919 Malignant neoplasm of unspecified site of unspecified female breast: Secondary | ICD-10-CM | POA: Diagnosis not present

## 2016-07-21 MED ORDER — TECHNETIUM TC 99M MEDRONATE IV KIT
25.0000 | PACK | Freq: Once | INTRAVENOUS | Status: AC | PRN
  Administered 2016-07-21: 23.08 via INTRAVENOUS

## 2016-07-21 NOTE — Therapy (Signed)
[place] MAIN [facility] SERVICES [address] [place], [place], [address] Phone: [phone]   Fax:  [phone]  Physical Therapy Treatment  Patient Details  Name: Amanda [name] MRN: [id number] Date of Birth: [birth year] Referring Provider: Lloyd Huger  Encounter Date: 07/20/2016      PT End of Session - 07/21/16 2355    Visit Number 6   Number of Visits 17   Date for PT Re-Evaluation 08/14/16   PT Start Time 0850   PT Stop Time 0930   PT Time Calculation (min) 40 min   Activity Tolerance Patient tolerated treatment well   Behavior During Therapy [facility] for tasks assessed/performed      Past Medical History:  Diagnosis Date  . Back pain    occasionally  . Breast cancer ([place])    left  . Depression    takes Paxil and Wellbutrin daily  . Diabetes ([place])    takes Metformin daily  . GERD (gastroesophageal reflux disease)   . History of bronchitis 2 yrs ago  . Hyperlipidemia    takes Atorvastatin daily  . Hypertension    takes Losartan-HCTZ daily  . Insomnia    takes Ambien nightly  . Insomnia    takes gabapentin nightly  . Joint pain   . Migraine   . Mood swings ([place])   . Osteoarthritis of knee   . PONV (postoperative nausea and vomiting)   . Seasonal allergies    takes Allegra daily    Past Surgical History:  Procedure Laterality Date  . BREAST BIOPSY  2009  . cataract surgery Bilateral   . COLONOSCOPY    . JOINT REPLACEMENT Left 2014   knee  . KNEE ARTHROSCOPY Left    x 5  . MASTECTOMY Left   . port a cath placed    . PORTACATH PLACEMENT N/A 09/17/2015   Procedure: INSERTION PORT-A-CATH;  Surgeon: Jules Husbands, MD;  Location: [facility];  Service: General;  Laterality: N/A;  . TOTAL SHOULDER ARTHROPLASTY Right 06/03/2015   Procedure: TOTAL SHOULDER ARTHROPLASTY;  Surgeon: Tania Ade, MD;  Location: [place];  Service: Orthopedics;  Laterality: Right;  Right total shoulder arthroplasty  . TOTAL SHOULDER  REPLACEMENT Right 06/03/2015  . TUBAL LIGATION      There were no vitals filed for this visit.      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  Then adjuvant radiation.  Was doing fine and when I saw my oncologist for my checkup, the doctor sent her for PT for lymphedema.  Did okay but didn't keep up with self-care for that. July 2017 noted an enlarged left neck lymph node. Had multiple scans and US guided biopsy and found metastatic disease. Metastasis is in L4.  Also had some in chest wall.  She denies other bony metastases, including the left shoulder. Was in a research study and had palliative radiation to her low back.  Had 3 month CAT scan on Monday, and the areas of concern have shrunk.  Has foot neuropathy.  Diabetes; HTN; h/o right total shoulder replacement, left knee replacement.   Patient Stated Goals She wants to be able to be independent with aquatic program for strengthening.   Pain Onset More than a month  ago           Adult Aquatic Therapy - 07/18/16 0923            Aquatic Therapy Subjective   Subjective pt had no complaints during/after  exercises       O: Pt entered/exited the pool via steps with single UE support on rail.  50 ft =1 lap  Exercises performed in 3'6" depth    Seated on bench: Self-hug Pat on back   Standing: Hip flexor stretch with overhead reaching   Figure-4 stretch ( piriformis)     4 laps walking backward with noodle with shoulder ER in 4'0" depth for more resistance training 2 laps sidestep with shoulder abduction/ adduction 30deg-60 deg with dumbbells in BUE. Weighted waist belt donned in 3'6" depth 4 laps forward walking . Holding noodle behind back for mid/  lower trap activation. Cued for high knees.  Weighted waist belt donned in 3'6" depth   Standing:  Figure-4 stretch ( piriformis)    Educated on how to translate these exercises into isometric exercises on land this week.                                  PT Long Term Goals - 07/18/16 0954      PT LONG TERM GOAL #1   Title Patient will be independent in home exercise program to improve strength/mobility for better functional independence with ADLs.   Time 8   Period Weeks   Status On-going     PT LONG TERM GOAL #2   Title Patient will increase BLE gross strength to 4+/5 as to improve functional strength for independent gait, increased standing tolerance and increased ADL ability.   Baseline -4/5 left shoulder abd and left shoulder flex   Time 8   Period Weeks   Status On-going     PT LONG TERM GOAL #3   Title Patient will report a worst pain of 2/10 on VAS in  left shouder           to improve tolerance with ADLs and reduced symptoms with activities.    Baseline 4/10 left shoulder   Time 8   Period Weeks   Status Partially Met     PT LONG TERM GOAL #4   Title Patient will decrease Quick DASH score by > 8 points demonstrating reduced self-reported upper extremity disability.   Baseline 13.63 % quick dahs   Time 8   Period Weeks   Status On-going               Plan - 07/21/16 8264    Clinical Impression Statement Pt required less cuing for cervical retraction and upright posture compared to previus sessions. Pt progressed to exercises at a deep depth without complaints. Added weighted waist belt with exercises in shallow depth to challenge postural stability. Pt continues to benefit from skilled PT.  Plan to provide resistance band training in gym setting to promote HEP that can be performed on land to accelerate pt's strengthening program.     Rehab Potential Good   Clinical Impairments Affecting Rehab Potential previous left  inflammatory breast cancer with ALND and radiation resulting in adherent scar    PT Frequency 2x / week   PT Duration 8 weeks   PT Treatment/Interventions ADLs/Self Care Home Management;Aquatic Therapy;Functional mobility training;Therapeutic exercise;Therapeutic activities;Patient/family education   Consulted and Agree with Plan of Care Patient  Patient will benefit from skilled therapeutic intervention in order to improve the following deficits and impairments:  Pain, Increased fascial restricitons, Decreased range of motion, Increased edema, Impaired flexibility, Decreased strength, Decreased activity tolerance  Visit Diagnosis: Stiffness of left shoulder, not elsewhere classified  Left shoulder pain, unspecified chronicity  Disorder of the skin and subcutaneous tissue related to radiation, unspecified  Other symptoms and signs involving the musculoskeletal system  Postmastectomy lymphedema     Problem List Patient Active Problem List   Diagnosis Date Noted  . Metastatic breast cancer ([place]) 09/24/2015  . 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 Personal history of breast cancer 08/23/2015  . 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 Degenerative arthritis of right shoulder region 01/13/2015  . Cancer of left female breast  ([place]) 07/27/2014  . 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 Rotator cuff tear 06/10/2014  . Allergic rhinitis, seasonal 09/16/2013  . Depression 08/10/2013  . Diabetes mellitus type 2, uncomplicated ([place]) 21/79/8102  . Hyperlipidemia, unspecified 08/10/2013  . BP (high blood pressure) 08/10/2013  . Osteoporosis, post-menopausal 08/10/2013    Jerl Mina ,PT, DPT, E-RYT  07/21/2016, 8:53 AM  [place] MAIN [facility] SERVICES [address] [place], [place], [address] Phone: [phone]   Fax:  [phone]  Name: DONNELLE RUBEY MRN:  [id number] Date of Birth: 1950-05-15

## 2016-07-24 ENCOUNTER — Encounter: Payer: Self-pay | Admitting: Radiation Oncology

## 2016-07-24 ENCOUNTER — Ambulatory Visit
Admission: RE | Admit: 2016-07-24 | Discharge: 2016-07-24 | Disposition: A | Discharge status: Home / Self Care | Payer: 59 | Source: Ambulatory Visit | Attending: Radiation Oncology | Admitting: Radiation Oncology

## 2016-07-24 VITALS — BP 133/83 | HR 78 | Temp 95.5°F | Resp 20 | Wt 214.1 lb

## 2016-07-24 DIAGNOSIS — Z510 Encounter for antineoplastic radiation therapy: Secondary | ICD-10-CM | POA: Insufficient documentation

## 2016-07-24 DIAGNOSIS — M549 Dorsalgia, unspecified: Secondary | ICD-10-CM | POA: Diagnosis not present

## 2016-07-24 DIAGNOSIS — Z923 Personal history of irradiation: Secondary | ICD-10-CM | POA: Diagnosis not present

## 2016-07-24 DIAGNOSIS — C50912 Malignant neoplasm of unspecified site of left female breast: Secondary | ICD-10-CM | POA: Insufficient documentation

## 2016-07-24 DIAGNOSIS — Z170 Estrogen receptor positive status [ER+]: Secondary | ICD-10-CM | POA: Insufficient documentation

## 2016-07-24 DIAGNOSIS — C7951 Secondary malignant neoplasm of bone: Secondary | ICD-10-CM | POA: Diagnosis not present

## 2016-07-24 NOTE — Progress Notes (Signed)
Radiation Oncology Follow up Note  Name: Amanda [name]   Date:   07/24/2016 MRN:  [id number] DOB: [birth year]    This 66 y.o. female presents to the clinic today for follow-up status post bone scan for stage IV breast cancer with increasing back pain.  REFERRING PROVIDER: Sofie Hartigan, MD  HPI: Patient is a 66 year old female who I previously saw her about 2 weeks prior for increasing back pain. She's previously had palliative radiation therapy to her lumbar spine for stage IV breast cancer. I ordered a bone scan. Showing prominent radiotracer uptake in T11 tweet T12 vertebral bodies consistent with metastatic disease. Areas of previous radiation therapy were within normal limits showing evidence of good response. She continues to do fairly well she's having no lower extremity weakness or sensory level.  COMPLICATIONS OF TREATMENT: none  FOLLOW UP COMPLIANCE: keeps appointments   PHYSICAL EXAM:  BP 133/83   Pulse 78   Temp (!) 95.5 F (35.3 C)   Resp 20   Wt 214 lb 1.1 oz (97.1 kg)   BMI 36.74 kg/m  Well-developed well-nourished patient in NAD. HEENT reveals PERLA, EOMI, discs not visualized.  Oral cavity is clear. No oral mucosal lesions are identified. Neck is clear without evidence of cervical or supraclavicular adenopathy. Lungs are clear to A&P. Cardiac examination is essentially unremarkable with regular rate and rhythm without murmur rub or thrill. Abdomen is benign with no organomegaly or masses noted. Motor sensory and DTR levels are equal and symmetric in the upper and lower extremities. Cranial nerves II through XII are grossly intact. Proprioception is intact. No peripheral adenopathy or edema is identified. No motor or sensory levels are noted. Crude visual fields are within normal range.  RADIOLOGY RESULTS: Bone scan is reviewed and compatible with the above-stated findings  PLAN: At this time I to go ahead with palliative radiation therapy to the T11-T12 region. Would  plan on delivering 3000 cGy in 10 fractions. Risks and benefits of treatment including possible skin reaction fatigue diarrhea alteration of blood counts all were discussed in detail with the patient. She seems to compress my treatment plan well. I have personally ordered and scheduled CT simulation for later this week.  I would like to take this opportunity to thank you for allowing me to participate in the care of your patient.Armstead Peaks., MD

## 2016-07-25 ENCOUNTER — Ambulatory Visit: Payer: 59 | Admitting: Physical Therapy

## 2016-07-25 DIAGNOSIS — L599 Disorder of the skin and subcutaneous tissue related to radiation, unspecified: Secondary | ICD-10-CM

## 2016-07-25 DIAGNOSIS — I972 Postmastectomy lymphedema syndrome: Secondary | ICD-10-CM

## 2016-07-25 DIAGNOSIS — M25612 Stiffness of left shoulder, not elsewhere classified: Secondary | ICD-10-CM

## 2016-07-25 DIAGNOSIS — R29898 Other symptoms and signs involving the musculoskeletal system: Secondary | ICD-10-CM

## 2016-07-25 DIAGNOSIS — M25512 Pain in left shoulder: Secondary | ICD-10-CM

## 2016-07-25 NOTE — Therapy (Signed)
[place] MAIN [facility] SERVICES [address] [place], [place], [address] Phone: [phone]   Fax:  [phone]  Physical Therapy  Discharge Note  Patient Details  Name: Amanda [name] MRN: [id number] Date of Birth: 01-Jul-1950 Referring Provider: Lloyd Huger  Encounter Date: 07/25/2016      PT End of Session - 07/25/16 0941    Visit Number 7   Number of Visits 17   Date for PT Re-Evaluation 08/14/16   PT Start Time 0900   PT Stop Time 0945   PT Time Calculation (min) 45 min   Activity Tolerance Patient tolerated treatment well   Behavior During Therapy [facility] for tasks assessed/performed      Past Medical History:  Diagnosis Date  . Back pain    occasionally  . Breast cancer ([place])    left  . Depression    takes Paxil and Wellbutrin daily  . Diabetes ([place])    takes Metformin daily  . GERD (gastroesophageal reflux disease)   . History of bronchitis 2 yrs ago  . Hyperlipidemia    takes Atorvastatin daily  . Hypertension    takes Losartan-HCTZ daily  . Insomnia    takes Ambien nightly  . Insomnia    takes gabapentin nightly  . Joint pain   . Migraine   . Mood swings ([place])   . Osteoarthritis of knee   . PONV (postoperative nausea and vomiting)   . Seasonal allergies    takes Allegra daily    Past Surgical History:  Procedure Laterality Date  . BREAST BIOPSY  2009  . cataract surgery Bilateral   . COLONOSCOPY    . JOINT REPLACEMENT Left 2014   knee  . KNEE ARTHROSCOPY Left    x 5  . MASTECTOMY Left   . port a cath placed    . PORTACATH PLACEMENT N/A 09/17/2015   Procedure: INSERTION PORT-A-CATH;  Surgeon: Jules Husbands, MD;  Location: [facility];  Service: General;  Laterality: N/A;  . TOTAL SHOULDER ARTHROPLASTY Right 06/03/2015   Procedure: TOTAL SHOULDER ARTHROPLASTY;  Surgeon: Tania Ade, MD;  Location: [place];  Service: Orthopedics;  Laterality: Right;  Right total shoulder arthroplasty  . TOTAL SHOULDER  REPLACEMENT Right 06/03/2015  . TUBAL LIGATION      There were no vitals filed for this visit.         Plan - 07/25/16 0941    Clinical Impression Statement Pt has achieved 100% of her goals. Pt 's DASH score decreased from 13% to 8%, indicating better function. Pt has regained full ROM of L shoulder and has increased strength. Pt has been able to place plates into overhead cabinets without pain. Pt also has shown improved upright posture, less rounded shoulders and forward head with the strengthen /posture training.  Pt reports 50% improvement with aquatic Tx and will seeing a lymphedema specialist this week to address any lymphedema issues that may be present related to her L shoulder.  Pt is also undergoing radiation Tx in the upcoming weeks for the positive finding of malignant growth at her thoracic spine shown on last week's imaging workup. Pt is ready for d/c at this time.      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    PT Frequency 2x / week   PT Duration 8 weeks   PT Treatment/Interventions ADLs/Self Care Home Management;Aquatic Therapy;Functional mobility  training;Therapeutic exercise;Therapeutic activities;Patient/family education   Consulted and Agree with Plan of Care Patient           PT Long Term Goals - 07/25/16 1165      PT LONG TERM GOAL #1   Title Patient will be independent in home exercise program to improve strength/mobility for better functional independence with ADLs.   Time 8   Period Weeks   Status Achieved     PT LONG TERM GOAL #2   Title Patient will increase BLE gross strength to 4+/5 as to improve functional strength for independent gait, increased standing tolerance and increased ADL ability.   Baseline -4/5 left shoulder abd and left shoulder flex   Time 8   Period Weeks   Status Achieved     PT LONG TERM GOAL #3   Title  Patient will report a worst pain of 2/10 on VAS in  left shouder           to improve tolerance with ADLs and reduced symptoms with activities.    Baseline 4/10 left shoulder   Time 8   Period Weeks   Status Achieved     PT LONG TERM GOAL #4   Title Patient will decrease Quick DASH score by > 8 points demonstrating reduced self-reported upper extremity disability.   Baseline 13.63 % quick dahs --> 7/10:  8.33%    Time 8   Period Weeks   Status Achieved         Patient will benefit from skilled therapeutic intervention in order to improve the following deficits and impairments:  Pain, Increased fascial restricitons, Decreased range of motion, Increased edema, Impaired flexibility, Decreased strength, Decreased activity tolerance  Visit Diagnosis: Stiffness of left shoulder, not elsewhere classified  Left shoulder pain, unspecified chronicity  Disorder of the skin and subcutaneous tissue related to radiation, unspecified  Other symptoms and signs involving the musculoskeletal system  Postmastectomy lymphedema     Problem List Patient Active Problem List   Diagnosis Date Noted  . Metastatic breast cancer ([place]) 09/24/2015  . 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 Personal history of breast cancer 08/23/2015  . 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 Degenerative arthritis of right shoulder region 01/13/2015  . Cancer of left female breast  ([place]) 07/27/2014  . 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 Rotator cuff tear 06/10/2014  . Allergic rhinitis, seasonal 09/16/2013  . Depression 08/10/2013  . Diabetes mellitus type 2, uncomplicated ([place]) 79/03/8331  . Hyperlipidemia, unspecified 08/10/2013  . BP (high blood pressure) 08/10/2013  . Osteoporosis, post-menopausal 08/10/2013    Jerl Mina ,PT, DPT, E-RYT  07/25/2016, 9:49 AM  [place] MAIN [facility] SERVICES [address] [place], [place], [address] Phone: [phone]   Fax:  [phone]  Name: Amanda [name] MRN: [id number] Date of Birth: Jan 04, 1951

## 2016-07-26 ENCOUNTER — Ambulatory Visit
Admission: RE | Admit: 2016-07-26 | Discharge: 2016-07-26 | Disposition: A | Discharge status: Home / Self Care | Payer: 59 | Source: Ambulatory Visit | Attending: Radiation Oncology | Admitting: Radiation Oncology

## 2016-07-26 DIAGNOSIS — Z510 Encounter for antineoplastic radiation therapy: Secondary | ICD-10-CM | POA: Diagnosis not present

## 2016-07-27 ENCOUNTER — Other Ambulatory Visit: Payer: Managed Care, Other (non HMO)

## 2016-07-27 ENCOUNTER — Ambulatory Visit: Payer: 59 | Admitting: Occupational Therapy

## 2016-07-27 ENCOUNTER — Ambulatory Visit: Payer: Managed Care, Other (non HMO) | Admitting: Oncology

## 2016-07-27 ENCOUNTER — Encounter: Payer: Self-pay | Admitting: Occupational Therapy

## 2016-07-27 DIAGNOSIS — M25512 Pain in left shoulder: Secondary | ICD-10-CM

## 2016-07-27 DIAGNOSIS — I972 Postmastectomy lymphedema syndrome: Secondary | ICD-10-CM

## 2016-07-27 DIAGNOSIS — M25612 Stiffness of left shoulder, not elsewhere classified: Secondary | ICD-10-CM

## 2016-07-27 DIAGNOSIS — Z510 Encounter for antineoplastic radiation therapy: Secondary | ICD-10-CM | POA: Diagnosis not present

## 2016-07-28 ENCOUNTER — Other Ambulatory Visit: Payer: Self-pay | Admitting: *Deleted

## 2016-07-28 DIAGNOSIS — C7951 Secondary malignant neoplasm of bone: Secondary | ICD-10-CM

## 2016-07-31 ENCOUNTER — Ambulatory Visit
Admission: RE | Admit: 2016-07-31 | Discharge: 2016-07-31 | Disposition: A | Discharge status: Home / Self Care | Payer: 59 | Source: Ambulatory Visit | Attending: Radiation Oncology | Admitting: Radiation Oncology

## 2016-07-31 DIAGNOSIS — Z510 Encounter for antineoplastic radiation therapy: Secondary | ICD-10-CM | POA: Diagnosis not present

## 2016-08-01 ENCOUNTER — Other Ambulatory Visit: Payer: Self-pay | Admitting: *Deleted

## 2016-08-01 ENCOUNTER — Ambulatory Visit
Admission: RE | Admit: 2016-08-01 | Discharge: 2016-08-01 | Disposition: A | Discharge status: Home / Self Care | Payer: 59 | Source: Ambulatory Visit | Attending: Radiation Oncology | Admitting: Radiation Oncology

## 2016-08-01 ENCOUNTER — Ambulatory Visit: Payer: 59 | Admitting: Physical Therapy

## 2016-08-01 DIAGNOSIS — Z510 Encounter for antineoplastic radiation therapy: Secondary | ICD-10-CM | POA: Diagnosis not present

## 2016-08-01 DIAGNOSIS — Z170 Estrogen receptor positive status [ER+]: Principal | ICD-10-CM

## 2016-08-01 DIAGNOSIS — C50912 Malignant neoplasm of unspecified site of left female breast: Secondary | ICD-10-CM

## 2016-08-01 MED ORDER — HYDROCODONE-ACETAMINOPHEN 5-325 MG PO TABS: 1.0000 | ORAL_TABLET | ORAL | 0 refills | Status: DC | PRN

## 2016-08-02 ENCOUNTER — Ambulatory Visit
Admission: RE | Admit: 2016-08-02 | Discharge: 2016-08-02 | Disposition: A | Discharge status: Home / Self Care | Payer: 59 | Source: Ambulatory Visit | Attending: Radiation Oncology | Admitting: Radiation Oncology

## 2016-08-02 DIAGNOSIS — Z510 Encounter for antineoplastic radiation therapy: Secondary | ICD-10-CM | POA: Diagnosis not present

## 2016-08-03 ENCOUNTER — Ambulatory Visit: Payer: 59 | Admitting: Physical Therapy

## 2016-08-03 ENCOUNTER — Ambulatory Visit
Admission: RE | Admit: 2016-08-03 | Discharge: 2016-08-03 | Disposition: A | Discharge status: Home / Self Care | Payer: 59 | Source: Ambulatory Visit | Attending: Radiation Oncology | Admitting: Radiation Oncology

## 2016-08-03 DIAGNOSIS — Z510 Encounter for antineoplastic radiation therapy: Secondary | ICD-10-CM | POA: Diagnosis not present

## 2016-08-04 ENCOUNTER — Ambulatory Visit
Admission: RE | Admit: 2016-08-04 | Discharge: 2016-08-04 | Disposition: A | Discharge status: Home / Self Care | Payer: 59 | Source: Ambulatory Visit | Attending: Radiation Oncology | Admitting: Radiation Oncology

## 2016-08-04 DIAGNOSIS — Z510 Encounter for antineoplastic radiation therapy: Secondary | ICD-10-CM | POA: Diagnosis not present

## 2016-08-04 NOTE — Therapy (Signed)
[facility] [address], [place], [address] Phone: [phone]   Fax:  [phone]  Occupational Therapy Evaluation  Patient Details  Name: Amanda [name] MRN: [id number] Date of Birth: 07/25/1950 Referring Provider: Grayland Ormond  Encounter Date: 07/27/2016      OT End of Session - 08/04/16 0442    Visit Number 1   Number of Visits 24   Date for OT Re-Evaluation 10/05/16   OT Start Time 1420   OT Stop Time 1530   OT Time Calculation (min) 70 min   Activity Tolerance Patient tolerated treatment well   Behavior During Therapy [facility] for tasks assessed/performed      Past Medical History:  Diagnosis Date  . Back pain    occasionally  . Breast cancer ([place])    left  . Depression    takes Paxil and Wellbutrin daily  . Diabetes ([place])    takes Metformin daily  . GERD (gastroesophageal reflux disease)   . History of bronchitis 2 yrs ago  . Hyperlipidemia    takes Atorvastatin daily  . Hypertension    takes Losartan-HCTZ daily  . Insomnia    takes Ambien nightly  . Insomnia    takes gabapentin nightly  . Joint pain   . Migraine   . Mood swings ([place])   . Osteoarthritis of knee   . PONV (postoperative nausea and vomiting)   . Seasonal allergies    takes Allegra daily    Past Surgical History:  Procedure Laterality Date  . BREAST BIOPSY  2009  . cataract surgery Bilateral   . COLONOSCOPY    . JOINT REPLACEMENT Left 2014   knee  . KNEE ARTHROSCOPY Left    x 5  . MASTECTOMY Left   . port a cath placed    . PORTACATH PLACEMENT N/A 09/17/2015   Procedure: INSERTION PORT-A-CATH;  Surgeon: Jules Husbands, MD;  Location: [facility];  Service: General;  Laterality: N/A;  . TOTAL SHOULDER ARTHROPLASTY Right 06/03/2015   Procedure: TOTAL SHOULDER ARTHROPLASTY;  Surgeon: Tania Ade, MD;  Location: [facility];  Service: Orthopedics;  Laterality: Right;  Right total shoulder arthroplasty  . TOTAL SHOULDER  REPLACEMENT Right 06/03/2015  . TUBAL LIGATION      There were no vitals filed for this visit.      Subjective Assessment - 08/04/16 0421    Subjective  Patient reports she had therapy for lymphedema earlier this spring but she has not been wearing compression sleeve or glove on a regular basis.  She does wear a compression bra with a lymph pad daily.  She reports increased edema in  left UE recently and mild pain.     Pertinent History Patient was diagnosed with left breast cancer in 2010 with mastectomy and 11 lymph nodes removed.  She had 36 radiation treatments.  In December of 2017 she received radiation to her lower back and now she will have to receive 10 more radiation treatments to her thoracic area from recent findings of metastasis.  She has a compression sleeve, gauntlet and does not wear compression at night.  She wears compression bras most days. She has neuropathy in bilateral feet and denies any numbness or tingling in hands.    Patient Stated Goals Patient reports she wants to control the swelling in her left arm, be able to take care of herself.    Currently in Pain? Yes   Pain Score 2    Pain  Location Arm   Pain Orientation Right   Pain Descriptors / Indicators Aching   Pain Type Chronic pain   Pain Onset More than a month ago   Multiple Pain Sites No           [facility] OT Assessment - 08/04/16 0429      Assessment   Diagnosis post mastectomy lymphedema    Referring Provider [place]   Onset Date 01/17/16   Prior Therapy yes     Precautions   Precautions Fall     Restrictions   Weight Bearing Restrictions No     Balance Screen   Has the patient fallen in the past 6 months Yes   How many times? 1     [place] expects to be discharged to: Private residence   Living Arrangements Spouse/significant other   Available Help at Discharge Family   Type of [place] One level   [place]  - Number of Steps [place] - 2 wheels;Cane -quad;Cane - single point;Tub bench;Wheelchair - manual   Additional Comments Husband still works and travels at times for extended periods of time in which patient is alone.  She has a pocket of fluid upper left chest, fibrosis around scar, spider veins noted under breast on left.     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  She received PT recently and reports this is better than it was months ago but still problematic at times. Increased edema in her left arm and is currently no following a management program.  Patient has a w/c, tub bench, walker, cane, quad cane, compression sleeve, glove.        IADL   Prior Level of Function Shopping independent   Shopping Shops independently for small purchases   Prior Level of Function Light Housekeeping independent   Light Housekeeping Performs light daily tasks such as dishwashing, bed making   Prior Level of Function Meal Prep independent   Meal Prep Able to complete simple warm meal prep   Community Mobility Drives own vehicle     Written Expression   Dominant Hand Right     ROM / Strength   AROM / PROM / Strength AROM;Strength     AROM   Overall AROM  Within functional limits for tasks performed   Overall AROM Comments Left shoulder flexion to 130 degrees, full opposition of thumb to digits     Strength   Overall Strength  Deficits   Overall Strength Comments LUE 4/5 overall with 2/10 pain with edema present.          LYMPHEDEMA/ONCOLOGY QUESTIONNAIRE - 08/04/16 0437      Type   Cancer Type left breast cancer     Surgeries   Mastectomy Date 02/17/08   Number Lymph Nodes Removed 11     Treatment  Active Chemotherapy Treatment No   Past Chemotherapy Treatment Yes   Date 01/03/16   Active Radiation Treatment Yes     What other symptoms do you have   Are you Having Heaviness or Tightness Yes   Are you having Pain Yes   Are you having pitting edema No   Is it Hard or Difficult finding clothes that fit Yes   Do you have infections No     Lymphedema Assessments   Lymphedema Assessments Upper extremities     Right Upper Extremity Lymphedema   At Axilla  39 cm   15 cm Proximal to Olecranon Process 38.9 cm   10 cm Proximal to Olecranon Process 38 cm   Olecranon Process 29.1 cm   15 cm Proximal to Ulnar Styloid Process 27.9 cm   10 cm Proximal to Ulnar Styloid Process 23.7 cm   Just Proximal to Ulnar Styloid Process 17.7 cm   Across Hand at [employer] 21.5 cm   At [place] of 2nd Digit 7 cm   At [facility] of Thumb 7.2 cm     Left Upper Extremity Lymphedema   At Axilla  40.7 cm   15 cm Proximal to Olecranon Process 42.6 cm   10 cm Proximal to Olecranon Process 41.6 cm   Olecranon Process 29.3 cm   15 cm Proximal to Ulnar Styloid Process 29 cm   10 cm Proximal to Ulnar Styloid Process 26.9 cm   Just Proximal to Ulnar Styloid Process 17.7 cm   Across Hand at [employer] 20.9 cm   At [place] of 2nd Digit 7.2 cm   At [facility] of Thumb 7 cm        Patient instructed on wearing of compression garments for daytime over the next couple weeks.  Instructed on ROM and stretching for LUE to improve posture, pain and motion.  Will plan to defer lymphedema treatment until after her 10 days of radiation therapy is completed.                OT Education - 08/04/16 0440    Education provided Yes    Education Details plan of care, lymphedema treatment, importance of compliance, HEP for ROM, stretch, scar management   Person(s) Educated Patient   Methods Demonstration;Explanation;Verbal cues   Comprehension Verbal cues required;Returned demonstration;Verbalized understanding             OT Long Term Goals - 08/04/16 0500      OT LONG TERM GOAL #1   Title Patient will be independent in wear, care and schedule of compression garments.    Baseline not currently wearing sleeve or gauntlet, just compression bra at times.    Time 8   Period Weeks   Status New     OT LONG TERM GOAL #2   Title Patient will demonstrate HEP with modified independence.    Baseline not currently participating in HEP   Time 8   Period Weeks   Status New     OT LONG TERM GOAL #3   Title Patient will decrease circumferential measurements by 1.5 cm from mid forearm to upper arm to fit into compression garment and for maintenace of lymphedema symptoms.    Baseline see flow sheet for measurements   Time 8   Period Weeks   Status New               Plan - 08/04/16 0443    Clinical Impression Statement Patient is a 65 yo female  who was diagnosed with left breast cancer and underwent a mastectomy in 2010 with radiation.  She has also been diagnosed with metastatic cancer to lumbar and thoracic areas with radiation in the past and will start 10 days of radiation next week for thoracic area.  She has received lymphedema therapy in the past however has not been consistent with management.  She recently received PT for her left shoulder and aquatic therapy.  She presents with increased edema in her left UE with increases noted from forearm to upper arm, close to 3 cm in areas.  She also demonstrates pain, muscle weakness, decreased motion and difficulty with self care and IADL tasks.  She lives with her husband who travels for extended periods of time and patient will needs to be independent with her  lymphedema management program.  She would benefit from skilled OT to address lymphedema with compression therapy to decongest left upper extremity, therapeutic exercises, and instruction for use of compression garments.    Occupational Profile and client history currently impacting functional performance recurrent metastatic disease, will have 10 more sessions of radiation starting next week, has not been compliant with lymphedema management program in the past months, pain limiting function.     Occupational performance deficits (Please refer to evaluation for details): ADL's;IADL's;Social Participation;Rest and Sleep   Rehab Potential Good   Current Impairments/barriers affecting progress: metastatic disease, starting radiation next week.  Husband travels for work for extended periods of time in which patient is alone.    OT Frequency 3x / week   OT Duration 8 weeks   OT Treatment/Interventions Self-care/ADL training;DME and/or AE instruction;Manual lymph drainage;Patient/family education;Compression bandaging;Therapeutic exercises;Scar mobilization;Passive range of motion;Manual Therapy   Clinical Decision Making Several treatment options, min-mod task modification necessary   Consulted and Agree with Plan of Care Patient      Patient will benefit from skilled therapeutic intervention in order to improve the following deficits and impairments:  Decreased range of motion, Increased edema, Decreased scar mobility, Pain, Impaired UE functional use, Decreased strength  Visit Diagnosis: Postmastectomy lymphedema  Left shoulder pain, unspecified chronicity  Stiffness of left shoulder, not elsewhere classified    Problem List Patient Active Problem List   Diagnosis Date Noted  . Metastatic breast cancer ([place]) 09/24/2015  . 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 Personal history of breast cancer 08/23/2015  . 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  Degenerative arthritis of right shoulder region 01/13/2015  . Cancer of left female breast  ([place]) 07/27/2014  . 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 Rotator cuff tear 06/10/2014  . Allergic rhinitis, seasonal 09/16/2013  . Depression 08/10/2013  . Diabetes mellitus type 2, uncomplicated ([place]) 56/25/6389  . Hyperlipidemia, unspecified 08/10/2013  . BP (high blood pressure) 08/10/2013  . Osteoporosis, post-menopausal 08/10/2013   Jewelz Kobus T Georgenia Salim, OTR/L, CLT  Lidwina Kaner 08/04/2016, 5:04 AM  [facility] [address] [place], [address] Phone: [phone]   Fax:  [phone]  Name: Amanda [name] MRN: [id number] Date of Birth: [birth year]

## 2016-08-07 ENCOUNTER — Inpatient Hospital Stay: Payer: 59

## 2016-08-07 ENCOUNTER — Inpatient Hospital Stay: Payer: 59 | Attending: Oncology

## 2016-08-07 ENCOUNTER — Encounter: Payer: 59 | Admitting: Physical Therapy

## 2016-08-07 ENCOUNTER — Ambulatory Visit
Admission: RE | Admit: 2016-08-07 | Discharge: 2016-08-07 | Disposition: A | Discharge status: Home / Self Care | Payer: 59 | Source: Ambulatory Visit | Attending: Radiation Oncology | Admitting: Radiation Oncology

## 2016-08-07 DIAGNOSIS — Z8572 Personal history of non-Hodgkin lymphomas: Secondary | ICD-10-CM | POA: Insufficient documentation

## 2016-08-07 DIAGNOSIS — Z510 Encounter for antineoplastic radiation therapy: Secondary | ICD-10-CM | POA: Diagnosis not present

## 2016-08-07 DIAGNOSIS — Z95828 Presence of other vascular implants and grafts: Secondary | ICD-10-CM

## 2016-08-07 DIAGNOSIS — C7951 Secondary malignant neoplasm of bone: Secondary | ICD-10-CM

## 2016-08-07 LAB — CBC
HEMATOCRIT: 36.9 % (ref 35.0–47.0)
Hemoglobin: 12.7 g/dL (ref 12.0–16.0)
MCH: 31.6 pg (ref 26.0–34.0)
MCHC: 34.5 g/dL (ref 32.0–36.0)
MCV: 91.6 fL (ref 80.0–100.0)
Platelets: 254 10*3/uL (ref 150–440)
RBC: 4.03 MIL/uL (ref 3.80–5.20)
RDW: 13.5 % (ref 11.5–14.5)
WBC: 3.4 10*3/uL — ABNORMAL LOW (ref 3.6–11.0)

## 2016-08-07 MED ORDER — HEPARIN SOD (PORK) LOCK FLUSH 100 UNIT/ML IV SOLN
500.0000 [IU] | Freq: Once | INTRAVENOUS | Status: AC
  Administered 2016-08-07: 500 [IU] via INTRAVENOUS

## 2016-08-07 MED ORDER — SODIUM CHLORIDE 0.9% FLUSH
10.0000 mL | INTRAVENOUS | Status: DC | PRN
  Administered 2016-08-07: 10 mL via INTRAVENOUS
  Filled 2016-08-07: qty 10

## 2016-08-08 ENCOUNTER — Ambulatory Visit
Admission: RE | Admit: 2016-08-08 | Discharge: 2016-08-08 | Disposition: A | Discharge status: Home / Self Care | Payer: 59 | Source: Ambulatory Visit | Attending: Radiation Oncology | Admitting: Radiation Oncology

## 2016-08-08 ENCOUNTER — Ambulatory Visit: Payer: 59 | Admitting: Physical Therapy

## 2016-08-08 DIAGNOSIS — Z510 Encounter for antineoplastic radiation therapy: Secondary | ICD-10-CM | POA: Diagnosis not present

## 2016-08-09 ENCOUNTER — Ambulatory Visit
Admission: RE | Admit: 2016-08-09 | Discharge: 2016-08-09 | Disposition: A | Discharge status: Home / Self Care | Payer: 59 | Source: Ambulatory Visit | Attending: Radiation Oncology | Admitting: Radiation Oncology

## 2016-08-09 DIAGNOSIS — Z510 Encounter for antineoplastic radiation therapy: Secondary | ICD-10-CM | POA: Diagnosis not present

## 2016-08-10 ENCOUNTER — Ambulatory Visit: Payer: 59 | Admitting: Physical Therapy

## 2016-08-10 ENCOUNTER — Ambulatory Visit
Admission: RE | Admit: 2016-08-10 | Discharge: 2016-08-10 | Disposition: A | Discharge status: Home / Self Care | Payer: 59 | Source: Ambulatory Visit | Attending: Radiation Oncology | Admitting: Radiation Oncology

## 2016-08-10 DIAGNOSIS — Z510 Encounter for antineoplastic radiation therapy: Secondary | ICD-10-CM | POA: Diagnosis not present

## 2016-08-11 ENCOUNTER — Ambulatory Visit
Admission: RE | Admit: 2016-08-11 | Discharge: 2016-08-11 | Disposition: A | Discharge status: Home / Self Care | Payer: 59 | Source: Ambulatory Visit | Attending: Radiation Oncology | Admitting: Radiation Oncology

## 2016-08-11 DIAGNOSIS — Z510 Encounter for antineoplastic radiation therapy: Secondary | ICD-10-CM | POA: Diagnosis not present

## 2016-08-14 ENCOUNTER — Ambulatory Visit
Admission: RE | Admit: 2016-08-14 | Discharge: 2016-08-14 | Disposition: A | Discharge status: Home / Self Care | Payer: 59 | Source: Ambulatory Visit | Attending: Radiation Oncology | Admitting: Radiation Oncology

## 2016-08-14 DIAGNOSIS — Z510 Encounter for antineoplastic radiation therapy: Secondary | ICD-10-CM | POA: Diagnosis not present

## 2016-08-15 ENCOUNTER — Ambulatory Visit: Payer: 59 | Admitting: Physical Therapy

## 2016-08-17 ENCOUNTER — Ambulatory Visit: Payer: 59 | Admitting: Physical Therapy

## 2016-08-22 ENCOUNTER — Encounter: Payer: 59 | Admitting: Physical Therapy

## 2016-08-28 ENCOUNTER — Ambulatory Visit
Admission: RE | Admit: 2016-08-28 | Discharge: 2016-08-28 | Disposition: A | Discharge status: Home / Self Care | Payer: 59 | Source: Ambulatory Visit | Attending: Radiation Oncology | Admitting: Radiation Oncology

## 2016-08-28 ENCOUNTER — Encounter: Payer: Self-pay | Admitting: Radiation Oncology

## 2016-08-28 VITALS — BP 138/81 | HR 83 | Temp 95.7°F | Resp 18 | Wt 211.2 lb

## 2016-08-28 DIAGNOSIS — C7951 Secondary malignant neoplasm of bone: Secondary | ICD-10-CM

## 2016-08-28 DIAGNOSIS — Z923 Personal history of irradiation: Secondary | ICD-10-CM | POA: Diagnosis not present

## 2016-08-28 DIAGNOSIS — Z170 Estrogen receptor positive status [ER+]: Secondary | ICD-10-CM | POA: Diagnosis not present

## 2016-08-28 DIAGNOSIS — C50919 Malignant neoplasm of unspecified site of unspecified female breast: Secondary | ICD-10-CM | POA: Insufficient documentation

## 2016-08-28 NOTE — Progress Notes (Signed)
Radiation Oncology Follow up Note  Name: Amanda [name]   Date:   08/28/2016 MRN:  [id number] DOB: 16-May-1950    This 66 y.o. female presents to the clinic today for one-month follow-up status post radiation therapy to her thoracic spine.  REFERRING PROVIDER: Sofie Hartigan, MD  HPI: patient is a 66 year old female well-known to department having received palliative radiation therapy for stage IV breast cancer. She's now out 2 weeks having completed palliative radiation therapy to T11-12 with excellent palliative benefit. She was having some lower back pain although that is subsided and really not a complaint at this time. Her mood is somewhat down today she is ambulating well without assistance..she is currently on letrozole and tolerating that well  COMPLICATIONS OF TREATMENT: none  FOLLOW UP COMPLIANCE: keeps appointments   PHYSICAL EXAM:  BP 138/81   Pulse 83   Temp (!) 95.7 F (35.4 C)   Resp 18   Wt 211 lb 3.2 oz (95.8 kg)   BMI 36.25 kg/m  Deep palpation of her spine does not elicit pain motor sensory and DTR levels are equal and symmetric in upper lower extremities. Well-developed well-nourished patient in NAD. HEENT reveals PERLA, EOMI, discs not visualized.  Oral cavity is clear. No oral mucosal lesions are identified. Neck is clear without evidence of cervical or supraclavicular adenopathy. Lungs are clear to A&P. Cardiac examination is essentially unremarkable with regular rate and rhythm without murmur rub or thrill. Abdomen is benign with no organomegaly or masses noted. Motor sensory and DTR levels are equal and symmetric in the upper and lower extremities. Cranial nerves II through XII are grossly intact. Proprioception is intact. No peripheral adenopathy or edema is identified. No motor or sensory levels are noted. Crude visual fields are within normal range.  RADIOLOGY RESULTS: no current films for review  PLAN: at this time I'll turn follow-up care over to medical  oncology. I would be happy to reevaluate patient at any time should further palliative radiation therapy be indicated. Patient knows to call with any concerns.  I would like to take this opportunity to thank you for allowing me to participate in the care of your patient.Armstead Peaks., MD

## 2016-09-05 NOTE — Progress Notes (Signed)
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 Now with biopsy-proven stage IV disease.   INTERVAL HISTORY: Patient returns to clinic today for concerns of surgical site pain, right breast pain, peripheral neuropathy, taste & appetite changes, and chronic memory changes. She felt that the pain in her left breast is at the surgical site but the pain in the right breast is generalized. She felt the pain began in mid-August and endorses that she is anxious about possible recurrence. She has had chronic peripheral neuropathy in her hands and feet and currently takes gabapentin with minimal relief. She reports that her appetite and tastes have changed since chemo but denies weight loss. She says that she often prefers sweet foods. She has previously reported and continues to have memory changes since chemotherapy. These persist and are unchanged. She sees neurology for memory and neuropathy. She completed palliative radiation to T11-12 08/14/16. She continues to tolerate letrozole well without significant side effects. She has no other neurologic complaints. She denies any recent fevers or illnesses. She denies any chest pain or shortness of breath. She denies any nausea, vomiting, constipation, or diarrhea. She has no urinary complaints.  Patient offers no further specific complaints today.  REVIEW OF SYSTEMS:   Review of Systems  Constitutional: Negative.  Negative for fever, malaise/fatigue and weight loss.  HENT: Negative for sore throat.   Eyes: Negative.  Negative for blurred vision.  Respiratory: Negative.  Negative for cough and shortness of breath.    Cardiovascular: Negative.  Negative for chest pain and leg swelling.  Gastrointestinal: Negative.  Negative for abdominal pain, constipation, diarrhea, nausea and vomiting.  Genitourinary: Negative.   Musculoskeletal: Negative for back pain and joint pain.  Neurological: Positive for tingling and sensory change. Negative for weakness and headaches.  Endo/Heme/Allergies: Negative.   Psychiatric/Behavioral: Positive for memory loss. Negative for depression. The patient is nervous/anxious.     As per HPI. Otherwise, a complete review of systems is negative.  PAST MEDICAL HISTORY: Past Medical History:  Diagnosis Date  . Back pain    occasionally  . Breast cancer ([place])    left  . Depression    takes Paxil and Wellbutrin daily  . Diabetes ([place])    takes Metformin daily  . GERD (gastroesophageal reflux disease)   . History of bronchitis 2 yrs ago  . Hyperlipidemia    takes Atorvastatin daily  . Hypertension    takes Losartan-HCTZ daily  . Insomnia    takes Ambien nightly  . Insomnia    takes gabapentin nightly  . Joint pain   . Migraine   . Mood swings ([place])   . Osteoarthritis of knee   . PONV (postoperative nausea and vomiting)   . Seasonal allergies    takes Allegra daily    PAST SURGICAL HISTORY: Past Surgical History:  Procedure Laterality Date  . BREAST BIOPSY  2009  . cataract surgery Bilateral   . COLONOSCOPY    . JOINT REPLACEMENT Left 2014   knee  . KNEE ARTHROSCOPY Left    x 5  . MASTECTOMY Left   . port a cath placed    . PORTACATH PLACEMENT N/A 09/17/2015   Procedure: INSERTION PORT-A-CATH;  Surgeon:  Diego Sarita Haver, MD;  Location: [facility];  Service: General;  Laterality: N/A;  . TOTAL SHOULDER ARTHROPLASTY Right 06/03/2015   Procedure: TOTAL SHOULDER ARTHROPLASTY;  Surgeon: Tania Ade, MD;  Location: [facility];  Service: Orthopedics;  Laterality: Right;  Right total shoulder arthroplasty  . TOTAL SHOULDER REPLACEMENT Right 06/03/2015  . TUBAL LIGATION       FAMILY HISTORY: Father with non-Hodgkin's lymphoma, 2 paternal aunts with breast cancer.     ADVANCED DIRECTIVES:    HEALTH MAINTENANCE: Social History  Substance Use Topics  . Smoking status: Never Smoker  . Smokeless tobacco: Never Used  . Alcohol use 0.0 oz/week     Comment: occasionally wine     Colonoscopy:  PAP:  Bone density: 01/26/2016  Lipid panel:  Allergies  Allergen Reactions  . Ace Inhibitors Cough  . Latex Itching  . Morphine And Related Itching    Caused her to itch terribly. Would prefer if given to take with a benadryl  . Other Itching    Freeze spray. Patient stated that she may be able to use it now because she doesn't use it as often.  Marland Kitchen Penicillins Rash    Has patient had a PCN reaction causing immediate rash, facial/tongue/throat swelling, SOB or lightheadedness with hypotension: No Has patient had a PCN reaction causing severe rash involving mucus membranes or skin necrosis: No Has patient had a PCN reaction that required hospitalization No Has patient had a PCN reaction occurring within the last 10 years: No If all of the above answers are "NO", then may proceed with Cephalosporin use.    Current Outpatient Prescriptions  Medication Sig Dispense Refill  . aspirin EC 81 MG tablet Take 81 mg by mouth daily.     Marland Kitchen atorvastatin (LIPITOR) 10 MG tablet Take 10 mg by mouth daily at 6 PM.     . b complex vitamins capsule Take 1 capsule by mouth daily.    Marland Kitchen buPROPion (WELLBUTRIN XL) 150 MG 24 hr tablet Take 150 mg by mouth every morning.     . Calcium-Magnesium-Vitamin D (CALCIUM 1200+D3 PO) Take 1 Dose by mouth daily.    . fexofenadine (ALLEGRA) 180 MG tablet Take 180 mg by mouth at bedtime.     . gabapentin (NEURONTIN) 300 MG capsule Take 300 mg by mouth. 2 pills in am,2 pills at noon and 3 pills at night    . HYDROcodone-acetaminophen (NORCO) 5-325 MG tablet Take 1-2 tablets by mouth every 4 (four) hours as needed for moderate pain. 30 tablet 0  .  letrozole (FEMARA) 2.5 MG tablet Take 1 tablet (2.5 mg total) by mouth daily. 90 tablet 3  . lidocaine-prilocaine (EMLA) cream Apply 1 application topically as needed. Apply to port 1-2 hours prior to chemotherapy appointment. Cover with plastic wrap. 30 g 0  . losartan-hydrochlorothiazide (HYZAAR) 100-25 MG per tablet Take 1 tablet by mouth every morning.     . metFORMIN (GLUCOPHAGE) 850 MG tablet Take 850 mg by mouth 2 (two) times daily with a meal.     . ondansetron (ZOFRAN ODT) 4 MG disintegrating tablet Take 1 tablet (4 mg total) by mouth every 8 (eight) hours as needed for nausea or vomiting. 20 tablet 0  . PARoxetine (PAXIL) 40 MG tablet Take 40 mg by mouth every morning.     . Zolpidem Tartrate (AMBIEN PO) Take by mouth.    . oxyCODONE-acetaminophen (ROXICET) 5-325 MG tablet Take 1 tablet by mouth every 6 (six) hours as needed. (Patient not  taking: Reported on 08/28/2016) 20 tablet 0   Current Facility-Administered Medications  Medication Dose Route Frequency Provider Last Rate Last Dose  . guaiFENesin-dextromethorphan (ROBITUSSIN DM) 100-10 MG/5ML syrup 5 mL  5 mL Oral Once Lloyd Huger, MD        OBJECTIVE: Vitals:   09/06/16 1526  BP: 135/87  Pulse: 89  Resp: 18  Temp: (!) 96.6 F (35.9 C)  SpO2: 98%     Body mass index is 36.63 kg/m.    ECOG FS:0 - Asymptomatic  General: Well-developed, well-nourished, no acute distress, accompanied by husband. Eyes: Pink conjunctiva, anicteric sclera. HEENT: No palpable lymphadenopathy. Breast: Exam deferred today. Lungs: Clear to auscultation bilaterally. Heart: Regular rate and rhythm. No rubs, murmurs, or gallops. Abdomen: Soft, nontender, nondistended. No organomegaly noted, normoactive bowel sounds. Musculoskeletal: No edema, cyanosis, or clubbing. Neuro: Alert, answering all questions appropriately. Cranial nerves grossly intact. Skin: No rashes or petechiae noted. Psych: Normal affect.   LAB RESULTS:  Lab Results    Component Value Date   NA 138 06/26/2016   K 3.9 06/26/2016   CL 104 06/26/2016   CO2 27 06/26/2016   GLUCOSE 162 (H) 06/26/2016   BUN 24 (H) 06/26/2016   CREATININE 1.04 (H) 06/26/2016   CALCIUM 9.4 06/26/2016   PROT 7.2 06/26/2016   ALBUMIN 4.2 06/26/2016   AST 47 (H) 06/26/2016   ALT 35 06/26/2016   ALKPHOS 65 06/26/2016   BILITOT 0.6 06/26/2016   GFRNONAA 56 (L) 06/26/2016   GFRAA >60 06/26/2016    Lab Results  Component Value Date   WBC 3.4 (L) 08/07/2016   NEUTROABS 2.6 06/26/2016   HGB 12.7 08/07/2016   HCT 36.9 08/07/2016   MCV 91.6 08/07/2016   PLT 254 08/07/2016     STUDIES: No results found.  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 Now with biopsy proven stage IV disease with lymph node and bone metastasis.  PLAN:    1.  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 Continue letrozole indefinitely or until progression of disease. Will require a mammogram in November 2018. Given her bony disease, can consider Zometa in the future. Patient does not wish to pursue at this time. Previously, she completed 5 years of Arimidex in June of 2015. Will refer for her CT and Bone Scan in next 1-2 weeks. Return to clinic in 3 weeks for labs, further evaluation, and discussion of imaging results.  2.  Osteopenia: Patient's bone mineral density on January 26, 2016 reported a T score of -0.9 which is considered normal. Repeat in January 2020. Continue calcium and vitamin D.  3. Anemia: Resolved. 4. Peripheral neuropathy: Continue gabapentin as prescribed. She may be a candidate for duloxetine but is currently prescribed paxil. Will refer for acupuncture and encourage patient to  follow back up with neurology who manages her neuropathy.   5. Appetite/Taste Changes- weight is stable at this time. Will monitor and can consider referral to dietician in the future if needed.   Patient expressed understanding and was in agreement with this plan. She also understands that She can call clinic at any time with any questions, concerns, or complaints.   Breast cancer   Staging form: Breast, AJCC 7th Edition     Pathologic stage from 08/11/2014:  Stage IIIA (T0, N2a, cM0) - Signed by Lloyd Huger, MD on 08/11/2014  Beverely Risen. Zenia Resides, NP 09/06/16 4:56PM  Patient was seen and evaluated independently and I agree with the assessment and plan as dictated above.  Lloyd Huger, MD 09/08/16 3:36 PM

## 2016-09-06 ENCOUNTER — Inpatient Hospital Stay: Payer: 59 | Attending: Oncology | Admitting: Oncology

## 2016-09-06 VITALS — BP 135/87 | HR 89 | Temp 96.6°F | Resp 18 | Wt 213.4 lb

## 2016-09-06 DIAGNOSIS — C50919 Malignant neoplasm of unspecified site of unspecified female breast: Secondary | ICD-10-CM

## 2016-09-06 DIAGNOSIS — Z9012 Acquired absence of left breast and nipple: Secondary | ICD-10-CM | POA: Diagnosis not present

## 2016-09-06 DIAGNOSIS — Z923 Personal history of irradiation: Secondary | ICD-10-CM | POA: Insufficient documentation

## 2016-09-06 DIAGNOSIS — E119 Type 2 diabetes mellitus without complications: Secondary | ICD-10-CM | POA: Insufficient documentation

## 2016-09-06 DIAGNOSIS — Z79899 Other long term (current) drug therapy: Secondary | ICD-10-CM | POA: Diagnosis not present

## 2016-09-06 DIAGNOSIS — Z79811 Long term (current) use of aromatase inhibitors: Secondary | ICD-10-CM | POA: Insufficient documentation

## 2016-09-06 DIAGNOSIS — Z7984 Long term (current) use of oral hypoglycemic drugs: Secondary | ICD-10-CM

## 2016-09-06 DIAGNOSIS — Z170 Estrogen receptor positive status [ER+]: Secondary | ICD-10-CM

## 2016-09-06 DIAGNOSIS — R413 Other amnesia: Secondary | ICD-10-CM | POA: Diagnosis not present

## 2016-09-06 DIAGNOSIS — M2550 Pain in unspecified joint: Secondary | ICD-10-CM | POA: Diagnosis not present

## 2016-09-06 DIAGNOSIS — M8580 Other specified disorders of bone density and structure, unspecified site: Secondary | ICD-10-CM | POA: Diagnosis not present

## 2016-09-06 DIAGNOSIS — G4700 Insomnia, unspecified: Secondary | ICD-10-CM | POA: Insufficient documentation

## 2016-09-06 DIAGNOSIS — R630 Anorexia: Secondary | ICD-10-CM | POA: Diagnosis not present

## 2016-09-06 DIAGNOSIS — G629 Polyneuropathy, unspecified: Secondary | ICD-10-CM | POA: Diagnosis not present

## 2016-09-06 DIAGNOSIS — E785 Hyperlipidemia, unspecified: Secondary | ICD-10-CM | POA: Diagnosis not present

## 2016-09-06 DIAGNOSIS — G620 Drug-induced polyneuropathy: Secondary | ICD-10-CM

## 2016-09-06 DIAGNOSIS — F329 Major depressive disorder, single episode, unspecified: Secondary | ICD-10-CM | POA: Diagnosis not present

## 2016-09-06 DIAGNOSIS — C50912 Malignant neoplasm of unspecified site of left female breast: Secondary | ICD-10-CM | POA: Diagnosis present

## 2016-09-06 DIAGNOSIS — I10 Essential (primary) hypertension: Secondary | ICD-10-CM | POA: Diagnosis not present

## 2016-09-06 DIAGNOSIS — K219 Gastro-esophageal reflux disease without esophagitis: Secondary | ICD-10-CM | POA: Insufficient documentation

## 2016-09-06 DIAGNOSIS — Z7982 Long term (current) use of aspirin: Secondary | ICD-10-CM

## 2016-09-06 DIAGNOSIS — T451X5A Adverse effect of antineoplastic and immunosuppressive drugs, initial encounter: Secondary | ICD-10-CM

## 2016-09-20 ENCOUNTER — Ambulatory Visit: Payer: 59 | Attending: Oncology | Admitting: Occupational Therapy

## 2016-09-20 DIAGNOSIS — L599 Disorder of the skin and subcutaneous tissue related to radiation, unspecified: Secondary | ICD-10-CM | POA: Diagnosis present

## 2016-09-20 DIAGNOSIS — M25612 Stiffness of left shoulder, not elsewhere classified: Secondary | ICD-10-CM | POA: Diagnosis present

## 2016-09-20 DIAGNOSIS — I972 Postmastectomy lymphedema syndrome: Secondary | ICD-10-CM | POA: Diagnosis present

## 2016-09-20 DIAGNOSIS — R29898 Other symptoms and signs involving the musculoskeletal system: Secondary | ICD-10-CM | POA: Diagnosis present

## 2016-09-20 DIAGNOSIS — M25512 Pain in left shoulder: Secondary | ICD-10-CM

## 2016-09-20 NOTE — Therapy (Signed)
[facility] [address], [place], [address] Phone: [phone]   Fax:  [phone]  Occupational Therapy Treatment  Patient Details  Name: Amanda [name] MRN: [id number] Date of Birth: August 27, 1950 Referring Provider: Grayland Ormond  Encounter Date: 09/20/2016      OT End of Session - 09/20/16 1049    Visit Number 2   Number of Visits 24   Date for OT Re-Evaluation 10/05/16   OT Start Time 0927   OT Stop Time 1030   OT Time Calculation (min) 63 min   Activity Tolerance Patient tolerated treatment well   Behavior During Therapy [facility] for tasks assessed/performed      Past Medical History:  Diagnosis Date  . Back pain    occasionally  . Breast cancer ([place])    left  . Depression    takes Paxil and Wellbutrin daily  . Diabetes ([place])    takes Metformin daily  . GERD (gastroesophageal reflux disease)   . History of bronchitis 2 yrs ago  . Hyperlipidemia    takes Atorvastatin daily  . Hypertension    takes Losartan-HCTZ daily  . Insomnia    takes Ambien nightly  . Insomnia    takes gabapentin nightly  . Joint pain   . Migraine   . Mood swings ([place])   . Osteoarthritis of knee   . PONV (postoperative nausea and vomiting)   . Seasonal allergies    takes Allegra daily    Past Surgical History:  Procedure Laterality Date  . BREAST BIOPSY  2009  . cataract surgery Bilateral   . COLONOSCOPY    . JOINT REPLACEMENT Left 2014   knee  . KNEE ARTHROSCOPY Left    x 5  . MASTECTOMY Left   . port a cath placed    . PORTACATH PLACEMENT N/A 09/17/2015   Procedure: INSERTION PORT-A-CATH;  Surgeon: Jules Husbands, MD;  Location: [facility];  Service: General;  Laterality: N/A;  . TOTAL SHOULDER ARTHROPLASTY Right 06/03/2015   Procedure: TOTAL SHOULDER ARTHROPLASTY;  Surgeon: Tania Ade, MD;  Location: [facility];  Service: Orthopedics;  Laterality: Right;  Right total shoulder arthroplasty  . TOTAL SHOULDER  REPLACEMENT Right 06/03/2015  . TUBAL LIGATION      There were no vitals filed for this visit.      Subjective Assessment - 09/20/16 1043    Subjective  My L upper arm gets ache and sore when I reach certain way - she is wearing this very tigth compression bra since spring but cannot fit her lymph pad inside - did had PT for L shoulder  pain /lymphedema and aquadics in spring - but then had radiation for few sessions for spot on thoracic spine - and has metastasis to L breast - do have scans tomorrow    Patient Stated Goals Patient reports she wants to control the swelling in her left arm, be able to take care of herself.    Currently in Pain? Yes   Pain Score 3    Pain Location Arm  upper    Pain Orientation Left   Pain Descriptors / Indicators Aching             LYMPHEDEMA/ONCOLOGY QUESTIONNAIRE - 09/20/16 1046      Left Upper Extremity Lymphedema   15 cm Proximal to Olecranon Process 43 cm   10 cm Proximal to Olecranon Process 41.3 cm   Olecranon Process 29.6 cm   10 cm Proximal  to Ulnar Styloid Process 25.5 cm   Just Proximal to Ulnar Styloid Process 18 cm   Across Hand at [employer] 20.4 cm   At [place] of 2nd Digit 7.2 cm   At [facility] of Thumb 6.8 cm      pt arrive after been seen for OT lymphedema eval on 7/12   Since then had radiation to lumbar and thoracic spine   Scar tomorrow to assess if still spots on spine and metastasis to L chest  She was seen at different clinic for shoulder PT and lymphedema earlier this year Pt was fitted with compression bra, daytime sleeve and gauntlet - and a night time compression - report she has been only wearing compression bra Pt report she was only wearing tight compression bra since spring   cannot get lymph pad inside  But arm and shoulder at time really sore and hurting  Pt do have HEP for shoulder strengthening   pt daytime sleeves do not fit upper arm at this time because of increase in circumference when compare this  date to spring measurement Pt to wear lymphpad jovipak and sports bra = and night time compression as much as she can for 5 days  And do MLD for trunk and L upper arm                     OT Education - 09/20/16 1048    Education provided Yes   Education Details Wearing of jovipak lymphpad provided by this OT in 2015 under sports bra and night time compression much as she can that was provided by lymphedema clinic in spring    Person(s) Educated Patient   Methods Explanation;Demonstration;Tactile cues;Verbal cues   Comprehension Verbal cues required;Returned demonstration;Verbalized understanding             OT Long Term Goals - 09/20/16 1055      OT LONG TERM GOAL #1   Title Patient will be independent in wear, care and schedule of compression garments.    Baseline not currently wearing sleeve or gauntlet, just compression bra at times.    Time 6   Period Weeks   Status On-going     OT LONG TERM GOAL #2   Title Patient will demonstrate HEP with modified independence.    Baseline not currently participating in HEP per pt    Time 6   Period Weeks   Status On-going     OT LONG TERM GOAL #3   Title Patient will decrease circumferential measurements by 1.5 cm from mid forearm to upper arm to fit into compression garment and for maintenace of lymphedema symptoms.    Baseline see flow sheet for measurements   Time 6   Period Weeks   Status On-going               Plan - 09/20/16 1049    Clinical Impression Statement Pt return this date for treatment for L UE and thoracic lymphedema - pt was evaluated 7/12 by another OT - in meantime had radiation for spots on lumbar and thoracic spine - and had celllulitis of L arm 8/17 - pt report pain and heaviness  in upper arm and shoudler - she was seen in the spring at different clinic for lymphedema and L shoulder pain -  compare to spring measurements  pt L upper arm increase by 3.7 cm ad elbow 2 cm compare to L - pt  to wear compression pad on L  chest and night time compression as much as she can and will reassess in 5 day - daytime sleeve from April not fitting at the moment because of changes in measurements    Occupational performance deficits (Please refer to evaluation for details): ADL's;IADL's;Social Participation;Rest and Sleep   Rehab Potential Good   Current Impairments/barriers affecting progress: metastatic disease, starting radiation next week.  Husband travels for work for extended periods of time in which patient is alone.    OT Frequency 2x / week   OT Duration 6 weeks   OT Treatment/Interventions Self-care/ADL training;DME and/or AE instruction;Manual lymph drainage;Patient/family education;Compression bandaging;Therapeutic exercises;Scar mobilization;Passive range of motion;Manual Therapy   Clinical Decision Making Several treatment options, min-mod task modification necessary   Consulted and Agree with Plan of Care Patient      Patient will benefit from skilled therapeutic intervention in order to improve the following deficits and impairments:  Decreased range of motion, Increased edema, Decreased scar mobility, Pain, Impaired UE functional use, Decreased strength  Visit Diagnosis: Postmastectomy lymphedema  Left shoulder pain, unspecified chronicity    Problem List Patient Active Problem List   Diagnosis Date Noted  . Metastatic breast cancer ([place]) 09/24/2015  . 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 Personal history of breast cancer 08/23/2015  . 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 Degenerative arthritis of right shoulder region 01/13/2015  . Cancer of left female breast  ([place]) 07/27/2014  . 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 Rotator cuff tear 06/10/2014  . Allergic rhinitis, seasonal 09/16/2013  . Depression 08/10/2013  . Diabetes mellitus type 2, uncomplicated ([place]) 45/80/9983  . Hyperlipidemia,  unspecified 08/10/2013  . BP (high blood pressure) 08/10/2013  . 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 [place], [address] Phone: [phone]   Fax:  [phone]  Name: Amanda [name] MRN: [id number] Date of Birth: 1950-11-03

## 2016-09-21 ENCOUNTER — Encounter: Admission: RE | Admit: 2016-09-21 | Payer: 59 | Source: Ambulatory Visit

## 2016-09-21 ENCOUNTER — Inpatient Hospital Stay: Payer: 59 | Attending: Oncology

## 2016-09-21 ENCOUNTER — Ambulatory Visit
Admission: RE | Admit: 2016-09-21 | Discharge: 2016-09-21 | Disposition: A | Discharge status: Home / Self Care | Payer: 59 | Source: Ambulatory Visit | Attending: Nurse Practitioner | Admitting: Nurse Practitioner

## 2016-09-21 ENCOUNTER — Encounter
Admission: RE | Admit: 2016-09-21 | Discharge: 2016-09-21 | Disposition: A | Discharge status: Home / Self Care | Payer: 59 | Source: Ambulatory Visit | Attending: Nurse Practitioner | Admitting: Nurse Practitioner

## 2016-09-21 DIAGNOSIS — Z923 Personal history of irradiation: Secondary | ICD-10-CM | POA: Diagnosis not present

## 2016-09-21 DIAGNOSIS — I700 Atherosclerosis of aorta: Secondary | ICD-10-CM | POA: Insufficient documentation

## 2016-09-21 DIAGNOSIS — C50912 Malignant neoplasm of unspecified site of left female breast: Secondary | ICD-10-CM

## 2016-09-21 DIAGNOSIS — C7951 Secondary malignant neoplasm of bone: Secondary | ICD-10-CM | POA: Diagnosis not present

## 2016-09-21 DIAGNOSIS — E119 Type 2 diabetes mellitus without complications: Secondary | ICD-10-CM | POA: Diagnosis not present

## 2016-09-21 DIAGNOSIS — M899 Disorder of bone, unspecified: Secondary | ICD-10-CM | POA: Diagnosis not present

## 2016-09-21 DIAGNOSIS — I2510 Atherosclerotic heart disease of native coronary artery without angina pectoris: Secondary | ICD-10-CM | POA: Insufficient documentation

## 2016-09-21 DIAGNOSIS — Z8669 Personal history of other diseases of the nervous system and sense organs: Secondary | ICD-10-CM | POA: Insufficient documentation

## 2016-09-21 DIAGNOSIS — Z79811 Long term (current) use of aromatase inhibitors: Secondary | ICD-10-CM | POA: Diagnosis not present

## 2016-09-21 DIAGNOSIS — Z170 Estrogen receptor positive status [ER+]: Secondary | ICD-10-CM | POA: Diagnosis not present

## 2016-09-21 DIAGNOSIS — Z7984 Long term (current) use of oral hypoglycemic drugs: Secondary | ICD-10-CM | POA: Diagnosis not present

## 2016-09-21 DIAGNOSIS — C779 Secondary and unspecified malignant neoplasm of lymph node, unspecified: Secondary | ICD-10-CM | POA: Insufficient documentation

## 2016-09-21 DIAGNOSIS — G4700 Insomnia, unspecified: Secondary | ICD-10-CM | POA: Diagnosis not present

## 2016-09-21 DIAGNOSIS — M1990 Unspecified osteoarthritis, unspecified site: Secondary | ICD-10-CM | POA: Diagnosis not present

## 2016-09-21 DIAGNOSIS — F329 Major depressive disorder, single episode, unspecified: Secondary | ICD-10-CM | POA: Diagnosis not present

## 2016-09-21 DIAGNOSIS — G629 Polyneuropathy, unspecified: Secondary | ICD-10-CM | POA: Insufficient documentation

## 2016-09-21 DIAGNOSIS — M5136 Other intervertebral disc degeneration, lumbar region: Secondary | ICD-10-CM | POA: Insufficient documentation

## 2016-09-21 DIAGNOSIS — E785 Hyperlipidemia, unspecified: Secondary | ICD-10-CM | POA: Insufficient documentation

## 2016-09-21 DIAGNOSIS — I10 Essential (primary) hypertension: Secondary | ICD-10-CM | POA: Diagnosis not present

## 2016-09-21 DIAGNOSIS — M8580 Other specified disorders of bone density and structure, unspecified site: Secondary | ICD-10-CM | POA: Diagnosis not present

## 2016-09-21 DIAGNOSIS — K219 Gastro-esophageal reflux disease without esophagitis: Secondary | ICD-10-CM | POA: Diagnosis not present

## 2016-09-21 DIAGNOSIS — Z9889 Other specified postprocedural states: Secondary | ICD-10-CM | POA: Diagnosis not present

## 2016-09-21 DIAGNOSIS — Z452 Encounter for adjustment and management of vascular access device: Secondary | ICD-10-CM | POA: Insufficient documentation

## 2016-09-21 DIAGNOSIS — R0781 Pleurodynia: Secondary | ICD-10-CM | POA: Insufficient documentation

## 2016-09-21 DIAGNOSIS — M2550 Pain in unspecified joint: Secondary | ICD-10-CM | POA: Insufficient documentation

## 2016-09-21 DIAGNOSIS — Z79899 Other long term (current) drug therapy: Secondary | ICD-10-CM | POA: Diagnosis not present

## 2016-09-21 MED ORDER — SODIUM CHLORIDE 0.9% FLUSH
10.0000 mL | INTRAVENOUS | Status: DC | PRN
  Administered 2016-09-21: 10 mL via INTRAVENOUS
  Filled 2016-09-21: qty 10

## 2016-09-21 MED ORDER — IOPAMIDOL (ISOVUE-300) INJECTION 61%
125.0000 mL | Freq: Once | INTRAVENOUS | Status: AC | PRN
  Administered 2016-09-21: 125 mL via INTRAVENOUS

## 2016-09-21 MED ORDER — HEPARIN SOD (PORK) LOCK FLUSH 100 UNIT/ML IV SOLN
500.0000 [IU] | Freq: Once | INTRAVENOUS | Status: AC
  Administered 2016-09-21: 500 [IU] via INTRAVENOUS
  Filled 2016-09-21: qty 5

## 2016-09-21 MED ORDER — TECHNETIUM TC 99M MEDRONATE IV KIT
25.0000 | PACK | Freq: Once | INTRAVENOUS | Status: AC | PRN
  Administered 2016-09-21: 24.51 via INTRAVENOUS

## 2016-09-21 NOTE — Addendum Note (Signed)
Addended by: Nemiah Commander R on: 09/21/2016 02:03 PM   Modules accepted: Orders, SmartSet

## 2016-09-26 ENCOUNTER — Ambulatory Visit: Payer: 59 | Admitting: Occupational Therapy

## 2016-09-26 DIAGNOSIS — M25512 Pain in left shoulder: Secondary | ICD-10-CM

## 2016-09-26 DIAGNOSIS — I972 Postmastectomy lymphedema syndrome: Secondary | ICD-10-CM

## 2016-09-26 DIAGNOSIS — R29898 Other symptoms and signs involving the musculoskeletal system: Secondary | ICD-10-CM

## 2016-09-26 DIAGNOSIS — M25612 Stiffness of left shoulder, not elsewhere classified: Secondary | ICD-10-CM

## 2016-09-26 DIAGNOSIS — L599 Disorder of the skin and subcutaneous tissue related to radiation, unspecified: Secondary | ICD-10-CM

## 2016-09-26 NOTE — Therapy (Signed)
[facility] [address] [place], [address] Phone: [phone]   Fax:  [phone]  Occupational Therapy Treatment  Patient Details  Name: Amanda [name] MRN: [id number] Date of Birth: Jul 09, 1950 Referring Provider: Grayland Ormond  Encounter Date: 09/26/2016      OT End of Session - 09/26/16 1041    Visit Number 3   Number of Visits 24   Date for OT Re-Evaluation 10/05/16   OT Start Time 0917   OT Stop Time 0955   OT Time Calculation (min) 38 min   Activity Tolerance Patient tolerated treatment well   Behavior During Therapy [facility] for tasks assessed/performed      Past Medical History:  Diagnosis Date  . Back pain    occasionally  . Breast cancer ([place])    left  . Depression    takes Paxil and Wellbutrin daily  . Diabetes ([place])    takes Metformin daily  . GERD (gastroesophageal reflux disease)   . History of bronchitis 2 yrs ago  . Hyperlipidemia    takes Atorvastatin daily  . Hypertension    takes Losartan-HCTZ daily  . Insomnia    takes Ambien nightly  . Insomnia    takes gabapentin nightly  . Joint pain   . Migraine   . Mood swings ([place])   . Osteoarthritis of knee   . PONV (postoperative nausea and vomiting)   . Seasonal allergies    takes Allegra daily    Past Surgical History:  Procedure Laterality Date  . BREAST BIOPSY  2009  . cataract surgery Bilateral   . COLONOSCOPY    . JOINT REPLACEMENT Left 2014   knee  . KNEE ARTHROSCOPY Left    x 5  . MASTECTOMY Left   . port a cath placed    . PORTACATH PLACEMENT N/A 09/17/2015   Procedure: INSERTION PORT-A-CATH;  Surgeon: Jules Husbands, MD;  Location: [facility];  Service: General;  Laterality: N/A;  . TOTAL SHOULDER ARTHROPLASTY Right 06/03/2015   Procedure: TOTAL SHOULDER ARTHROPLASTY;  Surgeon: Tania Ade, MD;  Location: [facility];  Service: Orthopedics;  Laterality: Right;  Right total shoulder arthroplasty  . TOTAL SHOULDER  REPLACEMENT Right 06/03/2015  . TUBAL LIGATION      There were no vitals filed for this visit.      Subjective Assessment - 09/26/16 1035    Subjective  I could not find a bra my size in sport -  I did not get bra for years - so I could not keep the jovipak breast pad in - so I think my upper arm is still increase    Patient Stated Goals Patient reports she wants to control the swelling in her left arm, be able to take care of herself.    Currently in Pain? No/denies             LYMPHEDEMA/ONCOLOGY QUESTIONNAIRE - 09/26/16 0932      Left Upper Extremity Lymphedema   15 cm Proximal to Olecranon Process 43.6 cm   10 cm Proximal to Olecranon Process 42 cm   Olecranon Process 29 cm   15 cm Proximal to Ulnar Styloid Process 28.5 cm   10 cm Proximal to Ulnar Styloid Process 25.2 cm   Just Proximal to Ulnar Styloid Process 17.8 cm          She was seen at different clinic for shoulder PT and lymphedema earlier this year Pt was fitted with compression bra,  daytime sleeve and gauntlet - and a night time compression - report she has been only wearing compression bra Pt report she was only wearing tight compression bra since spring   cannot get lymph pad inside  But arm and shoulder at time really sore and hurting  Pt do have HEP for shoulder strengthening   pt daytime sleeves do not fit upper arm at this time because of increase in circumference when compare this date to last time   still increase - pt unable to get comfortable sports bra for her size - Pt to get camisole to wear jovipak at home and night time  Fitted with new Tubi grip sleeve for upper arm F and hand to elbow D  At home and night time  Reviewed with pt to do MLD 1 x day - was not doing per pt  Hand out provided :   For Left Arm:  1. Hug yourself at the base of your neck and do 8 small circles; and 2 fingers behind clavicle 8 x 2. Do 5-8 semicircles at R armpit and L groin 3. Pump across chest from left to  right 8 times 4. Pump down the left side of trunk from armpit to groin 8 times 5. Pump up the outside of left upper arm 8 times and inside of upper arm to outside                  8 x and then outside again 8 x 6. Pump across chest from L to R 8 times 7. Pump  down the left side of trunk from armpit to groin 8 times 14. Do 5-8 semicircles at right armpit and left groin 4-5 times 15. Repeat nr. 1  SLOW and LIGHT with only your palm NOT FINGERTIPS                    OT Education - 09/26/16 1039    Education provided Yes   Education Details tubigrip wearing for arm- jovipak with camisole wearing - and MLD   Person(s) Educated Patient   Methods Explanation;Demonstration;Tactile cues   Comprehension Returned demonstration;Verbalized understanding             OT Long Term Goals - 09/20/16 1055      OT LONG TERM GOAL #1   Title Patient will be independent in wear, care and schedule of compression garments.    Baseline not currently wearing sleeve or gauntlet, just compression bra at times.    Time 6   Period Weeks   Status On-going     OT LONG TERM GOAL #2   Title Patient will demonstrate HEP with modified independence.    Baseline not currently participating in HEP per pt    Time 6   Period Weeks   Status On-going     OT LONG TERM GOAL #3   Title Patient will decrease circumferential measurements by 1.5 cm from mid forearm to upper arm to fit into compression garment and for maintenace of lymphedema symptoms.    Baseline see flow sheet for measurements   Time 6   Period Weeks   Status On-going               Plan - 09/26/16 1054    Clinical Impression Statement Pt's forearm and elbow decrease in circumference but upper arm increase - pt report she could not get comfortable bra -and could not keep jovipak in place - did wear Tubigrip on arm that  she got in April - fitted with new one F and D to wear in meantaim - pt to get camisole and wear jovipak -  review again MLD to be done - will reassess in week    Occupational performance deficits (Please refer to evaluation for details): ADL's;IADL's;Social Participation;Rest and Sleep   Rehab Potential Good   Current Impairments/barriers affecting progress: metastatic disease.  Husband travels for work for extended periods of time in which patient is alone.    OT Frequency 2x / week   OT Duration 4 weeks   OT Treatment/Interventions Self-care/ADL training;DME and/or AE instruction;Manual lymph drainage;Patient/family education;Compression bandaging;Therapeutic exercises;Scar mobilization;Passive range of motion;Manual Therapy   Clinical Decision Making Several treatment options, min-mod task modification necessary   OT Home Exercise Plan see pt instructions    Consulted and Agree with Plan of Care Patient      Patient will benefit from skilled therapeutic intervention in order to improve the following deficits and impairments:  Decreased range of motion, Increased edema, Decreased scar mobility, Pain, Impaired UE functional use, Decreased strength  Visit Diagnosis: Postmastectomy lymphedema  Left shoulder pain, unspecified chronicity  Stiffness of left shoulder, not elsewhere classified  Disorder of the skin and subcutaneous tissue related to radiation, unspecified  Other symptoms and signs involving the musculoskeletal system    Problem List Patient Active Problem List   Diagnosis Date Noted  . Metastatic breast cancer ([place]) 09/24/2015  . 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 Personal history of breast cancer 08/23/2015  . 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 Degenerative arthritis of right shoulder region 01/13/2015  . Cancer of left female breast  ([place]) 07/27/2014  . 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 Rotator cuff tear 06/10/2014  . Allergic rhinitis, seasonal 09/16/2013  . Depression 08/10/2013  .  Diabetes mellitus type 2, uncomplicated ([place]) 08/81/1031  . Hyperlipidemia, unspecified 08/10/2013  . BP (high blood pressure) 08/10/2013  . 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, [place], [address] Phone: [phone]   Fax:  [phone]  Name: Amanda [name] MRN: [id number] Date of Birth: 05/13/1950

## 2016-09-26 NOTE — Progress Notes (Signed)
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 Now with biopsy-proven stage IV disease.   INTERVAL HISTORY: Patient returns to clinic today for results of CT scans and Bone Scan. Today she is anxious but overall feels well. She continues to complain of a "so-so" appetite and left sided rib pain. The pain is very close to an incisional scar and she also thinks it could be due to radiation. Otherwise she feels well.  She continues to have chronic peripheral neuropathy in her hands and feet and currently takes gabapentin with minimal relief. She completed palliative radiation to T11-12 08/14/16. She continues to tolerate letrozole well without significant side effects. She has no other neurologic complaints. She denies any recent fevers or illnesses. She denies any chest pain or shortness of breath. She denies any nausea, vomiting, constipation, or diarrhea. She has no urinary complaints.  Patient offers no further specific complaints today.  REVIEW OF SYSTEMS:   Review of Systems  Constitutional: Negative.  Negative for fever, malaise/fatigue and weight loss.  HENT: Negative for sore throat.   Eyes: Negative.  Negative for blurred vision.  Respiratory: Negative.  Negative for cough and shortness of breath.   Cardiovascular: Negative.  Negative for chest pain and leg swelling.  Gastrointestinal: Negative.  Negative for abdominal pain, constipation, diarrhea, nausea and vomiting.  Genitourinary: Negative.   Musculoskeletal: Positive for joint pain and myalgias. Negative for back pain.       Left rib  Neurological: Positive for tingling and sensory  change. Negative for weakness and headaches.  Endo/Heme/Allergies: Negative.   Psychiatric/Behavioral: Positive for memory loss. Negative for depression. The patient is nervous/anxious.     As per HPI. Otherwise, a complete review of systems is negative.  PAST MEDICAL HISTORY: Past Medical History:  Diagnosis Date  . Back pain    occasionally  . Breast cancer ([place])    left  . Depression    takes Paxil and Wellbutrin daily  . Diabetes ([place])    takes Metformin daily  . GERD (gastroesophageal reflux disease)   . History of bronchitis 2 yrs ago  . Hyperlipidemia    takes Atorvastatin daily  . Hypertension    takes Losartan-HCTZ daily  . Insomnia    takes Ambien nightly  . Insomnia    takes gabapentin nightly  . Joint pain   . Migraine   . Mood swings ([place])   . Osteoarthritis of knee   . PONV (postoperative nausea and vomiting)   . Seasonal allergies    takes Allegra daily    PAST SURGICAL HISTORY: Past Surgical History:  Procedure Laterality Date  . BREAST BIOPSY  2009  . cataract surgery Bilateral   . COLONOSCOPY    . JOINT REPLACEMENT Left 2014   knee  . KNEE ARTHROSCOPY Left    x 5  . MASTECTOMY Left   . port a cath placed    . PORTACATH PLACEMENT N/A 09/17/2015   Procedure: INSERTION PORT-A-CATH;  Surgeon: Jules Husbands, MD;  Location: [facility];  Service: General;  Laterality: N/A;  . TOTAL SHOULDER ARTHROPLASTY Right 06/03/2015   Procedure: TOTAL SHOULDER ARTHROPLASTY;  Surgeon: Tania Ade, MD;  Location: [place];  Service: Orthopedics;  Laterality: Right;  Right total shoulder arthroplasty  . TOTAL SHOULDER REPLACEMENT Right 06/03/2015  . TUBAL LIGATION      FAMILY HISTORY: Father with non-Hodgkin's lymphoma, 2 paternal aunts with breast cancer.     ADVANCED DIRECTIVES:    HEALTH MAINTENANCE: Social History  Substance Use Topics  . Smoking status: Never Smoker  . Smokeless tobacco: Never Used  . Alcohol use 0.0 oz/week     Comment: occasionally  wine     Colonoscopy:  PAP:  Bone density: 01/26/2016  Lipid panel:  Allergies  Allergen Reactions  . Ace Inhibitors Cough  . Latex Itching  . Morphine And Related Itching    Caused her to itch terribly. Would prefer if given to take with a benadryl  . Other Itching    Freeze spray. Patient stated that she may be able to use it now because she doesn't use it as often.  Marland Kitchen Penicillins Rash    Has patient had a PCN reaction causing immediate rash, facial/tongue/throat swelling, SOB or lightheadedness with hypotension: No Has patient had a PCN reaction causing severe rash involving mucus membranes or skin necrosis: No Has patient had a PCN reaction that required hospitalization No Has patient had a PCN reaction occurring within the last 10 years: No If all of the above answers are "NO", then may proceed with Cephalosporin use.    Current Outpatient Prescriptions  Medication Sig Dispense Refill  . aspirin EC 81 MG tablet Take 81 mg by mouth daily.     Marland Kitchen atorvastatin (LIPITOR) 10 MG tablet Take 10 mg by mouth daily at 6 PM.     . b complex vitamins capsule Take 1 capsule by mouth daily.    Marland Kitchen buPROPion (WELLBUTRIN XL) 150 MG 24 hr tablet Take 150 mg by mouth every morning.     . Calcium-Magnesium-Vitamin D (CALCIUM 1200+D3 PO) Take 1 Dose by mouth daily.    . fexofenadine (ALLEGRA) 180 MG tablet Take 180 mg by mouth at bedtime.     . gabapentin (NEURONTIN) 300 MG capsule Take 300 mg by mouth. 2 pills in am,2 pills at noon and 3 pills at night    . HYDROcodone-acetaminophen (NORCO) 5-325 MG tablet Take 1-2 tablets by mouth every 4 (four) hours as needed for moderate pain. 30 tablet 0  . letrozole (FEMARA) 2.5 MG tablet Take 1 tablet (2.5 mg total) by mouth daily. 90 tablet 3  . losartan-hydrochlorothiazide (HYZAAR) 100-25 MG per tablet Take 1 tablet by mouth every morning.     . metFORMIN (GLUCOPHAGE) 850 MG tablet Take 850 mg by mouth 2 (two) times daily with a meal.     . ondansetron  (ZOFRAN ODT) 4 MG disintegrating tablet Take 1 tablet (4 mg total) by mouth every 8 (eight) hours as needed for nausea or vomiting. 20 tablet 0  . PARoxetine (PAXIL) 40 MG tablet Take 40 mg by mouth every morning.     . Zolpidem Tartrate (AMBIEN PO) Take by mouth.    . lidocaine-prilocaine (EMLA) cream Apply 1 application topically as needed. Apply to port 1-2 hours prior to chemotherapy appointment. Cover with plastic wrap. (Patient not taking: Reported on 09/27/2016) 30 g 0  . oxyCODONE-acetaminophen (ROXICET) 5-325 MG tablet Take 1 tablet by mouth every 6 (six) hours as needed. (Patient not taking: Reported on 08/28/2016) 20 tablet 0   Current Facility-Administered Medications  Medication Dose Route Frequency Provider Last Rate Last Dose  . guaiFENesin-dextromethorphan (ROBITUSSIN DM) 100-10 MG/5ML syrup 5 mL  5  mL Oral Once Lloyd Huger, MD        OBJECTIVE: Vitals:   09/27/16 1030  BP: 102/67  Pulse: 82  Resp: 18  Temp: (!) 97.5 F (36.4 C)     Body mass index is 36.2 kg/m.    ECOG FS:0 - Asymptomatic  General: Well-developed, well-nourished, no acute distress, accompanied by husband. Eyes: Pink conjunctiva, anicteric sclera. HEENT: No palpable lymphadenopathy. Breast: Exam deferred today. Lungs: Clear to auscultation bilaterally. Heart: Regular rate and rhythm. No rubs, murmurs, or gallops. Abdomen: Soft, nontender, nondistended. No organomegaly noted, normoactive bowel sounds. Musculoskeletal: No edema, cyanosis, or clubbing. Neuro: Alert, answering all questions appropriately. Cranial nerves grossly intact. Skin: No rashes or petechiae noted. Psych: Normal affect.   LAB RESULTS:  Lab Results  Component Value Date   NA 138 06/26/2016   K 3.9 06/26/2016   CL 104 06/26/2016   CO2 27 06/26/2016   GLUCOSE 162 (H) 06/26/2016   BUN 24 (H) 06/26/2016   CREATININE 1.04 (H) 06/26/2016   CALCIUM 9.4 06/26/2016   PROT 7.2 06/26/2016   ALBUMIN 4.2 06/26/2016   AST 47  (H) 06/26/2016   ALT 35 06/26/2016   ALKPHOS 65 06/26/2016   BILITOT 0.6 06/26/2016   GFRNONAA 56 (L) 06/26/2016   GFRAA >60 06/26/2016    Lab Results  Component Value Date   WBC 3.4 (L) 08/07/2016   NEUTROABS 2.6 06/26/2016   HGB 12.7 08/07/2016   HCT 36.9 08/07/2016   MCV 91.6 08/07/2016   PLT 254 08/07/2016     STUDIES: Ct Soft Tissue Neck W Contrast  Result Date: 09/21/2016 CLINICAL DATA:  Inflammatory breast carcinoma.  Staging. EXAM: CT NECK WITH CONTRAST TECHNIQUE: Multidetector CT imaging of the neck was performed using the standard protocol following the bolus administration of intravenous contrast. CONTRAST:  166m ISOVUE-300 IOPAMIDOL (ISOVUE-300) INJECTION 61% COMPARISON:  None. FINDINGS: Pharynx and larynx: No mucosal or submucosal lesion. Salivary glands: Submandibular and parotid glands are normal. Thyroid: Normal Lymph nodes: No enlarged or low-density nodes on either side of the neck. Vascular: Right internal jugular venous catheter. No significant vascular finding. Limited intracranial: Negative Visualized orbits: Normal Mastoids and visualized paranasal sinuses: Clear Skeleton: Ordinary mid cervical spondylosis. Nonspecific 8 mm sclerotic region within the posterior aspect of the C4 vertebral body and smaller areas within the C7 vertebral body and C7 spinous process. These could represent metastatic deposits. No lytic lesions or extraosseous tumor identified. Upper chest: See results of chest CT. Other: None IMPRESSION: No evidence of soft tissue metastatic disease in the neck. Probable sclerotic bone metastases in the cervical spine, most prominent within the C4 vertebral body but also noted at C7. Electronically Signed   By: MNelson ChimesM.D.   On: 09/21/2016 13:08   Ct Chest W Contrast  Result Date: 09/21/2016 CLINICAL DATA:  Metastatic inflammatory left breast cancer. EXAM: CT CHEST, ABDOMEN, AND PELVIS WITH CONTRAST TECHNIQUE: Multidetector CT imaging of the chest,  abdomen and pelvis was performed following the standard protocol during bolus administration of intravenous contrast. CONTRAST:  1240mISOVUE-300 IOPAMIDOL (ISOVUE-300) INJECTION 61% COMPARISON:  Multiple exams, including 06/26/2016 FINDINGS: CT CHEST FINDINGS Cardiovascular: Right central line tip:  Cavoatrial junction. Atherosclerotic calcification of the aortic arch and left anterior descending coronary artery with borderline cardiomegaly. Mediastinum/Nodes: No pathologic thoracic adenopathy identified. Lungs/Pleura: Postradiation therapy findings noted in the lingula including scarring. The prior density along the left hemidiaphragm has essentially resolved. There are new ground-glass nodular densities in both lower lobes, left  greater than right, probably from alveolitis, but meriting surveillance Musculoskeletal: Right total shoulder arthroplasty. In widespread osseous metastatic disease, roughly similar distribution compared to the prior exam, although there is a new and possibly pathologic fracture of the right fifth rib laterally with a small amount of associated periostitis on image 57/6. Similar size of the fluid collection at the left mastectomy site. Roughly similar stranding in the surrounding subcutaneous tissues without an obvious progressive soft tissue mass in this vicinity. CT ABDOMEN PELVIS FINDINGS Hepatobiliary: Unremarkable Pancreas: Unremarkable Spleen: 1.4 cm focus of accentuated enhancement peripherally in the spleen on image 49/3, unchanged from 10/08/2007 and not hypermetabolic on intervening PET CTs, indicating a benign process such as hemangioma. Adrenals/Urinary Tract: Adrenal glands normal. Left greater than right peripelvic cysts. Stomach/Bowel: Unremarkable Vascular/Lymphatic: Aortoiliac atherosclerotic vascular disease. Reproductive: Unremarkable Other: No supplemental non-categorized findings. Musculoskeletal: Stable scattered osseous metastatic disease without significant  progression or enlargement identified. Lumbar spondylosis and degenerative disc disease causing impingement at multiple levels including L3-4, L4-5, and L5-S1. IMPRESSION: 1. Widespread but generally stable osseous metastatic disease. The only change noted is a new likely pathologic fracture laterally in the right fifth rib. 2. Postoperative findings in the left breast are similar to prior. Post radiation therapy findings in the adjacent lung. 3. The prior nodular density along the left hemidiaphragm has resolved. However, there new ground-glass nodular densities in both lower lobes probably from alveolitis but warranting surveillance. 4.  Aortic Atherosclerosis (ICD10-I70.0).  Coronary atherosclerosis. 5. 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 6. Lumbar spondylosis and degenerative disc disease with lower lumbar impingement resulting. Electronically Signed   By: Van Clines M.D.   On: 09/21/2016 15:47   Nm Bone Scan Whole Body  Result Date: 09/21/2016 CLINICAL DATA:  Metastatic breast cancer. History of old fracture left wrist, left knee right shoulder replacements EXAM: NUCLEAR MEDICINE WHOLE BODY BONE SCAN TECHNIQUE: Whole body anterior and posterior images were obtained approximately 3 hours after intravenous injection of radiopharmaceutical. RADIOPHARMACEUTICALS:  24.51 mCi Technetium-55mMDP IV COMPARISON:  CT 09/21/2016, 06/26/2016, bone scan 07/21/2016, 12/27/1998 FINDINGS: Physiologic activity within the kidneys. Multifocal abnormal activity, consistent with diffuse skeletal metastatic disease. Increased foci of activity within the calvarium. Bilateral right greater than left shoulder activity re- demonstrated, slightly more intense on the left side. Increased sternal activity since the prior bone scan. Multiple foci of bilateral rib activity, slight increase in number and intensity of pre-existing lesions. Multiple foci of activity involving the thoracic and  lumbar spine with intense activity at L1 re- demonstrated. Slight increased intensity of activity at T11-T12 with new or increasing activity at additional foci in the thoracic spine. Re- demonstrated foci of activity within the bilateral femoral trochanters, ischial bones, and sacrum. Photopenia at the left knee and right shoulder corresponding to history of arthroplasty. Similar appearance of right greater than left foot and ankle activity presumably degenerative. IMPRESSION: Multifocal abnormal activity consistent with diffuse skeletal metastatic disease. New foci of activity seen within the calvarium, sternum, ribs and spine and increased intensity of several pre-existing metastatic foci concerning for progression of skeletal metastatic disease. Electronically Signed   By: KDonavan FoilM.D.   On: 09/21/2016 14:36   Ct Abdomen Pelvis W Contrast  Result Date: 09/21/2016 CLINICAL DATA:  Metastatic inflammatory left breast cancer. EXAM: CT CHEST, ABDOMEN, AND PELVIS WITH CONTRAST TECHNIQUE: Multidetector CT imaging of the chest, abdomen and pelvis was performed following the standard protocol during bolus administration of intravenous contrast. CONTRAST:  1279m  ISOVUE-300 IOPAMIDOL (ISOVUE-300) INJECTION 61% COMPARISON:  Multiple exams, including 06/26/2016 FINDINGS: CT CHEST FINDINGS Cardiovascular: Right central line tip:  Cavoatrial junction. Atherosclerotic calcification of the aortic arch and left anterior descending coronary artery with borderline cardiomegaly. Mediastinum/Nodes: No pathologic thoracic adenopathy identified. Lungs/Pleura: Postradiation therapy findings noted in the lingula including scarring. The prior density along the left hemidiaphragm has essentially resolved. There are new ground-glass nodular densities in both lower lobes, left greater than right, probably from alveolitis, but meriting surveillance Musculoskeletal: Right total shoulder arthroplasty. In widespread osseous metastatic  disease, roughly similar distribution compared to the prior exam, although there is a new and possibly pathologic fracture of the right fifth rib laterally with a small amount of associated periostitis on image 57/6. Similar size of the fluid collection at the left mastectomy site. Roughly similar stranding in the surrounding subcutaneous tissues without an obvious progressive soft tissue mass in this vicinity. CT ABDOMEN PELVIS FINDINGS Hepatobiliary: Unremarkable Pancreas: Unremarkable Spleen: 1.4 cm focus of accentuated enhancement peripherally in the spleen on image 49/3, unchanged from 10/08/2007 and not hypermetabolic on intervening PET CTs, indicating a benign process such as hemangioma. Adrenals/Urinary Tract: Adrenal glands normal. Left greater than right peripelvic cysts. Stomach/Bowel: Unremarkable Vascular/Lymphatic: Aortoiliac atherosclerotic vascular disease. Reproductive: Unremarkable Other: No supplemental non-categorized findings. Musculoskeletal: Stable scattered osseous metastatic disease without significant progression or enlargement identified. Lumbar spondylosis and degenerative disc disease causing impingement at multiple levels including L3-4, L4-5, and L5-S1. IMPRESSION: 1. Widespread but generally stable osseous metastatic disease. The only change noted is a new likely pathologic fracture laterally in the right fifth rib. 2. Postoperative findings in the left breast are similar to prior. Post radiation therapy findings in the adjacent lung. 3. The prior nodular density along the left hemidiaphragm has resolved. However, there new ground-glass nodular densities in both lower lobes probably from alveolitis but warranting surveillance. 4.  Aortic Atherosclerosis (ICD10-I70.0).  Coronary atherosclerosis. 5. 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 6. Lumbar spondylosis and degenerative disc disease with lower lumbar impingement resulting. Electronically Signed    By: Van Clines M.D.   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 Now with biopsy proven stage IV disease with lymph node and bone metastasis.  PLAN:    1.  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 Continue letrozole indefinitely or until progression of disease. Will require a mammogram in November 2018. Results from recent CT sacns were generally stable. Her Bone Scan showed Multifocal abnormal activity consistent with diffuse skeletal metastatic disease. New foci of activity seen within the calvarium, sternum, ribs and spine and increased intensity of several pre-existing metastatic foci concerning for progression of skeletal metastatic disease. Dr. Grayland Ormond was consulted and he recommends potentially beginning Zometa and Ibrance in the future. Patient was given educational materials and will see Dr. Grayland Ormond in a few weeks to discuss future care and treatment. 2.  Osteopenia: Patient's bone mineral density on January 26, 2016 reported a T score of -0.9 which is considered normal. Repeat in January 2020. Continue calcium and vitamin D.  3. Anemia: Resolved. 4. Peripheral neuropathy: Continue gabapentin as prescribed.  5. Appetite/Taste Changes- Weight is stable at this time. Will monitor and can consider referral to dietician  in the future if needed.   Patient expressed understanding and was in agreement with this plan. She also understands that She can call clinic at any time with any questions, concerns, or complaints.   Breast cancer   Staging form: Breast, AJCC 7th Edition     Pathologic stage from 08/11/2014: Stage IIIA (T0, N2a, cM0) - Signed by Lloyd Huger, MD on 08/11/2014  Patient was seen and evaluated independently and I agree with the assessment and plan as dictated above.  Jacquelin Hawking, NP 09/27/16 12:48 PM

## 2016-09-26 NOTE — Patient Instructions (Signed)
MLD - hand out review and provided Camisole to wear jovipak during day and home and night time   Tubi grip d on hand to elbow and F elbow to upper arm  At home and night time  Precautions review

## 2016-09-27 ENCOUNTER — Ambulatory Visit: Payer: 59 | Admitting: Radiation Oncology

## 2016-09-27 ENCOUNTER — Inpatient Hospital Stay (HOSPITAL_BASED_OUTPATIENT_CLINIC_OR_DEPARTMENT_OTHER): Payer: 59 | Admitting: Oncology

## 2016-09-27 ENCOUNTER — Inpatient Hospital Stay: Payer: 59

## 2016-09-27 VITALS — BP 102/67 | HR 82 | Temp 97.5°F | Resp 18 | Wt 210.9 lb

## 2016-09-27 DIAGNOSIS — E785 Hyperlipidemia, unspecified: Secondary | ICD-10-CM | POA: Diagnosis not present

## 2016-09-27 DIAGNOSIS — K219 Gastro-esophageal reflux disease without esophagitis: Secondary | ICD-10-CM | POA: Diagnosis not present

## 2016-09-27 DIAGNOSIS — M899 Disorder of bone, unspecified: Secondary | ICD-10-CM

## 2016-09-27 DIAGNOSIS — M2550 Pain in unspecified joint: Secondary | ICD-10-CM

## 2016-09-27 DIAGNOSIS — Z7984 Long term (current) use of oral hypoglycemic drugs: Secondary | ICD-10-CM

## 2016-09-27 DIAGNOSIS — F329 Major depressive disorder, single episode, unspecified: Secondary | ICD-10-CM

## 2016-09-27 DIAGNOSIS — E119 Type 2 diabetes mellitus without complications: Secondary | ICD-10-CM

## 2016-09-27 DIAGNOSIS — Z923 Personal history of irradiation: Secondary | ICD-10-CM

## 2016-09-27 DIAGNOSIS — Z452 Encounter for adjustment and management of vascular access device: Secondary | ICD-10-CM

## 2016-09-27 DIAGNOSIS — C50912 Malignant neoplasm of unspecified site of left female breast: Secondary | ICD-10-CM

## 2016-09-27 DIAGNOSIS — I10 Essential (primary) hypertension: Secondary | ICD-10-CM

## 2016-09-27 DIAGNOSIS — C50919 Malignant neoplasm of unspecified site of unspecified female breast: Secondary | ICD-10-CM

## 2016-09-27 DIAGNOSIS — Z79899 Other long term (current) drug therapy: Secondary | ICD-10-CM

## 2016-09-27 DIAGNOSIS — R0781 Pleurodynia: Secondary | ICD-10-CM

## 2016-09-27 DIAGNOSIS — I700 Atherosclerosis of aorta: Secondary | ICD-10-CM

## 2016-09-27 DIAGNOSIS — Z170 Estrogen receptor positive status [ER+]: Secondary | ICD-10-CM

## 2016-09-27 DIAGNOSIS — Z79811 Long term (current) use of aromatase inhibitors: Secondary | ICD-10-CM

## 2016-09-27 DIAGNOSIS — G4700 Insomnia, unspecified: Secondary | ICD-10-CM

## 2016-09-27 DIAGNOSIS — M8580 Other specified disorders of bone density and structure, unspecified site: Secondary | ICD-10-CM

## 2016-09-27 DIAGNOSIS — G629 Polyneuropathy, unspecified: Secondary | ICD-10-CM

## 2016-09-27 DIAGNOSIS — Z8669 Personal history of other diseases of the nervous system and sense organs: Secondary | ICD-10-CM

## 2016-09-27 DIAGNOSIS — M1990 Unspecified osteoarthritis, unspecified site: Secondary | ICD-10-CM

## 2016-09-27 DIAGNOSIS — I2510 Atherosclerotic heart disease of native coronary artery without angina pectoris: Secondary | ICD-10-CM

## 2016-09-28 LAB — CANCER ANTIGEN 27.29: CA 27.29: 42.8 U/mL — ABNORMAL HIGH (ref 0.0–38.6)

## 2016-10-03 ENCOUNTER — Ambulatory Visit: Payer: 59 | Admitting: Occupational Therapy

## 2016-10-03 DIAGNOSIS — I972 Postmastectomy lymphedema syndrome: Secondary | ICD-10-CM

## 2016-10-03 DIAGNOSIS — R29898 Other symptoms and signs involving the musculoskeletal system: Secondary | ICD-10-CM

## 2016-10-03 DIAGNOSIS — M25512 Pain in left shoulder: Secondary | ICD-10-CM

## 2016-10-03 DIAGNOSIS — M25612 Stiffness of left shoulder, not elsewhere classified: Secondary | ICD-10-CM

## 2016-10-03 DIAGNOSIS — L599 Disorder of the skin and subcutaneous tissue related to radiation, unspecified: Secondary | ICD-10-CM

## 2016-10-03 NOTE — Patient Instructions (Signed)
Pt to wear jovipak breast pad every day at home and at night time inside camisole   and cone with MLD  Daytime compression sleeve donn this date using slippygator - able to get on - and to wear during day with gauntlet

## 2016-10-03 NOTE — Progress Notes (Signed)
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 Now with biopsy-proven stage IV disease.  INTERVAL HISTORY: Patient returns to clinic today for further evaluation and consideration of initiating treatment with Ibrance, letrozole, and Zometa. She continues to have neuropathy in her fingertips and feet which is unchanged. She otherwise feels well and is asymptomatic. She has no other neurologic complaints. She denies any pain. She denies any recent fevers or illnesses.  She denies any chest pain or shortness of breath. She denies any nausea, vomiting, constipation, or diarrhea.  She has no urinary complaints.  Patient offers no specific complaints today.  REVIEW OF SYSTEMS:   Review of Systems  Constitutional: Negative.  Negative for fever, malaise/fatigue and weight loss.  Eyes: Negative.  Negative for blurred vision.  Respiratory: Negative.  Negative for cough and shortness of breath.   Cardiovascular: Negative.  Negative for chest pain and leg swelling.  Gastrointestinal: Negative.  Negative for abdominal pain.  Genitourinary: Negative.   Musculoskeletal: Negative for back pain.  Neurological: Positive for sensory change. Negative for weakness.  Endo/Heme/Allergies: Negative.   Psychiatric/Behavioral: Negative.  Negative for memory loss. The patient is not nervous/anxious.     As per HPI. Otherwise, a complete review of systems is negative.  PAST MEDICAL HISTORY: Past Medical History:  Diagnosis Date  . Back pain    occasionally  . Breast cancer ([place])    left  . Depression    takes Paxil and Wellbutrin daily  . Diabetes ([place])    takes Metformin daily  . GERD (gastroesophageal reflux disease)   . History of bronchitis 2 yrs ago  . Hyperlipidemia    takes Atorvastatin daily  . Hypertension    takes Losartan-HCTZ daily  . Insomnia    takes Ambien nightly  . Insomnia    takes gabapentin nightly  . Joint pain   . Migraine   . Mood swings ([place])   . Osteoarthritis of knee   . PONV (postoperative nausea and vomiting)   . Seasonal allergies    takes Allegra daily    PAST SURGICAL HISTORY: Past Surgical History:  Procedure Laterality Date  . BREAST BIOPSY  2009  . cataract surgery Bilateral   . COLONOSCOPY    . JOINT REPLACEMENT Left 2014   knee  . KNEE ARTHROSCOPY Left    x 5  . MASTECTOMY Left   . port a cath placed    . PORTACATH PLACEMENT N/A 09/17/2015   Procedure: INSERTION PORT-A-CATH;  Surgeon: Jules Husbands, MD;  Location: [facility];  Service: General;  Laterality: N/A;  . TOTAL SHOULDER ARTHROPLASTY Right 06/03/2015   Procedure: TOTAL SHOULDER ARTHROPLASTY;  Surgeon: Tania Ade, MD;  Location: [facility];  Service: Orthopedics;  Laterality: Right;  Right total shoulder arthroplasty  . TOTAL SHOULDER REPLACEMENT Right 06/03/2015  . TUBAL LIGATION      FAMILY HISTORY: Father with non-Hodgkin's lymphoma, 2 paternal aunts with breast cancer.     ADVANCED DIRECTIVES:    HEALTH MAINTENANCE: Social History  Substance Use Topics  . Smoking status: Never Smoker  . Smokeless tobacco: Never Used  . Alcohol use 0.0 oz/week     Comment: occasionally wine     Colonoscopy:  PAP:  Bone density:  Lipid panel:  Allergies  Allergen Reactions  . Ace Inhibitors Cough  . Latex Itching  . Morphine And Related Itching    Caused her to itch terribly. Would prefer if given to take with a benadryl  . Other Itching    Freeze spray. Patient stated that she may be able to use it now because she doesn't use it as often.  Marland Kitchen Penicillins Rash    Has patient had a PCN reaction causing immediate rash,  facial/tongue/throat swelling, SOB or lightheadedness with hypotension: No Has patient had a PCN reaction causing severe rash involving mucus membranes or skin necrosis: No Has patient had a PCN reaction that required hospitalization No Has patient had a PCN reaction occurring within the last 10 years: No If all of the above answers are "NO", then may proceed with Cephalosporin use.    Current Outpatient Prescriptions  Medication Sig Dispense Refill  . aspirin EC 81 MG tablet Take 81 mg by mouth daily.     Marland Kitchen atorvastatin (LIPITOR) 10 MG tablet Take 10 mg by mouth daily at 6 PM.     . b complex vitamins capsule Take 1 capsule by mouth daily.    Marland Kitchen buPROPion (WELLBUTRIN XL) 150 MG 24 hr tablet Take 150 mg by mouth every morning.     . Calcium-Magnesium-Vitamin D (CALCIUM 1200+D3 PO) Take 1 Dose by mouth daily.    . fexofenadine (ALLEGRA) 180 MG tablet Take 180 mg by mouth at bedtime.     . gabapentin (NEURONTIN) 300 MG capsule Take 300 mg by mouth. 2 pills in am,2 pills at noon and 3 pills at night    . HYDROcodone-acetaminophen (NORCO) 5-325 MG tablet Take 1-2 tablets by mouth every 4 (four) hours as needed for moderate pain. 30 tablet 0  . letrozole (FEMARA) 2.5 MG tablet Take 1 tablet (2.5 mg total) by mouth daily. 90 tablet 3  . losartan-hydrochlorothiazide (HYZAAR) 100-25 MG per tablet Take 1 tablet by mouth every morning.     . metFORMIN (GLUCOPHAGE) 850 MG tablet Take 850 mg by mouth 2 (two) times daily with a meal.     . ondansetron (ZOFRAN ODT) 4 MG disintegrating tablet Take 1 tablet (4 mg total) by mouth every 8 (eight) hours as needed for nausea or vomiting. 20 tablet 0  . PARoxetine (PAXIL) 40 MG tablet Take 40 mg by mouth every morning.     . Zolpidem Tartrate (AMBIEN PO) Take by mouth.    . lidocaine-prilocaine (EMLA) cream Apply 1 application topically as needed. Apply to port 1-2 hours prior to chemotherapy appointment. Cover with plastic wrap. (Patient not taking: Reported on  09/27/2016) 30 g 0  . oxyCODONE-acetaminophen (ROXICET) 5-325 MG tablet Take 1 tablet by mouth every 6 (six) hours as needed. (Patient not taking: Reported on 08/28/2016) 20 tablet 0  . palbociclib (IBRANCE) 125 MG capsule Take 1 capsule (125 mg total) by mouth daily with breakfast. For 21 days, with 7 days off. Take whole with food. 21 capsule 5   Current Facility-Administered Medications  Medication Dose Route Frequency Provider Last Rate Last Dose  . guaiFENesin-dextromethorphan (ROBITUSSIN DM) 100-10 MG/5ML syrup 5 mL  5 mL Oral Once Lloyd Huger, MD        OBJECTIVE: Vitals:   10/05/16 1014  BP: 111/76  Pulse: 87  Resp: 18  Temp: (!) 95.6 F (35.3 C)     Body mass index is 36.29 kg/m.    ECOG FS:0 -  Asymptomatic  General: Well-developed, well-nourished, no acute distress. Eyes: Pink conjunctiva, anicteric sclera. HEENT: No palpable lymphadenopathy. Breast: Left chest wall without evidence of recurrence.  Right breast and axilla without lumps or masses. Exam deferred today. Lungs: Clear to auscultation bilaterally. Heart: Regular rate and rhythm. No rubs, murmurs, or gallops. Abdomen: Soft, nontender, nondistended. No organomegaly noted, normoactive bowel sounds. Musculoskeletal: No edema, cyanosis, or clubbing. Neuro: Alert, answering all questions appropriately. Cranial nerves grossly intact. Skin: No rashes or petechiae noted. Psych: Normal affect.   LAB RESULTS:  Lab Results  Component Value Date   NA 138 06/26/2016   K 3.9 06/26/2016   CL 104 06/26/2016   CO2 27 06/26/2016   GLUCOSE 162 (H) 06/26/2016   BUN 24 (H) 06/26/2016   CREATININE 1.04 (H) 06/26/2016   CALCIUM 9.4 06/26/2016   PROT 7.2 06/26/2016   ALBUMIN 4.2 06/26/2016   AST 47 (H) 06/26/2016   ALT 35 06/26/2016   ALKPHOS 65 06/26/2016   BILITOT 0.6 06/26/2016   GFRNONAA 56 (L) 06/26/2016   GFRAA >60 06/26/2016    Lab Results  Component Value Date   WBC 3.4 (L) 08/07/2016   NEUTROABS  2.6 06/26/2016   HGB 12.7 08/07/2016   HCT 36.9 08/07/2016   MCV 91.6 08/07/2016   PLT 254 08/07/2016     STUDIES: Ct Soft Tissue Neck W Contrast  Result Date: 09/21/2016 CLINICAL DATA:  Inflammatory breast carcinoma.  Staging. EXAM: CT NECK WITH CONTRAST TECHNIQUE: Multidetector CT imaging of the neck was performed using the standard protocol following the bolus administration of intravenous contrast. CONTRAST:  147m ISOVUE-300 IOPAMIDOL (ISOVUE-300) INJECTION 61% COMPARISON:  None. FINDINGS: Pharynx and larynx: No mucosal or submucosal lesion. Salivary glands: Submandibular and parotid glands are normal. Thyroid: Normal Lymph nodes: No enlarged or low-density nodes on either side of the neck. Vascular: Right internal jugular venous catheter. No significant vascular finding. Limited intracranial: Negative Visualized orbits: Normal Mastoids and visualized paranasal sinuses: Clear Skeleton: Ordinary mid cervical spondylosis. Nonspecific 8 mm sclerotic region within the posterior aspect of the C4 vertebral body and smaller areas within the C7 vertebral body and C7 spinous process. These could represent metastatic deposits. No lytic lesions or extraosseous tumor identified. Upper chest: See results of chest CT. Other: None IMPRESSION: No evidence of soft tissue metastatic disease in the neck. Probable sclerotic bone metastases in the cervical spine, most prominent within the C4 vertebral body but also noted at C7. Electronically Signed   By: MNelson ChimesM.D.   On: 09/21/2016 13:08   Ct Chest W Contrast  Result Date: 09/21/2016 CLINICAL DATA:  Metastatic inflammatory left breast cancer. EXAM: CT CHEST, ABDOMEN, AND PELVIS WITH CONTRAST TECHNIQUE: Multidetector CT imaging of the chest, abdomen and pelvis was performed following the standard protocol during bolus administration of intravenous contrast. CONTRAST:  1233mISOVUE-300 IOPAMIDOL (ISOVUE-300) INJECTION 61% COMPARISON:  Multiple exams, including  06/26/2016 FINDINGS: CT CHEST FINDINGS Cardiovascular: Right central line tip:  Cavoatrial junction. Atherosclerotic calcification of the aortic arch and left anterior descending coronary artery with borderline cardiomegaly. Mediastinum/Nodes: No pathologic thoracic adenopathy identified. Lungs/Pleura: Postradiation therapy findings noted in the lingula including scarring. The prior density along the left hemidiaphragm has essentially resolved. There are new ground-glass nodular densities in both lower lobes, left greater than right, probably from alveolitis, but meriting surveillance Musculoskeletal: Right total shoulder arthroplasty. In widespread osseous metastatic disease, roughly similar distribution compared to the prior exam, although there is a new and possibly pathologic fracture of the right  fifth rib laterally with a small amount of associated periostitis on image 57/6. Similar size of the fluid collection at the left mastectomy site. Roughly similar stranding in the surrounding subcutaneous tissues without an obvious progressive soft tissue mass in this vicinity. CT ABDOMEN PELVIS FINDINGS Hepatobiliary: Unremarkable Pancreas: Unremarkable Spleen: 1.4 cm focus of accentuated enhancement peripherally in the spleen on image 49/3, unchanged from 10/08/2007 and not hypermetabolic on intervening PET CTs, indicating a benign process such as hemangioma. Adrenals/Urinary Tract: Adrenal glands normal. Left greater than right peripelvic cysts. Stomach/Bowel: Unremarkable Vascular/Lymphatic: Aortoiliac atherosclerotic vascular disease. Reproductive: Unremarkable Other: No supplemental non-categorized findings. Musculoskeletal: Stable scattered osseous metastatic disease without significant progression or enlargement identified. Lumbar spondylosis and degenerative disc disease causing impingement at multiple levels including L3-4, L4-5, and L5-S1. IMPRESSION: 1. Widespread but generally stable osseous metastatic  disease. The only change noted is a new likely pathologic fracture laterally in the right fifth rib. 2. Postoperative findings in the left breast are similar to prior. Post radiation therapy findings in the adjacent lung. 3. The prior nodular density along the left hemidiaphragm has resolved. However, there new ground-glass nodular densities in both lower lobes probably from alveolitis but warranting surveillance. 4.  Aortic Atherosclerosis (ICD10-I70.0).  Coronary atherosclerosis. 5. 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 6. Lumbar spondylosis and degenerative disc disease with lower lumbar impingement resulting. Electronically Signed   By: Van Clines M.D.   On: 09/21/2016 15:47   Nm Bone Scan Whole Body  Result Date: 09/21/2016 CLINICAL DATA:  Metastatic breast cancer. History of old fracture left wrist, left knee right shoulder replacements EXAM: NUCLEAR MEDICINE WHOLE BODY BONE SCAN TECHNIQUE: Whole body anterior and posterior images were obtained approximately 3 hours after intravenous injection of radiopharmaceutical. RADIOPHARMACEUTICALS:  24.51 mCi Technetium-76mMDP IV COMPARISON:  CT 09/21/2016, 06/26/2016, bone scan 07/21/2016, 12/27/1998 FINDINGS: Physiologic activity within the kidneys. Multifocal abnormal activity, consistent with diffuse skeletal metastatic disease. Increased foci of activity within the calvarium. Bilateral right greater than left shoulder activity re- demonstrated, slightly more intense on the left side. Increased sternal activity since the prior bone scan. Multiple foci of bilateral rib activity, slight increase in number and intensity of pre-existing lesions. Multiple foci of activity involving the thoracic and lumbar spine with intense activity at L1 re- demonstrated. Slight increased intensity of activity at T11-T12 with new or increasing activity at additional foci in the thoracic spine. Re- demonstrated foci of activity within the  bilateral femoral trochanters, ischial bones, and sacrum. Photopenia at the left knee and right shoulder corresponding to history of arthroplasty. Similar appearance of right greater than left foot and ankle activity presumably degenerative. IMPRESSION: Multifocal abnormal activity consistent with diffuse skeletal metastatic disease. New foci of activity seen within the calvarium, sternum, ribs and spine and increased intensity of several pre-existing metastatic foci concerning for progression of skeletal metastatic disease. Electronically Signed   By: KDonavan FoilM.D.   On: 09/21/2016 14:36   Ct Abdomen Pelvis W Contrast  Result Date: 09/21/2016 CLINICAL DATA:  Metastatic inflammatory left breast cancer. EXAM: CT CHEST, ABDOMEN, AND PELVIS WITH CONTRAST TECHNIQUE: Multidetector CT imaging of the chest, abdomen and pelvis was performed following the standard protocol during bolus administration of intravenous contrast. CONTRAST:  1279mISOVUE-300 IOPAMIDOL (ISOVUE-300) INJECTION 61% COMPARISON:  Multiple exams, including 06/26/2016 FINDINGS: CT CHEST FINDINGS Cardiovascular: Right central line tip:  Cavoatrial junction. Atherosclerotic calcification of the aortic arch and left anterior descending coronary artery with borderline cardiomegaly. Mediastinum/Nodes:  No pathologic thoracic adenopathy identified. Lungs/Pleura: Postradiation therapy findings noted in the lingula including scarring. The prior density along the left hemidiaphragm has essentially resolved. There are new ground-glass nodular densities in both lower lobes, left greater than right, probably from alveolitis, but meriting surveillance Musculoskeletal: Right total shoulder arthroplasty. In widespread osseous metastatic disease, roughly similar distribution compared to the prior exam, although there is a new and possibly pathologic fracture of the right fifth rib laterally with a small amount of associated periostitis on image 57/6. Similar size  of the fluid collection at the left mastectomy site. Roughly similar stranding in the surrounding subcutaneous tissues without an obvious progressive soft tissue mass in this vicinity. CT ABDOMEN PELVIS FINDINGS Hepatobiliary: Unremarkable Pancreas: Unremarkable Spleen: 1.4 cm focus of accentuated enhancement peripherally in the spleen on image 49/3, unchanged from 10/08/2007 and not hypermetabolic on intervening PET CTs, indicating a benign process such as hemangioma. Adrenals/Urinary Tract: Adrenal glands normal. Left greater than right peripelvic cysts. Stomach/Bowel: Unremarkable Vascular/Lymphatic: Aortoiliac atherosclerotic vascular disease. Reproductive: Unremarkable Other: No supplemental non-categorized findings. Musculoskeletal: Stable scattered osseous metastatic disease without significant progression or enlargement identified. Lumbar spondylosis and degenerative disc disease causing impingement at multiple levels including L3-4, L4-5, and L5-S1. IMPRESSION: 1. Widespread but generally stable osseous metastatic disease. The only change noted is a new likely pathologic fracture laterally in the right fifth rib. 2. Postoperative findings in the left breast are similar to prior. Post radiation therapy findings in the adjacent lung. 3. The prior nodular density along the left hemidiaphragm has resolved. However, there new ground-glass nodular densities in both lower lobes probably from alveolitis but warranting surveillance. 4.  Aortic Atherosclerosis (ICD10-I70.0).  Coronary atherosclerosis. 5. 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 6. Lumbar spondylosis and degenerative disc disease with lower lumbar impingement resulting. Electronically Signed   By: Van Clines M.D.   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 Now with biopsy proven stage IV disease with lymph node and  bone metastasis.  PLAN:    1.  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 After lengthy discussion with the patient she is agreed to proceed with treatment using Ibrance 125 mg for 21 days with 7 days off. She also will take daily letrozole as well as monthly Zometa. Return to clinic in 3 weeks to assess her toleration of treatment and initiation of Zometa. 2.  Osteopenia: Patient's bone mineral density on January 26, 2016 reported a T score of -0.9 which is considered normal. Continue calcium and vitamin D.  3. Anemia: Resolved. 4. Peripheral neuropathy: Continue gabapentin as prescribed. Neuropathy managed by neurology.  5. Back pain: Resolved.  6. Cough: Patient does not complain of this today. Continue Tussionex as needed.  Patient expressed understanding and was in agreement with this plan. She also understands that She can call clinic at any time with any questions, concerns, or complaints.   Breast cancer   Staging form: Breast, AJCC 7th Edition     Pathologic stage from 08/11/2014: Stage IIIA (T0, N2a, cM0) - Signed by Lloyd Huger, MD on 08/11/2014   Lloyd Huger, MD   10/10/2016 9:52 AM

## 2016-10-03 NOTE — Therapy (Signed)
[facility] [address], [place], [address] Phone: [phone]   Fax:  [phone]  Occupational Therapy Treatment  Patient Details  Name: Amanda [name] MRN: [id number] Date of Birth: 07/20/1950 Referring Provider: Grayland Ormond  Encounter Date: 10/03/2016      OT End of Session - 10/03/16 1408    Visit Number 4   Number of Visits 24   Date for OT Re-Evaluation 10/05/16   OT Start Time 1020   OT Stop Time 1055   OT Time Calculation (min) 35 min   Activity Tolerance Patient tolerated treatment well   Behavior During Therapy [facility] for tasks assessed/performed      Past Medical History:  Diagnosis Date  . Back pain    occasionally  . Breast cancer ([place])    left  . Depression    takes Paxil and Wellbutrin daily  . Diabetes ([place])    takes Metformin daily  . GERD (gastroesophageal reflux disease)   . History of bronchitis 2 yrs ago  . Hyperlipidemia    takes Atorvastatin daily  . Hypertension    takes Losartan-HCTZ daily  . Insomnia    takes Ambien nightly  . Insomnia    takes gabapentin nightly  . Joint pain   . Migraine   . Mood swings ([place])   . Osteoarthritis of knee   . PONV (postoperative nausea and vomiting)   . Seasonal allergies    takes Allegra daily    Past Surgical History:  Procedure Laterality Date  . BREAST BIOPSY  2009  . cataract surgery Bilateral   . COLONOSCOPY    . JOINT REPLACEMENT Left 2014   knee  . KNEE ARTHROSCOPY Left    x 5  . MASTECTOMY Left   . port a cath placed    . PORTACATH PLACEMENT N/A 09/17/2015   Procedure: INSERTION PORT-A-CATH;  Surgeon: Jules Husbands, MD;  Location: [facility];  Service: General;  Laterality: N/A;  . TOTAL SHOULDER ARTHROPLASTY Right 06/03/2015   Procedure: TOTAL SHOULDER ARTHROPLASTY;  Surgeon: Tania Ade, MD;  Location: [place];  Service: Orthopedics;  Laterality: Right;  Right total shoulder arthroplasty  . TOTAL SHOULDER  REPLACEMENT Right 06/03/2015  . TUBAL LIGATION      There were no vitals filed for this visit.      Subjective Assessment - 10/03/16 1405    Subjective  I did find camisole that works very well - but cannot get myself the jovipak breast pad in - the back part my husband has to help - I did wear it every day - and tubi grip on arm    Patient Stated Goals Patient reports she wants to control the swelling in her left arm, be able to take care of herself.    Currently in Pain? No/denies             LYMPHEDEMA/ONCOLOGY QUESTIONNAIRE - 10/03/16 1031      Left Upper Extremity Lymphedema   15 cm Proximal to Olecranon Process 43 cm   10 cm Proximal to Olecranon Process 42 cm   Olecranon Process 29 cm   15 cm Proximal to Ulnar Styloid Process 28.5 cm   10 cm Proximal to Ulnar Styloid Process 25 cm   Just Proximal to Ulnar Styloid Process 18.1 cm      Pt report she got good fitting camisole that keeps jovipak breast pad in place but cannot get it in herself - husband travels  Pt to sew it in temporary or use stick on velcro   Measurements taken  See flow sheet  discuss compression and garments  Daytime compression sleeve donn using slippy gator - pt ed on donning and doffing garment correctly -and wearing  Still increase in upper arm compare to R side and in earlier this year prior to radiation on thoracic spine                       OT Education - 10/03/16 1408    Education provided Yes   Education Details ed on donning and doffing of daytime compression and jovipak breast pad    Person(s) Educated Patient   Methods Explanation;Demonstration;Tactile cues;Verbal cues   Comprehension Verbal cues required;Returned demonstration;Verbalized understanding             OT Long Term Goals - 09/20/16 1055      OT LONG TERM GOAL #1   Title Patient will be independent in wear, care and schedule of compression garments.    Baseline not currently wearing sleeve or  gauntlet, just compression bra at times.    Time 6   Period Weeks   Status On-going     OT LONG TERM GOAL #2   Title Patient will demonstrate HEP with modified independence.    Baseline not currently participating in HEP per pt    Time 6   Period Weeks   Status On-going     OT LONG TERM GOAL #3   Title Patient will decrease circumferential measurements by 1.5 cm from mid forearm to upper arm to fit into compression garment and for maintenace of lymphedema symptoms.    Baseline see flow sheet for measurements   Time 6   Period Weeks   Status On-going               Plan - 10/03/16 1453    Clinical Impression Statement Pt did get good fitting camisole to wear to keep jovipak breast pad in place  - pt to wear as much as she can because of upper arm still increase 3-4 cm - was able to fit pt with daytime compression sleeve using slippy gator - pt to wear daytime compression every day until next appt and will reassess upper arm at that time    Occupational performance deficits (Please refer to evaluation for details): ADL's;IADL's;Social Participation;Rest and Sleep   Rehab Potential Good   Current Impairments/barriers affecting progress: metastatic disease.  Husband travels for work for extended periods of time in which patient is alone.    OT Frequency 1x / week   OT Duration 4 weeks   OT Treatment/Interventions Self-care/ADL training;DME and/or AE instruction;Manual lymph drainage;Patient/family education;Compression bandaging;Therapeutic exercises;Scar mobilization;Passive range of motion;Manual Therapy   Clinical Decision Making Several treatment options, min-mod task modification necessary   OT Home Exercise Plan see pt instructions    Consulted and Agree with Plan of Care Patient      Patient will benefit from skilled therapeutic intervention in order to improve the following deficits and impairments:  Decreased range of motion, Increased edema, Decreased scar mobility,  Pain, Impaired UE functional use, Decreased strength  Visit Diagnosis: Postmastectomy lymphedema  Left shoulder pain, unspecified chronicity  Stiffness of left shoulder, not elsewhere classified  Disorder of the skin and subcutaneous tissue related to radiation, unspecified  Other symptoms and signs involving the musculoskeletal system    Problem List Patient Active Problem List   Diagnosis Date Noted  .  Metastatic breast cancer ([place]) 09/24/2015  . 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 Personal history of breast cancer 08/23/2015  . 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 Degenerative arthritis of right shoulder region 01/13/2015  . Cancer of left female breast  ([place]) 07/27/2014  . 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 Rotator cuff tear 06/10/2014  . Allergic rhinitis, seasonal 09/16/2013  . Depression 08/10/2013  . Diabetes mellitus type 2, uncomplicated ([place]) 19/16/6060  . Hyperlipidemia, unspecified 08/10/2013  . BP (high blood pressure) 08/10/2013  . 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, [place], [address] Phone: [phone]   Fax:  [phone]  Name: Amanda [name] MRN: [id number] Date of Birth: [birth year]

## 2016-10-04 ENCOUNTER — Ambulatory Visit: Payer: 59 | Admitting: Oncology

## 2016-10-05 ENCOUNTER — Inpatient Hospital Stay (HOSPITAL_BASED_OUTPATIENT_CLINIC_OR_DEPARTMENT_OTHER): Payer: 59 | Admitting: Oncology

## 2016-10-05 VITALS — BP 111/76 | HR 87 | Temp 95.6°F | Resp 18 | Wt 211.4 lb

## 2016-10-05 DIAGNOSIS — C7951 Secondary malignant neoplasm of bone: Secondary | ICD-10-CM | POA: Diagnosis not present

## 2016-10-05 DIAGNOSIS — Z923 Personal history of irradiation: Secondary | ICD-10-CM

## 2016-10-05 DIAGNOSIS — I700 Atherosclerosis of aorta: Secondary | ICD-10-CM

## 2016-10-05 DIAGNOSIS — Z79811 Long term (current) use of aromatase inhibitors: Secondary | ICD-10-CM | POA: Diagnosis not present

## 2016-10-05 DIAGNOSIS — M2550 Pain in unspecified joint: Secondary | ICD-10-CM

## 2016-10-05 DIAGNOSIS — Z452 Encounter for adjustment and management of vascular access device: Secondary | ICD-10-CM

## 2016-10-05 DIAGNOSIS — M899 Disorder of bone, unspecified: Secondary | ICD-10-CM | POA: Diagnosis not present

## 2016-10-05 DIAGNOSIS — Z170 Estrogen receptor positive status [ER+]: Secondary | ICD-10-CM | POA: Diagnosis not present

## 2016-10-05 DIAGNOSIS — Z79899 Other long term (current) drug therapy: Secondary | ICD-10-CM

## 2016-10-05 DIAGNOSIS — M1990 Unspecified osteoarthritis, unspecified site: Secondary | ICD-10-CM

## 2016-10-05 DIAGNOSIS — F329 Major depressive disorder, single episode, unspecified: Secondary | ICD-10-CM

## 2016-10-05 DIAGNOSIS — K219 Gastro-esophageal reflux disease without esophagitis: Secondary | ICD-10-CM

## 2016-10-05 DIAGNOSIS — E785 Hyperlipidemia, unspecified: Secondary | ICD-10-CM

## 2016-10-05 DIAGNOSIS — G4700 Insomnia, unspecified: Secondary | ICD-10-CM

## 2016-10-05 DIAGNOSIS — Z7984 Long term (current) use of oral hypoglycemic drugs: Secondary | ICD-10-CM

## 2016-10-05 DIAGNOSIS — C50912 Malignant neoplasm of unspecified site of left female breast: Secondary | ICD-10-CM | POA: Diagnosis not present

## 2016-10-05 DIAGNOSIS — I10 Essential (primary) hypertension: Secondary | ICD-10-CM

## 2016-10-05 DIAGNOSIS — I2510 Atherosclerotic heart disease of native coronary artery without angina pectoris: Secondary | ICD-10-CM

## 2016-10-05 DIAGNOSIS — G629 Polyneuropathy, unspecified: Secondary | ICD-10-CM | POA: Diagnosis not present

## 2016-10-05 DIAGNOSIS — C779 Secondary and unspecified malignant neoplasm of lymph node, unspecified: Secondary | ICD-10-CM | POA: Diagnosis not present

## 2016-10-05 DIAGNOSIS — E119 Type 2 diabetes mellitus without complications: Secondary | ICD-10-CM

## 2016-10-05 DIAGNOSIS — C50919 Malignant neoplasm of unspecified site of unspecified female breast: Secondary | ICD-10-CM

## 2016-10-05 DIAGNOSIS — R0781 Pleurodynia: Secondary | ICD-10-CM

## 2016-10-05 DIAGNOSIS — Z8669 Personal history of other diseases of the nervous system and sense organs: Secondary | ICD-10-CM

## 2016-10-05 DIAGNOSIS — M8580 Other specified disorders of bone density and structure, unspecified site: Secondary | ICD-10-CM

## 2016-10-05 MED ORDER — PALBOCICLIB 125 MG PO CAPS: 125.0000 mg | ORAL_CAPSULE | Freq: Every day | ORAL | 5 refills | Status: DC

## 2016-10-05 NOTE — Progress Notes (Signed)
Patient denies any concerns today.  

## 2016-10-06 ENCOUNTER — Telehealth: Payer: Self-pay | Admitting: Pharmacist

## 2016-10-06 DIAGNOSIS — C50919 Malignant neoplasm of unspecified site of unspecified female breast: Secondary | ICD-10-CM

## 2016-10-06 MED ORDER — PALBOCICLIB 125 MG PO CAPS: 125.0000 mg | ORAL_CAPSULE | Freq: Every day | ORAL | 5 refills | Status: DC

## 2016-10-06 NOTE — Telephone Encounter (Signed)
Oral Oncology Patient Advocate Encounter  Received notification from [employer] that prior authorization for [name] is required.  PA submitted on CoverMyMeds Key [id number] Status is pending  Additionally, the insurance company requires that the prescriptions be filled by [facility].    Fabio Asa. Melynda Keller, [place] Patient [place] [phone] 10/06/2016 1:37 PM

## 2016-10-06 NOTE — Telephone Encounter (Signed)
Oral Oncology Pharmacist Encounter  Received new prescription for Ibrance for the treatment of metastatic breast cancer in conjunction with Letrozole, planned duration until disease progression or unacceptable drug toxicity.  CMP from 09/06/16 ([place]) and CBC from 08/07/16 assessed, no relevant lab abnormalities. Prescription dose and frequency assessed.   Current medication list in Epic reviewed, no DDIs with relevant identified.   Prescription has been e-scribed to the [facility] for benefits analysis and approval.  Oral Oncology Clinic will continue to follow for insurance authorization, copayment issues, initial counseling and start date.  Darl Pikes, PharmD, BCPS Hematology/Oncology Clinical Pharmacist [facility] [place] [facility] [phone]  10/06/2016 9:48 AM

## 2016-10-09 NOTE — Telephone Encounter (Signed)
Oral Chemotherapy Pharmacist Encounter  Received call from patient  Asking about status of her Ibrance prescription. I let her know that we are currently awaiting her insurance company to approved her PA.  We will give her a call when it is approved.  Darl Pikes, PharmD, BCPS Hematology/Oncology Clinical Pharmacist [facility] [phone]  10/09/2016 10:10 AM

## 2016-10-10 MED ORDER — PALBOCICLIB 125 MG PO CAPS: 125.0000 mg | ORAL_CAPSULE | Freq: Every day | ORAL | 0 refills | Status: DC

## 2016-10-10 NOTE — Telephone Encounter (Addendum)
Oral Oncology Patient Advocate Encounter  Prior Authorization for [name] has been approved.    PA# Approved Effective dates: 10/08/2016 through 10/08/2017   Called patient to let her know her [name] was approved and sent to [facility]. To be expecting a call from them.  Oral Oncology Clinic will continue to follow.    [place] Patient Advocate [phone] 10/10/2016 3:11 PM

## 2016-10-10 NOTE — Telephone Encounter (Signed)
Oral Chemotherapy Pharmacist Encounter  Per insurance requirement, medication was e-scribed to [facility].   Will f/u medication shipment to patient.   Will attempt to fill RX with [facility] after 10/16/16 when patient's insurance changes over to Medicare.   Thank you,  Darl Pikes, PharmD, BCPS Hematology/Oncology Clinical Pharmacist [facility] [phone]  10/10/2016 5:00 PM

## 2016-10-12 ENCOUNTER — Telehealth: Payer: Self-pay | Admitting: Pharmacist

## 2016-10-12 ENCOUNTER — Ambulatory Visit: Payer: 59 | Admitting: Occupational Therapy

## 2016-10-12 ENCOUNTER — Telehealth: Payer: Self-pay | Admitting: *Deleted

## 2016-10-12 DIAGNOSIS — I972 Postmastectomy lymphedema syndrome: Secondary | ICD-10-CM | POA: Diagnosis not present

## 2016-10-12 NOTE — Patient Instructions (Addendum)
Cont jovipak breast pad for day or night time  Daytime compression sleeve and gauntlet during day  And night time one piece tubular padding soft - size small

## 2016-10-12 NOTE — Therapy (Signed)
[facility] [address], [place], [address] Phone: [phone]   Fax:  [phone]  Occupational Therapy Treatment  Patient Details  Name: Amanda [name] MRN: [id number] Date of Birth: 1950/10/15 Referring Provider: Grayland Ormond  Encounter Date: 10/12/2016      OT End of Session - 10/12/16 1224    Visit Number 5   Number of Visits 24   Date for OT Re-Evaluation 11/09/16   OT Start Time 0938   OT Stop Time 1014   OT Time Calculation (min) 36 min   Activity Tolerance Patient tolerated treatment well   Behavior During Therapy [facility] for tasks assessed/performed      Past Medical History:  Diagnosis Date  . Back pain    occasionally  . Breast cancer ([place])    left  . Depression    takes Paxil and Wellbutrin daily  . Diabetes ([place])    takes Metformin daily  . GERD (gastroesophageal reflux disease)   . History of bronchitis 2 yrs ago  . Hyperlipidemia    takes Atorvastatin daily  . Hypertension    takes Losartan-HCTZ daily  . Insomnia    takes Ambien nightly  . Insomnia    takes gabapentin nightly  . Joint pain   . Migraine   . Mood swings ([place])   . Osteoarthritis of knee   . PONV (postoperative nausea and vomiting)   . Seasonal allergies    takes Allegra daily    Past Surgical History:  Procedure Laterality Date  . BREAST BIOPSY  2009  . cataract surgery Bilateral   . COLONOSCOPY    . JOINT REPLACEMENT Left 2014   knee  . KNEE ARTHROSCOPY Left    x 5  . MASTECTOMY Left   . port a cath placed    . PORTACATH PLACEMENT N/A 09/17/2015   Procedure: INSERTION PORT-A-CATH;  Surgeon: Jules Husbands, MD;  Location: [facility];  Service: General;  Laterality: N/A;  . TOTAL SHOULDER ARTHROPLASTY Right 06/03/2015   Procedure: TOTAL SHOULDER ARTHROPLASTY;  Surgeon: Tania Ade, MD;  Location: [place];  Service: Orthopedics;  Laterality: Right;  Right total shoulder arthroplasty  . TOTAL SHOULDER  REPLACEMENT Right 06/03/2015  . TUBAL LIGATION      There were no vitals filed for this visit.      Subjective Assessment - 10/12/16 1115    Subjective  I did get the jovipak breast pad stick onto camisole - now  I wear it every day -  Idid bring my compression sleeves for daytime - I got them same time earlier this year but the one is tighter and gauntlet - they just authorized my meds for [place] - taking for 3 wks and then 7 days off - then infusion    Patient Stated Goals Patient reports she wants to control the swelling in her left arm, be able to take care of herself.    Currently in Pain? No/denies             LYMPHEDEMA/ONCOLOGY QUESTIONNAIRE - 10/12/16 0946      Left Upper Extremity Lymphedema   15 cm Proximal to Olecranon Process 43 cm   10 cm Proximal to Olecranon Process 41.5 cm   Olecranon Process 28.5 cm   15 cm Proximal to Ulnar Styloid Process 28.8 cm   10 cm Proximal to Ulnar Styloid Process 25 cm   Just Proximal to Ulnar Styloid Process 17.4 cm   Across Hand at  Thumb Web Space 20.2 cm   At [place] of 2nd Digit 7.1 cm   At [facility] of Thumb 7 cm       pt arrive with jovipak in place under camisole - pt modified it with velcro - so she can donn and wear it independent  While husband work out of town  Report that she had 2 pairs of compression sleeve for daytime and gauntlet - but one pair tighter or smaller  Able to donn - one that is little looser - pt ed again on donning and wearing it correctly  MEasurements for circumference measured - see flowsheet  L UE elbow and upper arm did decrease compare to last time  Pt to cont with HEP and return in 2 wks  Fitted with one piece tubular soft bandage - small to use at night time                   OT Education - 10/12/16 1223    Education provided Yes   Education Details use of compression garments at home    Person(s) Educated Patient   Methods Explanation;Demonstration;Tactile cues   Comprehension  Returned demonstration;Verbalized understanding             OT Long Term Goals - 10/12/16 1227      OT LONG TERM GOAL #1   Title Patient will be independent in wear, care and schedule of compression garments.    Baseline  wearing sleeve and gauntlet, fitted with tubular sock soft small for night time   Time 4   Period Weeks   Status On-going     OT LONG TERM GOAL #2   Title Patient will demonstrate HEP with modified independence.    Status Achieved     OT LONG TERM GOAL #3   Title Patient will decrease circumferential measurements by 1.5 cm from mid forearm to upper arm to fit into compression garment and for maintenace of lymphedema symptoms.    Baseline improving see flowsheet    Time 6   Period Weeks   Status On-going               Plan - 10/12/16 1225    Clinical Impression Statement Pt did show decrease circumference in elbow and upper arm compare to last time - pt did get 2 daytime compression garments with gauntlets earlier this year - one pair fits better than one - pt to wash and stretch tighter one - and use the one - ed on donning and wearing - soft tubular soft bandage provided for night time and cont with jovipak breast pad for night time - follow up in 2 wks    Occupational performance deficits (Please refer to evaluation for details): ADL's;IADL's;Social Participation;Rest and Sleep   Current Impairments/barriers affecting progress: metastatic disease.  Husband travels for work for extended periods of time in which patient is alone.    OT Frequency Biweekly   OT Duration 6 weeks   OT Treatment/Interventions Self-care/ADL training;DME and/or AE instruction;Manual lymph drainage;Patient/family education;Compression bandaging;Therapeutic exercises;Scar mobilization;Passive range of motion;Manual Therapy   Clinical Decision Making Limited treatment options, no task modification necessary   OT Home Exercise Plan see pt instructions    Consulted and Agree with  Plan of Care Patient      Patient will benefit from skilled therapeutic intervention in order to improve the following deficits and impairments:  Decreased range of motion, Increased edema, Decreased scar mobility, Pain, Impaired UE functional use, Decreased strength  Visit Diagnosis: Postmastectomy lymphedema - Plan: Ot plan of care cert/re-cert    Problem List Patient Active Problem List   Diagnosis Date Noted  . Malignant neoplasm of left breast in female, estrogen receptor positive ([place]) 10/05/2016  . Metastatic breast cancer ([place]) 09/24/2015  . 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 Personal history of breast cancer 08/23/2015  . 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 Degenerative arthritis of right shoulder region 01/13/2015  . Cancer of left female breast  ([place]) 07/27/2014  . 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 Rotator cuff tear 06/10/2014  . Allergic rhinitis, seasonal 09/16/2013  . Depression 08/10/2013  . Diabetes mellitus type 2, uncomplicated ([place]) 01/74/9449  . Hyperlipidemia, unspecified 08/10/2013  . BP (high blood pressure) 08/10/2013  . 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 [place], [address] Phone: [phone]   Fax:  [phone]  Name: SHERL YZAGUIRRE MRN: [id number] Date of Birth: Feb 02, 1950

## 2016-10-12 NOTE — Telephone Encounter (Signed)
Patient called requesting further information regarding her IBRANCE start date and also whether her appointments and further treatment would be affected.

## 2016-10-12 NOTE — Telephone Encounter (Signed)
Oral Chemotherapy Pharmacist Encounter  I spoke with patient for overview of new oral chemotherapy medication: Ibrance for the treatment of metastatic breast cancer, planned duration until disease progression or unacceptable drug toxicity.   Pt is doing well. She will receive her medication in the mail tomorrow 9/28. She knows to start her [name] when she receives it.   Counseled patient on administration, dosing, side effects, monitoring, drug-food interactions, safe handling, storage, and disposal. Patient will take 1 capsule (125 mg total) by mouth daily with breakfast. For 21 days, with 7 days off. Take whole with food.  Side effects include but not limited to: N/V/D, rash, fatigue, neutropenia.    Reviewed with patient importance of keeping a medication schedule and plan for any missed doses.  Amanda [name] voiced understanding and appreciation. All questions answered.  Provided patient with [facility] phone number. Patient knows to call the office with questions or concerns. Oral Chemotherapy Navigation Clinic will continue to follow.  Thank you,  Darl Pikes, PharmD, BCPS Hematology/Oncology Clinical Pharmacist [facility] [phone]  10/12/2016 5:57 PM

## 2016-10-12 NOTE — Telephone Encounter (Signed)
Oral Chemotherapy Pharmacist Encounter  Spoke with Amanda [name] and let her know to start the Ibrance as soon as she receives it tomorrow. I also let her know that her appts would be pushed out to reflect that she is starting the medication tomorrow.  Thank you,  Darl Pikes, PharmD, BCPS Hematology/Oncology Clinical Pharmacist [facility] [phone]  10/12/2016 4:00 PM

## 2016-10-19 NOTE — Telephone Encounter (Deleted)
Oral Oncology Patient Advocate Encounter  Received notification from [facility] that prior authorization for [name] is required.  PA submitted on CoverMyMeds Key [id number] Status is pending  Oral Oncology Clinic will continue to follow.  Emily did PA.

## 2016-10-23 ENCOUNTER — Other Ambulatory Visit: Payer: Self-pay | Admitting: *Deleted

## 2016-10-23 DIAGNOSIS — C50919 Malignant neoplasm of unspecified site of unspecified female breast: Secondary | ICD-10-CM

## 2016-10-23 MED ORDER — PALBOCICLIB 125 MG PO CAPS: 125.0000 mg | ORAL_CAPSULE | Freq: Every day | ORAL | 0 refills | Status: DC

## 2016-10-25 ENCOUNTER — Ambulatory Visit: Payer: Medicare Other | Attending: Oncology | Admitting: Occupational Therapy

## 2016-10-25 ENCOUNTER — Telehealth: Payer: Self-pay | Admitting: Oncology

## 2016-10-25 DIAGNOSIS — R29898 Other symptoms and signs involving the musculoskeletal system: Secondary | ICD-10-CM | POA: Diagnosis not present

## 2016-10-25 DIAGNOSIS — L599 Disorder of the skin and subcutaneous tissue related to radiation, unspecified: Secondary | ICD-10-CM | POA: Diagnosis not present

## 2016-10-25 DIAGNOSIS — I972 Postmastectomy lymphedema syndrome: Secondary | ICD-10-CM | POA: Diagnosis not present

## 2016-10-25 NOTE — Telephone Encounter (Signed)
.  Oral Oncology Patient Advocate Encounter  Met patient in lobby to complete application for [employer] in an effort to reduce patient's out of pocket expense for Ibrance to $0.    Copay is $887.57  Application completed and faxed to [phone].   Patient assistance phone number for follow up is [phone].   This encounter will be updated until final determination.   [place] Patient Advocate [phone] 10/25/2016 10:59 AM

## 2016-10-25 NOTE — Patient Instructions (Signed)
Cont to do same wearing of garments - Rosidal foam provided to decrease rolling of soft tubigrip at night time

## 2016-10-25 NOTE — Therapy (Signed)
[place] [facility] [address], [place], [address] Phone: [phone]   Fax:  [phone]  Occupational Therapy Treatment  Patient Details  Name: Amanda [name] MRN: [id number] Date of Birth: 1950-12-03 Referring Provider: Grayland Ormond  Encounter Date: 10/25/2016      OT End of Session - 10/25/16 1058    Visit Number 6   Number of Visits 24   Date for OT Re-Evaluation 11/09/16   OT Start Time 1001   OT Stop Time 1042   OT Time Calculation (min) 41 min   Activity Tolerance Patient tolerated treatment well   Behavior During Therapy [facility] for tasks assessed/performed      Past Medical History:  Diagnosis Date  . Back pain    occasionally  . Breast cancer ([place])    left  . Depression    takes Paxil and Wellbutrin daily  . Diabetes ([place])    takes Metformin daily  . GERD (gastroesophageal reflux disease)   . History of bronchitis 2 yrs ago  . Hyperlipidemia    takes Atorvastatin daily  . Hypertension    takes Losartan-HCTZ daily  . Insomnia    takes Ambien nightly  . Insomnia    takes gabapentin nightly  . Joint pain   . Migraine   . Mood swings ([place])   . Osteoarthritis of knee   . PONV (postoperative nausea and vomiting)   . Seasonal allergies    takes Allegra daily    Past Surgical History:  Procedure Laterality Date  . BREAST BIOPSY  2009  . cataract surgery Bilateral   . COLONOSCOPY    . JOINT REPLACEMENT Left 2014   knee  . KNEE ARTHROSCOPY Left    x 5  . MASTECTOMY Left   . port a cath placed    . PORTACATH PLACEMENT N/A 09/17/2015   Procedure: INSERTION PORT-A-CATH;  Surgeon: Jules Husbands, MD;  Location: [facility];  Service: General;  Laterality: N/A;  . TOTAL SHOULDER ARTHROPLASTY Right 06/03/2015   Procedure: TOTAL SHOULDER ARTHROPLASTY;  Surgeon: Tania Ade, MD;  Location: [facility];  Service: Orthopedics;  Laterality: Right;  Right total shoulder arthroplasty  . TOTAL SHOULDER  REPLACEMENT Right 06/03/2015  . TUBAL LIGATION      There were no vitals filed for this visit.      Subjective Assessment - 10/25/16 1008    Subjective  I started end of last week the chemo drug- no side effect yet - sometimes my L shoulder hurts - I  sleep with jovipak but to hot during day    Patient Stated Goals Patient reports she wants to control the swelling in her left arm, be able to take care of herself.              LYMPHEDEMA/ONCOLOGY QUESTIONNAIRE - 10/25/16 1015      Left Upper Extremity Lymphedema   15 cm Proximal to Olecranon Process 42.5 cm   10 cm Proximal to Olecranon Process 41.5 cm   Olecranon Process 28.6 cm   15 cm Proximal to Ulnar Styloid Process 28 cm   10 cm Proximal to Ulnar Styloid Process 24.5 cm   Just Proximal to Ulnar Styloid Process 17 cm   Across Hand at [employer] 20.5 cm   At [place] of 2nd Digit 7 cm   At [facility] of Thumb 7 cm      Pt wearing compression sleeve coming in   no complains except need new  type of donner for daytime garments - showed and demo juzo slippy gator for arm - info provided  Rosidal foam piece provided to prevent soft tubi grip to roll at night time- ed on using - see if help  Measurements taken - see flowsheet -  Pt to cont with home program for month this time and  Will reassess  Jovipak during day -if gets cooler can try and wear it at daytime again to clear trunkle lymphedema                    OT Education - 10/25/16 1058    Education provided Yes   Education Details use of slippy gator - where to get new one - night time wear of soft tubigrip    Person(s) Educated Patient   Methods Explanation;Demonstration;Tactile cues;Verbal cues   Comprehension Verbal cues required;Returned demonstration;Verbalized understanding             OT Long Term Goals - 10/12/16 1227      OT LONG TERM GOAL #1   Title Patient will be independent in wear, care and schedule of compression garments.     Baseline  wearing sleeve and gauntlet, fitted with tubular sock soft small for night time   Time 4   Period Weeks   Status On-going     OT LONG TERM GOAL #2   Title Patient will demonstrate HEP with modified independence.    Status Achieved     OT LONG TERM GOAL #3   Title Patient will decrease circumferential measurements by 1.5 cm from mid forearm to upper arm to fit into compression garment and for maintenace of lymphedema symptoms.    Baseline improving see flowsheet    Time 6   Period Weeks   Status On-going               Plan - 10/25/16 1059    Clinical Impression Statement Assess L UE - no fibrosis felt - measurements decrease in wrist to forearm - elbow and upper arm same but decrease 2 wks ago - proximal upper arm also decrease - pt to cont wearing soft tubigrip at night time    Occupational performance deficits (Please refer to evaluation for details): ADL's;IADL's;Rest and Sleep   Rehab Potential Good   Current Impairments/barriers affecting progress: metastatic disease.  Husband travels for work for extended periods of time in which patient is alone.    OT Frequency Monthly   OT Duration 4 weeks   OT Treatment/Interventions Self-care/ADL training;DME and/or AE instruction;Manual lymph drainage;Patient/family education;Compression bandaging;Therapeutic exercises;Scar mobilization;Passive range of motion;Manual Therapy   Clinical Decision Making Limited treatment options, no task modification necessary   OT Home Exercise Plan see pt instructions    Consulted and Agree with Plan of Care Patient      Patient will benefit from skilled therapeutic intervention in order to improve the following deficits and impairments:  Decreased range of motion, Increased edema, Decreased scar mobility, Pain, Impaired UE functional use, Decreased strength  Visit Diagnosis: Postmastectomy lymphedema  Disorder of the skin and subcutaneous tissue related to radiation,  unspecified  Other symptoms and signs involving the musculoskeletal system    Problem List Patient Active Problem List   Diagnosis Date Noted  . Malignant neoplasm of left breast in female, estrogen receptor positive ([place]) 10/05/2016  . Metastatic breast cancer ([place]) 09/24/2015  . 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 Personal history of breast cancer 08/23/2015  . Status  post total shoulder arthroplasty 06/03/2015  . Degenerative arthritis of right shoulder region 01/13/2015  . Cancer of left female breast  ([place]) 07/27/2014  . 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 Rotator cuff tear 06/10/2014  . Allergic rhinitis, seasonal 09/16/2013  . Depression 08/10/2013  . Diabetes mellitus type 2, uncomplicated ([place]) 46/56/8127  . Hyperlipidemia, unspecified 08/10/2013  . BP (high blood pressure) 08/10/2013  . Osteoporosis, post-menopausal 08/10/2013    Rosalyn Gess OTR/L,CLT 10/25/2016, 11:07 AM  [place] [facility] [address], [place], [address] Phone: [phone]   Fax:  [phone]  Name: Amanda [name] MRN: [id number] Date of Birth: 1950/09/07

## 2016-10-26 ENCOUNTER — Other Ambulatory Visit: Payer: 59

## 2016-10-26 ENCOUNTER — Ambulatory Visit: Payer: 59

## 2016-10-26 ENCOUNTER — Ambulatory Visit: Payer: 59 | Admitting: Oncology

## 2016-10-30 ENCOUNTER — Inpatient Hospital Stay: Payer: 59

## 2016-10-30 NOTE — Telephone Encounter (Signed)
Oral Chemotherapy Pharmacist Encounter  Received call from patient letting me know that [employer] called at told her his needed a refill. Patient was applying for manufacturer assistance. I will call [employer] and notify them of this.  Thank you,  Darl Pikes, PharmD, BCPS Hematology/Oncology Clinical Pharmacist [facility] [phone]  10/30/2016 11:50 AM

## 2016-10-30 NOTE — Telephone Encounter (Signed)
Oral Oncology Patient Advocate Encounter  Received notification from [place] Patient Assistance program that patient has been successfully enrolled into their program to receive Ibrance from the manufacturer at $0 out of pocket until 01/15/2017. Asked them to hold medication until lab work on Friday to make sure dosage is still the same. Will call them on Friday.   I called and spoke with patient.  She knows we will have to re-apply.   Patient knows to call the office with questions or concerns.  Oral Oncology Clinic will continue to follow.  [place] Patient Advocate [phone] 10/30/2016 12:12 PM

## 2016-10-31 NOTE — Progress Notes (Signed)
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 Now with biopsy-proven stage IV disease.  INTERVAL HISTORY: Patient returns to clinic today for further evaluation, to assess her toleration of Ibrance and continuation of Zometa. She is tolerating her treatment without significant side effects. She continues to have neuropathy in her fingertips and feet which is unchanged. She otherwise feels well and is asymptomatic. She has no other neurologic complaints. She denies any pain. She denies any recent fevers or illnesses.  She denies any chest pain or shortness of breath. She denies any nausea, vomiting, constipation, or diarrhea.  She has no urinary complaints.  Patient offers no further specific complaints today.  REVIEW OF SYSTEMS:   Review of Systems  Constitutional: Negative.  Negative for fever, malaise/fatigue and weight loss.  Eyes: Negative.  Negative for blurred vision.  Respiratory: Negative.  Negative for cough and shortness of breath.   Cardiovascular: Negative.  Negative for chest pain and leg swelling.  Gastrointestinal: Negative.  Negative for abdominal pain.  Genitourinary: Negative.   Musculoskeletal: Negative for back pain.  Neurological: Positive for sensory change. Negative for weakness.  Endo/Heme/Allergies: Negative.   Psychiatric/Behavioral: Negative.  Negative for memory loss. The patient is not nervous/anxious.     As per HPI. Otherwise, a complete review of systems is negative.  PAST MEDICAL HISTORY: Past Medical History:  Diagnosis Date  . Back pain    occasionally  . Breast cancer ([place])    left  . Depression      takes Paxil and Wellbutrin daily  . Diabetes ([place])    takes Metformin daily  . GERD (gastroesophageal reflux disease)   . History of bronchitis 2 yrs ago  . Hyperlipidemia    takes Atorvastatin daily  . Hypertension    takes Losartan-HCTZ daily  . Insomnia    takes Ambien nightly  . Insomnia    takes gabapentin nightly  . Joint pain   . Migraine   . Mood swings ([place])   . Osteoarthritis of knee   . PONV (postoperative nausea and vomiting)   . Seasonal allergies    takes Allegra daily    PAST SURGICAL HISTORY: Past Surgical History:  Procedure Laterality Date  . BREAST BIOPSY  2009  . cataract surgery Bilateral   . COLONOSCOPY    . JOINT REPLACEMENT Left 2014   knee  . KNEE ARTHROSCOPY Left    x 5  . MASTECTOMY Left   . port a cath placed    . PORTACATH PLACEMENT N/A 09/17/2015   Procedure: INSERTION PORT-A-CATH;  Surgeon: Jules Husbands, MD;  Location: [facility];  Service: General;  Laterality: N/A;  . TOTAL SHOULDER ARTHROPLASTY Right 06/03/2015   Procedure: TOTAL SHOULDER ARTHROPLASTY;  Surgeon: Tania Ade, MD;  Location: [facility];  Service: Orthopedics;  Laterality: Right;  Right total shoulder arthroplasty  . TOTAL SHOULDER REPLACEMENT Right 06/03/2015  . TUBAL LIGATION      FAMILY HISTORY: Father with non-Hodgkin's lymphoma, 2 paternal aunts with breast cancer.     ADVANCED DIRECTIVES:    HEALTH MAINTENANCE: Social History  Substance Use Topics  . Smoking status: Never Smoker  . Smokeless tobacco: Never Used  . Alcohol use 0.0 oz/week  Comment: occasionally wine     Colonoscopy:  PAP:  Bone density:  Lipid panel:  Allergies  Allergen Reactions  . Ace Inhibitors Cough  . Latex Itching  . Morphine And Related Itching    Caused her to itch terribly. Would prefer if given to take with a benadryl  . Other Itching    Freeze spray. Patient stated that she may be able to use it now because she doesn't use it as often.  Marland Kitchen Penicillins Rash    Has  patient had a PCN reaction causing immediate rash, facial/tongue/throat swelling, SOB or lightheadedness with hypotension: No Has patient had a PCN reaction causing severe rash involving mucus membranes or skin necrosis: No Has patient had a PCN reaction that required hospitalization No Has patient had a PCN reaction occurring within the last 10 years: No If all of the above answers are "NO", then may proceed with Cephalosporin use.    Current Outpatient Prescriptions  Medication Sig Dispense Refill  . aspirin EC 81 MG tablet Take 81 mg by mouth daily.     Marland Kitchen atorvastatin (LIPITOR) 10 MG tablet Take 10 mg by mouth daily at 6 PM.     . b complex vitamins capsule Take 1 capsule by mouth daily.    Marland Kitchen buPROPion (WELLBUTRIN XL) 150 MG 24 hr tablet Take 150 mg by mouth every morning.     . Calcium-Magnesium-Vitamin D (CALCIUM 1200+D3 PO) Take 1 Dose by mouth daily.    . fexofenadine (ALLEGRA) 180 MG tablet Take 180 mg by mouth at bedtime.     . gabapentin (NEURONTIN) 300 MG capsule Take 300 mg by mouth. 2 pills in am,2 pills at noon and 3 pills at night    . HYDROcodone-acetaminophen (NORCO) 5-325 MG tablet Take 1-2 tablets by mouth every 4 (four) hours as needed for moderate pain. 30 tablet 0  . letrozole (FEMARA) 2.5 MG tablet Take 1 tablet (2.5 mg total) by mouth daily. 90 tablet 3  . losartan-hydrochlorothiazide (HYZAAR) 100-25 MG per tablet Take 1 tablet by mouth every morning.     . metFORMIN (GLUCOPHAGE) 850 MG tablet Take 850 mg by mouth 2 (two) times daily with a meal.     . ondansetron (ZOFRAN ODT) 4 MG disintegrating tablet Take 1 tablet (4 mg total) by mouth every 8 (eight) hours as needed for nausea or vomiting. 20 tablet 0  . palbociclib (IBRANCE) 125 MG capsule Take 1 capsule (125 mg total) by mouth daily with breakfast. For 21 days, with 7 days off. Take whole with food. 21 capsule 0  . PARoxetine (PAXIL) 40 MG tablet Take 40 mg by mouth every morning.     . Zolpidem Tartrate (AMBIEN  PO) Take by mouth.    . lidocaine-prilocaine (EMLA) cream Apply 1 application topically as needed. Apply to port 1-2 hours prior to chemotherapy appointment. Cover with plastic wrap. (Patient not taking: Reported on 09/27/2016) 30 g 0  . oxyCODONE-acetaminophen (ROXICET) 5-325 MG tablet Take 1 tablet by mouth every 6 (six) hours as needed. (Patient not taking: Reported on 08/28/2016) 20 tablet 0   Current Facility-Administered Medications  Medication Dose Route Frequency Provider Last Rate Last Dose  . guaiFENesin-dextromethorphan (ROBITUSSIN DM) 100-10 MG/5ML syrup 5 mL  5 mL Oral Once Lloyd Huger, MD        OBJECTIVE: Vitals:   11/02/16 1157  BP: 134/82  Pulse: 85  Resp: 18  Temp: (!) 96.1 F (35.6 C)     Body mass index  is 36.03 kg/m.    ECOG FS:0 - Asymptomatic  General: Well-developed, well-nourished, no acute distress. Eyes: Pink conjunctiva, anicteric sclera. HEENT: No palpable lymphadenopathy. Breast: Left chest wall without evidence of recurrence.  Right breast and axilla without lumps or masses. Exam deferred today. Lungs: Clear to auscultation bilaterally. Heart: Regular rate and rhythm. No rubs, murmurs, or gallops. Abdomen: Soft, nontender, nondistended. No organomegaly noted, normoactive bowel sounds. Musculoskeletal: No edema, cyanosis, or clubbing. Neuro: Alert, answering all questions appropriately. Cranial nerves grossly intact. Skin: No rashes or petechiae noted. Psych: Normal affect.   LAB RESULTS:  Lab Results  Component Value Date   NA 138 11/02/2016   K 4.2 11/02/2016   CL 102 11/02/2016   CO2 24 11/02/2016   GLUCOSE 101 (H) 11/02/2016   BUN 19 11/02/2016   CREATININE 1.14 (H) 11/02/2016   CALCIUM 9.6 11/02/2016   PROT 7.1 11/02/2016   ALBUMIN 4.4 11/02/2016   AST 42 (H) 11/02/2016   ALT 30 11/02/2016   ALKPHOS 60 11/02/2016   BILITOT 0.7 11/02/2016   GFRNONAA 49 (L) 11/02/2016   GFRAA 57 (L) 11/02/2016    Lab Results  Component  Value Date   WBC 1.1 (LL) 11/02/2016   NEUTROABS 0.5 (L) 11/02/2016   HGB 11.9 (L) 11/02/2016   HCT 35.0 11/02/2016   MCV 95.9 11/02/2016   PLT 140 (L) 11/02/2016     STUDIES: No results found.  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 Now with biopsy proven stage IV disease with lymph node and bone metastasis.  PLAN:    1.  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 After lengthy discussion with the patient she is agreed to proceed with treatment using Ibrance 125 mg for 21 days with 7 days off. She also will take daily letrozole as well as monthly Zometa. Proceed with Zometa today. Return to clinic in one week for laboratory work to ensure her neutropenia improves and then in 5 weeks for her next infusion of Zometa. This appointment will coincide with the initiation of her Ibrance cycle. 2.  Osteopenia: Patient's bone mineral density on January 26, 2016 reported a T score of -0.9 which is considered normal. Continue calcium and vitamin D. Patient is now taking Zometa as above. 3. 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 Repeat laboratory work in 1 week. If no resolution, can consider dose reducing to 100 mg. 4. Peripheral neuropathy: Continue gabapentin as prescribed. Neuropathy managed by neurology.  5. Back pain: Resolved.  6. Cough: Patient does not complain of this today. Continue Tussionex as needed.  Patient expressed understanding and was in agreement with this plan. She also understands that She can call clinic at any time with any questions, concerns, or complaints.   Breast cancer   Staging form: Breast, AJCC 7th Edition     Pathologic stage from 08/11/2014: Stage IIIA (T0, N2a, cM0) -  Signed by Lloyd Huger, MD on 08/11/2014   Lloyd Huger, MD   11/03/2016 9:40 AM

## 2016-11-02 ENCOUNTER — Inpatient Hospital Stay (HOSPITAL_BASED_OUTPATIENT_CLINIC_OR_DEPARTMENT_OTHER): Payer: Medicare Other | Admitting: Oncology

## 2016-11-02 ENCOUNTER — Inpatient Hospital Stay: Payer: Medicare Other | Attending: Oncology

## 2016-11-02 ENCOUNTER — Inpatient Hospital Stay: Payer: Medicare Other

## 2016-11-02 VITALS — BP 134/82 | HR 85 | Temp 96.1°F | Resp 18 | Wt 209.9 lb

## 2016-11-02 DIAGNOSIS — M2550 Pain in unspecified joint: Secondary | ICD-10-CM

## 2016-11-02 DIAGNOSIS — M1990 Unspecified osteoarthritis, unspecified site: Secondary | ICD-10-CM | POA: Insufficient documentation

## 2016-11-02 DIAGNOSIS — F329 Major depressive disorder, single episode, unspecified: Secondary | ICD-10-CM | POA: Insufficient documentation

## 2016-11-02 DIAGNOSIS — E119 Type 2 diabetes mellitus without complications: Secondary | ICD-10-CM | POA: Diagnosis not present

## 2016-11-02 DIAGNOSIS — K219 Gastro-esophageal reflux disease without esophagitis: Secondary | ICD-10-CM | POA: Insufficient documentation

## 2016-11-02 DIAGNOSIS — Z7982 Long term (current) use of aspirin: Secondary | ICD-10-CM | POA: Diagnosis not present

## 2016-11-02 DIAGNOSIS — R05 Cough: Secondary | ICD-10-CM

## 2016-11-02 DIAGNOSIS — Z7984 Long term (current) use of oral hypoglycemic drugs: Secondary | ICD-10-CM | POA: Insufficient documentation

## 2016-11-02 DIAGNOSIS — Z170 Estrogen receptor positive status [ER+]: Secondary | ICD-10-CM | POA: Insufficient documentation

## 2016-11-02 DIAGNOSIS — D61818 Other pancytopenia: Secondary | ICD-10-CM

## 2016-11-02 DIAGNOSIS — M8580 Other specified disorders of bone density and structure, unspecified site: Secondary | ICD-10-CM | POA: Diagnosis not present

## 2016-11-02 DIAGNOSIS — C50912 Malignant neoplasm of unspecified site of left female breast: Secondary | ICD-10-CM

## 2016-11-02 DIAGNOSIS — C779 Secondary and unspecified malignant neoplasm of lymph node, unspecified: Secondary | ICD-10-CM | POA: Insufficient documentation

## 2016-11-02 DIAGNOSIS — G4700 Insomnia, unspecified: Secondary | ICD-10-CM | POA: Insufficient documentation

## 2016-11-02 DIAGNOSIS — C7951 Secondary malignant neoplasm of bone: Secondary | ICD-10-CM

## 2016-11-02 DIAGNOSIS — I10 Essential (primary) hypertension: Secondary | ICD-10-CM

## 2016-11-02 DIAGNOSIS — C50919 Malignant neoplasm of unspecified site of unspecified female breast: Secondary | ICD-10-CM

## 2016-11-02 DIAGNOSIS — E785 Hyperlipidemia, unspecified: Secondary | ICD-10-CM | POA: Insufficient documentation

## 2016-11-02 DIAGNOSIS — Z79899 Other long term (current) drug therapy: Secondary | ICD-10-CM | POA: Diagnosis not present

## 2016-11-02 DIAGNOSIS — G629 Polyneuropathy, unspecified: Secondary | ICD-10-CM | POA: Diagnosis not present

## 2016-11-02 LAB — COMPREHENSIVE METABOLIC PANEL
ALK PHOS: 60 U/L (ref 38–126)
ALT: 30 U/L (ref 14–54)
AST: 42 U/L — AB (ref 15–41)
Albumin: 4.4 g/dL (ref 3.5–5.0)
Anion gap: 12 (ref 5–15)
BILIRUBIN TOTAL: 0.7 mg/dL (ref 0.3–1.2)
BUN: 19 mg/dL (ref 6–20)
CALCIUM: 9.6 mg/dL (ref 8.9–10.3)
CO2: 24 mmol/L (ref 22–32)
Chloride: 102 mmol/L (ref 101–111)
Creatinine, Ser: 1.14 mg/dL — ABNORMAL HIGH (ref 0.44–1.00)
GFR calc Af Amer: 57 mL/min — ABNORMAL LOW (ref >60)
GFR calc non Af Amer: 49 mL/min — ABNORMAL LOW (ref >60)
GLUCOSE: 101 mg/dL — AB (ref 65–99)
POTASSIUM: 4.2 mmol/L (ref 3.5–5.1)
SODIUM: 138 mmol/L (ref 135–145)
TOTAL PROTEIN: 7.1 g/dL (ref 6.5–8.1)

## 2016-11-02 LAB — CBC WITH DIFFERENTIAL/PLATELET
Basophils Absolute: 0 10*3/uL (ref 0–0.1)
Basophils Relative: 4 %
EOS ABS: 0.1 10*3/uL (ref 0–0.7)
EOS PCT: 5 %
HCT: 35 % (ref 35.0–47.0)
HEMOGLOBIN: 11.9 g/dL — AB (ref 12.0–16.0)
LYMPHS ABS: 0.4 10*3/uL — AB (ref 1.0–3.6)
Lymphocytes Relative: 36 %
MCH: 32.5 pg (ref 26.0–34.0)
MCHC: 33.9 g/dL (ref 32.0–36.0)
MCV: 95.9 fL (ref 80.0–100.0)
MONOS PCT: 11 %
Monocytes Absolute: 0.1 10*3/uL — ABNORMAL LOW (ref 0.2–0.9)
Neutro Abs: 0.5 10*3/uL — ABNORMAL LOW (ref 1.4–6.5)
Neutrophils Relative %: 44 %
PLATELETS: 140 10*3/uL — AB (ref 150–440)
RBC: 3.65 MIL/uL — ABNORMAL LOW (ref 3.80–5.20)
RDW: 14 % (ref 11.5–14.5)
WBC: 1.1 10*3/uL — CL (ref 3.6–11.0)

## 2016-11-02 MED ORDER — HEPARIN SOD (PORK) LOCK FLUSH 100 UNIT/ML IV SOLN
500.0000 [IU] | Freq: Once | INTRAVENOUS | Status: AC
  Administered 2016-11-02: 500 [IU] via INTRAVENOUS

## 2016-11-02 MED ORDER — ZOLEDRONIC ACID 4 MG/100ML IV SOLN
4.0000 mg | Freq: Once | INTRAVENOUS | Status: AC
  Administered 2016-11-02: 4 mg via INTRAVENOUS
  Filled 2016-11-02: qty 100

## 2016-11-02 MED ORDER — SODIUM CHLORIDE 0.9 % IV SOLN
Freq: Once | INTRAVENOUS | Status: AC
  Administered 2016-11-02: 12:00:00 via INTRAVENOUS
  Filled 2016-11-02: qty 1000

## 2016-11-02 NOTE — Progress Notes (Signed)
Patient reports she is tolerating Ibrance well.

## 2016-11-06 ENCOUNTER — Other Ambulatory Visit: Payer: Self-pay | Admitting: Oncology

## 2016-11-06 DIAGNOSIS — C50919 Malignant neoplasm of unspecified site of unspecified female breast: Secondary | ICD-10-CM

## 2016-11-09 ENCOUNTER — Other Ambulatory Visit: Payer: Self-pay | Admitting: Oncology

## 2016-11-09 ENCOUNTER — Other Ambulatory Visit: Payer: Self-pay | Admitting: Pharmacist

## 2016-11-09 ENCOUNTER — Inpatient Hospital Stay: Payer: Medicare Other

## 2016-11-09 DIAGNOSIS — C50912 Malignant neoplasm of unspecified site of left female breast: Secondary | ICD-10-CM

## 2016-11-09 DIAGNOSIS — Z170 Estrogen receptor positive status [ER+]: Secondary | ICD-10-CM | POA: Diagnosis not present

## 2016-11-09 DIAGNOSIS — C50919 Malignant neoplasm of unspecified site of unspecified female breast: Secondary | ICD-10-CM

## 2016-11-09 DIAGNOSIS — C7951 Secondary malignant neoplasm of bone: Secondary | ICD-10-CM | POA: Diagnosis not present

## 2016-11-09 DIAGNOSIS — Z79899 Other long term (current) drug therapy: Secondary | ICD-10-CM | POA: Diagnosis not present

## 2016-11-09 DIAGNOSIS — Z7984 Long term (current) use of oral hypoglycemic drugs: Secondary | ICD-10-CM | POA: Diagnosis not present

## 2016-11-09 DIAGNOSIS — C779 Secondary and unspecified malignant neoplasm of lymph node, unspecified: Secondary | ICD-10-CM | POA: Diagnosis not present

## 2016-11-09 LAB — COMPREHENSIVE METABOLIC PANEL
ALBUMIN: 4.3 g/dL (ref 3.5–5.0)
ALK PHOS: 59 U/L (ref 38–126)
ALT: 34 U/L (ref 14–54)
AST: 48 U/L — AB (ref 15–41)
Anion gap: 11 (ref 5–15)
BUN: 19 mg/dL (ref 6–20)
CALCIUM: 9.8 mg/dL (ref 8.9–10.3)
CHLORIDE: 98 mmol/L — AB (ref 101–111)
CO2: 27 mmol/L (ref 22–32)
CREATININE: 1.2 mg/dL — AB (ref 0.44–1.00)
GFR calc non Af Amer: 46 mL/min — ABNORMAL LOW (ref >60)
GFR, EST AFRICAN AMERICAN: 54 mL/min — AB (ref >60)
GLUCOSE: 101 mg/dL — AB (ref 65–99)
Potassium: 3.9 mmol/L (ref 3.5–5.1)
SODIUM: 136 mmol/L (ref 135–145)
Total Bilirubin: 0.4 mg/dL (ref 0.3–1.2)
Total Protein: 7.4 g/dL (ref 6.5–8.1)

## 2016-11-09 LAB — CBC WITH DIFFERENTIAL/PLATELET
BASOS ABS: 0 10*3/uL (ref 0–0.1)
BASOS PCT: 2 %
EOS ABS: 0 10*3/uL (ref 0–0.7)
EOS PCT: 2 %
HCT: 34.5 % — ABNORMAL LOW (ref 35.0–47.0)
HEMOGLOBIN: 11.6 g/dL — AB (ref 12.0–16.0)
LYMPHS ABS: 0.5 10*3/uL — AB (ref 1.0–3.6)
Lymphocytes Relative: 35 %
MCH: 32.6 pg (ref 26.0–34.0)
MCHC: 33.8 g/dL (ref 32.0–36.0)
MCV: 96.4 fL (ref 80.0–100.0)
Monocytes Absolute: 0.3 10*3/uL (ref 0.2–0.9)
Monocytes Relative: 25 %
NEUTROS PCT: 36 %
Neutro Abs: 0.5 10*3/uL — ABNORMAL LOW (ref 1.4–6.5)
PLATELETS: 218 10*3/uL (ref 150–440)
RBC: 3.57 MIL/uL — AB (ref 3.80–5.20)
RDW: 14.1 % (ref 11.5–14.5)
WBC: 1.3 10*3/uL — AB (ref 3.6–11.0)

## 2016-11-09 MED ORDER — PALBOCICLIB 100 MG PO CAPS: 100.0000 mg | ORAL_CAPSULE | Freq: Every day | ORAL | 5 refills | Status: DC

## 2016-11-09 NOTE — Progress Notes (Signed)
Oral Chemotherapy Pharmacist Encounter  Patient dose was decreased due to neutropenia. New prescription was e-scribed to [facility] the pharmacy that fills the [place] for [employer] Patient Assistance.  I let Ms. Taflinger know that a new prescription was sent in for her and gave her the number the pharmacy [phone]).  Dr. Grayland Ormond would like for her to get started on her next cycle as soon as she receives the medication.   Darl Pikes, PharmD, BCPS Hematology/Oncology Clinical Pharmacist [facility] [phone]  11/09/2016 3:27 PM

## 2016-11-10 ENCOUNTER — Telehealth: Payer: Self-pay | Admitting: Oncology

## 2016-11-10 ENCOUNTER — Telehealth: Payer: Self-pay | Admitting: *Deleted

## 2016-11-10 NOTE — Telephone Encounter (Signed)
Patient called to ask if there was any food that she could eat to increase her WBC, I explained that there is none, her bone marrow just needs time to recover. She then stated she got a call from [employer] that her [name] will arrive tomorrow and she states she will begin taking it tomorrow.   Dx:  Malignant neoplasm of left breast in ...   Ref Range & Units 1d ago  WBC 3.6 - 11.0 K/uL 1.3    RBC 3.80 - 5.20 MIL/uL 3.57    Comment: RESULT REPEATED AND VERIFIED  CANCER CENTER CRITICAL VALUE PROTOCOL   Hemoglobin 12.0 - 16.0 g/dL 11.6    HCT 35.0 - 47.0 % 34.5    MCV 80.0 - 100.0 fL 96.4   MCH 26.0 - 34.0 pg 32.6   MCHC 32.0 - 36.0 g/dL 33.8   RDW 11.5 - 14.5 % 14.1   Platelets 150 - 440 K/uL 218   Neutrophils Relative % % 36   Neutro Abs 1.4 - 6.5 K/uL 0.5    Lymphocytes Relative % 35   Lymphs Abs 1.0 - 3.6 K/uL 0.5    Monocytes Relative % 25   Monocytes Absolute 0.2 - 0.9 K/uL 0.3   Eosinophils Relative % 2   Eosinophils Absolute 0 - 0.7 K/uL 0.0   Basophils Relative % 2   Basophils Absolute 0 - 0.1 K/uL 0.0

## 2016-11-10 NOTE — Telephone Encounter (Addendum)
Oral Oncology Patient Advocate Encounter  Spoke with Amanda [name] she will be getting her medication delivered to her Saturday.   Ibrance 100mg   Told her to call if she needs anything else.  I also called [employer] to verify that it was the correct strength and to be shipped saturday 11/11/2016.   [place] Patient Advocate [phone] 11/10/2016 9:52 AM

## 2016-11-10 NOTE — Telephone Encounter (Signed)
Agreed. [name] has been dose reduced.

## 2016-11-13 DIAGNOSIS — T451X5A Adverse effect of antineoplastic and immunosuppressive drugs, initial encounter: Secondary | ICD-10-CM | POA: Diagnosis not present

## 2016-11-13 DIAGNOSIS — R200 Anesthesia of skin: Secondary | ICD-10-CM | POA: Insufficient documentation

## 2016-11-13 DIAGNOSIS — G620 Drug-induced polyneuropathy: Secondary | ICD-10-CM | POA: Diagnosis not present

## 2016-11-13 DIAGNOSIS — R208 Other disturbances of skin sensation: Secondary | ICD-10-CM | POA: Diagnosis not present

## 2016-11-13 DIAGNOSIS — R202 Paresthesia of skin: Secondary | ICD-10-CM

## 2016-11-13 NOTE — Telephone Encounter (Signed)
Oral Oncology Patient Advocate Encounter  Patient called to let me know she had gotten her mediation and took the first one today.  Told her to call us if she has any questions.   [place] Patient Advocate [phone] 11/13/2016 11:01 AM

## 2016-11-16 DIAGNOSIS — Z923 Personal history of irradiation: Secondary | ICD-10-CM

## 2016-11-16 HISTORY — DX: Personal history of irradiation: Z92.3

## 2016-11-20 ENCOUNTER — Telehealth: Payer: Self-pay | Admitting: *Deleted

## 2016-11-20 NOTE — Telephone Encounter (Signed)
I typically like to see patients as they finished their 21 days of Ibrance as it gives me a better indication of their nadir.

## 2016-11-20 NOTE — Telephone Encounter (Signed)
So you want to leave appts as they are?

## 2016-11-20 NOTE — Telephone Encounter (Signed)
Patient informed that doctor wants to leave appts as they are. She stated "Alright"

## 2016-11-20 NOTE — Telephone Encounter (Signed)
Yes please

## 2016-11-20 NOTE — Telephone Encounter (Signed)
Patient called with questions regarding her appointment and Ibrance. States she has an appointment 11/21 for lab/md/inf, but she did not start her Ibrance until 10/29 and the 21 st day would be 11/18. Also reading the info for [name] it is recommended that labs be checked every 15 days so she is asking if she should come in the 12 th for lab check and see if her WBC is low and possibly dose reduce if needed.  Please advise

## 2016-11-27 ENCOUNTER — Ambulatory Visit: Payer: Medicare Other | Attending: Oncology | Admitting: Occupational Therapy

## 2016-11-27 ENCOUNTER — Inpatient Hospital Stay: Payer: Medicare Other | Attending: Oncology | Admitting: Oncology

## 2016-11-27 VITALS — BP 120/74 | HR 78 | Temp 98.6°F | Resp 18 | Wt 211.6 lb

## 2016-11-27 DIAGNOSIS — Z79899 Other long term (current) drug therapy: Secondary | ICD-10-CM | POA: Insufficient documentation

## 2016-11-27 DIAGNOSIS — E119 Type 2 diabetes mellitus without complications: Secondary | ICD-10-CM | POA: Insufficient documentation

## 2016-11-27 DIAGNOSIS — R05 Cough: Secondary | ICD-10-CM | POA: Insufficient documentation

## 2016-11-27 DIAGNOSIS — G629 Polyneuropathy, unspecified: Secondary | ICD-10-CM | POA: Diagnosis not present

## 2016-11-27 DIAGNOSIS — D1724 Benign lipomatous neoplasm of skin and subcutaneous tissue of left leg: Secondary | ICD-10-CM | POA: Insufficient documentation

## 2016-11-27 DIAGNOSIS — Z170 Estrogen receptor positive status [ER+]: Secondary | ICD-10-CM | POA: Insufficient documentation

## 2016-11-27 DIAGNOSIS — M79652 Pain in left thigh: Secondary | ICD-10-CM

## 2016-11-27 DIAGNOSIS — C779 Secondary and unspecified malignant neoplasm of lymph node, unspecified: Secondary | ICD-10-CM | POA: Insufficient documentation

## 2016-11-27 DIAGNOSIS — C50912 Malignant neoplasm of unspecified site of left female breast: Secondary | ICD-10-CM | POA: Insufficient documentation

## 2016-11-27 DIAGNOSIS — Z79811 Long term (current) use of aromatase inhibitors: Secondary | ICD-10-CM | POA: Diagnosis not present

## 2016-11-27 DIAGNOSIS — M2550 Pain in unspecified joint: Secondary | ICD-10-CM | POA: Diagnosis not present

## 2016-11-27 DIAGNOSIS — I10 Essential (primary) hypertension: Secondary | ICD-10-CM | POA: Insufficient documentation

## 2016-11-27 DIAGNOSIS — M25512 Pain in left shoulder: Secondary | ICD-10-CM | POA: Diagnosis not present

## 2016-11-27 DIAGNOSIS — K219 Gastro-esophageal reflux disease without esophagitis: Secondary | ICD-10-CM | POA: Diagnosis not present

## 2016-11-27 DIAGNOSIS — D61818 Other pancytopenia: Secondary | ICD-10-CM | POA: Diagnosis not present

## 2016-11-27 DIAGNOSIS — Z803 Family history of malignant neoplasm of breast: Secondary | ICD-10-CM | POA: Insufficient documentation

## 2016-11-27 DIAGNOSIS — M8580 Other specified disorders of bone density and structure, unspecified site: Secondary | ICD-10-CM | POA: Insufficient documentation

## 2016-11-27 DIAGNOSIS — L599 Disorder of the skin and subcutaneous tissue related to radiation, unspecified: Secondary | ICD-10-CM | POA: Insufficient documentation

## 2016-11-27 DIAGNOSIS — G4700 Insomnia, unspecified: Secondary | ICD-10-CM | POA: Diagnosis not present

## 2016-11-27 DIAGNOSIS — M25612 Stiffness of left shoulder, not elsewhere classified: Secondary | ICD-10-CM | POA: Diagnosis not present

## 2016-11-27 DIAGNOSIS — R29898 Other symptoms and signs involving the musculoskeletal system: Secondary | ICD-10-CM | POA: Insufficient documentation

## 2016-11-27 DIAGNOSIS — Z9012 Acquired absence of left breast and nipple: Secondary | ICD-10-CM

## 2016-11-27 DIAGNOSIS — E785 Hyperlipidemia, unspecified: Secondary | ICD-10-CM | POA: Diagnosis not present

## 2016-11-27 DIAGNOSIS — C7951 Secondary malignant neoplasm of bone: Secondary | ICD-10-CM | POA: Diagnosis not present

## 2016-11-27 DIAGNOSIS — D709 Neutropenia, unspecified: Secondary | ICD-10-CM | POA: Diagnosis not present

## 2016-11-27 DIAGNOSIS — F329 Major depressive disorder, single episode, unspecified: Secondary | ICD-10-CM | POA: Diagnosis not present

## 2016-11-27 DIAGNOSIS — Z23 Encounter for immunization: Secondary | ICD-10-CM | POA: Insufficient documentation

## 2016-11-27 DIAGNOSIS — I972 Postmastectomy lymphedema syndrome: Secondary | ICD-10-CM | POA: Insufficient documentation

## 2016-11-27 MED ORDER — IBUPROFEN 800 MG PO TABS: 800.0000 mg | ORAL_TABLET | Freq: Three times a day (TID) | ORAL | 0 refills | Status: DC | PRN

## 2016-11-27 NOTE — Progress Notes (Signed)
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 After lengthy discussion with the patient she is agreed to proceed with treatment using Ibrance 125 mg for 21 days with 7 days off. She also will take daily letrozole as well as monthly Zometa  Interval history- Patient was last seen in medical oncology on 11/02/2016 by Dr. Grayland Ormond for evaluation, Zometa injection and to assess her tolerance of Ibrance. 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 She continued letrozole and received her monthly Zometa. She was scheduled to return in one week for laboratory work to check her neutropenia.  Today, she presents for pain and "lump" on the back of the left thigh. Approximately 3 weeks ago she noticed the "lump" after falling while feeding a cat outside. She states she was sitting on a stool that broke and she fell on her bottom. She denies any obvious injury to her bottom or legs. She denies busing after the incdient. She took a shower and noticed on her left thigh a painless lump. She has been watching it for the past 3 weeks. It is not changed in size. It  has never been painful. The pain in the left leg began around the same time she noticed the lump.  It is described as a sharp constant 6 out of 10 pain. It does not radiate. She states she thinks it could be muscular. She has tried tylenol with minimal relief. She denies fevers, chest pain, shortness of breath or abdominal pain. She offers no further complaints.  ECOG FS:0 - Asymptomatic  Review of systems- Review of Systems  Constitutional: Negative for chills, fever, malaise/fatigue and weight loss.  HENT: Negative.   Eyes: Negative.   Respiratory: Negative.   Cardiovascular: Negative.   Gastrointestinal: Negative.   Genitourinary: Negative.   Musculoskeletal: Positive for falls and myalgias.  Skin: Negative.   Neurological: Negative for weakness.  Endo/Heme/Allergies: Negative.   Psychiatric/Behavioral: Negative.      Current treatment- ibrance, Zometa and letrozole  Allergies  Allergen Reactions  . Ace Inhibitors Cough  . Latex Itching  . Morphine And Related Itching    Caused her to itch terribly. Would prefer if given to take with a benadryl  . Other Itching    Freeze spray. Patient stated that she may be able to use it now because she doesn't use it as often.  Marland Kitchen Penicillins Rash    Has patient had a PCN reaction causing immediate rash, facial/tongue/throat swelling, SOB or lightheadedness with hypotension: No Has patient had a PCN reaction causing severe rash involving mucus membranes or skin necrosis: No Has patient had a PCN reaction that required hospitalization No Has patient had a PCN reaction occurring within the last 10 years: No If all of  the above answers are "NO", then may proceed with Cephalosporin use.     Past Medical History:  Diagnosis Date  . Back pain    occasionally  . Breast cancer ([place])    left  . Depression    takes Paxil and Wellbutrin daily  . Diabetes ([place])    takes Metformin daily  . GERD (gastroesophageal reflux disease)   . History of  bronchitis 2 yrs ago  . Hyperlipidemia    takes Atorvastatin daily  . Hypertension    takes Losartan-HCTZ daily  . Insomnia    takes Ambien nightly  . Insomnia    takes gabapentin nightly  . Joint pain   . Migraine   . Mood swings ([place])   . Osteoarthritis of knee   . PONV (postoperative nausea and vomiting)   . Seasonal allergies    takes Allegra daily     Past Surgical History:  Procedure Laterality Date  . BREAST BIOPSY  2009  . cataract surgery Bilateral   . COLONOSCOPY    . JOINT REPLACEMENT Left 2014   knee  . KNEE ARTHROSCOPY Left    x 5  . MASTECTOMY Left   . port a cath placed    . TOTAL SHOULDER REPLACEMENT Right 06/03/2015  . TUBAL LIGATION      Social History   Socioeconomic History  . Marital status: Married    Spouse name: Not on file  . Number of children: Not on file  . Years of education: Not on file  . Highest education level: Not on file  Social Needs  . Financial resource strain: Not on file  . Food insecurity - worry: Not on file  . Food insecurity - inability: Not on file  . Transportation needs - medical: Not on file  . Transportation needs - non-medical: Not on file  Occupational History  . Not on file  Tobacco Use  . Smoking status: Never Smoker  . Smokeless tobacco: Never Used  Substance and Sexual Activity  . Alcohol use: Yes    Alcohol/week: 0.0 oz    Comment: occasionally wine  . Drug use: Yes    Types: Marijuana    Comment: cannabis with no extra  . Sexual activity: Yes    Birth control/protection: Post-menopausal  Other Topics Concern  . Not on file  Social History Narrative  . Not on file    Family History  Problem Relation Age of Onset  . Atrial fibrillation Mother   . Non-Hodgkin's lymphoma Father   . Breast cancer Maternal Aunt        x2  . Heart attack Maternal Uncle   . Melanoma Maternal Uncle   . Breast cancer Paternal Aunt        x2     Current Outpatient Medications:  .  aspirin EC 81 MG tablet,  Take 81 mg by mouth daily. , Disp: , Rfl:  .  atorvastatin (LIPITOR) 10 MG tablet, Take 10 mg by mouth daily at 6 PM. , Disp: , Rfl:  .  b complex vitamins capsule, Take 1 capsule by mouth daily., Disp: , Rfl:  .  buPROPion (WELLBUTRIN XL) 150 MG 24 hr tablet, Take 150 mg by mouth every morning. , Disp: , Rfl:  .  Calcium-Magnesium-Vitamin D (CALCIUM 1200+D3 PO), Take 1 Dose by mouth daily., Disp: , Rfl:  .  fexofenadine (ALLEGRA) 180 MG tablet, Take 180 mg by mouth at bedtime. , Disp: , Rfl:  .  gabapentin (NEURONTIN) 300 MG  capsule, Take 300 mg by mouth. 2 pills in am,2 pills at noon and 3 pills at night, Disp: , Rfl:  .  HYDROcodone-acetaminophen (NORCO) 5-325 MG tablet, Take 1-2 tablets by mouth every 4 (four) hours as needed for moderate pain., Disp: 30 tablet, Rfl: 0 .  letrozole (FEMARA) 2.5 MG tablet, Take 1 tablet (2.5 mg total) by mouth daily., Disp: 90 tablet, Rfl: 3 .  lidocaine-prilocaine (EMLA) cream, Apply 1 application topically as needed. Apply to port 1-2 hours prior to chemotherapy appointment. Cover with plastic wrap., Disp: 30 g, Rfl: 0 .  losartan-hydrochlorothiazide (HYZAAR) 100-25 MG per tablet, Take 1 tablet by mouth every morning. , Disp: , Rfl:  .  metFORMIN (GLUCOPHAGE) 850 MG tablet, Take 850 mg by mouth 2 (two) times daily with a meal. , Disp: , Rfl:  .  ondansetron (ZOFRAN ODT) 4 MG disintegrating tablet, Take 1 tablet (4 mg total) by mouth every 8 (eight) hours as needed for nausea or vomiting., Disp: 20 tablet, Rfl: 0 .  oxyCODONE-acetaminophen (ROXICET) 5-325 MG tablet, Take 1 tablet by mouth every 6 (six) hours as needed., Disp: 20 tablet, Rfl: 0 .  palbociclib (IBRANCE) 100 MG capsule, Take 1 capsule (100 mg total) by mouth daily with breakfast. Take for 21 days on, then 7 days off. Take whole with food., Disp: 21 capsule, Rfl: 5 .  PARoxetine (PAXIL) 40 MG tablet, Take 40 mg by mouth every morning. , Disp: , Rfl:  .  Zolpidem Tartrate (AMBIEN PO), Take by mouth.,  Disp: , Rfl:  .  ibuprofen (ADVIL,MOTRIN) 800 MG tablet, Take 1 tablet (800 mg total) every 8 (eight) hours as needed by mouth for mild pain., Disp: 30 tablet, Rfl: 0  Current Facility-Administered Medications:  .  guaiFENesin-dextromethorphan (ROBITUSSIN DM) 100-10 MG/5ML syrup 5 mL, 5 mL, Oral, Once, Grayland Ormond, Kathlene November, MD  Physical exam:  Vitals:   11/27/16 1123  BP: 120/74  Pulse: 78  Resp: 18  Temp: 98.6 F (37 C)  TempSrc: Tympanic  Weight: 211 lb 9.6 oz (96 kg)   Physical Exam  Constitutional: She is oriented to person, place, and time and well-developed, well-nourished, and in no distress.  HENT:  Head: Normocephalic and atraumatic.  Eyes: Pupils are equal, round, and reactive to light.  Neck: Normal range of motion. Neck supple.  Cardiovascular: Normal rate and regular rhythm.  Pulmonary/Chest: Effort normal and breath sounds normal.  Abdominal: Soft. Normal appearance and bowel sounds are normal.  Musculoskeletal: Normal range of motion. She exhibits tenderness.       Left upper leg: She exhibits tenderness.       Legs: Neurological: She is alert and oriented to person, place, and time. Gait normal.  Skin: Skin is warm, dry and intact.     CMP Latest Ref Rng & Units 11/09/2016  Glucose 65 - 99 mg/dL 101(H)  BUN 6 - 20 mg/dL 19  Creatinine 0.44 - 1.00 mg/dL 1.20(H)  Sodium 135 - 145 mmol/L 136  Potassium 3.5 - 5.1 mmol/L 3.9  Chloride 101 - 111 mmol/L 98(L)  CO2 22 - 32 mmol/L 27  Calcium 8.9 - 10.3 mg/dL 9.8  Total Protein 6.5 - 8.1 g/dL 7.4  Total Bilirubin 0.3 - 1.2 mg/dL 0.4  Alkaline Phos 38 - 126 U/L 59  AST 15 - 41 U/L 48(H)  ALT 14 - 54 U/L 34   CBC Latest Ref Rng & Units 11/09/2016  WBC 3.6 - 11.0 K/uL 1.3(LL)  Hemoglobin 12.0 - 16.0 g/dL  11.6(L)  Hematocrit 35.0 - 47.0 % 34.5(L)  Platelets 150 - 440 K/uL 218    No images are attached to the encounter.  No results found.   Assessment and plan- Patient is a 66 y.o. female who presents with  a small 2.5 X 1 in cyst/lipoma on back of left thigh. On palpation it is non-painful, moveable lipoma. She complains of pain above the lipoma and rates it a constant 7/10. There is no obvious deformity to the left thigh. No skin discoloration or bruising.  1. Lipoma on back of left thigh: Continue to monitor this. During next visit we will reevaluate to make sure there is no changes in size. Since it is not painful, no nterventions at this time. 2. Left leg pain: Not sure of the etiology. This could be muscular. Recommended antiinflammatory and for her to apply warm compresses. RX Ibuprofen 800 mg TID. Labs are ok.  3. RTC as scheduled to see Dr. Grayland Ormond for labs and Zometa injection.    Visit Diagnosis 1. Pain in left thigh   2. Lipoma of left thigh     Patient expressed understanding and was in agreement with this plan. She also understands that She can call clinic at any time with any questions, concerns, or complaints.   Greater than 50% was spent in counseling and coordination of care with this patient including but not limited to discussion of the relevant topics above (See A&P) including, but not limited to diagnosis and management of acute and chronic medical conditions.    Faythe Casa, AGNP-C [facility]- [phone] Pager- [phone] 11/27/2016 4:14 PM

## 2016-11-27 NOTE — Therapy (Signed)
[facility] [address] [place], [address] Phone: [phone]   Fax:  [phone]  Occupational Therapy Treatment  Patient Details  Name: Amanda [name] MRN: [id number] Date of Birth: September 17, 1950 Referring Provider: Grayland Ormond   Encounter Date: 11/27/2016  OT End of Session - 11/27/16 1052    Visit Number  7    Number of Visits  24    Date for OT Re-Evaluation  01/22/17    OT Start Time  0950    OT Stop Time  1030    OT Time Calculation (min)  40 min    Activity Tolerance  Patient tolerated treatment well    Behavior During Therapy  [facility] for tasks assessed/performed       Past Medical History:  Diagnosis Date  . Back pain    occasionally  . Breast cancer ([place])    left  . Depression    takes Paxil and Wellbutrin daily  . Diabetes ([place])    takes Metformin daily  . GERD (gastroesophageal reflux disease)   . History of bronchitis 2 yrs ago  . Hyperlipidemia    takes Atorvastatin daily  . Hypertension    takes Losartan-HCTZ daily  . Insomnia    takes Ambien nightly  . Insomnia    takes gabapentin nightly  . Joint pain   . Migraine   . Mood swings ([place])   . Osteoarthritis of knee   . PONV (postoperative nausea and vomiting)   . Seasonal allergies    takes Allegra daily    Past Surgical History:  Procedure Laterality Date  . BREAST BIOPSY  2009  . cataract surgery Bilateral   . COLONOSCOPY    . JOINT REPLACEMENT Left 2014   knee  . KNEE ARTHROSCOPY Left    x 5  . MASTECTOMY Left   . port a cath placed    . TOTAL SHOULDER REPLACEMENT Right 06/03/2015  . TUBAL LIGATION      There were no vitals filed for this visit.  Subjective Assessment - 11/27/16 0956    Subjective   I have hard time walking - have this knot back of my L leg- started about 3 wks ago - did not see MD yet - going after you to see if I can see Dr Grayland Ormond - in 7 days check my labs again - infusion - I did not wear my  jovipak after one night the velcro cause at bruise and then my compression sleeve 1 x week     Patient Stated Goals  Patient reports she wants to control the swelling in her left arm, be able to take care of herself.     Pain Score  4     Pain Location  Leg    Pain Orientation  Left    Pain Type  Acute pain    Pain Onset  1 to 4 weeks ago          LYMPHEDEMA/ONCOLOGY QUESTIONNAIRE - 11/27/16 1012      Left Upper Extremity Lymphedema   15 cm Proximal to Olecranon Process  42.5 cm    10 cm Proximal to Olecranon Process  41.7 cm    Olecranon Process  28.7 cm    15 cm Proximal to Ulnar Styloid Process  28.6 cm    10 cm Proximal to Ulnar Styloid Process  25 cm    Just Proximal to Ulnar Styloid Process  17.4 cm    Across  Hand at [employer]  20.8 cm    At [place] of 2nd Digit  7 cm    At [facility] of Thumb  7 cm      Pt report a knot back of thigh on L - week or 2 after falling off 8 inch seat that broked - encourage to see MD - see is going over to [facility] center after session today to have it check out   Pt report had issues one night with jovipak breast  pad and causing a bruise - because velcro folded - did not wear since  And also daytime compression sleeve only 1 x wk  Measurements taken - see flowsheet -  Pt to cont with home program for month this time again - but alternate during day jovipak breast pad one day and arm sleeve with gauntlet - one day  Will reassess  Jovipak during day -now that it got  Cooler to clear trunkle lymphedema                OT Education - 11/27/16 1052    Education provided  Yes    Education Details  Wearing of compression garments     Person(s) Educated  Patient    Methods  Explanation;Demonstration;Tactile cues    Comprehension  Verbalized understanding;Returned demonstration;Verbal cues required          OT Long Term Goals - 11/27/16 1216      OT LONG TERM GOAL #1   Title  Patient will be independent in wear, care and schedule of  compression garments.     Baseline  did not wear garments the last month as recommended and jovipak breast pad irritated her one night     Time  4    Period  Weeks    Status  On-going      OT LONG TERM GOAL #2   Title  Patient will demonstrate HEP with modified independence.     Baseline  --    Time  4    Status  Achieved      OT LONG TERM GOAL #3   Title  Patient will decrease circumferential measurements by 1.5 cm from mid forearm to upper arm to fit into compression garment and for maintenace of lymphedema symptoms.     Baseline  improving see flowsheet     Time  4    Period  Weeks    Status  On-going    Target Date  12/25/16            Plan - 11/27/16 1053    Clinical Impression Statement  Assess L UE - see flowsheet- pt did not wear compression garment the last 4 wks - as before - hand to forearm increase in circumference - upper arm maintain - reinforce for pt to be compliant - suggest wearing jovipak breast pad during day to prevent irritation but alternating it with compression sleeve and gauntlet - pt to return in month again for remeasurement and possible discharge     Occupational performance deficits (Please refer to evaluation for details):  ADL's;IADL's;Rest and Sleep    Rehab Potential  Good    Current Impairments/barriers affecting progress:  metastatic disease.  Husband travels for work for extended periods of time in which patient is alone.     OT Frequency  Monthly    OT Duration  8 weeks    OT Treatment/Interventions  Self-care/ADL training;DME and/or AE instruction;Manual lymph drainage;Patient/family education;Compression bandaging;Therapeutic exercises;Scar mobilization;Passive range  of motion;Manual Therapy    Plan  return in month for reassessment     Clinical Decision Making  Limited treatment options, no task modification necessary    OT Home Exercise Plan  see pt instructions     Consulted and Agree with Plan of Care  Patient       Patient will  benefit from skilled therapeutic intervention in order to improve the following deficits and impairments:  Decreased range of motion, Increased edema, Decreased scar mobility, Pain, Impaired UE functional use, Decreased strength  Visit Diagnosis: Postmastectomy lymphedema  Disorder of the skin and subcutaneous tissue related to radiation, unspecified  Other symptoms and signs involving the musculoskeletal system  Left shoulder pain, unspecified chronicity  Stiffness of left shoulder, not elsewhere classified    Problem List Patient Active Problem List   Diagnosis Date Noted  . Malignant neoplasm of left breast in female, estrogen receptor positive ([place]) 10/05/2016  . Metastatic breast cancer ([place]) 09/24/2015  . 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 Personal history of breast cancer 08/23/2015  . 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 Degenerative arthritis of right shoulder region 01/13/2015  . Cancer of left female breast  ([place]) 07/27/2014  . 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 Rotator cuff tear 06/10/2014  . Allergic rhinitis, seasonal 09/16/2013  . Depression 08/10/2013  . Diabetes mellitus type 2, uncomplicated ([place]) 46/27/0350  . Hyperlipidemia, unspecified 08/10/2013  . BP (high blood pressure) 08/10/2013  . 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, [place], [address] Phone: [phone]   Fax:  [phone]  Name: ALILA SOTERO MRN: [id number] Date of Birth: [birth year]

## 2016-11-27 NOTE — Patient Instructions (Signed)
Wear daytime compression sleeve every other day  And alternate with jovipak breast pad ( but in daytime - now that it is cooler and she can assess that the velcro do not cause irritation)

## 2016-12-05 DIAGNOSIS — Z9221 Personal history of antineoplastic chemotherapy: Secondary | ICD-10-CM

## 2016-12-05 HISTORY — DX: Personal history of antineoplastic chemotherapy: Z92.21

## 2016-12-06 ENCOUNTER — Inpatient Hospital Stay: Payer: Medicare Other

## 2016-12-06 ENCOUNTER — Inpatient Hospital Stay (HOSPITAL_BASED_OUTPATIENT_CLINIC_OR_DEPARTMENT_OTHER): Payer: Medicare Other | Admitting: Oncology

## 2016-12-06 ENCOUNTER — Other Ambulatory Visit: Payer: Self-pay

## 2016-12-06 ENCOUNTER — Inpatient Hospital Stay: Payer: Medicare Other | Admitting: *Deleted

## 2016-12-06 ENCOUNTER — Telehealth: Payer: Self-pay | Admitting: Oncology

## 2016-12-06 ENCOUNTER — Encounter: Payer: Self-pay | Admitting: Oncology

## 2016-12-06 VITALS — BP 116/78 | HR 78 | Temp 97.5°F | Wt 210.9 lb

## 2016-12-06 DIAGNOSIS — I10 Essential (primary) hypertension: Secondary | ICD-10-CM

## 2016-12-06 DIAGNOSIS — M1991 Primary osteoarthritis, unspecified site: Secondary | ICD-10-CM | POA: Insufficient documentation

## 2016-12-06 DIAGNOSIS — S8000XA Contusion of unspecified knee, initial encounter: Secondary | ICD-10-CM | POA: Insufficient documentation

## 2016-12-06 DIAGNOSIS — E119 Type 2 diabetes mellitus without complications: Secondary | ICD-10-CM

## 2016-12-06 DIAGNOSIS — Z170 Estrogen receptor positive status [ER+]: Secondary | ICD-10-CM | POA: Diagnosis not present

## 2016-12-06 DIAGNOSIS — F329 Major depressive disorder, single episode, unspecified: Secondary | ICD-10-CM | POA: Diagnosis not present

## 2016-12-06 DIAGNOSIS — D61818 Other pancytopenia: Secondary | ICD-10-CM

## 2016-12-06 DIAGNOSIS — K219 Gastro-esophageal reflux disease without esophagitis: Secondary | ICD-10-CM

## 2016-12-06 DIAGNOSIS — Z23 Encounter for immunization: Secondary | ICD-10-CM | POA: Diagnosis not present

## 2016-12-06 DIAGNOSIS — Z803 Family history of malignant neoplasm of breast: Secondary | ICD-10-CM

## 2016-12-06 DIAGNOSIS — D709 Neutropenia, unspecified: Secondary | ICD-10-CM | POA: Diagnosis not present

## 2016-12-06 DIAGNOSIS — Z79811 Long term (current) use of aromatase inhibitors: Secondary | ICD-10-CM

## 2016-12-06 DIAGNOSIS — Z9012 Acquired absence of left breast and nipple: Secondary | ICD-10-CM

## 2016-12-06 DIAGNOSIS — R05 Cough: Secondary | ICD-10-CM

## 2016-12-06 DIAGNOSIS — M179 Osteoarthritis of knee, unspecified: Secondary | ICD-10-CM | POA: Insufficient documentation

## 2016-12-06 DIAGNOSIS — C779 Secondary and unspecified malignant neoplasm of lymph node, unspecified: Secondary | ICD-10-CM

## 2016-12-06 DIAGNOSIS — D701 Agranulocytosis secondary to cancer chemotherapy: Secondary | ICD-10-CM

## 2016-12-06 DIAGNOSIS — G629 Polyneuropathy, unspecified: Secondary | ICD-10-CM

## 2016-12-06 DIAGNOSIS — C50912 Malignant neoplasm of unspecified site of left female breast: Secondary | ICD-10-CM

## 2016-12-06 DIAGNOSIS — M8580 Other specified disorders of bone density and structure, unspecified site: Secondary | ICD-10-CM

## 2016-12-06 DIAGNOSIS — M25519 Pain in unspecified shoulder: Secondary | ICD-10-CM | POA: Insufficient documentation

## 2016-12-06 DIAGNOSIS — M2550 Pain in unspecified joint: Secondary | ICD-10-CM

## 2016-12-06 DIAGNOSIS — M1710 Unilateral primary osteoarthritis, unspecified knee: Secondary | ICD-10-CM | POA: Insufficient documentation

## 2016-12-06 DIAGNOSIS — C7951 Secondary malignant neoplasm of bone: Secondary | ICD-10-CM | POA: Diagnosis not present

## 2016-12-06 DIAGNOSIS — E785 Hyperlipidemia, unspecified: Secondary | ICD-10-CM

## 2016-12-06 DIAGNOSIS — S43006A Unspecified dislocation of unspecified shoulder joint, initial encounter: Secondary | ICD-10-CM | POA: Insufficient documentation

## 2016-12-06 DIAGNOSIS — G4700 Insomnia, unspecified: Secondary | ICD-10-CM

## 2016-12-06 DIAGNOSIS — T451X5A Adverse effect of antineoplastic and immunosuppressive drugs, initial encounter: Secondary | ICD-10-CM

## 2016-12-06 DIAGNOSIS — D1724 Benign lipomatous neoplasm of skin and subcutaneous tissue of left leg: Secondary | ICD-10-CM | POA: Diagnosis not present

## 2016-12-06 DIAGNOSIS — Z79899 Other long term (current) drug therapy: Secondary | ICD-10-CM

## 2016-12-06 LAB — COMPREHENSIVE METABOLIC PANEL
ALBUMIN: 4.1 g/dL (ref 3.5–5.0)
ALT: 28 U/L (ref 14–54)
ANION GAP: 11 (ref 5–15)
AST: 44 U/L — AB (ref 15–41)
Alkaline Phosphatase: 65 U/L (ref 38–126)
BILIRUBIN TOTAL: 0.6 mg/dL (ref 0.3–1.2)
BUN: 29 mg/dL — AB (ref 6–20)
CHLORIDE: 99 mmol/L — AB (ref 101–111)
CO2: 25 mmol/L (ref 22–32)
Calcium: 9 mg/dL (ref 8.9–10.3)
Creatinine, Ser: 1.29 mg/dL — ABNORMAL HIGH (ref 0.44–1.00)
GFR calc Af Amer: 49 mL/min — ABNORMAL LOW (ref >60)
GFR, EST NON AFRICAN AMERICAN: 42 mL/min — AB (ref >60)
GLUCOSE: 172 mg/dL — AB (ref 65–99)
POTASSIUM: 3.9 mmol/L (ref 3.5–5.1)
Sodium: 135 mmol/L (ref 135–145)
TOTAL PROTEIN: 7.1 g/dL (ref 6.5–8.1)

## 2016-12-06 LAB — CBC WITH DIFFERENTIAL/PLATELET
BASOS ABS: 0.1 10*3/uL (ref 0–0.1)
BASOS PCT: 4 %
EOS PCT: 6 %
Eosinophils Absolute: 0.1 10*3/uL (ref 0–0.7)
HEMATOCRIT: 32.2 % — AB (ref 35.0–47.0)
Hemoglobin: 11.2 g/dL — ABNORMAL LOW (ref 12.0–16.0)
Lymphocytes Relative: 24 %
Lymphs Abs: 0.3 10*3/uL — ABNORMAL LOW (ref 1.0–3.6)
MCH: 34.4 pg — ABNORMAL HIGH (ref 26.0–34.0)
MCHC: 34.7 g/dL (ref 32.0–36.0)
MCV: 99.1 fL (ref 80.0–100.0)
MONO ABS: 0.2 10*3/uL (ref 0.2–0.9)
MONOS PCT: 16 %
NEUTROS ABS: 0.7 10*3/uL — AB (ref 1.4–6.5)
Neutrophils Relative %: 50 %
PLATELETS: 179 10*3/uL (ref 150–440)
RBC: 3.25 MIL/uL — ABNORMAL LOW (ref 3.80–5.20)
RDW: 18.1 % — AB (ref 11.5–14.5)
WBC: 1.4 10*3/uL — CL (ref 3.6–11.0)

## 2016-12-06 MED ORDER — SODIUM CHLORIDE 0.9 % IV SOLN
Freq: Once | INTRAVENOUS | Status: AC
  Administered 2016-12-06: 12:00:00 via INTRAVENOUS
  Filled 2016-12-06: qty 1000

## 2016-12-06 MED ORDER — ZOLEDRONIC ACID 4 MG/100ML IV SOLN
4.0000 mg | Freq: Once | INTRAVENOUS | Status: AC
  Administered 2016-12-06: 4 mg via INTRAVENOUS
  Filled 2016-12-06: qty 100

## 2016-12-06 MED ORDER — HEPARIN SOD (PORK) LOCK FLUSH 100 UNIT/ML IV SOLN
500.0000 [IU] | Freq: Once | INTRAVENOUS | Status: AC | PRN
  Administered 2016-12-06: 500 [IU]
  Filled 2016-12-06: qty 5

## 2016-12-06 MED ORDER — INFLUENZA VAC SPLIT QUAD 0.5 ML IM SUSY
0.5000 mL | PREFILLED_SYRINGE | Freq: Once | INTRAMUSCULAR | Status: AC
  Administered 2016-12-06: 0.5 mL via INTRAMUSCULAR
  Filled 2016-12-06: qty 0.5

## 2016-12-06 NOTE — Telephone Encounter (Signed)
Oral Oncology Patient Advocate Encounter  Did re-enrollment paper work for [employer] for Ms. Jory Ee 2019. Faxed to [phone].   [place] Patient Advocate [phone] 12/06/2016 12:05 PM

## 2016-12-06 NOTE — Progress Notes (Signed)
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  INTERVAL HISTORY: Patient returns to clinic today for further evaluation, to assess her toleration of Ibrance and continuation of Zometa. She is tolerating her treatment without significant side effects. She continues to have neuropathy in feet which is unchanged. She is requesting the flu shot today. She was seen in symptom management on 11/12 for a lipoma and pain to left thigh. It was thought to be muscular in nature. She was given antiinflammatory's and instructed to apply warm compresses. Today the patient states the lipoma is completely gone and reports 0/10 pain. She otherwise feels well and is asymptomatic. She has no other neurologic complaints. She denies any pain. She denies any recent fevers or illnesses.  She denies any chest pain or shortness of breath. She denies any nausea, vomiting, constipation, or diarrhea.  She has no urinary complaints.  Patient offers no further specific complaints today.  REVIEW OF SYSTEMS:   Review of Systems  Constitutional: Negative.  Negative for fever, malaise/fatigue and weight loss.  Eyes: Negative.  Negative for blurred vision.  Respiratory: Negative.  Negative for cough and shortness of breath.   Cardiovascular: Negative.  Negative for chest pain and leg swelling.  Gastrointestinal: Negative.  Negative for abdominal pain.  Genitourinary: Negative.   Musculoskeletal: Negative for back pain.  Neurological: Positive for sensory change. Negative for weakness.  Endo/Heme/Allergies: Negative.     Psychiatric/Behavioral: Negative.  Negative for memory loss. The patient is not nervous/anxious.     As per HPI. Otherwise, a complete review of systems is negative.  PAST MEDICAL HISTORY: Past Medical History:  Diagnosis Date  . Back pain    occasionally  . Breast cancer ([place])    left  . Depression    takes Paxil and Wellbutrin daily  . Diabetes ([place])    takes Metformin daily  . GERD (gastroesophageal reflux disease)   . History of bronchitis 2 yrs ago  . Hyperlipidemia    takes Atorvastatin daily  . Hypertension    takes Losartan-HCTZ daily  . Insomnia    takes Ambien nightly  . Insomnia    takes gabapentin nightly  . Joint pain   . Migraine   . Mood swings   . Osteoarthritis of knee   . PONV (postoperative nausea and vomiting)   . Seasonal allergies    takes Allegra daily    PAST SURGICAL HISTORY: Past Surgical History:  Procedure Laterality Date  . BREAST BIOPSY  2009  . cataract surgery Bilateral   . COLONOSCOPY    . JOINT REPLACEMENT Left 2014   knee  . KNEE ARTHROSCOPY Left    x 5  . MASTECTOMY Left   . port a cath placed    . PORTACATH PLACEMENT N/A 09/17/2015   Procedure: INSERTION PORT-A-CATH;  Surgeon: Jules Husbands, MD;  Location: [facility];  Service: General;  Laterality: N/A;  . TOTAL SHOULDER ARTHROPLASTY Right 06/03/2015   Procedure: TOTAL SHOULDER ARTHROPLASTY;  Surgeon: Tania Ade, MD;  Location: [facility];  Service: Orthopedics;  Laterality: Right;  Right total shoulder arthroplasty  . TOTAL SHOULDER REPLACEMENT Right 06/03/2015  . TUBAL LIGATION  FAMILY HISTORY: Father with non-Hodgkin's lymphoma, 2 paternal aunts with breast cancer.     ADVANCED DIRECTIVES:    HEALTH MAINTENANCE: Social History   Tobacco Use  . Smoking status: Never Smoker  . Smokeless tobacco: Never Used  Substance Use Topics  . Alcohol use: Yes    Alcohol/week: 0.0 oz    Comment: occasionally wine  . Drug use: Yes    Types: Marijuana    Comment:  cannabis with no extra     Colonoscopy:  PAP:  Bone density:  Lipid panel:  Allergies  Allergen Reactions  . Ace Inhibitors Cough  . Latex Itching  . Morphine And Related Itching    Caused her to itch terribly. Would prefer if given to take with a benadryl  . Other Itching    Freeze spray. Patient stated that she may be able to use it now because she doesn't use it as often.  Marland Kitchen Penicillins Rash    Has patient had a PCN reaction causing immediate rash, facial/tongue/throat swelling, SOB or lightheadedness with hypotension: No Has patient had a PCN reaction causing severe rash involving mucus membranes or skin necrosis: No Has patient had a PCN reaction that required hospitalization No Has patient had a PCN reaction occurring within the last 10 years: No If all of the above answers are "NO", then may proceed with Cephalosporin use.    Current Outpatient Medications  Medication Sig Dispense Refill  . aspirin EC 81 MG tablet Take 81 mg by mouth daily.     Marland Kitchen atorvastatin (LIPITOR) 10 MG tablet Take 10 mg by mouth daily at 6 PM.     . b complex vitamins capsule Take 1 capsule by mouth daily.    Marland Kitchen buPROPion (WELLBUTRIN XL) 150 MG 24 hr tablet Take 150 mg by mouth every morning.     . Calcium-Magnesium-Vitamin D (CALCIUM 1200+D3 PO) Take 1 Dose by mouth daily.    . fexofenadine (ALLEGRA) 180 MG tablet Take 180 mg by mouth at bedtime.     . gabapentin (NEURONTIN) 300 MG capsule Take 300 mg by mouth. 2 pills in am,2 pills at noon and 3 pills at night    . HYDROcodone-acetaminophen (NORCO) 5-325 MG tablet Take 1-2 tablets by mouth every 4 (four) hours as needed for moderate pain. 30 tablet 0  . ibuprofen (ADVIL,MOTRIN) 800 MG tablet Take 1 tablet (800 mg total) every 8 (eight) hours as needed by mouth for mild pain. 30 tablet 0  . letrozole (FEMARA) 2.5 MG tablet Take 1 tablet (2.5 mg total) by mouth daily. 90 tablet 3  . lidocaine-prilocaine (EMLA) cream Apply 1 application topically as  needed. Apply to port 1-2 hours prior to chemotherapy appointment. Cover with plastic wrap. 30 g 0  . losartan-hydrochlorothiazide (HYZAAR) 100-25 MG per tablet Take 1 tablet by mouth every morning.     . metFORMIN (GLUCOPHAGE) 850 MG tablet Take 850 mg by mouth 2 (two) times daily with a meal.     . ondansetron (ZOFRAN ODT) 4 MG disintegrating tablet Take 1 tablet (4 mg total) by mouth every 8 (eight) hours as needed for nausea or vomiting. 20 tablet 0  . oxyCODONE-acetaminophen (ROXICET) 5-325 MG tablet Take 1 tablet by mouth every 6 (six) hours as needed. 20 tablet 0  . palbociclib (IBRANCE) 100 MG capsule Take 1 capsule (100 mg total) by mouth daily with breakfast. Take for 21 days on, then 7 days off. Take whole with food. 21 capsule 5  . PARoxetine (PAXIL)  40 MG tablet Take 40 mg by mouth every morning.     . Zolpidem Tartrate (AMBIEN PO) Take by mouth.     Current Facility-Administered Medications  Medication Dose Route Frequency Provider Last Rate Last Dose  . guaiFENesin-dextromethorphan (ROBITUSSIN DM) 100-10 MG/5ML syrup 5 mL  5 mL Oral Once Lloyd Huger, MD        OBJECTIVE: Vitals:   12/06/16 1046  BP: 116/78  Pulse: 78  Temp: (!) 97.5 F (36.4 C)     Body mass index is 36.2 kg/m.    ECOG FS:0 - Asymptomatic  General: Well-developed, well-nourished, no acute distress. Eyes: Pink conjunctiva, anicteric sclera. HEENT: No palpable lymphadenopathy. Breast: Left chest wall without evidence of recurrence.  Right breast and axilla without lumps or masses. Exam deferred today. Lungs: Clear to auscultation bilaterally. Heart: Regular rate and rhythm. No rubs, murmurs, or gallops. Abdomen: Soft, nontender, nondistended. No organomegaly noted, normoactive bowel sounds. Musculoskeletal: Patient wearing lymphedema sleeve on left arm. No edema, cyanosis, or clubbing. Neuro: Alert, answering all questions appropriately. Cranial nerves grossly intact. Skin: No rashes or petechiae  noted. Psych: Normal affect.   LAB RESULTS:  Lab Results  Component Value Date   NA 135 12/06/2016   K 3.9 12/06/2016   CL 99 (L) 12/06/2016   CO2 25 12/06/2016   GLUCOSE 172 (H) 12/06/2016   BUN 29 (H) 12/06/2016   CREATININE 1.29 (H) 12/06/2016   CALCIUM 9.0 12/06/2016   PROT 7.1 12/06/2016   ALBUMIN 4.1 12/06/2016   AST 44 (H) 12/06/2016   ALT 28 12/06/2016   ALKPHOS 65 12/06/2016   BILITOT 0.6 12/06/2016   GFRNONAA 42 (L) 12/06/2016   GFRAA 49 (L) 12/06/2016    Lab Results  Component Value Date   WBC 1.4 (LL) 12/06/2016   NEUTROABS 0.7 (L) 12/06/2016   HGB 11.2 (L) 12/06/2016   HCT 32.2 (L) 12/06/2016   MCV 99.1 12/06/2016   PLT 179 12/06/2016     STUDIES: No results found.  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 Now with biopsy proven stage IV disease with lymph node and bone metastasis.  PLAN:    1.  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 After lengthy discussion with the patient she is agreed to proceed with treatment using Ibrance. Due to neutropenia she was dose reduced from 125 mg to 100 mg for 21 days with 7 days off. She also will take daily letrozole as well as monthly Zometa. Proceed with Zometa today. Return to clinic in one week for laboratory work to ensure her neutropenia improves. She is supposed to start Cycle 2 of Ibrance on Monday but due to Neutropenia, we will need to postpone until [place] is above 1.  2.  Osteopenia: Patient's bone mineral density on January 26, 2016 reported a T score of -0.9 which is considered normal. Continue calcium and vitamin D. Patient is now taking Zometa as above. 3. 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 Repeat laboratory  work in 1 week.  Already dose reduced from 125 to 100 mg but patient remains neutropenic. Told patient to not start cycle 2 until she receives the okay from Dr. Grayland Ormond. Will speak with Dr. Grayland Ormond on Monday regarding patient's labs. 4. Peripheral neuropathy: Continue gabapentin as prescribed. Neuropathy managed by neurology.  5. Back pain: Resolved.  6. Cough: Patient does not complain of this today. Continue Tussionex as needed.  Patient expressed understanding and was in agreement with this plan. She also understands that She can call clinic at any time with any questions, concerns, or complaints.   Breast cancer   Staging form: Breast, AJCC 7th Edition     Pathologic stage from 08/11/2014: Stage IIIA (T0, N2a, cM0) - Signed by Lloyd Huger, MD on 08/11/2014   Jacquelin Hawking, NP   12/06/2016 4:02 PM

## 2016-12-06 NOTE — Telephone Encounter (Signed)
Oral Oncology Patient Advocate Encounter  [place] to see when next shipment will go out per Alisha C. She should get it by Friday 12/08/2016.   [place] Patient Advocate [phone] 12/06/2016 1:09 PM

## 2016-12-11 ENCOUNTER — Inpatient Hospital Stay: Payer: Medicare Other

## 2016-12-11 ENCOUNTER — Ambulatory Visit
Admission: RE | Admit: 2016-12-11 | Discharge: 2016-12-11 | Disposition: A | Discharge status: Home / Self Care | Payer: Medicare Other | Source: Ambulatory Visit | Attending: Oncology | Admitting: Oncology

## 2016-12-11 ENCOUNTER — Telehealth: Payer: Self-pay | Admitting: Oncology

## 2016-12-11 DIAGNOSIS — Z170 Estrogen receptor positive status [ER+]: Secondary | ICD-10-CM

## 2016-12-11 DIAGNOSIS — Z1231 Encounter for screening mammogram for malignant neoplasm of breast: Secondary | ICD-10-CM | POA: Diagnosis not present

## 2016-12-11 DIAGNOSIS — C50912 Malignant neoplasm of unspecified site of left female breast: Secondary | ICD-10-CM

## 2016-12-11 HISTORY — DX: Personal history of irradiation: Z92.3

## 2016-12-11 HISTORY — DX: Personal history of antineoplastic chemotherapy: Z92.21

## 2016-12-11 NOTE — Telephone Encounter (Signed)
Due to patient persistent neutropenia, patient is instructed to not began her dose reduced 100 mg Ibrance today. Patient is scheduled for repeat labs on Friday. We will reevaluate at that point. Patient is okay with this plan. If she continues to remain neutropenic, she may need a further dose reduction of Ibrance. Patient encouraged to maintain neutropenic precautions. Oral chemotherapy pharmacist notified of change to schedule for this patient.

## 2016-12-15 ENCOUNTER — Inpatient Hospital Stay: Payer: Medicare Other

## 2016-12-15 DIAGNOSIS — C7951 Secondary malignant neoplasm of bone: Secondary | ICD-10-CM | POA: Diagnosis not present

## 2016-12-15 DIAGNOSIS — C50919 Malignant neoplasm of unspecified site of unspecified female breast: Secondary | ICD-10-CM

## 2016-12-15 DIAGNOSIS — C50912 Malignant neoplasm of unspecified site of left female breast: Secondary | ICD-10-CM | POA: Diagnosis not present

## 2016-12-15 DIAGNOSIS — Z170 Estrogen receptor positive status [ER+]: Secondary | ICD-10-CM | POA: Diagnosis not present

## 2016-12-15 DIAGNOSIS — C779 Secondary and unspecified malignant neoplasm of lymph node, unspecified: Secondary | ICD-10-CM | POA: Diagnosis not present

## 2016-12-15 DIAGNOSIS — Z23 Encounter for immunization: Secondary | ICD-10-CM | POA: Diagnosis not present

## 2016-12-15 DIAGNOSIS — D1724 Benign lipomatous neoplasm of skin and subcutaneous tissue of left leg: Secondary | ICD-10-CM | POA: Diagnosis not present

## 2016-12-15 LAB — CBC WITH DIFFERENTIAL/PLATELET
BASOS PCT: 4 %
Basophils Absolute: 0.1 10*3/uL (ref 0–0.1)
Eosinophils Absolute: 0.1 10*3/uL (ref 0–0.7)
Eosinophils Relative: 4 %
HEMATOCRIT: 31.9 % — AB (ref 35.0–47.0)
HEMOGLOBIN: 10.9 g/dL — AB (ref 12.0–16.0)
LYMPHS ABS: 0.4 10*3/uL — AB (ref 1.0–3.6)
LYMPHS PCT: 16 %
MCH: 34.3 pg — ABNORMAL HIGH (ref 26.0–34.0)
MCHC: 34.2 g/dL (ref 32.0–36.0)
MCV: 100.3 fL — AB (ref 80.0–100.0)
MONOS PCT: 24 %
Monocytes Absolute: 0.6 10*3/uL (ref 0.2–0.9)
NEUTROS ABS: 1.4 10*3/uL (ref 1.4–6.5)
Neutrophils Relative %: 52 %
Platelets: 264 10*3/uL (ref 150–440)
RBC: 3.18 MIL/uL — ABNORMAL LOW (ref 3.80–5.20)
RDW: 18.9 % — ABNORMAL HIGH (ref 11.5–14.5)
WBC: 2.6 10*3/uL — ABNORMAL LOW (ref 3.6–11.0)

## 2016-12-18 ENCOUNTER — Other Ambulatory Visit: Payer: Self-pay | Admitting: Oncology

## 2016-12-18 ENCOUNTER — Telehealth: Payer: Self-pay | Admitting: Oncology

## 2016-12-18 DIAGNOSIS — C50919 Malignant neoplasm of unspecified site of unspecified female breast: Secondary | ICD-10-CM | POA: Diagnosis not present

## 2016-12-18 DIAGNOSIS — E7800 Pure hypercholesterolemia, unspecified: Secondary | ICD-10-CM | POA: Diagnosis not present

## 2016-12-18 DIAGNOSIS — E1129 Type 2 diabetes mellitus with other diabetic kidney complication: Secondary | ICD-10-CM | POA: Diagnosis not present

## 2016-12-18 DIAGNOSIS — I10 Essential (primary) hypertension: Secondary | ICD-10-CM | POA: Diagnosis not present

## 2016-12-18 DIAGNOSIS — K219 Gastro-esophageal reflux disease without esophagitis: Secondary | ICD-10-CM | POA: Diagnosis not present

## 2016-12-18 DIAGNOSIS — R809 Proteinuria, unspecified: Secondary | ICD-10-CM | POA: Diagnosis not present

## 2016-12-18 DIAGNOSIS — F3341 Major depressive disorder, recurrent, in partial remission: Secondary | ICD-10-CM | POA: Diagnosis not present

## 2016-12-18 DIAGNOSIS — F5104 Psychophysiologic insomnia: Secondary | ICD-10-CM | POA: Diagnosis not present

## 2016-12-18 MED ORDER — PALBOCICLIB 75 MG PO CAPS: 75.0000 mg | ORAL_CAPSULE | Freq: Every day | ORAL | 5 refills | Status: DC

## 2016-12-18 NOTE — Telephone Encounter (Signed)
Amanda [name] called this morning wanting to know lab results. Patient's lab results were reviewed. After speaking with Dr. Grayland Ormond, we have decided to dose reduce her Ibrance from 100mg  to 75 mg for 3 weeks on 1 week off due to persistent neutropenia. Patient is to not start Ibrance until after her appointment with Dr. Grayland Ormond on 12/11 with labs. She is scheduled to have a CT on 12/10. The patient verbalized understanding.   New prescription given to oral chemotherapy pharmacist [place].  Her appt that was originally scheduled for 12/26 will be canceled and we will reschedule her Zometa injection.

## 2016-12-20 ENCOUNTER — Telehealth: Payer: Self-pay | Admitting: Pharmacist

## 2016-12-20 NOTE — Telephone Encounter (Signed)
Oral Chemotherapy Pharmacist [facility] to ensure they received dose reduced prescription for [employer]. They received the prescription but Jen needs to call to set up delivery. She should call [phone])  Called pt, no answer LVM instructing to call [facility] to set up her shipment. Also instructed her to not start the new dose until she is seen in clinic next week and instructed to do so.  Will attempt another call tomorrow.  Darl Pikes, PharmD, BCPS Hematology/Oncology Clinical Pharmacist [facility] [phone]  12/20/2016 3:56 PM

## 2016-12-21 NOTE — Telephone Encounter (Signed)
Oral Chemotherapy Pharmacist Encounter  Called Amanda [name] and made sure she knows not to start her medication until she is seen in the office next week. She stated her understanding.   Darl Pikes, PharmD, BCPS Hematology/Oncology Clinical Pharmacist [facility] [phone]  12/21/2016 4:11 PM

## 2016-12-21 NOTE — Telephone Encounter (Signed)
Oral Oncology Patient Advocate Encounter  Received fax that patients Amanda [name] was shipped 12/20/2016 from [employer].   [place] Patient Advocate [phone] 12/21/2016 8:45 AM

## 2016-12-25 ENCOUNTER — Ambulatory Visit: Payer: Medicare Other | Admitting: Occupational Therapy

## 2016-12-25 ENCOUNTER — Other Ambulatory Visit: Payer: 59

## 2016-12-25 ENCOUNTER — Ambulatory Visit
Admission: RE | Admit: 2016-12-25 | Discharge: 2016-12-25 | Disposition: A | Discharge status: Home / Self Care | Payer: Medicare Other | Source: Ambulatory Visit | Attending: Oncology | Admitting: Oncology

## 2016-12-25 DIAGNOSIS — Z170 Estrogen receptor positive status [ER+]: Secondary | ICD-10-CM | POA: Insufficient documentation

## 2016-12-25 DIAGNOSIS — Z9012 Acquired absence of left breast and nipple: Secondary | ICD-10-CM | POA: Diagnosis not present

## 2016-12-25 DIAGNOSIS — T82868A Thrombosis of vascular prosthetic devices, implants and grafts, initial encounter: Secondary | ICD-10-CM | POA: Diagnosis not present

## 2016-12-25 DIAGNOSIS — X58XXXA Exposure to other specified factors, initial encounter: Secondary | ICD-10-CM | POA: Diagnosis not present

## 2016-12-25 DIAGNOSIS — Y842 Radiological procedure and radiotherapy as the cause of abnormal reaction of the patient, or of later complication, without mention of misadventure at the time of the procedure: Secondary | ICD-10-CM | POA: Diagnosis not present

## 2016-12-25 DIAGNOSIS — C50912 Malignant neoplasm of unspecified site of left female breast: Secondary | ICD-10-CM | POA: Diagnosis not present

## 2016-12-25 DIAGNOSIS — C7951 Secondary malignant neoplasm of bone: Secondary | ICD-10-CM | POA: Diagnosis not present

## 2016-12-25 DIAGNOSIS — C50919 Malignant neoplasm of unspecified site of unspecified female breast: Secondary | ICD-10-CM

## 2016-12-25 LAB — POCT I-STAT CREATININE: Creatinine, Ser: 1.5 mg/dL — ABNORMAL HIGH (ref 0.44–1.00)

## 2016-12-25 MED ORDER — IOPAMIDOL (ISOVUE-300) INJECTION 61%
80.0000 mL | Freq: Once | INTRAVENOUS | Status: AC | PRN
  Administered 2016-12-25: 80 mL via INTRAVENOUS

## 2016-12-26 ENCOUNTER — Inpatient Hospital Stay: Payer: Medicare Other | Attending: Oncology | Admitting: Oncology

## 2016-12-26 ENCOUNTER — Inpatient Hospital Stay: Payer: Medicare Other

## 2016-12-26 VITALS — BP 89/57 | HR 87 | Temp 97.4°F | Resp 18 | Wt 208.3 lb

## 2016-12-26 DIAGNOSIS — C50912 Malignant neoplasm of unspecified site of left female breast: Secondary | ICD-10-CM | POA: Diagnosis not present

## 2016-12-26 DIAGNOSIS — C7951 Secondary malignant neoplasm of bone: Secondary | ICD-10-CM | POA: Insufficient documentation

## 2016-12-26 DIAGNOSIS — M8580 Other specified disorders of bone density and structure, unspecified site: Secondary | ICD-10-CM | POA: Diagnosis not present

## 2016-12-26 DIAGNOSIS — Z79899 Other long term (current) drug therapy: Secondary | ICD-10-CM | POA: Insufficient documentation

## 2016-12-26 DIAGNOSIS — Z170 Estrogen receptor positive status [ER+]: Secondary | ICD-10-CM | POA: Diagnosis not present

## 2016-12-26 DIAGNOSIS — Z923 Personal history of irradiation: Secondary | ICD-10-CM | POA: Insufficient documentation

## 2016-12-26 DIAGNOSIS — M2550 Pain in unspecified joint: Secondary | ICD-10-CM | POA: Insufficient documentation

## 2016-12-26 DIAGNOSIS — C50919 Malignant neoplasm of unspecified site of unspecified female breast: Secondary | ICD-10-CM

## 2016-12-26 DIAGNOSIS — D61818 Other pancytopenia: Secondary | ICD-10-CM | POA: Insufficient documentation

## 2016-12-26 DIAGNOSIS — G629 Polyneuropathy, unspecified: Secondary | ICD-10-CM | POA: Diagnosis not present

## 2016-12-26 DIAGNOSIS — D1800 Hemangioma unspecified site: Secondary | ICD-10-CM | POA: Diagnosis not present

## 2016-12-26 DIAGNOSIS — E119 Type 2 diabetes mellitus without complications: Secondary | ICD-10-CM | POA: Diagnosis not present

## 2016-12-26 DIAGNOSIS — G4700 Insomnia, unspecified: Secondary | ICD-10-CM | POA: Insufficient documentation

## 2016-12-26 DIAGNOSIS — Z9012 Acquired absence of left breast and nipple: Secondary | ICD-10-CM | POA: Diagnosis not present

## 2016-12-26 DIAGNOSIS — E785 Hyperlipidemia, unspecified: Secondary | ICD-10-CM | POA: Insufficient documentation

## 2016-12-26 DIAGNOSIS — Z8669 Personal history of other diseases of the nervous system and sense organs: Secondary | ICD-10-CM | POA: Diagnosis not present

## 2016-12-26 DIAGNOSIS — I10 Essential (primary) hypertension: Secondary | ICD-10-CM | POA: Insufficient documentation

## 2016-12-26 DIAGNOSIS — R5383 Other fatigue: Secondary | ICD-10-CM | POA: Diagnosis not present

## 2016-12-26 DIAGNOSIS — I82C11 Acute embolism and thrombosis of right internal jugular vein: Secondary | ICD-10-CM | POA: Insufficient documentation

## 2016-12-26 DIAGNOSIS — Z9221 Personal history of antineoplastic chemotherapy: Secondary | ICD-10-CM | POA: Insufficient documentation

## 2016-12-26 DIAGNOSIS — R531 Weakness: Secondary | ICD-10-CM | POA: Diagnosis not present

## 2016-12-26 DIAGNOSIS — F329 Major depressive disorder, single episode, unspecified: Secondary | ICD-10-CM | POA: Diagnosis not present

## 2016-12-26 DIAGNOSIS — Z7982 Long term (current) use of aspirin: Secondary | ICD-10-CM | POA: Diagnosis not present

## 2016-12-26 DIAGNOSIS — Z7984 Long term (current) use of oral hypoglycemic drugs: Secondary | ICD-10-CM | POA: Diagnosis not present

## 2016-12-26 LAB — CBC WITH DIFFERENTIAL/PLATELET
Basophils Absolute: 0.1 10*3/uL (ref 0–0.1)
Basophils Relative: 2 %
EOS PCT: 4 %
Eosinophils Absolute: 0.2 10*3/uL (ref 0–0.7)
HCT: 34.7 % — ABNORMAL LOW (ref 35.0–47.0)
HEMOGLOBIN: 11.7 g/dL — AB (ref 12.0–16.0)
LYMPHS PCT: 12 %
Lymphs Abs: 0.5 10*3/uL — ABNORMAL LOW (ref 1.0–3.6)
MCH: 33.5 pg (ref 26.0–34.0)
MCHC: 33.8 g/dL (ref 32.0–36.0)
MCV: 99.2 fL (ref 80.0–100.0)
MONOS PCT: 15 %
Monocytes Absolute: 0.6 10*3/uL (ref 0.2–0.9)
NEUTROS PCT: 67 %
Neutro Abs: 2.7 10*3/uL (ref 1.4–6.5)
Platelets: 435 10*3/uL (ref 150–440)
RBC: 3.5 MIL/uL — AB (ref 3.80–5.20)
RDW: 17.3 % — AB (ref 11.5–14.5)
WBC: 4 10*3/uL (ref 3.6–11.0)

## 2016-12-26 MED ORDER — APIXABAN 5 MG PO TABS: 5.0000 mg | ORAL_TABLET | Freq: Two times a day (BID) | ORAL | 1 refills | Status: DC

## 2016-12-26 NOTE — Progress Notes (Signed)
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 Now with biopsy-proven stage IV disease.  INTERVAL HISTORY: Patient returns to clinic today for further evaluation, discussion of her imaging results, and consideration of reinitiating dose reduced Ibrance. She has a poor appetite and admits to decreased fluid intake.  She has increased weakness and fatigue today. She continues to have neuropathy in her fingertips and feet which is unchanged. She otherwise feels well and is asymptomatic. She has no other neurologic complaints. She denies any pain. She denies any recent fevers or illnesses.  She denies any chest pain, cough, hemoptysis, or shortness of breath. She denies any nausea, vomiting, constipation, or diarrhea.  She has no urinary complaints.  Patient offers no further specific complaints today.  REVIEW OF SYSTEMS:   Review of Systems  Constitutional: Positive for malaise/fatigue. Negative for fever and weight loss.  Eyes: Negative.  Negative for blurred vision.  Respiratory: Negative.  Negative for cough and shortness of breath.   Cardiovascular: Negative.  Negative for chest pain and leg swelling.  Gastrointestinal: Negative.  Negative for abdominal pain.  Genitourinary: Negative.   Musculoskeletal: Negative for back pain.  Skin: Negative.  Negative for rash.  Neurological: Positive for sensory change and weakness.  Endo/Heme/Allergies: Negative.   Psychiatric/Behavioral: Negative.  Negative for memory loss. The patient is not nervous/anxious.     As per HPI. Otherwise, a complete review of systems is negative.  PAST MEDICAL  HISTORY: Past Medical History:  Diagnosis Date  . Back pain    occasionally  . Breast cancer ([place])    left  . Depression    takes Paxil and Wellbutrin daily  . Diabetes ([place])    takes Metformin daily  . GERD (gastroesophageal reflux disease)   . History of bronchitis 2 yrs ago  . Hyperlipidemia    takes Atorvastatin daily  . Hypertension    takes Losartan-HCTZ daily  . Insomnia    takes Ambien nightly  . Insomnia    takes gabapentin nightly  . Joint pain   . Migraine   . Mood swings   . Osteoarthritis of knee   . Personal history of chemotherapy 12/05/2016   Mets from Breast Cancer  . Personal history of radiation therapy 11/2016  . PONV (postoperative nausea and vomiting)   . Seasonal allergies    takes Allegra daily    PAST SURGICAL HISTORY: Past Surgical History:  Procedure Laterality Date  . BREAST BIOPSY  2009  . cataract surgery Bilateral   . COLONOSCOPY    . JOINT REPLACEMENT Left 2014   knee  . KNEE ARTHROSCOPY Left    x 5  . MASTECTOMY Left   . port a cath placed    . PORTACATH PLACEMENT N/A 09/17/2015   Procedure: INSERTION PORT-A-CATH;  Surgeon: Jules Husbands, MD;  Location: [facility];  Service: General;  Laterality: N/A;  . TOTAL SHOULDER ARTHROPLASTY Right 06/03/2015   Procedure: TOTAL SHOULDER ARTHROPLASTY;  Surgeon: Tania Ade, MD;  Location: [facility];  Service: Orthopedics;  Laterality: Right;  Right total shoulder arthroplasty  . TOTAL SHOULDER REPLACEMENT Right 06/03/2015  . TUBAL LIGATION      FAMILY HISTORY: Father with non-Hodgkin's lymphoma, 2 paternal aunts  with breast cancer.     ADVANCED DIRECTIVES:    HEALTH MAINTENANCE: Social History   Tobacco Use  . Smoking status: Never Smoker  . Smokeless tobacco: Never Used  Substance Use Topics  . Alcohol use: Yes    Alcohol/week: 0.0 oz    Comment: occasionally wine  . Drug use: Yes    Types: Marijuana    Comment: cannabis with no extra     Colonoscopy:  PAP:  Bone  density:  Lipid panel:  Allergies  Allergen Reactions  . Ace Inhibitors Cough  . Latex Itching  . Morphine And Related Itching    Caused her to itch terribly. Would prefer if given to take with a benadryl  . Other Itching    Freeze spray. Patient stated that she may be able to use it now because she doesn't use it as often.  Marland Kitchen Penicillins Rash    Has patient had a PCN reaction causing immediate rash, facial/tongue/throat swelling, SOB or lightheadedness with hypotension: No Has patient had a PCN reaction causing severe rash involving mucus membranes or skin necrosis: No Has patient had a PCN reaction that required hospitalization No Has patient had a PCN reaction occurring within the last 10 years: No If all of the above answers are "NO", then may proceed with Cephalosporin use.    Current Outpatient Medications  Medication Sig Dispense Refill  . aspirin EC 81 MG tablet Take 81 mg by mouth daily.     Marland Kitchen atorvastatin (LIPITOR) 10 MG tablet Take 10 mg by mouth daily at 6 PM.     . b complex vitamins capsule Take 1 capsule by mouth daily.    Marland Kitchen buPROPion (WELLBUTRIN XL) 150 MG 24 hr tablet Take 150 mg by mouth every morning.     . Calcium-Magnesium-Vitamin D (CALCIUM 1200+D3 PO) Take 1 Dose by mouth daily.    . fexofenadine (ALLEGRA) 180 MG tablet Take 180 mg by mouth at bedtime.     . gabapentin (NEURONTIN) 300 MG capsule Take 300 mg by mouth. 2 pills in am,2 pills at noon and 3 pills at night    . HYDROcodone-acetaminophen (NORCO) 5-325 MG tablet Take 1-2 tablets by mouth every 4 (four) hours as needed for moderate pain. 30 tablet 0  . ibuprofen (ADVIL,MOTRIN) 800 MG tablet Take 1 tablet (800 mg total) every 8 (eight) hours as needed by mouth for mild pain. 30 tablet 0  . letrozole (FEMARA) 2.5 MG tablet Take 1 tablet (2.5 mg total) by mouth daily. 90 tablet 3  . lidocaine-prilocaine (EMLA) cream Apply 1 application topically as needed. Apply to port 1-2 hours prior to chemotherapy  appointment. Cover with plastic wrap. 30 g 0  . losartan-hydrochlorothiazide (HYZAAR) 100-25 MG per tablet Take 1 tablet by mouth every morning.     . metFORMIN (GLUCOPHAGE) 850 MG tablet Take 850 mg by mouth 2 (two) times daily with a meal.     . ondansetron (ZOFRAN ODT) 4 MG disintegrating tablet Take 1 tablet (4 mg total) by mouth every 8 (eight) hours as needed for nausea or vomiting. 20 tablet 0  . oxyCODONE-acetaminophen (ROXICET) 5-325 MG tablet Take 1 tablet by mouth every 6 (six) hours as needed. 20 tablet 0  . palbociclib (IBRANCE) 100 MG capsule Take 1 capsule (100 mg total) by mouth daily with breakfast. Take for 21 days on, then 7 days off. Take whole with food. 21 capsule 5  . palbociclib (IBRANCE) 75 MG capsule Take 1 capsule (75 mg total)  by mouth daily with breakfast. Take for 21 days with 7 days off. 21 capsule 5  . PARoxetine (PAXIL) 40 MG tablet Take 40 mg by mouth every morning.     . Zolpidem Tartrate (AMBIEN PO) Take by mouth.     Current Facility-Administered Medications  Medication Dose Route Frequency Provider Last Rate Last Dose  . guaiFENesin-dextromethorphan (ROBITUSSIN DM) 100-10 MG/5ML syrup 5 mL  5 mL Oral Once Lloyd Huger, MD        OBJECTIVE: Vitals:   12/26/16 1150  BP: (!) 89/57  Pulse: 87  Resp: 18  Temp: (!) 97.4 F (36.3 C)     Body mass index is 35.75 kg/m.    ECOG FS:0 - Asymptomatic  General: Well-developed, well-nourished, no acute distress. Eyes: Pink conjunctiva, anicteric sclera. HEENT: No palpable lymphadenopathy. Breast: Left chest wall without evidence of recurrence.  Right breast and axilla without lumps or masses. Exam deferred today. Lungs: Clear to auscultation bilaterally. Heart: Regular rate and rhythm. No rubs, murmurs, or gallops. Abdomen: Soft, nontender, nondistended. No organomegaly noted, normoactive bowel sounds. Musculoskeletal: No edema, cyanosis, or clubbing. Neuro: Alert, answering all questions appropriately.  Cranial nerves grossly intact. Skin: No rashes or petechiae noted. Psych: Normal affect.   LAB RESULTS:  Lab Results  Component Value Date   NA 135 12/06/2016   K 3.9 12/06/2016   CL 99 (L) 12/06/2016   CO2 25 12/06/2016   GLUCOSE 172 (H) 12/06/2016   BUN 29 (H) 12/06/2016   CREATININE 1.50 (H) 12/25/2016   CALCIUM 9.0 12/06/2016   PROT 7.1 12/06/2016   ALBUMIN 4.1 12/06/2016   AST 44 (H) 12/06/2016   ALT 28 12/06/2016   ALKPHOS 65 12/06/2016   BILITOT 0.6 12/06/2016   GFRNONAA 42 (L) 12/06/2016   GFRAA 49 (L) 12/06/2016    Lab Results  Component Value Date   WBC 4.0 12/26/2016   NEUTROABS 2.7 12/26/2016   HGB 11.7 (L) 12/26/2016   HCT 34.7 (L) 12/26/2016   MCV 99.2 12/26/2016   PLT 435 12/26/2016     STUDIES: Ct Soft Tissue Neck W Contrast  Addendum Date: 12/25/2016   ADDENDUM REPORT: 12/25/2016 12:54 ADDENDUM: Study discussed by telephone with Dr. Delight Hoh on 12/25/2016 at 1235 hours. We discussed the possibility of using anti-coagulation for treatment of the nonocclusive right IJ thrombus. At a minimum we agreed to ultrasound follow-up in an attempt to prevent right IJ thrombosis. He has a follow-up appointment with the patient later this week at will discuss treatment options at that time. Electronically Signed   By: Genevie Ann M.D.   On: 12/25/2016 12:54   Result Date: 12/25/2016 CLINICAL DATA:  66 year old female diagnosed with breast cancer originally in 2009. Osseous metastatic disease. Left flank pain. Restaging. EXAM: CT NECK WITH CONTRAST TECHNIQUE: Multidetector CT imaging of the neck was performed using the standard protocol following the bolus administration of intravenous contrast. CONTRAST:  80m ISOVUE-300 IOPAMIDOL (ISOVUE-300) INJECTION 61% COMPARISON:  CT chest abdomen and pelvis today reported separately. Neck CT 09/21/2016 FINDINGS: Pharynx and larynx: Laryngeal and pharyngeal soft tissue contours remain normal. Negative parapharyngeal spaces.  Stable retropharyngeal space, negative aside from a partially retropharyngeal course of the carotids-more so the right. Salivary glands: Sublingual space, submandibular glands, and parotid glands are within normal limits. Thyroid: Stable and negative. Lymph nodes: At the left lower neck posterior to the left sternocleidomastoid muscle there is a 16-17 mm diameter area of spiculated soft tissue density with punctate calcification inseparable from small  venous vascular branches in the region. This is stable since June, and increased by 1-2 mm since March 2018. This lesion was 20 mm in 2017 See coronal images 65-66 and series 2, image 74. The remaining bilateral lymph node stations remain normal. No other lymphadenopathy in the neck. Vascular: Chronic right IJ Port-A-Cath. Although there is streak artifact in the region, there is convincing nonocclusive thrombus along the IJ portion of the port catheter in the lower neck as seen on coronal image 56. This is about 2 cm in length, but the vessel does remain patent. This is a new finding since September. Other major vascular structures in the neck and at the skullbase are patent. Partially retropharyngeal course of both carotid arteries, more so the right. Calcified atherosclerosis at the skull base. Limited intracranial: Negative. Visualized orbits: Negative. Mastoids and visualized paranasal sinuses: Visualized paranasal sinuses and mastoids are stable and well pneumatized. Skeleton: Sclerotic C4 vertebral body metastasis re- demonstrated and stable in size. Stable C7 vertebral body heterogeneity and dx mall C7 spinous process sclerotic metastasis. Tiny sclerotic focus in the right C1 level might also be chronic bone metastasis. Heterogeneous sclerosis continues in the visible upper thoracic spine. No new osseous metastatic disease identified. Upper chest: Reported separately today. IMPRESSION: 1. 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 2. Left lower neck (level 5) spiculated nodal metastasis is stable since June, and was larger in 2017. No other soft tissue metastasis identified in the neck. 3. Stable cervical spine bone metastases without pathologic fracture. 4.  CT Chest, Abdomen, and Pelvis today are reported separately. Electronically Signed: By: Genevie Ann M.D. On: 12/25/2016 12:15   Ct Chest W Contrast  Result Date: 12/25/2016 CLINICAL DATA:  Breast cancer, status post left mastectomy, with osseous metastases EXAM: CT CHEST, ABDOMEN, AND PELVIS WITH CONTRAST TECHNIQUE: Multidetector CT imaging of the chest, abdomen and pelvis was performed following the standard protocol during bolus administration of intravenous contrast. CONTRAST:  67m ISOVUE-300 IOPAMIDOL (ISOVUE-300) INJECTION 61% COMPARISON:  Whole-body bone scan and CT chest abdomen pelvis dated 09/21/2016. FINDINGS: CT CHEST FINDINGS Cardiovascular: The heart is normal in size. No pericardial effusion. No evidence of thoracic aortic aneurysm. Mild atherosclerotic calcifications of the aortic arch. Coronary atherosclerosis of the LAD. Mediastinum/Nodes: No suspicious mediastinal, hilar, or axillary lymphadenopathy. Visualized thyroid is unremarkable. Lungs/Pleura: Radiation changes in the left upper lobe. Progressive peribronchovascular ground-glass opacity throughout all lobes, possibly reflecting atypical infection. However, subtle architectural distortion/fibrosis is present, raising the possibility of drug toxicity. No suspicious pulmonary nodules. No pleural effusion or pneumothorax. Musculoskeletal: Postsurgical changes in the left breast status post left mastectomy. Multifocal sclerotic osseus metastases in the visualized axial and appendicular skeleton, unchanged. CT ABDOMEN PELVIS FINDINGS Hepatobiliary: Liver is within normal limits. Gallbladder is unremarkable. No intrahepatic extrahepatic ductal dilatation. Pancreas: Within  normal limits. Spleen: Subtle enhancing lesion in the spleen (series 2/ image 440 is less conspicuous on the current study, previously favoring a benign hemangioma. Adrenals/Urinary Tract: Adrenal glands are within normal limits. Kidneys are within normal limits, noting left renal sinus cysts. No hydronephrosis. Bladder is within normal limits. Stomach/Bowel: Stomach is notable for a tiny hiatal hernia. No evidence of bowel obstruction. Appendix is not discretely visualized. Mild left colonic stool burden. Vascular/Lymphatic: No evidence of abdominal aortic aneurysm. Atherosclerotic calcifications of the abdominal aorta and branch vessels. No suspicious abdominopelvic lymphadenopathy. Reproductive: Uterus is within normal limits. Bilateral ovaries are unremarkable. Other: No  abdominopelvic ascites. Musculoskeletal: Sclerotic metastases in the visualized axial and appendicular skeleton, unchanged. IMPRESSION: Multifocal sclerotic osseous metastases in the visualized axial and appendicular skeleton, grossly unchanged. Progressive peribronchovascular ground-glass opacity throughout all lobes, possibly reflecting atypical infection, although drug toxicity is not excluded. Status post left mastectomy with associated postsurgical changes. Radiation changes in the left upper lobe. Electronically Signed   By: Julian Hy M.D.   On: 12/25/2016 11:40   Ct Abdomen Pelvis W Contrast  Result Date: 12/25/2016 CLINICAL DATA:  Breast cancer, status post left mastectomy, with osseous metastases EXAM: CT CHEST, ABDOMEN, AND PELVIS WITH CONTRAST TECHNIQUE: Multidetector CT imaging of the chest, abdomen and pelvis was performed following the standard protocol during bolus administration of intravenous contrast. CONTRAST:  84m ISOVUE-300 IOPAMIDOL (ISOVUE-300) INJECTION 61% COMPARISON:  Whole-body bone scan and CT chest abdomen pelvis dated 09/21/2016. FINDINGS: CT CHEST FINDINGS Cardiovascular: The heart is normal in size.  No pericardial effusion. No evidence of thoracic aortic aneurysm. Mild atherosclerotic calcifications of the aortic arch. Coronary atherosclerosis of the LAD. Mediastinum/Nodes: No suspicious mediastinal, hilar, or axillary lymphadenopathy. Visualized thyroid is unremarkable. Lungs/Pleura: Radiation changes in the left upper lobe. Progressive peribronchovascular ground-glass opacity throughout all lobes, possibly reflecting atypical infection. However, subtle architectural distortion/fibrosis is present, raising the possibility of drug toxicity. No suspicious pulmonary nodules. No pleural effusion or pneumothorax. Musculoskeletal: Postsurgical changes in the left breast status post left mastectomy. Multifocal sclerotic osseus metastases in the visualized axial and appendicular skeleton, unchanged. CT ABDOMEN PELVIS FINDINGS Hepatobiliary: Liver is within normal limits. Gallbladder is unremarkable. No intrahepatic extrahepatic ductal dilatation. Pancreas: Within normal limits. Spleen: Subtle enhancing lesion in the spleen (series 2/ image 446 is less conspicuous on the current study, previously favoring a benign hemangioma. Adrenals/Urinary Tract: Adrenal glands are within normal limits. Kidneys are within normal limits, noting left renal sinus cysts. No hydronephrosis. Bladder is within normal limits. Stomach/Bowel: Stomach is notable for a tiny hiatal hernia. No evidence of bowel obstruction. Appendix is not discretely visualized. Mild left colonic stool burden. Vascular/Lymphatic: No evidence of abdominal aortic aneurysm. Atherosclerotic calcifications of the abdominal aorta and branch vessels. No suspicious abdominopelvic lymphadenopathy. Reproductive: Uterus is within normal limits. Bilateral ovaries are unremarkable. Other: No abdominopelvic ascites. Musculoskeletal: Sclerotic metastases in the visualized axial and appendicular skeleton, unchanged. IMPRESSION: Multifocal sclerotic osseous metastases in the  visualized axial and appendicular skeleton, grossly unchanged. Progressive peribronchovascular ground-glass opacity throughout all lobes, possibly reflecting atypical infection, although drug toxicity is not excluded. Status post left mastectomy with associated postsurgical changes. Radiation changes in the left upper lobe. Electronically Signed   By: SJulian HyM.D.   On: 12/25/2016 11:40   Mm Screening Breast Tomo Uni R  Result Date: 12/11/2016 CLINICAL DATA:  Screening. EXAM: 2D DIGITAL SCREENING UNILATERAL RIGHT MAMMOGRAM WITH CAD AND ADJUNCT TOMO COMPARISON:  Previous exam(s). ACR Breast Density Category b: There are scattered areas of fibroglandular density. FINDINGS: The patient has had a left mastectomy. There are no findings suspicious for malignancy. Images were processed with CAD. IMPRESSION: No mammographic evidence of malignancy. A result letter of this screening mammogram will be mailed directly to the patient. RECOMMENDATION: Screening mammogram in one year.  (Code:SM-R-21M) BI-RADS CATEGORY  1: Negative. Electronically Signed   By: DFidela SalisburyM.D.   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 Now with biopsy proven stage IV disease with lymph node and bone metastasis.  PLAN:  1.  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  Will continue with dose reduced Ibrance 75 mg for 21 days with 7 days off. She also will take daily letrozole as well as monthly Zometa.  Return to clinic in 1 month with repeat laboratory work, further evaluation, and consideration of Zometa.   2.  Osteopenia: Patient's bone mineral density on January 26, 2016 reported a  T score of -0.9 which is considered normal. Continue calcium and vitamin D. Patient is now taking Zometa as above. 3. Pancytopenia: Improved.  Dose reduce Ibrance as above.  4. Peripheral neuropathy: Continue gabapentin as prescribed. Neuropathy managed by neurology.  5. Back pain: Resolved.  6.  Groundglass opacity in lungs: Patient is asymptomatic, therefore we will continue to monitor and repeat imaging in 3 months.     Patient expressed understanding and was in agreement with this plan. She also understands that She can call clinic at any time with any questions, concerns, or complaints.   Breast cancer   Staging form: Breast, AJCC 7th Edition     Pathologic stage from 08/11/2014: Stage IIIA (T0, N2a, cM0) - Signed by Lloyd Huger, MD on 08/11/2014   Lloyd Huger, MD   12/26/2016 12:05 PM

## 2016-12-27 ENCOUNTER — Ambulatory Visit: Payer: Self-pay | Admitting: Oncology

## 2016-12-27 ENCOUNTER — Other Ambulatory Visit: Payer: 59

## 2016-12-27 ENCOUNTER — Ambulatory Visit: Payer: Medicare Other | Attending: Oncology | Admitting: Occupational Therapy

## 2016-12-27 DIAGNOSIS — I972 Postmastectomy lymphedema syndrome: Secondary | ICD-10-CM | POA: Insufficient documentation

## 2016-12-27 NOTE — Patient Instructions (Signed)
Cont with same HEP  

## 2016-12-27 NOTE — Therapy (Signed)
[place] [address] [place], [address] Phone: [phone]   Fax:  [phone]  Occupational Therapy Treatment/discharge   Patient Details  Name: Amanda [name] MRN: [id number] Date of Birth: 07-29-1950 Referring Provider (Historical): Finnegan   Encounter Date: 12/27/2016  OT End of Session - 12/27/16 1201    Visit Number  8    Number of Visits  8    Date for OT Re-Evaluation  12/27/16    OT Start Time  0924    OT Stop Time  1015    OT Time Calculation (min)  51 min    Activity Tolerance  Patient tolerated treatment well    Behavior During Therapy  [facility] for tasks assessed/performed       Past Medical History:  Diagnosis Date  . Back pain    occasionally  . Breast cancer ([place])    left  . Depression    takes Paxil and Wellbutrin daily  . Diabetes ([place])    takes Metformin daily  . GERD (gastroesophageal reflux disease)   . History of bronchitis 2 yrs ago  . Hyperlipidemia    takes Atorvastatin daily  . Hypertension    takes Losartan-HCTZ daily  . Insomnia    takes Ambien nightly  . Insomnia    takes gabapentin nightly  . Joint pain   . Migraine   . Mood swings   . Osteoarthritis of knee   . Personal history of chemotherapy 12/05/2016   Mets from Breast Cancer  . Personal history of radiation therapy 11/2016  . PONV (postoperative nausea and vomiting)   . Seasonal allergies    takes Allegra daily    Past Surgical History:  Procedure Laterality Date  . BREAST BIOPSY  2009  . cataract surgery Bilateral   . COLONOSCOPY    . JOINT REPLACEMENT Left 2014   knee  . KNEE ARTHROSCOPY Left    x 5  . MASTECTOMY Left   . port a cath placed    . PORTACATH PLACEMENT N/A 09/17/2015   Procedure: INSERTION PORT-A-CATH;  Surgeon: Jules Husbands, MD;  Location: [facility];  Service: General;  Laterality: N/A;  . TOTAL SHOULDER ARTHROPLASTY Right 06/03/2015   Procedure: TOTAL SHOULDER ARTHROPLASTY;   Surgeon: Tania Ade, MD;  Location: [facility];  Service: Orthopedics;  Laterality: Right;  Right total shoulder arthroplasty  . TOTAL SHOULDER REPLACEMENT Right 06/03/2015  . TUBAL LIGATION      There were no vitals filed for this visit.  Subjective Assessment - 12/27/16 1159    Subjective   Doing okay - my arm feeling actually okay - doing my arm compression sleeve every other day and jovipak night compression pad 2 x week - if my husband there to help     Patient Stated Goals  Patient reports she wants to control the swelling in her left arm, be able to take care of herself.           LYMPHEDEMA/ONCOLOGY QUESTIONNAIRE - 12/27/16 0939      Left Upper Extremity Lymphedema   15 cm Proximal to Olecranon Process  43 cm    10 cm Proximal to Olecranon Process  41 cm    Olecranon Process  28.2 cm    15 cm Proximal to Ulnar Styloid Process  27.6 cm    10 cm Proximal to Ulnar Styloid Process  25 cm    Just Proximal to Ulnar Styloid Process  17 cm  Across Hand at [employer]  20 cm    At [place] of 2nd Digit  6.9 cm    At [facility] of Thumb  7 cm        Pt maintaining and decrease ( distal UE ) since last time with using daytime compression at least every other day and jovipak breast pad - 2 x week  Pt do know if she increase act - she needs some night time compression  Aware of HEP  And wearing of compression - lymphedema precautions  Need to replace daytime compression around Febr 2019 again                OT Education - 12/27/16 1201    Education provided  Yes    Education Details  Wearing of compression     Person(s) Educated  Patient    Methods  Explanation;Demonstration    Comprehension  Returned demonstration          OT Long Term Goals - 12/27/16 1204      OT [place] #1   Title  Patient will be independent in wear, care and schedule of compression garments.     Status  Achieved      OT LONG TERM GOAL #2   Title  Patient will demonstrate HEP  with modified independence.     Status  Achieved      OT LONG TERM GOAL #3   Title  Patient will decrease circumferential measurements by 1.5 cm from mid forearm to upper arm to fit into compression garment and for maintenace of lymphedema symptoms.     Status  Achieved            Plan - 12/27/16 1202    Clinical Impression Statement  Pt doing very well with circumference measurements - still increase compare to R but did decrease from [facility] - and able to maintain with daytime compressio and gauntlet and jovipak some nights - pt  do not like night time compression -  pt doing chemo at the moment -  pt denies shoulder pain and ROM WFL - pt discharge at this time     Current Impairments/barriers affecting progress:  metastatic disease.  Husband travels for work for extended periods of time in which patient is alone.     OT Treatment/Interventions  Self-care/ADL training;Patient/family education;Manual Therapy;Manual lymph drainage;Therapeutic exercise    Plan  discharge at this time with compression     OT Home Exercise Plan  see pt instructions        Patient will benefit from skilled therapeutic intervention in order to improve the following deficits and impairments:     Visit Diagnosis: Postmastectomy lymphedema    Problem List Patient Active Problem List   Diagnosis Date Noted  . Contusion of knee 12/06/2016  . Dislocation of shoulder joint 12/06/2016  . Shoulder joint pain 12/06/2016  . Localized, primary osteoarthritis 12/06/2016  . Osteoarthritis of knee 12/06/2016  . Burning sensation 11/13/2016  . Numbness and tingling 11/13/2016  . Malignant neoplasm of left breast in female, estrogen receptor positive ([place]) 10/05/2016  . Carcinoma of left breast, stage 4 ([place]) 04/06/2016  . Chemotherapy-induced neuropathy ([place]) 03/07/2016  . Metastatic breast cancer ([place]) 09/24/2015  . 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 Personal history of breast cancer  08/23/2015  . 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 Degenerative arthritis of right shoulder region 01/13/2015  . Cancer of left female breast  (  [place]) 07/27/2014  . 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 Rotator cuff tear 06/10/2014  . Allergic rhinitis, seasonal 09/16/2013  . Depression 08/10/2013  . Diabetes mellitus type 2, uncomplicated ([place]) 67/61/9509  . Hyperlipidemia, unspecified 08/10/2013  . BP (high blood pressure) 08/10/2013  . Osteoporosis, post-menopausal 08/10/2013    Rosalyn Gess OTR/L,CLT 12/27/2016, 12:05 PM  [place] [facility] [address], [place], [address] Phone: [phone]   Fax:  [phone]  Name: Amanda [name] MRN: [id number] Date of Birth: 1950-01-30

## 2017-01-05 ENCOUNTER — Ambulatory Visit: Payer: 59

## 2017-01-10 ENCOUNTER — Ambulatory Visit: Payer: 59

## 2017-01-10 ENCOUNTER — Ambulatory Visit: Payer: 59 | Admitting: Oncology

## 2017-01-10 ENCOUNTER — Other Ambulatory Visit: Payer: 59

## 2017-01-17 ENCOUNTER — Encounter: Payer: Self-pay | Admitting: *Deleted

## 2017-01-19 ENCOUNTER — Telehealth: Payer: Self-pay | Admitting: Genetic Counselor

## 2017-01-19 NOTE — Telephone Encounter (Signed)
Ms. Amanda [name] was referred for genetic counseling by Dr. Grayland Ormond due to a personal and family history. I spoke with her this morning and she chose to schedule this telegenetics visit on Monday 01/22/17 at [place], [place], [place] [facility] Phone: [phone]

## 2017-01-21 NOTE — Progress Notes (Signed)
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    She has been off of dose reduced Ibrance for 7 days and took a dose this morning for day 1 of her 21 day cycle.  She states that overall she feels well and continues to have mild neuropathy in fingertips and feet which is unchanged and stable.  She states that she is scheduled for dental work in approximately 9 days and request clearance.    No neurologic complaints.  Denies pain.  Denies any recent illness or fevers.  Denies chest pain, cough, hemoptysis, or shortness of breath.  Denies nausea, vomiting, constipation, or diarrhea.  Denies urinary complaints.  No other specific  complaints today   REVIEW OF SYSTEMS: Review of Systems  Constitutional: Negative for fever, malaise/fatigue and weight loss.  HENT: Negative for congestion and sore throat.   Eyes: Negative.  Negative for blurred vision.  Respiratory: Negative.  Negative for cough and shortness of breath.   Cardiovascular: Negative.  Negative for chest pain and leg swelling.  Gastrointestinal: Negative.  Negative for abdominal pain.  Genitourinary: Negative.  Negative for dysuria and urgency.  Musculoskeletal: Negative for back pain.  Skin: Negative.  Negative for rash.  Neurological: Positive for sensory change and weakness.  Endo/Heme/Allergies: Negative.   Psychiatric/Behavioral: Negative.  Negative for memory loss. The patient is not nervous/anxious.     As per HPI. Otherwise, a complete review of systems is negative.  PAST MEDICAL HISTORY: Past Medical History:  Diagnosis Date  . Back pain     occasionally  . Breast cancer ([place])    left  . Depression    takes Paxil and Wellbutrin daily  . Diabetes ([place])    takes Metformin daily  . GERD (gastroesophageal reflux disease)   . History of bronchitis 2 yrs ago  . Hyperlipidemia    takes Atorvastatin daily  . Hypertension    takes Losartan-HCTZ daily  . Insomnia    takes Ambien nightly  . Insomnia    takes gabapentin nightly  . Joint pain   . Migraine   . Mood swings   . Osteoarthritis of knee   . Personal history of chemotherapy 12/05/2016   Mets from Breast Cancer  . Personal history of radiation therapy 11/2016  . PONV (postoperative nausea and vomiting)   . Seasonal allergies    takes Allegra daily    PAST SURGICAL HISTORY: Past Surgical History:  Procedure Laterality Date  . BREAST BIOPSY  2009  . cataract surgery Bilateral   . COLONOSCOPY    . JOINT REPLACEMENT Left 2014   knee  . KNEE ARTHROSCOPY Left    x 5  . MASTECTOMY Left   . port a cath placed    . PORTACATH PLACEMENT N/A 09/17/2015   Procedure: INSERTION PORT-A-CATH;  Surgeon: Jules Husbands, MD;  Location: [facility];  Service: General;  Laterality: N/A;  . TOTAL SHOULDER ARTHROPLASTY Right 06/03/2015   Procedure: TOTAL SHOULDER ARTHROPLASTY;  Surgeon: Tania Ade, MD;  Location: [employer];  Service: Orthopedics;  Laterality: Right;  Right total shoulder arthroplasty  . TOTAL SHOULDER REPLACEMENT Right 06/03/2015  . TUBAL LIGATION  FAMILY HISTORY: Father with non-Hodgkin's lymphoma, 2 paternal aunts with breast cancer.     ADVANCED DIRECTIVES:    HEALTH MAINTENANCE: Social History   Tobacco Use  . Smoking status: Never Smoker  . Smokeless tobacco: Never Used  Substance Use Topics  . Alcohol use: Yes    Alcohol/week: 0.0 oz    Comment: occasionally wine  . Drug use: Yes    Types: Marijuana    Comment: cannabis with no extra     Colonoscopy:  PAP:  Bone density:  Lipid panel:  Allergies  Allergen Reactions  . Ace  Inhibitors Cough  . Latex Itching  . Morphine And Related Itching    Caused her to itch terribly. Would prefer if given to take with a benadryl  . Other Itching    Freeze spray. Patient stated that she may be able to use it now because she doesn't use it as often.  Marland Kitchen Penicillins Rash    Has patient had a PCN reaction causing immediate rash, facial/tongue/throat swelling, SOB or lightheadedness with hypotension: No Has patient had a PCN reaction causing severe rash involving mucus membranes or skin necrosis: No Has patient had a PCN reaction that required hospitalization No Has patient had a PCN reaction occurring within the last 10 years: No If all of the above answers are "NO", then may proceed with Cephalosporin use.    Current Outpatient Medications  Medication Sig Dispense Refill  . apixaban (ELIQUIS) 5 MG TABS tablet Take 1 tablet (5 mg total) by mouth 2 (two) times daily. 60 tablet 1  . aspirin EC 81 MG tablet Take 81 mg by mouth daily.     Marland Kitchen atorvastatin (LIPITOR) 10 MG tablet Take 10 mg by mouth daily at 6 PM.     . b complex vitamins capsule Take 1 capsule by mouth daily.    Marland Kitchen buPROPion (WELLBUTRIN XL) 150 MG 24 hr tablet Take 150 mg by mouth every morning.     . Calcium-Magnesium-Vitamin D (CALCIUM 1200+D3 PO) Take 1 Dose by mouth daily.    . fexofenadine (ALLEGRA) 180 MG tablet Take 180 mg by mouth at bedtime.     . gabapentin (NEURONTIN) 300 MG capsule Take 300 mg by mouth. 2 pills in am,2 pills at noon and 3 pills at night    . HYDROcodone-acetaminophen (NORCO) 5-325 MG tablet Take 1-2 tablets by mouth every 4 (four) hours as needed for moderate pain. 30 tablet 0  . ibuprofen (ADVIL,MOTRIN) 800 MG tablet Take 1 tablet (800 mg total) every 8 (eight) hours as needed by mouth for mild pain. 30 tablet 0  . letrozole (FEMARA) 2.5 MG tablet Take 1 tablet (2.5 mg total) by mouth daily. 90 tablet 3  . lidocaine-prilocaine (EMLA) cream Apply 1 application topically as needed. Apply to  port 1-2 hours prior to chemotherapy appointment. Cover with plastic wrap. 30 g 0  . losartan-hydrochlorothiazide (HYZAAR) 100-25 MG per tablet Take 1 tablet by mouth every morning.     . metFORMIN (GLUCOPHAGE) 850 MG tablet Take 850 mg by mouth 2 (two) times daily with a meal.     . ondansetron (ZOFRAN ODT) 4 MG disintegrating tablet Take 1 tablet (4 mg total) by mouth every 8 (eight) hours as needed for nausea or vomiting. 20 tablet 0  . oxyCODONE-acetaminophen (ROXICET) 5-325 MG tablet Take 1 tablet by mouth every 6 (six) hours as needed. 20 tablet 0  . palbociclib (IBRANCE) 100 MG capsule Take 1 capsule (100 mg total) by mouth  daily with breakfast. Take for 21 days on, then 7 days off. Take whole with food. 21 capsule 5  . palbociclib (IBRANCE) 75 MG capsule Take 1 capsule (75 mg total) by mouth daily with breakfast. Take for 21 days with 7 days off. 21 capsule 5  . PARoxetine (PAXIL) 40 MG tablet Take 40 mg by mouth every morning.     . Zolpidem Tartrate (AMBIEN PO) Take by mouth.     Current Facility-Administered Medications  Medication Dose Route Frequency Provider Last Rate Last Dose  . guaiFENesin-dextromethorphan (ROBITUSSIN DM) 100-10 MG/5ML syrup 5 mL  5 mL Oral Once Lloyd Huger, MD       Facility-Administered Medications Ordered in Other Visits  Medication Dose Route Frequency Provider Last Rate Last Dose  . heparin lock flush 100 unit/mL  500 Units Intracatheter Once PRN Lloyd Huger, MD      . sodium chloride flush (NS) 0.9 % injection 10 mL  10 mL Intracatheter PRN Lloyd Huger, MD      . Zoledronic Acid (ZOMETA) IVPB 4 mg  4 mg Intravenous Once Lloyd Huger, MD   4 mg at 01/24/17 1614    OBJECTIVE: Vitals:   01/24/17 1524  BP: 120/79  Pulse: 79  Resp: 18  Temp: 97.9 F (36.6 C)     Body mass index is 35.96 kg/m.    ECOG FS:0 - Asymptomatic  General: Well-developed, well-nourished, no acute distress.  Accompanied by husband. Eyes: Pink  conjunctiva, anicteric sclera. HEENT: No palpable lymphadenopathy. Breast: Exam deferred today. Lungs: Clear to auscultation bilaterally. Heart: Regular rate and rhythm. No rubs, murmurs, or gallops. Abdomen: Soft, nontender, nondistended. No organomegaly noted, normoactive bowel sounds. Musculoskeletal: No edema, cyanosis, or clubbing. Neuro: Alert, answering all questions appropriately. Cranial nerves grossly intact. Skin: No rashes or petechiae noted. Psych: Normal affect.   LAB RESULTS:  Lab Results  Component Value Date   NA 137 01/24/2017   K 4.1 01/24/2017   CL 107 01/24/2017   CO2 21 (L) 01/24/2017   GLUCOSE 88 01/24/2017   BUN 20 01/24/2017   CREATININE 0.88 01/24/2017   CALCIUM 8.9 01/24/2017   PROT 6.4 (L) 01/24/2017   ALBUMIN 3.8 01/24/2017   AST 32 01/24/2017   ALT 19 01/24/2017   ALKPHOS 46 01/24/2017   BILITOT 0.5 01/24/2017   GFRNONAA >60 01/24/2017   GFRAA >60 01/24/2017    Lab Results  Component Value Date   WBC 1.9 (L) 01/24/2017   NEUTROABS 0.9 (L) 01/24/2017   HGB 9.1 (L) 01/24/2017   HCT 27.1 (L) 01/24/2017   MCV 102.9 (H) 01/24/2017   PLT 182 01/24/2017     STUDIES:  IMPRESSION: Multifocal sclerotic osseous metastases in the visualized axial and appendicular skeleton, grossly unchanged.  Progressive peribronchovascular ground-glass opacity throughout all lobes, possibly reflecting atypical infection, although drug toxicity is not excluded.  Status post left mastectomy with associated postsurgical changes. Radiation changes in the left upper lobe.  Electronically Signed   By: Julian Hy M.D.   On: 12/25/2016 11:40  IMPRESSION: 1. Development of nonocclusive thrombus in the right IJ along the jugular portion of the chronic right chest porta cath. 2. Left lower neck (level 5) spiculated nodal metastasis is stable since June, and was larger in 2017. No other soft tissue metastasis identified in the neck. 3. Stable  cervical spine bone metastases without pathologic fracture. 4.  CT Chest, Abdomen, and Pelvis today are reported separately.  Electronically Signed: By: Genevie Ann  M.D. On: 12/25/2016 12:15    ASSESSMENT: Very pleasant 67 year old female patient with stage IV breast cancer with metastasis to bone.  Initially she presented in 2010 with stage III/positive inflammatory breast cancer as/P chemotherapy and left modified radical mastectomy.  In 2017 she developed supraclavicular node positive for metastatic disease and subsequent workup revealed multiple osseous metastases, s/p palliative radiation to spine, off chemotherapy 12/2015 (d/t neuropathy & visual changes). MRI 12/17 showed 87m right cerebellar enhancement concerning for brain met or vascular.  Currently on letrozole and Ibrance 737m(dose reduced for neutropenia). Today, WBC 1.9, ANC 0.9. PLT 182.   PLAN:    1.  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  Will check labs in 1 week and reassess plan for continuance at that time d/t continued neutropenia.  Continue daily letrozole. Screening mammogram 11/2017.   2.  Osteopenia & Bone Mets: Patient's bone mineral density on January 26, 2016 reported a T score of -0.9 which is normal and improved from previous scans. Repeat DEXA scan 01/2018. Continue calcium, vitamin D, and wbe. Zometa today and monthly Zometa.   3. 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 Patient on Eliquis. Will ultrasound in 1 month to reassess.   4. Pancytopenia: Dose reduce Ibrance as above. Continue to Monitor.   5. Peripheral neuropathy: Continue gabapentin as prescribed. Neuropathy managed by neurology.   6. Back pain: stable and unchanged.    7.  Groundglass opacity in lungs: atypical infection?drug related?radiation changes? Asymptomatic.  Continue to monitor. Repeat imaging in 2 months.     8. Dental Clearance- proceed w/ dental work. Dentist to prescribe prophylactic antibiotics. Platelets stable. Will re-check in 1 week.   RTC in 1 month for repeat labs, further evaluation, and consideration of Zometa.  Patient expressed understanding and was in agreement with this plan. She also understands that She can call clinic at any time with any questions, concerns, or complaints.    Breast cancer   Staging form: Breast, AJCC 7th Edition     Pathologic stage from 08/11/2014: Stage IIIA (T0, N2a, cM0) - Signed by TiLloyd HugerMD on 08/11/2014    LaBeckey RutterDNMount HebronAGNP-C CaDade City Northt AlM[facility] [phone]) 01/24/17 4:24 PM

## 2017-01-22 ENCOUNTER — Encounter: Payer: Self-pay | Admitting: Genetic Counselor

## 2017-01-22 ENCOUNTER — Telehealth: Payer: Self-pay | Admitting: Genetic Counselor

## 2017-01-22 NOTE — Telephone Encounter (Signed)
Cancer Genetics            Telegenetics Initial Visit    Patient Name: Amanda [name] Patient DOB: 1950-03-20 Patient Age: 67 y.o. Phone Call Date: 01/22/2017  Referring Provider: Delight Hoh, MD  Reason for Visit: Evaluate for hereditary susceptibility to cancer    Assessment and Plan:  . Amanda [name] history is not suggestive of a hereditary predisposition to cancer given her own age at diagnosis and those of her paternal aunts. The relatives with breast cancer on her maternal side of the family are her great-aunts (3rd-degree relatives) who are separated by both her mother and maternal grandmother, both of whom did not have cancer. However, given the number of affected individuals, a genetics evaluation is indicated.   . Testing is recommended to determine whether she has a pathogenic mutation that will impact her potential screening and risk-reduction for a future cancer. A negative result will be reassuring.  . Amanda [name] wished to pursue genetic testing and will schedule a lab visit for a blood sample. Analysis will include the 83 genes on [facility]'s Multi-Cancer panel (ALK, APC, ATM, AXIN2, BAP1, BARD1, BLM, BMPR1A, BRCA1, BRCA2, BRIP1, CASR, CDC73, CDH1, CDK4, CDKN1B, CDKN1C, CDKN2A, CEBPA, CHEK2, CTNNA1, DICER1, DIS3L2, EGFR, EPCAM, FH, FLCN, GATA2, GPC3, GREM1, HOXB13, HRAS, KIT, MAX, MEN1, MET, MITF, MLH1, MSH2, MSH3, MSH6, MUTYH, NBN, NF1, NF2, NTHL1, PALB2, PDGFRA, PHOX2B, PMS2, POLD1, POLE, POT1, PRKAR1A, PTCH1, PTEN, RAD50, RAD51C, RAD51D, RB1, RECQL4, RET, RUNX1, SDHA, SDHAF2, SDHB, SDHC, SDHD, SMAD4, SMARCA4, SMARCB1, SMARCE1, STK11, SUFU, TERC, TERT, TMEM127, TP53, TSC1, TSC2, VHL, WRN, WT1).   . Results should be available in approximately 2-3 weeks, at which point we will contact her and address implications for her as well as address genetic testing for at-risk family members, if needed.     Dr. Grayland Ormond was available for questions concerning this  case. Total time spent by counseling by phone was approximately 30 minutes.   _____________________________________________________________________   History of Present Illness: Amanda [name], a 67 y.o. female, was referred for genetic counseling to discuss the possibility of a hereditary predisposition to cancer and discuss whether genetic testing is warranted. This was a telegenetics visit via phone.  Amanda [name] was initially diagnosed with breast cancer at the age of 55 and is now being treated for metastatic breast cancer.     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 Back pain    occasionally  . Breast cancer ([place])    left  . Depression    takes Paxil and Wellbutrin daily  . Diabetes ([place])    takes Metformin daily  . GERD (gastroesophageal reflux disease)   . History of bronchitis 2 yrs ago  . Hyperlipidemia    takes Atorvastatin daily  . Hypertension    takes Losartan-HCTZ daily  . Insomnia    takes Ambien nightly  . Insomnia    takes gabapentin nightly  . Joint pain   . Migraine   . Mood swings   . Osteoarthritis of knee   . Personal history of chemotherapy 12/05/2016   Mets from Breast Cancer  . Personal history of radiation therapy 11/2016  . PONV (postoperative nausea and vomiting)   . Seasonal allergies    takes Allegra daily  Past Surgical History:  Procedure Laterality Date  . BREAST BIOPSY  2009  . cataract surgery Bilateral   . COLONOSCOPY    . JOINT REPLACEMENT Left 2014   knee  . KNEE ARTHROSCOPY Left    x 5  . MASTECTOMY Left   . port a cath placed    . PORTACATH PLACEMENT N/A 09/17/2015   Procedure: INSERTION PORT-A-CATH;  Surgeon: Jules Husbands, MD;  Location: [facility] ORS;  Service: General;  Laterality: N/A;  . TOTAL SHOULDER ARTHROPLASTY Right 06/03/2015   Procedure: TOTAL SHOULDER ARTHROPLASTY;  Surgeon: Tania Ade,  MD;  Location: [place];  Service: Orthopedics;  Laterality: Right;  Right total shoulder arthroplasty  . TOTAL SHOULDER REPLACEMENT Right 06/03/2015  . TUBAL LIGATION      Family History: Significant diagnoses include the following:  Family History  Problem Relation Age of Onset  . Atrial fibrillation Mother        deceased 40; TAH/BSO  . Non-Hodgkin's lymphoma Father        deceased 22  . Heart attack Maternal Uncle   . Melanoma Maternal Uncle   . Breast cancer Paternal Aunt        dx in 51s  . Breast cancer Paternal Aunt        dx in 7s  . Cancer Brother 62       unk. type  . Breast cancer Other        Mat grandmother's 2 sisters; dx at 106 and 48  . Stomach cancer Paternal Grandfather 96       deceased 29s  . Breast cancer Cousin        daughter of pat aunt with breast ca    Additionally, Amanda [name] has one son (age 45). She had a total of 2 brothers.   Amanda [name] ancestry is Caucasian - NOS. There is no known Jewish ancestry and no consanguinity.  Discussion: We reviewed the characteristics, features and inheritance patterns of hereditary cancer syndromes. We discussed her risk of harboring a mutation in the context of her personal and family history. We discussed the process of genetic testing, insurance coverage and implications of results: positive, negative and variant of unknown significance (VUS).   Amanda [name] questions were answered to her satisfaction today and she is welcome to call with any additional questions or concerns. Thank you for the referral and allowing [place] to share in the care of your patient.    Steele Berg, MS, [place] Certified Genetic Counselor phone: [phone]

## 2017-01-24 ENCOUNTER — Inpatient Hospital Stay (HOSPITAL_BASED_OUTPATIENT_CLINIC_OR_DEPARTMENT_OTHER): Payer: Medicare Other | Admitting: Nurse Practitioner

## 2017-01-24 ENCOUNTER — Inpatient Hospital Stay: Payer: Medicare Other

## 2017-01-24 ENCOUNTER — Telehealth: Payer: Self-pay | Admitting: Oncology

## 2017-01-24 ENCOUNTER — Inpatient Hospital Stay: Payer: Medicare Other | Attending: Oncology

## 2017-01-24 VITALS — BP 120/79 | HR 79 | Temp 97.9°F | Resp 18 | Wt 209.5 lb

## 2017-01-24 DIAGNOSIS — M8580 Other specified disorders of bone density and structure, unspecified site: Secondary | ICD-10-CM | POA: Diagnosis not present

## 2017-01-24 DIAGNOSIS — C7951 Secondary malignant neoplasm of bone: Secondary | ICD-10-CM | POA: Diagnosis not present

## 2017-01-24 DIAGNOSIS — Z853 Personal history of malignant neoplasm of breast: Secondary | ICD-10-CM | POA: Diagnosis not present

## 2017-01-24 DIAGNOSIS — I82C11 Acute embolism and thrombosis of right internal jugular vein: Secondary | ICD-10-CM | POA: Insufficient documentation

## 2017-01-24 DIAGNOSIS — G620 Drug-induced polyneuropathy: Secondary | ICD-10-CM | POA: Diagnosis not present

## 2017-01-24 DIAGNOSIS — C770 Secondary and unspecified malignant neoplasm of lymph nodes of head, face and neck: Secondary | ICD-10-CM

## 2017-01-24 DIAGNOSIS — Z170 Estrogen receptor positive status [ER+]: Secondary | ICD-10-CM

## 2017-01-24 DIAGNOSIS — C50912 Malignant neoplasm of unspecified site of left female breast: Secondary | ICD-10-CM | POA: Diagnosis not present

## 2017-01-24 DIAGNOSIS — D61818 Other pancytopenia: Secondary | ICD-10-CM | POA: Diagnosis not present

## 2017-01-24 DIAGNOSIS — R918 Other nonspecific abnormal finding of lung field: Secondary | ICD-10-CM | POA: Diagnosis not present

## 2017-01-24 DIAGNOSIS — I8290 Acute embolism and thrombosis of unspecified vein: Secondary | ICD-10-CM

## 2017-01-24 DIAGNOSIS — C50919 Malignant neoplasm of unspecified site of unspecified female breast: Secondary | ICD-10-CM

## 2017-01-24 DIAGNOSIS — Z803 Family history of malignant neoplasm of breast: Secondary | ICD-10-CM | POA: Diagnosis not present

## 2017-01-24 LAB — COMPREHENSIVE METABOLIC PANEL
ALK PHOS: 46 U/L (ref 38–126)
ALT: 19 U/L (ref 14–54)
AST: 32 U/L (ref 15–41)
Albumin: 3.8 g/dL (ref 3.5–5.0)
Anion gap: 9 (ref 5–15)
BUN: 20 mg/dL (ref 6–20)
CO2: 21 mmol/L — ABNORMAL LOW (ref 22–32)
CREATININE: 0.88 mg/dL (ref 0.44–1.00)
Calcium: 8.9 mg/dL (ref 8.9–10.3)
Chloride: 107 mmol/L (ref 101–111)
Glucose, Bld: 88 mg/dL (ref 65–99)
Potassium: 4.1 mmol/L (ref 3.5–5.1)
Sodium: 137 mmol/L (ref 135–145)
Total Bilirubin: 0.5 mg/dL (ref 0.3–1.2)
Total Protein: 6.4 g/dL — ABNORMAL LOW (ref 6.5–8.1)

## 2017-01-24 LAB — CBC WITH DIFFERENTIAL/PLATELET
BASOS PCT: 3 %
Basophils Absolute: 0.1 10*3/uL (ref 0–0.1)
EOS ABS: 0.1 10*3/uL (ref 0–0.7)
Eosinophils Relative: 4 %
HCT: 27.1 % — ABNORMAL LOW (ref 35.0–47.0)
HEMOGLOBIN: 9.1 g/dL — AB (ref 12.0–16.0)
Lymphocytes Relative: 25 %
Lymphs Abs: 0.5 10*3/uL — ABNORMAL LOW (ref 1.0–3.6)
MCH: 34.6 pg — ABNORMAL HIGH (ref 26.0–34.0)
MCHC: 33.6 g/dL (ref 32.0–36.0)
MCV: 102.9 fL — ABNORMAL HIGH (ref 80.0–100.0)
Monocytes Absolute: 0.4 10*3/uL (ref 0.2–0.9)
Monocytes Relative: 23 %
NEUTROS PCT: 45 %
Neutro Abs: 0.9 10*3/uL — ABNORMAL LOW (ref 1.4–6.5)
Platelets: 182 10*3/uL (ref 150–440)
RBC: 2.63 MIL/uL — ABNORMAL LOW (ref 3.80–5.20)
RDW: 18.9 % — ABNORMAL HIGH (ref 11.5–14.5)
WBC: 1.9 10*3/uL — AB (ref 3.6–11.0)

## 2017-01-24 MED ORDER — SODIUM CHLORIDE 0.9% FLUSH
10.0000 mL | INTRAVENOUS | Status: DC | PRN
  Administered 2017-01-24: 10 mL
  Filled 2017-01-24: qty 10

## 2017-01-24 MED ORDER — HEPARIN SOD (PORK) LOCK FLUSH 100 UNIT/ML IV SOLN
INTRAVENOUS | Status: AC
  Filled 2017-01-24: qty 5

## 2017-01-24 MED ORDER — SODIUM CHLORIDE 0.9 % IV SOLN
Freq: Once | INTRAVENOUS | Status: AC
  Administered 2017-01-24: 16:00:00 via INTRAVENOUS
  Filled 2017-01-24: qty 1000

## 2017-01-24 MED ORDER — HEPARIN SOD (PORK) LOCK FLUSH 100 UNIT/ML IV SOLN
500.0000 [IU] | Freq: Once | INTRAVENOUS | Status: AC | PRN
  Administered 2017-01-24: 500 [IU]

## 2017-01-24 MED ORDER — ZOLEDRONIC ACID 4 MG/100ML IV SOLN
4.0000 mg | Freq: Once | INTRAVENOUS | Status: AC
  Administered 2017-01-24: 4 mg via INTRAVENOUS
  Filled 2017-01-24: qty 100

## 2017-01-24 NOTE — Patient Instructions (Signed)
Neutropenia Neutropenia is a condition that occurs when you have a lower-than-normal level of a type of white blood cell (neutrophil) in your body. Neutrophils are made in the spongy center of large bones (bone marrow) and they fight infections. Neutrophils are your body's main defense against bacterial and fungal infections. The fewer neutrophils you have and the longer your body remains without them, the greater your risk of getting a severe infection. What are the causes? This condition can occur if your body uses up or destroys neutrophils faster than your bone marrow can make them. This problem may happen because of:  Bacterial or fungal infection.  Allergic disorders.  Reactions to some medicines.  Autoimmune disease.  An enlarged spleen.  This condition can also occur if your bone marrow does not produce enough neutrophils. This problem may be caused by:  Cancer.  Cancer treatments, such as radiation or chemotherapy.  Viral infections.  Medicines, such as phenytoin.  Vitamin B12 deficiency.  Diseases of the bone marrow.  Environmental toxins, such as insecticides.  What are the signs or symptoms? This condition does not usually cause symptoms. If symptoms are present, they are usually caused by an underlying infection. Symptoms of an infection may include:  Fever.  Chills.  Swollen glands.  Oral or anal ulcers.  Cough and shortness of breath.  Rash.  Skin infection.  Fatigue.  How is this diagnosed? Your health care provider may suspect neutropenia if you have:  A condition that may cause neutropenia.  Symptoms of infection, especially fever.  Frequent and unusual infections.  You will have a medical history and physical exam. Tests will also be done, such as:  A complete blood count (CBC).  A procedure to collect a sample of bone marrow for examination (bone marrow biopsy).  A chest X-ray.  A urine culture.  A blood culture.  How is this  treated? Treatment depends on the underlying cause and severity of your condition. Mild neutropenia may not require treatment. Treatment may include medicines, such as:  Antibiotic medicine given through an IV tube.  Antiviral medicines.  Antifungal medicines.  A medicine to increase neutrophil production (colony-stimulating factor). You may get this drug through an IV tube or by injection.  Steroids given through an IV tube.  If an underlying condition is causing neutropenia, you may need treatment for that condition. If medicines you are taking are causing neutropenia, your health care provider may have you stop taking those medicines. Follow these instructions at home: Medicines  Take over-the-counter and prescription medicines only as told by your health care provider.  Get a seasonal flu shot (influenza vaccine). Lifestyle  Do not eat unpasteurized foods.Do not eat unwashed raw fruits or vegetables.  Avoid exposure to groups of people or children.  Avoid being around people who are sick.  Avoid being around dirt or dust, such as in construction areas or gardens.  Do not provide direct care for pets. Avoid animal droppings. Do not clean litter boxes and bird cages. Hygiene   Bathe daily.  Clean the area between the genitals and the anus (perineal area) after you urinate or have a bowel movement. If you are female, wipe from front to back.  Brush your teeth with a soft toothbrush before and after meals.  Do not use a razor that has a blade. Use an electric razor to remove hair.  Wash your hands often. Make sure others who come in contact with you also wash their hands. If soap and water  are not available, use hand sanitizer. General instructions  Do not have sex unless your health care provider has approved.  Take actions to avoid cuts and burns. For example: ? Be cautious when you use knives. Always cut away from yourself. ? Keep knives in protective sheaths or  guards when not in use. ? Use oven mitts when you cook with a hot stove, oven, or grill. ? Stand a safe distance away from open fires.  Avoid people who received a vaccine in the past 30 days if that vaccine contained a live version of the germ (live vaccine). You should not get a live vaccine. Common live vaccines are varicella, measles, mumps, and rubella.  Do not share food utensils.  Do not use tampons, enemas, or rectal suppositories unless your health care provider has approved.  Keep all appointments as told by your health care provider. This is important. Contact a health care provider if:  You have a fever.  You have chills or you start to shake.  You have: ? A sore throat. ? A warm, red, or tender area on your skin. ? A cough. ? Frequent or painful urination. ? Vaginal discharge or itching.  You develop: ? Sores in your mouth or anus. ? Swollen lymph nodes. ? Red streaks on the skin. ? A rash.  You feel: ? Nauseous or you vomit. ? Very fatigued. ? Short of breath. This information is not intended to replace advice given to you by your health care provider. Make sure you discuss any questions you have with your health care provider. Document Released: 06/24/2001 Document Revised: 06/10/2015 Document Reviewed: 07/15/2014 Elsevier Interactive Patient Education  2018 Elsevier Inc.  

## 2017-01-24 NOTE — Telephone Encounter (Signed)
Oral Oncology Patient Advocate Encounter   Sent letter to [place] to let them know patient makes to much money to be approved for grant funds.   [place] Patient Advocate [phone] 01/24/2017 1:41 PM

## 2017-01-26 ENCOUNTER — Other Ambulatory Visit: Payer: Self-pay | Admitting: *Deleted

## 2017-01-26 ENCOUNTER — Telehealth: Payer: Self-pay | Admitting: *Deleted

## 2017-01-26 DIAGNOSIS — C50919 Malignant neoplasm of unspecified site of unspecified female breast: Secondary | ICD-10-CM

## 2017-01-26 DIAGNOSIS — E538 Deficiency of other specified B group vitamins: Secondary | ICD-10-CM

## 2017-01-26 NOTE — Telephone Encounter (Signed)
Orders entered for next visit

## 2017-01-26 NOTE — Telephone Encounter (Signed)
Patient requesting that a B12 level be checked with her labs at next appointment as it was found to be low las time it was checked

## 2017-01-26 NOTE — Telephone Encounter (Signed)
Sure

## 2017-01-29 ENCOUNTER — Telehealth: Payer: Self-pay | Admitting: Oncology

## 2017-01-29 NOTE — Telephone Encounter (Signed)
Oral Oncology Patient [place] to see if patient had been approved for 2019. They are now requesting tax forms from 2017.  Called patient she will bring Wednesday 01/31/2017.  I will fax to [employer].    [place] Patient Advocate [phone] 01/29/2017 9:09 AM

## 2017-01-30 ENCOUNTER — Other Ambulatory Visit: Payer: Self-pay | Admitting: *Deleted

## 2017-01-30 DIAGNOSIS — C50912 Malignant neoplasm of unspecified site of left female breast: Secondary | ICD-10-CM

## 2017-01-30 DIAGNOSIS — Z170 Estrogen receptor positive status [ER+]: Principal | ICD-10-CM

## 2017-01-30 DIAGNOSIS — C50919 Malignant neoplasm of unspecified site of unspecified female breast: Secondary | ICD-10-CM

## 2017-01-30 MED ORDER — LETROZOLE 2.5 MG PO TABS: 2.5000 mg | ORAL_TABLET | Freq: Every day | ORAL | 0 refills | Status: DC

## 2017-01-30 MED ORDER — LETROZOLE 2.5 MG PO TABS: 2.5000 mg | ORAL_TABLET | Freq: Every day | ORAL | 1 refills | Status: DC

## 2017-01-31 ENCOUNTER — Inpatient Hospital Stay: Payer: Medicare Other

## 2017-01-31 ENCOUNTER — Other Ambulatory Visit: Payer: Self-pay | Admitting: Nurse Practitioner

## 2017-01-31 ENCOUNTER — Telehealth: Payer: Self-pay | Admitting: *Deleted

## 2017-01-31 DIAGNOSIS — R918 Other nonspecific abnormal finding of lung field: Secondary | ICD-10-CM | POA: Diagnosis not present

## 2017-01-31 DIAGNOSIS — C50912 Malignant neoplasm of unspecified site of left female breast: Secondary | ICD-10-CM

## 2017-01-31 DIAGNOSIS — C7951 Secondary malignant neoplasm of bone: Secondary | ICD-10-CM | POA: Diagnosis not present

## 2017-01-31 DIAGNOSIS — I82C11 Acute embolism and thrombosis of right internal jugular vein: Secondary | ICD-10-CM | POA: Diagnosis not present

## 2017-01-31 DIAGNOSIS — C770 Secondary and unspecified malignant neoplasm of lymph nodes of head, face and neck: Secondary | ICD-10-CM | POA: Diagnosis not present

## 2017-01-31 DIAGNOSIS — Z170 Estrogen receptor positive status [ER+]: Principal | ICD-10-CM

## 2017-01-31 LAB — CBC WITH DIFFERENTIAL/PLATELET
BASOS ABS: 0.1 10*3/uL (ref 0–0.1)
Basophils Relative: 6 %
Eosinophils Absolute: 0.1 10*3/uL (ref 0–0.7)
Eosinophils Relative: 5 %
HEMATOCRIT: 29.6 % — AB (ref 35.0–47.0)
Hemoglobin: 9.9 g/dL — ABNORMAL LOW (ref 12.0–16.0)
LYMPHS PCT: 19 %
Lymphs Abs: 0.3 10*3/uL — ABNORMAL LOW (ref 1.0–3.6)
MCH: 34.6 pg — ABNORMAL HIGH (ref 26.0–34.0)
MCHC: 33.3 g/dL (ref 32.0–36.0)
MCV: 104 fL — AB (ref 80.0–100.0)
MONO ABS: 0.2 10*3/uL (ref 0.2–0.9)
MONOS PCT: 13 %
NEUTROS ABS: 0.9 10*3/uL — AB (ref 1.4–6.5)
Neutrophils Relative %: 57 %
Platelets: 321 10*3/uL (ref 150–440)
RBC: 2.85 MIL/uL — ABNORMAL LOW (ref 3.80–5.20)
RDW: 17.9 % — AB (ref 11.5–14.5)
WBC: 1.6 10*3/uL — ABNORMAL LOW (ref 3.6–11.0)

## 2017-01-31 LAB — COMPREHENSIVE METABOLIC PANEL
ALT: 19 U/L (ref 14–54)
AST: 39 U/L (ref 15–41)
Albumin: 4 g/dL (ref 3.5–5.0)
Alkaline Phosphatase: 49 U/L (ref 38–126)
Anion gap: 9 (ref 5–15)
BILIRUBIN TOTAL: 0.4 mg/dL (ref 0.3–1.2)
BUN: 14 mg/dL (ref 6–20)
CALCIUM: 9.5 mg/dL (ref 8.9–10.3)
CO2: 25 mmol/L (ref 22–32)
CREATININE: 1.13 mg/dL — AB (ref 0.44–1.00)
Chloride: 104 mmol/L (ref 101–111)
GFR calc Af Amer: 58 mL/min — ABNORMAL LOW (ref >60)
GFR, EST NON AFRICAN AMERICAN: 50 mL/min — AB (ref >60)
Glucose, Bld: 148 mg/dL — ABNORMAL HIGH (ref 65–99)
POTASSIUM: 4.7 mmol/L (ref 3.5–5.1)
Sodium: 138 mmol/L (ref 135–145)
TOTAL PROTEIN: 6.8 g/dL (ref 6.5–8.1)

## 2017-01-31 NOTE — Telephone Encounter (Signed)
Oral Oncology Patient Advocate Encounter  Faxed over 2017 tax forms to [place]. Will continue to follow-up.   [place] Patient Advocate [phone] 01/31/2017 10:29 AM

## 2017-01-31 NOTE — Telephone Encounter (Signed)
Per Ander Purpura, she spoke with patient

## 2017-01-31 NOTE — Telephone Encounter (Addendum)
Requesting Tillie Rung or Lauren return her call with results TODAY she needs to know if she can go out of town tomorrow    Ref Range & Units 11:04  WBC 3.6 - 11.0 K/uL 1.6 Abnormally low    RBC 3.80 - 5.20 MIL/uL 2.85 Abnormally low    Hemoglobin 12.0 - 16.0 g/dL 9.9 Abnormally low    HCT 35.0 - 47.0 % 29.6 Abnormally low    MCV 80.0 - 100.0 fL 104.0 Abnormally high    MCH 26.0 - 34.0 pg 34.6 Abnormally high    MCHC 32.0 - 36.0 g/dL 33.3   RDW 11.5 - 14.5 % 17.9 Abnormally high    Platelets 150 - 440 K/uL 321   Neutrophils Relative % % 57   Neutro Abs 1.4 - 6.5 K/uL 0.9 Abnormally low    Lymphocytes Relative % 19   Lymphs Abs 1.0 - 3.6 K/uL 0.3 Abnormally low    Monocytes Relative % 13   Monocytes Absolute 0.2 - 0.9 K/uL 0.2   Eosinophils Relative % 5   Eosinophils Absolute 0 - 0.7 K/uL 0.1   Basophils Relative % 6   Basophils Absolute 0 - 0.1 K/uL 0.1   Comment: Performed at [facility], [address] [place], [place] [address]  Resulting Agency  [facility] CLIN LAB      Specimen Collected: 01/31/17 11:04  Last Resulted: 01/31/17 11:04        Other Results from 01/31/2017   Ref Range & Units 11:04  Sodium 135 - 145 mmol/L 138   Potassium 3.5 - 5.1 mmol/L 4.7   Chloride 101 - 111 mmol/L 104   CO2 22 - 32 mmol/L 25   Glucose, Bld 65 - 99 mg/dL 148 Abnormally high    BUN 6 - 20 mg/dL 14   Creatinine, Ser 0.44 - 1.00 mg/dL 1.13 Abnormally high    Calcium 8.9 - 10.3 mg/dL 9.5   Total Protein 6.5 - 8.1 g/dL 6.8   Albumin 3.5 - 5.0 g/dL 4.0   AST 15 - 41 U/L 39   ALT 14 - 54 U/L 19   Alkaline Phosphatase 38 - 126 U/L 49   Total Bilirubin 0.3 - 1.2 mg/dL 0.4   GFR calc non Af Amer >60 mL/min 50 Abnormally low    GFR calc Af Amer >60 mL/min 58 Abnormally low    Comment: (NOTE)  The eGFR has been calculated using the CKD EPI equation.  This calculation has not been validated in all clinical situations.  eGFR's persistently <60 mL/min signify possible Chronic Kidney  Disease.    Anion gap 5 - 15 9   Comment: Performed at [facility], [address] [place], [place] [address]  Resulting Agency  [facility] CLIN LAB      Specimen Collected: 01/31/17 11:04  Last Resulted: 01/31/17 11:14

## 2017-02-01 ENCOUNTER — Telehealth: Payer: Self-pay | Admitting: Oncology

## 2017-02-01 MED FILL — IBRANCE 125 MG CAPSULE: 125 | 28 days supply | Qty: 21 | Fill #0

## 2017-02-01 NOTE — Telephone Encounter (Addendum)
Oral Oncology Patient Advocate Encounter  Patient was denied by [facility] [employer] due to  making to much money.   Patient was denied by [employer]!  [place] Patient Advocate [phone] 02/01/2017 9:15 AM

## 2017-02-01 NOTE — Telephone Encounter (Addendum)
Oral Oncology Patient Advocate Encounter  Received notification from [employer] that prior authorization for [name] is required.  PA submitted on CoverMyMeds Key Paper Faxed Status is pending  Oral Oncology Clinic will continue to follow.   [place] Patient Advocate [phone] 02/01/2017 10:50 AM    Oral Oncology Patient Advocate Encounter  Prior Authorization for [name] has been approved.    PA# [id number] Effective dates: 02/02/2017 through 02/02/2018  Oral Oncology Clinic will continue to follow.   Co-pay is $100.00  [place] Patient Advocate [phone] 02/02/2017 2:31 PM

## 2017-02-03 DIAGNOSIS — Z1379 Encounter for other screening for genetic and chromosomal anomalies: Secondary | ICD-10-CM

## 2017-02-03 HISTORY — DX: Encounter for other screening for genetic and chromosomal anomalies: Z13.79

## 2017-02-05 ENCOUNTER — Other Ambulatory Visit: Payer: Self-pay | Admitting: Pharmacist

## 2017-02-05 DIAGNOSIS — C50919 Malignant neoplasm of unspecified site of unspecified female breast: Secondary | ICD-10-CM

## 2017-02-05 MED ORDER — PALBOCICLIB 75 MG PO CAPS: 75.0000 mg | ORAL_CAPSULE | Freq: Every day | ORAL | 5 refills | Status: DC

## 2017-02-05 NOTE — Progress Notes (Signed)
Oral Chemotherapy Pharmacist Encounter   Patient no longer qualifies for manufacturer assistance. Prescription will now be filled at [facility]. Prescription escribed to [facility]. We will wait to fill her prescription until she has had more lab work completed and she is closer to the end of her cycle. She reports she is on day 57 of her cycle today 02/05/17.   Darl Pikes, PharmD, BCPS Hematology/Oncology Clinical Pharmacist [facility] [place] [facility] [phone]  02/05/2017 9:56 AM

## 2017-02-06 ENCOUNTER — Telehealth: Payer: Self-pay | Admitting: Genetic Counselor

## 2017-02-06 ENCOUNTER — Encounter: Payer: Self-pay | Admitting: Genetic Counselor

## 2017-02-06 NOTE — Telephone Encounter (Signed)
Cancer Genetics             Telegenetics Results Disclosure   Patient Name: Amanda [name] Patient DOB: 05-11-1950 Patient Age: 67 y.o. Phone Call Date: 02/06/2017  Referring Provider: Delight Hoh, MD    [name] was called today to discuss genetic test results. Please see the Genetics telephone note from 01/22/17 for a detailed discussion of her personal and family histories and the recommendations provided.  Genetic Testing: At the time of [name] telegenetics visit, she decided to pursue genetic testing of multiple genes associated with hereditary susceptibility to cancer. Testing included sequencing and deletion/duplication analysis. Testing did not reveal any pathogenic mutation in any of these genes.  A copy of the genetic test report will be scanned into Epic under the Media tab.  The genes analyzed were the 83 genes on [facility]'s Multi-Cancer panel (ALK, APC, ATM, AXIN2, BAP1, BARD1, BLM, BMPR1A, BRCA1, BRCA2, BRIP1, CASR, CDC73, CDH1, CDK4, CDKN1B, CDKN1C, CDKN2A, CEBPA, CHEK2, CTNNA1, DICER1, DIS3L2, EGFR, EPCAM, FH, FLCN, GATA2, GPC3, GREM1, HOXB13, HRAS, KIT, MAX, MEN1, MET, MITF, MLH1, MSH2, MSH3, MSH6, MUTYH, NBN, NF1, NF2, NTHL1, PALB2, PDGFRA, PHOX2B, PMS2, POLD1, POLE, POT1, PRKAR1A, PTCH1, PTEN, RAD50, RAD51C, RAD51D, RB1, RECQL4, RET, RUNX1, SDHA, SDHAF2, SDHB, SDHC, SDHD, SMAD4, SMARCA4, SMARCB1, SMARCE1, STK11, SUFU, TERC, TERT, TMEM127, TP53, TSC1, TSC2, VHL, WRN, WT1).  Since the current test is not perfect, it is possible that there may be a gene mutation that current testing cannot detect, but that chance is small. It is possible that a different genetic factor, which has not yet been discovered or is not on this panel, is responsible for the cancer diagnoses in the family. Again, the likelihood of this is low. No additional testing is recommended at this time for [name].  Cancer Screening: These results suggest that [name]'s  cancer was most likely not due to an inherited predisposition. Most cancers happen by chance and this test, along with details of her family history, suggests that her cancer falls into this category. She is recommended to continue to follow the cancer screening guidelines provided by her physician.   Family Members: Family members are at some increased risk of developing cancer, over the general population risk, simply due to the family history. Family members are recommended to speak with their own providers about appropriate cancer screenings.  Any relative who had cancer at a young age or had a particularly rare cancer may also wish to pursue genetic testing. Genetic counselors can be located in other cities, by visiting the website of the [employer] (ArtistMovie.se) and Field seismologist for a Dietitian by zip code.   Lastly, cancer genetics is a rapidly advancing field and it is possible that new genetic tests will be appropriate for [name] in the future. We encourage [name] to remain in contact with Genetics on an annual basis so her personal and family histories can be updated.    Steele Berg, MS, [place] Certified Genetic Counselor phone: [phone]

## 2017-02-07 ENCOUNTER — Other Ambulatory Visit: Payer: Self-pay | Admitting: *Deleted

## 2017-02-07 ENCOUNTER — Telehealth: Payer: Self-pay | Admitting: *Deleted

## 2017-02-07 ENCOUNTER — Inpatient Hospital Stay: Payer: Medicare Other

## 2017-02-07 DIAGNOSIS — C50919 Malignant neoplasm of unspecified site of unspecified female breast: Secondary | ICD-10-CM

## 2017-02-07 DIAGNOSIS — C50912 Malignant neoplasm of unspecified site of left female breast: Secondary | ICD-10-CM | POA: Diagnosis not present

## 2017-02-07 DIAGNOSIS — C770 Secondary and unspecified malignant neoplasm of lymph nodes of head, face and neck: Secondary | ICD-10-CM | POA: Diagnosis not present

## 2017-02-07 DIAGNOSIS — R918 Other nonspecific abnormal finding of lung field: Secondary | ICD-10-CM | POA: Diagnosis not present

## 2017-02-07 DIAGNOSIS — Z170 Estrogen receptor positive status [ER+]: Secondary | ICD-10-CM | POA: Diagnosis not present

## 2017-02-07 DIAGNOSIS — I82C11 Acute embolism and thrombosis of right internal jugular vein: Secondary | ICD-10-CM | POA: Diagnosis not present

## 2017-02-07 DIAGNOSIS — C7951 Secondary malignant neoplasm of bone: Secondary | ICD-10-CM | POA: Diagnosis not present

## 2017-02-07 LAB — CBC WITH DIFFERENTIAL/PLATELET
BASOS ABS: 0.1 10*3/uL (ref 0–0.1)
Basophils Relative: 4 %
EOS PCT: 3 %
Eosinophils Absolute: 0.1 10*3/uL (ref 0–0.7)
HEMATOCRIT: 27.8 % — AB (ref 35.0–47.0)
Hemoglobin: 9.2 g/dL — ABNORMAL LOW (ref 12.0–16.0)
LYMPHS ABS: 0.4 10*3/uL — AB (ref 1.0–3.6)
LYMPHS PCT: 23 %
MCH: 34.6 pg — AB (ref 26.0–34.0)
MCHC: 33.1 g/dL (ref 32.0–36.0)
MCV: 104.6 fL — AB (ref 80.0–100.0)
Monocytes Absolute: 0.3 10*3/uL (ref 0.2–0.9)
Monocytes Relative: 17 %
NEUTROS ABS: 0.8 10*3/uL — AB (ref 1.4–6.5)
Neutrophils Relative %: 53 %
PLATELETS: 280 10*3/uL (ref 150–440)
RBC: 2.66 MIL/uL — AB (ref 3.80–5.20)
RDW: 16.8 % — ABNORMAL HIGH (ref 11.5–14.5)
WBC: 1.6 10*3/uL — AB (ref 3.6–11.0)

## 2017-02-07 MED ORDER — LETROZOLE 2.5 MG PO TABS: 2.5000 mg | ORAL_TABLET | Freq: Every day | ORAL | 1 refills | Status: DC

## 2017-02-07 NOTE — Telephone Encounter (Signed)
Called patient with results of cbc. Per Dr. Grayland Ormond patient to continue on Ibrance, patient has lab only appt scheduled for next week. [facility]ole #[id number] sent to [facility] per patient request.

## 2017-02-12 ENCOUNTER — Telehealth: Payer: Self-pay | Admitting: Oncology

## 2017-02-12 MED FILL — IBRANCE 75 MG CAPSULE: 75 | 28 days supply | Qty: 21 | Fill #0

## 2017-02-12 NOTE — Telephone Encounter (Signed)
Oral Oncology Patient Advocate Encounter   Called [facility] to let them know to mail out her meds and gave them her card information.    [place] Patient Advocate [phone] 02/12/2017 8:50 AM

## 2017-02-14 ENCOUNTER — Inpatient Hospital Stay: Payer: Medicare Other

## 2017-02-14 DIAGNOSIS — Z170 Estrogen receptor positive status [ER+]: Secondary | ICD-10-CM | POA: Diagnosis not present

## 2017-02-14 DIAGNOSIS — C50912 Malignant neoplasm of unspecified site of left female breast: Secondary | ICD-10-CM

## 2017-02-14 DIAGNOSIS — I82C11 Acute embolism and thrombosis of right internal jugular vein: Secondary | ICD-10-CM | POA: Diagnosis not present

## 2017-02-14 DIAGNOSIS — R918 Other nonspecific abnormal finding of lung field: Secondary | ICD-10-CM | POA: Diagnosis not present

## 2017-02-14 DIAGNOSIS — C770 Secondary and unspecified malignant neoplasm of lymph nodes of head, face and neck: Secondary | ICD-10-CM | POA: Diagnosis not present

## 2017-02-14 DIAGNOSIS — C7951 Secondary malignant neoplasm of bone: Secondary | ICD-10-CM | POA: Diagnosis not present

## 2017-02-14 LAB — COMPREHENSIVE METABOLIC PANEL
ALBUMIN: 4 g/dL (ref 3.5–5.0)
ALT: 18 U/L (ref 14–54)
AST: 33 U/L (ref 15–41)
Alkaline Phosphatase: 43 U/L (ref 38–126)
Anion gap: 9 (ref 5–15)
BILIRUBIN TOTAL: 0.4 mg/dL (ref 0.3–1.2)
BUN: 16 mg/dL (ref 6–20)
CHLORIDE: 105 mmol/L (ref 101–111)
CO2: 23 mmol/L (ref 22–32)
CREATININE: 1.13 mg/dL — AB (ref 0.44–1.00)
Calcium: 8.8 mg/dL — ABNORMAL LOW (ref 8.9–10.3)
GFR calc Af Amer: 58 mL/min — ABNORMAL LOW (ref >60)
GFR, EST NON AFRICAN AMERICAN: 50 mL/min — AB (ref >60)
GLUCOSE: 128 mg/dL — AB (ref 65–99)
Potassium: 4.4 mmol/L (ref 3.5–5.1)
Sodium: 137 mmol/L (ref 135–145)
TOTAL PROTEIN: 6.8 g/dL (ref 6.5–8.1)

## 2017-02-14 LAB — CBC WITH DIFFERENTIAL/PLATELET
Basophils Absolute: 0.1 K/uL (ref 0–0.1)
Basophils Relative: 4 %
Eosinophils Absolute: 0 K/uL (ref 0–0.7)
Eosinophils Relative: 4 %
HCT: 27.2 % — ABNORMAL LOW (ref 35.0–47.0)
Hemoglobin: 9 g/dL — ABNORMAL LOW (ref 12.0–16.0)
Lymphocytes Relative: 24 %
Lymphs Abs: 0.3 K/uL — ABNORMAL LOW (ref 1.0–3.6)
MCH: 35 pg — ABNORMAL HIGH (ref 26.0–34.0)
MCHC: 33.2 g/dL (ref 32.0–36.0)
MCV: 105.6 fL — ABNORMAL HIGH (ref 80.0–100.0)
Monocytes Absolute: 0.3 K/uL (ref 0.2–0.9)
Monocytes Relative: 19 %
Neutro Abs: 0.7 K/uL — ABNORMAL LOW (ref 1.4–6.5)
Neutrophils Relative %: 49 %
Platelets: 199 K/uL (ref 150–440)
RBC: 2.57 MIL/uL — ABNORMAL LOW (ref 3.80–5.20)
RDW: 17.2 % — ABNORMAL HIGH (ref 11.5–14.5)
WBC: 1.3 K/uL — CL (ref 3.6–11.0)

## 2017-02-15 ENCOUNTER — Encounter: Payer: Self-pay | Admitting: Oncology

## 2017-02-20 ENCOUNTER — Other Ambulatory Visit: Payer: Self-pay | Admitting: Oncology

## 2017-02-21 ENCOUNTER — Inpatient Hospital Stay: Payer: Medicare Other | Attending: Oncology

## 2017-02-21 DIAGNOSIS — C7951 Secondary malignant neoplasm of bone: Secondary | ICD-10-CM | POA: Diagnosis not present

## 2017-02-21 DIAGNOSIS — M8580 Other specified disorders of bone density and structure, unspecified site: Secondary | ICD-10-CM | POA: Insufficient documentation

## 2017-02-21 DIAGNOSIS — I82C12 Acute embolism and thrombosis of left internal jugular vein: Secondary | ICD-10-CM | POA: Insufficient documentation

## 2017-02-21 DIAGNOSIS — D61818 Other pancytopenia: Secondary | ICD-10-CM | POA: Insufficient documentation

## 2017-02-21 DIAGNOSIS — Z170 Estrogen receptor positive status [ER+]: Secondary | ICD-10-CM | POA: Diagnosis not present

## 2017-02-21 DIAGNOSIS — G620 Drug-induced polyneuropathy: Secondary | ICD-10-CM | POA: Diagnosis not present

## 2017-02-21 DIAGNOSIS — C50912 Malignant neoplasm of unspecified site of left female breast: Secondary | ICD-10-CM

## 2017-02-21 LAB — CBC WITH DIFFERENTIAL/PLATELET
Basophils Absolute: 0 10*3/uL (ref 0–0.1)
Basophils Relative: 3 %
Eosinophils Absolute: 0.1 10*3/uL (ref 0–0.7)
Eosinophils Relative: 6 %
HCT: 27 % — ABNORMAL LOW (ref 35.0–47.0)
HEMOGLOBIN: 9 g/dL — AB (ref 12.0–16.0)
LYMPHS ABS: 0.3 10*3/uL — AB (ref 1.0–3.6)
LYMPHS PCT: 23 %
MCH: 35 pg — AB (ref 26.0–34.0)
MCHC: 33.3 g/dL (ref 32.0–36.0)
MCV: 105.1 fL — AB (ref 80.0–100.0)
Monocytes Absolute: 0.4 10*3/uL (ref 0.2–0.9)
Monocytes Relative: 26 %
NEUTROS ABS: 0.6 10*3/uL — AB (ref 1.4–6.5)
NEUTROS PCT: 42 %
Platelets: 230 10*3/uL (ref 150–440)
RBC: 2.57 MIL/uL — AB (ref 3.80–5.20)
RDW: 17.5 % — ABNORMAL HIGH (ref 11.5–14.5)
WBC: 1.5 10*3/uL — AB (ref 3.6–11.0)

## 2017-02-21 LAB — COMPREHENSIVE METABOLIC PANEL
ALT: 16 U/L (ref 14–54)
ANION GAP: 12 (ref 5–15)
AST: 32 U/L (ref 15–41)
Albumin: 3.9 g/dL (ref 3.5–5.0)
Alkaline Phosphatase: 41 U/L (ref 38–126)
BUN: 18 mg/dL (ref 6–20)
CHLORIDE: 104 mmol/L (ref 101–111)
CO2: 22 mmol/L (ref 22–32)
Calcium: 9 mg/dL (ref 8.9–10.3)
Creatinine, Ser: 0.96 mg/dL (ref 0.44–1.00)
GFR calc non Af Amer: >60 mL/min (ref >60)
Glucose, Bld: 173 mg/dL — ABNORMAL HIGH (ref 65–99)
POTASSIUM: 4.4 mmol/L (ref 3.5–5.1)
Sodium: 138 mmol/L (ref 135–145)
Total Bilirubin: 0.3 mg/dL (ref 0.3–1.2)
Total Protein: 6.5 g/dL (ref 6.5–8.1)

## 2017-02-22 ENCOUNTER — Ambulatory Visit
Admission: RE | Admit: 2017-02-22 | Discharge: 2017-02-22 | Disposition: A | Discharge status: Home / Self Care | Payer: Medicare Other | Source: Ambulatory Visit | Attending: Nurse Practitioner | Admitting: Nurse Practitioner

## 2017-02-22 DIAGNOSIS — I8290 Acute embolism and thrombosis of unspecified vein: Secondary | ICD-10-CM

## 2017-02-22 DIAGNOSIS — K449 Diaphragmatic hernia without obstruction or gangrene: Secondary | ICD-10-CM | POA: Diagnosis not present

## 2017-02-22 DIAGNOSIS — C50919 Malignant neoplasm of unspecified site of unspecified female breast: Secondary | ICD-10-CM

## 2017-02-22 DIAGNOSIS — R918 Other nonspecific abnormal finding of lung field: Secondary | ICD-10-CM | POA: Insufficient documentation

## 2017-02-22 DIAGNOSIS — Z170 Estrogen receptor positive status [ER+]: Principal | ICD-10-CM

## 2017-02-22 DIAGNOSIS — Z9889 Other specified postprocedural states: Secondary | ICD-10-CM | POA: Diagnosis not present

## 2017-02-22 DIAGNOSIS — I82C11 Acute embolism and thrombosis of right internal jugular vein: Secondary | ICD-10-CM | POA: Insufficient documentation

## 2017-02-22 DIAGNOSIS — C7951 Secondary malignant neoplasm of bone: Secondary | ICD-10-CM | POA: Insufficient documentation

## 2017-02-22 DIAGNOSIS — C50912 Malignant neoplasm of unspecified site of left female breast: Secondary | ICD-10-CM

## 2017-02-22 DIAGNOSIS — Z9012 Acquired absence of left breast and nipple: Secondary | ICD-10-CM | POA: Diagnosis not present

## 2017-02-22 MED ORDER — IOPAMIDOL (ISOVUE-300) INJECTION 61%
100.0000 mL | Freq: Once | INTRAVENOUS | Status: AC | PRN
  Administered 2017-02-22: 100 mL via INTRAVENOUS

## 2017-02-23 NOTE — Progress Notes (Signed)
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 Now with biopsy-proven stage IV disease.  INTERVAL HISTORY: Patient returns to clinic today for further evaluation and continuation of Zometa and Ibrance. She continues to have neuropathy in her fingertips and feet which is unchanged. She otherwise feels well and is asymptomatic. She has no other neurologic complaints. She denies any pain. She denies any recent fevers or illnesses.  She denies any chest pain, cough, hemoptysis, or shortness of breath. She denies any nausea, vomiting, constipation, or diarrhea.  She has no urinary complaints.  Patient offers no further specific complaints today.  REVIEW OF SYSTEMS:   Review of Systems  Constitutional: Positive for malaise/fatigue. Negative for fever and weight loss.  Eyes: Negative.  Negative for blurred vision.  Respiratory: Negative.  Negative for cough and shortness of breath.   Cardiovascular: Negative.  Negative for chest pain and leg swelling.  Gastrointestinal: Negative.  Negative for abdominal pain.  Genitourinary: Negative.   Musculoskeletal: Negative for back pain.  Skin: Negative.  Negative for rash.  Neurological: Positive for sensory change and weakness.  Endo/Heme/Allergies: Negative.   Psychiatric/Behavioral: Negative.  Negative for memory loss. The patient is not nervous/anxious.     As per HPI. Otherwise, a complete review of systems is negative.  PAST MEDICAL HISTORY: Past Medical History:  Diagnosis Date  . Back pain    occasionally  . Breast cancer ([place]) 2009   left  . Depression    takes Paxil and Wellbutrin daily    . Diabetes ([place])    takes Metformin daily  . Genetic testing 02/03/2017   Multi-Cancer panel (83 genes) @ [facility] - No pathogenic mutations detected  . GERD (gastroesophageal reflux disease)   . History of bronchitis 2 yrs ago  . Hyperlipidemia    takes Atorvastatin daily  . Hypertension    takes Losartan-HCTZ daily  . Insomnia    takes Ambien nightly  . Insomnia    takes gabapentin nightly  . Joint pain   . Migraine   . Mood swings   . Osteoarthritis of knee   . Personal history of chemotherapy 12/05/2016   Mets from Breast Cancer  . Personal history of radiation therapy 11/2016  . PONV (postoperative nausea and vomiting)   . Seasonal allergies    takes Allegra daily    PAST SURGICAL HISTORY: Past Surgical History:  Procedure Laterality Date  . BREAST BIOPSY  2009  . cataract surgery Bilateral   . COLONOSCOPY    . JOINT REPLACEMENT Left 2014   knee  . KNEE ARTHROSCOPY Left    x 5  . MASTECTOMY Left   . port a cath placed    . PORTACATH PLACEMENT N/A 09/17/2015   Procedure: INSERTION PORT-A-CATH;  Surgeon: Jules Husbands, MD;  Location: [facility];  Service: General;  Laterality: N/A;  . TOTAL SHOULDER ARTHROPLASTY Right 06/03/2015   Procedure: TOTAL SHOULDER ARTHROPLASTY;  Surgeon: Tania Ade, MD;  Location: [facility];  Service: Orthopedics;  Laterality: Right;  Right total shoulder arthroplasty  . TOTAL SHOULDER REPLACEMENT Right 06/03/2015  . TUBAL LIGATION      FAMILY HISTORY: Father with non-Hodgkin's lymphoma, 2 paternal aunts with breast cancer.  ADVANCED DIRECTIVES:    HEALTH MAINTENANCE: Social History   Tobacco Use  . Smoking status: Never Smoker  . Smokeless tobacco: Never Used  Substance Use Topics  . Alcohol use: Yes    Alcohol/week: 0.0 oz    Comment: occasionally wine  . Drug use: Yes    Types: Marijuana    Comment: cannabis with no extra     Colonoscopy:  PAP:  Bone density:  Lipid panel:  Allergies  Allergen Reactions  . Ace  Inhibitors Cough  . Latex Itching  . Morphine And Related Itching    Caused her to itch terribly. Would prefer if given to take with a benadryl  . Other Itching    Freeze spray. Patient stated that she may be able to use it now because she doesn't use it as often.  Marland Kitchen Penicillins Rash    Has patient had a PCN reaction causing immediate rash, facial/tongue/throat swelling, SOB or lightheadedness with hypotension: No Has patient had a PCN reaction causing severe rash involving mucus membranes or skin necrosis: No Has patient had a PCN reaction that required hospitalization No Has patient had a PCN reaction occurring within the last 10 years: No If all of the above answers are "NO", then may proceed with Cephalosporin use.    Current Outpatient Medications  Medication Sig Dispense Refill  . aspirin EC 81 MG tablet Take 81 mg by mouth daily.     Marland Kitchen atorvastatin (LIPITOR) 10 MG tablet Take 10 mg by mouth daily at 6 PM.     . b complex vitamins capsule Take 1 capsule by mouth daily.    Marland Kitchen buPROPion (WELLBUTRIN XL) 150 MG 24 hr tablet Take 150 mg by mouth every morning.     . Calcium-Magnesium-Vitamin D (CALCIUM 1200+D3 PO) Take 1 Dose by mouth daily.    Marland Kitchen ELIQUIS 5 MG TABS tablet TAKE 1 TABLET BY MOUTH TWICE A DAY 60 tablet 1  . fexofenadine (ALLEGRA) 180 MG tablet Take 180 mg by mouth at bedtime.     . gabapentin (NEURONTIN) 300 MG capsule Take 300 mg by mouth. 2 pills in am,2 pills at noon and 3 pills at night    . HYDROcodone-acetaminophen (NORCO) 5-325 MG tablet Take 1-2 tablets by mouth every 4 (four) hours as needed for moderate pain. 30 tablet 0  . ibuprofen (ADVIL,MOTRIN) 800 MG tablet Take 1 tablet (800 mg total) every 8 (eight) hours as needed by mouth for mild pain. 30 tablet 0  . letrozole (FEMARA) 2.5 MG tablet Take 1 tablet (2.5 mg total) by mouth daily. 15 tablet 1  . lidocaine-prilocaine (EMLA) cream Apply 1 application topically as needed. Apply to port 1-2 hours prior to  chemotherapy appointment. Cover with plastic wrap. 30 g 0  . losartan-hydrochlorothiazide (HYZAAR) 100-25 MG per tablet Take 1 tablet by mouth every morning.     . metFORMIN (GLUCOPHAGE) 850 MG tablet Take 850 mg by mouth 2 (two) times daily with a meal.     . ondansetron (ZOFRAN ODT) 4 MG disintegrating tablet Take 1 tablet (4 mg total) by mouth every 8 (eight) hours as needed for nausea or vomiting. 20 tablet 0  . PARoxetine (PAXIL) 40 MG tablet Take 40 mg by mouth every morning.     . Zolpidem Tartrate (AMBIEN PO) Take by mouth.    . palbociclib (IBRANCE) 75 MG capsule Take 1 capsule (75 mg total) by mouth daily with breakfast. Take for 21 days, then 14 days off. 21 capsule 5  Current Facility-Administered Medications  Medication Dose Route Frequency Provider Last Rate Last Dose  . guaiFENesin-dextromethorphan (ROBITUSSIN DM) 100-10 MG/5ML syrup 5 mL  5 mL Oral Once Lloyd Huger, MD        OBJECTIVE: Vitals:   03/01/17 1431  BP: 96/61  Pulse: 90  Resp: 20  Temp: (!) 96.2 F (35.7 C)     Body mass index is 35.12 kg/m.    ECOG FS:0 - Asymptomatic  General: Well-developed, well-nourished, no acute distress. Eyes: Pink conjunctiva, anicteric sclera. HEENT: No palpable lymphadenopathy. Breast: Left chest wall without evidence of recurrence.  Right breast and axilla without lumps or masses. Exam deferred today. Lungs: Clear to auscultation bilaterally. Heart: Regular rate and rhythm. No rubs, murmurs, or gallops. Abdomen: Soft, nontender, nondistended. No organomegaly noted, normoactive bowel sounds. Musculoskeletal: No edema, cyanosis, or clubbing. Neuro: Alert, answering all questions appropriately. Cranial nerves grossly intact. Skin: No rashes or petechiae noted. Psych: Normal affect.   LAB RESULTS:  Lab Results  Component Value Date   NA 141 03/01/2017   K 4.5 03/01/2017   CL 108 03/01/2017   CO2 21 (L) 03/01/2017   GLUCOSE 115 (H) 03/01/2017   BUN 23 (H)  03/01/2017   CREATININE 1.23 (H) 03/01/2017   CALCIUM 8.8 (L) 03/01/2017   PROT 6.4 (L) 03/01/2017   ALBUMIN 3.7 03/01/2017   AST 37 03/01/2017   ALT 17 03/01/2017   ALKPHOS 51 03/01/2017   BILITOT 0.3 03/01/2017   GFRNONAA 45 (L) 03/01/2017   GFRAA 52 (L) 03/01/2017    Lab Results  Component Value Date   WBC 2.7 (L) 03/01/2017   NEUTROABS 1.4 03/01/2017   HGB 8.4 (L) 03/01/2017   HCT 25.2 (L) 03/01/2017   MCV 102.0 (H) 03/01/2017   PLT 349 03/01/2017     STUDIES: Ct Chest W Contrast  Result Date: 02/23/2017 CLINICAL DATA:  Recurrent metastatic left breast cancer EXAM: CT CHEST AND ABDOMEN WITH CONTRAST TECHNIQUE: Multidetector CT imaging of the chest and abdomen was performed following the standard protocol during bolus administration of intravenous contrast. CONTRAST:  178m ISOVUE-300 IOPAMIDOL (ISOVUE-300) INJECTION 61% COMPARISON:  12/25/2016 FINDINGS: CT CHEST FINDINGS Cardiovascular: Mild cardiomegaly.  No pericardial effusion. No evidence of thoracic aortic aneurysm. Mild atherosclerotic calcifications of the aortic arch. Right chest port terminates the cavoatrial junction. Coronary atherosclerosis of the LAD. Mediastinum/Nodes: No suspicious mediastinal lymphadenopathy. Visualized thyroid is unremarkable. Lungs/Pleura: Mild left apical pleural-parenchymal scarring. Mild radiation changes in the left upper lobe. Mild ground-glass opacity/mosaic attenuation in the bilateral lower lobes, with relative improvement in the bilateral upper lobes and right middle lobe. No focal consolidation. No suspicious mediastinal lymphadenopathy. No pleural effusion or pneumothorax. Musculoskeletal: Status post left mastectomy. Widespread multifocal sclerotic osseous metastases in the visualized axial and appendicular skeleton, unchanged. CT ABDOMEN FINDINGS Hepatobiliary: Liver is within normal limits. No suspicious/enhancing hepatic lesions. Gallbladder is unremarkable. No intrahepatic or  extrahepatic ductal dilatation. Pancreas: Within normal limits. Spleen: Within normal limits. Adrenals/Urinary Tract: Adrenal glands are within normal limits. Kidneys are within normal limits. Left renal sinus cysts. No hydronephrosis. Stomach/Bowel: Stomach is notable for a small hiatal hernia. Visualized bowel is unremarkable. Vascular/Lymphatic: No evidence of abdominal aortic aneurysm. No suspicious abdominal lymphadenopathy. Other: No abdominal ascites. Musculoskeletal: Widespread multifocal sclerotic osseous metastases in the visualized axial and appendicular skeleton, unchanged. IMPRESSION: Multifocal sclerotic osseous metastases in the visualized axial and appendicular skeleton, grossly unchanged. Mild ground-glass opacity/mosaic attenuation in the bilateral lower lobes, indeterminate, possibly reflecting mild residual infection or drug  toxicity. Associated ground-glass opacity in the upper lobes and right middle lobe has improved. Status post left mastectomy with radiation changes in the left upper lobe. Electronically Signed   By: Julian Hy M.D.   On: 02/23/2017 08:25   Ct Abdomen W Contrast  Result Date: 02/23/2017 CLINICAL DATA:  Recurrent metastatic left breast cancer EXAM: CT CHEST AND ABDOMEN WITH CONTRAST TECHNIQUE: Multidetector CT imaging of the chest and abdomen was performed following the standard protocol during bolus administration of intravenous contrast. CONTRAST:  166m ISOVUE-300 IOPAMIDOL (ISOVUE-300) INJECTION 61% COMPARISON:  12/25/2016 FINDINGS: CT CHEST FINDINGS Cardiovascular: Mild cardiomegaly.  No pericardial effusion. No evidence of thoracic aortic aneurysm. Mild atherosclerotic calcifications of the aortic arch. Right chest port terminates the cavoatrial junction. Coronary atherosclerosis of the LAD. Mediastinum/Nodes: No suspicious mediastinal lymphadenopathy. Visualized thyroid is unremarkable. Lungs/Pleura: Mild left apical pleural-parenchymal scarring. Mild  radiation changes in the left upper lobe. Mild ground-glass opacity/mosaic attenuation in the bilateral lower lobes, with relative improvement in the bilateral upper lobes and right middle lobe. No focal consolidation. No suspicious mediastinal lymphadenopathy. No pleural effusion or pneumothorax. Musculoskeletal: Status post left mastectomy. Widespread multifocal sclerotic osseous metastases in the visualized axial and appendicular skeleton, unchanged. CT ABDOMEN FINDINGS Hepatobiliary: Liver is within normal limits. No suspicious/enhancing hepatic lesions. Gallbladder is unremarkable. No intrahepatic or extrahepatic ductal dilatation. Pancreas: Within normal limits. Spleen: Within normal limits. Adrenals/Urinary Tract: Adrenal glands are within normal limits. Kidneys are within normal limits. Left renal sinus cysts. No hydronephrosis. Stomach/Bowel: Stomach is notable for a small hiatal hernia. Visualized bowel is unremarkable. Vascular/Lymphatic: No evidence of abdominal aortic aneurysm. No suspicious abdominal lymphadenopathy. Other: No abdominal ascites. Musculoskeletal: Widespread multifocal sclerotic osseous metastases in the visualized axial and appendicular skeleton, unchanged. IMPRESSION: Multifocal sclerotic osseous metastases in the visualized axial and appendicular skeleton, grossly unchanged. Mild ground-glass opacity/mosaic attenuation in the bilateral lower lobes, indeterminate, possibly reflecting mild residual infection or drug toxicity. Associated ground-glass opacity in the upper lobes and right middle lobe has improved. Status post left mastectomy with radiation changes in the left upper lobe. Electronically Signed   By: SJulian HyM.D.   On: 02/23/2017 08:25   UKoreaVenous Img Upper Uni Right  Result Date: 02/22/2017 CLINICAL DATA:  History of breast cancer and pulmonary embolism (currently on anticoagulation), now with nonocclusive thrombus involving the right internal jugular vein.  History of Port a catheter placement. EXAM: RIGHT UPPER EXTREMITY VENOUS DOPPLER ULTRASOUND TECHNIQUE: Gray-scale sonography with graded compression, as well as color Doppler and duplex ultrasound were performed to evaluate the upper extremity deep venous system from the level of the subclavian vein and including the jugular, axillary, basilic, radial, ulnar and upper cephalic vein. Spectral Doppler was utilized to evaluate flow at rest and with distal augmentation maneuvers. COMPARISON:  Chest CT-12/25/2016 FINDINGS: Contralateral Subclavian Vein: Respiratory phasicity is normal and symmetric with the symptomatic side. No evidence of thrombus. Normal compressibility. Internal Jugular Vein: There is a minimal amount of nonocclusive thrombus surrounding the Port a catheter tubing within the right internal jugular vein (image 11, 12 and 13). Subclavian Vein: No evidence of thrombus. Normal compressibility, respiratory phasicity and response to augmentation. Axillary Vein: No evidence of thrombus. Normal compressibility, respiratory phasicity and response to augmentation. Cephalic Vein: No evidence of thrombus. Normal compressibility, respiratory phasicity and response to augmentation. Basilic Vein: No evidence of thrombus. Normal compressibility, respiratory phasicity and response to augmentation. Brachial Veins: No evidence of thrombus. Normal compressibility, respiratory phasicity and response to augmentation. Radial Veins:  No evidence of thrombus. Normal compressibility, respiratory phasicity and response to augmentation. Ulnar Veins: No evidence of thrombus. Normal compressibility, respiratory phasicity and response to augmentation. Venous Reflux:  None visualized. Other Findings:  None visualized. IMPRESSION: 1. Minimal amount of nonocclusive thrombus surrounding the Port a catheter tubing within the right internal jugular vein. 2. Otherwise, no evidence of DVT within the right upper extremity. Electronically  Signed   By: Sandi Mariscal M.D.   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 Now with biopsy proven stage IV disease with lymph node and bone metastasis.  PLAN:    1.  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  Will continue with dose reduced Ibrance 75 mg for 21 days, but patient will have 14 days off given her persistent pancytopenia. She also will take daily letrozole as well as monthly Zometa.  Return to clinic in 5 weeks with repeat laboratory work, further evaluation, and consideration of Zometa.   2.  Osteopenia: Patient's bone mineral density on January 26, 2016 reported a T score of -0.9 which is considered normal. Continue calcium and vitamin D. Patient is now taking Zometa as above. 3. Pancytopenia: Improved.  Dose reduce Ibrance as above and 14 days between each cycle.  4. Peripheral neuropathy: Continue gabapentin as prescribed. Neuropathy managed by neurology.  5. Back pain: Resolved.  6.  Left IJ clot: Patient still has residual thrombus surrounding her port, therefore is recommended to continue Eliquis for 5 more weeks and will repeat ultrasound.  Patient expressed understanding and was in agreement with this plan. She also understands that She can call clinic at any time with any questions, concerns, or complaints.   Breast cancer   Staging form: Breast, AJCC 7th Edition     Pathologic stage from 08/11/2014: Stage IIIA (T0, N2a, cM0) - Signed by Lloyd Huger, MD on 08/11/2014   Lloyd Huger, MD   03/04/2017 7:47 AM

## 2017-03-01 ENCOUNTER — Inpatient Hospital Stay: Payer: Medicare Other

## 2017-03-01 ENCOUNTER — Other Ambulatory Visit: Payer: Self-pay | Admitting: Pharmacist

## 2017-03-01 ENCOUNTER — Inpatient Hospital Stay (HOSPITAL_BASED_OUTPATIENT_CLINIC_OR_DEPARTMENT_OTHER): Payer: Medicare Other | Admitting: Oncology

## 2017-03-01 ENCOUNTER — Other Ambulatory Visit: Payer: Self-pay | Admitting: Oncology

## 2017-03-01 VITALS — BP 96/61 | HR 90 | Temp 96.2°F | Resp 20 | Wt 204.6 lb

## 2017-03-01 VITALS — BP 109/76 | HR 100 | Resp 18

## 2017-03-01 DIAGNOSIS — C50919 Malignant neoplasm of unspecified site of unspecified female breast: Secondary | ICD-10-CM

## 2017-03-01 DIAGNOSIS — E538 Deficiency of other specified B group vitamins: Secondary | ICD-10-CM

## 2017-03-01 DIAGNOSIS — I82C12 Acute embolism and thrombosis of left internal jugular vein: Secondary | ICD-10-CM

## 2017-03-01 DIAGNOSIS — G620 Drug-induced polyneuropathy: Secondary | ICD-10-CM | POA: Diagnosis not present

## 2017-03-01 DIAGNOSIS — Z170 Estrogen receptor positive status [ER+]: Principal | ICD-10-CM

## 2017-03-01 DIAGNOSIS — M8580 Other specified disorders of bone density and structure, unspecified site: Secondary | ICD-10-CM | POA: Diagnosis not present

## 2017-03-01 DIAGNOSIS — D61818 Other pancytopenia: Secondary | ICD-10-CM | POA: Diagnosis not present

## 2017-03-01 DIAGNOSIS — C50912 Malignant neoplasm of unspecified site of left female breast: Secondary | ICD-10-CM

## 2017-03-01 DIAGNOSIS — I8290 Acute embolism and thrombosis of unspecified vein: Secondary | ICD-10-CM

## 2017-03-01 DIAGNOSIS — C7951 Secondary malignant neoplasm of bone: Secondary | ICD-10-CM | POA: Diagnosis not present

## 2017-03-01 DIAGNOSIS — C779 Secondary and unspecified malignant neoplasm of lymph node, unspecified: Secondary | ICD-10-CM | POA: Diagnosis not present

## 2017-03-01 LAB — COMPREHENSIVE METABOLIC PANEL
ALK PHOS: 51 U/L (ref 38–126)
ALT: 17 U/L (ref 14–54)
ANION GAP: 12 (ref 5–15)
AST: 37 U/L (ref 15–41)
Albumin: 3.7 g/dL (ref 3.5–5.0)
BILIRUBIN TOTAL: 0.3 mg/dL (ref 0.3–1.2)
BUN: 23 mg/dL — ABNORMAL HIGH (ref 6–20)
CALCIUM: 8.8 mg/dL — AB (ref 8.9–10.3)
CO2: 21 mmol/L — AB (ref 22–32)
CREATININE: 1.23 mg/dL — AB (ref 0.44–1.00)
Chloride: 108 mmol/L (ref 101–111)
GFR calc non Af Amer: 45 mL/min — ABNORMAL LOW (ref >60)
GFR, EST AFRICAN AMERICAN: 52 mL/min — AB (ref >60)
GLUCOSE: 115 mg/dL — AB (ref 65–99)
Potassium: 4.5 mmol/L (ref 3.5–5.1)
SODIUM: 141 mmol/L (ref 135–145)
TOTAL PROTEIN: 6.4 g/dL — AB (ref 6.5–8.1)

## 2017-03-01 LAB — VITAMIN B12: Vitamin B-12: 197 pg/mL (ref 180–914)

## 2017-03-01 LAB — CBC WITH DIFFERENTIAL/PLATELET
BASOS ABS: 0.1 10*3/uL (ref 0–0.1)
BASOS PCT: 4 %
Eosinophils Absolute: 0.2 10*3/uL (ref 0–0.7)
Eosinophils Relative: 8 %
HEMATOCRIT: 25.2 % — AB (ref 35.0–47.0)
HEMOGLOBIN: 8.4 g/dL — AB (ref 12.0–16.0)
LYMPHS PCT: 19 %
Lymphs Abs: 0.5 10*3/uL — ABNORMAL LOW (ref 1.0–3.6)
MCH: 34 pg (ref 26.0–34.0)
MCHC: 33.4 g/dL (ref 32.0–36.0)
MCV: 102 fL — ABNORMAL HIGH (ref 80.0–100.0)
Monocytes Absolute: 0.5 10*3/uL (ref 0.2–0.9)
Monocytes Relative: 20 %
NEUTROS ABS: 1.4 10*3/uL (ref 1.4–6.5)
NEUTROS PCT: 49 %
Platelets: 349 10*3/uL (ref 150–440)
RBC: 2.47 MIL/uL — AB (ref 3.80–5.20)
RDW: 17.1 % — AB (ref 11.5–14.5)
WBC: 2.7 10*3/uL — ABNORMAL LOW (ref 3.6–11.0)

## 2017-03-01 MED ORDER — PALBOCICLIB 75 MG PO CAPS: 75.0000 mg | ORAL_CAPSULE | Freq: Every day | ORAL | 5 refills | Status: DC

## 2017-03-01 MED ORDER — HEPARIN SOD (PORK) LOCK FLUSH 100 UNIT/ML IV SOLN
500.0000 [IU] | Freq: Once | INTRAVENOUS | Status: AC
  Administered 2017-03-01: 500 [IU] via INTRAVENOUS

## 2017-03-01 MED ORDER — SODIUM CHLORIDE 0.9 % IV SOLN
Freq: Once | INTRAVENOUS | Status: AC
  Administered 2017-03-01: 15:00:00 via INTRAVENOUS
  Filled 2017-03-01: qty 1000

## 2017-03-01 MED ORDER — ZOLEDRONIC ACID 4 MG/100ML IV SOLN
4.0000 mg | Freq: Once | INTRAVENOUS | Status: AC
  Administered 2017-03-01: 4 mg via INTRAVENOUS
  Filled 2017-03-01: qty 100

## 2017-03-01 NOTE — Addendum Note (Signed)
Addended by: Daleen Bo ANN T on: 03/01/2017 03:27 PM   Modules accepted: Orders

## 2017-03-01 NOTE — Progress Notes (Signed)
Patient denies any concerns today.  

## 2017-03-19 ENCOUNTER — Ambulatory Visit: Payer: Medicare Other | Attending: Oncology | Admitting: Occupational Therapy

## 2017-03-19 DIAGNOSIS — G620 Drug-induced polyneuropathy: Secondary | ICD-10-CM | POA: Diagnosis not present

## 2017-03-19 DIAGNOSIS — I972 Postmastectomy lymphedema syndrome: Secondary | ICD-10-CM

## 2017-03-19 DIAGNOSIS — T451X5A Adverse effect of antineoplastic and immunosuppressive drugs, initial encounter: Secondary | ICD-10-CM | POA: Diagnosis not present

## 2017-03-19 NOTE — Therapy (Signed)
[facility] [address], [place], [address] Phone: [phone]   Fax:  [phone]  Occupational Therapy Treatment  Patient Details  Name: Amanda [name] MRN: [id number] Date of Birth: Jan 02, 1951 Referring Provider: Lloyd Huger   Encounter Date: 03/19/2017  OT End of Session - 03/19/17 1346    Visit Number  9    Number of Visits  11    Date for OT Re-Evaluation  05/14/17    OT Start Time  1220    OT Stop Time  1246    OT Time Calculation (min)  26 min    Activity Tolerance  Patient tolerated treatment well    Behavior During Therapy  [facility] for tasks assessed/performed       Past Medical History:  Diagnosis Date  . Back pain    occasionally  . Breast cancer ([place]) 2009   left  . Depression    takes Paxil and Wellbutrin daily  . Diabetes ([place])    takes Metformin daily  . Genetic testing 02/03/2017   Multi-Cancer panel (83 genes) @ [facility] - No pathogenic mutations detected  . GERD (gastroesophageal reflux disease)   . History of bronchitis 2 yrs ago  . Hyperlipidemia    takes Atorvastatin daily  . Hypertension    takes Losartan-HCTZ daily  . Insomnia    takes Ambien nightly  . Insomnia    takes gabapentin nightly  . Joint pain   . Migraine   . Mood swings   . Osteoarthritis of knee   . Personal history of chemotherapy 12/05/2016   Mets from Breast Cancer  . Personal history of radiation therapy 11/2016  . PONV (postoperative nausea and vomiting)   . Seasonal allergies    takes Allegra daily    Past Surgical History:  Procedure Laterality Date  . BREAST BIOPSY  2009  . cataract surgery Bilateral   . COLONOSCOPY    . JOINT REPLACEMENT Left 2014   knee  . KNEE ARTHROSCOPY Left    x 5  . MASTECTOMY Left   . port a cath placed    . PORTACATH PLACEMENT N/A 09/17/2015   Procedure: INSERTION PORT-A-CATH;  Surgeon: Jules Husbands, MD;  Location: [facility];  Service: General;   Laterality: N/A;  . TOTAL SHOULDER ARTHROPLASTY Right 06/03/2015   Procedure: TOTAL SHOULDER ARTHROPLASTY;  Surgeon: Tania Ade, MD;  Location: [place];  Service: Orthopedics;  Laterality: Right;  Right total shoulder arthroplasty  . TOTAL SHOULDER REPLACEMENT Right 06/03/2015  . TUBAL LIGATION      There were no vitals filed for this visit.  Subjective Assessment - 03/19/17 1344    Subjective   Doing okay - I am wearing my sleeve some - but there is this one that is Juzo that has pictures on it - that I wanted to ask you if I can get that - and then I got these new bras - it is compression bra - this is XL and I have these new prosthesis - and jovipak - the prosthesis feels so much better- you built type of the breast you want     Patient Stated Goals  Patient reports she wants to control the swelling in her left arm, be able to take care of herself.           LYMPHEDEMA/ONCOLOGY QUESTIONNAIRE - 03/19/17 1229      Left Upper Extremity Lymphedema   15 cm Proximal to Olecranon Process  42 cm    10 cm Proximal to Olecranon Process  40 cm    Olecranon Process  27.6 cm    15 cm Proximal to Ulnar Styloid Process  27.2 cm    10 cm Proximal to Ulnar Styloid Process  24.4 cm    Just Proximal to Ulnar Styloid Process  16.5 cm    Across Hand at [employer]  19.4 cm    At [place] of 2nd Digit  6.7 cm    At [facility] of Thumb  7 cm       Pt arrive with compression bra - XL with wide strap - do roll as waist - but good compression and fit   able to get jovipak in and was fitted with this and new prosthesis - that she can built herself using 3 pieces - feels like she will be compliant with this  Remind her she is due for new daytime compression sleeve - and gauntlet - she had question on getting Juzo - with pictures  remind her it has to have the same containment than her [employer] strong  She do not wear night time garments -  So want to contain her in daytime  Shoulder pain still good   And her circumference decrease compare to last time  See flowsheet  Pt to follow up when she gets her new sleeve and gauntlet                OT Education - 03/19/17 1345    Education provided  Yes    Education Details  wearing of compression bra and jovipak - and to get new sleeve but need to be simlar containment than the one she has now     [employer]) Educated  Patient    Methods  Explanation    Comprehension  Verbalized understanding          OT Long Term Goals - 03/19/17 1349      OT LONG TERM GOAL #1   Title  Patient will be independent in wear, care and schedule of compression garments.     Baseline  just found last week prosthesis and compression bra with jovipak she likes and will wear but need new compression daysleeve     Time  8    Period  Weeks    Status  On-going    Target Date  05/14/17      OT LONG TERM GOAL #2   Title  Patient will demonstrate HEP with modified independence.     Status  Achieved      OT LONG TERM GOAL #3   Title  Patient will decrease circumferential measurements by 1.5 cm from mid forearm to upper arm to fit into compression garment and for maintenace of lymphedema symptoms.     Status  Achieved            Plan - 03/19/17 1347    Clinical Impression Statement  Pt doing well with circumference of L UE - has new compression bras last week and prosthesis that she can built herself - and jovipak to wear with prosthesis during day - she is contianing her L UE arm with the garments she is wearing - pt wants to get Juzo sleeve - pt just need to make sure the fabric is the correct  containment     Occupational performance deficits (Please refer to evaluation for details):  ADL's;IADL's;Rest and Sleep    Rehab Potential  Good  Current Impairments/barriers affecting progress:  metastatic disease.  Husband travels for work for extended periods of time in which patient is alone.     OT Frequency  Monthly    OT Duration  8 weeks     OT Treatment/Interventions  Self-care/ADL training;Patient/family education;Manual Therapy;Manual lymph drainage;Therapeutic exercise    Clinical Decision Making  Limited treatment options, no task modification necessary    OT Home Exercise Plan  see pt instructions     Consulted and Agree with Plan of Care  Patient       Patient will benefit from skilled therapeutic intervention in order to improve the following deficits and impairments:  Decreased range of motion, Increased edema, Decreased scar mobility, Pain, Impaired UE functional use, Decreased strength  Visit Diagnosis: Postmastectomy lymphedema    Problem List Patient Active Problem List   Diagnosis Date Noted  . Genetic testing 02/03/2017  . Contusion of knee 12/06/2016  . Dislocation of shoulder joint 12/06/2016  . Shoulder joint pain 12/06/2016  . Localized, primary osteoarthritis 12/06/2016  . Osteoarthritis of knee 12/06/2016  . Burning sensation 11/13/2016  . Numbness and tingling 11/13/2016  . Malignant neoplasm of left breast in female, estrogen receptor positive ([place]) 10/05/2016  . Carcinoma of left breast, stage 4 ([place]) 04/06/2016  . Chemotherapy-induced neuropathy ([place]) 03/07/2016  . Metastatic breast cancer ([place]) 09/24/2015  . 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 Personal history of breast cancer 08/23/2015  . 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 Degenerative arthritis of right shoulder region 01/13/2015  . Cancer of left female breast  ([place]) 07/27/2014  . 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 Rotator cuff tear 06/10/2014  . Allergic rhinitis, seasonal 09/16/2013  . Depression 08/10/2013  . Diabetes mellitus type 2, uncomplicated ([place]) 53/74/8270  . Hyperlipidemia, unspecified 08/10/2013  . BP (high blood pressure) 08/10/2013  . Osteoporosis, post-menopausal 08/10/2013    Rosalyn Gess OTR/L,CLT 03/19/2017, 1:51 PM  [facility] [address] [place], [address] Phone: [phone]   Fax:  [phone]  Name: CHRISTY EHRSAM MRN: [id number] Date of Birth: [birth year]

## 2017-03-22 ENCOUNTER — Inpatient Hospital Stay: Payer: Medicare Other | Attending: Oncology

## 2017-03-22 DIAGNOSIS — C50912 Malignant neoplasm of unspecified site of left female breast: Secondary | ICD-10-CM

## 2017-03-22 DIAGNOSIS — Z170 Estrogen receptor positive status [ER+]: Secondary | ICD-10-CM

## 2017-03-22 DIAGNOSIS — D649 Anemia, unspecified: Secondary | ICD-10-CM | POA: Insufficient documentation

## 2017-03-22 LAB — CBC WITH DIFFERENTIAL/PLATELET
BASOS PCT: 4 %
Basophils Absolute: 0.1 10*3/uL (ref 0–0.1)
EOS ABS: 0.1 10*3/uL (ref 0–0.7)
Eosinophils Relative: 6 %
HCT: 20.7 % — ABNORMAL LOW (ref 35.0–47.0)
Hemoglobin: 6.7 g/dL — ABNORMAL LOW (ref 12.0–16.0)
Lymphocytes Relative: 25 %
Lymphs Abs: 0.4 10*3/uL — ABNORMAL LOW (ref 1.0–3.6)
MCH: 32.5 pg (ref 26.0–34.0)
MCHC: 32.3 g/dL (ref 32.0–36.0)
MCV: 100.7 fL — ABNORMAL HIGH (ref 80.0–100.0)
MONO ABS: 0.2 10*3/uL (ref 0.2–0.9)
MONOS PCT: 15 %
Neutro Abs: 0.7 10*3/uL — ABNORMAL LOW (ref 1.4–6.5)
Neutrophils Relative %: 50 %
Platelets: 191 10*3/uL (ref 150–440)
RBC: 2.05 MIL/uL — ABNORMAL LOW (ref 3.80–5.20)
RDW: 20.1 % — AB (ref 11.5–14.5)
WBC: 1.5 10*3/uL — ABNORMAL LOW (ref 3.6–11.0)

## 2017-03-22 LAB — COMPREHENSIVE METABOLIC PANEL
ALBUMIN: 3.9 g/dL (ref 3.5–5.0)
ALK PHOS: 43 U/L (ref 38–126)
ALT: 17 U/L (ref 14–54)
AST: 33 U/L (ref 15–41)
Anion gap: 8 (ref 5–15)
BUN: 17 mg/dL (ref 6–20)
CALCIUM: 9.1 mg/dL (ref 8.9–10.3)
CO2: 22 mmol/L (ref 22–32)
CREATININE: 1.08 mg/dL — AB (ref 0.44–1.00)
Chloride: 108 mmol/L (ref 101–111)
GFR calc non Af Amer: 53 mL/min — ABNORMAL LOW (ref >60)
GLUCOSE: 123 mg/dL — AB (ref 65–99)
Potassium: 4.5 mmol/L (ref 3.5–5.1)
SODIUM: 138 mmol/L (ref 135–145)
Total Bilirubin: 0.2 mg/dL — ABNORMAL LOW (ref 0.3–1.2)
Total Protein: 6.6 g/dL (ref 6.5–8.1)

## 2017-03-28 DIAGNOSIS — R05 Cough: Secondary | ICD-10-CM | POA: Diagnosis not present

## 2017-03-28 DIAGNOSIS — F5104 Psychophysiologic insomnia: Secondary | ICD-10-CM | POA: Diagnosis not present

## 2017-03-28 DIAGNOSIS — R0602 Shortness of breath: Secondary | ICD-10-CM | POA: Diagnosis not present

## 2017-03-29 ENCOUNTER — Ambulatory Visit
Admission: RE | Admit: 2017-03-29 | Discharge: 2017-03-29 | Disposition: A | Discharge status: Home / Self Care | Payer: Medicare Other | Source: Ambulatory Visit | Attending: Oncology | Admitting: Oncology

## 2017-03-29 DIAGNOSIS — I8290 Acute embolism and thrombosis of unspecified vein: Secondary | ICD-10-CM | POA: Diagnosis not present

## 2017-03-29 DIAGNOSIS — T8249XA Other complication of vascular dialysis catheter, initial encounter: Secondary | ICD-10-CM | POA: Diagnosis not present

## 2017-03-29 DIAGNOSIS — Z95828 Presence of other vascular implants and grafts: Secondary | ICD-10-CM | POA: Diagnosis not present

## 2017-04-01 NOTE — Progress Notes (Signed)
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 Now with biopsy-proven stage IV disease.  INTERVAL HISTORY: Patient returns to clinic today for further evaluation and continuation of Zometa.  She recently had a fall with significant facial injury, but denies loss of consciousness.  She did not have any head injury and CT scan was reported as normal.  She denies any fractured bones. She continues to have neuropathy in her fingertips and feet which is unchanged. She has persistent nausea secondary to doxycycline which she was given for possible pneumonia. She has no other neurologic complaints. She denies any pain. She denies any recent fevers or illnesses.  She denies any chest pain, cough, hemoptysis, or shortness of breath. She denies any vomiting, constipation, or diarrhea.  She has no urinary complaints.  Patient offers no further specific complaints today.  REVIEW OF SYSTEMS:   Review of Systems  Constitutional: Positive for malaise/fatigue. Negative for fever and weight loss.  Eyes: Positive for pain. Negative for blurred vision and double vision.  Respiratory: Negative.  Negative for cough and shortness of breath.   Cardiovascular: Negative.  Negative for chest pain and leg swelling.  Gastrointestinal: Negative.  Negative for abdominal pain.  Genitourinary: Negative.   Musculoskeletal: Positive for falls. Negative for back pain.  Skin: Negative.  Negative for rash.  Neurological: Positive for sensory change and weakness. Negative for focal weakness.  Endo/Heme/Allergies: Negative.   Psychiatric/Behavioral: Negative.  Negative for memory  loss. The patient is not nervous/anxious.     As per HPI. Otherwise, a complete review of systems is negative.  PAST MEDICAL HISTORY: Past Medical History:  Diagnosis Date  . Back pain    occasionally  . Breast cancer ([place]) 2009   left  . Depression    takes Paxil and Wellbutrin daily  . Diabetes ([place])    takes Metformin daily  . Genetic testing 02/03/2017   Multi-Cancer panel (83 genes) @ [facility] - No pathogenic mutations detected  . GERD (gastroesophageal reflux disease)   . History of bronchitis 2 yrs ago  . Hyperlipidemia    takes Atorvastatin daily  . Hypertension    takes Losartan-HCTZ daily  . Insomnia    takes Ambien nightly  . Insomnia    takes gabapentin nightly  . Joint pain   . Migraine   . Mood swings   . Osteoarthritis of knee   . Personal history of chemotherapy 12/05/2016   Mets from Breast Cancer  . Personal history of radiation therapy 11/2016  . PONV (postoperative nausea and vomiting)   . Seasonal allergies    takes Allegra daily    PAST SURGICAL HISTORY: Past Surgical History:  Procedure Laterality Date  . BREAST BIOPSY  2009  . cataract surgery Bilateral   . COLONOSCOPY    . JOINT REPLACEMENT Left 2014   knee  . KNEE ARTHROSCOPY Left    x 5  . MASTECTOMY Left   . port a cath placed    . PORTACATH PLACEMENT N/A 09/17/2015   Procedure: INSERTION PORT-A-CATH;  Surgeon: Jules Husbands, MD;  Location: [facility];  Service: General;  Laterality: N/A;  . TOTAL SHOULDER ARTHROPLASTY Right 06/03/2015   Procedure: TOTAL SHOULDER ARTHROPLASTY;  Surgeon: Tania Ade, MD;  Location: [place];  Service: Orthopedics;  Laterality: Right;  Right total shoulder arthroplasty  . TOTAL SHOULDER REPLACEMENT Right 06/03/2015  . TUBAL LIGATION      FAMILY HISTORY: Father with non-Hodgkin's lymphoma, 2 paternal aunts with breast cancer.     ADVANCED DIRECTIVES:    HEALTH MAINTENANCE: Social History   Tobacco Use  . Smoking status: Never Smoker  .  Smokeless tobacco: Never Used  Substance Use Topics  . Alcohol use: Yes    Alcohol/week: 0.0 oz    Comment: occasionally wine  . Drug use: Yes    Types: Marijuana    Comment: cannabis with no extra     Colonoscopy:  PAP:  Bone density:  Lipid panel:  Allergies  Allergen Reactions  . Ace Inhibitors Cough  . Latex Itching  . Morphine And Related Itching    Caused her to itch terribly. Would prefer if given to take with a benadryl  . Other Itching    Freeze spray. Patient stated that she may be able to use it now because she doesn't use it as often.  Marland Kitchen Penicillins Rash    Has patient had a PCN reaction causing immediate rash, facial/tongue/throat swelling, SOB or lightheadedness with hypotension: No Has patient had a PCN reaction causing severe rash involving mucus membranes or skin necrosis: No Has patient had a PCN reaction that required hospitalization No Has patient had a PCN reaction occurring within the last 10 years: No If all of the above answers are "NO", then may proceed with Cephalosporin use.    Current Outpatient Medications  Medication Sig Dispense Refill  . aspirin EC 81 MG tablet Take 81 mg by mouth daily.     Marland Kitchen atorvastatin (LIPITOR) 10 MG tablet Take 10 mg by mouth daily at 6 PM.     . b complex vitamins capsule Take 1 capsule by mouth daily.    Marland Kitchen buPROPion (WELLBUTRIN XL) 150 MG 24 hr tablet Take 150 mg by mouth every morning.     . Calcium-Magnesium-Vitamin D (CALCIUM 1200+D3 PO) Take 1 Dose by mouth daily.    Marland Kitchen ELIQUIS 5 MG TABS tablet TAKE 1 TABLET BY MOUTH TWICE A DAY 60 tablet 1  . fexofenadine (ALLEGRA) 180 MG tablet Take 180 mg by mouth at bedtime.     . gabapentin (NEURONTIN) 300 MG capsule Take 300 mg by mouth. 2 pills in am,2 pills at noon and 3 pills at night    . HYDROcodone-acetaminophen (NORCO) 5-325 MG tablet Take 1-2 tablets by mouth every 4 (four) hours as needed for moderate pain. 30 tablet 0  . ibuprofen (ADVIL,MOTRIN) 800 MG tablet Take 1  tablet (800 mg total) every 8 (eight) hours as needed by mouth for mild pain. 30 tablet 0  . letrozole (FEMARA) 2.5 MG tablet Take 1 tablet (2.5 mg total) by mouth daily. 15 tablet 1  . lidocaine-prilocaine (EMLA) cream Apply 1 application topically as needed. Apply to port 1-2 hours prior to chemotherapy appointment. Cover with plastic wrap. 30 g 0  . losartan-hydrochlorothiazide (HYZAAR) 100-25 MG per tablet Take 1 tablet by mouth every morning.     . metFORMIN (GLUCOPHAGE) 850 MG tablet Take 850 mg by mouth 2 (two) times daily with a meal.     . ondansetron (ZOFRAN ODT) 4 MG disintegrating tablet Take 1 tablet (4 mg total) by mouth every 8 (eight) hours as needed for nausea or vomiting. 20 tablet 0  . palbociclib (IBRANCE) 75 MG  capsule Take 1 capsule (75 mg total) by mouth daily with breakfast. Take for 21 days, then 14 days off. 21 capsule 5  . PARoxetine (PAXIL) 40 MG tablet Take 40 mg by mouth every morning.     . Zolpidem Tartrate (AMBIEN PO) Take by mouth.     Current Facility-Administered Medications  Medication Dose Route Frequency Provider Last Rate Last Dose  . guaiFENesin-dextromethorphan (ROBITUSSIN DM) 100-10 MG/5ML syrup 5 mL  5 mL Oral Once Lloyd Huger, MD        OBJECTIVE: Vitals:   04/06/17 1238  BP: (!) 152/81  Pulse: 94  Resp: 20  Temp: (!) 96.1 F (35.6 C)     There is no height or weight on file to calculate BMI.    ECOG FS:0 - Asymptomatic  General: Well-developed, well-nourished, no acute distress. Eyes: Bloody conjunctiva, anicteric sclera. HEENT: Significant ecchymoses surrounding both eyes.  No palpable lymphadenopathy. Breast: Left chest wall without evidence of recurrence.  Right breast and axilla without lumps or masses. Exam deferred today. Lungs: Clear to auscultation bilaterally. Heart: Regular rate and rhythm. No rubs, murmurs, or gallops. Abdomen: Soft, nontender, nondistended. No organomegaly noted, normoactive bowel  sounds. Musculoskeletal: No edema, cyanosis, or clubbing. Neuro: Alert, answering all questions appropriately. Cranial nerves grossly intact. Skin: No rashes or petechiae noted. Psych: Normal affect.   LAB RESULTS:  Lab Results  Component Value Date   NA 133 (L) 04/06/2017   K 4.1 04/06/2017   CL 102 04/06/2017   CO2 21 (L) 04/06/2017   GLUCOSE 111 (H) 04/06/2017   BUN 19 04/06/2017   CREATININE 1.02 (H) 04/06/2017   CALCIUM 8.4 (L) 04/06/2017   PROT 6.3 (L) 04/06/2017   ALBUMIN 3.5 04/06/2017   AST 37 04/06/2017   ALT 17 04/06/2017   ALKPHOS 51 04/06/2017   BILITOT 0.5 04/06/2017   GFRNONAA 56 (L) 04/06/2017   GFRAA >60 04/06/2017    Lab Results  Component Value Date   WBC 3.1 (L) 04/06/2017   NEUTROABS 1.8 04/06/2017   HGB 5.3 (L) 04/06/2017   HCT 16.9 (L) 04/06/2017   MCV 93.1 04/06/2017   PLT 390 04/06/2017     STUDIES: US Venous Img Upper Uni Right  Result Date: 03/29/2017 CLINICAL DATA:  Follow-up blood clot around Port a catheter tubing. EXAM: RIGHT UPPER EXTREMITY VENOUS DOPPLER ULTRASOUND TECHNIQUE: Gray-scale sonography with graded compression, as well as color Doppler and duplex ultrasound were performed to evaluate the upper extremity deep venous system from the level of the subclavian vein and including the jugular, axillary, basilic, radial, ulnar and upper cephalic vein. Spectral Doppler was utilized to evaluate flow at rest and with distal augmentation maneuvers. COMPARISON:  Right upper extremity venous Doppler ultrasound - 02/22/2017 FINDINGS: Contralateral Subclavian Vein: Respiratory phasicity is normal and symmetric with the symptomatic side. No evidence of thrombus. Normal compressibility. Internal Jugular Vein: The amount of nonocclusive thrombus surrounding the central aspect of the right internal jugular approach port a catheter tubing appears morphologically similar to the 07/2017 examination. Subclavian Vein: No evidence of thrombus. Normal  compressibility, respiratory phasicity and response to augmentation. Axillary Vein: No evidence of thrombus. Normal compressibility, respiratory phasicity and response to augmentation. Cephalic Vein: No evidence of thrombus. Normal compressibility, respiratory phasicity and response to augmentation. Basilic Vein: No evidence of thrombus. Normal compressibility, respiratory phasicity and response to augmentation. Brachial Veins: No evidence of thrombus. Normal compressibility, respiratory phasicity and response to augmentation. Radial Veins: No evidence of thrombus. Normal compressibility, respiratory phasicity  and response to augmentation. Ulnar Veins: No evidence of thrombus. Normal compressibility, respiratory phasicity and response to augmentation. Venous Reflux:  None visualized. Other Findings:  None visualized. IMPRESSION: 1. No evidence of acute DVT within right upper extremity. 2. 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 Electronically Signed   By: Sandi Mariscal M.D.   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 Now with biopsy proven stage IV disease with lymph node and bone metastasis.  PLAN:    1.  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  Discontinue dose reduced Ibrance 75 mg for 21 days, 14 days off temporarily until patient recovers from her fall.  Continue daily letrozole and proceed with Zometa today as scheduled.  Return to clinic in 2 weeks with repeat laboratory work, further evaluation, and consideration of Zometa.   2.  Osteopenia: Patient's  bone mineral density on January 26, 2016 reported a T score of -0.9 which is considered normal. Continue calcium and vitamin D. Patient is now taking Zometa as above. 3. Pancytopenia: Dose reduce Ibrance as above and 14 days between each cycle.  4. Peripheral neuropathy: Continue gabapentin as prescribed. Neuropathy managed by neurology.  5. Back pain: Resolved.  6.  Left IJ clot: Ultrasound results as above.  Given patient's recent bleed, will discontinue Eliquis at this time. 7.  Anemia: Patient's hemoglobin is significantly decreased, possibly due to recent blood loss.  Return to clinic next week for 2 units of packed red blood cells. 8.  Pneumonia: Patient has been instructed to discontinue doxycycline given her persistent nausea.     Patient expressed understanding and was in agreement with this plan. She also understands that She can call clinic at any time with any questions, concerns, or complaints.   Breast cancer   Staging form: Breast, AJCC 7th Edition     Pathologic stage from 08/11/2014: Stage IIIA (T0, N2a, cM0) - Signed by Lloyd Huger, MD on 08/11/2014   Lloyd Huger, MD   04/08/2017 9:38 AM

## 2017-04-02 ENCOUNTER — Ambulatory Visit: Payer: Medicare Other

## 2017-04-02 MED FILL — IBRANCE 75 MG CAPSULE: 75 | 28 days supply | Qty: 21 | Fill #0

## 2017-04-03 DIAGNOSIS — C7951 Secondary malignant neoplasm of bone: Secondary | ICD-10-CM | POA: Diagnosis not present

## 2017-04-03 DIAGNOSIS — S0003XA Contusion of scalp, initial encounter: Secondary | ICD-10-CM | POA: Diagnosis not present

## 2017-04-03 DIAGNOSIS — S0990XA Unspecified injury of head, initial encounter: Secondary | ICD-10-CM | POA: Diagnosis not present

## 2017-04-03 DIAGNOSIS — Z853 Personal history of malignant neoplasm of breast: Secondary | ICD-10-CM | POA: Diagnosis not present

## 2017-04-03 DIAGNOSIS — I10 Essential (primary) hypertension: Secondary | ICD-10-CM | POA: Diagnosis not present

## 2017-04-03 DIAGNOSIS — S0083XA Contusion of other part of head, initial encounter: Secondary | ICD-10-CM | POA: Diagnosis not present

## 2017-04-03 DIAGNOSIS — F418 Other specified anxiety disorders: Secondary | ICD-10-CM | POA: Diagnosis not present

## 2017-04-03 DIAGNOSIS — R0602 Shortness of breath: Secondary | ICD-10-CM | POA: Insufficient documentation

## 2017-04-03 DIAGNOSIS — F341 Dysthymic disorder: Secondary | ICD-10-CM | POA: Diagnosis not present

## 2017-04-03 DIAGNOSIS — Z7901 Long term (current) use of anticoagulants: Secondary | ICD-10-CM | POA: Diagnosis not present

## 2017-04-05 ENCOUNTER — Ambulatory Visit: Payer: Medicare Other

## 2017-04-05 ENCOUNTER — Other Ambulatory Visit: Payer: Medicare Other

## 2017-04-05 ENCOUNTER — Ambulatory Visit: Payer: Medicare Other | Admitting: Oncology

## 2017-04-05 DIAGNOSIS — C50019 Malignant neoplasm of nipple and areola, unspecified female breast: Secondary | ICD-10-CM | POA: Diagnosis not present

## 2017-04-05 DIAGNOSIS — I361 Nonrheumatic tricuspid (valve) insufficiency: Secondary | ICD-10-CM | POA: Diagnosis not present

## 2017-04-05 DIAGNOSIS — E781 Pure hyperglyceridemia: Secondary | ICD-10-CM | POA: Diagnosis not present

## 2017-04-05 DIAGNOSIS — I340 Nonrheumatic mitral (valve) insufficiency: Secondary | ICD-10-CM | POA: Diagnosis not present

## 2017-04-05 DIAGNOSIS — I82621 Acute embolism and thrombosis of deep veins of right upper extremity: Secondary | ICD-10-CM | POA: Diagnosis not present

## 2017-04-05 DIAGNOSIS — R55 Syncope and collapse: Secondary | ICD-10-CM | POA: Insufficient documentation

## 2017-04-05 DIAGNOSIS — I517 Cardiomegaly: Secondary | ICD-10-CM | POA: Diagnosis not present

## 2017-04-05 DIAGNOSIS — I313 Pericardial effusion (noninflammatory): Secondary | ICD-10-CM | POA: Diagnosis not present

## 2017-04-05 DIAGNOSIS — H47021 Hemorrhage in optic nerve sheath, right eye: Secondary | ICD-10-CM | POA: Diagnosis not present

## 2017-04-05 DIAGNOSIS — I10 Essential (primary) hypertension: Secondary | ICD-10-CM | POA: Diagnosis not present

## 2017-04-05 DIAGNOSIS — E119 Type 2 diabetes mellitus without complications: Secondary | ICD-10-CM | POA: Diagnosis not present

## 2017-04-05 DIAGNOSIS — I82A19 Acute embolism and thrombosis of unspecified axillary vein: Secondary | ICD-10-CM | POA: Diagnosis not present

## 2017-04-05 DIAGNOSIS — Z7984 Long term (current) use of oral hypoglycemic drugs: Secondary | ICD-10-CM | POA: Diagnosis not present

## 2017-04-05 DIAGNOSIS — Z7901 Long term (current) use of anticoagulants: Secondary | ICD-10-CM | POA: Diagnosis not present

## 2017-04-05 DIAGNOSIS — I081 Rheumatic disorders of both mitral and tricuspid valves: Secondary | ICD-10-CM | POA: Diagnosis not present

## 2017-04-05 DIAGNOSIS — R0609 Other forms of dyspnea: Secondary | ICD-10-CM | POA: Diagnosis not present

## 2017-04-05 DIAGNOSIS — I119 Hypertensive heart disease without heart failure: Secondary | ICD-10-CM | POA: Diagnosis not present

## 2017-04-06 ENCOUNTER — Inpatient Hospital Stay (HOSPITAL_BASED_OUTPATIENT_CLINIC_OR_DEPARTMENT_OTHER): Payer: Medicare Other | Admitting: Oncology

## 2017-04-06 ENCOUNTER — Encounter: Payer: Self-pay | Admitting: Oncology

## 2017-04-06 ENCOUNTER — Inpatient Hospital Stay: Payer: Medicare Other

## 2017-04-06 VITALS — BP 152/81 | HR 94 | Temp 96.1°F | Resp 20

## 2017-04-06 DIAGNOSIS — Z9181 History of falling: Secondary | ICD-10-CM

## 2017-04-06 DIAGNOSIS — D61818 Other pancytopenia: Secondary | ICD-10-CM | POA: Diagnosis not present

## 2017-04-06 DIAGNOSIS — D649 Anemia, unspecified: Secondary | ICD-10-CM | POA: Diagnosis not present

## 2017-04-06 DIAGNOSIS — R110 Nausea: Secondary | ICD-10-CM

## 2017-04-06 DIAGNOSIS — G620 Drug-induced polyneuropathy: Secondary | ICD-10-CM | POA: Diagnosis not present

## 2017-04-06 DIAGNOSIS — C50919 Malignant neoplasm of unspecified site of unspecified female breast: Secondary | ICD-10-CM

## 2017-04-06 DIAGNOSIS — M8580 Other specified disorders of bone density and structure, unspecified site: Secondary | ICD-10-CM | POA: Diagnosis not present

## 2017-04-06 DIAGNOSIS — C50912 Malignant neoplasm of unspecified site of left female breast: Secondary | ICD-10-CM

## 2017-04-06 DIAGNOSIS — Z170 Estrogen receptor positive status [ER+]: Secondary | ICD-10-CM

## 2017-04-06 DIAGNOSIS — J189 Pneumonia, unspecified organism: Secondary | ICD-10-CM

## 2017-04-06 DIAGNOSIS — C779 Secondary and unspecified malignant neoplasm of lymph node, unspecified: Secondary | ICD-10-CM | POA: Diagnosis not present

## 2017-04-06 DIAGNOSIS — C7951 Secondary malignant neoplasm of bone: Secondary | ICD-10-CM

## 2017-04-06 LAB — COMPREHENSIVE METABOLIC PANEL
ALBUMIN: 3.5 g/dL (ref 3.5–5.0)
ALK PHOS: 51 U/L (ref 38–126)
ALT: 17 U/L (ref 14–54)
ANION GAP: 10 (ref 5–15)
AST: 37 U/L (ref 15–41)
BILIRUBIN TOTAL: 0.5 mg/dL (ref 0.3–1.2)
BUN: 19 mg/dL (ref 6–20)
CALCIUM: 8.4 mg/dL — AB (ref 8.9–10.3)
CO2: 21 mmol/L — ABNORMAL LOW (ref 22–32)
CREATININE: 1.02 mg/dL — AB (ref 0.44–1.00)
Chloride: 102 mmol/L (ref 101–111)
GFR calc Af Amer: >60 mL/min (ref >60)
GFR calc non Af Amer: 56 mL/min — ABNORMAL LOW (ref >60)
GLUCOSE: 111 mg/dL — AB (ref 65–99)
Potassium: 4.1 mmol/L (ref 3.5–5.1)
Sodium: 133 mmol/L — ABNORMAL LOW (ref 135–145)
TOTAL PROTEIN: 6.3 g/dL — AB (ref 6.5–8.1)

## 2017-04-06 LAB — CBC WITH DIFFERENTIAL/PLATELET
BASOS ABS: 0.1 10*3/uL (ref 0–0.1)
Basophils Relative: 2 %
EOS ABS: 0.1 10*3/uL (ref 0–0.7)
Eosinophils Relative: 2 %
HCT: 16.9 % — ABNORMAL LOW (ref 35.0–47.0)
Hemoglobin: 5.3 g/dL — ABNORMAL LOW (ref 12.0–16.0)
Lymphocytes Relative: 17 %
Lymphs Abs: 0.5 10*3/uL — ABNORMAL LOW (ref 1.0–3.6)
MCH: 29.5 pg (ref 26.0–34.0)
MCHC: 31.7 g/dL — ABNORMAL LOW (ref 32.0–36.0)
MCV: 93.1 fL (ref 80.0–100.0)
Monocytes Absolute: 0.6 10*3/uL (ref 0.2–0.9)
Monocytes Relative: 18 %
NEUTROS ABS: 1.8 10*3/uL (ref 1.4–6.5)
Neutrophils Relative %: 61 %
PLATELETS: 390 10*3/uL (ref 150–440)
RBC: 1.81 MIL/uL — ABNORMAL LOW (ref 3.80–5.20)
RDW: 20.7 % — AB (ref 11.5–14.5)
WBC: 3.1 10*3/uL — ABNORMAL LOW (ref 3.6–11.0)

## 2017-04-06 MED ORDER — SODIUM CHLORIDE 0.9 % IV SOLN
Freq: Once | INTRAVENOUS | Status: AC
  Administered 2017-04-06: 13:00:00 via INTRAVENOUS
  Filled 2017-04-06: qty 1000

## 2017-04-06 MED ORDER — HEPARIN SOD (PORK) LOCK FLUSH 100 UNIT/ML IV SOLN
500.0000 [IU] | Freq: Once | INTRAVENOUS | Status: AC | PRN
  Administered 2017-04-06: 500 [IU]
  Filled 2017-04-06: qty 5

## 2017-04-06 MED ORDER — ZOLEDRONIC ACID 4 MG/100ML IV SOLN
4.0000 mg | Freq: Once | INTRAVENOUS | Status: AC
  Administered 2017-04-06: 4 mg via INTRAVENOUS
  Filled 2017-04-06: qty 100

## 2017-04-06 NOTE — Progress Notes (Signed)
Patient here today for follow up regarding breast cancer. Patient reports fall at home earlier this week.

## 2017-04-09 DIAGNOSIS — I82A19 Acute embolism and thrombosis of unspecified axillary vein: Secondary | ICD-10-CM | POA: Diagnosis not present

## 2017-04-09 DIAGNOSIS — R0609 Other forms of dyspnea: Secondary | ICD-10-CM | POA: Diagnosis not present

## 2017-04-09 DIAGNOSIS — I10 Essential (primary) hypertension: Secondary | ICD-10-CM | POA: Diagnosis not present

## 2017-04-09 DIAGNOSIS — R55 Syncope and collapse: Secondary | ICD-10-CM | POA: Diagnosis not present

## 2017-04-09 DIAGNOSIS — E781 Pure hyperglyceridemia: Secondary | ICD-10-CM | POA: Diagnosis not present

## 2017-04-09 DIAGNOSIS — E119 Type 2 diabetes mellitus without complications: Secondary | ICD-10-CM | POA: Diagnosis not present

## 2017-04-10 ENCOUNTER — Inpatient Hospital Stay: Payer: Medicare Other

## 2017-04-10 ENCOUNTER — Other Ambulatory Visit: Payer: Self-pay | Admitting: Oncology

## 2017-04-10 ENCOUNTER — Other Ambulatory Visit: Payer: Self-pay | Admitting: *Deleted

## 2017-04-10 DIAGNOSIS — D649 Anemia, unspecified: Secondary | ICD-10-CM

## 2017-04-10 LAB — PREPARE RBC (CROSSMATCH)

## 2017-04-10 LAB — SAMPLE TO BLOOD BANK

## 2017-04-10 LAB — ABO/RH: ABO/RH(D): O POS

## 2017-04-10 MED ORDER — HEPARIN SOD (PORK) LOCK FLUSH 100 UNIT/ML IV SOLN
500.0000 [IU] | Freq: Every day | INTRAVENOUS | Status: AC | PRN
  Administered 2017-04-10: 500 [IU]
  Filled 2017-04-10: qty 5

## 2017-04-10 MED ORDER — SODIUM CHLORIDE 0.9 % IV SOLN
250.0000 mL | Freq: Once | INTRAVENOUS | Status: AC
  Administered 2017-04-10: 250 mL via INTRAVENOUS
  Filled 2017-04-10: qty 250

## 2017-04-10 MED ORDER — ACETAMINOPHEN 325 MG PO TABS
650.0000 mg | ORAL_TABLET | Freq: Once | ORAL | Status: AC
  Administered 2017-04-10: 650 mg via ORAL
  Filled 2017-04-10: qty 2

## 2017-04-10 MED ORDER — SODIUM CHLORIDE 0.9% FLUSH
3.0000 mL | INTRAVENOUS | Status: DC | PRN
  Filled 2017-04-10: qty 3

## 2017-04-10 MED ORDER — HEPARIN SOD (PORK) LOCK FLUSH 100 UNIT/ML IV SOLN: 250.0000 [IU] | INTRAVENOUS | Status: DC | PRN

## 2017-04-10 MED ORDER — DIPHENHYDRAMINE HCL 50 MG/ML IJ SOLN
25.0000 mg | Freq: Once | INTRAMUSCULAR | Status: AC
  Administered 2017-04-10: 25 mg via INTRAVENOUS
  Filled 2017-04-10: qty 1

## 2017-04-10 MED ORDER — SODIUM CHLORIDE 0.9% FLUSH
10.0000 mL | INTRAVENOUS | Status: DC | PRN
  Filled 2017-04-10: qty 10

## 2017-04-11 LAB — BPAM RBC
BLOOD PRODUCT EXPIRATION DATE: 201904122359
BLOOD PRODUCT EXPIRATION DATE: 201904122359
ISSUE DATE / TIME: 201903261031
ISSUE DATE / TIME: 201903261231
UNIT TYPE AND RH: 5100
Unit Type and Rh: 5100

## 2017-04-11 LAB — TYPE AND SCREEN
ABO/RH(D): O POS
ANTIBODY SCREEN: NEGATIVE
UNIT DIVISION: 0
Unit division: 0

## 2017-04-12 ENCOUNTER — Encounter: Payer: Self-pay | Admitting: Cardiovascular Disease

## 2017-04-18 ENCOUNTER — Telehealth: Payer: Self-pay | Admitting: *Deleted

## 2017-04-18 ENCOUNTER — Other Ambulatory Visit: Payer: Self-pay | Admitting: Oncology

## 2017-04-18 ENCOUNTER — Encounter: Payer: Self-pay | Admitting: *Deleted

## 2017-04-18 MED ORDER — APIXABAN 5 MG PO TABS: 5.0000 mg | ORAL_TABLET | Freq: Two times a day (BID) | ORAL | 0 refills | Status: DC

## 2017-04-18 NOTE — Telephone Encounter (Signed)
Needs a different provider to send in prescription because insurance does not cover Lorretta Harp, NP

## 2017-04-22 NOTE — Progress Notes (Signed)
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  INTERVAL HISTORY: Patient returns to clinic today for repeat laboratory work and further evaluation.  She has had no further falls and is nearly fully recovered from her most recent injuries.  She is having significant lumbar back pain which is new.  She continues to feel weak and fatigued, but this is improved as well blood transfusion 2 weeks ago. She continues to have neuropathy in her fingertips and feet which is unchanged. She has no other neurologic complaints. She denies any pain. She denies any recent fevers or illnesses.  She denies any chest pain, cough, hemoptysis, or shortness of breath. She denies any nausea, vomiting, constipation, or diarrhea.  She has no urinary complaints.  Patient offers no further specific complaints today.  REVIEW OF SYSTEMS:   Review of Systems  Constitutional: Positive for malaise/fatigue. Negative for fever and weight loss.  Eyes: Negative.  Negative for blurred vision, double vision and pain.  Respiratory: Negative.  Negative for cough and shortness of breath.   Cardiovascular: Negative.  Negative for chest pain and leg swelling.  Gastrointestinal: Negative.  Negative for abdominal pain.  Genitourinary: Negative.   Musculoskeletal: Positive for back pain. Negative for falls.  Skin: Negative.  Negative for rash.  Neurological: Positive for sensory change and weakness. Negative for focal weakness.  Endo/Heme/Allergies: Negative.   Psychiatric/Behavioral: Negative.  Negative for memory loss. The patient is not  nervous/anxious.     As per HPI. Otherwise, a complete review of systems is negative.  PAST MEDICAL HISTORY: Past Medical History:  Diagnosis Date  . Back pain    occasionally  . Breast cancer ([place]) 2009   left  . Depression    takes Paxil and Wellbutrin daily  . Diabetes ([place])    takes Metformin daily  . Genetic testing 02/03/2017   Multi-Cancer panel (83 genes) @ [facility] - No pathogenic mutations detected  . GERD (gastroesophageal reflux disease)   . History of bronchitis 2 yrs ago  . Hyperlipidemia    takes Atorvastatin daily  . Hypertension    takes Losartan-HCTZ daily  . Insomnia    takes Ambien nightly  . Insomnia    takes gabapentin nightly  . Joint pain   . Migraine   . Mood swings   . Osteoarthritis of knee   . Personal history of chemotherapy 12/05/2016   Mets from Breast Cancer  . Personal history of radiation therapy 11/2016  . PONV (postoperative nausea and vomiting)   . Seasonal allergies    takes Allegra daily    PAST SURGICAL HISTORY: Past Surgical History:  Procedure Laterality Date  . BREAST BIOPSY  2009  . cataract surgery Bilateral   . COLONOSCOPY    . JOINT REPLACEMENT Left 2014   knee  . KNEE ARTHROSCOPY Left    x 5  . MASTECTOMY Left   . port a cath placed    . PORTACATH PLACEMENT N/A 09/17/2015   Procedure: INSERTION PORT-A-CATH;  Surgeon: Jules Husbands, MD;  Location: [facility];  Service: General;  Laterality: N/A;  . TOTAL SHOULDER ARTHROPLASTY Right 06/03/2015   Procedure: TOTAL SHOULDER ARTHROPLASTY;  Surgeon: Larkin Ina  Tamera Punt, MD;  Location: [facility];  Service: Orthopedics;  Laterality: Right;  Right total shoulder arthroplasty  . TOTAL SHOULDER REPLACEMENT Right 06/03/2015  . TUBAL LIGATION      FAMILY HISTORY: Father with non-Hodgkin's lymphoma, 2 paternal aunts with breast cancer.     ADVANCED DIRECTIVES:    HEALTH MAINTENANCE: Social History   Tobacco Use  . Smoking status: Never Smoker  . Smokeless tobacco: Never Used    Substance Use Topics  . Alcohol use: Yes    Alcohol/week: 0.0 oz    Comment: occasionally wine  . Drug use: Yes    Types: Marijuana    Comment: cannabis with no extra     Colonoscopy:  PAP:  Bone density:  Lipid panel:  Allergies  Allergen Reactions  . Ace Inhibitors Cough  . Latex Itching  . Morphine And Related Itching    Caused her to itch terribly. Would prefer if given to take with a benadryl  . Other Itching    Freeze spray. Patient stated that she may be able to use it now because she doesn't use it as often.  Marland Kitchen Penicillins Rash    Has patient had a PCN reaction causing immediate rash, facial/tongue/throat swelling, SOB or lightheadedness with hypotension: No Has patient had a PCN reaction causing severe rash involving mucus membranes or skin necrosis: No Has patient had a PCN reaction that required hospitalization No Has patient had a PCN reaction occurring within the last 10 years: No If all of the above answers are "NO", then may proceed with Cephalosporin use.    Current Outpatient Medications  Medication Sig Dispense Refill  . atorvastatin (LIPITOR) 10 MG tablet Take 10 mg by mouth daily at 6 PM.     . buPROPion (WELLBUTRIN XL) 150 MG 24 hr tablet Take 150 mg by mouth every morning.     . Calcium-Magnesium-Vitamin D (CALCIUM 1200+D3 PO) Take 1 Dose by mouth daily.    . fexofenadine (ALLEGRA) 180 MG tablet Take 180 mg by mouth at bedtime.     . gabapentin (NEURONTIN) 300 MG capsule Take 300 mg by mouth. 2 pills in am,2 pills at noon and 3 pills at night    . letrozole (FEMARA) 2.5 MG tablet Take 1 tablet (2.5 mg total) by mouth daily. 15 tablet 1  . metFORMIN (GLUCOPHAGE) 850 MG tablet Take 850 mg by mouth 2 (two) times daily with a meal.     . PARoxetine (PAXIL) 40 MG tablet Take 40 mg by mouth every morning.     . Zolpidem Tartrate (AMBIEN PO) Take 10 mg by mouth.     Marland Kitchen apixaban (ELIQUIS) 5 MG TABS tablet Take 1 tablet (5 mg total) by mouth 2 (two) times daily.  (Patient not taking: Reported on 04/25/2017) 60 tablet 0  . aspirin EC 81 MG tablet Take 81 mg by mouth daily.     Marland Kitchen b complex vitamins capsule Take 1 capsule by mouth daily.    Marland Kitchen HYDROcodone-acetaminophen (NORCO) 5-325 MG tablet Take 1-2 tablets by mouth every 4 (four) hours as needed for moderate pain. (Patient not taking: Reported on 04/25/2017) 30 tablet 0  . ibuprofen (ADVIL,MOTRIN) 800 MG tablet Take 1 tablet (800 mg total) every 8 (eight) hours as needed by mouth for mild pain. (Patient not taking: Reported on 04/25/2017) 30 tablet 0  . lidocaine-prilocaine (EMLA) cream Apply 1 application topically as needed. Apply to port 1-2 hours prior to chemotherapy appointment. Cover with plastic wrap. (Patient not taking: Reported  on 04/25/2017) 30 g 0  . losartan-hydrochlorothiazide (HYZAAR) 100-25 MG per tablet Take 1 tablet by mouth every morning.     . ondansetron (ZOFRAN ODT) 4 MG disintegrating tablet Take 1 tablet (4 mg total) by mouth every 8 (eight) hours as needed for nausea or vomiting. (Patient not taking: Reported on 04/25/2017) 20 tablet 0  . palbociclib (IBRANCE) 75 MG capsule Take 1 capsule (75 mg total) by mouth daily with breakfast. Take for 21 days, then 14 days off. (Patient not taking: Reported on 04/25/2017) 21 capsule 5   Current Facility-Administered Medications  Medication Dose Route Frequency Provider Last Rate Last Dose  . guaiFENesin-dextromethorphan (ROBITUSSIN DM) 100-10 MG/5ML syrup 5 mL  5 mL Oral Once Lloyd Huger, MD        OBJECTIVE: Vitals:   04/25/17 1056  BP: (!) 143/79  Pulse: 84  Resp: 18  Temp: (!) 96.1 F (35.6 C)     Body mass index is 34.04 kg/m.    ECOG FS:0 - Asymptomatic  General: Well-developed, well-nourished, no acute distress. Eyes: Pink conjunctiva, anicteric sclera. HEENT: Ecchymosis has resolved. Breast: Left chest wall without evidence of recurrence.  Right breast and axilla without lumps or masses.  Exam deferred today. Lungs: Clear  to auscultation bilaterally. Heart: Regular rate and rhythm. No rubs, murmurs, or gallops. Abdomen: Soft, nontender, nondistended. No organomegaly noted, normoactive bowel sounds. Musculoskeletal: No edema, cyanosis, or clubbing. Neuro: Alert, answering all questions appropriately. Cranial nerves grossly intact. Skin: No rashes or petechiae noted. Psych: Normal affect.  LAB RESULTS:  Lab Results  Component Value Date   NA 135 04/25/2017   K 4.1 04/25/2017   CL 105 04/25/2017   CO2 22 04/25/2017   GLUCOSE 125 (H) 04/25/2017   BUN 17 04/25/2017   CREATININE 0.96 04/25/2017   CALCIUM 9.0 04/25/2017   PROT 6.8 04/25/2017   ALBUMIN 3.8 04/25/2017   AST 40 04/25/2017   ALT 23 04/25/2017   ALKPHOS 55 04/25/2017   BILITOT 0.3 04/25/2017   GFRNONAA >60 04/25/2017   GFRAA >60 04/25/2017    Lab Results  Component Value Date   WBC 3.9 04/25/2017   NEUTROABS 2.3 04/25/2017   HGB 7.9 (L) 04/25/2017   HCT 24.6 (L) 04/25/2017   MCV 82.7 04/25/2017   PLT 388 04/25/2017     STUDIES: US Venous Img Upper Uni Right  Result Date: 03/29/2017 CLINICAL DATA:  Follow-up blood clot around Port a catheter tubing. EXAM: RIGHT UPPER EXTREMITY VENOUS DOPPLER ULTRASOUND TECHNIQUE: Gray-scale sonography with graded compression, as well as color Doppler and duplex ultrasound were performed to evaluate the upper extremity deep venous system from the level of the subclavian vein and including the jugular, axillary, basilic, radial, ulnar and upper cephalic vein. Spectral Doppler was utilized to evaluate flow at rest and with distal augmentation maneuvers. COMPARISON:  Right upper extremity venous Doppler ultrasound - 02/22/2017 FINDINGS: Contralateral Subclavian Vein: Respiratory phasicity is normal and symmetric with the symptomatic side. No evidence of thrombus. Normal compressibility. Internal Jugular Vein: The amount of nonocclusive thrombus surrounding the central aspect of the right internal jugular  approach port a catheter tubing appears morphologically similar to the 07/2017 examination. Subclavian Vein: No evidence of thrombus. Normal compressibility, respiratory phasicity and response to augmentation. Axillary Vein: No evidence of thrombus. Normal compressibility, respiratory phasicity and response to augmentation. Cephalic Vein: No evidence of thrombus. Normal compressibility, respiratory phasicity and response to augmentation. Basilic Vein: No evidence of thrombus. Normal compressibility, respiratory phasicity and response to  augmentation. Brachial Veins: No evidence of thrombus. Normal compressibility, respiratory phasicity and response to augmentation. Radial Veins: No evidence of thrombus. Normal compressibility, respiratory phasicity and response to augmentation. Ulnar Veins: No evidence of thrombus. Normal compressibility, respiratory phasicity and response to augmentation. Venous Reflux:  None visualized. Other Findings:  None visualized. IMPRESSION: 1. No evidence of acute DVT within right upper extremity. 2. 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 Electronically Signed   By: Sandi Mariscal M.D.   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 Now with biopsy proven stage IV disease with lymph node and bone metastasis.  PLAN:    1.  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  Continue to hold dose reduced Ibrance 75 mg for 21 days, 14 days off.  Continue daily letrozole.  Patient will be due for Zometa in 2 weeks.  Return to clinic at at that time  with repeat laboratory work, further evaluation, and consideration of Zometa.  We will also consider reinitiating Ibrance then. 2.  Osteopenia: Patient's bone mineral density on January 26, 2016 reported a T score of -0.9 which is considered normal. Continue calcium and vitamin D.  Zometa as above.   3. Pancytopenia: Other than anemia, has resolved.  Upon reinitiation, will continue with dose reduced Ibrance as above and 14 days between each cycle.  4. Peripheral neuropathy: Chronic and unchanged.  Continue gabapentin as prescribed. Neuropathy managed by neurology.  5. Back pain: Resolved.  6.  Left IJ clot: Ultrasound results reviewed independently.  Patient has been instructed to discontinue Eliquis. 7.  Anemia: Hemoglobin continues to be decreased, but improved with 2 units of packed red blood cells recently.  She does not require transfusion at this time. 8.  Pneumonia: Resolved. 9.  Back pain: We will get MRI to further evaluate.  Approximately 30 minutes was spent in discussion of which greater than 50% was consultation.  Patient expressed understanding and was in agreement with this plan. She also understands that She can call clinic at any time with any questions, concerns, or complaints.   Breast cancer   Staging form: Breast, AJCC 7th Edition     Pathologic stage from 08/11/2014: Stage IIIA (T0, N2a, cM0) - Signed by Lloyd Huger, MD on 08/11/2014   Lloyd Huger, MD   04/27/2017 1:29 PM

## 2017-04-25 ENCOUNTER — Inpatient Hospital Stay (HOSPITAL_BASED_OUTPATIENT_CLINIC_OR_DEPARTMENT_OTHER): Payer: Medicare Other | Admitting: Oncology

## 2017-04-25 ENCOUNTER — Other Ambulatory Visit: Payer: Self-pay

## 2017-04-25 ENCOUNTER — Inpatient Hospital Stay: Payer: Medicare Other | Attending: Oncology

## 2017-04-25 VITALS — BP 143/79 | HR 84 | Temp 96.1°F | Resp 18 | Wt 198.3 lb

## 2017-04-25 DIAGNOSIS — D649 Anemia, unspecified: Secondary | ICD-10-CM

## 2017-04-25 DIAGNOSIS — C50912 Malignant neoplasm of unspecified site of left female breast: Secondary | ICD-10-CM | POA: Diagnosis not present

## 2017-04-25 DIAGNOSIS — C779 Secondary and unspecified malignant neoplasm of lymph node, unspecified: Secondary | ICD-10-CM | POA: Diagnosis not present

## 2017-04-25 DIAGNOSIS — M545 Low back pain, unspecified: Secondary | ICD-10-CM

## 2017-04-25 DIAGNOSIS — G620 Drug-induced polyneuropathy: Secondary | ICD-10-CM | POA: Insufficient documentation

## 2017-04-25 DIAGNOSIS — C50919 Malignant neoplasm of unspecified site of unspecified female breast: Secondary | ICD-10-CM

## 2017-04-25 DIAGNOSIS — C7951 Secondary malignant neoplasm of bone: Secondary | ICD-10-CM | POA: Insufficient documentation

## 2017-04-25 DIAGNOSIS — Z170 Estrogen receptor positive status [ER+]: Secondary | ICD-10-CM | POA: Diagnosis not present

## 2017-04-25 DIAGNOSIS — I82C12 Acute embolism and thrombosis of left internal jugular vein: Secondary | ICD-10-CM

## 2017-04-25 DIAGNOSIS — M8580 Other specified disorders of bone density and structure, unspecified site: Secondary | ICD-10-CM | POA: Insufficient documentation

## 2017-04-25 LAB — COMPREHENSIVE METABOLIC PANEL
ALBUMIN: 3.8 g/dL (ref 3.5–5.0)
ALK PHOS: 55 U/L (ref 38–126)
ALT: 23 U/L (ref 14–54)
AST: 40 U/L (ref 15–41)
Anion gap: 8 (ref 5–15)
BILIRUBIN TOTAL: 0.3 mg/dL (ref 0.3–1.2)
BUN: 17 mg/dL (ref 6–20)
CALCIUM: 9 mg/dL (ref 8.9–10.3)
CO2: 22 mmol/L (ref 22–32)
Chloride: 105 mmol/L (ref 101–111)
Creatinine, Ser: 0.96 mg/dL (ref 0.44–1.00)
GFR calc Af Amer: >60 mL/min (ref >60)
GFR calc non Af Amer: >60 mL/min (ref >60)
GLUCOSE: 125 mg/dL — AB (ref 65–99)
Potassium: 4.1 mmol/L (ref 3.5–5.1)
SODIUM: 135 mmol/L (ref 135–145)
TOTAL PROTEIN: 6.8 g/dL (ref 6.5–8.1)

## 2017-04-25 LAB — CBC WITH DIFFERENTIAL/PLATELET
BASOS ABS: 0.1 10*3/uL (ref 0–0.1)
Basophils Relative: 2 %
EOS PCT: 8 %
Eosinophils Absolute: 0.3 10*3/uL (ref 0–0.7)
HEMATOCRIT: 24.6 % — AB (ref 35.0–47.0)
HEMOGLOBIN: 7.9 g/dL — AB (ref 12.0–16.0)
Lymphocytes Relative: 14 %
Lymphs Abs: 0.6 10*3/uL — ABNORMAL LOW (ref 1.0–3.6)
MCH: 26.7 pg (ref 26.0–34.0)
MCHC: 32.2 g/dL (ref 32.0–36.0)
MCV: 82.7 fL (ref 80.0–100.0)
MONOS PCT: 15 %
Monocytes Absolute: 0.6 10*3/uL (ref 0.2–0.9)
Neutro Abs: 2.3 10*3/uL (ref 1.4–6.5)
Neutrophils Relative %: 61 %
Platelets: 388 10*3/uL (ref 150–440)
RBC: 2.98 MIL/uL — AB (ref 3.80–5.20)
RDW: 22.7 % — ABNORMAL HIGH (ref 11.5–14.5)
WBC: 3.9 10*3/uL (ref 3.6–11.0)

## 2017-04-25 LAB — SAMPLE TO BLOOD BANK

## 2017-04-25 NOTE — Progress Notes (Signed)
Here for follow up. Stated she is feeling better than other days per pt

## 2017-04-26 DIAGNOSIS — R0609 Other forms of dyspnea: Secondary | ICD-10-CM | POA: Diagnosis not present

## 2017-05-04 ENCOUNTER — Ambulatory Visit
Admission: RE | Admit: 2017-05-04 | Discharge: 2017-05-04 | Disposition: A | Discharge status: Home / Self Care | Payer: Medicare Other | Source: Ambulatory Visit | Attending: Oncology | Admitting: Oncology

## 2017-05-04 DIAGNOSIS — M545 Low back pain, unspecified: Secondary | ICD-10-CM

## 2017-05-04 DIAGNOSIS — C7951 Secondary malignant neoplasm of bone: Secondary | ICD-10-CM | POA: Diagnosis not present

## 2017-05-04 DIAGNOSIS — M5127 Other intervertebral disc displacement, lumbosacral region: Secondary | ICD-10-CM | POA: Insufficient documentation

## 2017-05-04 DIAGNOSIS — M5136 Other intervertebral disc degeneration, lumbar region: Secondary | ICD-10-CM | POA: Insufficient documentation

## 2017-05-04 DIAGNOSIS — M5126 Other intervertebral disc displacement, lumbar region: Secondary | ICD-10-CM | POA: Diagnosis not present

## 2017-05-04 MED ORDER — GADOBENATE DIMEGLUMINE 529 MG/ML IV SOLN
20.0000 mL | Freq: Once | INTRAVENOUS | Status: AC | PRN
  Administered 2017-05-04: 18 mL via INTRAVENOUS

## 2017-05-06 NOTE — Progress Notes (Signed)
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  INTERVAL HISTORY: Patient returns to clinic today for further evaluation, discussion of her MRI results, and continuation of Zometa.  She is now fully recovered and back to her baseline.  She continues to have significant back pain.  She continues to have persistent weakness or fatigue.  She continues to have neuropathy in her fingertips and feet which is unchanged. She has no other neurologic complaints. She denies any other pain. She denies any recent fevers or illnesses.  She denies any chest pain, cough, hemoptysis, or shortness of breath. She denies any nausea, vomiting, constipation, or diarrhea.  She has no urinary complaints.  Patient offers no further specific complaints today.  REVIEW OF SYSTEMS:   Review of Systems  Constitutional: Positive for malaise/fatigue. Negative for fever and weight loss.  Eyes: Negative.  Negative for blurred vision, double vision and pain.  Respiratory: Negative.  Negative for cough and shortness of breath.   Cardiovascular: Negative.  Negative for chest pain and leg swelling.  Gastrointestinal: Negative.  Negative for abdominal pain.  Genitourinary: Negative.   Musculoskeletal: Positive for back pain. Negative for falls.  Skin: Negative.  Negative for rash.  Neurological: Positive for tingling, sensory change and weakness. Negative for focal weakness.  Endo/Heme/Allergies: Negative.   Psychiatric/Behavioral: Negative.  Negative for memory loss. The patient is not nervous/anxious.     As per HPI. Otherwise, a  complete review of systems is negative.  PAST MEDICAL HISTORY: Past Medical History:  Diagnosis Date  . Back pain    occasionally  . Breast cancer ([place]) 2009   left  . Depression    takes Paxil and Wellbutrin daily  . Diabetes ([place])    takes Metformin daily  . Genetic testing 02/03/2017   Multi-Cancer panel (83 genes) @ [facility] - No pathogenic mutations detected  . GERD (gastroesophageal reflux disease)   . History of bronchitis 2 yrs ago  . Hyperlipidemia    takes Atorvastatin daily  . Hypertension    takes Losartan-HCTZ daily  . Insomnia    takes Ambien nightly  . Insomnia    takes gabapentin nightly  . Joint pain   . Migraine   . Mood swings   . Osteoarthritis of knee   . Personal history of chemotherapy 12/05/2016   Mets from Breast Cancer  . Personal history of radiation therapy 11/2016  . PONV (postoperative nausea and vomiting)   . Seasonal allergies    takes Allegra daily    PAST SURGICAL HISTORY: Past Surgical History:  Procedure Laterality Date  . BREAST BIOPSY  2009  . cataract surgery Bilateral   . COLONOSCOPY    . JOINT REPLACEMENT Left 2014   knee  . KNEE ARTHROSCOPY Left    x 5  . MASTECTOMY Left   . port a cath placed    . PORTACATH PLACEMENT N/A 09/17/2015   Procedure: INSERTION PORT-A-CATH;  Surgeon: Jules Husbands, MD;  Location: [facility];  Service: General;  Laterality: N/A;  . TOTAL SHOULDER ARTHROPLASTY Right 06/03/2015   Procedure: TOTAL SHOULDER ARTHROPLASTY;  Surgeon: Tania Ade, MD;  Location: [facility];  Service: Orthopedics;  Laterality:  Right;  Right total shoulder arthroplasty  . TOTAL SHOULDER REPLACEMENT Right 06/03/2015  . TUBAL LIGATION      FAMILY HISTORY: Father with non-Hodgkin's lymphoma, 2 paternal aunts with breast cancer.     ADVANCED DIRECTIVES:    HEALTH MAINTENANCE: Social History   Tobacco Use  . Smoking status: Never Smoker  . Smokeless tobacco: Never Used  Substance Use Topics  . Alcohol use: Yes     Alcohol/week: 0.0 oz    Comment: occasionally wine  . Drug use: Yes    Types: Marijuana    Comment: cannabis with no extra     Colonoscopy:  PAP:  Bone density:  Lipid panel:  Allergies  Allergen Reactions  . Ace Inhibitors Cough  . Latex Itching  . Morphine And Related Itching    Caused her to itch terribly. Would prefer if given to take with a benadryl  . Other Itching    Freeze spray. Patient stated that she may be able to use it now because she doesn't use it as often.  Marland Kitchen Penicillins Rash    Has patient had a PCN reaction causing immediate rash, facial/tongue/throat swelling, SOB or lightheadedness with hypotension: No Has patient had a PCN reaction causing severe rash involving mucus membranes or skin necrosis: No Has patient had a PCN reaction that required hospitalization No Has patient had a PCN reaction occurring within the last 10 years: No If all of the above answers are "NO", then may proceed with Cephalosporin use.    Current Outpatient Medications  Medication Sig Dispense Refill  . atorvastatin (LIPITOR) 10 MG tablet Take 10 mg by mouth daily at 6 PM.     . buPROPion (WELLBUTRIN XL) 150 MG 24 hr tablet Take 150 mg by mouth every morning.     . Calcium-Magnesium-Vitamin D (CALCIUM 1200+D3 PO) Take 1 Dose by mouth daily.    . fexofenadine (ALLEGRA) 180 MG tablet Take 180 mg by mouth at bedtime.     . gabapentin (NEURONTIN) 300 MG capsule Take 300 mg by mouth. 2 pills in am,2 pills at noon and 3 pills at night    . letrozole (FEMARA) 2.5 MG tablet Take 1 tablet (2.5 mg total) by mouth daily. 15 tablet 1  . metFORMIN (GLUCOPHAGE) 850 MG tablet Take 850 mg by mouth 2 (two) times daily with a meal.     . PARoxetine (PAXIL) 40 MG tablet Take 40 mg by mouth every morning.     Marland Kitchen apixaban (ELIQUIS) 5 MG TABS tablet Take 1 tablet (5 mg total) by mouth 2 (two) times daily. (Patient not taking: Reported on 04/25/2017) 60 tablet 0  . aspirin EC 81 MG tablet Take 81 mg by mouth  daily.     Marland Kitchen b complex vitamins capsule Take 1 capsule by mouth daily.    Marland Kitchen HYDROcodone-acetaminophen (NORCO) 5-325 MG tablet Take 1-2 tablets by mouth every 4 (four) hours as needed for moderate pain. (Patient not taking: Reported on 04/25/2017) 30 tablet 0  . ibuprofen (ADVIL,MOTRIN) 800 MG tablet Take 1 tablet (800 mg total) every 8 (eight) hours as needed by mouth for mild pain. (Patient not taking: Reported on 04/25/2017) 30 tablet 0  . lidocaine-prilocaine (EMLA) cream Apply 1 application topically as needed. Apply to port 1-2 hours prior to chemotherapy appointment. Cover with plastic wrap. (Patient not taking: Reported on 04/25/2017) 30 g 0  . losartan-hydrochlorothiazide (HYZAAR) 100-25 MG per tablet Take 1 tablet by mouth every morning.     . ondansetron (  ZOFRAN ODT) 4 MG disintegrating tablet Take 1 tablet (4 mg total) by mouth every 8 (eight) hours as needed for nausea or vomiting. (Patient not taking: Reported on 04/25/2017) 20 tablet 0  . palbociclib (IBRANCE) 75 MG capsule Take 1 capsule (75 mg total) by mouth daily with breakfast. Take for 21 days, then 14 days off. (Patient not taking: Reported on 04/25/2017) 21 capsule 5  . Zolpidem Tartrate (AMBIEN PO) Take 10 mg by mouth.      Current Facility-Administered Medications  Medication Dose Route Frequency Provider Last Rate Last Dose  . guaiFENesin-dextromethorphan (ROBITUSSIN DM) 100-10 MG/5ML syrup 5 mL  5 mL Oral Once Lloyd Huger, MD       Facility-Administered Medications Ordered in Other Visits  Medication Dose Route Frequency Provider Last Rate Last Dose  . heparin lock flush 100 unit/mL  250 Units Intracatheter PRN Lloyd Huger, MD        OBJECTIVE: Vitals:   05/09/17 1021  BP: (!) 146/79  Pulse: 87  Resp: 18  Temp: (!) 96.5 F (35.8 C)     Body mass index is 34.16 kg/m.    ECOG FS:0 - Asymptomatic   General: Well-developed, well-nourished, no acute distress. Eyes: Pink conjunctiva, anicteric  sclera. Breast: Left chest wall without evidence of recurrence.  Right breast and axilla without lumps or masses.  Exam deferred today. Lungs: Clear to auscultation bilaterally. Heart: Regular rate and rhythm. No rubs, murmurs, or gallops. Abdomen: Soft, nontender, nondistended. No organomegaly noted, normoactive bowel sounds. Musculoskeletal: No edema, cyanosis, or clubbing. Neuro: Alert, answering all questions appropriately. Cranial nerves grossly intact. Skin: No rashes or petechiae noted. Psych: Normal affect.  LAB RESULTS:  Lab Results  Component Value Date   NA 136 05/09/2017   K 4.1 05/09/2017   CL 107 05/09/2017   CO2 21 (L) 05/09/2017   GLUCOSE 157 (H) 05/09/2017   BUN 17 05/09/2017   CREATININE 0.98 05/09/2017   CALCIUM 8.9 05/09/2017   PROT 6.4 (L) 05/09/2017   ALBUMIN 3.7 05/09/2017   AST 48 (H) 05/09/2017   ALT 20 05/09/2017   ALKPHOS 49 05/09/2017   BILITOT 0.5 05/09/2017   GFRNONAA 59 (L) 05/09/2017   GFRAA >60 05/09/2017    Lab Results  Component Value Date   WBC 3.6 05/09/2017   NEUTROABS 2.3 05/09/2017   HGB 7.4 (L) 05/09/2017   HCT 23.3 (L) 05/09/2017   MCV 79.5 (L) 05/09/2017   PLT 302 05/09/2017     STUDIES: Mr Lumbar Spine W Wo Contrast  Result Date: 05/04/2017 CLINICAL DATA:  Low back pain. Metastatic breast cancer. The patient fell on 04/02/2017. EXAM: MRI LUMBAR SPINE WITHOUT AND WITH CONTRAST TECHNIQUE: Multiplanar and multiecho pulse sequences of the lumbar spine were obtained without and with intravenous contrast. CONTRAST:  74m MULTIHANCE GADOBENATE DIMEGLUMINE 529 MG/ML IV SOLN COMPARISON:  CT scans dated 02/22/2017 and 12/25/2016 FINDINGS: Segmentation:  Standard. Alignment:  Minimal retrolisthesis at T12-L1, L1-2, and L2-3. Vertebrae: Bone metastases throughout the lumbar spine and sacrum as demonstrated on prior CT scans. No visible pathologic fractures. No visible tumor extending into the spinal canal. Conus medullaris and cauda  equina: Conus extends to the L2 level. Conus and cauda equina appear normal. Paraspinal and other soft tissues: There is a slight edema and enhancement of the posterior paraspinal musculature at L3-4 and L4-5 and L5-S1. This is nonspecific but could represent muscle strain. Incidental note is made of multiple parapelvic cysts in the left kidney, unchanged. Disc levels: T11-12:  Disc space narrowing. Small broad-based disc bulge with no neural impingement. T12-L1: Chronic disc space narrowing with a small broad-based disc protrusion with slight retrolisthesis of T12 and L1. No neural impingement. L1-2: Marked disc space narrowing. Small broad-based disc protrusion with slight retrolisthesis without significant spinal stenosis or neural impingement. Mild to moderate right foraminal stenosis. L2-3: Disc space narrowing. Small broad-based disc protrusion with a small extrusion centrally extending inferiorly behind the body of L2 without focal neural impingement. Slight hypertrophy of the ligamentum flavum and facet joints. Mild spinal stenosis. L3-4: Small broad-based disc protrusion asymmetric to the right without focal neural impingement. Minimal degenerative changes of the facet joints. L4-5: Small broad-based disc protrusion asymmetric into the left lateral recess with compression of the lateral recess which could affect the left L5 nerve, best seen on image 30 of series 5. L5-S1: Focal 11 x 9 x 5 mm soft disc extrusion into the right lateral recess, best seen on images 35 of series 5 and series 6 and on image 8 of series 7. This should affect the right S1 nerve. IMPRESSION: 1. No acute fracture or acute bone destruction. 2. Extensive metastatic disease in the spine and sacrum with no extension of tumor into the spinal canal. These findings have been present on prior CT scans. 3. Prominent soft disc extrusion at L5-S1 to the right compressing the right S1 nerve. 4. Small soft disc protrusion into the left lateral  recess at L4-5 which could affect the left L5 nerve. 5. Multilevel degenerative disc disease throughout the remainder of the lumbar spine. Electronically Signed   By: Lorriane Shire M.D.   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 Now with biopsy proven stage IV disease with lymph node and bone metastasis.  PLAN:    1.  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  Continue to hold dose reduced Ibrance 75 mg for 21 days, 14 days off.  Continue daily letrozole.  Proceed with Zometa today.  Return to clinic in 4 weeks for further evaluation, consideration of Zometa, and consideration of reinitiating Ibrance then. 2.  Osteopenia: Patient's bone mineral density on January 26, 2016 reported a T score of -0.9 which is considered normal. Continue calcium and vitamin D.  Zometa as above.   3.  Anemia: Patient is symptomatic, therefore she will return on Friday to receive 1 unit of packed red blood cells.    4. Peripheral neuropathy: Chronic and unchanged.  Continue gabapentin as prescribed. Neuropathy managed by neurology.  5. Back pain: MRI results as above.  Unclear if patient's pain is secondary to malignancy or possibly arthritis.  Patient was given a referral to radiation oncology for further evaluation. 6.  Left IJ clot: Ultrasound results reviewed independently.  Patient has been instructed to discontinue Eliquis.   Patient expressed understanding and was in agreement with this plan. She also understands that She can call clinic at any time with any questions, concerns, or complaints.   Breast cancer   Staging form: Breast, AJCC 7th Edition     Pathologic stage from 08/11/2014:  Stage IIIA (T0, N2a, cM0) - Signed by Lloyd Huger, MD on 08/11/2014   Lloyd Huger, MD   05/11/2017 2:14 PM

## 2017-05-09 ENCOUNTER — Inpatient Hospital Stay: Payer: Medicare Other

## 2017-05-09 ENCOUNTER — Other Ambulatory Visit: Payer: Self-pay

## 2017-05-09 ENCOUNTER — Inpatient Hospital Stay (HOSPITAL_BASED_OUTPATIENT_CLINIC_OR_DEPARTMENT_OTHER): Payer: Medicare Other | Admitting: Oncology

## 2017-05-09 VITALS — BP 146/79 | HR 87 | Temp 96.5°F | Resp 18 | Wt 199.0 lb

## 2017-05-09 DIAGNOSIS — C50912 Malignant neoplasm of unspecified site of left female breast: Secondary | ICD-10-CM

## 2017-05-09 DIAGNOSIS — R531 Weakness: Secondary | ICD-10-CM

## 2017-05-09 DIAGNOSIS — G620 Drug-induced polyneuropathy: Secondary | ICD-10-CM

## 2017-05-09 DIAGNOSIS — Z170 Estrogen receptor positive status [ER+]: Secondary | ICD-10-CM | POA: Diagnosis not present

## 2017-05-09 DIAGNOSIS — D649 Anemia, unspecified: Secondary | ICD-10-CM

## 2017-05-09 DIAGNOSIS — M8580 Other specified disorders of bone density and structure, unspecified site: Secondary | ICD-10-CM | POA: Diagnosis not present

## 2017-05-09 DIAGNOSIS — R5383 Other fatigue: Secondary | ICD-10-CM

## 2017-05-09 DIAGNOSIS — M545 Low back pain: Secondary | ICD-10-CM

## 2017-05-09 DIAGNOSIS — C50919 Malignant neoplasm of unspecified site of unspecified female breast: Secondary | ICD-10-CM

## 2017-05-09 DIAGNOSIS — C7951 Secondary malignant neoplasm of bone: Secondary | ICD-10-CM | POA: Diagnosis not present

## 2017-05-09 DIAGNOSIS — C779 Secondary and unspecified malignant neoplasm of lymph node, unspecified: Secondary | ICD-10-CM

## 2017-05-09 DIAGNOSIS — I82C12 Acute embolism and thrombosis of left internal jugular vein: Secondary | ICD-10-CM | POA: Diagnosis not present

## 2017-05-09 LAB — CBC WITH DIFFERENTIAL/PLATELET
Basophils Absolute: 0.1 10*3/uL (ref 0–0.1)
Basophils Relative: 2 %
EOS ABS: 0.2 10*3/uL (ref 0–0.7)
EOS PCT: 6 %
HCT: 23.3 % — ABNORMAL LOW (ref 35.0–47.0)
Hemoglobin: 7.4 g/dL — ABNORMAL LOW (ref 12.0–16.0)
LYMPHS ABS: 0.5 10*3/uL — AB (ref 1.0–3.6)
LYMPHS PCT: 14 %
MCH: 25.2 pg — AB (ref 26.0–34.0)
MCHC: 31.7 g/dL — AB (ref 32.0–36.0)
MCV: 79.5 fL — AB (ref 80.0–100.0)
MONOS PCT: 14 %
Monocytes Absolute: 0.5 10*3/uL (ref 0.2–0.9)
Neutro Abs: 2.3 10*3/uL (ref 1.4–6.5)
Neutrophils Relative %: 64 %
Platelets: 302 10*3/uL (ref 150–440)
RBC: 2.93 MIL/uL — AB (ref 3.80–5.20)
RDW: 23 % — ABNORMAL HIGH (ref 11.5–14.5)
WBC: 3.6 10*3/uL (ref 3.6–11.0)

## 2017-05-09 LAB — COMPREHENSIVE METABOLIC PANEL
ALBUMIN: 3.7 g/dL (ref 3.5–5.0)
ALT: 20 U/L (ref 14–54)
AST: 48 U/L — AB (ref 15–41)
Alkaline Phosphatase: 49 U/L (ref 38–126)
Anion gap: 8 (ref 5–15)
BUN: 17 mg/dL (ref 6–20)
CHLORIDE: 107 mmol/L (ref 101–111)
CO2: 21 mmol/L — AB (ref 22–32)
Calcium: 8.9 mg/dL (ref 8.9–10.3)
Creatinine, Ser: 0.98 mg/dL (ref 0.44–1.00)
GFR calc Af Amer: >60 mL/min (ref >60)
GFR, EST NON AFRICAN AMERICAN: 59 mL/min — AB (ref >60)
GLUCOSE: 157 mg/dL — AB (ref 65–99)
Potassium: 4.1 mmol/L (ref 3.5–5.1)
SODIUM: 136 mmol/L (ref 135–145)
Total Bilirubin: 0.5 mg/dL (ref 0.3–1.2)
Total Protein: 6.4 g/dL — ABNORMAL LOW (ref 6.5–8.1)

## 2017-05-09 LAB — SAMPLE TO BLOOD BANK

## 2017-05-09 LAB — PREPARE RBC (CROSSMATCH)

## 2017-05-09 MED ORDER — SODIUM CHLORIDE 0.9 % IV SOLN
INTRAVENOUS | Status: DC
Start: 2017-05-09 — End: 2017-05-09
  Administered 2017-05-09: 11:00:00 via INTRAVENOUS
  Filled 2017-05-09: qty 1000

## 2017-05-09 MED ORDER — ZOLEDRONIC ACID 4 MG/100ML IV SOLN
4.0000 mg | Freq: Once | INTRAVENOUS | Status: AC
  Administered 2017-05-09: 4 mg via INTRAVENOUS
  Filled 2017-05-09: qty 100

## 2017-05-09 MED ORDER — HEPARIN SOD (PORK) LOCK FLUSH 100 UNIT/ML IV SOLN
500.0000 [IU] | Freq: Once | INTRAVENOUS | Status: DC | PRN
  Filled 2017-05-09: qty 5

## 2017-05-09 NOTE — Progress Notes (Signed)
Here for follow up. Overall pt stated day by day her pain level is better

## 2017-05-11 ENCOUNTER — Inpatient Hospital Stay: Payer: Medicare Other

## 2017-05-11 DIAGNOSIS — Z170 Estrogen receptor positive status [ER+]: Secondary | ICD-10-CM | POA: Diagnosis not present

## 2017-05-11 DIAGNOSIS — D649 Anemia, unspecified: Secondary | ICD-10-CM | POA: Diagnosis not present

## 2017-05-11 DIAGNOSIS — C7951 Secondary malignant neoplasm of bone: Secondary | ICD-10-CM | POA: Diagnosis not present

## 2017-05-11 DIAGNOSIS — C50912 Malignant neoplasm of unspecified site of left female breast: Secondary | ICD-10-CM | POA: Diagnosis not present

## 2017-05-11 DIAGNOSIS — G620 Drug-induced polyneuropathy: Secondary | ICD-10-CM | POA: Diagnosis not present

## 2017-05-11 DIAGNOSIS — I82C12 Acute embolism and thrombosis of left internal jugular vein: Secondary | ICD-10-CM | POA: Diagnosis not present

## 2017-05-11 DIAGNOSIS — C50919 Malignant neoplasm of unspecified site of unspecified female breast: Secondary | ICD-10-CM

## 2017-05-11 MED ORDER — SODIUM CHLORIDE 0.9% FLUSH
10.0000 mL | INTRAVENOUS | Status: AC | PRN
  Administered 2017-05-11: 10 mL
  Filled 2017-05-11: qty 10

## 2017-05-11 MED ORDER — SODIUM CHLORIDE 0.9 % IV SOLN
250.0000 mL | Freq: Once | INTRAVENOUS | Status: AC
  Administered 2017-05-11: 250 mL via INTRAVENOUS
  Filled 2017-05-11: qty 250

## 2017-05-11 MED ORDER — HEPARIN SOD (PORK) LOCK FLUSH 100 UNIT/ML IV SOLN: 250.0000 [IU] | INTRAVENOUS | Status: DC | PRN

## 2017-05-11 MED ORDER — HEPARIN SOD (PORK) LOCK FLUSH 100 UNIT/ML IV SOLN
500.0000 [IU] | Freq: Every day | INTRAVENOUS | Status: AC | PRN
  Administered 2017-05-11: 500 [IU]

## 2017-05-11 MED ORDER — DIPHENHYDRAMINE HCL 50 MG/ML IJ SOLN
25.0000 mg | Freq: Once | INTRAMUSCULAR | Status: AC
  Administered 2017-05-11: 25 mg via INTRAVENOUS
  Filled 2017-05-11: qty 1

## 2017-05-11 MED ORDER — ACETAMINOPHEN 325 MG PO TABS
650.0000 mg | ORAL_TABLET | Freq: Once | ORAL | Status: AC
  Administered 2017-05-11: 650 mg via ORAL
  Filled 2017-05-11: qty 2

## 2017-05-11 NOTE — Patient Instructions (Signed)

## 2017-05-12 LAB — TYPE AND SCREEN
ABO/RH(D): O POS
Antibody Screen: NEGATIVE
Unit division: 0

## 2017-05-12 LAB — BPAM RBC
BLOOD PRODUCT EXPIRATION DATE: 201905212359
ISSUE DATE / TIME: 201904260941
Unit Type and Rh: 5100

## 2017-05-14 ENCOUNTER — Ambulatory Visit
Admission: RE | Admit: 2017-05-14 | Discharge: 2017-05-14 | Disposition: A | Discharge status: Home / Self Care | Payer: Medicare Other | Source: Ambulatory Visit | Attending: Radiation Oncology | Admitting: Radiation Oncology

## 2017-05-14 ENCOUNTER — Encounter: Payer: Self-pay | Admitting: Radiation Oncology

## 2017-05-14 ENCOUNTER — Other Ambulatory Visit: Payer: Self-pay

## 2017-05-14 VITALS — BP 154/93 | HR 74 | Temp 97.1°F | Resp 18 | Wt 199.5 lb

## 2017-05-14 DIAGNOSIS — Z79811 Long term (current) use of aromatase inhibitors: Secondary | ICD-10-CM | POA: Insufficient documentation

## 2017-05-14 DIAGNOSIS — G893 Neoplasm related pain (acute) (chronic): Secondary | ICD-10-CM | POA: Diagnosis not present

## 2017-05-14 DIAGNOSIS — C50912 Malignant neoplasm of unspecified site of left female breast: Secondary | ICD-10-CM | POA: Diagnosis not present

## 2017-05-14 DIAGNOSIS — Z170 Estrogen receptor positive status [ER+]: Secondary | ICD-10-CM | POA: Insufficient documentation

## 2017-05-14 DIAGNOSIS — C7951 Secondary malignant neoplasm of bone: Secondary | ICD-10-CM | POA: Diagnosis not present

## 2017-05-14 IMAGING — NM NM BONE WHOLE BODY
4 series · 24 of 24 positions shown · non-contrast
Comparison: None.

CLINICAL DATA: Breast carcinoma

EXAM:
NUCLEAR MEDICINE WHOLE BODY BONE SCAN
TECHNIQUE: Whole body anterior and posterior images were obtained approximately
3 hours after intravenous injection of radiopharmaceutical.
RADIOPHARMACEUTICALS:  19.885 mCi 3echnetium-55m MDP IV

[Series 1000: 3 hr wholebody · 2.40mm/px · 2 of 2 frames shown]
[frame 1/2]
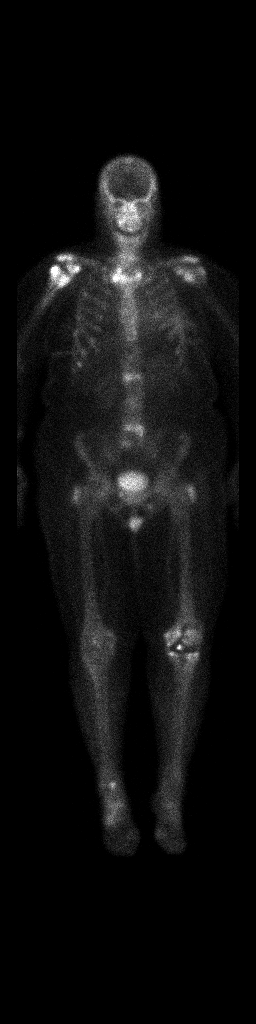
[frame 2/2]
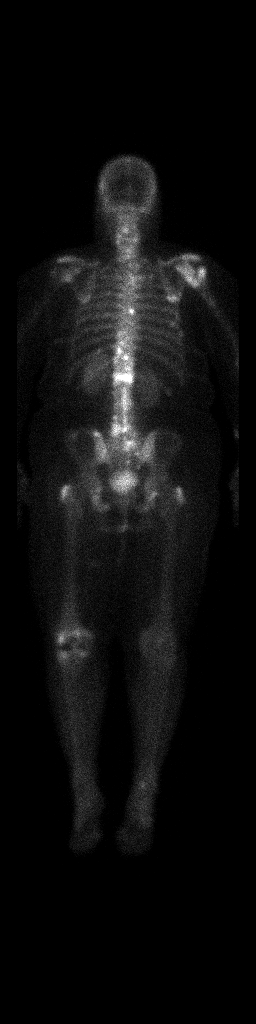

[Series 1000: statics (reformatted series) · 2.40mm/px · 5 acquisitions, 10 frames shown]
[im 1/5]
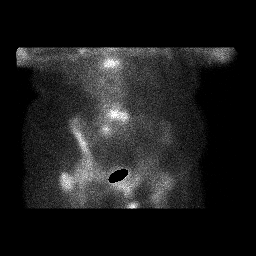
[im 1/5]
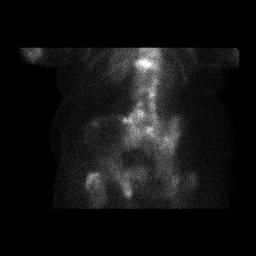
[im 2/5]
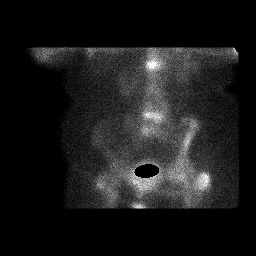
[im 2/5]
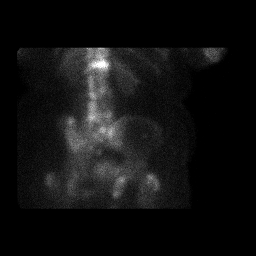
[im 3/5]
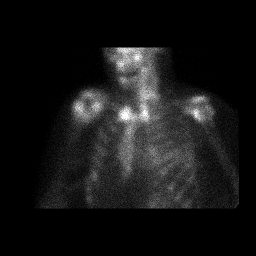
[im 3/5]
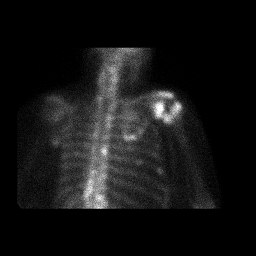
[im 4/5]
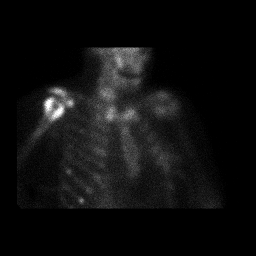
[im 4/5]
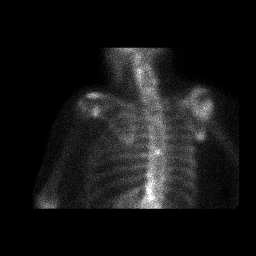
[im 5/5]
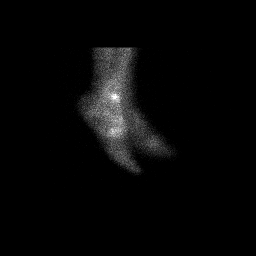
[im 5/5]
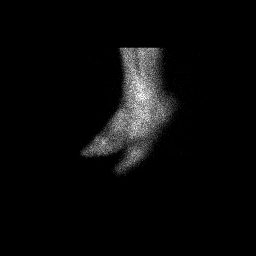

[Series 1000: statics · 2.40mm/px · 5 acquisitions, 10 frames shown]
[im 1/5]
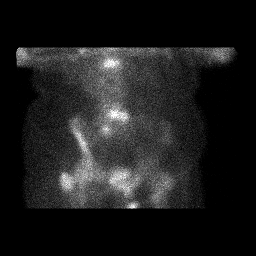
[im 1/5]
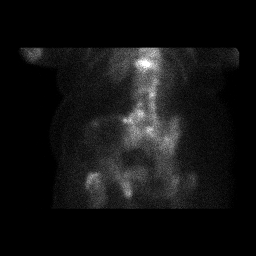
[im 2/5]
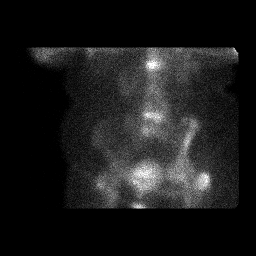
[im 2/5]
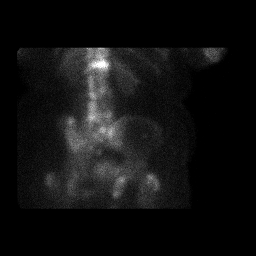
[im 3/5]
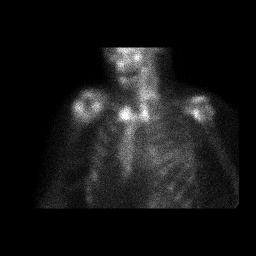
[im 3/5]
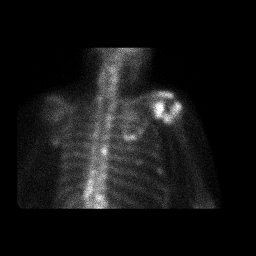
[im 4/5]
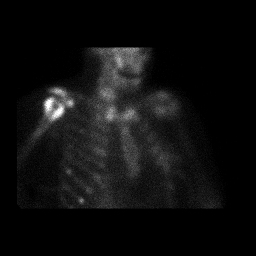
[im 4/5]
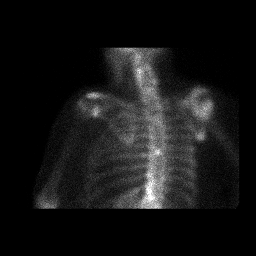
[im 5/5]
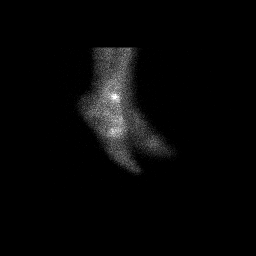
[im 5/5]
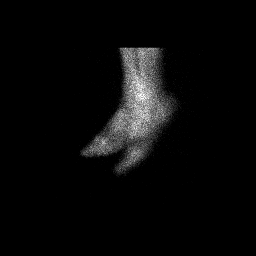

[Series 1000: 3 hr wholebody (reformatted series) · 2.40mm/px · 2 of 2 frames shown]
[frame 1/2]
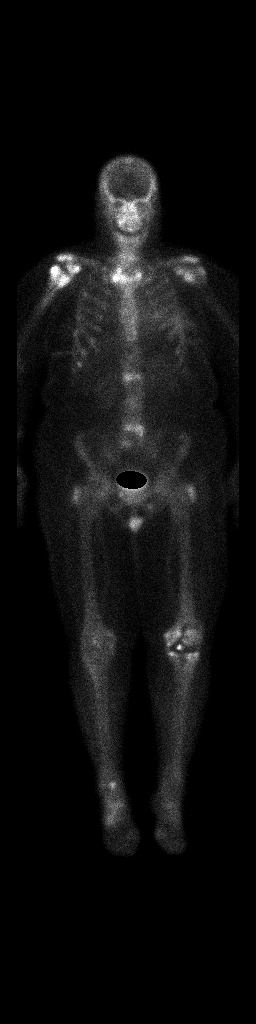
[frame 2/2]
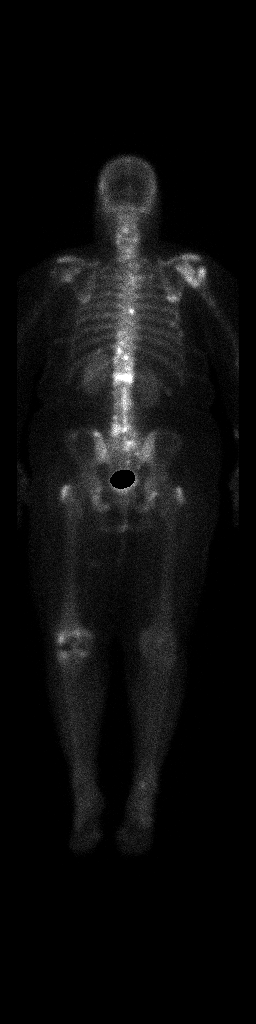

[24 of 24 positions shown; findings below may reference images not displayed]

FINDINGS: There is abnormal radiotracer uptake in both shoulders, more on the
right than on the left. Abnormal uptake in each proximal humerus is
suspicious for metastatic disease, likely superimposed arthropathy.
There are foci of increased radiotracer uptake in several
right-sided lateral ribs, primarily involving the right seventh and
eighth ribs laterally. There is a small focus of increased uptake in
the T7 vertebra as well as foci of increased uptake at T10, T11, and
T12. More intense abnormal uptake is noted in the L1 and L5
vertebral bodies. They lesser degree of abnormal uptake is noted in
the L4 vertebral body on the left. There is increased uptake focally
in the superior left sacroiliac joint region as well as in the right
upper sacral ala. There is abnormal uptake each greater trochanter
of the proximal femurs bilaterally. There is abnormal uptake in each
lateral ischium.

Patient has had a total knee replacement on the left. Increased
uptake in the right ankle is most likely of arthropathic etiology.

Kidneys are noted in the flank positions bilaterally.
IMPRESSION: Multifocal abnormal uptake consistent with metastatic disease.

## 2017-05-14 NOTE — Progress Notes (Signed)
Radiation Oncology Follow up Note Old patient new area bone metastases  Name: Amanda [name]   Date:   05/14/2017 MRN:  [id number] DOB: 1950/01/25    This 67 y.o. female presents to the clinic today for palliative radiation therapy to her spine in patient with known stage IV breast cancer.  REFERRING PROVIDER: Sofie Hartigan, MD  HPI: patient is well known to our department having received 2 courses of palliative radiation therapy 1 to her lower lumbar spine and 1 to her thoracic spine for stage IV metastatic breast cancer..she has been treated with medical oncology currently on Zometa. She's been having lower back pain and recent MRI scan demonstrated extensive metastatic disease in the spine as well as the sacrum. There appears to be marked destruction above her previous treated lumbar spine field as well as below that field and the sacrum. She is ambulating well.she is currently on Femara.  COMPLICATIONS OF TREATMENT: none  FOLLOW UP COMPLIANCE: keeps appointments   PHYSICAL EXAM:  BP (!) 154/93   Pulse 74   Temp (!) 97.1 F (36.2 C)   Resp 18   Wt 199 lb 8.3 oz (90.5 kg)   BMI 34.25 kg/m  Motor sensory and DTR levels are equal and symmetric in lower extremities. No focal neurologic deficit is appreciated. Deep palpation of her spine does not elicit pain. Well-developed well-nourished patient in NAD. HEENT reveals PERLA, EOMI, discs not visualized.  Oral cavity is clear. No oral mucosal lesions are identified. Neck is clear without evidence of cervical or supraclavicular adenopathy. Lungs are clear to A&P. Cardiac examination is essentially unremarkable with regular rate and rhythm without murmur rub or thrill. Abdomen is benign with no organomegaly or masses noted. Motor sensory and DTR levels are equal and symmetric in the upper and lower extremities. Cranial nerves II through XII are grossly intact. Proprioception is intact. No peripheral adenopathy or edema is identified. No  motor or sensory levels are noted. Crude visual fields are within normal range.  RADIOLOGY RESULTS: mRI scans reviewed  PLAN: t this time I to go ahead with palliative radiation therapy to her upper lumbar spine I will try to match the area between her prior thoracic field and lumbar field to best of her ability. Would plan on delivering 3000 cGy in 10 fractions. There is always room for treatment to her sacrum should that become more symptomatic. Risks and benefits of treatment including possible diarrhea fatigue alteration of blood counts skin reaction all were discussed in detail. She seems to comprehend my treatment plan well. I've personally set up and ordered CT simulation for later this week.  I would like to take this opportunity to thank you for allowing me to participate in the care of your patient.Noreene Filbert, MD

## 2017-05-18 ENCOUNTER — Ambulatory Visit
Admission: RE | Admit: 2017-05-18 | Discharge: 2017-05-18 | Disposition: A | Discharge status: Home / Self Care | Payer: Medicare Other | Source: Ambulatory Visit | Attending: Radiation Oncology | Admitting: Radiation Oncology

## 2017-05-18 DIAGNOSIS — Z170 Estrogen receptor positive status [ER+]: Secondary | ICD-10-CM | POA: Insufficient documentation

## 2017-05-18 DIAGNOSIS — C50912 Malignant neoplasm of unspecified site of left female breast: Secondary | ICD-10-CM | POA: Insufficient documentation

## 2017-05-18 DIAGNOSIS — C7951 Secondary malignant neoplasm of bone: Secondary | ICD-10-CM | POA: Insufficient documentation

## 2017-05-18 DIAGNOSIS — Z79811 Long term (current) use of aromatase inhibitors: Secondary | ICD-10-CM | POA: Diagnosis not present

## 2017-05-18 DIAGNOSIS — Z510 Encounter for antineoplastic radiation therapy: Secondary | ICD-10-CM | POA: Insufficient documentation

## 2017-05-21 DIAGNOSIS — Z170 Estrogen receptor positive status [ER+]: Secondary | ICD-10-CM | POA: Diagnosis not present

## 2017-05-21 DIAGNOSIS — Z510 Encounter for antineoplastic radiation therapy: Secondary | ICD-10-CM | POA: Diagnosis not present

## 2017-05-21 DIAGNOSIS — C7951 Secondary malignant neoplasm of bone: Secondary | ICD-10-CM | POA: Diagnosis not present

## 2017-05-21 DIAGNOSIS — Z79811 Long term (current) use of aromatase inhibitors: Secondary | ICD-10-CM | POA: Diagnosis not present

## 2017-05-21 DIAGNOSIS — C50912 Malignant neoplasm of unspecified site of left female breast: Secondary | ICD-10-CM | POA: Diagnosis not present

## 2017-05-23 ENCOUNTER — Ambulatory Visit
Admission: RE | Admit: 2017-05-23 | Discharge: 2017-05-23 | Disposition: A | Discharge status: Home / Self Care | Payer: Medicare Other | Source: Ambulatory Visit | Attending: Radiation Oncology | Admitting: Radiation Oncology

## 2017-05-23 DIAGNOSIS — Z170 Estrogen receptor positive status [ER+]: Secondary | ICD-10-CM | POA: Diagnosis not present

## 2017-05-23 DIAGNOSIS — C50912 Malignant neoplasm of unspecified site of left female breast: Secondary | ICD-10-CM | POA: Diagnosis not present

## 2017-05-23 DIAGNOSIS — Z79811 Long term (current) use of aromatase inhibitors: Secondary | ICD-10-CM | POA: Diagnosis not present

## 2017-05-23 DIAGNOSIS — Z510 Encounter for antineoplastic radiation therapy: Secondary | ICD-10-CM | POA: Diagnosis not present

## 2017-05-23 DIAGNOSIS — C7951 Secondary malignant neoplasm of bone: Secondary | ICD-10-CM | POA: Diagnosis not present

## 2017-05-24 ENCOUNTER — Ambulatory Visit
Admission: RE | Admit: 2017-05-24 | Discharge: 2017-05-24 | Disposition: A | Discharge status: Home / Self Care | Payer: Medicare Other | Source: Ambulatory Visit | Attending: Radiation Oncology | Admitting: Radiation Oncology

## 2017-05-24 DIAGNOSIS — H3561 Retinal hemorrhage, right eye: Secondary | ICD-10-CM | POA: Diagnosis not present

## 2017-05-24 DIAGNOSIS — Z170 Estrogen receptor positive status [ER+]: Secondary | ICD-10-CM | POA: Diagnosis not present

## 2017-05-24 DIAGNOSIS — C50912 Malignant neoplasm of unspecified site of left female breast: Secondary | ICD-10-CM | POA: Diagnosis not present

## 2017-05-24 DIAGNOSIS — Z510 Encounter for antineoplastic radiation therapy: Secondary | ICD-10-CM | POA: Diagnosis not present

## 2017-05-24 DIAGNOSIS — Z79811 Long term (current) use of aromatase inhibitors: Secondary | ICD-10-CM | POA: Diagnosis not present

## 2017-05-24 DIAGNOSIS — C7951 Secondary malignant neoplasm of bone: Secondary | ICD-10-CM | POA: Diagnosis not present

## 2017-05-25 ENCOUNTER — Ambulatory Visit
Admission: RE | Admit: 2017-05-25 | Discharge: 2017-05-25 | Disposition: A | Discharge status: Home / Self Care | Payer: Medicare Other | Source: Ambulatory Visit | Attending: Radiation Oncology | Admitting: Radiation Oncology

## 2017-05-25 DIAGNOSIS — Z510 Encounter for antineoplastic radiation therapy: Secondary | ICD-10-CM | POA: Diagnosis not present

## 2017-05-25 DIAGNOSIS — Z170 Estrogen receptor positive status [ER+]: Secondary | ICD-10-CM | POA: Diagnosis not present

## 2017-05-25 DIAGNOSIS — C50912 Malignant neoplasm of unspecified site of left female breast: Secondary | ICD-10-CM | POA: Diagnosis not present

## 2017-05-25 DIAGNOSIS — Z79811 Long term (current) use of aromatase inhibitors: Secondary | ICD-10-CM | POA: Diagnosis not present

## 2017-05-25 DIAGNOSIS — C7951 Secondary malignant neoplasm of bone: Secondary | ICD-10-CM | POA: Diagnosis not present

## 2017-05-28 ENCOUNTER — Ambulatory Visit
Admission: RE | Admit: 2017-05-28 | Discharge: 2017-05-28 | Disposition: A | Discharge status: Home / Self Care | Payer: Medicare Other | Source: Ambulatory Visit | Attending: Radiation Oncology | Admitting: Radiation Oncology

## 2017-05-28 DIAGNOSIS — C50912 Malignant neoplasm of unspecified site of left female breast: Secondary | ICD-10-CM | POA: Diagnosis not present

## 2017-05-28 DIAGNOSIS — Z170 Estrogen receptor positive status [ER+]: Secondary | ICD-10-CM | POA: Diagnosis not present

## 2017-05-28 DIAGNOSIS — C7951 Secondary malignant neoplasm of bone: Secondary | ICD-10-CM | POA: Diagnosis not present

## 2017-05-28 DIAGNOSIS — Z79811 Long term (current) use of aromatase inhibitors: Secondary | ICD-10-CM | POA: Diagnosis not present

## 2017-05-28 DIAGNOSIS — Z510 Encounter for antineoplastic radiation therapy: Secondary | ICD-10-CM | POA: Diagnosis not present

## 2017-05-29 ENCOUNTER — Ambulatory Visit
Admission: RE | Admit: 2017-05-29 | Discharge: 2017-05-29 | Disposition: A | Discharge status: Home / Self Care | Payer: Medicare Other | Source: Ambulatory Visit | Attending: Radiation Oncology | Admitting: Radiation Oncology

## 2017-05-29 DIAGNOSIS — C50912 Malignant neoplasm of unspecified site of left female breast: Secondary | ICD-10-CM | POA: Diagnosis not present

## 2017-05-29 DIAGNOSIS — Z510 Encounter for antineoplastic radiation therapy: Secondary | ICD-10-CM | POA: Diagnosis not present

## 2017-05-29 DIAGNOSIS — C7951 Secondary malignant neoplasm of bone: Secondary | ICD-10-CM | POA: Diagnosis not present

## 2017-05-29 DIAGNOSIS — Z170 Estrogen receptor positive status [ER+]: Secondary | ICD-10-CM | POA: Diagnosis not present

## 2017-05-29 DIAGNOSIS — Z79811 Long term (current) use of aromatase inhibitors: Secondary | ICD-10-CM | POA: Diagnosis not present

## 2017-05-30 ENCOUNTER — Ambulatory Visit
Admission: RE | Admit: 2017-05-30 | Discharge: 2017-05-30 | Disposition: A | Discharge status: Home / Self Care | Payer: Medicare Other | Source: Ambulatory Visit | Attending: Radiation Oncology | Admitting: Radiation Oncology

## 2017-05-30 DIAGNOSIS — C50912 Malignant neoplasm of unspecified site of left female breast: Secondary | ICD-10-CM | POA: Diagnosis not present

## 2017-05-30 DIAGNOSIS — Z170 Estrogen receptor positive status [ER+]: Secondary | ICD-10-CM | POA: Diagnosis not present

## 2017-05-30 DIAGNOSIS — C7951 Secondary malignant neoplasm of bone: Secondary | ICD-10-CM | POA: Diagnosis not present

## 2017-05-30 DIAGNOSIS — Z510 Encounter for antineoplastic radiation therapy: Secondary | ICD-10-CM | POA: Diagnosis not present

## 2017-05-30 DIAGNOSIS — Z79811 Long term (current) use of aromatase inhibitors: Secondary | ICD-10-CM | POA: Diagnosis not present

## 2017-05-31 ENCOUNTER — Ambulatory Visit
Admission: RE | Admit: 2017-05-31 | Discharge: 2017-05-31 | Disposition: A | Discharge status: Home / Self Care | Payer: Medicare Other | Source: Ambulatory Visit | Attending: Radiation Oncology | Admitting: Radiation Oncology

## 2017-05-31 DIAGNOSIS — Z79811 Long term (current) use of aromatase inhibitors: Secondary | ICD-10-CM | POA: Diagnosis not present

## 2017-05-31 DIAGNOSIS — Z170 Estrogen receptor positive status [ER+]: Secondary | ICD-10-CM | POA: Diagnosis not present

## 2017-05-31 DIAGNOSIS — C7951 Secondary malignant neoplasm of bone: Secondary | ICD-10-CM | POA: Diagnosis not present

## 2017-05-31 DIAGNOSIS — Z510 Encounter for antineoplastic radiation therapy: Secondary | ICD-10-CM | POA: Diagnosis not present

## 2017-05-31 DIAGNOSIS — C50912 Malignant neoplasm of unspecified site of left female breast: Secondary | ICD-10-CM | POA: Diagnosis not present

## 2017-06-01 ENCOUNTER — Ambulatory Visit
Admission: RE | Admit: 2017-06-01 | Discharge: 2017-06-01 | Disposition: A | Discharge status: Home / Self Care | Payer: Medicare Other | Source: Ambulatory Visit | Attending: Radiation Oncology | Admitting: Radiation Oncology

## 2017-06-01 DIAGNOSIS — C50912 Malignant neoplasm of unspecified site of left female breast: Secondary | ICD-10-CM | POA: Diagnosis not present

## 2017-06-01 DIAGNOSIS — Z79811 Long term (current) use of aromatase inhibitors: Secondary | ICD-10-CM | POA: Diagnosis not present

## 2017-06-01 DIAGNOSIS — Z170 Estrogen receptor positive status [ER+]: Secondary | ICD-10-CM | POA: Diagnosis not present

## 2017-06-01 DIAGNOSIS — C7951 Secondary malignant neoplasm of bone: Secondary | ICD-10-CM | POA: Diagnosis not present

## 2017-06-01 DIAGNOSIS — Z510 Encounter for antineoplastic radiation therapy: Secondary | ICD-10-CM | POA: Diagnosis not present

## 2017-06-03 NOTE — Progress Notes (Signed)
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 Now with biopsy-proven stage IV disease.  INTERVAL HISTORY: Patient returns to clinic today for further evaluation, consideration of reinitiation of Ibrance and continuation of Zometa.  She continues to have chronic back pain, but otherwise feels well.  She does not complain of weakness or fatigue today. She continues to have neuropathy in her fingertips and feet which is unchanged. She has no other neurologic complaints. She denies any other pain. She denies any recent fevers or illnesses.  She denies any chest pain, cough, hemoptysis, or shortness of breath. She denies any nausea, vomiting, constipation, or diarrhea.  She has no urinary complaints.  Patient offers no further specific complaints today.  REVIEW OF SYSTEMS:   Review of Systems  Constitutional: Positive for malaise/fatigue. Negative for fever and weight loss.  Eyes: Negative.  Negative for blurred vision, double vision and pain.  Respiratory: Negative.  Negative for cough and shortness of breath.   Cardiovascular: Negative.  Negative for chest pain and leg swelling.  Gastrointestinal: Negative.  Negative for abdominal pain.  Genitourinary: Negative.   Musculoskeletal: Positive for back pain. Negative for falls.  Skin: Negative.  Negative for rash.  Neurological: Positive for tingling, sensory change and weakness. Negative for focal weakness.  Endo/Heme/Allergies: Negative.   Psychiatric/Behavioral: Negative.  Negative for memory loss. The patient is not nervous/anxious.     As per HPI. Otherwise, a complete review of systems is  negative.  PAST MEDICAL HISTORY: Past Medical History:  Diagnosis Date  . Back pain    occasionally  . Breast cancer ([place]) 2009   left  . Depression    takes Paxil and Wellbutrin daily  . Diabetes ([place])    takes Metformin daily  . Genetic testing 02/03/2017   Multi-Cancer panel (83 genes) @ [facility] - No pathogenic mutations detected  . GERD (gastroesophageal reflux disease)   . History of bronchitis 2 yrs ago  . Hyperlipidemia    takes Atorvastatin daily  . Hypertension    takes Losartan-HCTZ daily  . Insomnia    takes Ambien nightly  . Insomnia    takes gabapentin nightly  . Joint pain   . Migraine   . Mood swings   . Osteoarthritis of knee   . Personal history of chemotherapy 12/05/2016   Mets from Breast Cancer  . Personal history of radiation therapy 11/2016  . PONV (postoperative nausea and vomiting)   . Seasonal allergies    takes Allegra daily    PAST SURGICAL HISTORY: Past Surgical History:  Procedure Laterality Date  . BREAST BIOPSY  2009  . cataract surgery Bilateral   . COLONOSCOPY    . JOINT REPLACEMENT Left 2014   knee  . KNEE ARTHROSCOPY Left    x 5  . MASTECTOMY Left   . port a cath placed    . PORTACATH PLACEMENT N/A 09/17/2015   Procedure: INSERTION PORT-A-CATH;  Surgeon: Jules Husbands, MD;  Location: [facility];  Service: General;  Laterality: N/A;  . TOTAL SHOULDER ARTHROPLASTY Right 06/03/2015   Procedure: TOTAL SHOULDER ARTHROPLASTY;  Surgeon: Tania Ade, MD;  Location: [facility];  Service: Orthopedics;  Laterality: Right;  Right total shoulder arthroplasty  .  TOTAL SHOULDER REPLACEMENT Right 06/03/2015  . TUBAL LIGATION      FAMILY HISTORY: Father with non-Hodgkin's lymphoma, 2 paternal aunts with breast cancer.     ADVANCED DIRECTIVES:    HEALTH MAINTENANCE: Social History   Tobacco Use  . Smoking status: Never Smoker  . Smokeless tobacco: Never Used  Substance Use Topics  . Alcohol use: Yes    Alcohol/week: 0.0 oz    Comment:  occasionally wine  . Drug use: Yes    Types: Marijuana    Comment: cannabis with no extra     Colonoscopy:  PAP:  Bone density:  Lipid panel:  Allergies  Allergen Reactions  . Ace Inhibitors Cough  . Latex Itching  . Morphine And Related Itching    Caused her to itch terribly. Would prefer if given to take with a benadryl  . Other Itching    Freeze spray. Patient stated that she may be able to use it now because she doesn't use it as often.  Marland Kitchen Penicillins Rash    Has patient had a PCN reaction causing immediate rash, facial/tongue/throat swelling, SOB or lightheadedness with hypotension: No Has patient had a PCN reaction causing severe rash involving mucus membranes or skin necrosis: No Has patient had a PCN reaction that required hospitalization No Has patient had a PCN reaction occurring within the last 10 years: No If all of the above answers are "NO", then may proceed with Cephalosporin use.    Current Outpatient Medications  Medication Sig Dispense Refill  . atorvastatin (LIPITOR) 10 MG tablet Take 10 mg by mouth daily at 6 PM.     . buPROPion (WELLBUTRIN XL) 150 MG 24 hr tablet Take 150 mg by mouth every morning.     . Calcium-Magnesium-Vitamin D (CALCIUM 1200+D3 PO) Take 1 Dose by mouth daily.    . cyanocobalamin 2000 MCG tablet Take 2,000 mcg by mouth daily.    . fexofenadine (ALLEGRA) 180 MG tablet Take 180 mg by mouth at bedtime.     . gabapentin (NEURONTIN) 300 MG capsule Take 300 mg by mouth. 2 pills in am,2 pills at noon and 3 pills at night    . letrozole (FEMARA) 2.5 MG tablet Take 1 tablet (2.5 mg total) by mouth daily. 15 tablet 1  . lidocaine-prilocaine (EMLA) cream Apply 1 application topically as needed. Apply to port 1-2 hours prior to chemotherapy appointment. Cover with plastic wrap. 30 g 0  . metFORMIN (GLUCOPHAGE) 850 MG tablet Take 850 mg by mouth 2 (two) times daily with a meal.     . PARoxetine (PAXIL) 40 MG tablet Take 40 mg by mouth every morning.      Marland Kitchen b complex vitamins capsule Take 1 capsule by mouth daily.    Marland Kitchen ibuprofen (ADVIL,MOTRIN) 800 MG tablet Take 1 tablet (800 mg total) every 8 (eight) hours as needed by mouth for mild pain. (Patient not taking: Reported on 04/25/2017) 30 tablet 0  . ondansetron (ZOFRAN ODT) 4 MG disintegrating tablet Take 1 tablet (4 mg total) by mouth every 8 (eight) hours as needed for nausea or vomiting. (Patient not taking: Reported on 04/25/2017) 20 tablet 0  . palbociclib (IBRANCE) 75 MG capsule Take 1 capsule (75 mg total) by mouth daily with breakfast. Take for 21 days, then 14 days off. (Patient not taking: Reported on 05/14/2017) 21 capsule 5  . Zolpidem Tartrate (AMBIEN PO) Take 10 mg by mouth.      Current Facility-Administered Medications  Medication Dose Route Frequency Provider  Last Rate Last Dose  . guaiFENesin-dextromethorphan (ROBITUSSIN DM) 100-10 MG/5ML syrup 5 mL  5 mL Oral Once Lloyd Huger, MD        OBJECTIVE: Vitals:   06/06/17 0958  BP: 132/80  Pulse: 96  Resp: 18  Temp: (!) 96.5 F (35.8 C)     Body mass index is 32.91 kg/m.    ECOG FS:0 - Asymptomatic   General: Well-developed, well-nourished, no acute distress. Eyes: Pink conjunctiva, anicteric sclera. Breasts: Left chest wall without evidence of recurrence.  Right breast and axilla without lumps or masses.  Exam deferred today. Lungs: Clear to auscultation bilaterally. Heart: Regular rate and rhythm. No rubs, murmurs, or gallops. Abdomen: Soft, nontender, nondistended. No organomegaly noted, normoactive bowel sounds. Musculoskeletal: No edema, cyanosis, or clubbing. Neuro: Alert, answering all questions appropriately. Cranial nerves grossly intact. Skin: No rashes or petechiae noted. Psych: Normal affect.  LAB RESULTS:  Lab Results  Component Value Date   NA 138 06/06/2017   K 4.3 06/06/2017   CL 108 06/06/2017   CO2 21 (L) 06/06/2017   GLUCOSE 176 (H) 06/06/2017   BUN 16 06/06/2017   CREATININE 1.16 (H)  06/06/2017   CALCIUM 9.2 06/06/2017   PROT 6.7 06/06/2017   ALBUMIN 3.9 06/06/2017   AST 57 (H) 06/06/2017   ALT 25 06/06/2017   ALKPHOS 60 06/06/2017   BILITOT 0.6 06/06/2017   GFRNONAA 48 (L) 06/06/2017   GFRAA 56 (L) 06/06/2017    Lab Results  Component Value Date   WBC 3.0 (L) 06/06/2017   NEUTROABS 1.7 06/06/2017   HGB 8.6 (L) 06/06/2017   HCT 26.6 (L) 06/06/2017   MCV 78.3 (L) 06/06/2017   PLT 249 06/06/2017     STUDIES: No results found.  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 Now with biopsy proven stage IV disease with lymph node and bone metastasis.  PLAN:    1.  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  Will reinitiate dose reduced Ibrance 75 mg for 14 days with 14 days off.  If patient tolerates this, can consider increasing treatment to 21 days. Continue daily letrozole.  Proceed with Zometa today.  Return to clinic in 4 weeks for repeat laboratory work, further evaluation, and consideration of continuation of Zometa and Ibrance.   2.  Osteopenia: Patient's bone mineral density on January 26, 2016 reported a T score of -0.9 which is considered normal. Continue calcium and vitamin D.  Zometa as above.   3.  Anemia: Improved with recent transfusion.  Monitor.   4. Peripheral neuropathy: Chronic and unchanged.  Continue gabapentin as prescribed. Neuropathy managed by neurology.  5. Back pain: MRI results as above.  Does not appear to be related to malignancy.  Monitor. 6.  Left IJ clot: Ultrasound results reviewed independently.  Patient has been instructed to discontinue Eliquis. 7.  Leukopenia: Mild, proceed with dose reduced Ibrance as above.  Approximately 30  minutes was spent in discussion of which greater than 50% was consultation.   Patient expressed understanding and was in agreement with this plan. She also understands that She can call clinic at any time with any questions, concerns, or complaints.   Breast cancer   Staging form: Breast, AJCC 7th Edition     Pathologic  stage from 08/11/2014: Stage IIIA (T0, N2a, cM0) - Signed by Lloyd Huger, MD on 08/11/2014   Lloyd Huger, MD   06/09/2017 5:23 PM

## 2017-06-04 ENCOUNTER — Ambulatory Visit
Admission: RE | Admit: 2017-06-04 | Discharge: 2017-06-04 | Disposition: A | Discharge status: Home / Self Care | Payer: Medicare Other | Source: Ambulatory Visit | Attending: Radiation Oncology | Admitting: Radiation Oncology

## 2017-06-04 DIAGNOSIS — Z170 Estrogen receptor positive status [ER+]: Secondary | ICD-10-CM | POA: Diagnosis not present

## 2017-06-04 DIAGNOSIS — Z510 Encounter for antineoplastic radiation therapy: Secondary | ICD-10-CM | POA: Diagnosis not present

## 2017-06-04 DIAGNOSIS — C50912 Malignant neoplasm of unspecified site of left female breast: Secondary | ICD-10-CM | POA: Diagnosis not present

## 2017-06-04 DIAGNOSIS — Z79811 Long term (current) use of aromatase inhibitors: Secondary | ICD-10-CM | POA: Diagnosis not present

## 2017-06-04 DIAGNOSIS — C7951 Secondary malignant neoplasm of bone: Secondary | ICD-10-CM | POA: Diagnosis not present

## 2017-06-05 ENCOUNTER — Ambulatory Visit
Admission: RE | Admit: 2017-06-05 | Discharge: 2017-06-05 | Disposition: A | Discharge status: Home / Self Care | Payer: Medicare Other | Source: Ambulatory Visit | Attending: Radiation Oncology | Admitting: Radiation Oncology

## 2017-06-05 DIAGNOSIS — Z510 Encounter for antineoplastic radiation therapy: Secondary | ICD-10-CM | POA: Diagnosis not present

## 2017-06-05 DIAGNOSIS — Z79811 Long term (current) use of aromatase inhibitors: Secondary | ICD-10-CM | POA: Diagnosis not present

## 2017-06-05 DIAGNOSIS — C50912 Malignant neoplasm of unspecified site of left female breast: Secondary | ICD-10-CM | POA: Diagnosis not present

## 2017-06-05 DIAGNOSIS — C7951 Secondary malignant neoplasm of bone: Secondary | ICD-10-CM | POA: Diagnosis not present

## 2017-06-05 DIAGNOSIS — Z170 Estrogen receptor positive status [ER+]: Secondary | ICD-10-CM | POA: Diagnosis not present

## 2017-06-06 ENCOUNTER — Ambulatory Visit
Admission: RE | Admit: 2017-06-06 | Discharge: 2017-06-06 | Disposition: A | Discharge status: Home / Self Care | Payer: Medicare Other | Source: Ambulatory Visit | Attending: Radiation Oncology | Admitting: Radiation Oncology

## 2017-06-06 ENCOUNTER — Inpatient Hospital Stay: Payer: Medicare Other

## 2017-06-06 ENCOUNTER — Other Ambulatory Visit: Payer: Self-pay

## 2017-06-06 ENCOUNTER — Inpatient Hospital Stay: Payer: Medicare Other | Attending: Oncology

## 2017-06-06 ENCOUNTER — Inpatient Hospital Stay (HOSPITAL_BASED_OUTPATIENT_CLINIC_OR_DEPARTMENT_OTHER): Payer: Medicare Other | Admitting: Oncology

## 2017-06-06 ENCOUNTER — Encounter: Payer: Self-pay | Admitting: Oncology

## 2017-06-06 VITALS — BP 132/80 | HR 96 | Temp 96.5°F | Resp 18 | Wt 191.7 lb

## 2017-06-06 DIAGNOSIS — D649 Anemia, unspecified: Secondary | ICD-10-CM | POA: Diagnosis not present

## 2017-06-06 DIAGNOSIS — I82C12 Acute embolism and thrombosis of left internal jugular vein: Secondary | ICD-10-CM | POA: Insufficient documentation

## 2017-06-06 DIAGNOSIS — Z79811 Long term (current) use of aromatase inhibitors: Secondary | ICD-10-CM | POA: Diagnosis not present

## 2017-06-06 DIAGNOSIS — G8929 Other chronic pain: Secondary | ICD-10-CM | POA: Insufficient documentation

## 2017-06-06 DIAGNOSIS — Z510 Encounter for antineoplastic radiation therapy: Secondary | ICD-10-CM | POA: Diagnosis not present

## 2017-06-06 DIAGNOSIS — M545 Low back pain: Secondary | ICD-10-CM

## 2017-06-06 DIAGNOSIS — Z170 Estrogen receptor positive status [ER+]: Secondary | ICD-10-CM

## 2017-06-06 DIAGNOSIS — C7951 Secondary malignant neoplasm of bone: Secondary | ICD-10-CM

## 2017-06-06 DIAGNOSIS — G620 Drug-induced polyneuropathy: Secondary | ICD-10-CM | POA: Insufficient documentation

## 2017-06-06 DIAGNOSIS — D72819 Decreased white blood cell count, unspecified: Secondary | ICD-10-CM | POA: Insufficient documentation

## 2017-06-06 DIAGNOSIS — C50912 Malignant neoplasm of unspecified site of left female breast: Secondary | ICD-10-CM

## 2017-06-06 DIAGNOSIS — M8580 Other specified disorders of bone density and structure, unspecified site: Secondary | ICD-10-CM | POA: Diagnosis not present

## 2017-06-06 DIAGNOSIS — C50919 Malignant neoplasm of unspecified site of unspecified female breast: Secondary | ICD-10-CM

## 2017-06-06 DIAGNOSIS — C779 Secondary and unspecified malignant neoplasm of lymph node, unspecified: Secondary | ICD-10-CM | POA: Insufficient documentation

## 2017-06-06 LAB — COMPREHENSIVE METABOLIC PANEL
ALT: 25 U/L (ref 14–54)
AST: 57 U/L — AB (ref 15–41)
Albumin: 3.9 g/dL (ref 3.5–5.0)
Alkaline Phosphatase: 60 U/L (ref 38–126)
Anion gap: 9 (ref 5–15)
BILIRUBIN TOTAL: 0.6 mg/dL (ref 0.3–1.2)
BUN: 16 mg/dL (ref 6–20)
CO2: 21 mmol/L — ABNORMAL LOW (ref 22–32)
Calcium: 9.2 mg/dL (ref 8.9–10.3)
Chloride: 108 mmol/L (ref 101–111)
Creatinine, Ser: 1.16 mg/dL — ABNORMAL HIGH (ref 0.44–1.00)
GFR calc Af Amer: 56 mL/min — ABNORMAL LOW (ref >60)
GFR, EST NON AFRICAN AMERICAN: 48 mL/min — AB (ref >60)
Glucose, Bld: 176 mg/dL — ABNORMAL HIGH (ref 65–99)
POTASSIUM: 4.3 mmol/L (ref 3.5–5.1)
Sodium: 138 mmol/L (ref 135–145)
TOTAL PROTEIN: 6.7 g/dL (ref 6.5–8.1)

## 2017-06-06 LAB — CBC WITH DIFFERENTIAL/PLATELET
Basophils Absolute: 0 10*3/uL (ref 0–0.1)
Basophils Relative: 1 %
EOS ABS: 0.3 10*3/uL (ref 0–0.7)
EOS PCT: 9 %
HCT: 26.6 % — ABNORMAL LOW (ref 35.0–47.0)
Hemoglobin: 8.6 g/dL — ABNORMAL LOW (ref 12.0–16.0)
LYMPHS PCT: 16 %
Lymphs Abs: 0.5 10*3/uL — ABNORMAL LOW (ref 1.0–3.6)
MCH: 25.3 pg — ABNORMAL LOW (ref 26.0–34.0)
MCHC: 32.3 g/dL (ref 32.0–36.0)
MCV: 78.3 fL — ABNORMAL LOW (ref 80.0–100.0)
Monocytes Absolute: 0.5 10*3/uL (ref 0.2–0.9)
Monocytes Relative: 17 %
Neutro Abs: 1.7 10*3/uL (ref 1.4–6.5)
Neutrophils Relative %: 57 %
PLATELETS: 249 10*3/uL (ref 150–440)
RBC: 3.4 MIL/uL — ABNORMAL LOW (ref 3.80–5.20)
RDW: 22.7 % — ABNORMAL HIGH (ref 11.5–14.5)
WBC: 3 10*3/uL — AB (ref 3.6–11.0)

## 2017-06-06 MED ORDER — SODIUM CHLORIDE 0.9 % IV SOLN
Freq: Once | INTRAVENOUS | Status: AC
  Administered 2017-06-06: 11:00:00 via INTRAVENOUS
  Filled 2017-06-06: qty 1000

## 2017-06-06 MED ORDER — SODIUM CHLORIDE 0.9% FLUSH
10.0000 mL | Freq: Once | INTRAVENOUS | Status: AC
  Administered 2017-06-06: 10 mL via INTRAVENOUS
  Filled 2017-06-06: qty 10

## 2017-06-06 MED ORDER — HEPARIN SOD (PORK) LOCK FLUSH 100 UNIT/ML IV SOLN
500.0000 [IU] | Freq: Once | INTRAVENOUS | Status: AC
  Administered 2017-06-06: 500 [IU] via INTRAVENOUS
  Filled 2017-06-06: qty 5

## 2017-06-06 MED ORDER — ZOLEDRONIC ACID 4 MG/100ML IV SOLN
4.0000 mg | Freq: Once | INTRAVENOUS | Status: AC
  Administered 2017-06-06: 4 mg via INTRAVENOUS
  Filled 2017-06-06: qty 100

## 2017-06-06 NOTE — Progress Notes (Signed)
Here for follow up" im doing all right "per pt

## 2017-06-13 NOTE — Therapy (Signed)
[place], [place], [address] Phone: [phone]   Fax:  [phone]  Physical Therapy Treatment  Patient Details  Name: Amanda [name] MRN: [id number] Date of Birth: 1950-10-13 Referring Provider: Lloyd Huger   Encounter Date: 05/18/2016    Past Medical History:  Diagnosis Date  . Back pain    occasionally  . Breast cancer ([place]) 2009   left  . Depression    takes Paxil and Wellbutrin daily  . Diabetes ([place])    takes Metformin daily  . Genetic testing 02/03/2017   Multi-Cancer panel (83 genes) @ [facility] - No pathogenic mutations detected  . GERD (gastroesophageal reflux disease)   . History of bronchitis 2 yrs ago  . Hyperlipidemia    takes Atorvastatin daily  . Hypertension    takes Losartan-HCTZ daily  . Insomnia    takes Ambien nightly  . Insomnia    takes gabapentin nightly  . Joint pain   . Migraine   . Mood swings   . Osteoarthritis of knee   . Personal history of chemotherapy 12/05/2016   Mets from Breast Cancer  . Personal history of radiation therapy 11/2016  . PONV (postoperative nausea and vomiting)   . Seasonal allergies    takes Allegra daily    Past Surgical History:  Procedure Laterality Date  . BREAST BIOPSY  2009  . cataract surgery Bilateral   . COLONOSCOPY    . JOINT REPLACEMENT Left 2014   knee  . KNEE ARTHROSCOPY Left    x 5  . MASTECTOMY Left   . port a cath placed    . PORTACATH PLACEMENT N/A 09/17/2015   Procedure: INSERTION PORT-A-CATH;  Surgeon: Jules Husbands, MD;  Location: [facility];  Service: General;  Laterality: N/A;  . TOTAL SHOULDER ARTHROPLASTY Right 06/03/2015   Procedure: TOTAL SHOULDER ARTHROPLASTY;  Surgeon: Tania Ade, MD;  Location: [facility];  Service: Orthopedics;  Laterality: Right;  Right total shoulder arthroplasty  . TOTAL SHOULDER REPLACEMENT Right 06/03/2015  . TUBAL LIGATION      There were no vitals filed for this  visit.                                 PT Long Term Goals - 07/11/16 1000      PT LONG TERM GOAL #1   Title  Patient will be independent in home exercise program to improve strength/mobility for better functional independence with ADLs.    Time  8    Period  Weeks    Status  On-going      PT LONG TERM GOAL #2   Title  Patient will increase BLE gross strength to 4+/5 as to improve functional strength for independent gait, increased standing tolerance and increased ADL ability.    Baseline  -4/5 left shoulder abd and left shoulder flex    Time  8    Period  Weeks    Status  On-going      PT LONG TERM GOAL #3   Title  Patient will report a worst pain of 2/10 on VAS in  left shouder           to improve tolerance with ADLs and reduced symptoms with activities.     Baseline  4/10 left shoulder    Time  8    Period  Weeks    Status  On-going  PT LONG TERM GOAL #4   Title  Patient will decrease Quick DASH score by > 8 points demonstrating reduced self-reported upper extremity disability.    Baseline  13.63 % quick dahs    Time  8    Period  Weeks    Status  On-going        Long Term Clinic Goals - 05/18/16 1215      CC Long Term Goal  #1   Title  Pt. will report at least 60% decrease in left upper trap/shoulder area pain.    Baseline  Pt states she has decreased by at least 60 % She still has pain with some activity     Status  Achieved      CC Long Term Goal  #2   Title  Patient will be knowledgeable about self-care techniques and tools available for managing her mild left UE lymphedema.    Status  Achieved      CC Long Term Goal  #3   Title  left shoulder active flexion at least 150 degrees    Baseline  153 on  05/09/2016    Status  Achieved      CC Long Term Goal  #4   Title  left shoulder active abduction at least 155 degrees    Baseline  155 on 05/09/2016    Status  Achieved           Patient will benefit from skilled  therapeutic intervention in order to improve the following deficits and impairments:  Pain, Increased fascial restricitons, Decreased range of motion, Increased edema  Visit Diagnosis: Stiffness of left shoulder, not elsewhere classified  Disorder of the skin and subcutaneous tissue related to radiation, unspecified  Other symptoms and signs involving the musculoskeletal system  Postmastectomy lymphedema  Left shoulder pain, unspecified chronicity     Problem List Patient Active Problem List   Diagnosis Date Noted  . Genetic testing 02/03/2017  . Contusion of knee 12/06/2016  . Dislocation of shoulder joint 12/06/2016  . Shoulder joint pain 12/06/2016  . Localized, primary osteoarthritis 12/06/2016  . Osteoarthritis of knee 12/06/2016  . Burning sensation 11/13/2016  . Numbness and tingling 11/13/2016  . Malignant neoplasm of left breast in female, estrogen receptor positive ([place]) 10/05/2016  . Carcinoma of left breast, stage 4 ([place]) 04/06/2016  . Chemotherapy-induced neuropathy ([place]) 03/07/2016  . Metastatic breast cancer ([place]) 09/24/2015  . 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 Personal history of breast cancer 08/23/2015  . 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 Degenerative arthritis of right shoulder region 01/13/2015  . Cancer of left female breast  ([place]) 07/27/2014  . 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 Rotator cuff tear 06/10/2014  . Allergic rhinitis, seasonal 09/16/2013  . Depression 08/10/2013  . Diabetes mellitus type 2, uncomplicated ([place]) 68/12/7515  . Hyperlipidemia, unspecified 08/10/2013  . BP (high blood pressure) 08/10/2013  . 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  Patient goals were  not met. Patient is being discharged due to meeting the stated rehab goals.  ?????     Maudry Diego, PT 06/13/17 9:56 AM

## 2017-07-01 NOTE — Progress Notes (Signed)
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 Now with biopsy-proven stage IV disease.  INTERVAL HISTORY: Patient returns to clinic today for further evaluation, assess her toleration of Ibrance, and continuation of Zometa.  She continues to have chronic back pain, but otherwise feels well.  She has noted increased fatigue over the past several weeks.  She continues to have neuropathy in her fingertips and feet which is unchanged. She has no other neurologic complaints. She denies any other pain. She denies any recent fevers or illnesses.  She denies any chest pain, cough, hemoptysis, or shortness of breath. She denies any nausea, vomiting, constipation, or diarrhea.  She has no urinary complaints.  Patient offers no further specific complaints today.  REVIEW OF SYSTEMS:   Review of Systems  Constitutional: Positive for malaise/fatigue. Negative for fever and weight loss.  Eyes: Negative.  Negative for blurred vision, double vision and pain.  Respiratory: Negative.  Negative for cough and shortness of breath.   Cardiovascular: Negative.  Negative for chest pain and leg swelling.  Gastrointestinal: Negative.  Negative for abdominal pain.  Genitourinary: Negative.   Musculoskeletal: Positive for back pain. Negative for falls.  Skin: Negative.  Negative for rash.  Neurological: Positive for tingling and sensory change. Negative for focal weakness and weakness.  Endo/Heme/Allergies: Negative.   Psychiatric/Behavioral: Negative.  Negative for memory loss. The patient is not nervous/anxious.     As per HPI. Otherwise, a complete review of systems  is negative.  PAST MEDICAL HISTORY: Past Medical History:  Diagnosis Date  . Back pain    occasionally  . Breast cancer ([place]) 2009   left  . Depression    takes Paxil and Wellbutrin daily  . Diabetes ([place])    takes Metformin daily  . Genetic testing 02/03/2017   Multi-Cancer panel (83 genes) @ [facility] - No pathogenic mutations detected  . GERD (gastroesophageal reflux disease)   . History of bronchitis 2 yrs ago  . Hyperlipidemia    takes Atorvastatin daily  . Hypertension    takes Losartan-HCTZ daily  . Insomnia    takes Ambien nightly  . Insomnia    takes gabapentin nightly  . Joint pain   . Migraine   . Mood swings   . Osteoarthritis of knee   . Personal history of chemotherapy 12/05/2016   Mets from Breast Cancer  . Personal history of radiation therapy 11/2016  . PONV (postoperative nausea and vomiting)   . Seasonal allergies    takes Allegra daily    PAST SURGICAL HISTORY: Past Surgical History:  Procedure Laterality Date  . BREAST BIOPSY  2009  . cataract surgery Bilateral   . COLONOSCOPY    . JOINT REPLACEMENT Left 2014   knee  . KNEE ARTHROSCOPY Left    x 5  . MASTECTOMY Left   . port a cath placed    . PORTACATH PLACEMENT N/A 09/17/2015   Procedure: INSERTION PORT-A-CATH;  Surgeon: Jules Husbands, MD;  Location: [facility];  Service: General;  Laterality: N/A;  . TOTAL SHOULDER ARTHROPLASTY Right 06/03/2015   Procedure: TOTAL SHOULDER ARTHROPLASTY;  Surgeon: Tania Ade, MD;  Location: [facility];  Service: Orthopedics;  Laterality: Right;  Right total  shoulder arthroplasty  . TOTAL SHOULDER REPLACEMENT Right 06/03/2015  . TUBAL LIGATION      FAMILY HISTORY: Father with non-Hodgkin's lymphoma, 2 paternal aunts with breast cancer.     ADVANCED DIRECTIVES:    HEALTH MAINTENANCE: Social History   Tobacco Use  . Smoking status: Never Smoker  . Smokeless tobacco: Never Used  Substance Use Topics  . Alcohol use: Yes    Alcohol/week: 0.0 oz     Comment: occasionally wine  . Drug use: Yes    Types: Marijuana    Comment: cannabis with no extra     Colonoscopy:  PAP:  Bone density:  Lipid panel:  Allergies  Allergen Reactions  . Ace Inhibitors Cough  . Latex Itching  . Morphine And Related Itching    Caused her to itch terribly. Would prefer if given to take with a benadryl  . Other Itching    Freeze spray. Patient stated that she may be able to use it now because she doesn't use it as often.  Marland Kitchen Penicillins Rash    Has patient had a PCN reaction causing immediate rash, facial/tongue/throat swelling, SOB or lightheadedness with hypotension: No Has patient had a PCN reaction causing severe rash involving mucus membranes or skin necrosis: No Has patient had a PCN reaction that required hospitalization No Has patient had a PCN reaction occurring within the last 10 years: No If all of the above answers are "NO", then may proceed with Cephalosporin use.    Current Outpatient Medications  Medication Sig Dispense Refill  . atorvastatin (LIPITOR) 10 MG tablet Take 10 mg by mouth daily at 6 PM.     . buPROPion (WELLBUTRIN XL) 150 MG 24 hr tablet Take 150 mg by mouth every morning.     . Calcium-Magnesium-Vitamin D (CALCIUM 1200+D3 PO) Take 1 Dose by mouth daily.    . Cyanocobalamin 5000 MCG CAPS Take 5,000 mcg by mouth daily.     . fexofenadine (ALLEGRA) 180 MG tablet Take 180 mg by mouth at bedtime.     . gabapentin (NEURONTIN) 300 MG capsule Take 300 mg by mouth. 2 pills in am,2 pills at noon and 3 pills at night    . letrozole (FEMARA) 2.5 MG tablet Take 1 tablet (2.5 mg total) by mouth daily. 90 tablet 3  . lidocaine-prilocaine (EMLA) cream Apply 1 application topically as needed. Apply to port 1-2 hours prior to chemotherapy appointment. Cover with plastic wrap. 30 g 0  . metFORMIN (GLUCOPHAGE) 850 MG tablet Take 850 mg by mouth 2 (two) times daily with a meal.     . PARoxetine (PAXIL) 40 MG tablet Take 40 mg by mouth every  morning.     . Zolpidem Tartrate (AMBIEN PO) Take 10 mg by mouth.     Marland Kitchen ibuprofen (ADVIL,MOTRIN) 800 MG tablet Take 1 tablet (800 mg total) every 8 (eight) hours as needed by mouth for mild pain. (Patient not taking: Reported on 04/25/2017) 30 tablet 0  . palbociclib (IBRANCE) 75 MG capsule Take 1 capsule (75 mg total) by mouth daily with breakfast. Take for 14 days, then 14 days off. 14 capsule 5   Current Facility-Administered Medications  Medication Dose Route Frequency Provider Last Rate Last Dose  . guaiFENesin-dextromethorphan (ROBITUSSIN DM) 100-10 MG/5ML syrup 5 mL  5 mL Oral Once Lloyd Huger, MD        OBJECTIVE: Vitals:   07/04/17 0958 07/04/17 0959  BP: 125/77 125/77  Pulse: 85 85  Resp: 20 20  Temp: (!) 97.2 F (36.2 C) 97.6 F (36.4 C)     Body mass index is 32.96 kg/m.    ECOG FS:0 - Asymptomatic   General: Well-developed, well-nourished, no acute distress. Eyes: Pink conjunctiva, anicteric sclera. Breast: Left chest wall without evidence of recurrence. Lungs: Clear to auscultation bilaterally. Heart: Regular rate and rhythm. No rubs, murmurs, or gallops. Abdomen: Soft, nontender, nondistended. No organomegaly noted, normoactive bowel sounds. Musculoskeletal: No edema, cyanosis, or clubbing. Neuro: Alert, answering all questions appropriately. Cranial nerves grossly intact. Skin: No rashes or petechiae noted. Psych: Normal affect.  LAB RESULTS:  Lab Results  Component Value Date   NA 138 07/04/2017   K 4.3 07/04/2017   CL 110 07/04/2017   CO2 21 (L) 07/04/2017   GLUCOSE 167 (H) 07/04/2017   BUN 19 07/04/2017   CREATININE 1.10 (H) 07/04/2017   CALCIUM 8.5 (L) 07/04/2017   PROT 6.4 (L) 07/04/2017   ALBUMIN 3.7 07/04/2017   AST 44 (H) 07/04/2017   ALT 21 07/04/2017   ALKPHOS 66 07/04/2017   BILITOT 0.4 07/04/2017   GFRNONAA 52 (L) 07/04/2017   GFRAA 60 (L) 07/04/2017    Lab Results  Component Value Date   WBC 2.2 (L) 07/04/2017   NEUTROABS  1.1 (L) 07/04/2017   HGB 7.6 (L) 07/04/2017   HCT 23.6 (L) 07/04/2017   MCV 81.8 07/04/2017   PLT 285 07/04/2017     STUDIES: No results found.  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 Now with biopsy proven stage IV disease with lymph node and bone metastasis.  PLAN:    1.  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  Continue Ibrance 75 mg for 14 days with 14 days off.  We will continue at this dose given patient's persistent neutropenia and anemia.  Proceed with Zometa today.  Patient will return to clinic in 1 to 2 days for 1 unit of packed red blood cells.   She will then return to clinic in 4 weeks with repeat laboratory and further evaluation.  2.  Osteopenia: Patient's bone mineral density on January 26, 2016 reported a T score of -0.9 which is considered normal. Continue calcium and vitamin D.  Zometa as above.   3.  Anemia: Declining.  Return to clinic as above for 1 unit of packed red blood cells. 4. Peripheral neuropathy: Chronic and unchanged.  Continue gabapentin as prescribed. Neuropathy managed by neurology.  5. Back pain: MRI results as above.  Does not appear to be related to malignancy.  Monitor. 6.  Left IJ clot: Ultrasound results reviewed independently.  Patient has been instructed to discontinue Eliquis. 7.  Leukopenia: Monitor closely.  Will continue with dose reduced Ibrance as above for now.  I spent a total of 30 minutes face-to-face with the patient of which greater than 50% of the visit was spent in counseling and coordination of care as summarized above.   Patient expressed understanding and was in agreement with this plan. She also understands that She can call  clinic at any time with any questions, concerns, or complaints.   Breast cancer   Staging form: Breast, AJCC 7th Edition     Pathologic stage from 08/11/2014: Stage IIIA (T0, N2a, cM0) - Signed by Lloyd Huger, MD on  08/11/2014   Lloyd Huger, MD   07/08/2017 5:38 PM

## 2017-07-03 ENCOUNTER — Ambulatory Visit: Payer: Medicare Other | Attending: Oncology | Admitting: Occupational Therapy

## 2017-07-03 DIAGNOSIS — I972 Postmastectomy lymphedema syndrome: Secondary | ICD-10-CM | POA: Insufficient documentation

## 2017-07-04 ENCOUNTER — Other Ambulatory Visit: Payer: Self-pay

## 2017-07-04 ENCOUNTER — Inpatient Hospital Stay: Payer: Medicare Other | Attending: Oncology

## 2017-07-04 ENCOUNTER — Inpatient Hospital Stay: Payer: Medicare Other

## 2017-07-04 ENCOUNTER — Inpatient Hospital Stay (HOSPITAL_BASED_OUTPATIENT_CLINIC_OR_DEPARTMENT_OTHER): Payer: Medicare Other | Admitting: Oncology

## 2017-07-04 VITALS — BP 125/77 | HR 85 | Temp 97.6°F | Resp 20 | Ht 64.0 in | Wt 192.0 lb

## 2017-07-04 DIAGNOSIS — M545 Low back pain: Secondary | ICD-10-CM

## 2017-07-04 DIAGNOSIS — I82C12 Acute embolism and thrombosis of left internal jugular vein: Secondary | ICD-10-CM | POA: Insufficient documentation

## 2017-07-04 DIAGNOSIS — G8929 Other chronic pain: Secondary | ICD-10-CM | POA: Insufficient documentation

## 2017-07-04 DIAGNOSIS — C50912 Malignant neoplasm of unspecified site of left female breast: Secondary | ICD-10-CM | POA: Insufficient documentation

## 2017-07-04 DIAGNOSIS — D649 Anemia, unspecified: Secondary | ICD-10-CM

## 2017-07-04 DIAGNOSIS — Z170 Estrogen receptor positive status [ER+]: Principal | ICD-10-CM

## 2017-07-04 DIAGNOSIS — C779 Secondary and unspecified malignant neoplasm of lymph node, unspecified: Secondary | ICD-10-CM

## 2017-07-04 DIAGNOSIS — D72819 Decreased white blood cell count, unspecified: Secondary | ICD-10-CM

## 2017-07-04 DIAGNOSIS — G620 Drug-induced polyneuropathy: Secondary | ICD-10-CM | POA: Insufficient documentation

## 2017-07-04 DIAGNOSIS — M8580 Other specified disorders of bone density and structure, unspecified site: Secondary | ICD-10-CM | POA: Insufficient documentation

## 2017-07-04 DIAGNOSIS — C7951 Secondary malignant neoplasm of bone: Secondary | ICD-10-CM | POA: Diagnosis not present

## 2017-07-04 DIAGNOSIS — C50919 Malignant neoplasm of unspecified site of unspecified female breast: Secondary | ICD-10-CM

## 2017-07-04 DIAGNOSIS — Z79899 Other long term (current) drug therapy: Secondary | ICD-10-CM | POA: Insufficient documentation

## 2017-07-04 LAB — COMPREHENSIVE METABOLIC PANEL
ALBUMIN: 3.7 g/dL (ref 3.5–5.0)
ALK PHOS: 66 U/L (ref 38–126)
ALT: 21 U/L (ref 14–54)
ANION GAP: 7 (ref 5–15)
AST: 44 U/L — ABNORMAL HIGH (ref 15–41)
BILIRUBIN TOTAL: 0.4 mg/dL (ref 0.3–1.2)
BUN: 19 mg/dL (ref 6–20)
CALCIUM: 8.5 mg/dL — AB (ref 8.9–10.3)
CO2: 21 mmol/L — ABNORMAL LOW (ref 22–32)
CREATININE: 1.1 mg/dL — AB (ref 0.44–1.00)
Chloride: 110 mmol/L (ref 101–111)
GFR calc Af Amer: 60 mL/min — ABNORMAL LOW (ref >60)
GFR calc non Af Amer: 52 mL/min — ABNORMAL LOW (ref >60)
GLUCOSE: 167 mg/dL — AB (ref 65–99)
Potassium: 4.3 mmol/L (ref 3.5–5.1)
Sodium: 138 mmol/L (ref 135–145)
TOTAL PROTEIN: 6.4 g/dL — AB (ref 6.5–8.1)

## 2017-07-04 LAB — CBC WITH DIFFERENTIAL/PLATELET
BASOS ABS: 0 10*3/uL (ref 0–0.1)
BASOS PCT: 2 %
EOS ABS: 0.1 10*3/uL (ref 0–0.7)
EOS PCT: 7 %
HCT: 23.6 % — ABNORMAL LOW (ref 35.0–47.0)
Hemoglobin: 7.6 g/dL — ABNORMAL LOW (ref 12.0–16.0)
Lymphocytes Relative: 18 %
Lymphs Abs: 0.4 10*3/uL — ABNORMAL LOW (ref 1.0–3.6)
MCH: 26.2 pg (ref 26.0–34.0)
MCHC: 32 g/dL (ref 32.0–36.0)
MCV: 81.8 fL (ref 80.0–100.0)
MONO ABS: 0.5 10*3/uL (ref 0.2–0.9)
Monocytes Relative: 22 %
NEUTROS ABS: 1.1 10*3/uL — AB (ref 1.4–6.5)
Neutrophils Relative %: 51 %
PLATELETS: 285 10*3/uL (ref 150–440)
RBC: 2.89 MIL/uL — ABNORMAL LOW (ref 3.80–5.20)
RDW: 25.5 % — AB (ref 11.5–14.5)
WBC: 2.2 10*3/uL — ABNORMAL LOW (ref 3.6–11.0)

## 2017-07-04 LAB — SAMPLE TO BLOOD BANK

## 2017-07-04 MED ORDER — HEPARIN SOD (PORK) LOCK FLUSH 100 UNIT/ML IV SOLN: 250.0000 [IU] | Freq: Once | INTRAVENOUS | Status: DC | PRN

## 2017-07-04 MED ORDER — PALBOCICLIB 75 MG PO CAPS: 75.0000 mg | ORAL_CAPSULE | Freq: Every day | ORAL | 5 refills | Status: DC

## 2017-07-04 MED ORDER — LETROZOLE 2.5 MG PO TABS: 2.5000 mg | ORAL_TABLET | Freq: Every day | ORAL | 3 refills | Status: DC

## 2017-07-04 MED ORDER — SODIUM CHLORIDE 0.9% FLUSH
10.0000 mL | Freq: Once | INTRAVENOUS | Status: AC
  Administered 2017-07-04: 10 mL via INTRAVENOUS
  Filled 2017-07-04: qty 10

## 2017-07-04 MED ORDER — SODIUM CHLORIDE 0.9% FLUSH
10.0000 mL | INTRAVENOUS | Status: DC | PRN
  Administered 2017-07-04: 10 mL
  Filled 2017-07-04: qty 10

## 2017-07-04 MED ORDER — ZOLEDRONIC ACID 4 MG/100ML IV SOLN
4.0000 mg | Freq: Once | INTRAVENOUS | Status: AC
  Administered 2017-07-04: 4 mg via INTRAVENOUS
  Filled 2017-07-04: qty 100

## 2017-07-04 MED ORDER — HEPARIN SOD (PORK) LOCK FLUSH 100 UNIT/ML IV SOLN
500.0000 [IU] | Freq: Once | INTRAVENOUS | Status: AC
  Administered 2017-07-04: 500 [IU] via INTRAVENOUS

## 2017-07-04 MED ORDER — SODIUM CHLORIDE 0.9 % IV SOLN
Freq: Once | INTRAVENOUS | Status: AC
  Administered 2017-07-04: 11:00:00 via INTRAVENOUS
  Filled 2017-07-04: qty 1000

## 2017-07-04 NOTE — Progress Notes (Signed)
WBC 2.2 , hgb 7.6, ANC 1.1, Calcium 8.5, Per Tillie Rung RN per Dr. Grayland Ormond okay to proceed with Zometa infusion.

## 2017-07-05 LAB — PREPARE RBC (CROSSMATCH)

## 2017-07-06 ENCOUNTER — Inpatient Hospital Stay: Payer: Medicare Other

## 2017-07-06 DIAGNOSIS — C50912 Malignant neoplasm of unspecified site of left female breast: Secondary | ICD-10-CM | POA: Diagnosis not present

## 2017-07-06 DIAGNOSIS — Z170 Estrogen receptor positive status [ER+]: Secondary | ICD-10-CM | POA: Diagnosis not present

## 2017-07-06 DIAGNOSIS — C779 Secondary and unspecified malignant neoplasm of lymph node, unspecified: Secondary | ICD-10-CM | POA: Diagnosis not present

## 2017-07-06 DIAGNOSIS — G620 Drug-induced polyneuropathy: Secondary | ICD-10-CM | POA: Diagnosis not present

## 2017-07-06 DIAGNOSIS — C7951 Secondary malignant neoplasm of bone: Secondary | ICD-10-CM | POA: Diagnosis not present

## 2017-07-06 DIAGNOSIS — D649 Anemia, unspecified: Secondary | ICD-10-CM | POA: Diagnosis not present

## 2017-07-06 DIAGNOSIS — C50919 Malignant neoplasm of unspecified site of unspecified female breast: Secondary | ICD-10-CM

## 2017-07-06 MED ORDER — HEPARIN SOD (PORK) LOCK FLUSH 100 UNIT/ML IV SOLN
500.0000 [IU] | Freq: Every day | INTRAVENOUS | Status: AC | PRN
  Administered 2017-07-06: 500 [IU]
  Filled 2017-07-06: qty 5

## 2017-07-06 MED ORDER — SODIUM CHLORIDE 0.9 % IV SOLN
250.0000 mL | Freq: Once | INTRAVENOUS | Status: AC
  Administered 2017-07-06: 250 mL via INTRAVENOUS
  Filled 2017-07-06: qty 250

## 2017-07-06 MED ORDER — ACETAMINOPHEN 325 MG PO TABS
650.0000 mg | ORAL_TABLET | Freq: Once | ORAL | Status: AC
  Administered 2017-07-06: 650 mg via ORAL
  Filled 2017-07-06: qty 2

## 2017-07-06 MED ORDER — DIPHENHYDRAMINE HCL 50 MG/ML IJ SOLN
25.0000 mg | Freq: Once | INTRAMUSCULAR | Status: AC
  Administered 2017-07-06: 25 mg via INTRAVENOUS
  Filled 2017-07-06: qty 1

## 2017-07-07 ENCOUNTER — Encounter: Payer: Self-pay | Admitting: Occupational Therapy

## 2017-07-07 LAB — BPAM RBC
BLOOD PRODUCT EXPIRATION DATE: 201907162359
ISSUE DATE / TIME: 201906210922
UNIT TYPE AND RH: 5100

## 2017-07-07 LAB — TYPE AND SCREEN
ABO/RH(D): O POS
Antibody Screen: NEGATIVE
UNIT DIVISION: 0

## 2017-07-07 NOTE — Therapy (Signed)
[facility] [address], [place], [address] Phone: [phone]   Fax:  [phone]  Occupational Therapy Treatment/Progress Update Period from 04/2017 to 07/03/2017  Patient Details  Name: Amanda [name] MRN: [id number] Date of Birth: 09-18-1950 No data recorded  Encounter Date: 07/03/2017  OT End of Session - 07/07/17 1017    Visit Number  10    Number of Visits  11    Date for OT Re-Evaluation  08/14/17    OT Start Time  0930    OT Stop Time  1014    OT Time Calculation (min)  44 min    Activity Tolerance  Patient tolerated treatment well    Behavior During Therapy  [facility] for tasks assessed/performed       Past Medical History:  Diagnosis Date  . Back pain    occasionally  . Breast cancer ([place]) 2009   left  . Depression    takes Paxil and Wellbutrin daily  . Diabetes ([place])    takes Metformin daily  . Genetic testing 02/03/2017   Multi-Cancer panel (83 genes) @ [facility] - No pathogenic mutations detected  . GERD (gastroesophageal reflux disease)   . History of bronchitis 2 yrs ago  . Hyperlipidemia    takes Atorvastatin daily  . Hypertension    takes Losartan-HCTZ daily  . Insomnia    takes Ambien nightly  . Insomnia    takes gabapentin nightly  . Joint pain   . Migraine   . Mood swings   . Osteoarthritis of knee   . Personal history of chemotherapy 12/05/2016   Mets from Breast Cancer  . Personal history of radiation therapy 11/2016  . PONV (postoperative nausea and vomiting)   . Seasonal allergies    takes Allegra daily    Past Surgical History:  Procedure Laterality Date  . BREAST BIOPSY  2009  . cataract surgery Bilateral   . COLONOSCOPY    . JOINT REPLACEMENT Left 2014   knee  . KNEE ARTHROSCOPY Left    x 5  . MASTECTOMY Left   . port a cath placed    . PORTACATH PLACEMENT N/A 09/17/2015   Procedure: INSERTION PORT-A-CATH;  Surgeon: Jules Husbands, MD;  Location: [facility];   Service: General;  Laterality: N/A;  . TOTAL SHOULDER ARTHROPLASTY Right 06/03/2015   Procedure: TOTAL SHOULDER ARTHROPLASTY;  Surgeon: Tania Ade, MD;  Location: [facility];  Service: Orthopedics;  Laterality: Right;  Right total shoulder arthroplasty  . TOTAL SHOULDER REPLACEMENT Right 06/03/2015  . TUBAL LIGATION      There were no vitals filed for this visit.  Subjective Assessment - 07/07/17 1014    Subjective   Pt arrived and reports she is frustrated since she got new garments and does not think they are correct.  She doesn't know what else to do.      Pertinent History  Patient was diagnosed with left breast cancer in 2010 with mastectomy and 11 lymph nodes removed.  She had 36 radiation treatments.  In December of 2017 she received radiation to her lower back and now she will have to receive 10 more radiation treatments to her thoracic area from recent findings of metastasis.  She has a compression sleeve, gauntlet and does not wear compression at night.  She wears compression bras most days. She has neuropathy in bilateral feet and denies any numbness or tingling in hands.     Patient Stated Goals  Patient reports she wants to control the swelling in her left arm, be able to take care of herself.     Currently in Pain?  No/denies    Pain Score  0-No pain          LYMPHEDEMA/ONCOLOGY QUESTIONNAIRE - 07/07/17 1015      Left Upper Extremity Lymphedema   15 cm Proximal to Olecranon Process  39.2 cm    10 cm Proximal to Olecranon Process  38 cm    Olecranon Process  27 cm    15 cm Proximal to Ulnar Styloid Process  26 cm    10 cm Proximal to Ulnar Styloid Process  21.9 cm    Just Proximal to Ulnar Styloid Process  16.5 cm    Across Hand at [employer]  19.5 cm    At [place] of 2nd Digit  6.7 cm    At [facility] of Thumb  7 cm        Circumferential measurements taken and compared to past measurements with assessment Patient fitted for new compression garments with  recommendations Off the shelf sleeve, patient did receive correct size, sleeve is slightly long, she appears to be between a regular and long sleeve. Fit appears to be good, if she has issues at the elbow in the future she realizes the alternative is to consider a custom garment to help with fit in this area New gauntlets did not work for patient and are a Actor product, recommend patient use Juzo gauntlet which she still has one new one to wear and will return the [place] is [place] size 9 Will get patient more tubigrip size F for nighttime and she can pick it up next date. Patient will call in with feed back after wearing sleeve today. Patient will likely need a follow up appt in a few weeks to check measurements and status               OT Education - 07/07/17 1016    Education provided  Yes    Education Details  garment recommendations for gauntlet, wear and care of compression garments.    Person(s) Educated  Patient    Methods  Explanation;Demonstration    Comprehension  Verbalized understanding;Returned demonstration          OT Long Term Goals - 03/19/17 1349      OT LONG TERM GOAL #1   Title  Patient will be independent in wear, care and schedule of compression garments.     Baseline  just found last week prosthesis and compression bra with jovipak she likes and will wear but need new compression daysleeve     Time  8    Period  Weeks    Status  On-going    Target Date  05/14/17      OT LONG TERM GOAL #2   Title  Patient will demonstrate HEP with modified independence.     Status  Achieved      OT LONG TERM GOAL #3   Title  Patient will decrease circumferential measurements by 1.5 cm from mid forearm to upper arm to fit into compression garment and for maintenace of lymphedema symptoms.     Status  Achieved            Plan - 07/07/17 1018    Clinical Impression Statement  Patient continues to demonstrate decreases in  measurements but is having difficulty with fit of new garments.  Rechecked her measurements  and she is fitted correctly for the 2 daytime sleeves she has which are off the shelf and not custom.  She has a new set of gauntlets they do not appear to fit well for her and cause increase pain in the hands and unable to tolerate wear.  She was wearing a Juzo gauntlet in the past versus the new Jobst ones.  She brought in her flesh colored arm sleeve which therapist donned and checked fit and patient felt it was better , cues for donning and positioning of sleeve for optimal fit.  She was fitted in a size long which she may could likely wear a regular.  She will try the sleeve this date and see how it feels throughout the day, she forgot the black sleeve but it is the same brand and size.  Recommend she wear one of her past Juzo gauntlets with this sleeve, still has a new one and to return the Jobst brand gauntlets.  Patient agrees with this plan.  She will follow up and let therapist know tomorrow how sleeve worked today.  Therapist to get size F tubigrip for night time use. Patient will likely need another follow up appt in a few weeks to determine if these new garments are working for her and if any other changes need to be made to her program.  She continues to benefit from skilled OT services to manage and tx lymphedema symptoms.         Patient will benefit from skilled therapeutic intervention in order to improve the following deficits and impairments:     Visit Diagnosis: Postmastectomy lymphedema    Problem List Patient Active Problem List   Diagnosis Date Noted  . Genetic testing 02/03/2017  . Contusion of knee 12/06/2016  . Dislocation of shoulder joint 12/06/2016  . Shoulder joint pain 12/06/2016  . Localized, primary osteoarthritis 12/06/2016  . Osteoarthritis of knee 12/06/2016  . Burning sensation 11/13/2016  . Numbness and tingling 11/13/2016  . Malignant neoplasm of left breast in  female, estrogen receptor positive ([place]) 10/05/2016  . Carcinoma of left breast, stage 4 ([place]) 04/06/2016  . Chemotherapy-induced neuropathy ([place]) 03/07/2016  . Metastatic breast cancer ([place]) 09/24/2015  . 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 Personal history of breast cancer 08/23/2015  . 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 Degenerative arthritis of right shoulder region 01/13/2015  . Cancer of left female breast  ([place]) 07/27/2014  . 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 Rotator cuff tear 06/10/2014  . Allergic rhinitis, seasonal 09/16/2013  . Depression 08/10/2013  . Diabetes mellitus type 2, uncomplicated ([place]) 70/48/8891  . Hyperlipidemia, unspecified 08/10/2013  . BP (high blood pressure) 08/10/2013  . 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 [place], [address] Phone: [phone]   Fax:  [phone]  Name: Amanda [name] MRN: [id number] Date of Birth: May 05, 1950

## 2017-07-07 NOTE — Addendum Note (Signed)
Addended by: Garlon Hatchet T on: 07/07/2017 10:38 AM   Modules accepted: Orders

## 2017-07-09 MED FILL — IBRANCE 75 MG CAPSULE: 75 | 28 days supply | Qty: 14 | Fill #0

## 2017-07-10 ENCOUNTER — Telehealth: Payer: Self-pay | Admitting: Pharmacist

## 2017-07-10 DIAGNOSIS — E119 Type 2 diabetes mellitus without complications: Secondary | ICD-10-CM | POA: Diagnosis not present

## 2017-07-10 NOTE — Telephone Encounter (Signed)
Oral Chemotherapy Pharmacist Encounter   Received call from patient asking if it would be okay for her to take an over the counter iron tablet. Spoke with Dr. Grayland Ormond and he is okay with that. Called patient back to inform her.   Darl Pikes, PharmD, BCPS Hematology/Oncology Clinical Pharmacist [facility] [phone]  07/10/2017 2:27 PM

## 2017-07-18 ENCOUNTER — Ambulatory Visit
Admission: RE | Admit: 2017-07-18 | Discharge: 2017-07-18 | Disposition: A | Discharge status: Home / Self Care | Payer: Medicare Other | Source: Ambulatory Visit | Attending: Radiation Oncology | Admitting: Radiation Oncology

## 2017-07-18 ENCOUNTER — Encounter: Payer: Self-pay | Admitting: Radiation Oncology

## 2017-07-18 ENCOUNTER — Other Ambulatory Visit: Payer: Self-pay

## 2017-07-18 VITALS — BP 128/76 | HR 82 | Temp 95.5°F | Resp 18 | Wt 192.7 lb

## 2017-07-18 DIAGNOSIS — Z08 Encounter for follow-up examination after completed treatment for malignant neoplasm: Secondary | ICD-10-CM | POA: Insufficient documentation

## 2017-07-18 DIAGNOSIS — C50919 Malignant neoplasm of unspecified site of unspecified female breast: Secondary | ICD-10-CM | POA: Insufficient documentation

## 2017-07-18 DIAGNOSIS — Z923 Personal history of irradiation: Secondary | ICD-10-CM | POA: Diagnosis not present

## 2017-07-18 DIAGNOSIS — C7951 Secondary malignant neoplasm of bone: Secondary | ICD-10-CM

## 2017-07-18 NOTE — Progress Notes (Signed)
Radiation Oncology Follow up Note  Name: Amanda [name]   Date:   07/18/2017 MRN:  [id number] DOB: 10/23/1950    This 67 y.o. female presents to the clinic today for one-month follow-up status post palliative radiation therapy to her spine for stage IV breast cancer.  REFERRING PROVIDER: Sofie Hartigan, MD  HPI: patient is a 67 year old female now 1 month out having completed palliative radiation therapy to her upper lumbar spine for stage IV metastatic breast cancer. She's had an excellent response with almost total diminution in pain. She states she is having no spine pain or other bony pain at this point in time..she is currently on [place]. She is ambulating well.  COMPLICATIONS OF TREATMENT: none  FOLLOW UP COMPLIANCE: keeps appointments   PHYSICAL EXAM:  BP 128/76   Pulse 82   Temp (!) 95.5 F (35.3 C)   Resp 18   Wt 192 lb 10.9 oz (87.4 kg)   BMI 33.07 kg/m  No pain is elicited on deep palpation of her spine motor sensory and DTR levels are equal and symmetric in the upper lower extremities. Proprioception is intact. Well-developed well-nourished patient in NAD. HEENT reveals PERLA, EOMI, discs not visualized.  Oral cavity is clear. No oral mucosal lesions are identified. Neck is clear without evidence of cervical or supraclavicular adenopathy. Lungs are clear to A&P. Cardiac examination is essentially unremarkable with regular rate and rhythm without murmur rub or thrill. Abdomen is benign with no organomegaly or masses noted. Motor sensory and DTR levels are equal and symmetric in the upper and lower extremities. Cranial nerves II through XII are grossly intact. Proprioception is intact. No peripheral adenopathy or edema is identified. No motor or sensory levels are noted. Crude visual fields are within normal range.  RADIOLOGY RESULTS: no current films for review  PLAN: present time patient is achieved excellent palliation from radiation therapy. I'm going to  turn follow-up care over to medical oncology. I would be happy to reevaluate patient at any time should further radiation therapy and palliation be indicated.  I would like to take this opportunity to thank you for allowing me to participate in the care of your patient.Noreene Filbert, MD

## 2017-07-29 NOTE — Progress Notes (Signed)
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  INTERVAL HISTORY: Patient returns to clinic today for further evaluation, laboratory work, and continuation of Ibrance and Zometa.  She has chronic back pain, but otherwise feels well.  She also has chronic weakness and fatigue.  She continues to have neuropathy in her fingertips and feet which is unchanged. She has no other neurologic complaints. She denies any other pain. She denies any recent fevers or illnesses.  She denies any chest pain, cough, hemoptysis, or shortness of breath. She denies any nausea, vomiting, constipation, or diarrhea.  She has no urinary complaints.  Patient otherwise feels well and offers no further specific complaints.  REVIEW OF SYSTEMS:   Review of Systems  Constitutional: Positive for malaise/fatigue. Negative for fever and weight loss.  Eyes: Negative.  Negative for blurred vision, double vision and pain.  Respiratory: Negative.  Negative for cough and shortness of breath.   Cardiovascular: Negative.  Negative for chest pain and leg swelling.  Gastrointestinal: Negative.  Negative for abdominal pain.  Genitourinary: Negative.   Musculoskeletal: Positive for back pain. Negative for falls.  Skin: Negative.  Negative for rash.  Neurological: Positive for tingling, sensory change and weakness. Negative for focal weakness and headaches.  Endo/Heme/Allergies: Negative.   Psychiatric/Behavioral: Negative.  Negative for memory loss. The patient is not nervous/anxious.     As per HPI. Otherwise, a complete review of systems is  negative.  PAST MEDICAL HISTORY: Past Medical History:  Diagnosis Date  . Back pain    occasionally  . Breast cancer ([place]) 2009   left  . Depression    takes Paxil and Wellbutrin daily  . Diabetes ([place])    takes Metformin daily  . Genetic testing 02/03/2017   Multi-Cancer panel (83 genes) @ [facility] - No pathogenic mutations detected  . GERD (gastroesophageal reflux disease)   . History of bronchitis 2 yrs ago  . Hyperlipidemia    takes Atorvastatin daily  . Hypertension    takes Losartan-HCTZ daily  . Insomnia    takes Ambien nightly  . Insomnia    takes gabapentin nightly  . Joint pain   . Migraine   . Mood swings   . Osteoarthritis of knee   . Personal history of chemotherapy 12/05/2016   Mets from Breast Cancer  . Personal history of radiation therapy 11/2016  . PONV (postoperative nausea and vomiting)   . Seasonal allergies    takes Allegra daily    PAST SURGICAL HISTORY: Past Surgical History:  Procedure Laterality Date  . BREAST BIOPSY  2009  . cataract surgery Bilateral   . COLONOSCOPY    . JOINT REPLACEMENT Left 2014   knee  . KNEE ARTHROSCOPY Left    x 5  . MASTECTOMY Left   . port a cath placed    . PORTACATH PLACEMENT N/A 09/17/2015   Procedure: INSERTION PORT-A-CATH;  Surgeon: Jules Husbands, MD;  Location: [facility];  Service: General;  Laterality: N/A;  . TOTAL SHOULDER ARTHROPLASTY Right 06/03/2015   Procedure: TOTAL SHOULDER ARTHROPLASTY;  Surgeon: Tania Ade, MD;  Location: [place];  Service: Orthopedics;  Laterality: Right;  Right total shoulder arthroplasty  .  TOTAL SHOULDER REPLACEMENT Right 06/03/2015  . TUBAL LIGATION      FAMILY HISTORY: Father with non-Hodgkin's lymphoma, 2 paternal aunts with breast cancer.     ADVANCED DIRECTIVES:    HEALTH MAINTENANCE: Social History   Tobacco Use  . Smoking status: Never Smoker  . Smokeless tobacco: Never Used  Substance Use Topics  . Alcohol use: Yes    Alcohol/week: 0.0 oz    Comment:  occasionally wine  . Drug use: Yes    Types: Marijuana    Comment: cannabis with no extra     Colonoscopy:  PAP:  Bone density:  Lipid panel:  Allergies  Allergen Reactions  . Ace Inhibitors Cough  . Latex Itching  . Morphine And Related Itching    Caused her to itch terribly. Would prefer if given to take with a benadryl  . Other Itching    Freeze spray. Patient stated that she may be able to use it now because she doesn't use it as often.  Marland Kitchen Penicillins Rash    Has patient had a PCN reaction causing immediate rash, facial/tongue/throat swelling, SOB or lightheadedness with hypotension: No Has patient had a PCN reaction causing severe rash involving mucus membranes or skin necrosis: No Has patient had a PCN reaction that required hospitalization No Has patient had a PCN reaction occurring within the last 10 years: No If all of the above answers are "NO", then may proceed with Cephalosporin use.    Current Outpatient Medications  Medication Sig Dispense Refill  . atorvastatin (LIPITOR) 10 MG tablet Take 10 mg by mouth daily at 6 PM.     . buPROPion (WELLBUTRIN XL) 150 MG 24 hr tablet Take 150 mg by mouth every morning.     . Calcium-Magnesium-Vitamin D (CALCIUM 1200+D3 PO) Take 1 Dose by mouth daily.    . Cyanocobalamin 5000 MCG CAPS Take 5,000 mcg by mouth daily.     . fexofenadine (ALLEGRA) 180 MG tablet Take 180 mg by mouth at bedtime.     . gabapentin (NEURONTIN) 300 MG capsule Take 300 mg by mouth. 2 pills in am,2 pills at noon and 3 pills at night    . letrozole (FEMARA) 2.5 MG tablet Take 1 tablet (2.5 mg total) by mouth daily. 90 tablet 3  . lidocaine-prilocaine (EMLA) cream Apply 1 application topically as needed. Apply to port 1-2 hours prior to chemotherapy appointment. Cover with plastic wrap. 30 g 0  . metFORMIN (GLUCOPHAGE) 850 MG tablet Take 850 mg by mouth 2 (two) times daily with a meal.     . palbociclib (IBRANCE) 75 MG capsule Take 1 capsule (75 mg total) by  mouth daily with breakfast. Take for 14 days, then 14 days off. 14 capsule 5  . PARoxetine (PAXIL) 40 MG tablet Take 40 mg by mouth every morning.     . Zolpidem Tartrate (AMBIEN PO) Take 10 mg by mouth.     Marland Kitchen ibuprofen (ADVIL,MOTRIN) 800 MG tablet Take 1 tablet (800 mg total) every 8 (eight) hours as needed by mouth for mild pain. (Patient not taking: Reported on 04/25/2017) 30 tablet 0   Current Facility-Administered Medications  Medication Dose Route Frequency Provider Last Rate Last Dose  . guaiFENesin-dextromethorphan (ROBITUSSIN DM) 100-10 MG/5ML syrup 5 mL  5 mL Oral Once Lloyd Huger, MD        OBJECTIVE: Vitals:   08/01/17 1050  BP: (!) 153/87  Pulse: 81  Resp: 20  Temp: (!) 97 F (36.1 C)  Body mass index is 33.27 kg/m.    ECOG FS:0 - Asymptomatic  General: Well-developed, well-nourished, no acute distress. Eyes: Pink conjunctiva, anicteric sclera. HEENT: Normocephalic, moist mucous membranes. Breast: Left chest wall without evidence of recurrence.  Exam deferred today. Lungs: Clear to auscultation bilaterally. Heart: Regular rate and rhythm. No rubs, murmurs, or gallops. Abdomen: Soft, nontender, nondistended. No organomegaly noted, normoactive bowel sounds. Musculoskeletal: No edema, cyanosis, or clubbing. Neuro: Alert, answering all questions appropriately. Cranial nerves grossly intact. Skin: No rashes or petechiae noted. Psych: Normal affect.  LAB RESULTS:  Lab Results  Component Value Date   NA 138 08/01/2017   K 4.4 08/01/2017   CL 108 08/01/2017   CO2 21 (L) 08/01/2017   GLUCOSE 123 (H) 08/01/2017   BUN 18 08/01/2017   CREATININE 1.18 (H) 08/01/2017   CALCIUM 8.8 (L) 08/01/2017   PROT 6.2 (L) 08/01/2017   ALBUMIN 3.6 08/01/2017   AST 48 (H) 08/01/2017   ALT 21 08/01/2017   ALKPHOS 65 08/01/2017   BILITOT 0.4 08/01/2017   GFRNONAA 47 (L) 08/01/2017   GFRAA 55 (L) 08/01/2017    Lab Results  Component Value Date   WBC 2.2 (L) 08/01/2017    NEUTROABS 1.2 (L) 08/01/2017   HGB 7.9 (L) 08/01/2017   HCT 24.3 (L) 08/01/2017   MCV 88.9 08/01/2017   PLT 233 08/01/2017     STUDIES: No results found.  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 Now with biopsy proven stage IV disease with lymph node and bone metastasis.  PLAN:    1.  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  Despite persistent neutropenia and anemia, will continue Ibrance 75 mg for 14 days with 14 days off.  Proceed with Zometa today.  Return to clinic in 4 weeks with repeat laboratory work, further evaluation, and consideration of continuation of treatment.   2.  Osteopenia: Patient's bone mineral density on January 26, 2016 reported a T score of -0.9 which is considered normal. Continue calcium and vitamin D.  Zometa as above.   3.  Anemia: Hemoglobin decreased, but significantly improved after receiving recent transfusion.  She does not require transfusion today.  Monitor.   4. Peripheral neuropathy: Chronic and unchanged.  Continue gabapentin as prescribed. Neuropathy managed by neurology.  5. Back pain: MRI results from May 04, 2017 did not reveal metastatic disease.  Continue symptomatic treatment.  6.  Left IJ clot: Ultrasound results reviewed independently.  Patient has been instructed to discontinue Eliquis. 7.  Leukopenia: Chronic and unchanged.  Will continue with dose reduced Ibrance as above for now   Patient expressed understanding and was in agreement with this plan. She also understands that She can call clinic at any time with any questions, concerns, or complaints.   Breast cancer   Staging form: Breast, AJCC 7th Edition     Pathologic stage from  08/11/2014: Stage IIIA (T0, N2a, cM0) - Signed by Lloyd Huger, MD on 08/11/2014   Lloyd Huger, MD   08/05/2017 10:13 AM

## 2017-08-01 ENCOUNTER — Encounter: Payer: Self-pay | Admitting: Oncology

## 2017-08-01 ENCOUNTER — Inpatient Hospital Stay (HOSPITAL_BASED_OUTPATIENT_CLINIC_OR_DEPARTMENT_OTHER): Payer: Medicare Other | Admitting: Oncology

## 2017-08-01 ENCOUNTER — Inpatient Hospital Stay: Payer: Medicare Other | Attending: Oncology

## 2017-08-01 ENCOUNTER — Inpatient Hospital Stay: Payer: Medicare Other

## 2017-08-01 VITALS — BP 153/87 | HR 81 | Temp 97.0°F | Resp 20 | Wt 193.8 lb

## 2017-08-01 DIAGNOSIS — M8580 Other specified disorders of bone density and structure, unspecified site: Secondary | ICD-10-CM | POA: Insufficient documentation

## 2017-08-01 DIAGNOSIS — R531 Weakness: Secondary | ICD-10-CM | POA: Insufficient documentation

## 2017-08-01 DIAGNOSIS — G620 Drug-induced polyneuropathy: Secondary | ICD-10-CM

## 2017-08-01 DIAGNOSIS — Z170 Estrogen receptor positive status [ER+]: Secondary | ICD-10-CM | POA: Diagnosis not present

## 2017-08-01 DIAGNOSIS — C7951 Secondary malignant neoplasm of bone: Secondary | ICD-10-CM | POA: Diagnosis not present

## 2017-08-01 DIAGNOSIS — D649 Anemia, unspecified: Secondary | ICD-10-CM | POA: Diagnosis not present

## 2017-08-01 DIAGNOSIS — R5382 Chronic fatigue, unspecified: Secondary | ICD-10-CM | POA: Diagnosis not present

## 2017-08-01 DIAGNOSIS — D72818 Other decreased white blood cell count: Secondary | ICD-10-CM

## 2017-08-01 DIAGNOSIS — C50919 Malignant neoplasm of unspecified site of unspecified female breast: Secondary | ICD-10-CM

## 2017-08-01 DIAGNOSIS — I82C12 Acute embolism and thrombosis of left internal jugular vein: Secondary | ICD-10-CM | POA: Diagnosis not present

## 2017-08-01 DIAGNOSIS — C50912 Malignant neoplasm of unspecified site of left female breast: Secondary | ICD-10-CM | POA: Diagnosis not present

## 2017-08-01 DIAGNOSIS — M545 Low back pain: Secondary | ICD-10-CM | POA: Insufficient documentation

## 2017-08-01 DIAGNOSIS — C778 Secondary and unspecified malignant neoplasm of lymph nodes of multiple regions: Secondary | ICD-10-CM

## 2017-08-01 DIAGNOSIS — G8929 Other chronic pain: Secondary | ICD-10-CM

## 2017-08-01 LAB — COMPREHENSIVE METABOLIC PANEL
ALT: 21 U/L (ref 0–44)
AST: 48 U/L — ABNORMAL HIGH (ref 15–41)
Albumin: 3.6 g/dL (ref 3.5–5.0)
Alkaline Phosphatase: 65 U/L (ref 38–126)
Anion gap: 9 (ref 5–15)
BILIRUBIN TOTAL: 0.4 mg/dL (ref 0.3–1.2)
BUN: 18 mg/dL (ref 8–23)
CALCIUM: 8.8 mg/dL — AB (ref 8.9–10.3)
CO2: 21 mmol/L — ABNORMAL LOW (ref 22–32)
CREATININE: 1.18 mg/dL — AB (ref 0.44–1.00)
Chloride: 108 mmol/L (ref 98–111)
GFR, EST AFRICAN AMERICAN: 55 mL/min — AB (ref >60)
GFR, EST NON AFRICAN AMERICAN: 47 mL/min — AB (ref >60)
Glucose, Bld: 123 mg/dL — ABNORMAL HIGH (ref 70–99)
Potassium: 4.4 mmol/L (ref 3.5–5.1)
Sodium: 138 mmol/L (ref 135–145)
Total Protein: 6.2 g/dL — ABNORMAL LOW (ref 6.5–8.1)

## 2017-08-01 LAB — CBC WITH DIFFERENTIAL/PLATELET
BASOS PCT: 4 %
Basophils Absolute: 0.1 10*3/uL (ref 0–0.1)
EOS ABS: 0.2 10*3/uL (ref 0–0.7)
EOS PCT: 7 %
HCT: 24.3 % — ABNORMAL LOW (ref 35.0–47.0)
HEMOGLOBIN: 7.9 g/dL — AB (ref 12.0–16.0)
Lymphocytes Relative: 14 %
Lymphs Abs: 0.3 10*3/uL — ABNORMAL LOW (ref 1.0–3.6)
MCH: 28.8 pg (ref 26.0–34.0)
MCHC: 32.4 g/dL (ref 32.0–36.0)
MCV: 88.9 fL (ref 80.0–100.0)
Monocytes Absolute: 0.4 10*3/uL (ref 0.2–0.9)
Monocytes Relative: 20 %
NEUTROS PCT: 55 %
Neutro Abs: 1.2 10*3/uL — ABNORMAL LOW (ref 1.4–6.5)
Platelets: 233 10*3/uL (ref 150–440)
RBC: 2.73 MIL/uL — AB (ref 3.80–5.20)
RDW: 29.3 % — ABNORMAL HIGH (ref 11.5–14.5)
WBC: 2.2 10*3/uL — AB (ref 3.6–11.0)

## 2017-08-01 LAB — SAMPLE TO BLOOD BANK

## 2017-08-01 MED ORDER — ZOLEDRONIC ACID 4 MG/100ML IV SOLN
4.0000 mg | Freq: Once | INTRAVENOUS | Status: AC
  Administered 2017-08-01: 4 mg via INTRAVENOUS
  Filled 2017-08-01: qty 100

## 2017-08-01 MED ORDER — SODIUM CHLORIDE 0.9 % IV SOLN
INTRAVENOUS | Status: DC
  Administered 2017-08-01: 11:00:00 via INTRAVENOUS
  Filled 2017-08-01: qty 1000

## 2017-08-01 MED ORDER — SODIUM CHLORIDE 0.9% FLUSH
10.0000 mL | Freq: Once | INTRAVENOUS | Status: AC
  Administered 2017-08-01: 10 mL via INTRAVENOUS
  Filled 2017-08-01: qty 10

## 2017-08-01 MED ORDER — HEPARIN SOD (PORK) LOCK FLUSH 100 UNIT/ML IV SOLN
INTRAVENOUS | Status: AC
  Filled 2017-08-01: qty 5

## 2017-08-01 MED ORDER — HEPARIN SOD (PORK) LOCK FLUSH 100 UNIT/ML IV SOLN
500.0000 [IU] | Freq: Once | INTRAVENOUS | Status: AC
  Administered 2017-08-01: 500 [IU] via INTRAVENOUS

## 2017-08-01 NOTE — Progress Notes (Signed)
Patient denies any concerns today.  

## 2017-08-02 DIAGNOSIS — N183 Chronic kidney disease, stage 3 (moderate): Secondary | ICD-10-CM | POA: Diagnosis not present

## 2017-08-02 DIAGNOSIS — E7800 Pure hypercholesterolemia, unspecified: Secondary | ICD-10-CM | POA: Diagnosis not present

## 2017-08-02 DIAGNOSIS — C50919 Malignant neoplasm of unspecified site of unspecified female breast: Secondary | ICD-10-CM | POA: Diagnosis not present

## 2017-08-02 DIAGNOSIS — E1122 Type 2 diabetes mellitus with diabetic chronic kidney disease: Secondary | ICD-10-CM | POA: Diagnosis not present

## 2017-08-02 DIAGNOSIS — I10 Essential (primary) hypertension: Secondary | ICD-10-CM | POA: Diagnosis not present

## 2017-08-02 DIAGNOSIS — K219 Gastro-esophageal reflux disease without esophagitis: Secondary | ICD-10-CM | POA: Diagnosis not present

## 2017-08-02 DIAGNOSIS — F3341 Major depressive disorder, recurrent, in partial remission: Secondary | ICD-10-CM | POA: Diagnosis not present

## 2017-08-02 DIAGNOSIS — F5104 Psychophysiologic insomnia: Secondary | ICD-10-CM | POA: Diagnosis not present

## 2017-08-02 MED FILL — IBRANCE 75 MG CAPSULE: 75 | 28 days supply | Qty: 14 | Fill #1

## 2017-08-21 ENCOUNTER — Telehealth: Payer: Self-pay | Admitting: *Deleted

## 2017-08-21 NOTE — Telephone Encounter (Signed)
Patient called and states her BP at 1 was 114/59 and pulse was 104, at 116, her bp was 116/68 pulse 96. She states Dr Grayland Ormond has been keeping up with this and it is imperative that he get these readings asap

## 2017-08-21 NOTE — Telephone Encounter (Signed)
Ok

## 2017-08-26 NOTE — Progress Notes (Signed)
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 Now with biopsy-proven stage IV disease.  INTERVAL HISTORY: Patient returns to clinic today for further evaluation and continuation of Ibrance and Zometa.  She currently feels well and is asymptomatic.  She continues to have chronic weakness and fatigue.  Her neuropathy is unchanged.  She has no other neurologic complaints. She denies any recent fevers or illnesses.  She denies any chest pain, cough, hemoptysis, or shortness of breath. She denies any nausea, vomiting, constipation, or diarrhea.  She has no urinary complaints.  Patient offers no further specific complaints today.  REVIEW OF SYSTEMS:   Review of Systems  Constitutional: Positive for malaise/fatigue. Negative for fever and weight loss.  Eyes: Negative.  Negative for blurred vision, double vision and pain.  Respiratory: Negative.  Negative for cough and shortness of breath.   Cardiovascular: Negative.  Negative for chest pain and leg swelling.  Gastrointestinal: Negative.  Negative for abdominal pain.  Genitourinary: Negative.   Musculoskeletal: Positive for back pain. Negative for falls.  Skin: Negative.  Negative for rash.  Neurological: Positive for tingling, sensory change and weakness. Negative for focal weakness and headaches.  Endo/Heme/Allergies: Negative.   Psychiatric/Behavioral: Negative.  Negative for memory loss. The patient is not nervous/anxious.     As per HPI. Otherwise, a complete review of systems is negative.  PAST MEDICAL HISTORY: Past Medical History:  Diagnosis Date  . Back pain    occasionally  .  Breast cancer ([place]) 2009   left  . Depression    takes Paxil and Wellbutrin daily  . Diabetes ([place])    takes Metformin daily  . Genetic testing 02/03/2017   Multi-Cancer panel (83 genes) @ [facility] - No pathogenic mutations detected  . GERD (gastroesophageal reflux disease)   . History of bronchitis 2 yrs ago  . Hyperlipidemia    takes Atorvastatin daily  . Hypertension    takes Losartan-HCTZ daily  . Insomnia    takes Ambien nightly  . Insomnia    takes gabapentin nightly  . Joint pain   . Migraine   . Mood swings   . Osteoarthritis of knee   . Personal history of chemotherapy 12/05/2016   Mets from Breast Cancer  . Personal history of radiation therapy 11/2016  . PONV (postoperative nausea and vomiting)   . Seasonal allergies    takes Allegra daily    PAST SURGICAL HISTORY: Past Surgical History:  Procedure Laterality Date  . BREAST BIOPSY  2009  . cataract surgery Bilateral   . COLONOSCOPY    . JOINT REPLACEMENT Left 2014   knee  . KNEE ARTHROSCOPY Left    x 5  . MASTECTOMY Left   . port a cath placed    . PORTACATH PLACEMENT N/A 09/17/2015   Procedure: INSERTION PORT-A-CATH;  Surgeon: Jules Husbands, MD;  Location: [facility];  Service: General;  Laterality: N/A;  . TOTAL SHOULDER ARTHROPLASTY Right 06/03/2015   Procedure: TOTAL SHOULDER ARTHROPLASTY;  Surgeon: Tania Ade, MD;  Location: [place];  Service: Orthopedics;  Laterality: Right;  Right total shoulder arthroplasty  . TOTAL SHOULDER REPLACEMENT Right 06/03/2015  . TUBAL LIGATION      FAMILY HISTORY: Father  with non-Hodgkin's lymphoma, 2 paternal aunts with breast cancer.     ADVANCED DIRECTIVES:    HEALTH MAINTENANCE: Social History   Tobacco Use  . Smoking status: Never Smoker  . Smokeless tobacco: Never Used  Substance Use Topics  . Alcohol use: Yes    Alcohol/week: 0.0 standard drinks    Comment: occasionally wine  . Drug use: Yes    Types: Marijuana    Comment: cannabis with no extra      Colonoscopy:  PAP:  Bone density:  Lipid panel:  Allergies  Allergen Reactions  . Ace Inhibitors Cough  . Latex Itching  . Morphine And Related Itching    Caused her to itch terribly. Would prefer if given to take with a benadryl  . Other Itching    Freeze spray. Patient stated that she may be able to use it now because she doesn't use it as often.  Marland Kitchen Penicillins Rash    Has patient had a PCN reaction causing immediate rash, facial/tongue/throat swelling, SOB or lightheadedness with hypotension: No Has patient had a PCN reaction causing severe rash involving mucus membranes or skin necrosis: No Has patient had a PCN reaction that required hospitalization No Has patient had a PCN reaction occurring within the last 10 years: No If all of the above answers are "NO", then may proceed with Cephalosporin use.    Current Outpatient Medications  Medication Sig Dispense Refill  . acetaminophen (TYLENOL) 500 MG tablet Take 500 mg by mouth every 6 (six) hours as needed.    Marland Kitchen atorvastatin (LIPITOR) 10 MG tablet Take 10 mg by mouth daily at 6 PM.     . buPROPion (WELLBUTRIN XL) 150 MG 24 hr tablet Take 150 mg by mouth every morning.     . Calcium-Magnesium-Vitamin D (CALCIUM 1200+D3 PO) Take 1 Dose by mouth daily.    . Cyanocobalamin 5000 MCG CAPS Take 5,000 mcg by mouth daily.     . ferrous sulfate 325 (65 FE) MG EC tablet Take 325 mg by mouth daily with breakfast. 65 mg elemental    . fexofenadine (ALLEGRA) 180 MG tablet Take 180 mg by mouth at bedtime.     . gabapentin (NEURONTIN) 300 MG capsule Take 300 mg by mouth. 2 pills in am,2 pills at noon and 3 pills at night    . letrozole (FEMARA) 2.5 MG tablet Take 1 tablet (2.5 mg total) by mouth daily. 90 tablet 3  . metFORMIN (GLUCOPHAGE) 850 MG tablet Take 850 mg by mouth 2 (two) times daily with a meal.     . palbociclib (IBRANCE) 75 MG capsule Take 1 capsule (75 mg total) by mouth daily with breakfast. Take for 14 days, then 14 days  off. 14 capsule 5  . pantoprazole (PROTONIX) 40 MG tablet Take 40 mg by mouth daily.    Marland Kitchen PARoxetine (PAXIL) 40 MG tablet Take 40 mg by mouth every morning.     . prochlorperazine (COMPAZINE) 10 MG tablet Take 10 mg by mouth every 6 (six) hours as needed for nausea or vomiting.    . Zolpidem Tartrate (AMBIEN PO) Take 10 mg by mouth at bedtime as needed.     Marland Kitchen ibuprofen (ADVIL,MOTRIN) 800 MG tablet Take 1 tablet (800 mg total) every 8 (eight) hours as needed by mouth for mild pain. (Patient not taking: Reported on 04/25/2017) 30 tablet 0  . lidocaine-prilocaine (EMLA) cream Apply 1 application topically as needed. Apply to port 1-2 hours prior to chemotherapy appointment. Cover with plastic  wrap. (Patient not taking: Reported on 08/29/2017) 30 g 0   Current Facility-Administered Medications  Medication Dose Route Frequency Provider Last Rate Last Dose  . guaiFENesin-dextromethorphan (ROBITUSSIN DM) 100-10 MG/5ML syrup 5 mL  5 mL Oral Once Lloyd Huger, MD        OBJECTIVE: Vitals:   08/29/17 0942  BP: 116/76  Pulse: 79  Resp: 18  Temp: 97.6 F (36.4 C)     Body mass index is 32.61 kg/m.    ECOG FS:0 - Asymptomatic  General: Well-developed, well-nourished, no acute distress. Eyes: Pink conjunctiva, anicteric sclera. HEENT: Normocephalic, moist mucous membranes. Breast: Left chest wall without evidence of recurrence.  Exam deferred today. Lungs: Clear to auscultation bilaterally. Heart: Regular rate and rhythm. No rubs, murmurs, or gallops. Abdomen: Soft, nontender, nondistended. No organomegaly noted, normoactive bowel sounds. Musculoskeletal: No edema, cyanosis, or clubbing. Neuro: Alert, answering all questions appropriately. Cranial nerves grossly intact. Skin: No rashes or petechiae noted. Psych: Normal affect.  LAB RESULTS:  Lab Results  Component Value Date   NA 138 08/29/2017   K 4.6 08/29/2017   CL 107 08/29/2017   CO2 22 08/29/2017   GLUCOSE 115 (H) 08/29/2017    BUN 21 08/29/2017   CREATININE 1.13 (H) 08/29/2017   CALCIUM 9.1 08/29/2017   PROT 6.4 (L) 08/29/2017   ALBUMIN 3.7 08/29/2017   AST 61 (H) 08/29/2017   ALT 26 08/29/2017   ALKPHOS 77 08/29/2017   BILITOT 0.2 (L) 08/29/2017   GFRNONAA 50 (L) 08/29/2017   GFRAA 58 (L) 08/29/2017    Lab Results  Component Value Date   WBC 3.1 (L) 08/29/2017   NEUTROABS 1.6 08/29/2017   HGB 8.5 (L) 08/29/2017   HCT 26.4 (L) 08/29/2017   MCV 94.4 08/29/2017   PLT 280 08/29/2017     STUDIES: No results found.  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 Now with biopsy proven stage IV disease with lymph node and bone metastasis.  PLAN:    1.  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  Despite persistent neutropenia and anemia, will continue Ibrance 75 mg for 14 days with 14 days off.  Patient initiated monthly Zometa on November 02, 2016.  Proceed with Zometa today.  Will reimage with CT scan and bone scan prior to next appointment.  Return to clinic in 4 weeks for further evaluation and continuation of Zometa. 2.  Osteopenia: Patient's bone mineral density on January 26, 2016 reported a T score of -0.9 which is considered normal. Continue calcium and vitamin D.  Zometa as above.   3.  Anemia: Patient's hemoglobin mildly improved at 8.5 today.  She does not require transfusion today.  Monitor.   4. Peripheral neuropathy: Chronic and unchanged.  Continue gabapentin as prescribed. Neuropathy managed by neurology.  5. Back pain: MRI results from May 04, 2017 did not reveal metastatic disease.  Continue symptomatic treatment.  6.  Left IJ clot: Ultrasound results reviewed independently.  Patient has been  instructed to discontinue Eliquis. 7.  Leukopenia: White blood cell count improved to 3.1.  Continue with dose reduced Ibrance.   Patient expressed understanding and was in agreement with this plan. She also understands that She can call clinic at any time with any questions, concerns, or complaints.  Breast cancer   Staging form: Breast, AJCC 7th Edition     Pathologic stage from 08/11/2014: Stage IIIA (T0, N2a, cM0) - Signed by Lloyd Huger, MD on 08/11/2014   Lloyd Huger, MD   09/02/2017 7:18 AM

## 2017-08-29 ENCOUNTER — Encounter: Payer: Self-pay | Admitting: Oncology

## 2017-08-29 ENCOUNTER — Inpatient Hospital Stay (HOSPITAL_BASED_OUTPATIENT_CLINIC_OR_DEPARTMENT_OTHER): Payer: Medicare Other | Admitting: Oncology

## 2017-08-29 ENCOUNTER — Inpatient Hospital Stay: Payer: Medicare Other

## 2017-08-29 ENCOUNTER — Inpatient Hospital Stay: Payer: Medicare Other | Attending: Oncology

## 2017-08-29 VITALS — BP 116/76 | HR 79 | Temp 97.6°F | Resp 18 | Ht 64.0 in | Wt 190.0 lb

## 2017-08-29 DIAGNOSIS — C7951 Secondary malignant neoplasm of bone: Secondary | ICD-10-CM | POA: Diagnosis not present

## 2017-08-29 DIAGNOSIS — D72818 Other decreased white blood cell count: Secondary | ICD-10-CM | POA: Diagnosis not present

## 2017-08-29 DIAGNOSIS — M545 Low back pain: Secondary | ICD-10-CM | POA: Insufficient documentation

## 2017-08-29 DIAGNOSIS — D649 Anemia, unspecified: Secondary | ICD-10-CM | POA: Insufficient documentation

## 2017-08-29 DIAGNOSIS — G620 Drug-induced polyneuropathy: Secondary | ICD-10-CM | POA: Diagnosis not present

## 2017-08-29 DIAGNOSIS — Z170 Estrogen receptor positive status [ER+]: Secondary | ICD-10-CM

## 2017-08-29 DIAGNOSIS — M8580 Other specified disorders of bone density and structure, unspecified site: Secondary | ICD-10-CM | POA: Diagnosis not present

## 2017-08-29 DIAGNOSIS — C779 Secondary and unspecified malignant neoplasm of lymph node, unspecified: Secondary | ICD-10-CM | POA: Diagnosis not present

## 2017-08-29 DIAGNOSIS — C50912 Malignant neoplasm of unspecified site of left female breast: Secondary | ICD-10-CM

## 2017-08-29 DIAGNOSIS — I82C12 Acute embolism and thrombosis of left internal jugular vein: Secondary | ICD-10-CM | POA: Diagnosis not present

## 2017-08-29 DIAGNOSIS — C50919 Malignant neoplasm of unspecified site of unspecified female breast: Secondary | ICD-10-CM

## 2017-08-29 DIAGNOSIS — R5382 Chronic fatigue, unspecified: Secondary | ICD-10-CM

## 2017-08-29 LAB — COMPREHENSIVE METABOLIC PANEL
ALK PHOS: 77 U/L (ref 38–126)
ALT: 26 U/L (ref 0–44)
AST: 61 U/L — AB (ref 15–41)
Albumin: 3.7 g/dL (ref 3.5–5.0)
Anion gap: 9 (ref 5–15)
BUN: 21 mg/dL (ref 8–23)
CALCIUM: 9.1 mg/dL (ref 8.9–10.3)
CHLORIDE: 107 mmol/L (ref 98–111)
CO2: 22 mmol/L (ref 22–32)
CREATININE: 1.13 mg/dL — AB (ref 0.44–1.00)
GFR, EST AFRICAN AMERICAN: 58 mL/min — AB (ref >60)
GFR, EST NON AFRICAN AMERICAN: 50 mL/min — AB (ref >60)
Glucose, Bld: 115 mg/dL — ABNORMAL HIGH (ref 70–99)
Potassium: 4.6 mmol/L (ref 3.5–5.1)
SODIUM: 138 mmol/L (ref 135–145)
Total Bilirubin: 0.2 mg/dL — ABNORMAL LOW (ref 0.3–1.2)
Total Protein: 6.4 g/dL — ABNORMAL LOW (ref 6.5–8.1)

## 2017-08-29 LAB — CBC WITH DIFFERENTIAL/PLATELET
BASOS PCT: 2 %
Basophils Absolute: 0.1 10*3/uL (ref 0–0.1)
EOS ABS: 0.4 10*3/uL (ref 0–0.7)
EOS PCT: 13 %
HCT: 26.4 % — ABNORMAL LOW (ref 35.0–47.0)
Hemoglobin: 8.5 g/dL — ABNORMAL LOW (ref 12.0–16.0)
LYMPHS ABS: 0.4 10*3/uL — AB (ref 1.0–3.6)
Lymphocytes Relative: 14 %
MCH: 30.4 pg (ref 26.0–34.0)
MCHC: 32.3 g/dL (ref 32.0–36.0)
MCV: 94.4 fL (ref 80.0–100.0)
MONOS PCT: 19 %
Monocytes Absolute: 0.6 10*3/uL (ref 0.2–0.9)
NEUTROS PCT: 52 %
Neutro Abs: 1.6 10*3/uL (ref 1.4–6.5)
PLATELETS: 280 10*3/uL (ref 150–440)
RBC: 2.8 MIL/uL — ABNORMAL LOW (ref 3.80–5.20)
RDW: 25 % — ABNORMAL HIGH (ref 11.5–14.5)
WBC: 3.1 10*3/uL — ABNORMAL LOW (ref 3.6–11.0)

## 2017-08-29 MED ORDER — ZOLEDRONIC ACID 4 MG/100ML IV SOLN
4.0000 mg | Freq: Once | INTRAVENOUS | Status: AC
  Administered 2017-08-29: 4 mg via INTRAVENOUS
  Filled 2017-08-29: qty 100

## 2017-08-29 MED ORDER — SODIUM CHLORIDE 0.9 % IV SOLN
INTRAVENOUS | Status: DC
  Administered 2017-08-29: 11:00:00 via INTRAVENOUS
  Filled 2017-08-29: qty 1000

## 2017-08-29 MED ORDER — HEPARIN SOD (PORK) LOCK FLUSH 100 UNIT/ML IV SOLN
500.0000 [IU] | Freq: Once | INTRAVENOUS | Status: AC
  Administered 2017-08-29: 500 [IU] via INTRAVENOUS
  Filled 2017-08-29: qty 5

## 2017-08-29 NOTE — Progress Notes (Signed)
Pt has fatigue with being on ibrance

## 2017-08-30 MED FILL — IBRANCE 75 MG CAPSULE: 75 | 28 days supply | Qty: 14 | Fill #2

## 2017-09-03 ENCOUNTER — Other Ambulatory Visit: Payer: Self-pay | Admitting: *Deleted

## 2017-09-03 MED ORDER — PROCHLORPERAZINE MALEATE 10 MG PO TABS: 10.0000 mg | ORAL_TABLET | Freq: Four times a day (QID) | ORAL | 3 refills | Status: DC | PRN

## 2017-09-06 ENCOUNTER — Inpatient Hospital Stay: Payer: Medicare Other

## 2017-09-06 DIAGNOSIS — C7951 Secondary malignant neoplasm of bone: Secondary | ICD-10-CM | POA: Diagnosis not present

## 2017-09-06 DIAGNOSIS — Z170 Estrogen receptor positive status [ER+]: Secondary | ICD-10-CM | POA: Diagnosis not present

## 2017-09-06 DIAGNOSIS — D72818 Other decreased white blood cell count: Secondary | ICD-10-CM | POA: Diagnosis not present

## 2017-09-06 DIAGNOSIS — C50919 Malignant neoplasm of unspecified site of unspecified female breast: Secondary | ICD-10-CM

## 2017-09-06 DIAGNOSIS — D649 Anemia, unspecified: Secondary | ICD-10-CM

## 2017-09-06 DIAGNOSIS — C50912 Malignant neoplasm of unspecified site of left female breast: Secondary | ICD-10-CM | POA: Diagnosis not present

## 2017-09-06 DIAGNOSIS — I82C12 Acute embolism and thrombosis of left internal jugular vein: Secondary | ICD-10-CM | POA: Diagnosis not present

## 2017-09-06 LAB — CBC WITH DIFFERENTIAL/PLATELET
BASOS PCT: 3 %
Basophils Absolute: 0.1 10*3/uL (ref 0–0.1)
EOS ABS: 0.3 10*3/uL (ref 0–0.7)
EOS PCT: 11 %
HCT: 26.5 % — ABNORMAL LOW (ref 35.0–47.0)
Hemoglobin: 8.6 g/dL — ABNORMAL LOW (ref 12.0–16.0)
Lymphocytes Relative: 14 %
Lymphs Abs: 0.4 10*3/uL — ABNORMAL LOW (ref 1.0–3.6)
MCH: 30.9 pg (ref 26.0–34.0)
MCHC: 32.5 g/dL (ref 32.0–36.0)
MCV: 95.2 fL (ref 80.0–100.0)
MONO ABS: 0.2 10*3/uL (ref 0.2–0.9)
Monocytes Relative: 9 %
Neutro Abs: 1.7 10*3/uL (ref 1.4–6.5)
Neutrophils Relative %: 63 %
PLATELETS: 346 10*3/uL (ref 150–440)
RBC: 2.79 MIL/uL — ABNORMAL LOW (ref 3.80–5.20)
RDW: 22.8 % — AB (ref 11.5–14.5)
WBC: 2.6 10*3/uL — AB (ref 3.6–11.0)

## 2017-09-06 LAB — COMPREHENSIVE METABOLIC PANEL
ALK PHOS: 84 U/L (ref 38–126)
ALT: 24 U/L (ref 0–44)
ANION GAP: 7 (ref 5–15)
AST: 53 U/L — ABNORMAL HIGH (ref 15–41)
Albumin: 3.7 g/dL (ref 3.5–5.0)
BUN: 17 mg/dL (ref 8–23)
CALCIUM: 9.3 mg/dL (ref 8.9–10.3)
CO2: 23 mmol/L (ref 22–32)
CREATININE: 1.39 mg/dL — AB (ref 0.44–1.00)
Chloride: 109 mmol/L (ref 98–111)
GFR calc Af Amer: 45 mL/min — ABNORMAL LOW (ref >60)
GFR, EST NON AFRICAN AMERICAN: 39 mL/min — AB (ref >60)
Glucose, Bld: 137 mg/dL — ABNORMAL HIGH (ref 70–99)
Potassium: 5.2 mmol/L — ABNORMAL HIGH (ref 3.5–5.1)
Sodium: 139 mmol/L (ref 135–145)
TOTAL PROTEIN: 6.5 g/dL (ref 6.5–8.1)
Total Bilirubin: 0.4 mg/dL (ref 0.3–1.2)

## 2017-09-06 LAB — SAMPLE TO BLOOD BANK

## 2017-09-18 ENCOUNTER — Other Ambulatory Visit: Payer: Self-pay | Admitting: *Deleted

## 2017-09-18 DIAGNOSIS — R5383 Other fatigue: Secondary | ICD-10-CM

## 2017-09-18 DIAGNOSIS — T451X5A Adverse effect of antineoplastic and immunosuppressive drugs, initial encounter: Secondary | ICD-10-CM | POA: Diagnosis not present

## 2017-09-18 DIAGNOSIS — R208 Other disturbances of skin sensation: Secondary | ICD-10-CM | POA: Diagnosis not present

## 2017-09-18 DIAGNOSIS — G620 Drug-induced polyneuropathy: Secondary | ICD-10-CM | POA: Diagnosis not present

## 2017-09-19 ENCOUNTER — Other Ambulatory Visit: Payer: Self-pay

## 2017-09-19 ENCOUNTER — Encounter
Admission: RE | Admit: 2017-09-19 | Discharge: 2017-09-19 | Disposition: A | Discharge status: Home / Self Care | Payer: Medicare Other | Source: Ambulatory Visit | Attending: Oncology | Admitting: Oncology

## 2017-09-19 ENCOUNTER — Inpatient Hospital Stay: Payer: Medicare Other | Attending: Oncology

## 2017-09-19 DIAGNOSIS — D72819 Decreased white blood cell count, unspecified: Secondary | ICD-10-CM | POA: Insufficient documentation

## 2017-09-19 DIAGNOSIS — R630 Anorexia: Secondary | ICD-10-CM | POA: Diagnosis not present

## 2017-09-19 DIAGNOSIS — C7951 Secondary malignant neoplasm of bone: Secondary | ICD-10-CM | POA: Diagnosis not present

## 2017-09-19 DIAGNOSIS — M8580 Other specified disorders of bone density and structure, unspecified site: Secondary | ICD-10-CM | POA: Insufficient documentation

## 2017-09-19 DIAGNOSIS — R531 Weakness: Secondary | ICD-10-CM | POA: Diagnosis not present

## 2017-09-19 DIAGNOSIS — C50919 Malignant neoplasm of unspecified site of unspecified female breast: Secondary | ICD-10-CM | POA: Insufficient documentation

## 2017-09-19 DIAGNOSIS — R5382 Chronic fatigue, unspecified: Secondary | ICD-10-CM | POA: Insufficient documentation

## 2017-09-19 DIAGNOSIS — D649 Anemia, unspecified: Secondary | ICD-10-CM | POA: Diagnosis not present

## 2017-09-19 DIAGNOSIS — G620 Drug-induced polyneuropathy: Secondary | ICD-10-CM | POA: Insufficient documentation

## 2017-09-19 DIAGNOSIS — R110 Nausea: Secondary | ICD-10-CM | POA: Diagnosis not present

## 2017-09-19 DIAGNOSIS — R5383 Other fatigue: Secondary | ICD-10-CM

## 2017-09-19 DIAGNOSIS — C50912 Malignant neoplasm of unspecified site of left female breast: Secondary | ICD-10-CM | POA: Insufficient documentation

## 2017-09-19 DIAGNOSIS — K769 Liver disease, unspecified: Secondary | ICD-10-CM | POA: Insufficient documentation

## 2017-09-19 LAB — COMPREHENSIVE METABOLIC PANEL
ALBUMIN: 3.5 g/dL (ref 3.5–5.0)
ALT: 26 U/L (ref 0–44)
AST: 56 U/L — AB (ref 15–41)
Alkaline Phosphatase: 77 U/L (ref 38–126)
Anion gap: 6 (ref 5–15)
BILIRUBIN TOTAL: 0.5 mg/dL (ref 0.3–1.2)
BUN: 24 mg/dL — AB (ref 8–23)
CHLORIDE: 109 mmol/L (ref 98–111)
CO2: 21 mmol/L — AB (ref 22–32)
Calcium: 8.8 mg/dL — ABNORMAL LOW (ref 8.9–10.3)
Creatinine, Ser: 1.41 mg/dL — ABNORMAL HIGH (ref 0.44–1.00)
GFR calc Af Amer: 44 mL/min — ABNORMAL LOW (ref >60)
GFR calc non Af Amer: 38 mL/min — ABNORMAL LOW (ref >60)
GLUCOSE: 109 mg/dL — AB (ref 70–99)
POTASSIUM: 4.4 mmol/L (ref 3.5–5.1)
Sodium: 136 mmol/L (ref 135–145)
TOTAL PROTEIN: 6.3 g/dL — AB (ref 6.5–8.1)

## 2017-09-19 LAB — SAMPLE TO BLOOD BANK

## 2017-09-19 LAB — CBC WITH DIFFERENTIAL/PLATELET
Basophils Absolute: 0.1 10*3/uL (ref 0–0.1)
Basophils Relative: 3 %
EOS PCT: 9 %
Eosinophils Absolute: 0.2 10*3/uL (ref 0–0.7)
HCT: 22.8 % — ABNORMAL LOW (ref 35.0–47.0)
Hemoglobin: 7.5 g/dL — ABNORMAL LOW (ref 12.0–16.0)
LYMPHS ABS: 0.3 10*3/uL — AB (ref 1.0–3.6)
LYMPHS PCT: 18 %
MCH: 31.9 pg (ref 26.0–34.0)
MCHC: 32.6 g/dL (ref 32.0–36.0)
MCV: 97.8 fL (ref 80.0–100.0)
MONO ABS: 0.5 10*3/uL (ref 0.2–0.9)
MONOS PCT: 25 %
Neutro Abs: 0.9 10*3/uL — ABNORMAL LOW (ref 1.4–6.5)
Neutrophils Relative %: 45 %
PLATELETS: 188 10*3/uL (ref 150–440)
RBC: 2.33 MIL/uL — ABNORMAL LOW (ref 3.80–5.20)
RDW: 23.4 % — AB (ref 11.5–14.5)
WBC: 1.9 10*3/uL — ABNORMAL LOW (ref 3.6–11.0)

## 2017-09-19 MED ORDER — TECHNETIUM TC 99M MEDRONATE IV KIT
23.8960 | PACK | Freq: Once | INTRAVENOUS | Status: AC | PRN
  Administered 2017-09-19: 23.896 via INTRAVENOUS

## 2017-09-20 ENCOUNTER — Other Ambulatory Visit: Payer: Self-pay | Admitting: Oncology

## 2017-09-20 ENCOUNTER — Other Ambulatory Visit: Payer: Self-pay | Admitting: *Deleted

## 2017-09-20 DIAGNOSIS — D649 Anemia, unspecified: Secondary | ICD-10-CM

## 2017-09-20 LAB — PREPARE RBC (CROSSMATCH)

## 2017-09-21 ENCOUNTER — Inpatient Hospital Stay: Payer: Medicare Other

## 2017-09-21 VITALS — BP 127/77 | HR 84 | Temp 96.6°F | Resp 17

## 2017-09-21 DIAGNOSIS — G620 Drug-induced polyneuropathy: Secondary | ICD-10-CM | POA: Diagnosis not present

## 2017-09-21 DIAGNOSIS — D649 Anemia, unspecified: Secondary | ICD-10-CM | POA: Diagnosis not present

## 2017-09-21 DIAGNOSIS — K769 Liver disease, unspecified: Secondary | ICD-10-CM | POA: Diagnosis not present

## 2017-09-21 DIAGNOSIS — D72819 Decreased white blood cell count, unspecified: Secondary | ICD-10-CM | POA: Diagnosis not present

## 2017-09-21 DIAGNOSIS — C50912 Malignant neoplasm of unspecified site of left female breast: Secondary | ICD-10-CM | POA: Diagnosis not present

## 2017-09-21 DIAGNOSIS — C7951 Secondary malignant neoplasm of bone: Secondary | ICD-10-CM | POA: Diagnosis not present

## 2017-09-21 MED ORDER — SODIUM CHLORIDE 0.9% FLUSH
10.0000 mL | INTRAVENOUS | Status: DC | PRN
  Administered 2017-09-21: 10 mL via INTRAVENOUS
  Filled 2017-09-21: qty 10

## 2017-09-21 MED ORDER — ACETAMINOPHEN 325 MG PO TABS
650.0000 mg | ORAL_TABLET | Freq: Once | ORAL | Status: AC
  Administered 2017-09-21: 650 mg via ORAL

## 2017-09-21 MED ORDER — DIPHENHYDRAMINE HCL 25 MG PO CAPS
25.0000 mg | ORAL_CAPSULE | Freq: Once | ORAL | Status: AC
  Administered 2017-09-21: 25 mg via ORAL

## 2017-09-21 MED ORDER — HEPARIN SOD (PORK) LOCK FLUSH 100 UNIT/ML IV SOLN
500.0000 [IU] | Freq: Once | INTRAVENOUS | Status: AC
  Administered 2017-09-21: 500 [IU] via INTRAVENOUS

## 2017-09-21 MED ORDER — SODIUM CHLORIDE 0.9% IV SOLUTION
250.0000 mL | Freq: Once | INTRAVENOUS | Status: AC
  Administered 2017-09-21: 250 mL via INTRAVENOUS
  Filled 2017-09-21: qty 250

## 2017-09-21 NOTE — Patient Instructions (Signed)

## 2017-09-22 LAB — TYPE AND SCREEN
ABO/RH(D): O POS
Antibody Screen: NEGATIVE
UNIT DIVISION: 0

## 2017-09-22 LAB — BPAM RBC
Blood Product Expiration Date: 201909292359
ISSUE DATE / TIME: 201909061017
Unit Type and Rh: 5100

## 2017-09-24 ENCOUNTER — Ambulatory Visit
Admission: RE | Admit: 2017-09-24 | Discharge: 2017-09-24 | Disposition: A | Discharge status: Home / Self Care | Payer: Medicare Other | Source: Ambulatory Visit | Attending: Oncology | Admitting: Oncology

## 2017-09-24 DIAGNOSIS — R918 Other nonspecific abnormal finding of lung field: Secondary | ICD-10-CM | POA: Insufficient documentation

## 2017-09-24 DIAGNOSIS — I2510 Atherosclerotic heart disease of native coronary artery without angina pectoris: Secondary | ICD-10-CM | POA: Diagnosis not present

## 2017-09-24 DIAGNOSIS — K769 Liver disease, unspecified: Secondary | ICD-10-CM | POA: Insufficient documentation

## 2017-09-24 DIAGNOSIS — C7951 Secondary malignant neoplasm of bone: Secondary | ICD-10-CM | POA: Insufficient documentation

## 2017-09-24 DIAGNOSIS — I517 Cardiomegaly: Secondary | ICD-10-CM | POA: Diagnosis not present

## 2017-09-24 DIAGNOSIS — C50919 Malignant neoplasm of unspecified site of unspecified female breast: Secondary | ICD-10-CM | POA: Diagnosis not present

## 2017-09-24 DIAGNOSIS — N6489 Other specified disorders of breast: Secondary | ICD-10-CM | POA: Insufficient documentation

## 2017-09-24 DIAGNOSIS — R933 Abnormal findings on diagnostic imaging of other parts of digestive tract: Secondary | ICD-10-CM | POA: Insufficient documentation

## 2017-09-24 DIAGNOSIS — I700 Atherosclerosis of aorta: Secondary | ICD-10-CM | POA: Insufficient documentation

## 2017-09-24 DIAGNOSIS — C50912 Malignant neoplasm of unspecified site of left female breast: Secondary | ICD-10-CM | POA: Diagnosis not present

## 2017-09-24 DIAGNOSIS — N200 Calculus of kidney: Secondary | ICD-10-CM | POA: Diagnosis not present

## 2017-09-24 MED ORDER — IOPAMIDOL (ISOVUE-300) INJECTION 61%
100.0000 mL | Freq: Once | INTRAVENOUS | Status: AC | PRN
  Administered 2017-09-24: 100 mL via INTRAVENOUS

## 2017-09-24 NOTE — Progress Notes (Signed)
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 Now with biopsy-proven stage IV disease.  INTERVAL HISTORY: Patient returns to clinic today for further evaluation, discussion of her imaging results, and continuation of Ibrance and Zometa.  She has chronic weakness and fatigue, which seems to be worse over the past several weeks. Her neuropathy is unchanged.  She has no other neurologic complaints. She denies any recent fevers or illnesses.  She denies any chest pain, cough, hemoptysis, or shortness of breath.  She has a poor appetite.  She has noted increased nausea, but denies any vomiting, constipation, or diarrhea.  She has no urinary complaints.  Patient offers no further specific complaints today.  REVIEW OF SYSTEMS:   Review of Systems  Constitutional: Positive for malaise/fatigue. Negative for fever and weight loss.  Eyes: Negative.  Negative for blurred vision, double vision and pain.  Respiratory: Negative.  Negative for cough and shortness of breath.   Cardiovascular: Negative.  Negative for chest pain and leg swelling.  Gastrointestinal: Negative.  Negative for abdominal pain.  Genitourinary: Negative.   Musculoskeletal: Positive for back pain. Negative for falls.  Skin: Negative.  Negative for rash.  Neurological: Positive for tingling, sensory change and weakness. Negative for focal weakness and headaches.  Endo/Heme/Allergies: Negative.   Psychiatric/Behavioral: Negative.  Negative for memory loss. The patient is not nervous/anxious.     As per HPI. Otherwise, a complete review of systems is negative.  PAST MEDICAL  HISTORY: Past Medical History:  Diagnosis Date  . Back pain    occasionally  . Breast cancer ([place]) 2009   left  . Depression    takes Paxil and Wellbutrin daily  . Diabetes ([place])    takes Metformin daily  . Genetic testing 02/03/2017   Multi-Cancer panel (83 genes) @ [facility] - No pathogenic mutations detected  . GERD (gastroesophageal reflux disease)   . History of bronchitis 2 yrs ago  . Hyperlipidemia    takes Atorvastatin daily  . Hypertension    takes Losartan-HCTZ daily  . Insomnia    takes Ambien nightly  . Insomnia    takes gabapentin nightly  . Joint pain   . Migraine   . Mood swings   . Osteoarthritis of knee   . Personal history of chemotherapy 12/05/2016   Mets from Breast Cancer  . Personal history of radiation therapy 11/2016  . PONV (postoperative nausea and vomiting)   . Seasonal allergies    takes Allegra daily    PAST SURGICAL HISTORY: Past Surgical History:  Procedure Laterality Date  . BREAST BIOPSY  2009  . cataract surgery Bilateral   . COLONOSCOPY    . JOINT REPLACEMENT Left 2014   knee  . KNEE ARTHROSCOPY Left    x 5  . MASTECTOMY Left   . port a cath placed    . PORTACATH PLACEMENT N/A 09/17/2015   Procedure: INSERTION PORT-A-CATH;  Surgeon: Jules Husbands, MD;  Location: [facility];  Service: General;  Laterality: N/A;  . TOTAL SHOULDER ARTHROPLASTY Right 06/03/2015   Procedure: TOTAL SHOULDER ARTHROPLASTY;  Surgeon: Tania Ade, MD;  Location: [facility];  Service: Orthopedics;  Laterality: Right;  Right total shoulder arthroplasty  . TOTAL SHOULDER  REPLACEMENT Right 06/03/2015  . TUBAL LIGATION      FAMILY HISTORY: Father with non-Hodgkin's lymphoma, 2 paternal aunts with breast cancer.     ADVANCED DIRECTIVES:    HEALTH MAINTENANCE: Social History   Tobacco Use  . Smoking status: Never Smoker  . Smokeless tobacco: Never Used  Substance Use Topics  . Alcohol use: Yes    Alcohol/week: 0.0 standard drinks    Comment: occasionally  wine  . Drug use: Yes    Types: Marijuana    Comment: cannabis with no extra     Colonoscopy:  PAP:  Bone density:  Lipid panel:  Allergies  Allergen Reactions  . Ace Inhibitors Cough  . Latex Itching  . Morphine And Related Itching    Caused her to itch terribly. Would prefer if given to take with a benadryl  . Other Itching    Freeze spray. Patient stated that she may be able to use it now because she doesn't use it as often.  Marland Kitchen Penicillins Rash    Has patient had a PCN reaction causing immediate rash, facial/tongue/throat swelling, SOB or lightheadedness with hypotension: No Has patient had a PCN reaction causing severe rash involving mucus membranes or skin necrosis: No Has patient had a PCN reaction that required hospitalization No Has patient had a PCN reaction occurring within the last 10 years: No If all of the above answers are "NO", then may proceed with Cephalosporin use.    Current Outpatient Medications  Medication Sig Dispense Refill  . acetaminophen (TYLENOL) 500 MG tablet Take 500 mg by mouth every 6 (six) hours as needed.    Marland Kitchen atorvastatin (LIPITOR) 10 MG tablet Take 10 mg by mouth daily at 6 PM.     . buPROPion (WELLBUTRIN XL) 150 MG 24 hr tablet Take 150 mg by mouth every morning.     . Calcium-Magnesium-Vitamin D (CALCIUM 1200+D3 PO) Take 1 Dose by mouth daily.    . Cyanocobalamin 5000 MCG CAPS Take 5,000 mcg by mouth daily.     . ferrous sulfate 325 (65 FE) MG EC tablet Take 325 mg by mouth daily with breakfast. 65 mg elemental    . fexofenadine (ALLEGRA) 180 MG tablet Take 180 mg by mouth at bedtime.     . gabapentin (NEURONTIN) 300 MG capsule Take 300 mg by mouth. 2 pills in am,2 pills at noon and 3 pills at night    . letrozole (FEMARA) 2.5 MG tablet Take 1 tablet (2.5 mg total) by mouth daily. 90 tablet 3  . lidocaine-prilocaine (EMLA) cream Apply 1 application topically as needed. Apply to port 1-2 hours prior to chemotherapy appointment. Cover with  plastic wrap. 30 g 0  . metFORMIN (GLUCOPHAGE) 850 MG tablet Take 850 mg by mouth 2 (two) times daily with a meal.     . palbociclib (IBRANCE) 75 MG capsule Take 1 capsule (75 mg total) by mouth daily with breakfast. Take for 14 days, then 14 days off. 14 capsule 5  . pantoprazole (PROTONIX) 40 MG tablet Take 40 mg by mouth daily.    Marland Kitchen PARoxetine (PAXIL) 40 MG tablet Take 40 mg by mouth every morning.     . prochlorperazine (COMPAZINE) 10 MG tablet Take 1 tablet (10 mg total) by mouth every 6 (six) hours as needed for nausea or vomiting. 30 tablet 3  . Zolpidem Tartrate (AMBIEN PO) Take 10 mg by mouth at bedtime as needed.     Marland Kitchen dexamethasone (DECADRON) 4 MG tablet Take 1 tablet (  4 mg total) by mouth daily. 30 tablet 0  . ibuprofen (ADVIL,MOTRIN) 800 MG tablet Take 1 tablet (800 mg total) every 8 (eight) hours as needed by mouth for mild pain. (Patient not taking: Reported on 09/26/2017) 30 tablet 0   Current Facility-Administered Medications  Medication Dose Route Frequency Provider Last Rate Last Dose  . guaiFENesin-dextromethorphan (ROBITUSSIN DM) 100-10 MG/5ML syrup 5 mL  5 mL Oral Once Lloyd Huger, MD        OBJECTIVE: Vitals:   09/26/17 1336  BP: 125/84  Pulse: 77  Resp: 18  Temp: (!) 95.9 F (35.5 C)     Body mass index is 32.96 kg/m.    ECOG FS:1 - Symptomatic but completely ambulatory  General: Well-developed, well-nourished, no acute distress. Eyes: Pink conjunctiva, anicteric sclera. HEENT: Normocephalic, moist mucous membranes. Breast: Left chest wall without evidence of recurrence.  Exam deferred today. Lungs: Clear to auscultation bilaterally. Heart: Regular rate and rhythm. No rubs, murmurs, or gallops. Abdomen: Soft, nontender, nondistended. No organomegaly noted, normoactive bowel sounds. Musculoskeletal: No edema, cyanosis, or clubbing. Neuro: Alert, answering all questions appropriately. Cranial nerves grossly intact. Skin: No rashes or petechiae  noted. Psych: Normal affect.  LAB RESULTS:  Lab Results  Component Value Date   NA 138 09/26/2017   K 4.5 09/26/2017   CL 109 09/26/2017   CO2 21 (L) 09/26/2017   GLUCOSE 126 (H) 09/26/2017   BUN 22 09/26/2017   CREATININE 1.21 (H) 09/26/2017   CALCIUM 8.9 09/26/2017   PROT 6.4 (L) 09/26/2017   ALBUMIN 3.7 09/26/2017   AST 65 (H) 09/26/2017   ALT 25 09/26/2017   ALKPHOS 75 09/26/2017   BILITOT 0.4 09/26/2017   GFRNONAA 46 (L) 09/26/2017   GFRAA 53 (L) 09/26/2017    Lab Results  Component Value Date   WBC 2.8 (L) 09/26/2017   NEUTROABS 1.6 09/26/2017   HGB 9.2 (L) 09/26/2017   HCT 28.0 (L) 09/26/2017   MCV 97.2 09/26/2017   PLT 264 09/26/2017     STUDIES: Ct Chest W Contrast  Result Date: 09/24/2017 CLINICAL DATA:  Restaging of left breast cancer EXAM: CT CHEST, ABDOMEN, AND PELVIS WITH CONTRAST TECHNIQUE: Multidetector CT imaging of the chest, abdomen and pelvis was performed following the standard protocol during bolus administration of intravenous contrast. CONTRAST:  167m ISOVUE-300 IOPAMIDOL (ISOVUE-300) INJECTION 61% COMPARISON:  Multiple exams, including 02/22/2017 FINDINGS: CT CHEST FINDINGS Cardiovascular: Atherosclerotic calcification of the aortic arch and of the left anterior descending coronary artery. Mild cardiomegaly with left heart enlargement. Mediastinum/Nodes: Contrast medium in the distal esophagus from reflux or dysmotility. Lungs/Pleura: New indistinctly marginated 1.3 by 1.0 cm sub solid pulmonary nodule anteriorly in the right upper lobe on image 35/3. Additional ground-glass densities present scattered in both lungs, on balance similar in overall magnitude to prior exam, with clearing of some areas but worsening of others. The opacities favor the lung bases. No new solid pulmonary nodules. Musculoskeletal: Prosthetic right proximal humerus. Widespread primarily sclerotic osseous metastatic disease is mildly more sclerotic but otherwise roughly similar  in distribution compared to the 02/22/2017 exam. Stable appearance of a fluid density collection superficial to the left pectoralis muscle in the left breast. CT ABDOMEN PELVIS FINDINGS Hepatobiliary: A new hypodense 1.5 by 1.0 cm lesion is present in segment 5 of the liver on image 56/2. 3 by 4 mm right hepatic lobe hypodense lesion on image 52/2 is nonspecific. In the dome of segment 2 a 5 mm hypodense lesion is technically nonspecific although  not well seen on the prior exam. Mildly contracted gallbladder. Pancreas: Unremarkable Spleen: 0.3 by 1.1 cm focus of accentuated enhancement in the spleen on image 48/2 is stable back through 2017 and may represent a hemangioma or similar benign lesion given the lack of progression. Adrenals/Urinary Tract: Adrenal glands normal. Left peripelvic cysts. 2 mm right kidney lower pole calculus, image 77/4. Stomach/Bowel: Borderline wall thickening in proximal loops of jejunum for example image 59/2 could reflect mild proximal enteritis. Vascular/Lymphatic: Aortoiliac atherosclerotic vascular disease. No pathologic adenopathy. Reproductive: Unremarkable Other: No supplemental non-categorized findings. Musculoskeletal: Roughly similar distribution of metastatic disease in the lumbar spine. Some lesions appear mildly larger than on 02/22/2017, for example a sclerotic right iliac lesion on image 86/2 which measures 1.4 by 0.9 cm (formerly 0.8 by 0.7 cm). IMPRESSION: 1. Mild progression of the osseous metastatic disease compared to 02/22/2017. 2. New 1.5 by 1.0 cm right hepatic lobe hypodense lesion concerning for metastatic disease. There are 2 other smaller hypodense lesions in the liver which are technically nonspecific. 3. Scattered ground-glass opacities in both lungs, much of which is likely inflammatory for example from extrinsic allergic alveolitis, or from drug reaction. Surveillance likely warranted. 4. Mild wall thickening in the proximal small bowel may reflect mild  proximal enteritis. 5. Other imaging findings of potential clinical significance: Aortic Atherosclerosis (ICD10-I70.0). Coronary atherosclerosis. Mild cardiomegaly. Esophageal reflux or dysmotility. Stable fluid density lesion in the left breast. Nonobstructive right nephrolithiasis. Probable hemangioma in the spleen. Electronically Signed   By: Van Clines M.D.   On: 09/24/2017 12:56   Nm Bone Scan Whole Body  Result Date: 09/20/2017 CLINICAL DATA:  Metastatic breast cancer, follow-up EXAM: NUCLEAR MEDICINE WHOLE BODY BONE SCAN TECHNIQUE: Whole body anterior and posterior images were obtained approximately 3 hours after intravenous injection of radiopharmaceutical. RADIOPHARMACEUTICALS:  23.96 mCi Technetium-79mMDP IV COMPARISON:  09/21/2016 Correlation: CT chest abdomen 02/22/2017 FINDINGS: Multiple sites of abnormal osseous tracer accumulation consistent with osseous metastatic disease. These sites include calvarium, proximal RIGHT humerus, anterior and posterior ribs bilaterally, cervical, thoracic, and lumbar spine, pelvis, and proximal femora. When compared to the previous exam, new sites of metastatic disease are seen within the spine and ribs as well as pelvis. Photopenic defect from LEFT knee prosthesis with mild surrounding tracer accumulation unchanged. Expected urinary tract and soft tissue distribution of tracer. IMPRESSION: Progressive osseous metastatic disease. Electronically Signed   By: MLavonia DanaM.D.   On: 09/20/2017 09:03   Ct Abdomen Pelvis W Contrast  Result Date: 09/24/2017 CLINICAL DATA:  Restaging of left breast cancer EXAM: CT CHEST, ABDOMEN, AND PELVIS WITH CONTRAST TECHNIQUE: Multidetector CT imaging of the chest, abdomen and pelvis was performed following the standard protocol during bolus administration of intravenous contrast. CONTRAST:  1059mISOVUE-300 IOPAMIDOL (ISOVUE-300) INJECTION 61% COMPARISON:  Multiple exams, including 02/22/2017 FINDINGS: CT CHEST FINDINGS  Cardiovascular: Atherosclerotic calcification of the aortic arch and of the left anterior descending coronary artery. Mild cardiomegaly with left heart enlargement. Mediastinum/Nodes: Contrast medium in the distal esophagus from reflux or dysmotility. Lungs/Pleura: New indistinctly marginated 1.3 by 1.0 cm sub solid pulmonary nodule anteriorly in the right upper lobe on image 35/3. Additional ground-glass densities present scattered in both lungs, on balance similar in overall magnitude to prior exam, with clearing of some areas but worsening of others. The opacities favor the lung bases. No new solid pulmonary nodules. Musculoskeletal: Prosthetic right proximal humerus. Widespread primarily sclerotic osseous metastatic disease is mildly more sclerotic but otherwise roughly similar in distribution compared  to the 02/22/2017 exam. Stable appearance of a fluid density collection superficial to the left pectoralis muscle in the left breast. CT ABDOMEN PELVIS FINDINGS Hepatobiliary: A new hypodense 1.5 by 1.0 cm lesion is present in segment 5 of the liver on image 56/2. 3 by 4 mm right hepatic lobe hypodense lesion on image 52/2 is nonspecific. In the dome of segment 2 a 5 mm hypodense lesion is technically nonspecific although not well seen on the prior exam. Mildly contracted gallbladder. Pancreas: Unremarkable Spleen: 0.3 by 1.1 cm focus of accentuated enhancement in the spleen on image 48/2 is stable back through 2017 and may represent a hemangioma or similar benign lesion given the lack of progression. Adrenals/Urinary Tract: Adrenal glands normal. Left peripelvic cysts. 2 mm right kidney lower pole calculus, image 77/4. Stomach/Bowel: Borderline wall thickening in proximal loops of jejunum for example image 59/2 could reflect mild proximal enteritis. Vascular/Lymphatic: Aortoiliac atherosclerotic vascular disease. No pathologic adenopathy. Reproductive: Unremarkable Other: No supplemental non-categorized findings.  Musculoskeletal: Roughly similar distribution of metastatic disease in the lumbar spine. Some lesions appear mildly larger than on 02/22/2017, for example a sclerotic right iliac lesion on image 86/2 which measures 1.4 by 0.9 cm (formerly 0.8 by 0.7 cm). IMPRESSION: 1. Mild progression of the osseous metastatic disease compared to 02/22/2017. 2. New 1.5 by 1.0 cm right hepatic lobe hypodense lesion concerning for metastatic disease. There are 2 other smaller hypodense lesions in the liver which are technically nonspecific. 3. Scattered ground-glass opacities in both lungs, much of which is likely inflammatory for example from extrinsic allergic alveolitis, or from drug reaction. Surveillance likely warranted. 4. Mild wall thickening in the proximal small bowel may reflect mild proximal enteritis. 5. Other imaging findings of potential clinical significance: Aortic Atherosclerosis (ICD10-I70.0). Coronary atherosclerosis. Mild cardiomegaly. Esophageal reflux or dysmotility. Stable fluid density lesion in the left breast. Nonobstructive right nephrolithiasis. Probable hemangioma in the spleen. Electronically Signed   By: Van Clines M.D.   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 Now with biopsy proven stage IV disease with lymph node and bone metastasis.  PLAN:    1.  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  Because of this, we will continue with Ibrance 75 mg for 14 days on and 14 days off.  Patient continues to have persistent leukopenia and anemia which is  relatively unchanged.  Proceed with Zometa today.  Return to clinic in 4 weeks for further evaluation and continuation of Zometa.  Will reimage in 3 months in December 2019.   2.  Osteopenia: Patient's bone mineral density on January 26, 2016 reported a T score of -0.9 which is considered normal. Continue calcium and vitamin D.  Zometa as above.   3.  Anemia: Patient's hemoglobin has improved to 9.2 today.  She does not require transfusion today.  4. Peripheral neuropathy: Chronic and unchanged.  Continue gabapentin as prescribed. Neuropathy managed by neurology.  5. Back pain: MRI results from May 04, 2017 did not reveal metastatic disease.  Continue symptomatic treatment.  6.  Left IJ clot: Ultrasound results reviewed independently.  Patient has been instructed  to discontinue Eliquis. 7.  Leukopenia: Patient's white blood cell count is decreased, but essentially stable at 2.8.  Continue with dose reduced Ibrance as above. 8.  Weakness and fatigue/poor appetite/nausea: Likely multifactorial.  Patient was given a prescription for 4 mg dexamethasone daily.  Patient expressed understanding and was in agreement with this plan. She also understands that She can call clinic at any time with any questions, concerns, or complaints.   Breast cancer   Staging form: Breast, AJCC 7th Edition     Pathologic stage from 08/11/2014: Stage IIIA (T0, N2a, cM0) - Signed by Lloyd Huger, MD on 08/11/2014   Lloyd Huger, MD   09/28/2017 6:23 PM

## 2017-09-25 ENCOUNTER — Other Ambulatory Visit: Payer: Self-pay | Admitting: *Deleted

## 2017-09-25 DIAGNOSIS — C50919 Malignant neoplasm of unspecified site of unspecified female breast: Secondary | ICD-10-CM

## 2017-09-25 NOTE — Progress Notes (Signed)
cc

## 2017-09-26 ENCOUNTER — Inpatient Hospital Stay: Payer: Medicare Other

## 2017-09-26 ENCOUNTER — Inpatient Hospital Stay (HOSPITAL_BASED_OUTPATIENT_CLINIC_OR_DEPARTMENT_OTHER): Payer: Medicare Other | Admitting: Oncology

## 2017-09-26 ENCOUNTER — Encounter: Payer: Self-pay | Admitting: Oncology

## 2017-09-26 ENCOUNTER — Telehealth: Payer: Self-pay | Admitting: Pharmacist

## 2017-09-26 VITALS — BP 125/84 | HR 77 | Temp 95.9°F | Resp 18 | Wt 192.0 lb

## 2017-09-26 DIAGNOSIS — C50912 Malignant neoplasm of unspecified site of left female breast: Secondary | ICD-10-CM | POA: Diagnosis not present

## 2017-09-26 DIAGNOSIS — C50919 Malignant neoplasm of unspecified site of unspecified female breast: Secondary | ICD-10-CM

## 2017-09-26 DIAGNOSIS — R5382 Chronic fatigue, unspecified: Secondary | ICD-10-CM

## 2017-09-26 DIAGNOSIS — D72819 Decreased white blood cell count, unspecified: Secondary | ICD-10-CM | POA: Diagnosis not present

## 2017-09-26 DIAGNOSIS — R110 Nausea: Secondary | ICD-10-CM | POA: Diagnosis not present

## 2017-09-26 DIAGNOSIS — C779 Secondary and unspecified malignant neoplasm of lymph node, unspecified: Secondary | ICD-10-CM | POA: Diagnosis not present

## 2017-09-26 DIAGNOSIS — M8580 Other specified disorders of bone density and structure, unspecified site: Secondary | ICD-10-CM

## 2017-09-26 DIAGNOSIS — Z170 Estrogen receptor positive status [ER+]: Secondary | ICD-10-CM | POA: Diagnosis not present

## 2017-09-26 DIAGNOSIS — D649 Anemia, unspecified: Secondary | ICD-10-CM | POA: Diagnosis not present

## 2017-09-26 DIAGNOSIS — G620 Drug-induced polyneuropathy: Secondary | ICD-10-CM | POA: Diagnosis not present

## 2017-09-26 DIAGNOSIS — R630 Anorexia: Secondary | ICD-10-CM

## 2017-09-26 DIAGNOSIS — K769 Liver disease, unspecified: Secondary | ICD-10-CM | POA: Diagnosis not present

## 2017-09-26 DIAGNOSIS — C7951 Secondary malignant neoplasm of bone: Secondary | ICD-10-CM

## 2017-09-26 DIAGNOSIS — R531 Weakness: Secondary | ICD-10-CM | POA: Diagnosis not present

## 2017-09-26 LAB — CBC WITH DIFFERENTIAL/PLATELET
BASOS ABS: 0.1 10*3/uL (ref 0–0.1)
Basophils Relative: 2 %
Eosinophils Absolute: 0.2 10*3/uL (ref 0–0.7)
Eosinophils Relative: 7 %
HEMATOCRIT: 28 % — AB (ref 35.0–47.0)
HEMOGLOBIN: 9.2 g/dL — AB (ref 12.0–16.0)
LYMPHS PCT: 14 %
Lymphs Abs: 0.4 10*3/uL — ABNORMAL LOW (ref 1.0–3.6)
MCH: 31.9 pg (ref 26.0–34.0)
MCHC: 32.8 g/dL (ref 32.0–36.0)
MCV: 97.2 fL (ref 80.0–100.0)
Monocytes Absolute: 0.6 10*3/uL (ref 0.2–0.9)
Monocytes Relative: 20 %
NEUTROS ABS: 1.6 10*3/uL (ref 1.4–6.5)
Neutrophils Relative %: 57 %
Platelets: 264 10*3/uL (ref 150–440)
RBC: 2.88 MIL/uL — AB (ref 3.80–5.20)
RDW: 21.2 % — ABNORMAL HIGH (ref 11.5–14.5)
WBC: 2.8 10*3/uL — AB (ref 3.6–11.0)

## 2017-09-26 LAB — COMPREHENSIVE METABOLIC PANEL
ALBUMIN: 3.7 g/dL (ref 3.5–5.0)
ALT: 25 U/L (ref 0–44)
AST: 65 U/L — ABNORMAL HIGH (ref 15–41)
Alkaline Phosphatase: 75 U/L (ref 38–126)
Anion gap: 8 (ref 5–15)
BILIRUBIN TOTAL: 0.4 mg/dL (ref 0.3–1.2)
BUN: 22 mg/dL (ref 8–23)
CO2: 21 mmol/L — ABNORMAL LOW (ref 22–32)
Calcium: 8.9 mg/dL (ref 8.9–10.3)
Chloride: 109 mmol/L (ref 98–111)
Creatinine, Ser: 1.21 mg/dL — ABNORMAL HIGH (ref 0.44–1.00)
GFR calc Af Amer: 53 mL/min — ABNORMAL LOW (ref >60)
GFR, EST NON AFRICAN AMERICAN: 46 mL/min — AB (ref >60)
Glucose, Bld: 126 mg/dL — ABNORMAL HIGH (ref 70–99)
POTASSIUM: 4.5 mmol/L (ref 3.5–5.1)
Sodium: 138 mmol/L (ref 135–145)
TOTAL PROTEIN: 6.4 g/dL — AB (ref 6.5–8.1)

## 2017-09-26 MED ORDER — SODIUM CHLORIDE 0.9 % IV SOLN
Freq: Once | INTRAVENOUS | Status: AC
  Administered 2017-09-26: 15:00:00 via INTRAVENOUS
  Filled 2017-09-26: qty 250

## 2017-09-26 MED ORDER — HEPARIN SOD (PORK) LOCK FLUSH 100 UNIT/ML IV SOLN
500.0000 [IU] | Freq: Once | INTRAVENOUS | Status: AC
  Administered 2017-09-26: 500 [IU] via INTRAVENOUS
  Filled 2017-09-26: qty 5

## 2017-09-26 MED ORDER — SODIUM CHLORIDE 0.9% FLUSH
10.0000 mL | Freq: Once | INTRAVENOUS | Status: AC
  Administered 2017-09-26: 10 mL via INTRAVENOUS
  Filled 2017-09-26: qty 10

## 2017-09-26 MED ORDER — ZOLEDRONIC ACID 4 MG/100ML IV SOLN
4.0000 mg | Freq: Once | INTRAVENOUS | Status: AC
  Administered 2017-09-26: 4 mg via INTRAVENOUS
  Filled 2017-09-26: qty 100

## 2017-09-26 MED ORDER — DEXAMETHASONE 4 MG PO TABS: 4.0000 mg | ORAL_TABLET | Freq: Every day | ORAL | 0 refills | Status: DC

## 2017-09-26 NOTE — Telephone Encounter (Signed)
Oral Chemotherapy Pharmacist Encounter  Follow-Up Form  Called patient today to follow up regarding patient's oral chemotherapy medication: Ibrance (palbociclib)  Original Start date of oral chemotherapy: 09/2016  Pt reports 0 tablets/doses of Ibrance missed in the last cycle.   Pt reports the following side effects: fatigue  Recent labs reviewed: CBC from 09/26/17  New medications?: None reported  Other Issues: None reported  Patient knows to call the office with questions or concerns. Oral Oncology Clinic will continue to follow.  Darl Pikes, PharmD, BCPS, [facility] Hematology/Oncology Clinical Pharmacist [facility] [phone]  09/26/2017 4:31 PM

## 2017-09-26 NOTE — Progress Notes (Signed)
Pt in for follow up, reports "not good".  "Very fatigued, poor appetite".

## 2017-09-27 MED ORDER — PALBOCICLIB 75 MG PO CAPS: 75.0000 mg | ORAL_CAPSULE | Freq: Every day | ORAL | 0 refills | Status: DC

## 2017-10-01 MED FILL — IBRANCE 75 MG CAPSULE: 75 | 14 days supply | Qty: 7 | Fill #0

## 2017-10-08 ENCOUNTER — Ambulatory Visit
Admission: RE | Admit: 2017-10-08 | Discharge: 2017-10-08 | Disposition: A | Discharge status: Home / Self Care | Payer: Medicare Other | Source: Ambulatory Visit | Attending: Radiation Oncology | Admitting: Radiation Oncology

## 2017-10-08 ENCOUNTER — Encounter: Payer: Self-pay | Admitting: Radiation Oncology

## 2017-10-08 ENCOUNTER — Other Ambulatory Visit: Payer: Self-pay

## 2017-10-08 ENCOUNTER — Telehealth: Payer: Self-pay | Admitting: *Deleted

## 2017-10-08 DIAGNOSIS — C7951 Secondary malignant neoplasm of bone: Secondary | ICD-10-CM | POA: Diagnosis not present

## 2017-10-08 DIAGNOSIS — Z923 Personal history of irradiation: Secondary | ICD-10-CM | POA: Insufficient documentation

## 2017-10-08 DIAGNOSIS — Z170 Estrogen receptor positive status [ER+]: Secondary | ICD-10-CM | POA: Insufficient documentation

## 2017-10-08 DIAGNOSIS — C50912 Malignant neoplasm of unspecified site of left female breast: Secondary | ICD-10-CM | POA: Diagnosis not present

## 2017-10-08 NOTE — Progress Notes (Signed)
Radiation Oncology old patient new area progressive spinal metastasis Follow up Note  Name: Amanda [name]   Date:   10/08/2017 MRN:  [id number] DOB: 1950/03/17    This 67 y.o. female presents to the clinic today for reevaluation of progressive metastatic breast cancer stage IV.  REFERRING PROVIDER: Sofie Hartigan, MD  HPI: patient is a 67 year old female well-known to department having received multiple courses of palliative radiation therapy to her thoracic lumbar and sacrum in the past. She is currently been on Ibrance and Zometa.She's having progressive mid back pain. Recent bone scan as well as CT scan shows aggressive osteolytic metastatic disease she has significant disease in its fine. She has no troubles with ambulation and no focal neurologic deficits.  COMPLICATIONS OF TREATMENT: none  FOLLOW UP COMPLIANCE: keeps appointments   PHYSICAL EXAM:  BP (!) (P) 151/89 (BP Location: Left Arm, Patient Position: Sitting)   Pulse (P) 87   Temp (!) (P) 96.3 F (35.7 C) (Tympanic)   Resp (P) 18   Wt (P) 194 lb 8.9 oz (88.3 kg)   BMI (P) 33.40 kg/m  Motor sensory and DTR levels are equal and symmetric in the upper lower extremities range of motion of her lower extremities does not elicit pain proprioception is intact.Well-developed well-nourished patient in NAD. HEENT reveals PERLA, EOMI, discs not visualized.  Oral cavity is clear. No oral mucosal lesions are identified. Neck is clear without evidence of cervical or supraclavicular adenopathy. Lungs are clear to A&P. Cardiac examination is essentially unremarkable with regular rate and rhythm without murmur rub or thrill. Abdomen is benign with no organomegaly or masses noted. Motor sensory and DTR levels are equal and symmetric in the upper and lower extremities. Cranial nerves II through XII are grossly intact. Proprioception is intact. No peripheral adenopathy or edema is identified. No motor or sensory levels are noted. Crude visual  fields are within normal range.  RADIOLOGY RESULTS: bone scan and CT scans reviewed  PLAN: at the present time I'd like to perform a simulation on the patient and target the areas between prior treatments of her thoracic and lumbar spine. We'll be better able to analyze areas of under treatment and compare that with recent bone scan and CT scans for delineation of a target region of palliation. Would plan on delivering 3000 cGy in 10 fractions. Risks and benefits of treatment including skin reaction fatigue alteration of blood counts and possible bowel disturbances all were discussed in detail. Patient and husband both comprehend my treatment plan well.  I would like to take this opportunity to thank you for allowing me to participate in the care of your patient.Noreene Filbert, MD

## 2017-10-08 NOTE — Telephone Encounter (Signed)
Left vm for patient

## 2017-10-08 NOTE — Telephone Encounter (Signed)
Patient called to report she missed 2 doses of Ibrance last week while on vacation. She would like to know how to proceed with the rest of this cycle.

## 2017-10-08 NOTE — Telephone Encounter (Signed)
Ok the add the 2 dose on at the end and keep f/u as scheduled.

## 2017-10-09 DIAGNOSIS — E781 Pure hyperglyceridemia: Secondary | ICD-10-CM | POA: Diagnosis not present

## 2017-10-09 DIAGNOSIS — I10 Essential (primary) hypertension: Secondary | ICD-10-CM | POA: Diagnosis not present

## 2017-10-10 ENCOUNTER — Ambulatory Visit
Admission: RE | Admit: 2017-10-10 | Discharge: 2017-10-10 | Disposition: A | Discharge status: Home / Self Care | Payer: Medicare Other | Source: Ambulatory Visit | Attending: Radiation Oncology | Admitting: Radiation Oncology

## 2017-10-10 DIAGNOSIS — C7951 Secondary malignant neoplasm of bone: Secondary | ICD-10-CM | POA: Diagnosis not present

## 2017-10-10 DIAGNOSIS — Z170 Estrogen receptor positive status [ER+]: Secondary | ICD-10-CM | POA: Diagnosis not present

## 2017-10-10 DIAGNOSIS — C50912 Malignant neoplasm of unspecified site of left female breast: Secondary | ICD-10-CM | POA: Diagnosis not present

## 2017-10-10 DIAGNOSIS — Z510 Encounter for antineoplastic radiation therapy: Secondary | ICD-10-CM | POA: Insufficient documentation

## 2017-10-12 DIAGNOSIS — C50912 Malignant neoplasm of unspecified site of left female breast: Secondary | ICD-10-CM | POA: Diagnosis not present

## 2017-10-12 DIAGNOSIS — Z170 Estrogen receptor positive status [ER+]: Secondary | ICD-10-CM | POA: Diagnosis not present

## 2017-10-12 DIAGNOSIS — Z510 Encounter for antineoplastic radiation therapy: Secondary | ICD-10-CM | POA: Diagnosis not present

## 2017-10-12 DIAGNOSIS — C7951 Secondary malignant neoplasm of bone: Secondary | ICD-10-CM | POA: Diagnosis not present

## 2017-10-16 ENCOUNTER — Ambulatory Visit
Admission: RE | Admit: 2017-10-16 | Discharge: 2017-10-16 | Disposition: A | Discharge status: Home / Self Care | Payer: Medicare Other | Source: Ambulatory Visit | Attending: Radiation Oncology | Admitting: Radiation Oncology

## 2017-10-16 DIAGNOSIS — Z510 Encounter for antineoplastic radiation therapy: Secondary | ICD-10-CM | POA: Insufficient documentation

## 2017-10-16 DIAGNOSIS — C50912 Malignant neoplasm of unspecified site of left female breast: Secondary | ICD-10-CM | POA: Diagnosis not present

## 2017-10-16 DIAGNOSIS — C7951 Secondary malignant neoplasm of bone: Secondary | ICD-10-CM | POA: Insufficient documentation

## 2017-10-16 DIAGNOSIS — Z170 Estrogen receptor positive status [ER+]: Secondary | ICD-10-CM | POA: Diagnosis not present

## 2017-10-17 ENCOUNTER — Ambulatory Visit
Admission: RE | Admit: 2017-10-17 | Discharge: 2017-10-17 | Disposition: A | Discharge status: Home / Self Care | Payer: Medicare Other | Source: Ambulatory Visit | Attending: Radiation Oncology | Admitting: Radiation Oncology

## 2017-10-17 DIAGNOSIS — C7951 Secondary malignant neoplasm of bone: Secondary | ICD-10-CM | POA: Diagnosis not present

## 2017-10-17 DIAGNOSIS — Z510 Encounter for antineoplastic radiation therapy: Secondary | ICD-10-CM | POA: Diagnosis not present

## 2017-10-18 ENCOUNTER — Ambulatory Visit
Admission: RE | Admit: 2017-10-18 | Discharge: 2017-10-18 | Disposition: A | Discharge status: Home / Self Care | Payer: Medicare Other | Source: Ambulatory Visit | Attending: Radiation Oncology | Admitting: Radiation Oncology

## 2017-10-18 DIAGNOSIS — C7951 Secondary malignant neoplasm of bone: Secondary | ICD-10-CM | POA: Diagnosis not present

## 2017-10-18 DIAGNOSIS — Z510 Encounter for antineoplastic radiation therapy: Secondary | ICD-10-CM | POA: Diagnosis not present

## 2017-10-19 ENCOUNTER — Ambulatory Visit
Admission: RE | Admit: 2017-10-19 | Discharge: 2017-10-19 | Disposition: A | Discharge status: Home / Self Care | Payer: Medicare Other | Source: Ambulatory Visit | Attending: Radiation Oncology | Admitting: Radiation Oncology

## 2017-10-19 DIAGNOSIS — Z510 Encounter for antineoplastic radiation therapy: Secondary | ICD-10-CM | POA: Diagnosis not present

## 2017-10-19 DIAGNOSIS — C7951 Secondary malignant neoplasm of bone: Secondary | ICD-10-CM | POA: Diagnosis not present

## 2017-10-22 ENCOUNTER — Ambulatory Visit
Admission: RE | Admit: 2017-10-22 | Discharge: 2017-10-22 | Disposition: A | Discharge status: Home / Self Care | Payer: Medicare Other | Source: Ambulatory Visit | Attending: Radiation Oncology | Admitting: Radiation Oncology

## 2017-10-22 DIAGNOSIS — Z510 Encounter for antineoplastic radiation therapy: Secondary | ICD-10-CM | POA: Diagnosis not present

## 2017-10-22 DIAGNOSIS — C7951 Secondary malignant neoplasm of bone: Secondary | ICD-10-CM | POA: Diagnosis not present

## 2017-10-22 NOTE — Progress Notes (Signed)
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  INTERVAL HISTORY: Patient returns to clinic today for further evaluation and continuation of Ibrance and Zometa.  Her weakness and fatigue have significantly improved and she feels nearly back to her baseline. She continues to have a chronic peripheral neuropathy, but no other neurologic complaints.  She denies any recent fevers or illnesses.  She denies any chest pain, cough, hemoptysis, or shortness of breath.  Her appetite has significantly improved.  She denies any nausea, vomiting, constipation, or diarrhea. She has no urinary complaints.  Patient offers no specific complaints today.  REVIEW OF SYSTEMS:   Review of Systems  Constitutional: Negative.  Negative for fever, malaise/fatigue and weight loss.  Eyes: Negative.  Negative for blurred vision, double vision and pain.  Respiratory: Negative.  Negative for cough and shortness of breath.   Cardiovascular: Negative.  Negative for chest pain and leg swelling.  Gastrointestinal: Negative.  Negative for abdominal pain.  Genitourinary: Negative.  Negative for dysuria.  Musculoskeletal: Positive for back pain. Negative for falls.  Skin: Negative.  Negative for rash.  Neurological: Positive for tingling and sensory change. Negative for focal weakness, weakness and headaches.  Endo/Heme/Allergies: Negative.   Psychiatric/Behavioral: Negative.  Negative for memory loss. The patient is not nervous/anxious.     As per HPI. Otherwise, a complete review of systems is negative.  PAST MEDICAL HISTORY: Past  Medical History:  Diagnosis Date  . Back pain    occasionally  . Breast cancer ([place]) 2009   left  . Depression    takes Paxil and Wellbutrin daily  . Diabetes ([place])    takes Metformin daily  . Genetic testing 02/03/2017   Multi-Cancer panel (83 genes) @ [facility] - No pathogenic mutations detected  . GERD (gastroesophageal reflux disease)   . History of bronchitis 2 yrs ago  . Hyperlipidemia    takes Atorvastatin daily  . Hypertension    takes Losartan-HCTZ daily  . Insomnia    takes Ambien nightly  . Insomnia    takes gabapentin nightly  . Joint pain   . Migraine   . Mood swings   . Osteoarthritis of knee   . Personal history of chemotherapy 12/05/2016   Mets from Breast Cancer  . Personal history of radiation therapy 11/2016  . PONV (postoperative nausea and vomiting)   . Seasonal allergies    takes Allegra daily    PAST SURGICAL HISTORY: Past Surgical History:  Procedure Laterality Date  . BREAST BIOPSY  2009  . cataract surgery Bilateral   . COLONOSCOPY    . JOINT REPLACEMENT Left 2014   knee  . KNEE ARTHROSCOPY Left    x 5  . MASTECTOMY Left   . port a cath placed    . PORTACATH PLACEMENT N/A 09/17/2015   Procedure: INSERTION PORT-A-CATH;  Surgeon: Jules Husbands, MD;  Location: [facility];  Service: General;  Laterality: N/A;  . TOTAL SHOULDER ARTHROPLASTY Right 06/03/2015   Procedure: TOTAL SHOULDER ARTHROPLASTY;  Surgeon: Tania Ade, MD;  Location: [place];  Service: Orthopedics;  Laterality: Right;  Right total shoulder arthroplasty  . TOTAL SHOULDER REPLACEMENT Right 06/03/2015  . TUBAL  LIGATION      FAMILY HISTORY: Father with non-Hodgkin's lymphoma, 2 paternal aunts with breast cancer.     ADVANCED DIRECTIVES:    HEALTH MAINTENANCE: Social History   Tobacco Use  . Smoking status: Never Smoker  . Smokeless tobacco: Never Used  Substance Use Topics  . Alcohol use: Yes    Alcohol/week: 0.0 standard drinks    Comment: occasionally wine  . Drug  use: Yes    Types: Marijuana    Comment: cannabis with no extra     Colonoscopy:  PAP:  Bone density:  Lipid panel:  Allergies  Allergen Reactions  . Ace Inhibitors Cough  . Latex Itching  . Morphine And Related Itching    Caused her to itch terribly. Would prefer if given to take with a benadryl  . Other Itching    Freeze spray. Patient stated that she may be able to use it now because she doesn't use it as often.  Marland Kitchen Penicillins Rash    Has patient had a PCN reaction causing immediate rash, facial/tongue/throat swelling, SOB or lightheadedness with hypotension: No Has patient had a PCN reaction causing severe rash involving mucus membranes or skin necrosis: No Has patient had a PCN reaction that required hospitalization No Has patient had a PCN reaction occurring within the last 10 years: No If all of the above answers are "NO", then may proceed with Cephalosporin use.    Current Outpatient Medications  Medication Sig Dispense Refill  . acetaminophen (TYLENOL) 500 MG tablet Take 500 mg by mouth every 6 (six) hours as needed.    Marland Kitchen atorvastatin (LIPITOR) 10 MG tablet Take 10 mg by mouth daily at 6 PM.     . buPROPion (WELLBUTRIN XL) 150 MG 24 hr tablet Take 150 mg by mouth every morning.     . Calcium-Magnesium-Vitamin D (CALCIUM 1200+D3 PO) Take 1 Dose by mouth daily.    . Cyanocobalamin 5000 MCG CAPS Take 5,000 mcg by mouth daily.     Marland Kitchen dexamethasone (DECADRON) 4 MG tablet Take 1 tablet (4 mg total) by mouth every other day. 15 tablet 3  . ferrous sulfate 325 (65 FE) MG EC tablet Take 325 mg by mouth daily with breakfast. 65 mg elemental    . fexofenadine (ALLEGRA) 180 MG tablet Take 180 mg by mouth at bedtime.     . gabapentin (NEURONTIN) 300 MG capsule Take 300 mg by mouth. 2 pills in am,2 pills at noon and 3 pills at night    . ibuprofen (ADVIL,MOTRIN) 800 MG tablet Take 1 tablet (800 mg total) every 8 (eight) hours as needed by mouth for mild pain. 30 tablet 0  . letrozole  (FEMARA) 2.5 MG tablet Take 1 tablet (2.5 mg total) by mouth daily. 90 tablet 3  . lidocaine-prilocaine (EMLA) cream Apply 1 application topically as needed. Apply to port 1-2 hours prior to chemotherapy appointment. Cover with plastic wrap. 30 g 0  . metFORMIN (GLUCOPHAGE) 850 MG tablet Take 850 mg by mouth 2 (two) times daily with a meal.     . palbociclib (IBRANCE) 75 MG capsule Take 1 capsule (75 mg total) by mouth daily with breakfast. Take for 14 days, then 14 days off. 14 capsule 5  . pantoprazole (PROTONIX) 40 MG tablet Take 40 mg by mouth daily.    Marland Kitchen PARoxetine (PAXIL) 40 MG tablet Take 40 mg by mouth every morning.     . prochlorperazine (COMPAZINE) 10 MG tablet Take 1 tablet (10 mg total) by  mouth every 6 (six) hours as needed for nausea or vomiting. 30 tablet 3  . Zolpidem Tartrate (AMBIEN PO) Take 10 mg by mouth at bedtime as needed.      Current Facility-Administered Medications  Medication Dose Route Frequency Provider Last Rate Last Dose  . guaiFENesin-dextromethorphan (ROBITUSSIN DM) 100-10 MG/5ML syrup 5 mL  5 mL Oral Once Lloyd Huger, MD        OBJECTIVE: Vitals:   10/24/17 1109  BP: (!) 148/74  Pulse: 80  Resp: 18  Temp: (!) 97.1 F (36.2 C)     Body mass index is 33.55 kg/m.    ECOG FS:0 - Asymptomatic  General: Well-developed, well-nourished, no acute distress. Eyes: Pink conjunctiva, anicteric sclera. HEENT: Normocephalic, moist mucous membranes. Breast: Left mastectomy.  Exam deferred today. Lungs: Clear to auscultation bilaterally. Heart: Regular rate and rhythm. No rubs, murmurs, or gallops. Abdomen: Soft, nontender, nondistended. No organomegaly noted, normoactive bowel sounds. Musculoskeletal: No edema, cyanosis, or clubbing. Neuro: Alert, answering all questions appropriately. Cranial nerves grossly intact. Skin: No rashes or petechiae noted. Psych: Normal affect.  LAB RESULTS:  Lab Results  Component Value Date   NA 138 10/24/2017   K  5.1 10/24/2017   CL 102 10/24/2017   CO2 26 10/24/2017   GLUCOSE 145 (H) 10/24/2017   BUN 30 (H) 10/24/2017   CREATININE 1.17 (H) 10/24/2017   CALCIUM 9.6 10/24/2017   PROT 6.3 (L) 10/24/2017   ALBUMIN 3.5 10/24/2017   AST 74 (H) 10/24/2017   ALT 69 (H) 10/24/2017   ALKPHOS 84 10/24/2017   BILITOT 0.5 10/24/2017   GFRNONAA 47 (L) 10/24/2017   GFRAA 55 (L) 10/24/2017    Lab Results  Component Value Date   WBC 4.0 10/24/2017   NEUTROABS 3.0 10/24/2017   HGB 10.3 (L) 10/24/2017   HCT 32.9 (L) 10/24/2017   MCV 99.1 10/24/2017   PLT 205 10/24/2017     STUDIES: No results found.  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 Now with biopsy proven stage IV disease with lymph node and bone metastasis.  PLAN:    1.  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  Because of this, Ibrance 75 mg for 14 days on and 14 days off was continued.  Patient's persistent leukopenia and thrombocytopenia have resolved.  She continues to have a mild anemia which is also improving.  Continue Ibrance as prescribed.  Proceed with Zometa today.  Return to clinic in 4 weeks for further evaluation and continuation of Zometa.  Plan to reimage in approximately December 2019.   2.  Osteopenia: Patient's bone mineral density on January 26, 2016 reported a T score of -0.9 which is considered normal. Continue calcium and vitamin D.  Zometa as above.   3.  Anemia: Patient's hemoglobin is trending up and is now 10.3.  She does not require transfusion today.  4. Peripheral neuropathy: Chronic and unchanged.  Continue gabapentin as  prescribed. Neuropathy managed by neurology.  5. Back pain: MRI results from May 04, 2017 did not reveal metastatic disease.  Continue symptomatic treatment.  6.  Left IJ clot: Ultrasound results reviewed independently.  Patient has been instructed to discontinue  Eliquis. 7.  Leukopenia: Resolved.  Continue with dose reduced Ibrance as above. 8.  Weakness and fatigue/poor appetite/nausea: Significantly improved.  Continue dexamethasone but decrease dose to 4 mg every other day.   Patient expressed understanding and was in agreement with this plan. She also understands that She can call clinic at any time with any questions, concerns, or complaints.   Breast cancer   Staging form: Breast, AJCC 7th Edition     Pathologic stage from 08/11/2014: Stage IIIA (T0, N2a, cM0) - Signed by Lloyd Huger, MD on 08/11/2014   Lloyd Huger, MD   10/26/2017 12:34 PM

## 2017-10-23 ENCOUNTER — Ambulatory Visit
Admission: RE | Admit: 2017-10-23 | Discharge: 2017-10-23 | Disposition: A | Discharge status: Home / Self Care | Payer: Medicare Other | Source: Ambulatory Visit | Attending: Radiation Oncology | Admitting: Radiation Oncology

## 2017-10-23 DIAGNOSIS — Z510 Encounter for antineoplastic radiation therapy: Secondary | ICD-10-CM | POA: Diagnosis not present

## 2017-10-23 DIAGNOSIS — C7951 Secondary malignant neoplasm of bone: Secondary | ICD-10-CM | POA: Diagnosis not present

## 2017-10-23 DIAGNOSIS — C50912 Malignant neoplasm of unspecified site of left female breast: Secondary | ICD-10-CM | POA: Diagnosis not present

## 2017-10-23 DIAGNOSIS — Z170 Estrogen receptor positive status [ER+]: Secondary | ICD-10-CM | POA: Diagnosis not present

## 2017-10-24 ENCOUNTER — Inpatient Hospital Stay (HOSPITAL_BASED_OUTPATIENT_CLINIC_OR_DEPARTMENT_OTHER): Payer: Medicare Other | Admitting: Oncology

## 2017-10-24 ENCOUNTER — Ambulatory Visit
Admission: RE | Admit: 2017-10-24 | Discharge: 2017-10-24 | Disposition: A | Discharge status: Home / Self Care | Payer: Medicare Other | Source: Ambulatory Visit | Attending: Radiation Oncology | Admitting: Radiation Oncology

## 2017-10-24 ENCOUNTER — Encounter: Payer: Self-pay | Admitting: Oncology

## 2017-10-24 ENCOUNTER — Inpatient Hospital Stay: Payer: Medicare Other | Attending: Oncology

## 2017-10-24 ENCOUNTER — Inpatient Hospital Stay: Payer: Medicare Other

## 2017-10-24 VITALS — BP 148/74 | HR 80 | Temp 97.1°F | Resp 18 | Wt 195.4 lb

## 2017-10-24 DIAGNOSIS — C7951 Secondary malignant neoplasm of bone: Secondary | ICD-10-CM | POA: Insufficient documentation

## 2017-10-24 DIAGNOSIS — C50912 Malignant neoplasm of unspecified site of left female breast: Secondary | ICD-10-CM | POA: Diagnosis not present

## 2017-10-24 DIAGNOSIS — Z23 Encounter for immunization: Secondary | ICD-10-CM | POA: Diagnosis not present

## 2017-10-24 DIAGNOSIS — C50919 Malignant neoplasm of unspecified site of unspecified female breast: Secondary | ICD-10-CM

## 2017-10-24 DIAGNOSIS — C779 Secondary and unspecified malignant neoplasm of lymph node, unspecified: Secondary | ICD-10-CM | POA: Diagnosis not present

## 2017-10-24 DIAGNOSIS — D649 Anemia, unspecified: Secondary | ICD-10-CM

## 2017-10-24 DIAGNOSIS — K769 Liver disease, unspecified: Secondary | ICD-10-CM

## 2017-10-24 DIAGNOSIS — M8580 Other specified disorders of bone density and structure, unspecified site: Secondary | ICD-10-CM | POA: Diagnosis not present

## 2017-10-24 DIAGNOSIS — Z79899 Other long term (current) drug therapy: Secondary | ICD-10-CM | POA: Insufficient documentation

## 2017-10-24 DIAGNOSIS — G620 Drug-induced polyneuropathy: Secondary | ICD-10-CM | POA: Diagnosis not present

## 2017-10-24 DIAGNOSIS — Z170 Estrogen receptor positive status [ER+]: Secondary | ICD-10-CM

## 2017-10-24 DIAGNOSIS — Z510 Encounter for antineoplastic radiation therapy: Secondary | ICD-10-CM | POA: Diagnosis not present

## 2017-10-24 LAB — CBC WITH DIFFERENTIAL/PLATELET
Abs Immature Granulocytes: 0.32 10*3/uL — ABNORMAL HIGH (ref 0.00–0.07)
Basophils Absolute: 0 10*3/uL (ref 0.0–0.1)
Basophils Relative: 1 %
Eosinophils Absolute: 0 10*3/uL (ref 0.0–0.5)
Eosinophils Relative: 0 %
HEMATOCRIT: 32.9 % — AB (ref 36.0–46.0)
Hemoglobin: 10.3 g/dL — ABNORMAL LOW (ref 12.0–15.0)
Immature Granulocytes: 8 %
Lymphocytes Relative: 7 %
Lymphs Abs: 0.3 10*3/uL — ABNORMAL LOW (ref 0.7–4.0)
MCH: 31 pg (ref 26.0–34.0)
MCHC: 31.3 g/dL (ref 30.0–36.0)
MCV: 99.1 fL (ref 80.0–100.0)
MONO ABS: 0.4 10*3/uL (ref 0.1–1.0)
Monocytes Relative: 11 %
NEUTROS ABS: 3 10*3/uL (ref 1.7–7.7)
NEUTROS PCT: 73 %
Platelets: 205 10*3/uL (ref 150–400)
RBC: 3.32 MIL/uL — ABNORMAL LOW (ref 3.87–5.11)
RDW: 17.3 % — ABNORMAL HIGH (ref 11.5–15.5)
WBC: 4 10*3/uL (ref 4.0–10.5)
nRBC: 0.5 % — ABNORMAL HIGH (ref 0.0–0.2)

## 2017-10-24 LAB — COMPREHENSIVE METABOLIC PANEL
ALT: 69 U/L — ABNORMAL HIGH (ref 0–44)
ANION GAP: 10 (ref 5–15)
AST: 74 U/L — ABNORMAL HIGH (ref 15–41)
Albumin: 3.5 g/dL (ref 3.5–5.0)
Alkaline Phosphatase: 84 U/L (ref 38–126)
BILIRUBIN TOTAL: 0.5 mg/dL (ref 0.3–1.2)
BUN: 30 mg/dL — AB (ref 8–23)
CO2: 26 mmol/L (ref 22–32)
Calcium: 9.6 mg/dL (ref 8.9–10.3)
Chloride: 102 mmol/L (ref 98–111)
Creatinine, Ser: 1.17 mg/dL — ABNORMAL HIGH (ref 0.44–1.00)
GFR, EST AFRICAN AMERICAN: 55 mL/min — AB (ref >60)
GFR, EST NON AFRICAN AMERICAN: 47 mL/min — AB (ref >60)
Glucose, Bld: 145 mg/dL — ABNORMAL HIGH (ref 70–99)
POTASSIUM: 5.1 mmol/L (ref 3.5–5.1)
Sodium: 138 mmol/L (ref 135–145)
TOTAL PROTEIN: 6.3 g/dL — AB (ref 6.5–8.1)

## 2017-10-24 LAB — SAMPLE TO BLOOD BANK

## 2017-10-24 MED ORDER — HEPARIN SOD (PORK) LOCK FLUSH 100 UNIT/ML IV SOLN
500.0000 [IU] | Freq: Once | INTRAVENOUS | Status: AC
  Filled 2017-10-24: qty 5

## 2017-10-24 MED ORDER — SODIUM CHLORIDE 0.9% FLUSH
10.0000 mL | INTRAVENOUS | Status: DC | PRN
  Administered 2017-10-24: 10 mL via INTRAVENOUS
  Filled 2017-10-24: qty 10

## 2017-10-24 MED ORDER — INFLUENZA VAC SPLIT HIGH-DOSE 0.5 ML IM SUSY
0.5000 mL | PREFILLED_SYRINGE | Freq: Once | INTRAMUSCULAR | Status: AC
  Administered 2017-10-24: 0.5 mL via INTRAMUSCULAR
  Filled 2017-10-24: qty 0.5

## 2017-10-24 MED ORDER — ZOLEDRONIC ACID 4 MG/100ML IV SOLN
4.0000 mg | Freq: Once | INTRAVENOUS | Status: AC
  Administered 2017-10-24: 4 mg via INTRAVENOUS
  Filled 2017-10-24: qty 100

## 2017-10-24 MED ORDER — DEXAMETHASONE 4 MG PO TABS: 4.0000 mg | ORAL_TABLET | ORAL | 3 refills | Status: DC

## 2017-10-24 MED ORDER — SODIUM CHLORIDE 0.9 % IV SOLN
INTRAVENOUS | Status: DC
  Administered 2017-10-24: 12:00:00 via INTRAVENOUS
  Filled 2017-10-24: qty 250

## 2017-10-24 MED ORDER — HEPARIN SOD (PORK) LOCK FLUSH 100 UNIT/ML IV SOLN
500.0000 [IU] | Freq: Once | INTRAVENOUS | Status: AC
  Administered 2017-10-24: 500 [IU] via INTRAVENOUS

## 2017-10-24 NOTE — Progress Notes (Signed)
Pt in for follow up, reports "steroid making me eat all the time".  Denies any difficulties or concerns today.

## 2017-10-25 ENCOUNTER — Ambulatory Visit
Admission: RE | Admit: 2017-10-25 | Discharge: 2017-10-25 | Disposition: A | Discharge status: Home / Self Care | Payer: Medicare Other | Source: Ambulatory Visit | Attending: Radiation Oncology | Admitting: Radiation Oncology

## 2017-10-25 DIAGNOSIS — Z510 Encounter for antineoplastic radiation therapy: Secondary | ICD-10-CM | POA: Diagnosis not present

## 2017-10-25 DIAGNOSIS — C7951 Secondary malignant neoplasm of bone: Secondary | ICD-10-CM | POA: Diagnosis not present

## 2017-10-26 ENCOUNTER — Ambulatory Visit
Admission: RE | Admit: 2017-10-26 | Discharge: 2017-10-26 | Disposition: A | Discharge status: Home / Self Care | Payer: Medicare Other | Source: Ambulatory Visit | Attending: Radiation Oncology | Admitting: Radiation Oncology

## 2017-10-26 DIAGNOSIS — Z510 Encounter for antineoplastic radiation therapy: Secondary | ICD-10-CM | POA: Diagnosis not present

## 2017-10-26 DIAGNOSIS — C7951 Secondary malignant neoplasm of bone: Secondary | ICD-10-CM | POA: Diagnosis not present

## 2017-10-26 MED FILL — IBRANCE 75 MG CAPSULE: 75 | 28 days supply | Qty: 14 | Fill #3

## 2017-10-29 ENCOUNTER — Ambulatory Visit
Admission: RE | Admit: 2017-10-29 | Discharge: 2017-10-29 | Disposition: A | Discharge status: Home / Self Care | Payer: Medicare Other | Source: Ambulatory Visit | Attending: Radiation Oncology | Admitting: Radiation Oncology

## 2017-10-29 DIAGNOSIS — C7951 Secondary malignant neoplasm of bone: Secondary | ICD-10-CM | POA: Diagnosis not present

## 2017-10-29 DIAGNOSIS — Z510 Encounter for antineoplastic radiation therapy: Secondary | ICD-10-CM | POA: Diagnosis not present

## 2017-10-30 ENCOUNTER — Ambulatory Visit
Admission: RE | Admit: 2017-10-30 | Discharge: 2017-10-30 | Disposition: A | Discharge status: Home / Self Care | Payer: Medicare Other | Source: Ambulatory Visit | Attending: Radiation Oncology | Admitting: Radiation Oncology

## 2017-10-30 DIAGNOSIS — Z170 Estrogen receptor positive status [ER+]: Secondary | ICD-10-CM | POA: Diagnosis not present

## 2017-10-30 DIAGNOSIS — C7951 Secondary malignant neoplasm of bone: Secondary | ICD-10-CM | POA: Diagnosis not present

## 2017-10-30 DIAGNOSIS — C50912 Malignant neoplasm of unspecified site of left female breast: Secondary | ICD-10-CM | POA: Diagnosis not present

## 2017-10-30 DIAGNOSIS — Z510 Encounter for antineoplastic radiation therapy: Secondary | ICD-10-CM | POA: Diagnosis not present

## 2017-11-03 ENCOUNTER — Emergency Department: Payer: Medicare Other

## 2017-11-03 ENCOUNTER — Other Ambulatory Visit: Payer: Self-pay

## 2017-11-03 ENCOUNTER — Encounter: Payer: Self-pay | Admitting: Emergency Medicine

## 2017-11-03 ENCOUNTER — Inpatient Hospital Stay
Admission: EM | Admit: 2017-11-03 | Discharge: 2017-11-06 | DRG: 194 | Disposition: A | Discharge status: Home Health | Payer: Medicare Other | Attending: Internal Medicine | Admitting: Internal Medicine

## 2017-11-03 DIAGNOSIS — R0602 Shortness of breath: Secondary | ICD-10-CM | POA: Diagnosis not present

## 2017-11-03 DIAGNOSIS — G629 Polyneuropathy, unspecified: Secondary | ICD-10-CM | POA: Diagnosis present

## 2017-11-03 DIAGNOSIS — J811 Chronic pulmonary edema: Secondary | ICD-10-CM | POA: Diagnosis present

## 2017-11-03 DIAGNOSIS — Z9012 Acquired absence of left breast and nipple: Secondary | ICD-10-CM

## 2017-11-03 DIAGNOSIS — E119 Type 2 diabetes mellitus without complications: Secondary | ICD-10-CM | POA: Diagnosis present

## 2017-11-03 DIAGNOSIS — Z79899 Other long term (current) drug therapy: Secondary | ICD-10-CM | POA: Diagnosis not present

## 2017-11-03 DIAGNOSIS — Z91048 Other nonmedicinal substance allergy status: Secondary | ICD-10-CM

## 2017-11-03 DIAGNOSIS — Z96611 Presence of right artificial shoulder joint: Secondary | ICD-10-CM | POA: Diagnosis present

## 2017-11-03 DIAGNOSIS — Z96652 Presence of left artificial knee joint: Secondary | ICD-10-CM | POA: Diagnosis present

## 2017-11-03 DIAGNOSIS — C50919 Malignant neoplasm of unspecified site of unspecified female breast: Secondary | ICD-10-CM | POA: Diagnosis present

## 2017-11-03 DIAGNOSIS — Z923 Personal history of irradiation: Secondary | ICD-10-CM | POA: Diagnosis not present

## 2017-11-03 DIAGNOSIS — I10 Essential (primary) hypertension: Secondary | ICD-10-CM | POA: Diagnosis present

## 2017-11-03 DIAGNOSIS — E785 Hyperlipidemia, unspecified: Secondary | ICD-10-CM | POA: Diagnosis present

## 2017-11-03 DIAGNOSIS — C7951 Secondary malignant neoplasm of bone: Secondary | ICD-10-CM | POA: Diagnosis present

## 2017-11-03 DIAGNOSIS — K219 Gastro-esophageal reflux disease without esophagitis: Secondary | ICD-10-CM | POA: Diagnosis present

## 2017-11-03 DIAGNOSIS — Z803 Family history of malignant neoplasm of breast: Secondary | ICD-10-CM | POA: Diagnosis not present

## 2017-11-03 DIAGNOSIS — Z9221 Personal history of antineoplastic chemotherapy: Secondary | ICD-10-CM | POA: Diagnosis not present

## 2017-11-03 DIAGNOSIS — R0902 Hypoxemia: Secondary | ICD-10-CM | POA: Diagnosis present

## 2017-11-03 DIAGNOSIS — F329 Major depressive disorder, single episode, unspecified: Secondary | ICD-10-CM | POA: Diagnosis present

## 2017-11-03 DIAGNOSIS — J189 Pneumonia, unspecified organism: Secondary | ICD-10-CM | POA: Diagnosis not present

## 2017-11-03 DIAGNOSIS — G4700 Insomnia, unspecified: Secondary | ICD-10-CM | POA: Diagnosis present

## 2017-11-03 DIAGNOSIS — Z91040 Latex allergy status: Secondary | ICD-10-CM | POA: Diagnosis not present

## 2017-11-03 DIAGNOSIS — J302 Other seasonal allergic rhinitis: Secondary | ICD-10-CM | POA: Diagnosis present

## 2017-11-03 DIAGNOSIS — Z7984 Long term (current) use of oral hypoglycemic drugs: Secondary | ICD-10-CM

## 2017-11-03 DIAGNOSIS — G43909 Migraine, unspecified, not intractable, without status migrainosus: Secondary | ICD-10-CM | POA: Diagnosis present

## 2017-11-03 DIAGNOSIS — Z888 Allergy status to other drugs, medicaments and biological substances status: Secondary | ICD-10-CM | POA: Diagnosis not present

## 2017-11-03 DIAGNOSIS — Z885 Allergy status to narcotic agent status: Secondary | ICD-10-CM

## 2017-11-03 DIAGNOSIS — Z880 Allergy status to penicillin: Secondary | ICD-10-CM

## 2017-11-03 LAB — BASIC METABOLIC PANEL
ANION GAP: 14 (ref 5–15)
BUN: 29 mg/dL — ABNORMAL HIGH (ref 8–23)
CO2: 17 mmol/L — ABNORMAL LOW (ref 22–32)
Calcium: 9.4 mg/dL (ref 8.9–10.3)
Chloride: 102 mmol/L (ref 98–111)
Creatinine, Ser: 1.44 mg/dL — ABNORMAL HIGH (ref 0.44–1.00)
GFR, EST AFRICAN AMERICAN: 43 mL/min — AB (ref >60)
GFR, EST NON AFRICAN AMERICAN: 37 mL/min — AB (ref >60)
Glucose, Bld: 215 mg/dL — ABNORMAL HIGH (ref 70–99)
POTASSIUM: 4.1 mmol/L (ref 3.5–5.1)
SODIUM: 133 mmol/L — AB (ref 135–145)

## 2017-11-03 LAB — CBC WITH DIFFERENTIAL/PLATELET
BASOS PCT: 0 %
Basophils Absolute: 0 10*3/uL (ref 0.0–0.1)
EOS ABS: 0 10*3/uL (ref 0.0–0.5)
EOS PCT: 0 %
HEMATOCRIT: 32.6 % — AB (ref 36.0–46.0)
HEMOGLOBIN: 10.2 g/dL — AB (ref 12.0–15.0)
LYMPHS ABS: 0.1 10*3/uL — AB (ref 0.7–4.0)
Lymphocytes Relative: 3 %
MCH: 31.1 pg (ref 26.0–34.0)
MCHC: 31.3 g/dL (ref 30.0–36.0)
MCV: 99.4 fL (ref 80.0–100.0)
MONOS PCT: 4 %
Monocytes Absolute: 0.1 10*3/uL (ref 0.1–1.0)
NEUTROS PCT: 93 %
Neutro Abs: 3.3 10*3/uL (ref 1.7–7.7)
Platelets: 223 10*3/uL (ref 150–400)
RBC: 3.28 MIL/uL — ABNORMAL LOW (ref 3.87–5.11)
RDW: 16.5 % — ABNORMAL HIGH (ref 11.5–15.5)
WBC: 3.6 10*3/uL — ABNORMAL LOW (ref 4.0–10.5)
nRBC: 0 % (ref 0.0–0.2)
nRBC: 1 /100 WBC — ABNORMAL HIGH

## 2017-11-03 LAB — TROPONIN I: TROPONIN I: 0.03 ng/mL — AB (ref <0.03)

## 2017-11-03 LAB — BRAIN NATRIURETIC PEPTIDE: B NATRIURETIC PEPTIDE 5: 56 pg/mL (ref 0.0–100.0)

## 2017-11-03 MED ORDER — SODIUM CHLORIDE 0.9% FLUSH
3.0000 mL | Freq: Two times a day (BID) | INTRAVENOUS | Status: DC
  Administered 2017-11-03 – 2017-11-06 (×5): 3 mL via INTRAVENOUS

## 2017-11-03 MED ORDER — OXYCODONE HCL 5 MG PO TABS: 5.0000 mg | ORAL_TABLET | ORAL | Status: DC | PRN

## 2017-11-03 MED ORDER — GABAPENTIN 300 MG PO CAPS
600.0000 mg | ORAL_CAPSULE | Freq: Two times a day (BID) | ORAL | Status: DC
  Administered 2017-11-04 – 2017-11-06 (×6): 600 mg via ORAL
  Filled 2017-11-03 (×6): qty 2

## 2017-11-03 MED ORDER — IOPAMIDOL (ISOVUE-370) INJECTION 76%: 75.0000 mL | Freq: Once | INTRAVENOUS | Status: DC | PRN

## 2017-11-03 MED ORDER — IOPAMIDOL (ISOVUE-370) INJECTION 76%
60.0000 mL | Freq: Once | INTRAVENOUS | Status: AC | PRN
  Administered 2017-11-03: 60 mL via INTRAVENOUS

## 2017-11-03 MED ORDER — GABAPENTIN 300 MG PO CAPS: 600.0000 mg | ORAL_CAPSULE | ORAL | Status: DC

## 2017-11-03 MED ORDER — SODIUM CHLORIDE 0.9% FLUSH: 3.0000 mL | INTRAVENOUS | Status: DC | PRN

## 2017-11-03 MED ORDER — PANTOPRAZOLE SODIUM 40 MG PO TBEC
40.0000 mg | DELAYED_RELEASE_TABLET | Freq: Every evening | ORAL | Status: DC
  Administered 2017-11-03 – 2017-11-05 (×3): 40 mg via ORAL
  Filled 2017-11-03 (×4): qty 1

## 2017-11-03 MED ORDER — SODIUM CHLORIDE 0.9 % IV SOLN
1.0000 g | INTRAVENOUS | Status: DC
  Administered 2017-11-03 – 2017-11-05 (×3): 1 g via INTRAVENOUS
  Filled 2017-11-03: qty 10
  Filled 2017-11-03 (×2): qty 1
  Filled 2017-11-03: qty 10

## 2017-11-03 MED ORDER — LORATADINE 10 MG PO TABS
10.0000 mg | ORAL_TABLET | Freq: Every day | ORAL | Status: DC
  Administered 2017-11-03 – 2017-11-05 (×3): 10 mg via ORAL
  Filled 2017-11-03 (×3): qty 1

## 2017-11-03 MED ORDER — ZOLPIDEM TARTRATE 5 MG PO TABS: 5.0000 mg | ORAL_TABLET | Freq: Every evening | ORAL | Status: DC | PRN

## 2017-11-03 MED ORDER — ZOLPIDEM TARTRATE 5 MG PO TABS: 10.0000 mg | ORAL_TABLET | Freq: Every evening | ORAL | Status: DC | PRN

## 2017-11-03 MED ORDER — DEXAMETHASONE 4 MG PO TABS
4.0000 mg | ORAL_TABLET | ORAL | Status: DC
  Filled 2017-11-03: qty 1

## 2017-11-03 MED ORDER — ONDANSETRON HCL 4 MG PO TABS: 4.0000 mg | ORAL_TABLET | Freq: Four times a day (QID) | ORAL | Status: DC | PRN

## 2017-11-03 MED ORDER — PAROXETINE HCL 20 MG PO TABS
40.0000 mg | ORAL_TABLET | Freq: Every evening | ORAL | Status: DC
  Administered 2017-11-03 – 2017-11-05 (×3): 40 mg via ORAL
  Filled 2017-11-03 (×3): qty 2

## 2017-11-03 MED ORDER — VITAMIN B-12 1000 MCG PO TABS
5000.0000 ug | ORAL_TABLET | Freq: Every day | ORAL | Status: DC
  Administered 2017-11-04 – 2017-11-06 (×3): 5000 ug via ORAL
  Filled 2017-11-03 (×3): qty 5

## 2017-11-03 MED ORDER — FERROUS SULFATE 325 (65 FE) MG PO TABS
325.0000 mg | ORAL_TABLET | Freq: Every day | ORAL | Status: DC
  Administered 2017-11-04 – 2017-11-06 (×3): 325 mg via ORAL
  Filled 2017-11-03 (×3): qty 1

## 2017-11-03 MED ORDER — SENNOSIDES-DOCUSATE SODIUM 8.6-50 MG PO TABS: 1.0000 | ORAL_TABLET | Freq: Every evening | ORAL | Status: DC | PRN

## 2017-11-03 MED ORDER — ACETAMINOPHEN 650 MG RE SUPP: 650.0000 mg | Freq: Four times a day (QID) | RECTAL | Status: DC | PRN

## 2017-11-03 MED ORDER — LETROZOLE 2.5 MG PO TABS
2.5000 mg | ORAL_TABLET | Freq: Every day | ORAL | Status: DC
  Administered 2017-11-04 – 2017-11-06 (×3): 2.5 mg via ORAL
  Filled 2017-11-03 (×3): qty 1

## 2017-11-03 MED ORDER — ACETAMINOPHEN 325 MG PO TABS
650.0000 mg | ORAL_TABLET | Freq: Four times a day (QID) | ORAL | Status: DC | PRN
  Administered 2017-11-05: 650 mg via ORAL
  Filled 2017-11-03: qty 2

## 2017-11-03 MED ORDER — HEPARIN SODIUM (PORCINE) 5000 UNIT/ML IJ SOLN
5000.0000 [IU] | Freq: Three times a day (TID) | INTRAMUSCULAR | Status: DC
  Administered 2017-11-03 – 2017-11-06 (×8): 5000 [IU] via SUBCUTANEOUS
  Filled 2017-11-03 (×8): qty 1

## 2017-11-03 MED ORDER — ATORVASTATIN CALCIUM 10 MG PO TABS
10.0000 mg | ORAL_TABLET | Freq: Every evening | ORAL | Status: DC
  Administered 2017-11-03 – 2017-11-05 (×3): 10 mg via ORAL
  Filled 2017-11-03 (×4): qty 1

## 2017-11-03 MED ORDER — METFORMIN HCL 850 MG PO TABS
850.0000 mg | ORAL_TABLET | Freq: Two times a day (BID) | ORAL | Status: DC
  Administered 2017-11-05 – 2017-11-06 (×3): 850 mg via ORAL
  Filled 2017-11-03 (×7): qty 1

## 2017-11-03 MED ORDER — ONDANSETRON HCL 4 MG/2ML IJ SOLN: 4.0000 mg | Freq: Four times a day (QID) | INTRAMUSCULAR | Status: DC | PRN

## 2017-11-03 MED ORDER — SODIUM CHLORIDE 0.9 % IV SOLN
250.0000 mL | INTRAVENOUS | Status: DC | PRN
  Administered 2017-11-05: 250 mL via INTRAVENOUS

## 2017-11-03 MED ORDER — PALBOCICLIB 75 MG PO CAPS
75.0000 mg | ORAL_CAPSULE | Freq: Every day | ORAL | Status: DC
  Administered 2017-11-05 – 2017-11-06 (×2): 75 mg via ORAL
  Filled 2017-11-03 (×2): qty 1

## 2017-11-03 MED ORDER — GABAPENTIN 300 MG PO CAPS
900.0000 mg | ORAL_CAPSULE | Freq: Every day | ORAL | Status: DC
  Administered 2017-11-03 – 2017-11-05 (×3): 900 mg via ORAL
  Filled 2017-11-03 (×3): qty 3

## 2017-11-03 MED ORDER — BUPROPION HCL ER (XL) 150 MG PO TB24
150.0000 mg | ORAL_TABLET | Freq: Every evening | ORAL | Status: DC
  Administered 2017-11-03 – 2017-11-05 (×3): 150 mg via ORAL
  Filled 2017-11-03 (×4): qty 1

## 2017-11-03 MED ORDER — SODIUM CHLORIDE 0.9 % IV SOLN
500.0000 mg | INTRAVENOUS | Status: DC
  Administered 2017-11-03 – 2017-11-04 (×2): 500 mg via INTRAVENOUS
  Filled 2017-11-03 (×3): qty 500

## 2017-11-03 NOTE — ED Notes (Signed)
Pt resting in bed, finishing meal, O2 sats 89% placed on 2L nasal cannula.

## 2017-11-03 NOTE — H&P (Signed)
[facility] [place] NAME: Amanda [name]    MR#:  [id number]  DATE OF BIRTH:  01-10-1951  DATE OF ADMISSION:  11/03/2017  PRIMARY CARE PHYSICIAN: Sofie Hartigan, MD   REQUESTING/REFERRING PHYSICIAN:   CHIEF COMPLAINT:   Chief Complaint  Patient presents with  . Shortness of Breath    HISTORY OF PRESENT ILLNESS: Amanda [name]  is a 67 y.o. female with a known history of metastatic breast cancer, migraine, osteoarthritis, GERD, diabetes mellitus type presented to the emergency room for shortness of breath for couple of days.  Has occasional cough and also has generalized weakness.  Patient was worked up with CTA chest which showed no pulmonary embolism but pneumonia.  She was started on IV antibiotics.  Patient also had low oxygen saturation on presentation in the emergency room and had hypoxia.  She was put on oxygen via nasal cannula.  Patient currently on chemotherapy.  Hospitalist service was consulted.  PAST MEDICAL HISTORY:   Past Medical History:  Diagnosis Date  . Back pain    occasionally  . Breast cancer ([place]) 2009   left  . Depression    takes Paxil and Wellbutrin daily  . Diabetes ([place])    takes Metformin daily  . Genetic testing 02/03/2017   Multi-Cancer panel (83 genes) @ [facility] - No pathogenic mutations detected  . GERD (gastroesophageal reflux disease)   . History of bronchitis 2 yrs ago  . Hyperlipidemia    takes Atorvastatin daily  . Hypertension    takes Losartan-HCTZ daily  . Insomnia    takes Ambien nightly  . Insomnia    takes gabapentin nightly  . Joint pain   . Migraine   . Mood swings   . Osteoarthritis of knee   . Personal history of chemotherapy 12/05/2016   Mets from Breast Cancer  . Personal history of radiation therapy 11/2016  . PONV (postoperative nausea and vomiting)   . Seasonal allergies    takes Allegra daily    PAST SURGICAL HISTORY:  Past Surgical History:  Procedure  Laterality Date  . BREAST BIOPSY  2009  . cataract surgery Bilateral   . COLONOSCOPY    . JOINT REPLACEMENT Left 2014   knee  . KNEE ARTHROSCOPY Left    x 5  . MASTECTOMY Left   . port a cath placed    . PORTACATH PLACEMENT N/A 09/17/2015   Procedure: INSERTION PORT-A-CATH;  Surgeon: Jules Husbands, MD;  Location: [facility];  Service: General;  Laterality: N/A;  . TOTAL SHOULDER ARTHROPLASTY Right 06/03/2015   Procedure: TOTAL SHOULDER ARTHROPLASTY;  Surgeon: Tania Ade, MD;  Location: [facility];  Service: Orthopedics;  Laterality: Right;  Right total shoulder arthroplasty  . TOTAL SHOULDER REPLACEMENT Right 06/03/2015  . TUBAL LIGATION      SOCIAL HISTORY:  Social History   Tobacco Use  . Smoking status: Never Smoker  . Smokeless tobacco: Never Used  Substance Use Topics  . Alcohol use: Yes    Alcohol/week: 0.0 standard drinks    Comment: occasionally wine    FAMILY HISTORY:  Family History  Problem Relation Age of Onset  . Atrial fibrillation Mother        deceased 73; TAH/BSO  . Non-Hodgkin's lymphoma Father        deceased 48  . Heart attack Maternal Uncle   . Melanoma Maternal Uncle   . Breast cancer Paternal Aunt  dx in 55s  . Breast cancer Paternal Aunt        dx in 57s  . Cancer Brother 78       unk. type  . Breast cancer Other        Mat grandmother's 2 sisters; dx at 37 and 29  . Stomach cancer Paternal Grandfather 39       deceased 72s  . Breast cancer Cousin        daughter of pat aunt with breast ca    DRUG ALLERGIES:  Allergies  Allergen Reactions  . Ace Inhibitors Cough  . Latex Itching  . Morphine And Related Itching    Caused her to itch terribly. Would prefer if given to take with a benadryl  . Other Itching    Freeze spray. Patient stated that she may be able to use it now because she doesn't use it as often.  Marland Kitchen Penicillins Rash    Has patient had a PCN reaction causing immediate rash, facial/tongue/throat swelling, SOB or  lightheadedness with hypotension: No Has patient had a PCN reaction causing severe rash involving mucus membranes or skin necrosis: No Has patient had a PCN reaction that required hospitalization No Has patient had a PCN reaction occurring within the last 10 years: No If all of the above answers are "NO", then may proceed with Cephalosporin use.    REVIEW OF SYSTEMS:   CONSTITUTIONAL: No fever, has fatigue and weakness.  EYES: No blurred or double vision.  EARS, NOSE, AND THROAT: No tinnitus or ear pain.  RESPIRATORY: Has cough, shortness of breath,  No wheezing or hemoptysis.  CARDIOVASCULAR: No chest pain, orthopnea, edema.  GASTROINTESTINAL: No nausea, vomiting, diarrhea or abdominal pain.  GENITOURINARY: No dysuria, hematuria.  ENDOCRINE: No polyuria, nocturia,  HEMATOLOGY: No anemia, easy bruising or bleeding SKIN: No rash or lesion. MUSCULOSKELETAL: No joint pain or arthritis.   NEUROLOGIC: No tingling, numbness, weakness.  PSYCHIATRY: No anxiety or depression.   MEDICATIONS AT HOME:  Prior to Admission medications   Medication Sig Start Date End Date Taking? Authorizing Provider  acetaminophen (TYLENOL) 500 MG tablet Take 500 mg by mouth every 6 (six) hours as needed for mild pain or moderate pain.    Yes [provider]  atorvastatin (LIPITOR) 10 MG tablet Take 10 mg by mouth every evening.    Yes [provider]  buPROPion (WELLBUTRIN XL) 150 MG 24 hr tablet Take 150 mg by mouth every evening.  08/08/13  Yes [provider]  Calcium-Magnesium-Vitamin D (CALCIUM 1200+D3 PO) Take 1 tablet by mouth daily.    Yes [provider]  Cyanocobalamin 5000 MCG CAPS Take 5,000 mcg by mouth daily.    Yes [provider]  dexamethasone (DECADRON) 4 MG tablet Take 1 tablet (4 mg total) by mouth every other day. 10/24/17  Yes Lloyd Huger, MD  ferrous sulfate 325 (65 FE) MG EC tablet Take 325 mg by mouth daily.    Yes [provider]  fexofenadine (ALLEGRA) 180 MG tablet Take 180 mg by mouth at bedtime.    Yes [provider]  gabapentin (NEURONTIN) 300 MG capsule Take 600-900 mg by mouth See admin instructions. Take 2 capsules (600MG ) by mouth every morning, 2 capsules (600MG )by mouth  at noon and 3 capsules (900MG ) by mouth every night   Yes [provider]  letrozole (FEMARA) 2.5 MG tablet Take 1 tablet (2.5 mg total) by mouth daily. 07/04/17  Yes Lloyd Huger, MD  lidocaine-prilocaine (EMLA) cream Apply 1 application topically as needed. Apply to port 1-2 hours prior to chemotherapy appointment. Cover with plastic wrap. 09/27/15  Yes Lloyd Huger, MD  metFORMIN (GLUCOPHAGE) 850 MG tablet Take 850 mg by mouth 2 (two) times daily with a meal.    Yes [provider]  palbociclib (IBRANCE) 75 MG capsule Take 1 capsule (75 mg total) by mouth daily with breakfast. Take for 14 days, then 14 days off. 07/04/17  Yes Lloyd Huger, MD  pantoprazole (PROTONIX) 40 MG tablet Take 40 mg by mouth every evening.    Yes [provider]  PARoxetine (PAXIL) 40 MG tablet Take 40 mg by mouth every evening.  08/08/13  Yes [provider]  prochlorperazine (COMPAZINE) 10 MG tablet Take 1 tablet (10 mg total) by mouth every 6 (six) hours as needed for nausea or vomiting. 09/03/17  Yes Lloyd Huger, MD  Zolpidem Tartrate (AMBIEN PO) Take 10 mg by mouth at bedtime as needed.    Yes [provider]  ibuprofen (ADVIL,MOTRIN) 800 MG tablet Take 1 tablet (800 mg total) every 8 (eight) hours as needed by mouth for mild pain. Patient not taking: Reported on 11/03/2017 11/27/16   Jacquelin Hawking, NP      PHYSICAL EXAMINATION:   VITAL SIGNS: Blood pressure 126/73, pulse 88, temperature 99.6 F (37.6 C), temperature source Oral, resp. rate (!) 22, weight 88.6 kg, SpO2 91 %.  GENERAL:  67 y.o.-year-old patient lying in the bed with no acute distress.  EYES: Pupils equal,  round, reactive to light and accommodation. No scleral icterus. Extraocular muscles intact.  HEENT: Head atraumatic, normocephalic. Oropharynx and nasopharynx clear.  NECK:  Supple, no jugular venous distention. No thyroid enlargement, no tenderness.  LUNGS: Decreased breath sounds bilaterally, scattered rales in both lungs. No use of accessory muscles of respiration.  CARDIOVASCULAR: S1, S2 normal. No murmurs, rubs, or gallops.  ABDOMEN: Soft, nontender, nondistended. Bowel sounds present. No organomegaly or mass.  EXTREMITIES: No pedal edema, cyanosis, or clubbing.  NEUROLOGIC: Cranial nerves II through XII are intact. Muscle strength 5/5 in all extremities. Sensation intact. Gait not checked.  PSYCHIATRIC: The patient is alert and oriented x 3.  SKIN: No obvious rash, lesion, or ulcer.   LABORATORY PANEL:   CBC Recent Labs  Lab 11/03/17 1430  WBC 3.6*  HGB 10.2*  HCT 32.6*  PLT 223  MCV 99.4  MCH 31.1  MCHC 31.3  RDW 16.5*  LYMPHSABS 0.1*  MONOABS 0.1  EOSABS 0.0  BASOSABS 0.0   ------------------------------------------------------------------------------------------------------------------  Chemistries  Recent Labs  Lab 11/03/17 1430  NA 133*  K 4.1  CL 102  CO2 17*  GLUCOSE 215*  BUN 29*  CREATININE 1.44*  CALCIUM 9.4   ------------------------------------------------------------------------------------------------------------------ estimated creatinine clearance is 41.4 mL/min (A) (by C-G formula based on SCr of 1.44 mg/dL (H)). ------------------------------------------------------------------------------------------------------------------ No results for input(s): TSH, T4TOTAL, T3FREE, THYROIDAB in the last 72 hours.  Invalid input(s): FREET3   Coagulation profile No results for input(s): INR, PROTIME in the last 168 hours. ------------------------------------------------------------------------------------------------------------------- No results for  input(s): DDIMER in the last 72 hours. -------------------------------------------------------------------------------------------------------------------  Cardiac Enzymes Recent Labs  Lab 11/03/17 1430  TROPONINI 0.03*   ------------------------------------------------------------------------------------------------------------------ Invalid input(s): POCBNP  ---------------------------------------------------------------------------------------------------------------  Urinalysis    Component Value Date/Time   COLORURINE YELLOW 02/16/2016 2055   APPEARANCEUR Clear 02/22/2016 1506   LABSPEC 1.020 02/16/2016 2055   PHURINE 5.0 02/16/2016 2055   GLUCOSEU Negative 02/22/2016 1506   HGBUR LARGE (  A) 02/16/2016 2055   BILIRUBINUR Negative 02/22/2016 1506   KETONESUR 5 (A) 02/16/2016 2055   PROTEINUR Trace (A) 02/22/2016 1506   PROTEINUR 30 (A) 02/16/2016 2055   NITRITE Negative 02/22/2016 1506   NITRITE NEGATIVE 02/16/2016 2055   LEUKOCYTESUR Trace (A) 02/22/2016 1506     RADIOLOGY: Dg Chest 2 View  Result Date: 11/03/2017 CLINICAL DATA:  Shortness of breath.  History of breast cancer. EXAM: CHEST - 2 VIEW COMPARISON:  None. FINDINGS: The heart size and mediastinal contours are within normal limits. No pneumothorax or pleural effusion is noted. Right lung is clear. Minimal left perihilar subsegmental atelectasis or inflammation is noted. Right internal jugular Port-A-Cath is in good position. Status post right shoulder arthroplasty. IMPRESSION: Minimal left perihilar subsegmental atelectasis or inflammation. Electronically Signed   By: Marijo Conception, M.D.   On: 11/03/2017 15:00   Ct Angio Chest Pe W And/or Wo Contrast  Result Date: 11/03/2017 CLINICAL DATA:  Increasing shortness of breath. Patient with metastatic breast cancer, being treated with oral chemotherapy and radiation therapy to the spine. EXAM: CT ANGIOGRAPHY CHEST WITH CONTRAST TECHNIQUE: Multidetector CT imaging of  the chest was performed using the standard protocol during bolus administration of intravenous contrast. Multiplanar CT image reconstructions and MIPs were obtained to evaluate the vascular anatomy. CONTRAST:  44mL ISOVUE-370 IOPAMIDOL (ISOVUE-370) INJECTION 76% COMPARISON:  None. FINDINGS: Cardiovascular: Satisfactory opacification of the pulmonary arteries to the segmental level. No evidence of pulmonary embolism. Normal heart size. Minimal pericardial thickening versus pericardial effusion. Mild calcific atherosclerotic disease of the coronary arteries. Right internal jugular approach central venous catheter terminates at the cavoatrial junction. Mediastinum/Nodes: No enlarged mediastinal, hilar, or axillary lymph nodes. Thyroid gland, and trachea demonstrate no significant findings. Mild thickening of the distal esophagus. Small hiatal hernia. Lungs/Pleura: Diffusely scattered bilateral ground-glass opacities in the lung parenchyma with dependent predominance. Possible radiation changes in the anterior left upper lobe and lingula. Upper Abdomen: No acute abnormality. Musculoskeletal: Known widespread skeletal sclerotic metastatic disease, involving the sternum, bilateral ribs, and the spine. Possible healed pathologic fracture in the right lateral rib. Review of the MIP images confirms the above findings. IMPRESSION: No evidence of pulmonary embolus. Diffusely scattered bilateral ground-glass opacities in the lung parenchyma with dependent predominance. This may represent multifocal pneumonia, hypersensitivity pneumonitis or developing interstitial pulmonary edema. Minimal pericardial effusion versus pericardial thickening. Known widespread skeletal metastatic disease. Electronically Signed   By: Fidela Salisbury M.D.   On: 11/03/2017 17:29    EKG: Orders placed or performed in visit on 11/03/17  . EKG 12-Lead    IMPRESSION AND PLAN:  67 year old female patient with history of metastatic breast  cancer, migraine, osteoarthritis, type 2 diabetes mellitus presented to the emergency room for shortness of breath  -Community-acquired pneumonia Start patient on IV Rocephin and Zithromax antibiotic Follow-up cultures  -Hypoxia dyspnea secondary to pneumonia Oxygen via nasal cannula CTA chest showed no pulmonary embolism  -Metastatic breast cancer On chemotherapy Oncology follow-up as outpatient  -DVT prophylaxis with subcu heparin  All the records are reviewed and case discussed with ED provider. Management plans discussed with the patient, family and they are in agreement.  CODE STATUS:Full code    TOTAL TIME TAKING CARE OF THIS PATIENT: 51 minutes.    Saundra Shelling M.D on 11/03/2017 at 6:34 PM  Between 7am to 6pm - Pager - [phone]  After 6pm go to [email or web address] - password [email or web address] [facility]  [facility]  Office  [phone]  CC: Primary care physician;  Sofie Hartigan, MD

## 2017-11-03 NOTE — Progress Notes (Signed)
Advanced care plan.  Purpose of the Encounter: CODE STATUS  Parties in Attendance: Patient and family  Patient's Decision Capacity: Good  Subjective/Patient's story: Presented to emergency room for shortness of breath   Objective/Medical story Patient has pneumonia needs IV antibiotics Patient has metastatic breast cancer   Goals of care determination:  Advance care directives and goals of care discussed Patient wants everything done which includes CPR, intubation ventilator if the need arises   CODE STATUS: Full code   Time spent discussing advanced care planning: 16 minutes

## 2017-11-03 NOTE — ED Notes (Signed)
Patient ambulated to and from commode with help from family. Patient c/o increased shortness of breath with ambulation.

## 2017-11-03 NOTE — ED Notes (Signed)
Dr. Cherylann Banas aware of Troponin of 0.03.

## 2017-11-03 NOTE — ED Notes (Signed)
Patient given ED sandwich tray and ginger ale per Dr. Cherylann Banas.

## 2017-11-03 NOTE — ED Provider Notes (Signed)
[facility] Emergency Department Provider Note ____________________________________________   First MD Initiated Contact with Patient 11/03/17 1402     (approximate)  I have reviewed the triage vital signs and the nursing notes.   HISTORY  Chief Complaint Shortness of Breath    HPI Amanda [name] is a 67 y.o. female with PMH as noted below including breast cancer for which she is actively being treated who presents with shortness of breath over the last week, gradual onset, persistent course, and worse with any exertion.  She denies associated fever, chest pain, or vomiting.  She denies any acute leg swelling or pain.  Past Medical History:  Diagnosis Date  . Back pain    occasionally  . Breast cancer ([place]) 2009   left  . Depression    takes Paxil and Wellbutrin daily  . Diabetes ([place])    takes Metformin daily  . Genetic testing 02/03/2017   Multi-Cancer panel (83 genes) @ [facility] - No pathogenic mutations detected  . GERD (gastroesophageal reflux disease)   . History of bronchitis 2 yrs ago  . Hyperlipidemia    takes Atorvastatin daily  . Hypertension    takes Losartan-HCTZ daily  . Insomnia    takes Ambien nightly  . Insomnia    takes gabapentin nightly  . Joint pain   . Migraine   . Mood swings   . Osteoarthritis of knee   . Personal history of chemotherapy 12/05/2016   Mets from Breast Cancer  . Personal history of radiation therapy 11/2016  . PONV (postoperative nausea and vomiting)   . Seasonal allergies    takes Allegra daily    Patient Active Problem List   Diagnosis Date Noted  . CAP (community acquired pneumonia) 11/03/2017  . Genetic testing 02/03/2017  . Contusion of knee 12/06/2016  . Dislocation of shoulder joint 12/06/2016  . Shoulder joint pain 12/06/2016  . Localized, primary osteoarthritis 12/06/2016  . Osteoarthritis of knee 12/06/2016  . Burning sensation 11/13/2016  . Numbness and tingling 11/13/2016  .  Malignant neoplasm of left breast in female, estrogen receptor positive ([place]) 10/05/2016  . Carcinoma of left breast, stage 4 ([place]) 04/06/2016  . Chemotherapy-induced neuropathy ([place]) 03/07/2016  . Metastatic breast cancer ([place]) 09/24/2015  . 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 Personal history of breast cancer 08/23/2015  . 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 Degenerative arthritis of right shoulder region 01/13/2015  . Cancer of left female breast  ([place]) 07/27/2014  . 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 Rotator cuff tear 06/10/2014  . Allergic rhinitis, seasonal 09/16/2013  . Depression 08/10/2013  . Diabetes mellitus type 2, uncomplicated ([place]) 82/50/5397  . Hyperlipidemia, unspecified 08/10/2013  . BP (high blood pressure) 08/10/2013  . 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 BREAST BIOPSY  2009  . cataract surgery Bilateral   . COLONOSCOPY    . JOINT REPLACEMENT Left 2014   knee  . KNEE ARTHROSCOPY Left    x 5  . MASTECTOMY Left   . port a cath placed    . PORTACATH PLACEMENT N/A 09/17/2015   Procedure: INSERTION PORT-A-CATH;  Surgeon: Jules Husbands, MD;  Location: [facility];  Service: General;  Laterality: N/A;  . TOTAL SHOULDER ARTHROPLASTY Right 06/03/2015   Procedure: TOTAL SHOULDER ARTHROPLASTY;  Surgeon: Tania Ade, MD;  Location: [place];  Service: Orthopedics;  Laterality: Right;  Right  total shoulder arthroplasty  . TOTAL SHOULDER REPLACEMENT Right 06/03/2015  . TUBAL LIGATION      Prior to Admission medications   Medication Sig Start Date End Date Taking? Authorizing Provider  acetaminophen (TYLENOL) 500 MG tablet Take 500 mg by mouth every 6 (six) hours as needed for mild pain or moderate pain.    Yes [provider]  atorvastatin (LIPITOR) 10 MG tablet Take 10 mg by mouth every evening.    Yes [provider]    buPROPion (WELLBUTRIN XL) 150 MG 24 hr tablet Take 150 mg by mouth every evening.  08/08/13  Yes [provider]  Calcium-Magnesium-Vitamin D (CALCIUM 1200+D3 PO) Take 1 tablet by mouth daily.    Yes [provider]  Cyanocobalamin 5000 MCG CAPS Take 5,000 mcg by mouth daily.    Yes [provider]  dexamethasone (DECADRON) 4 MG tablet Take 1 tablet (4 mg total) by mouth every other day. 10/24/17  Yes Lloyd Huger, MD  ferrous sulfate 325 (65 FE) MG EC tablet Take 325 mg by mouth daily.    Yes [provider]  fexofenadine (ALLEGRA) 180 MG tablet Take 180 mg by mouth at bedtime.    Yes [provider]  gabapentin (NEURONTIN) 300 MG capsule Take 600-900 mg by mouth See admin instructions. Take 2 capsules (600MG ) by mouth every morning, 2 capsules (600MG )by mouth  at noon and 3 capsules (900MG ) by mouth every night   Yes [provider]  letrozole (FEMARA) 2.5 MG tablet Take 1 tablet (2.5 mg total) by mouth daily. 07/04/17  Yes Lloyd Huger, MD  lidocaine-prilocaine (EMLA) cream Apply 1 application topically as needed. Apply to port 1-2 hours prior to chemotherapy appointment. Cover with plastic wrap. 09/27/15  Yes Lloyd Huger, MD  metFORMIN (GLUCOPHAGE) 850 MG tablet Take 850 mg by mouth 2 (two) times daily with a meal.    Yes [provider]  palbociclib (IBRANCE) 75 MG capsule Take 1 capsule (75 mg total) by mouth daily with breakfast. Take for 14 days, then 14 days off. 07/04/17  Yes Lloyd Huger, MD  pantoprazole (PROTONIX) 40 MG tablet Take 40 mg by mouth every evening.    Yes [provider]  PARoxetine (PAXIL) 40 MG tablet Take 40 mg by mouth every evening.  08/08/13  Yes [provider]  prochlorperazine (COMPAZINE) 10 MG tablet Take 1 tablet (10 mg total) by mouth every 6 (six) hours as needed for nausea or vomiting. 09/03/17  Yes Lloyd Huger, MD  Zolpidem Tartrate (AMBIEN PO) Take  10 mg by mouth at bedtime as needed.    Yes [provider]  ibuprofen (ADVIL,MOTRIN) 800 MG tablet Take 1 tablet (800 mg total) every 8 (eight) hours as needed by mouth for mild pain. Patient not taking: Reported on 11/03/2017 11/27/16   Jacquelin Hawking, NP    Allergies Ace inhibitors; Latex; Morphine and related; Other; and Penicillins  Family History  Problem Relation Age of Onset  . Atrial fibrillation Mother        deceased 5; TAH/BSO  . Non-Hodgkin's lymphoma Father        deceased 90  . Heart attack Maternal Uncle   . Melanoma Maternal Uncle   . Breast cancer Paternal Aunt        dx in 40s  . Breast cancer Paternal Aunt        dx in 66s  . Cancer Brother 2  unk. type  . Breast cancer Other        Mat grandmother's 2 sisters; dx at 26 and 18  . Stomach cancer Paternal Grandfather 68       deceased 22s  . Breast cancer Cousin        daughter of pat aunt with breast ca    Social History Social History   Tobacco Use  . Smoking status: Never Smoker  . Smokeless tobacco: Never Used  Substance Use Topics  . Alcohol use: Yes    Alcohol/week: 0.0 standard drinks    Comment: occasionally wine  . Drug use: Yes    Types: Marijuana    Comment: cannabis with no extra    Review of Systems  Constitutional: No fever. Eyes: No redness. ENT: No sore throat. Cardiovascular: Denies chest pain. Respiratory: Positive for shortness of breath. Gastrointestinal: No vomiting.  Genitourinary: Negative for dysuria.  Musculoskeletal: Negative for back pain. Skin: Negative for rash. Neurological: Negative for headache.   ____________________________________________   PHYSICAL EXAM:  VITAL SIGNS: ED Triage Vitals  Enc Vitals Group     BP --      Pulse Rate 11/03/17 1402 97     Resp 11/03/17 1402 (!) 26     Temp 11/03/17 1402 99.6 F (37.6 C)     Temp Source 11/03/17 1402 Oral     SpO2 11/03/17 1402 95 %     Weight 11/03/17 1357 195 lb 7 oz (88.6 kg)      Height --      Head Circumference --      Peak Flow --      Pain Score 11/03/17 1357 0     Pain Loc --      Pain Edu? --      Excl. in [place]? --     Constitutional: Alert and oriented.  Weak appearing but in no acute distress. Eyes: Conjunctivae are normal.  Head: Atraumatic. Nose: No congestion/rhinnorhea. Mouth/Throat: Mucous membranes are slightly dry.   Neck: Normal range of motion.  Cardiovascular: Tachycardic, regular rhythm. Grossly normal heart sounds.  Good peripheral circulation. Respiratory: Normal respiratory effort.  No retractions. Lungs CTAB. Gastrointestinal:  No distention.  Musculoskeletal: No lower extremity edema.  Extremities warm and well perfused.  No calf or popliteal swelling or tenderness. Neurologic:  Normal speech and language. No gross focal neurologic deficits are appreciated.  Skin:  Skin is warm and dry. No rash noted. Psychiatric: Mood and affect are normal. Speech and behavior are normal.  ____________________________________________   LABS (all labs ordered are listed, but only abnormal results are displayed)  Labs Reviewed  BASIC METABOLIC PANEL - Abnormal; Notable for the following components:      Result Value   Sodium 133 (*)    CO2 17 (*)    Glucose, Bld 215 (*)    BUN 29 (*)    Creatinine, Ser 1.44 (*)    GFR calc non Af Amer 37 (*)    GFR calc Af Amer 43 (*)    All other components within normal limits  TROPONIN I - Abnormal; Notable for the following components:   Troponin I 0.03 (*)    All other components within normal limits  CBC WITH DIFFERENTIAL/PLATELET - Abnormal; Notable for the following components:   WBC 3.6 (*)    RBC 3.28 (*)    Hemoglobin 10.2 (*)    HCT 32.6 (*)    RDW 16.5 (*)    Lymphs Abs 0.1 (*)  nRBC 1 (*)    All other components within normal limits  BRAIN NATRIURETIC PEPTIDE   ____________________________________________  EKG  ED ECG REPORT I, Arta Silence, the attending physician,  personally viewed and interpreted this ECG.  Date: 11/03/2017 EKG Time: 1402 Rate: 97 Rhythm: normal sinus rhythm QRS Axis: normal Intervals: RBBB, LAFB ST/T Wave abnormalities: LVH with repolarization abnormality Narrative Interpretation: no evidence of acute ischemia; no significant change when compared to EKG of 11/18/2015  ____________________________________________  RADIOLOGY  CXR: Left perihilar opacity, possible atelectasis  ____________________________________________   PROCEDURES  Procedure(s) performed: No  Procedures  Critical Care performed: No ____________________________________________   INITIAL IMPRESSION / ASSESSMENT AND PLAN / ED COURSE  Pertinent labs & imaging results that were available during my care of the patient were reviewed by me and considered in my medical decision making (see chart for details).  67 year old female with PMH as noted above including breast cancer with ongoing treatment who presents with shortness of breath over the last week.  I reviewed the past medical records in epic and confirmed the patient's cancer history.  The patient currently is being treated with Ibrance and Zometa.  On exam the patient is slightly tachycardic but her other vital signs are normal.  However she does have desaturation to the 80s when she walks across the room.  The remainder of the exam is as described above.  Differential includes pneumonia, acute bronchitis, or less likely PE.  I have a low suspicion for cardiac etiology.  We will obtain chest x-ray and labs.  If no obvious etiology from the chest x-ray I will consider CT chest to rule out PE.  ----------------------------------------- 6:45 PM on 11/03/2017 -----------------------------------------  Chest x-ray showed some possible perihilar opacity but no clear pneumonia or edema.  I proceeded with CT chest which was negative for PE but showed bilateral groundglass opacities consistent with  possible multifocal pneumonia (versus early edema).  I will treat empirically for community acquired pneumonia.  Given her hypoxia on exertion, she will require admission.  I signed the patient out to the hospitalist Dr. Estanislado Pandy. ____________________________________________   FINAL CLINICAL IMPRESSION(S) / ED DIAGNOSES  Final diagnoses:  Atypical pneumonia      NEW MEDICATIONS STARTED DURING THIS VISIT:  New Prescriptions   No medications on file     Note:  This document was prepared using Dragon voice recognition software and may include unintentional dictation errors.   Arta Silence, MD 11/03/17 [phone]

## 2017-11-03 NOTE — ED Triage Notes (Signed)
Pt here for [facility] X 1 week. Breast CA pt with metastasis.  Unlabored but tachypnea present.  No fevers per pt.  No pain.  Finished RT last week.

## 2017-11-03 NOTE — ED Notes (Signed)
Pt taken to imaging

## 2017-11-03 NOTE — ED Notes (Addendum)
Patient ambulated to and from the room commode with a steady gait. Patient was labored with sats at 86%. Patient placed on 2L O2 via [place].

## 2017-11-04 LAB — BASIC METABOLIC PANEL
ANION GAP: 11 (ref 5–15)
BUN: 23 mg/dL (ref 8–23)
CO2: 24 mmol/L (ref 22–32)
Calcium: 8.3 mg/dL — ABNORMAL LOW (ref 8.9–10.3)
Chloride: 104 mmol/L (ref 98–111)
Creatinine, Ser: 1.21 mg/dL — ABNORMAL HIGH (ref 0.44–1.00)
GFR calc Af Amer: 53 mL/min — ABNORMAL LOW (ref >60)
GFR, EST NON AFRICAN AMERICAN: 46 mL/min — AB (ref >60)
Glucose, Bld: 184 mg/dL — ABNORMAL HIGH (ref 70–99)
POTASSIUM: 4 mmol/L (ref 3.5–5.1)
SODIUM: 139 mmol/L (ref 135–145)

## 2017-11-04 LAB — GLUCOSE, CAPILLARY
Glucose-Capillary: 136 mg/dL — ABNORMAL HIGH (ref 70–99)
Glucose-Capillary: 97 mg/dL (ref 70–99)

## 2017-11-04 LAB — CBC
HCT: 30 % — ABNORMAL LOW (ref 36.0–46.0)
Hemoglobin: 9.4 g/dL — ABNORMAL LOW (ref 12.0–15.0)
MCH: 31.1 pg (ref 26.0–34.0)
MCHC: 31.3 g/dL (ref 30.0–36.0)
MCV: 99.3 fL (ref 80.0–100.0)
Platelets: 218 K/uL (ref 150–400)
RBC: 3.02 MIL/uL — ABNORMAL LOW (ref 3.87–5.11)
RDW: 16.3 % — ABNORMAL HIGH (ref 11.5–15.5)
WBC: 2.9 K/uL — ABNORMAL LOW (ref 4.0–10.5)
nRBC: 0 % (ref 0.0–0.2)

## 2017-11-04 MED ORDER — INSULIN ASPART 100 UNIT/ML ~~LOC~~ SOLN
0.0000 [IU] | Freq: Three times a day (TID) | SUBCUTANEOUS | Status: DC
  Administered 2017-11-04: 1 [IU] via SUBCUTANEOUS
  Administered 2017-11-05: 3 [IU] via SUBCUTANEOUS
  Administered 2017-11-05 – 2017-11-06 (×2): 1 [IU] via SUBCUTANEOUS
  Filled 2017-11-04 (×4): qty 1

## 2017-11-04 MED ORDER — DEXAMETHASONE 4 MG PO TABS
4.0000 mg | ORAL_TABLET | ORAL | Status: DC
  Administered 2017-11-05: 4 mg via ORAL
  Filled 2017-11-04: qty 1

## 2017-11-04 MED ORDER — INSULIN ASPART 100 UNIT/ML ~~LOC~~ SOLN: 0.0000 [IU] | Freq: Every day | SUBCUTANEOUS | Status: DC

## 2017-11-04 NOTE — Progress Notes (Signed)
[facility] [place] NAME: Amanda [name]    MR#:  [id number]  DATE OF BIRTH:  Jun 08, 1950  SUBJECTIVE:  CHIEF COMPLAINT:   Chief Complaint  Patient presents with  . Shortness of Breath   -Breathing is slightly improved.  Still on 2 L oxygen. -No fevers overnight.  REVIEW OF SYSTEMS:  Review of Systems  Constitutional: Positive for fever and malaise/fatigue. Negative for chills.  HENT: Negative for congestion, ear discharge, hearing loss and nosebleeds.   Respiratory: Positive for shortness of breath. Negative for cough and wheezing.   Cardiovascular: Negative for chest pain, palpitations and leg swelling.  Gastrointestinal: Negative for abdominal pain, constipation, diarrhea, nausea and vomiting.  Genitourinary: Negative for dysuria.  Musculoskeletal: Negative for myalgias.  Neurological: Negative for dizziness, focal weakness, seizures, weakness and headaches.  Psychiatric/Behavioral: Negative for depression.    DRUG ALLERGIES:   Allergies  Allergen Reactions  . Ace Inhibitors Cough  . Latex Itching  . Morphine And Related Itching    Caused her to itch terribly. Would prefer if given to take with a benadryl  . Other Itching    Freeze spray. Patient stated that she may be able to use it now because she doesn't use it as often.  Marland Kitchen Penicillins Rash    Has patient had a PCN reaction causing immediate rash, facial/tongue/throat swelling, SOB or lightheadedness with hypotension: No Has patient had a PCN reaction causing severe rash involving mucus membranes or skin necrosis: No Has patient had a PCN reaction that required hospitalization No Has patient had a PCN reaction occurring within the last 10 years: No If all of the above answers are "NO", then may proceed with Cephalosporin use.    VITALS:  Blood pressure (!) 150/98, pulse 81, temperature 97.9 F (36.6 C), temperature source Oral, resp. rate 17, weight 88.6 kg, SpO2 98  %.  PHYSICAL EXAMINATION:  Physical Exam   GENERAL:  67 y.o.-year-old patient lying in the bed with no acute distress.  EYES: Pupils equal, round, reactive to light and accommodation. No scleral icterus. Extraocular muscles intact.  HEENT: Head atraumatic, normocephalic. Oropharynx and nasopharynx clear.  NECK:  Supple, no jugular venous distention. No thyroid enlargement, no tenderness.  LUNGS: Coarse breath sounds bilaterally at the bases, no wheezing.  No rhonchi or rails.  Moving air otherwise.. No use of accessory muscles of respiration.  CARDIOVASCULAR: S1, S2 normal. No  rubs, or gallops.  2/6 systolic murmur is present ABDOMEN: Soft, nontender, nondistended. Bowel sounds present. No organomegaly or mass.  EXTREMITIES: No pedal edema, cyanosis, or clubbing.  NEUROLOGIC: Cranial nerves II through XII are intact. Muscle strength 5/5 in all extremities. Sensation intact. Gait not checked.  PSYCHIATRIC: The patient is alert and oriented x 3.  SKIN: No obvious rash, lesion, or ulcer.    LABORATORY PANEL:   CBC Recent Labs  Lab 11/04/17 0609  WBC 2.9*  HGB 9.4*  HCT 30.0*  PLT 218   ------------------------------------------------------------------------------------------------------------------  Chemistries  Recent Labs  Lab 11/04/17 0609  NA 139  K 4.0  CL 104  CO2 24  GLUCOSE 184*  BUN 23  CREATININE 1.21*  CALCIUM 8.3*   ------------------------------------------------------------------------------------------------------------------  Cardiac Enzymes Recent Labs  Lab 11/03/17 1430  TROPONINI 0.03*   ------------------------------------------------------------------------------------------------------------------  RADIOLOGY:  Dg Chest 2 View  Result Date: 11/03/2017 CLINICAL DATA:  Shortness of breath.  History of breast cancer. EXAM: CHEST - 2 VIEW COMPARISON:  None. FINDINGS: The heart size and  mediastinal contours are within normal limits. No  pneumothorax or pleural effusion is noted. Right lung is clear. Minimal left perihilar subsegmental atelectasis or inflammation is noted. Right internal jugular Port-A-Cath is in good position. Status post right shoulder arthroplasty. IMPRESSION: Minimal left perihilar subsegmental atelectasis or inflammation. Electronically Signed   By: Marijo Conception, M.D.   On: 11/03/2017 15:00   Ct Angio Chest Pe W And/or Wo Contrast  Result Date: 11/03/2017 CLINICAL DATA:  Increasing shortness of breath. Patient with metastatic breast cancer, being treated with oral chemotherapy and radiation therapy to the spine. EXAM: CT ANGIOGRAPHY CHEST WITH CONTRAST TECHNIQUE: Multidetector CT imaging of the chest was performed using the standard protocol during bolus administration of intravenous contrast. Multiplanar CT image reconstructions and MIPs were obtained to evaluate the vascular anatomy. CONTRAST:  46mL ISOVUE-370 IOPAMIDOL (ISOVUE-370) INJECTION 76% COMPARISON:  None. FINDINGS: Cardiovascular: Satisfactory opacification of the pulmonary arteries to the segmental level. No evidence of pulmonary embolism. Normal heart size. Minimal pericardial thickening versus pericardial effusion. Mild calcific atherosclerotic disease of the coronary arteries. Right internal jugular approach central venous catheter terminates at the cavoatrial junction. Mediastinum/Nodes: No enlarged mediastinal, hilar, or axillary lymph nodes. Thyroid gland, and trachea demonstrate no significant findings. Mild thickening of the distal esophagus. Small hiatal hernia. Lungs/Pleura: Diffusely scattered bilateral ground-glass opacities in the lung parenchyma with dependent predominance. Possible radiation changes in the anterior left upper lobe and lingula. Upper Abdomen: No acute abnormality. Musculoskeletal: Known widespread skeletal sclerotic metastatic disease, involving the sternum, bilateral ribs, and the spine. Possible healed pathologic fracture in  the right lateral rib. Review of the MIP images confirms the above findings. IMPRESSION: No evidence of pulmonary embolus. Diffusely scattered bilateral ground-glass opacities in the lung parenchyma with dependent predominance. This may represent multifocal pneumonia, hypersensitivity pneumonitis or developing interstitial pulmonary edema. Minimal pericardial effusion versus pericardial thickening. Known widespread skeletal metastatic disease. Electronically Signed   By: Fidela Salisbury M.D.   On: 11/03/2017 17:29    EKG:   Orders placed or performed in visit on 11/03/17  . EKG 12-Lead    ASSESSMENT AND PLAN:   67 year old female with past medical history significant for stage IV breast cancer with bone mets on chemotherapy, migraine headaches, GERD, diabetes presents to hospital secondary to 1 week of shortness of breath  1.  Community-acquired pneumonia-CT angiogram done showing no pulmonary embolism but multifocal pneumonia and some pulmonary edema -Started on Rocephin and azithromycin -Acutely needing 2 L oxygen.  Start incentive spirometry and wean as tolerated -Echocardiogram as outpatient showing EF of 55%.  2.  Leukopenia-chronic leukopenia-continue to monitor secondary to underlying history of cancer and chemotherapy.  Follows with oncology as outpatient  3.  Stage IV metastatic breast cancer-status post left mastectomy.  Received radiation to her bone mets recently.  On Ibrance and Zometa at the cancer center. -Continue to follow-up with Dr. Grayland Ormond as outpatient  4.  Diabetes mellitus-on metformin, will add sliding scale insulin.  5.  Chronic neuropathy-peripheral neuropathy as a side effect to her chemo drugs, continue Neurontin for now.  6.  DVT prophylaxis-subcutaneous heparin \  Physical therapy consulted   All the records are reviewed and case discussed with Care Management/Social Workerr. Management plans discussed with the patient, family and they are in  agreement.  CODE STATUS: Full code  TOTAL TIME TAKING CARE OF THIS PATIENT: 38 minutes.   POSSIBLE D/C IN 2 DAYS, DEPENDING ON CLINICAL CONDITION.   Gladstone Lighter M.D on 11/04/2017 at 11:55 AM  Between 7am to 6pm - Pager - [phone]  After 6pm go to [email or web address] - password [email or web address] [facility]  Office  [phone]  CC: Primary care physician; Sofie Hartigan, MD

## 2017-11-04 NOTE — Progress Notes (Signed)
SATURATION QUALIFICATIONS:   Patient Saturations on Room Air at Rest = 94%  Patient Saturations on Room Air while Ambulating = 85%  Patient Saturations on 2 Liters of oxygen while Ambulating = 93%  Please briefly explain why patient needs home oxygen: Stats drop when ambulating.

## 2017-11-05 ENCOUNTER — Inpatient Hospital Stay: Payer: Medicare Other

## 2017-11-05 LAB — GLUCOSE, CAPILLARY
GLUCOSE-CAPILLARY: 84 mg/dL (ref 70–99)
GLUCOSE-CAPILLARY: 145 mg/dL — AB (ref 70–99)
Glucose-Capillary: 222 mg/dL — ABNORMAL HIGH (ref 70–99)
Glucose-Capillary: 115 mg/dL — ABNORMAL HIGH (ref 70–99)

## 2017-11-05 MED ORDER — AZITHROMYCIN 500 MG PO TABS
500.0000 mg | ORAL_TABLET | Freq: Every day | ORAL | Status: DC
  Administered 2017-11-05 – 2017-11-06 (×2): 500 mg via ORAL
  Filled 2017-11-05 (×3): qty 1

## 2017-11-05 NOTE — Progress Notes (Signed)
[facility]  Next of kin: Liala Codispoti [phone] office  If patient discharges after hours, please call [phone].   Florene Glen 11/05/2017, 4:04 PM

## 2017-11-05 NOTE — Care Management Note (Signed)
Case Management Note  Patient Details  Name: Amanda [name] MRN: [id number] Date of Birth: 12-21-1950  Subjective/Objective:   Met with patient at bedside to discuss discharge planning.  She lives with her spouse. Uses a cane and a walker. Offered a list of home care agencies. She is agreeable and prefers [facility] for PT and RN. [facility] to [facility]. It is anticipated patient will be disharged home tomorrow. Followed by oncology for Stage IV breast CA                 Action/Plan:   Expected Discharge Date:                  Expected Discharge Plan:  [place]  In-House Referral:     Discharge planning Services  CM Consult  Post Acute Care Choice:  Home Health Choice offered to:  Patient  DME Arranged:    DME Agency:     HH Arranged:  RN, PT [place] Agency:  [place]  Status of Service:  In process, will continue to follow  If discussed at Long Length of Stay Meetings, dates discussed:    Additional Comments:  Jolly Mango, RN 11/05/2017, 4:10 PM

## 2017-11-05 NOTE — Progress Notes (Signed)
[facility] [place] NAME: Amanda [name]    MR#:  [id number]  DATE OF BIRTH:  [birth year]  SUBJECTIVE:  CHIEF COMPLAINT:   Chief Complaint  Patient presents with  . Shortness of Breath   -Breathing is improved.  Still on 2 L oxygen. -No fevers.  REVIEW OF SYSTEMS:  Review of Systems  Constitutional: Positive for malaise/fatigue. Negative for chills and fever.  HENT: Negative for congestion, ear discharge, hearing loss and nosebleeds.   Respiratory: Positive for shortness of breath. Negative for cough and wheezing.   Cardiovascular: Negative for chest pain, palpitations and leg swelling.  Gastrointestinal: Negative for abdominal pain, constipation, diarrhea, nausea and vomiting.  Genitourinary: Negative for dysuria.  Musculoskeletal: Negative for myalgias.  Neurological: Negative for dizziness, focal weakness, seizures, weakness and headaches.  Psychiatric/Behavioral: Negative for depression.    DRUG ALLERGIES:   Allergies  Allergen Reactions  . Ace Inhibitors Cough  . Latex Itching  . Morphine And Related Itching    Caused her to itch terribly. Would prefer if given to take with a benadryl  . Other Itching    Freeze spray. Patient stated that she may be able to use it now because she doesn't use it as often.  Marland Kitchen Penicillins Rash    Has patient had a PCN reaction causing immediate rash, facial/tongue/throat swelling, SOB or lightheadedness with hypotension: No Has patient had a PCN reaction causing severe rash involving mucus membranes or skin necrosis: No Has patient had a PCN reaction that required hospitalization No Has patient had a PCN reaction occurring within the last 10 years: No If all of the above answers are "NO", then may proceed with Cephalosporin use.    VITALS:  Blood pressure 121/83, pulse 77, temperature 98.1 F (36.7 C), temperature source Oral, resp. rate 18, weight 88.6 kg, SpO2 98 %.  PHYSICAL  EXAMINATION:  Physical Exam   GENERAL:  67 y.o.-year-old patient lying in the bed with no acute distress.  EYES: Pupils equal, round, reactive to light and accommodation. No scleral icterus. Extraocular muscles intact.  HEENT: Head atraumatic, normocephalic. Oropharynx and nasopharynx clear.  NECK:  Supple, no jugular venous distention. No thyroid enlargement, no tenderness.  LUNGS: Coarse breath sounds bilaterally at the bases, no wheezing.  No rhonchi or rails.  Moving air otherwise.. No use of accessory muscles of respiration.  CARDIOVASCULAR: S1, S2 normal. No  rubs, or gallops.  2/6 systolic murmur is present ABDOMEN: Soft, nontender, nondistended. Bowel sounds present. No organomegaly or mass.  EXTREMITIES: No pedal edema, cyanosis, or clubbing.  NEUROLOGIC: Cranial nerves II through XII are intact. Muscle strength 5/5 in all extremities. Sensation intact. Gait not checked.  PSYCHIATRIC: The patient is alert and oriented x 3.  SKIN: No obvious rash, lesion, or ulcer.    LABORATORY PANEL:   CBC Recent Labs  Lab 11/04/17 0609  WBC 2.9*  HGB 9.4*  HCT 30.0*  PLT 218   ------------------------------------------------------------------------------------------------------------------  Chemistries  Recent Labs  Lab 11/04/17 0609  NA 139  K 4.0  CL 104  CO2 24  GLUCOSE 184*  BUN 23  CREATININE 1.21*  CALCIUM 8.3*   ------------------------------------------------------------------------------------------------------------------  Cardiac Enzymes Recent Labs  Lab 11/03/17 1430  TROPONINI 0.03*   ------------------------------------------------------------------------------------------------------------------  RADIOLOGY:  Dg Chest 2 View  Result Date: 11/05/2017 CLINICAL DATA:  Shortness of breath.  History of breast carcinoma EXAM: CHEST - 2 VIEW COMPARISON:  Chest radiograph and chest CT November 03, 2017 FINDINGS:  There is no edema or consolidation. There is  patchy atelectatic change bilaterally. Heart size and pulmonary vascularity are normal. There is aortic atherosclerosis. Port-A-Cath tip is at the cavoatrial junction. No pneumothorax. No adenopathy. Status post total shoulder replacement on the right. No blastic or lytic bone lesions. IMPRESSION: Areas of scattered atelectatic change. No edema or consolidation. No adenopathy. Heart size normal. There is aortic atherosclerosis. Aortic Atherosclerosis (ICD10-I70.0). Electronically Signed   By: Lowella Grip III M.D.   On: 11/05/2017 11:57   Dg Chest 2 View  Result Date: 11/03/2017 CLINICAL DATA:  Shortness of breath.  History of breast cancer. EXAM: CHEST - 2 VIEW COMPARISON:  None. FINDINGS: The heart size and mediastinal contours are within normal limits. No pneumothorax or pleural effusion is noted. Right lung is clear. Minimal left perihilar subsegmental atelectasis or inflammation is noted. Right internal jugular Port-A-Cath is in good position. Status post right shoulder arthroplasty. IMPRESSION: Minimal left perihilar subsegmental atelectasis or inflammation. Electronically Signed   By: Marijo Conception, M.D.   On: 11/03/2017 15:00   Ct Angio Chest Pe W And/or Wo Contrast  Result Date: 11/03/2017 CLINICAL DATA:  Increasing shortness of breath. Patient with metastatic breast cancer, being treated with oral chemotherapy and radiation therapy to the spine. EXAM: CT ANGIOGRAPHY CHEST WITH CONTRAST TECHNIQUE: Multidetector CT imaging of the chest was performed using the standard protocol during bolus administration of intravenous contrast. Multiplanar CT image reconstructions and MIPs were obtained to evaluate the vascular anatomy. CONTRAST:  26mL ISOVUE-370 IOPAMIDOL (ISOVUE-370) INJECTION 76% COMPARISON:  None. FINDINGS: Cardiovascular: Satisfactory opacification of the pulmonary arteries to the segmental level. No evidence of pulmonary embolism. Normal heart size. Minimal pericardial thickening  versus pericardial effusion. Mild calcific atherosclerotic disease of the coronary arteries. Right internal jugular approach central venous catheter terminates at the cavoatrial junction. Mediastinum/Nodes: No enlarged mediastinal, hilar, or axillary lymph nodes. Thyroid gland, and trachea demonstrate no significant findings. Mild thickening of the distal esophagus. Small hiatal hernia. Lungs/Pleura: Diffusely scattered bilateral ground-glass opacities in the lung parenchyma with dependent predominance. Possible radiation changes in the anterior left upper lobe and lingula. Upper Abdomen: No acute abnormality. Musculoskeletal: Known widespread skeletal sclerotic metastatic disease, involving the sternum, bilateral ribs, and the spine. Possible healed pathologic fracture in the right lateral rib. Review of the MIP images confirms the above findings. IMPRESSION: No evidence of pulmonary embolus. Diffusely scattered bilateral ground-glass opacities in the lung parenchyma with dependent predominance. This may represent multifocal pneumonia, hypersensitivity pneumonitis or developing interstitial pulmonary edema. Minimal pericardial effusion versus pericardial thickening. Known widespread skeletal metastatic disease. Electronically Signed   By: Fidela Salisbury M.D.   On: 11/03/2017 17:29    EKG:   Orders placed or performed in visit on 11/03/17  . EKG 12-Lead    ASSESSMENT AND PLAN:   67 year old female with past medical history significant for stage IV breast cancer with bone mets on chemotherapy, migraine headaches, GERD, diabetes presents to hospital secondary to 1 week of shortness of breath  1.  Community-acquired pneumonia-CT angiogram done showing no pulmonary embolism but multifocal pneumonia and some pulmonary edema -on Rocephin and azithromycin -Acutely needing 2 L oxygen.  incentive spirometry and wean as tolerated -Echocardiogram as outpatient showing EF of 55%.  2.  Leukopenia-chronic  leukopenia-continue to monitor secondary to underlying history of cancer and chemotherapy.  Follows with oncology as outpatient  3.  Stage IV metastatic breast cancer-status post left mastectomy.  Received radiation to her bone mets recently.  On  Ibrance and Zometa at the cancer center. -Continue to follow-up with Dr. Grayland Ormond as outpatient  4.  Diabetes mellitus-on metformin,  sliding scale insulin.  5.  Chronic neuropathy-peripheral neuropathy as a side effect to her chemo drugs, continue Neurontin for now.  6.  DVT prophylaxis-subcutaneous heparin   Physical therapy consulted- home health at discharge   All the records are reviewed and case discussed with Care Management/Social Workerr. Management plans discussed with the patient, family and they are in agreement.  CODE STATUS: Full code  TOTAL TIME TAKING CARE OF THIS PATIENT: 38 minutes.   POSSIBLE D/C IN 2 DAYS, DEPENDING ON CLINICAL CONDITION.   Dalina Samara M.D on 11/05/2017 at 2:10 PM  Between 7am to 6pm - Pager - [phone]  After 6pm go to [email or web address] - password [email or web address] [facility]  Office  [phone]  CC: Primary care physician; Sofie Hartigan, MD

## 2017-11-05 NOTE — Evaluation (Signed)
Physical Therapy Evaluation Patient Details Name: Amanda [name] MRN: [id number] DOB: 1950-08-17 Today's Date: 11/05/2017   History of Present Illness  67 y.o. female with a known history of metastatic breast cancer, migraine, osteoarthritis, GERD, diabetes mellitus type presented to the emergency room for shortness of breath for couple of days.  Has occasional cough and also has generalized weakness. Currently undergoing chemotherapy  Clinical Impression  Patient A&Ox4 at start of session, no complaints of pain. Patient reports living at home in one story home with husband and family who is available for 24/7 assist if needed. Reports being independent with ambulation/mobility, does need assistance with cooking/cleaning/household chores from husband due to fatigue. Has RW and canes at home and uses them as needed. Patient on O2 at 2L via [place] throughout session. Patient mobilized to EOB with mod I, sit <> stand with CGA and no AD, immediate standing balance good. Patient ambulated ~180ft with CGA/supervision. 1 standing rest break needed due to spO2 less than 90%, HR 118 BPM, spO2 recovered quickly and able to continue to ambulate back to room. Patient reported fatigue at end of session, vitals stable. The patient would benefit from further skilled PT to address limitations in strength, endurance, activity tolerance to improve safety, independence, and mobility.     Follow Up Recommendations Home health PT    Equipment Recommendations  None recommended by PT(Pt reports having RW, cane, bedside commode at home)    Recommendations for Other Services       Precautions / Restrictions Precautions Precautions: Fall Restrictions Weight Bearing Restrictions: No      Mobility  Bed Mobility Overal bed mobility: Modified Independent                Transfers Overall transfer level: Needs assistance   Transfers: Sit to/from Stand Sit to Stand: Min guard;Supervision             Ambulation/Gait Ambulation/Gait assistance: Min guard;Supervision Gait Distance (Feet): 180 Feet Assistive device: None       General Gait Details: decreased gait speed, 1 standing rest break due to spO2  less than 90%, HR 118 BPM. 1 sway noticed during ambulation but patient able to self correct.  Stairs            Wheelchair Mobility    Modified Rankin (Stroke Patients Only)       Balance Overall balance assessment: Mild deficits observed, not formally tested Sitting-balance support: Feet supported Sitting balance-Leahy Scale: Good       Standing balance-Leahy Scale: Good                               Pertinent Vitals/Pain Pain Assessment: No/denies pain    Home Living Family/patient expects to be discharged to:: Private residence Living Arrangements: Spouse/significant other Available Help at Discharge: Family;Available 24 hours/day Type of Home: House Home Access: Level entry     Home Layout: One level(3 stairs to bedroom) Home Equipment: Gilford Rile - 2 wheels;Cane - single point;Bedside commode;Hand held shower head;Shower seat;Wheelchair - [employer] - 4 wheels      Prior Function Level of Independence: Independent         Comments: Independent except with household cleaning/chores, husband assists     Hand Dominance   Dominant Hand: Right    Extremity/Trunk Assessment   Upper Extremity Assessment Upper Extremity Assessment: Generalized weakness;RUE deficits/detail;LUE deficits/detail RUE Deficits / Details: grossly 3+/5 LUE Deficits / Details: grossly 3+/5  Lower Extremity Assessment Lower Extremity Assessment: Generalized weakness;RLE deficits/detail;LLE deficits/detail RLE Deficits / Details: grossly 4-/5 LLE Deficits / Details: grossly 4-/5       Communication   Communication: No difficulties  Cognition Arousal/Alertness: Awake/alert Behavior During Therapy: WFL for tasks assessed/performed Overall Cognitive  Status: Within Functional Limits for tasks assessed                                        General Comments      Exercises     Assessment/Plan    PT Assessment Patient needs continued PT services  PT Problem List Decreased strength;Decreased activity tolerance;Decreased balance;Decreased mobility       PT Treatment Interventions DME instruction;Balance training;Gait training;Neuromuscular re-education;Stair training;Functional mobility training;Patient/family education;Therapeutic activities;Therapeutic exercise    PT Goals (Current goals can be found in the Care Plan section)  Acute Rehab PT Goals Patient Stated Goal: Patient would like to go home safely PT Goal Formulation: With patient Time For Goal Achievement: 11/19/17 Potential to Achieve Goals: Good    Frequency Min 2X/week   Barriers to discharge        Co-evaluation               AM-PAC PT "6 Clicks" Daily Activity  Outcome Measure Difficulty turning over in bed (including adjusting bedclothes, sheets and blankets)?: A Little Difficulty moving from lying on back to sitting on the side of the bed? : A Little Difficulty sitting down on and standing up from a chair with arms (e.g., wheelchair, bedside commode, etc,.)?: Unable Help needed moving to and from a bed to chair (including a wheelchair)?: A Little Help needed walking in hospital room?: A Little Help needed climbing 3-5 steps with a railing? : A Little 6 Click Score: 16    End of Session Equipment Utilized During Treatment: Gait belt;Oxygen Activity Tolerance: Patient tolerated treatment well;Patient limited by fatigue Patient left: with chair alarm set;in chair;with call bell/phone within reach Nurse Communication: Mobility status PT Visit Diagnosis: Other abnormalities of gait and mobility (R26.89);Muscle weakness (generalized) (M62.81)    Time: 5597-4163 PT Time Calculation (min) (ACUTE ONLY): 23 min   Charges:   PT  Evaluation $PT Eval Moderate Complexity: 1 Mod PT Treatments $Therapeutic Activity: 8-22 mins       Amanda [name] PT, DPT 10:29 AM,11/05/17 [phone]

## 2017-11-06 LAB — BASIC METABOLIC PANEL
Anion gap: 10 (ref 5–15)
BUN: 18 mg/dL (ref 8–23)
CALCIUM: 8 mg/dL — AB (ref 8.9–10.3)
CHLORIDE: 107 mmol/L (ref 98–111)
CO2: 22 mmol/L (ref 22–32)
Creatinine, Ser: 1.18 mg/dL — ABNORMAL HIGH (ref 0.44–1.00)
GFR calc Af Amer: 54 mL/min — ABNORMAL LOW (ref >60)
GFR calc non Af Amer: 47 mL/min — ABNORMAL LOW (ref >60)
GLUCOSE: 155 mg/dL — AB (ref 70–99)
POTASSIUM: 4.4 mmol/L (ref 3.5–5.1)
Sodium: 139 mmol/L (ref 135–145)

## 2017-11-06 LAB — LEGIONELLA PNEUMOPHILA SEROGP 1 UR AG: L. pneumophila Serogp 1 Ur Ag: NEGATIVE

## 2017-11-06 LAB — CBC
HEMATOCRIT: 30.2 % — AB (ref 36.0–46.0)
HEMOGLOBIN: 9.8 g/dL — AB (ref 12.0–15.0)
MCH: 31.6 pg (ref 26.0–34.0)
MCHC: 32.5 g/dL (ref 30.0–36.0)
MCV: 97.4 fL (ref 80.0–100.0)
Platelets: 223 10*3/uL (ref 150–400)
RBC: 3.1 MIL/uL — AB (ref 3.87–5.11)
RDW: 15.9 % — ABNORMAL HIGH (ref 11.5–15.5)
WBC: 2.8 10*3/uL — ABNORMAL LOW (ref 4.0–10.5)
nRBC: 0 % (ref 0.0–0.2)

## 2017-11-06 LAB — HEMOGLOBIN A1C
HEMOGLOBIN A1C: 7 % — AB (ref 4.8–5.6)
Mean Plasma Glucose: 154 mg/dL

## 2017-11-06 LAB — GLUCOSE, CAPILLARY
Glucose-Capillary: 124 mg/dL — ABNORMAL HIGH (ref 70–99)
Glucose-Capillary: 119 mg/dL — ABNORMAL HIGH (ref 70–99)

## 2017-11-06 LAB — HIV ANTIBODY (ROUTINE TESTING W REFLEX): HIV Screen 4th Generation wRfx: NONREACTIVE

## 2017-11-06 MED ORDER — CEFUROXIME AXETIL 500 MG PO TABS: 500.0000 mg | ORAL_TABLET | Freq: Two times a day (BID) | ORAL | 0 refills | Status: AC

## 2017-11-06 NOTE — Care Management Note (Signed)
Case Management Note  Patient Details  Name: Amanda [name] MRN: [id number] Date of Birth: [birth year]  Subjective/Objective:   Discharging today                 Action/Plan: Advanced made aware of discharge.  Expected Discharge Date:  11/06/17               Expected Discharge Plan:  [place]  In-House Referral:     Discharge planning Services  CM Consult  Post Acute Care Choice:  Home Health Choice offered to:  Patient  DME Arranged:    DME Agency:     HH Arranged:  RN, PT [place] Agency:  [facility]  Status of Service:  In process, will continue to follow  If discussed at Long Length of Stay Meetings, dates discussed:    Additional Comments:  Jolly Mango, RN 11/06/2017, 1:51 PM

## 2017-11-06 NOTE — Progress Notes (Signed)
Discharge paperwork and prescription given to pt. Port on right chest de-accessed. Pt's home medications returned to pt from pharmacy. Pt dressed. Awaiting grandson to pick up and discharge home. Pt has no questions at this time.

## 2017-11-06 NOTE — Care Management Important Message (Signed)
Important Message  Patient Details  Name: NATASSIA GUTHRIDGE MRN: [id number] Date of Birth: 12/04/1950   Medicare Important Message Given:  Yes    Juliann Pulse A Nalda Shackleford 11/06/2017, 10:28 AM

## 2017-11-06 NOTE — Discharge Summary (Signed)
[facility] NAME: Amanda [name]    MR#:  [id number]  DATE OF BIRTH:  1950/05/09  DATE OF ADMISSION:  11/03/2017   ADMITTING PHYSICIAN: Saundra Shelling, MD  DATE OF DISCHARGE:  11/06/17  PRIMARY CARE PHYSICIAN: Sofie Hartigan, MD   ADMISSION DIAGNOSIS:   Atypical pneumonia [J18.9]  DISCHARGE DIAGNOSIS:   Active Problems:   CAP (community acquired pneumonia)   SECONDARY DIAGNOSIS:   Past Medical History:  Diagnosis Date  . Back pain    occasionally  . Breast cancer ([place]) 2009   left  . Depression    takes Paxil and Wellbutrin daily  . Diabetes ([place])    takes Metformin daily  . Genetic testing 02/03/2017   Multi-Cancer panel (83 genes) @ [facility] - No pathogenic mutations detected  . GERD (gastroesophageal reflux disease)   . History of bronchitis 2 yrs ago  . Hyperlipidemia    takes Atorvastatin daily  . Hypertension    takes Losartan-HCTZ daily  . Insomnia    takes Ambien nightly  . Insomnia    takes gabapentin nightly  . Joint pain   . Migraine   . Mood swings   . Osteoarthritis of knee   . Personal history of chemotherapy 12/05/2016   Mets from Breast Cancer  . Personal history of radiation therapy 11/2016  . PONV (postoperative nausea and vomiting)   . Seasonal allergies    takes Allegra daily    HOSPITAL COURSE:   67 year old female with past medical history significant for stage IV breast cancer with bone mets on chemotherapy, migraine headaches, GERD, diabetes presents to hospital secondary to 1 week of shortness of breath  1.  Community-acquired pneumonia-CT angiogram done showing no pulmonary embolism but multifocal pneumonia and some pulmonary edema -on Rocephin and azithromycin- change to ceftin at discharge -Acutely needing 2 L oxygen- weaned off now. -  incentive spirometry and wean as tolerated -Echocardiogram as outpatient showing EF of 55%. - doing well now  2.   Leukopenia-chronic leukopenia-continue to monitor secondary to underlying history of cancer and chemotherapy.  Follows with oncology as outpatient  3.  Stage IV metastatic breast cancer-status post left mastectomy.  Received radiation to her bone mets recently.  On Ibrance and Zometa at the cancer center. -Continue to follow-up with Dr. Grayland Ormond as outpatient  4.  Diabetes mellitus-on metformin  5.  Chronic neuropathy-peripheral neuropathy as a side effect to her chemo drugs, continue Neurontin for now.   Physical therapy consulted- home health at discharge   DISCHARGE CONDITIONS:   Guarded  CONSULTS OBTAINED:   None  DRUG ALLERGIES:   Allergies  Allergen Reactions  . Ace Inhibitors Cough  . Latex Itching  . Morphine And Related Itching    Caused her to itch terribly. Would prefer if given to take with a benadryl  . Other Itching    Freeze spray. Patient stated that she may be able to use it now because she doesn't use it as often.  Marland Kitchen Penicillins Rash    Has patient had a PCN reaction causing immediate rash, facial/tongue/throat swelling, SOB or lightheadedness with hypotension: No Has patient had a PCN reaction causing severe rash involving mucus membranes or skin necrosis: No Has patient had a PCN reaction that required hospitalization No Has patient had a PCN reaction occurring within the last 10 years: No If all of the above answers are "NO", then may proceed with Cephalosporin use.   DISCHARGE MEDICATIONS:  Allergies as of 11/06/2017      Reactions   Ace Inhibitors Cough   Latex Itching   Morphine And Related Itching   Caused her to itch terribly. Would prefer if given to take with a benadryl   Other Itching   Freeze spray. Patient stated that she may be able to use it now because she doesn't use it as often.   Penicillins Rash   Has patient had a PCN reaction causing immediate rash, facial/tongue/throat swelling, SOB or lightheadedness with hypotension:  No Has patient had a PCN reaction causing severe rash involving mucus membranes or skin necrosis: No Has patient had a PCN reaction that required hospitalization No Has patient had a PCN reaction occurring within the last 10 years: No If all of the above answers are "NO", then may proceed with Cephalosporin use.      Medication List    STOP taking these medications   ibuprofen 800 MG tablet Commonly known as:  ADVIL,MOTRIN     TAKE these medications   acetaminophen 500 MG tablet Commonly known as:  TYLENOL Take 500 mg by mouth every 6 (six) hours as needed for mild pain or moderate pain.   AMBIEN PO Take 10 mg by mouth at bedtime as needed.   atorvastatin 10 MG tablet Commonly known as:  LIPITOR Take 10 mg by mouth every evening.   buPROPion 150 MG 24 hr tablet Commonly known as:  WELLBUTRIN XL Take 150 mg by mouth every evening.   CALCIUM 1200+D3 PO Take 1 tablet by mouth daily.   cefUROXime 500 MG tablet Commonly known as:  CEFTIN Take 1 tablet (500 mg total) by mouth 2 (two) times daily for 5 days.   Cyanocobalamin 5000 MCG Caps Take 5,000 mcg by mouth daily.   dexamethasone 4 MG tablet Commonly known as:  DECADRON Take 1 tablet (4 mg total) by mouth every other day.   ferrous sulfate 325 (65 FE) MG EC tablet Take 325 mg by mouth daily.   fexofenadine 180 MG tablet Commonly known as:  ALLEGRA Take 180 mg by mouth at bedtime.   gabapentin 300 MG capsule Commonly known as:  NEURONTIN Take 600-900 mg by mouth See admin instructions. Take 2 capsules (600MG ) by mouth every morning, 2 capsules (600MG )by mouth  at noon and 3 capsules (900MG ) by mouth every night   letrozole 2.5 MG tablet Commonly known as:  FEMARA Take 1 tablet (2.5 mg total) by mouth daily.   lidocaine-prilocaine cream Commonly known as:  EMLA Apply 1 application topically as needed. Apply to port 1-2 hours prior to chemotherapy appointment. Cover with plastic wrap.   metFORMIN 850 MG  tablet Commonly known as:  GLUCOPHAGE Take 850 mg by mouth 2 (two) times daily with a meal.   palbociclib 75 MG capsule Commonly known as:  IBRANCE Take 1 capsule (75 mg total) by mouth daily with breakfast. Take for 14 days, then 14 days off.   pantoprazole 40 MG tablet Commonly known as:  PROTONIX Take 40 mg by mouth every evening.   PARoxetine 40 MG tablet Commonly known as:  PAXIL Take 40 mg by mouth every evening.   prochlorperazine 10 MG tablet Commonly known as:  COMPAZINE Take 1 tablet (10 mg total) by mouth every 6 (six) hours as needed for nausea or vomiting.        DISCHARGE INSTRUCTIONS:   1. PCP f/u in 1-2 weeks 2. Oncology f/u in 2 weeks  DIET:   Regular diet  ACTIVITY:   Activity as tolerated  OXYGEN:   Home Oxygen: No.  Oxygen Delivery: room air  DISCHARGE LOCATION:   home   If you experience worsening of your admission symptoms, develop shortness of breath, life threatening emergency, suicidal or homicidal thoughts you must seek medical attention immediately by calling 911 or calling your MD immediately  if symptoms less severe.  You Must read complete instructions/literature along with all the possible adverse reactions/side effects for all the Medicines you take and that have been prescribed to you. Take any new Medicines after you have completely understood and accpet all the possible adverse reactions/side effects.   Please note  You were cared for by a hospitalist during your hospital stay. If you have any questions about your discharge medications or the care you received while you were in the hospital after you are discharged, you can call the unit and asked to speak with the hospitalist on call if the hospitalist that took care of you is not available. Once you are discharged, your primary care physician will handle any further medical issues. Please note that NO REFILLS for any discharge medications will be authorized once you are  discharged, as it is imperative that you return to your primary care physician (or establish a relationship with a primary care physician if you do not have one) for your aftercare needs so that they can reassess your need for medications and monitor your lab values.    On the day of Discharge:  VITAL SIGNS:   Blood pressure 131/87, pulse 73, temperature 98.2 F (36.8 C), temperature source Oral, resp. rate 18, weight 88.6 kg, SpO2 95 %.  PHYSICAL EXAMINATION:   GENERAL:  67 y.o.-year-old patient lying in the bed with no acute distress.  EYES: Pupils equal, round, reactive to light and accommodation. No scleral icterus. Extraocular muscles intact.  HEENT: Head atraumatic, normocephalic. Oropharynx and nasopharynx clear.  NECK:  Supple, no jugular venous distention. No thyroid enlargement, no tenderness.  LUNGS: Improved breath sounds bilaterally at the bases, no wheezing.  No rhonchi or rales.  Moving air otherwise.. No use of accessory muscles of respiration.  CARDIOVASCULAR: S1, S2 normal. No  rubs, or gallops.  2/6 systolic murmur is present ABDOMEN: Soft, nontender, nondistended. Bowel sounds present. No organomegaly or mass.  EXTREMITIES: No pedal edema, cyanosis, or clubbing.  NEUROLOGIC: Cranial nerves II through XII are intact. Muscle strength 5/5 in all extremities. Sensation intact. Gait not checked.  PSYCHIATRIC: The patient is alert and oriented x 3.  SKIN: No obvious rash, lesion, or ulcer.    DATA REVIEW:   CBC Recent Labs  Lab 11/06/17 0548  WBC 2.8*  HGB 9.8*  HCT 30.2*  PLT 223    Chemistries  Recent Labs  Lab 11/06/17 0451  NA 139  K 4.4  CL 107  CO2 22  GLUCOSE 155*  BUN 18  CREATININE 1.18*  CALCIUM 8.0*     Microbiology Results  Results for orders placed or performed in visit on 02/22/16  Microscopic Examination     Status: Abnormal   Collection Time: 02/22/16  3:06 PM  Result Value Ref Range Status   WBC, UA 6-10 (A) 0 - 5 /hpf Final    RBC, UA 0-2 0 - 2 /hpf Final   Epithelial Cells (non renal) 0-10 0 - 10 /hpf Final   Bacteria, UA None seen None seen/Few Final    RADIOLOGY:  No results found.   Management plans discussed with the patient,  family and they are in agreement.  CODE STATUS:     Code Status Orders  (From admission, onward)         Start     Ordered   11/03/17 2110  Full code  Continuous     11/03/17 2109        Code Status History    This patient has a current code status but no historical code status.    Advance Directive Documentation     Most Recent Value  Type of Advance Directive  Living will, Healthcare Power of Attorney  Pre-existing out of facility DNR order (yellow form or pink MOST form)  -  "MOST" Form in Place?  -      TOTAL TIME TAKING CARE OF THIS PATIENT: 38 minutes.    Solyana Nonaka M.D on 11/06/2017 at 1:20 PM  Between 7am to 6pm - Pager - [phone]  After 6pm go to [email or web address] - Proofreader  [facility]  Office  [phone]  CC: Primary care physician; Sofie Hartigan, MD   Note: This dictation was prepared with Dragon dictation along with smaller phrase technology. Any transcriptional errors that result from this process are unintentional.

## 2017-11-06 NOTE — Progress Notes (Addendum)
Physical Therapy Treatment Patient Details Name: Amanda [name] MRN: [id number] DOB: [birth year] Today's Date: Amanda [name]    History of Present Illness 67 y.o. female with a known history of metastatic breast cancer, migraine, osteoarthritis, GERD, diabetes mellitus type presented to the emergency room for shortness of breath for couple of days.  Has occasional cough and also has generalized weakness. Currently undergoing chemotherapy    PT Comments    Pt demonstrates excellent progress with therapy on this date. She is modified independent for bed mobility and transfers. She is able to ambulate a full lap around RN station with therapist. She is able to perform horizontal and vertical head turns. Gait speed is slightly decreased but functional for limited community ambulation. SaO2 drops to 83% during ambulation. SaO2 recovers to 90% with standing rest break and pursed lip breathing x 60 seconds. She is able to ascend/descend 4 steps safely. She has some higher level balance deficits in single leg stance. She is able to complete all seated exercises as instructed by therapist. She would like Amanda [name] PT but it is unclear if she will be homebound at discharge. If not then she would be appropriate for OP PT. Pt will benefit from PT services to address deficits in strength, balance, and mobility in order to return to full function at home.   Follow Up Recommendations  Home health PT     Equipment Recommendations  None recommended by PT(Pt reports having RW, cane, bedside commode at home)    Recommendations for Other Services       Precautions / Restrictions Precautions Precautions: Fall Restrictions Weight Bearing Restrictions: No    Mobility  Bed Mobility Overal bed mobility: Modified Independent                Transfers Overall transfer level: Modified independent   Transfers: Sit to/from Stand Sit to Stand: Modified independent (Device/Increase time)         General  transfer comment: Pt demonstrates excellent stability and speed with transfers. No deficits identified  Ambulation/Gait Ambulation/Gait assistance: Min guard Gait Distance (Feet): 220 Feet Assistive device: None       General Gait Details: Pt is able to ambulate a full lap around RN station with therapist. She is able to perform horizontal and vertical head turns. Gait speed is slightly decreased but functional for limited community ambulation. SaO2 drops to 83% during ambulation. SaO2 recovers to 90% with standing rest break and pursed lip breathing x 60 seconds   Stairs Stairs: Yes Stairs assistance: Min guard Stair Management: No rails;Step to pattern Number of Stairs: 4 General stair comments: Pt is able to ascend/descend 4 steps with step-to pattern. No safety concerns identified   Wheelchair Mobility    Modified Rankin (Stroke Patients Only)       Balance Overall balance assessment: Mild deficits observed, not formally tested Sitting-balance support: Feet supported Sitting balance-Leahy Scale: Good       Standing balance-Leahy Scale: Good                              Cognition Arousal/Alertness: Awake/alert Behavior During Therapy: WFL for tasks assessed/performed Overall Cognitive Status: Within Functional Limits for tasks assessed                                        Exercises General Exercises - Lower Extremity  Long Arc Quad: Both;15 reps Heel Slides: Both;15 reps Hip ABduction/ADduction: Both;15 reps Hip Flexion/Marching: Both;15 reps Heel Raises: Both;15 reps    General Comments        Pertinent Vitals/Pain Pain Assessment: No/denies pain    Home Living                      Prior Function            PT Goals (current goals can now be found in the care plan section) Acute Rehab PT Goals Patient Stated Goal: Patient would like to go home safely PT Goal Formulation: With patient Time For Goal  Achievement: 11/19/17 Potential to Achieve Goals: Good Progress towards PT goals: Progressing toward goals    Frequency    Min 2X/week      PT Plan Current plan remains appropriate    Co-evaluation              AM-PAC PT "6 Clicks" Daily Activity  Outcome Measure  Difficulty turning over in bed (including adjusting bedclothes, sheets and blankets)?: None Difficulty moving from lying on back to sitting on the side of the bed? : None Difficulty sitting down on and standing up from a chair with arms (e.g., wheelchair, bedside commode, etc,.)?: None Help needed moving to and from a bed to chair (including a wheelchair)?: None Help needed walking in hospital room?: A Little Help needed climbing 3-5 steps with a railing? : A Little 6 Click Score: 22    End of Session Equipment Utilized During Treatment: Gait belt Activity Tolerance: Patient tolerated treatment well Patient left: in bed;with call bell/phone within reach   PT Visit Diagnosis: Other abnormalities of gait and mobility (R26.89);Muscle weakness (generalized) (M62.81)     Time: 1035-1050 PT Time Calculation (min) (ACUTE ONLY): 15 min  Charges:  $Therapeutic Exercise: 8-22 mins                     Amanda [name] PT, DPT, GCS    Amanda [name] Amanda [name], 12:00 PM

## 2017-11-08 DIAGNOSIS — Z853 Personal history of malignant neoplasm of breast: Secondary | ICD-10-CM | POA: Diagnosis not present

## 2017-11-08 DIAGNOSIS — Z9012 Acquired absence of left breast and nipple: Secondary | ICD-10-CM | POA: Diagnosis not present

## 2017-11-08 DIAGNOSIS — G620 Drug-induced polyneuropathy: Secondary | ICD-10-CM | POA: Diagnosis not present

## 2017-11-08 DIAGNOSIS — J189 Pneumonia, unspecified organism: Secondary | ICD-10-CM | POA: Diagnosis not present

## 2017-11-08 DIAGNOSIS — I10 Essential (primary) hypertension: Secondary | ICD-10-CM | POA: Diagnosis not present

## 2017-11-08 DIAGNOSIS — Z7984 Long term (current) use of oral hypoglycemic drugs: Secondary | ICD-10-CM | POA: Diagnosis not present

## 2017-11-08 DIAGNOSIS — F329 Major depressive disorder, single episode, unspecified: Secondary | ICD-10-CM | POA: Diagnosis not present

## 2017-11-08 DIAGNOSIS — D72819 Decreased white blood cell count, unspecified: Secondary | ICD-10-CM | POA: Diagnosis not present

## 2017-11-08 DIAGNOSIS — E119 Type 2 diabetes mellitus without complications: Secondary | ICD-10-CM | POA: Diagnosis not present

## 2017-11-08 DIAGNOSIS — C7951 Secondary malignant neoplasm of bone: Secondary | ICD-10-CM | POA: Diagnosis not present

## 2017-11-08 DIAGNOSIS — T451X5D Adverse effect of antineoplastic and immunosuppressive drugs, subsequent encounter: Secondary | ICD-10-CM | POA: Diagnosis not present

## 2017-11-08 DIAGNOSIS — Z79899 Other long term (current) drug therapy: Secondary | ICD-10-CM | POA: Diagnosis not present

## 2017-11-08 DIAGNOSIS — M179 Osteoarthritis of knee, unspecified: Secondary | ICD-10-CM | POA: Diagnosis not present

## 2017-11-08 DIAGNOSIS — Z792 Long term (current) use of antibiotics: Secondary | ICD-10-CM | POA: Diagnosis not present

## 2017-11-15 DIAGNOSIS — J189 Pneumonia, unspecified organism: Secondary | ICD-10-CM | POA: Diagnosis not present

## 2017-11-15 DIAGNOSIS — D72819 Decreased white blood cell count, unspecified: Secondary | ICD-10-CM | POA: Diagnosis not present

## 2017-11-15 DIAGNOSIS — T451X5D Adverse effect of antineoplastic and immunosuppressive drugs, subsequent encounter: Secondary | ICD-10-CM | POA: Diagnosis not present

## 2017-11-15 DIAGNOSIS — C7951 Secondary malignant neoplasm of bone: Secondary | ICD-10-CM | POA: Diagnosis not present

## 2017-11-15 DIAGNOSIS — G620 Drug-induced polyneuropathy: Secondary | ICD-10-CM | POA: Diagnosis not present

## 2017-11-15 DIAGNOSIS — E119 Type 2 diabetes mellitus without complications: Secondary | ICD-10-CM | POA: Diagnosis not present

## 2017-11-18 NOTE — Progress Notes (Signed)
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  INTERVAL HISTORY: Patient returns to clinic today for further evaluation and continuation of Ibrance and Zometa.  She was admitted to the hospital with pneumonia for several days, but now is fully recovered and back to her baseline.  She is also having some dental issues, but these are asymptomatic.  She does not complain of weakness or fatigue today. She continues to have a chronic peripheral neuropathy, but no other neurologic complaints.  She denies any recent fevers or illnesses.  She denies any chest pain, cough, hemoptysis, or shortness of breath.  Her appetite has significantly improved.  She denies any nausea, vomiting, constipation, or diarrhea. She has no urinary complaints.  Patient offers no further specific complaints today.  REVIEW OF SYSTEMS:   Review of Systems  Constitutional: Negative.  Negative for fever, malaise/fatigue and weight loss.  Eyes: Negative.  Negative for blurred vision, double vision and pain.  Respiratory: Negative.  Negative for cough and shortness of breath.   Cardiovascular: Negative.  Negative for chest pain and leg swelling.  Gastrointestinal: Negative.  Negative for abdominal pain.  Genitourinary: Negative.  Negative for dysuria.  Musculoskeletal: Positive for back pain. Negative for falls.  Skin: Negative.  Negative for rash.  Neurological: Positive for tingling and sensory change. Negative for focal weakness, weakness and headaches.  Endo/Heme/Allergies: Negative.   Psychiatric/Behavioral: Negative.  Negative for  memory loss. The patient is not nervous/anxious.     As per HPI. Otherwise, a complete review of systems is negative.  PAST MEDICAL HISTORY: Past Medical History:  Diagnosis Date  . Back pain    occasionally  . Breast cancer ([place]) 2009   left  . Depression    takes Paxil and Wellbutrin daily  . Diabetes ([place])    takes Metformin daily  . Genetic testing 02/03/2017   Multi-Cancer panel (83 genes) @ [facility] - No pathogenic mutations detected  . GERD (gastroesophageal reflux disease)   . History of bronchitis 2 yrs ago  . Hyperlipidemia    takes Atorvastatin daily  . Hypertension    takes Losartan-HCTZ daily  . Insomnia    takes Ambien nightly  . Insomnia    takes gabapentin nightly  . Joint pain   . Migraine   . Mood swings   . Osteoarthritis of knee   . Personal history of chemotherapy 12/05/2016   Mets from Breast Cancer  . Personal history of radiation therapy 11/2016  . PONV (postoperative nausea and vomiting)   . Seasonal allergies    takes Allegra daily    PAST SURGICAL HISTORY: Past Surgical History:  Procedure Laterality Date  . BREAST BIOPSY  2009  . cataract surgery Bilateral   . COLONOSCOPY    . JOINT REPLACEMENT Left 2014   knee  . KNEE ARTHROSCOPY Left    x 5  . MASTECTOMY Left   . port a cath placed    . PORTACATH PLACEMENT N/A 09/17/2015   Procedure: INSERTION PORT-A-CATH;  Surgeon: Jules Husbands, MD;  Location: [facility];  Service: General;  Laterality: N/A;  . TOTAL SHOULDER ARTHROPLASTY Right 06/03/2015   Procedure: TOTAL SHOULDER ARTHROPLASTY;  Surgeon: Tania Ade, MD;  Location: [facility];  Service: Orthopedics;  Laterality: Right;  Right total shoulder arthroplasty  . TOTAL SHOULDER REPLACEMENT Right 06/03/2015  . TUBAL LIGATION      FAMILY HISTORY: Father with non-Hodgkin's lymphoma, 2 paternal aunts with breast cancer.     ADVANCED DIRECTIVES:    HEALTH MAINTENANCE: Social History   Tobacco Use  . Smoking status: Never Smoker  .  Smokeless tobacco: Never Used  Substance Use Topics  . Alcohol use: Yes    Alcohol/week: 0.0 standard drinks    Comment: occasionally wine  . Drug use: Yes    Types: Marijuana    Comment: cannabis with no extra     Colonoscopy:  PAP:  Bone density:  Lipid panel:  Allergies  Allergen Reactions  . Ace Inhibitors Cough  . Latex Itching  . Morphine And Related Itching    Caused her to itch terribly. Would prefer if given to take with a benadryl  . Other Itching    Freeze spray. Patient stated that she may be able to use it now because she doesn't use it as often.  Marland Kitchen Penicillins Rash    Has patient had a PCN reaction causing immediate rash, facial/tongue/throat swelling, SOB or lightheadedness with hypotension: No Has patient had a PCN reaction causing severe rash involving mucus membranes or skin necrosis: No Has patient had a PCN reaction that required hospitalization No Has patient had a PCN reaction occurring within the last 10 years: No If all of the above answers are "NO", then may proceed with Cephalosporin use.    Current Outpatient Medications  Medication Sig Dispense Refill  . atorvastatin (LIPITOR) 10 MG tablet Take 10 mg by mouth every evening.     Marland Kitchen buPROPion (WELLBUTRIN XL) 150 MG 24 hr tablet Take 150 mg by mouth every evening.     . Calcium-Magnesium-Vitamin D (CALCIUM 1200+D3 PO) Take 1 tablet by mouth daily.     . Cyanocobalamin 5000 MCG CAPS Take 5,000 mcg by mouth daily.     Marland Kitchen dexamethasone (DECADRON) 4 MG tablet Take 1 tablet (4 mg total) by mouth every other day. 15 tablet 3  . ferrous sulfate 325 (65 FE) MG EC tablet Take 325 mg by mouth daily.     . fexofenadine (ALLEGRA) 180 MG tablet Take 180 mg by mouth at bedtime.     . gabapentin (NEURONTIN) 300 MG capsule Take 600-900 mg by mouth See admin instructions. Take 2 capsules ('600MG'$ ) by mouth every morning, 2 capsules ('600MG'$ )by mouth  at noon and 3 capsules ('900MG'$ ) by mouth every night    . letrozole  (FEMARA) 2.5 MG tablet Take 1 tablet (2.5 mg total) by mouth daily. 90 tablet 3  . metFORMIN (GLUCOPHAGE) 850 MG tablet Take 850 mg by mouth 2 (two) times daily with a meal.     . palbociclib (IBRANCE) 75 MG capsule Take 1 capsule (75 mg total) by mouth daily with breakfast. Take for 14 days, then 14 days off. 14 capsule 5  . pantoprazole (PROTONIX) 40 MG tablet Take 40 mg by mouth every evening.     Marland Kitchen PARoxetine (PAXIL) 40 MG tablet Take 40 mg by mouth every evening.     Marland Kitchen acetaminophen (TYLENOL) 500 MG tablet Take 500 mg by mouth every 6 (six) hours as needed for mild pain or moderate pain.     Marland Kitchen lidocaine-prilocaine (EMLA) cream Apply 1 application topically as needed. Apply to port 1-2 hours prior to chemotherapy appointment. Cover  with plastic wrap. (Patient not taking: Reported on 11/21/2017) 30 g 0  . prochlorperazine (COMPAZINE) 10 MG tablet Take 1 tablet (10 mg total) by mouth every 6 (six) hours as needed for nausea or vomiting. (Patient not taking: Reported on 11/21/2017) 30 tablet 3  . Zolpidem Tartrate (AMBIEN PO) Take 10 mg by mouth at bedtime as needed.      No current facility-administered medications for this visit.    Facility-Administered Medications Ordered in Other Visits  Medication Dose Route Frequency Provider Last Rate Last Dose  . 0.9 %  sodium chloride infusion   Intravenous Continuous Lloyd Huger, MD   Stopped at 11/21/17 1201    OBJECTIVE: Vitals:   11/21/17 1007  BP: 132/71  Pulse: 97  Resp: 18  Temp: (!) 95.8 F (35.4 C)     Body mass index is 33.54 kg/m.    ECOG FS:0 - Asymptomatic  General: Well-developed, well-nourished, no acute distress. Eyes: Pink conjunctiva, anicteric sclera. HEENT: Normocephalic, moist mucous membranes. Lungs: Clear to auscultation bilaterally. Heart: Regular rate and rhythm. No rubs, murmurs, or gallops. Abdomen: Soft, nontender, nondistended. No organomegaly noted, normoactive bowel sounds. Musculoskeletal: No edema,  cyanosis, or clubbing. Neuro: Alert, answering all questions appropriately. Cranial nerves grossly intact. Skin: No rashes or petechiae noted. Psych: Normal affect.  LAB RESULTS:  Lab Results  Component Value Date   NA 134 (L) 11/21/2017   K 4.3 11/21/2017   CL 105 11/21/2017   CO2 22 11/21/2017   GLUCOSE 196 (H) 11/21/2017   BUN 20 11/21/2017   CREATININE 0.97 11/21/2017   CALCIUM 8.9 11/21/2017   PROT 5.9 (L) 11/21/2017   ALBUMIN 3.3 (L) 11/21/2017   AST 66 (H) 11/21/2017   ALT 54 (H) 11/21/2017   ALKPHOS 80 11/21/2017   BILITOT 0.3 11/21/2017   GFRNONAA 60 (L) 11/21/2017   GFRAA >60 11/21/2017    Lab Results  Component Value Date   WBC 3.8 (L) 11/21/2017   NEUTROABS 2.2 11/21/2017   HGB 9.1 (L) 11/21/2017   HCT 28.7 (L) 11/21/2017   MCV 99.3 11/21/2017   PLT 171 11/21/2017     STUDIES: Dg Chest 2 View  Result Date: 11/05/2017 CLINICAL DATA:  Shortness of breath.  History of breast carcinoma EXAM: CHEST - 2 VIEW COMPARISON:  Chest radiograph and chest CT November 03, 2017 FINDINGS: There is no edema or consolidation. There is patchy atelectatic change bilaterally. Heart size and pulmonary vascularity are normal. There is aortic atherosclerosis. Port-A-Cath tip is at the cavoatrial junction. No pneumothorax. No adenopathy. Status post total shoulder replacement on the right. No blastic or lytic bone lesions. IMPRESSION: Areas of scattered atelectatic change. No edema or consolidation. No adenopathy. Heart size normal. There is aortic atherosclerosis. Aortic Atherosclerosis (ICD10-I70.0). Electronically Signed   By: Lowella Grip III M.D.   On: 11/05/2017 11:57   Dg Chest 2 View  Result Date: 11/03/2017 CLINICAL DATA:  Shortness of breath.  History of breast cancer. EXAM: CHEST - 2 VIEW COMPARISON:  None. FINDINGS: The heart size and mediastinal contours are within normal limits. No pneumothorax or pleural effusion is noted. Right lung is clear. Minimal left  perihilar subsegmental atelectasis or inflammation is noted. Right internal jugular Port-A-Cath is in good position. Status post right shoulder arthroplasty. IMPRESSION: Minimal left perihilar subsegmental atelectasis or inflammation. Electronically Signed   By: Marijo Conception, M.D.   On: 11/03/2017 15:00   Ct Angio Chest Pe W And/or Wo Contrast  Result Date: 11/03/2017 CLINICAL  DATA:  Increasing shortness of breath. Patient with metastatic breast cancer, being treated with oral chemotherapy and radiation therapy to the spine. EXAM: CT ANGIOGRAPHY CHEST WITH CONTRAST TECHNIQUE: Multidetector CT imaging of the chest was performed using the standard protocol during bolus administration of intravenous contrast. Multiplanar CT image reconstructions and MIPs were obtained to evaluate the vascular anatomy. CONTRAST:  14m ISOVUE-370 IOPAMIDOL (ISOVUE-370) INJECTION 76% COMPARISON:  None. FINDINGS: Cardiovascular: Satisfactory opacification of the pulmonary arteries to the segmental level. No evidence of pulmonary embolism. Normal heart size. Minimal pericardial thickening versus pericardial effusion. Mild calcific atherosclerotic disease of the coronary arteries. Right internal jugular approach central venous catheter terminates at the cavoatrial junction. Mediastinum/Nodes: No enlarged mediastinal, hilar, or axillary lymph nodes. Thyroid gland, and trachea demonstrate no significant findings. Mild thickening of the distal esophagus. Small hiatal hernia. Lungs/Pleura: Diffusely scattered bilateral ground-glass opacities in the lung parenchyma with dependent predominance. Possible radiation changes in the anterior left upper lobe and lingula. Upper Abdomen: No acute abnormality. Musculoskeletal: Known widespread skeletal sclerotic metastatic disease, involving the sternum, bilateral ribs, and the spine. Possible healed pathologic fracture in the right lateral rib. Review of the MIP images confirms the above findings.  IMPRESSION: No evidence of pulmonary embolus. Diffusely scattered bilateral ground-glass opacities in the lung parenchyma with dependent predominance. This may represent multifocal pneumonia, hypersensitivity pneumonitis or developing interstitial pulmonary edema. Minimal pericardial effusion versus pericardial thickening. Known widespread skeletal metastatic disease. Electronically Signed   By: DFidela SalisburyM.D.   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 Now with biopsy proven stage IV disease with lymph node and bone metastasis.  PLAN:    1.  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  Because of this, Ibrance 75 mg for 14 days on and 14 days off was continued.  Patient's persistent leukopenia and thrombocytopenia have essentially resolved.  She continues to have a mild anemia which is trended down slightly.  Continue Ibrance as prescribed.  Proceed with Zometa today.  Return to clinic in 4 weeks for further evaluation and continuation of Zometa.  Will reimage prior to next clinic appointment. 2.  Osteopenia: Patient's bone mineral density on January 26, 2016 reported a T score of -0.9 which is considered normal. Continue calcium and vitamin D.  Zometa as above.   3.  Anemia: Patient's hemoglobin has trended down slightly and is now 9.1.  She does not require transfusion today.  4. Peripheral neuropathy: Chronic and unchanged.  Continue gabapentin as prescribed. Neuropathy managed by neurology.  5. Back pain: MRI results from May 04, 2017 did not reveal metastatic disease.   Continue symptomatic treatment.  6.  Left IJ clot: Ultrasound results reviewed independently.  Patient has been instructed to discontinue Eliquis. 7.  Leukopenia: Mild.  Continue with dose reduced Ibrance as above. 8.  Dental complaints: No intervention needed at this time.  Patient has been instructed if she changes her mind about intervention or become symptomatic we will readdress the issue at that time.  Patient expressed understanding and was in agreement with this plan. She also understands that She can call clinic at any time with any questions,  concerns, or complaints.   Breast cancer   Staging form: Breast, AJCC 7th Edition     Pathologic stage from 08/11/2014: Stage IIIA (T0, N2a, cM0) - Signed by Lloyd Huger, MD on 08/11/2014   Lloyd Huger, MD   11/21/2017 1:47 PM

## 2017-11-19 DIAGNOSIS — J189 Pneumonia, unspecified organism: Secondary | ICD-10-CM | POA: Diagnosis not present

## 2017-11-19 DIAGNOSIS — T451X5D Adverse effect of antineoplastic and immunosuppressive drugs, subsequent encounter: Secondary | ICD-10-CM | POA: Diagnosis not present

## 2017-11-19 DIAGNOSIS — G620 Drug-induced polyneuropathy: Secondary | ICD-10-CM | POA: Diagnosis not present

## 2017-11-19 DIAGNOSIS — D72819 Decreased white blood cell count, unspecified: Secondary | ICD-10-CM | POA: Diagnosis not present

## 2017-11-19 DIAGNOSIS — E119 Type 2 diabetes mellitus without complications: Secondary | ICD-10-CM | POA: Diagnosis not present

## 2017-11-19 DIAGNOSIS — C7951 Secondary malignant neoplasm of bone: Secondary | ICD-10-CM | POA: Diagnosis not present

## 2017-11-19 MED FILL — IBRANCE 75 MG CAPSULE: 75 | 28 days supply | Qty: 14 | Fill #4

## 2017-11-20 DIAGNOSIS — J189 Pneumonia, unspecified organism: Secondary | ICD-10-CM | POA: Diagnosis not present

## 2017-11-20 DIAGNOSIS — C50919 Malignant neoplasm of unspecified site of unspecified female breast: Secondary | ICD-10-CM | POA: Diagnosis not present

## 2017-11-20 DIAGNOSIS — F5104 Psychophysiologic insomnia: Secondary | ICD-10-CM | POA: Diagnosis not present

## 2017-11-21 ENCOUNTER — Other Ambulatory Visit: Payer: Self-pay

## 2017-11-21 ENCOUNTER — Inpatient Hospital Stay: Payer: Medicare Other | Attending: Oncology

## 2017-11-21 ENCOUNTER — Inpatient Hospital Stay (HOSPITAL_BASED_OUTPATIENT_CLINIC_OR_DEPARTMENT_OTHER): Payer: Medicare Other | Admitting: Oncology

## 2017-11-21 ENCOUNTER — Inpatient Hospital Stay: Payer: Medicare Other

## 2017-11-21 VITALS — BP 132/71 | HR 97 | Temp 95.8°F | Resp 18 | Wt 195.4 lb

## 2017-11-21 DIAGNOSIS — C50912 Malignant neoplasm of unspecified site of left female breast: Secondary | ICD-10-CM

## 2017-11-21 DIAGNOSIS — Z170 Estrogen receptor positive status [ER+]: Secondary | ICD-10-CM

## 2017-11-21 DIAGNOSIS — C7951 Secondary malignant neoplasm of bone: Secondary | ICD-10-CM | POA: Insufficient documentation

## 2017-11-21 DIAGNOSIS — Z95828 Presence of other vascular implants and grafts: Secondary | ICD-10-CM

## 2017-11-21 DIAGNOSIS — D649 Anemia, unspecified: Secondary | ICD-10-CM | POA: Insufficient documentation

## 2017-11-21 DIAGNOSIS — M8580 Other specified disorders of bone density and structure, unspecified site: Secondary | ICD-10-CM

## 2017-11-21 DIAGNOSIS — G620 Drug-induced polyneuropathy: Secondary | ICD-10-CM

## 2017-11-21 DIAGNOSIS — C50919 Malignant neoplasm of unspecified site of unspecified female breast: Secondary | ICD-10-CM

## 2017-11-21 DIAGNOSIS — K769 Liver disease, unspecified: Secondary | ICD-10-CM | POA: Diagnosis not present

## 2017-11-21 DIAGNOSIS — Z79899 Other long term (current) drug therapy: Secondary | ICD-10-CM | POA: Insufficient documentation

## 2017-11-21 DIAGNOSIS — C779 Secondary and unspecified malignant neoplasm of lymph node, unspecified: Secondary | ICD-10-CM

## 2017-11-21 DIAGNOSIS — M545 Low back pain: Secondary | ICD-10-CM | POA: Diagnosis not present

## 2017-11-21 DIAGNOSIS — D72819 Decreased white blood cell count, unspecified: Secondary | ICD-10-CM | POA: Insufficient documentation

## 2017-11-21 LAB — COMPREHENSIVE METABOLIC PANEL
ALBUMIN: 3.3 g/dL — AB (ref 3.5–5.0)
ALT: 54 U/L — ABNORMAL HIGH (ref 0–44)
AST: 66 U/L — ABNORMAL HIGH (ref 15–41)
Alkaline Phosphatase: 80 U/L (ref 38–126)
Anion gap: 7 (ref 5–15)
BILIRUBIN TOTAL: 0.3 mg/dL (ref 0.3–1.2)
BUN: 20 mg/dL (ref 8–23)
CHLORIDE: 105 mmol/L (ref 98–111)
CO2: 22 mmol/L (ref 22–32)
Calcium: 8.9 mg/dL (ref 8.9–10.3)
Creatinine, Ser: 0.97 mg/dL (ref 0.44–1.00)
GFR calc Af Amer: >60 mL/min (ref >60)
GFR calc non Af Amer: 60 mL/min — ABNORMAL LOW (ref >60)
GLUCOSE: 196 mg/dL — AB (ref 70–99)
POTASSIUM: 4.3 mmol/L (ref 3.5–5.1)
SODIUM: 134 mmol/L — AB (ref 135–145)
TOTAL PROTEIN: 5.9 g/dL — AB (ref 6.5–8.1)

## 2017-11-21 LAB — CBC WITH DIFFERENTIAL/PLATELET
ABS IMMATURE GRANULOCYTES: 0.36 10*3/uL — AB (ref 0.00–0.07)
BASOS PCT: 1 %
Basophils Absolute: 0 10*3/uL (ref 0.0–0.1)
Eosinophils Absolute: 0 10*3/uL (ref 0.0–0.5)
Eosinophils Relative: 0 %
HCT: 28.7 % — ABNORMAL LOW (ref 36.0–46.0)
Hemoglobin: 9.1 g/dL — ABNORMAL LOW (ref 12.0–15.0)
IMMATURE GRANULOCYTES: 10 %
LYMPHS PCT: 13 %
Lymphs Abs: 0.5 10*3/uL — ABNORMAL LOW (ref 0.7–4.0)
MCH: 31.5 pg (ref 26.0–34.0)
MCHC: 31.7 g/dL (ref 30.0–36.0)
MCV: 99.3 fL (ref 80.0–100.0)
MONO ABS: 0.6 10*3/uL (ref 0.1–1.0)
MONOS PCT: 17 %
NEUTROS ABS: 2.2 10*3/uL (ref 1.7–7.7)
NEUTROS PCT: 59 %
PLATELETS: 171 10*3/uL (ref 150–400)
RBC: 2.89 MIL/uL — ABNORMAL LOW (ref 3.87–5.11)
RDW: 16.2 % — AB (ref 11.5–15.5)
WBC: 3.8 10*3/uL — ABNORMAL LOW (ref 4.0–10.5)
nRBC: 1.6 % — ABNORMAL HIGH (ref 0.0–0.2)

## 2017-11-21 MED ORDER — HEPARIN SOD (PORK) LOCK FLUSH 100 UNIT/ML IV SOLN
500.0000 [IU] | Freq: Once | INTRAVENOUS | Status: AC
  Administered 2017-11-21: 500 [IU] via INTRAVENOUS
  Filled 2017-11-21: qty 5

## 2017-11-21 MED ORDER — SODIUM CHLORIDE 0.9 % IV SOLN
INTRAVENOUS | Status: DC
  Administered 2017-11-21: 12:00:00 via INTRAVENOUS
  Filled 2017-11-21: qty 250

## 2017-11-21 MED ORDER — HEPARIN SOD (PORK) LOCK FLUSH 100 UNIT/ML IV SOLN
INTRAVENOUS | Status: AC
  Filled 2017-11-21: qty 5

## 2017-11-21 MED ORDER — ZOLEDRONIC ACID 4 MG/100ML IV SOLN
4.0000 mg | Freq: Once | INTRAVENOUS | Status: AC
  Administered 2017-11-21: 4 mg via INTRAVENOUS
  Filled 2017-11-21: qty 100

## 2017-11-21 MED ORDER — SODIUM CHLORIDE 0.9% FLUSH
10.0000 mL | Freq: Once | INTRAVENOUS | Status: AC
  Administered 2017-11-21: 10 mL via INTRAVENOUS
  Filled 2017-11-21: qty 10

## 2017-11-21 NOTE — Progress Notes (Signed)
Here for follow up. Per pt was in /pt w pneumonia  dx 10/19-22  Feeling better now. No weakness or sob.

## 2017-11-22 ENCOUNTER — Telehealth: Payer: Self-pay | Admitting: *Deleted

## 2017-11-22 DIAGNOSIS — J189 Pneumonia, unspecified organism: Secondary | ICD-10-CM | POA: Diagnosis not present

## 2017-11-22 DIAGNOSIS — G620 Drug-induced polyneuropathy: Secondary | ICD-10-CM | POA: Diagnosis not present

## 2017-11-22 DIAGNOSIS — T451X5D Adverse effect of antineoplastic and immunosuppressive drugs, subsequent encounter: Secondary | ICD-10-CM | POA: Diagnosis not present

## 2017-11-22 DIAGNOSIS — C7951 Secondary malignant neoplasm of bone: Secondary | ICD-10-CM | POA: Diagnosis not present

## 2017-11-22 DIAGNOSIS — D72819 Decreased white blood cell count, unspecified: Secondary | ICD-10-CM | POA: Diagnosis not present

## 2017-11-22 DIAGNOSIS — E119 Type 2 diabetes mellitus without complications: Secondary | ICD-10-CM | POA: Diagnosis not present

## 2017-11-22 NOTE — Telephone Encounter (Signed)
Pt notified that she can discontinue decadron at this time. Ok per Dr. Grayland Ormond to proceed with tooth extraction on 11/18, lab only visit added for 11/15. Pt verbalized understanding and agreement with plan.

## 2017-11-22 NOTE — Telephone Encounter (Signed)
Pt called in to for advice regarding decadron dosing and tooth extraction.  Pt states her decadron was to be switched from every other day to weekly so that she may taper off over the next month. Please advise.  Pt also reports she has dentist appt scheduled on 11/18 for possible tooth extraction. She would like you recommendations regarding extraction and what time frame is best since she in on Ibrance at this time. Pt states she should start her Ibrance back today.

## 2017-11-28 ENCOUNTER — Inpatient Hospital Stay: Payer: Medicare Other

## 2017-11-28 DIAGNOSIS — D72819 Decreased white blood cell count, unspecified: Secondary | ICD-10-CM | POA: Diagnosis not present

## 2017-11-28 DIAGNOSIS — C50919 Malignant neoplasm of unspecified site of unspecified female breast: Secondary | ICD-10-CM

## 2017-11-28 DIAGNOSIS — Z170 Estrogen receptor positive status [ER+]: Secondary | ICD-10-CM | POA: Diagnosis not present

## 2017-11-28 DIAGNOSIS — D649 Anemia, unspecified: Secondary | ICD-10-CM | POA: Diagnosis not present

## 2017-11-28 DIAGNOSIS — K769 Liver disease, unspecified: Secondary | ICD-10-CM | POA: Diagnosis not present

## 2017-11-28 DIAGNOSIS — C50912 Malignant neoplasm of unspecified site of left female breast: Secondary | ICD-10-CM | POA: Diagnosis not present

## 2017-11-28 DIAGNOSIS — C7951 Secondary malignant neoplasm of bone: Secondary | ICD-10-CM | POA: Diagnosis not present

## 2017-11-28 LAB — CBC WITH DIFFERENTIAL/PLATELET
ABS IMMATURE GRANULOCYTES: 0.06 10*3/uL (ref 0.00–0.07)
Basophils Absolute: 0 10*3/uL (ref 0.0–0.1)
Basophils Relative: 1 %
Eosinophils Absolute: 0.1 10*3/uL (ref 0.0–0.5)
Eosinophils Relative: 2 %
HEMATOCRIT: 32.3 % — AB (ref 36.0–46.0)
HEMOGLOBIN: 9.9 g/dL — AB (ref 12.0–15.0)
Immature Granulocytes: 2 %
LYMPHS ABS: 0.4 10*3/uL — AB (ref 0.7–4.0)
Lymphocytes Relative: 11 %
MCH: 30.8 pg (ref 26.0–34.0)
MCHC: 30.7 g/dL (ref 30.0–36.0)
MCV: 100.6 fL — AB (ref 80.0–100.0)
Monocytes Absolute: 0.3 10*3/uL (ref 0.1–1.0)
Monocytes Relative: 9 %
NEUTROS ABS: 2.7 10*3/uL (ref 1.7–7.7)
Neutrophils Relative %: 75 %
Platelets: 251 10*3/uL (ref 150–400)
RBC: 3.21 MIL/uL — ABNORMAL LOW (ref 3.87–5.11)
RDW: 17 % — ABNORMAL HIGH (ref 11.5–15.5)
WBC: 3.6 10*3/uL — ABNORMAL LOW (ref 4.0–10.5)
nRBC: 0.8 % — ABNORMAL HIGH (ref 0.0–0.2)

## 2017-11-28 LAB — COMPREHENSIVE METABOLIC PANEL
ALT: 62 U/L — ABNORMAL HIGH (ref 0–44)
ANION GAP: 10 (ref 5–15)
AST: 99 U/L — ABNORMAL HIGH (ref 15–41)
Albumin: 3.8 g/dL (ref 3.5–5.0)
Alkaline Phosphatase: 84 U/L (ref 38–126)
BUN: 31 mg/dL — ABNORMAL HIGH (ref 8–23)
CHLORIDE: 103 mmol/L (ref 98–111)
CO2: 23 mmol/L (ref 22–32)
Calcium: 9.8 mg/dL (ref 8.9–10.3)
Creatinine, Ser: 1.5 mg/dL — ABNORMAL HIGH (ref 0.44–1.00)
GFR, EST AFRICAN AMERICAN: 41 mL/min — AB (ref >60)
GFR, EST NON AFRICAN AMERICAN: 35 mL/min — AB (ref >60)
Glucose, Bld: 143 mg/dL — ABNORMAL HIGH (ref 70–99)
POTASSIUM: 4.3 mmol/L (ref 3.5–5.1)
SODIUM: 136 mmol/L (ref 135–145)
Total Bilirubin: 0.3 mg/dL (ref 0.3–1.2)
Total Protein: 6.6 g/dL (ref 6.5–8.1)

## 2017-11-29 DIAGNOSIS — E119 Type 2 diabetes mellitus without complications: Secondary | ICD-10-CM | POA: Diagnosis not present

## 2017-11-30 ENCOUNTER — Inpatient Hospital Stay: Payer: Medicare Other

## 2017-12-03 ENCOUNTER — Other Ambulatory Visit: Payer: Self-pay | Admitting: *Deleted

## 2017-12-03 ENCOUNTER — Telehealth: Payer: Self-pay | Admitting: *Deleted

## 2017-12-03 DIAGNOSIS — R413 Other amnesia: Secondary | ICD-10-CM

## 2017-12-03 NOTE — Progress Notes (Signed)
mri

## 2017-12-03 NOTE — Telephone Encounter (Signed)
Pt called to report memory loss, asking if her medications may be causing this. Pts concerns discussed with Dr. Grayland Ormond, he recommends MRI of the brain. Per Dr. Grayland Ormond he does not think its being caused by medications. Left vm for pt regarding recommendations.

## 2017-12-06 ENCOUNTER — Other Ambulatory Visit: Payer: Medicare Other

## 2017-12-06 ENCOUNTER — Ambulatory Visit: Payer: Medicare Other

## 2017-12-10 ENCOUNTER — Ambulatory Visit
Admission: RE | Admit: 2017-12-10 | Discharge: 2017-12-10 | Disposition: A | Discharge status: Home / Self Care | Payer: Medicare Other | Source: Ambulatory Visit | Attending: Radiation Oncology | Admitting: Radiation Oncology

## 2017-12-10 ENCOUNTER — Encounter: Payer: Self-pay | Admitting: Radiation Oncology

## 2017-12-10 ENCOUNTER — Other Ambulatory Visit: Payer: Self-pay

## 2017-12-10 DIAGNOSIS — C50919 Malignant neoplasm of unspecified site of unspecified female breast: Secondary | ICD-10-CM | POA: Insufficient documentation

## 2017-12-10 DIAGNOSIS — C7951 Secondary malignant neoplasm of bone: Secondary | ICD-10-CM

## 2017-12-10 NOTE — Progress Notes (Signed)
Radiation Oncology Follow up Note  Name: Amanda [name]   Date:   12/10/2017 MRN:  [id number] DOB: [birth year]    This 67 y.o. female presents to the clinic today for one-month follow-up status postspinal radiation for palliation in patient with stage IV metastatic breast cancer.  REFERRING PROVIDER: Sofie Hartigan, MD  HPI: patient is a 67 year old female well-known to department having received multiple courses of palliative radiation therapy to her thoracic lumbar spine and sacrum for stage IV metastatic breast cancer. She is currently on Ibranceand Zometa. There was one spot in her mid back that did not receive palliative radiation therapy and we treated that area based on significant pain. She's now seen out 1 month she is pain-free. She specifically denies any dysphagia cough or any focal neurologic deficits..  COMPLICATIONS OF TREATMENT: none  FOLLOW UP COMPLIANCE: keeps appointments   PHYSICAL EXAM:  BP (P) 137/89 (BP Location: Right Arm, Patient Position: Sitting)   Pulse (P) 98   Temp (!) (P) 96.8 F (36 C) (Tympanic)   Resp (P) 18   Wt (P) 197 lb 15.6 oz (89.8 kg)   BMI (P) 33.98 kg/m  Well-developed well-nourished patient in NAD. HEENT reveals PERLA, EOMI, discs not visualized.  Oral cavity is clear. No oral mucosal lesions are identified. Neck is clear without evidence of cervical or supraclavicular adenopathy. Lungs are clear to A&P. Cardiac examination is essentially unremarkable with regular rate and rhythm without murmur rub or thrill. Abdomen is benign with no organomegaly or masses noted. Motor sensory and DTR levels are equal and symmetric in the upper and lower extremities. Cranial nerves II through XII are grossly intact. Proprioception is intact. No peripheral adenopathy or edema is identified. No motor or sensory levels are noted. Crude visual fields are within normal range.  RADIOLOGY RESULTS: no current films for review  PLAN: present time she is achieved  excellent palliative benefit from her radiation therapy. I'm turning follow-up care over to medical oncology. I would be happy to reevaluate the patient any time should further treatment be indicated.  I would like to take this opportunity to thank you for allowing me to participate in the care of your patient.Noreene Filbert, MD

## 2017-12-16 NOTE — Progress Notes (Signed)
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 Now with biopsy-proven stage IV disease.  INTERVAL HISTORY: Patient returns to clinic today for further evaluation, discussion of her imaging results, and continuation of Ibrance and Zometa.  She continues to complain of difficulty with memory, but otherwise feels well.  She has chronic weakness and fatigue. She continues to have a chronic peripheral neuropathy, but no other neurologic complaints.  She denies any recent fevers or illnesses.  She denies any chest pain, cough, hemoptysis, or shortness of breath.  Her appetite has significantly improved.  She denies any nausea, vomiting, constipation, or diarrhea. She has no urinary complaints.  Patient offers no further specific complaints today.  REVIEW OF SYSTEMS:   Review of Systems  Constitutional: Positive for malaise/fatigue. Negative for fever and weight loss.  Eyes: Negative.  Negative for blurred vision, double vision and pain.  Respiratory: Negative.  Negative for cough and shortness of breath.   Cardiovascular: Negative.  Negative for chest pain and leg swelling.  Gastrointestinal: Negative.  Negative for abdominal pain.  Genitourinary: Negative.  Negative for dysuria.  Musculoskeletal: Positive for back pain. Negative for falls.  Skin: Negative.  Negative for rash.  Neurological: Positive for tingling, sensory change and weakness. Negative for focal weakness and headaches.  Endo/Heme/Allergies: Negative.   Psychiatric/Behavioral: Positive for memory loss. The patient is not nervous/anxious.     As per HPI. Otherwise, a complete review  of systems is negative.  PAST MEDICAL HISTORY: Past Medical History:  Diagnosis Date  . Back pain    occasionally  . Breast cancer ([place]) 2009   left  . Depression    takes Paxil and Wellbutrin daily  . Diabetes ([place])    takes Metformin daily  . Genetic testing 02/03/2017   Multi-Cancer panel (83 genes) @ [facility] - No pathogenic mutations detected  . GERD (gastroesophageal reflux disease)   . History of bronchitis 2 yrs ago  . Hyperlipidemia    takes Atorvastatin daily  . Hypertension    takes Losartan-HCTZ daily  . Insomnia    takes Ambien nightly  . Insomnia    takes gabapentin nightly  . Joint pain   . Migraine   . Mood swings   . Osteoarthritis of knee   . Personal history of chemotherapy 12/05/2016   Mets from Breast Cancer  . Personal history of radiation therapy 11/2016  . PONV (postoperative nausea and vomiting)   . Seasonal allergies    takes Allegra daily    PAST SURGICAL HISTORY: Past Surgical History:  Procedure Laterality Date  . BREAST BIOPSY  2009  . cataract surgery Bilateral   . COLONOSCOPY    . JOINT REPLACEMENT Left 2014   knee  . KNEE ARTHROSCOPY Left    x 5  . MASTECTOMY Left   . port a cath placed    . PORTACATH PLACEMENT N/A 09/17/2015   Procedure: INSERTION PORT-A-CATH;  Surgeon: Jules Husbands, MD;  Location: [facility];  Service: General;  Laterality: N/A;  . TOTAL SHOULDER ARTHROPLASTY Right 06/03/2015   Procedure: TOTAL SHOULDER ARTHROPLASTY;  Surgeon: Tania Ade, MD;  Location: [facility];  Service: Orthopedics;  Laterality: Right;  Right total shoulder arthroplasty  .  TOTAL SHOULDER REPLACEMENT Right 06/03/2015  . TUBAL LIGATION      FAMILY HISTORY: Father with non-Hodgkin's lymphoma, 2 paternal aunts with breast cancer.     ADVANCED DIRECTIVES:    HEALTH MAINTENANCE: Social History   Tobacco Use  . Smoking status: Never Smoker  . Smokeless tobacco: Never Used  Substance Use Topics  . Alcohol use: Yes    Alcohol/week: 0.0  standard drinks    Comment: occasionally wine  . Drug use: Yes    Types: Marijuana    Comment: cannabis with no extra     Colonoscopy:  PAP:  Bone density:  Lipid panel:  Allergies  Allergen Reactions  . Ace Inhibitors Cough  . Latex Itching  . Morphine And Related Itching    Caused her to itch terribly. Would prefer if given to take with a benadryl  . Other Itching    Freeze spray. Patient stated that she may be able to use it now because she doesn't use it as often.  Marland Kitchen Penicillins Rash    Has patient had a PCN reaction causing immediate rash, facial/tongue/throat swelling, SOB or lightheadedness with hypotension: No Has patient had a PCN reaction causing severe rash involving mucus membranes or skin necrosis: No Has patient had a PCN reaction that required hospitalization No Has patient had a PCN reaction occurring within the last 10 years: No If all of the above answers are "NO", then may proceed with Cephalosporin use.    Current Outpatient Medications  Medication Sig Dispense Refill  . acetaminophen (TYLENOL) 500 MG tablet Take 500 mg by mouth every 6 (six) hours as needed for mild pain or moderate pain.     Marland Kitchen atorvastatin (LIPITOR) 10 MG tablet Take 10 mg by mouth every evening.     Marland Kitchen buPROPion (WELLBUTRIN XL) 150 MG 24 hr tablet Take 150 mg by mouth every evening.     . Calcium-Magnesium-Vitamin D (CALCIUM 1200+D3 PO) Take 1 tablet by mouth daily.     . Cyanocobalamin 5000 MCG CAPS Take 5,000 mcg by mouth daily.     Marland Kitchen dexamethasone (DECADRON) 4 MG tablet Take 1 tablet (4 mg total) by mouth every other day. 15 tablet 3  . ferrous sulfate 325 (65 FE) MG EC tablet Take 325 mg by mouth daily.     . fexofenadine (ALLEGRA) 180 MG tablet Take 180 mg by mouth at bedtime.     . gabapentin (NEURONTIN) 300 MG capsule Take 600-900 mg by mouth See admin instructions. Take 2 capsules (600MG) by mouth every morning, 2 capsules (600MG)by mouth  at noon and 3 capsules (900MG) by mouth  every night    . HYDROcodone-acetaminophen (NORCO/VICODIN) 5-325 MG tablet TAKE 1 TABLET BY MOUTH EVERY 4 TO 6 HOURS AS NEEDED FOR PAIN  0  . letrozole (FEMARA) 2.5 MG tablet Take 1 tablet (2.5 mg total) by mouth daily. 90 tablet 3  . lidocaine-prilocaine (EMLA) cream Apply 1 application topically as needed. Apply to port 1-2 hours prior to chemotherapy appointment. Cover with plastic wrap. 30 g 0  . metFORMIN (GLUCOPHAGE) 850 MG tablet Take 850 mg by mouth 2 (two) times daily with a meal.     . palbociclib (IBRANCE) 75 MG capsule Take 1 capsule (75 mg total) by mouth daily with breakfast. Take for 14 days, then 14 days off. 14 capsule 5  . pantoprazole (PROTONIX) 40 MG tablet Take 40 mg by mouth every evening.     Marland Kitchen PARoxetine (PAXIL) 40 MG tablet Take 40  mg by mouth every evening.     . prochlorperazine (COMPAZINE) 10 MG tablet Take 1 tablet (10 mg total) by mouth every 6 (six) hours as needed for nausea or vomiting. 30 tablet 3  . Zolpidem Tartrate (AMBIEN PO) Take 10 mg by mouth at bedtime as needed.      No current facility-administered medications for this visit.     OBJECTIVE: Vitals:   12/19/17 0912  BP: 131/84  Pulse: 92  Temp: 98.6 F (37 C)     Body mass index is 34.14 kg/m.    ECOG FS:0 - Asymptomatic  General: Well-developed, well-nourished, no acute distress. Eyes: Pink conjunctiva, anicteric sclera. HEENT: Normocephalic, moist mucous membranes. Breast: Exam deferred today. Lungs: Clear to auscultation bilaterally. Heart: Regular rate and rhythm. No rubs, murmurs, or gallops. Abdomen: Soft, nontender, nondistended. No organomegaly noted, normoactive bowel sounds. Musculoskeletal: No edema, cyanosis, or clubbing. Neuro: Alert, answering all questions appropriately. Cranial nerves grossly intact. Skin: No rashes or petechiae noted. Psych: Normal affect.  LAB RESULTS:  Lab Results  Component Value Date   NA 140 12/19/2017   K 4.1 12/19/2017   CL 109 12/19/2017    CO2 24 12/19/2017   GLUCOSE 235 (H) 12/19/2017   BUN 14 12/19/2017   CREATININE 0.95 12/19/2017   CALCIUM 8.4 (L) 12/19/2017   PROT 5.8 (L) 12/19/2017   ALBUMIN 3.2 (L) 12/19/2017   AST 57 (H) 12/19/2017   ALT 23 12/19/2017   ALKPHOS 73 12/19/2017   BILITOT 0.5 12/19/2017   GFRNONAA >60 12/19/2017   GFRAA >60 12/19/2017    Lab Results  Component Value Date   WBC 2.7 (L) 12/19/2017   NEUTROABS 1.1 (L) 12/19/2017   HGB 9.1 (L) 12/19/2017   HCT 29.1 (L) 12/19/2017   MCV 102.1 (H) 12/19/2017   PLT 220 12/19/2017     STUDIES: Nm Bone Scan Whole Body  Result Date: 12/19/2017 CLINICAL DATA:  Breast carcinoma with skeletal metastasis. EXAM: NUCLEAR MEDICINE WHOLE BODY BONE SCAN TECHNIQUE: Whole body anterior and posterior images were obtained approximately 3 hours after intravenous injection of radiopharmaceutical. RADIOPHARMACEUTICALS:  20.5 mCi Technetium-26mMDP IV COMPARISON:  09/19/2017 FINDINGS: Widespread skeletal metastasis involving the axillary and appendicular skeleton. No new foci metastatic deposits are identified. Intensity of the lesions similar. Extensive involvement of the thoracic spine and bilateral ribs. Extensive involvement of the LEFT and RIGHT shoulder girdles and proximal humeri. Extensive involvement of the pelvis and proximal femurs. Several calvarial lesions also noted. IMPRESSION: Widespread osseous metastasis not changed from comparison exam. Electronically Signed   By: SSuzy BouchardM.D.   On: 12/19/2017 09:30   Ct Abdomen Pelvis W Contrast  Result Date: 12/18/2017 CLINICAL DATA:  Left breast cancer diagnosed in 2009. Mastectomy. Osseous metastatic disease. EXAM: CT ABDOMEN AND PELVIS WITH CONTRAST TECHNIQUE: Multidetector CT imaging of the abdomen and pelvis was performed using the standard protocol following bolus administration of intravenous contrast. CONTRAST:  740mOMNIPAQUE IOHEXOL 300 MG/ML  SOLN COMPARISON:  09/24/2017 FINDINGS: Lower chest: Tiny  left-sided pleural effusion is increased. Clear lung bases. Borderline cardiomegaly with LAD coronary artery calcification. Incompletely imaged left chest wall fluid collection is diminutive and similar at 9.1 x 1.8 cm. Hepatobiliary: Suspect mild hepatic steatosis. This makes evaluation for focal liver lesions difficult. A pericholecystic right liver lobe hypoattenuating 2.0 cm lesion on image 33/2 measures 1.5 cm on the prior exam. 1.8 cm craniocaudal today versus 1.3 cm on the prior. Smaller bilateral liver lesions are likely cysts. Normal gallbladder, without  biliary ductal dilatation. Pancreas: Normal, without mass or ductal dilatation. Spleen: Normal in size. Hyperenhancing lesion within is not apparent. Adrenals/Urinary Tract: Normal adrenal glands. Mild renal cortical thinning bilaterally. Lower pole right renal collecting system calculus. Left renal sinus cysts. Normal urinary bladder. Stomach/Bowel: Normal stomach, without wall thickening. Scattered colonic diverticula. Normal terminal ileum and appendix. Normal small bowel. Vascular/Lymphatic: Aortic and branch vessel atherosclerosis. No abdominopelvic adenopathy. Reproductive: Normal uterus and adnexa. Other: No significant free fluid. No evidence of omental or peritoneal disease. Musculoskeletal: Multifocal sclerotic osseous metastasis are again identified. These are similar in size and distribution. Mild convex left lumbar spine curvature. IMPRESSION: 1. Similar osseous metastasis. 2. Suspect mild enlargement of a right liver lobe lesion which remains suspicious for metastasis. 3. Tiny left chest wall fluid collection, similar. 4. Small left pleural effusion, increased. 5. Coronary artery atherosclerosis. Aortic Atherosclerosis (ICD10-I70.0). 6. Right nephrolithiasis. Electronically Signed   By: Abigail Miyamoto M.D.   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  Now with biopsy proven stage IV disease with lymph node and bone metastasis.  PLAN:    1.  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  Nuclear med bone scan on the same day was essentially unchanged.  With only minimal progression, will continue Ibrance 75 mg for 14 days on and 14 days off.  We will further discuss with interventional radiology if local treatment to her liver lesion is possible.  Continue Ibrance as prescribed.  Proceed with Zometa today.  Return to clinic in 5 weeks after the holidays for further evaluation and continuation of Zometa.   2.  Osteopenia: Patient's bone mineral density on January 26, 2016 reported a T score of -0.9 which is considered normal. Continue calcium and vitamin D.  Zometa as above.   3.  Anemia: Patient's hemoglobin is decreased, but essentially stable at 9.1.  Continue to monitor. She does not require transfusion today.  4. Peripheral neuropathy: Chronic and unchanged.  Continue gabapentin as prescribed. Neuropathy managed by neurology.  5. Back pain: MRI results from May 04, 2017 did not reveal metastatic disease.  Continue symptomatic treatment.  6.  Left IJ clot: Ultrasound results reviewed independently.  Patient has been instructed to discontinue Eliquis. 7.  Leukopenia: Mild.  Continue with dose reduced Ibrance as above. 8.  Dental complaints: No intervention needed at this time.  Patient has been instructed if she changes her mind about intervention or become symptomatic we will readdress the issue at that time. 9.  Memory complaints: Patient has an MRI of her brain scheduled for December 21, 2017 to further evaluate.  Patient expressed understanding and was in agreement with this plan. She also understands that She can call  clinic at any time with any questions, concerns, or complaints.   Breast cancer   Staging form: Breast, AJCC 7th Edition     Pathologic stage from 08/11/2014: Stage IIIA (T0, N2a, cM0) - Signed by Lloyd Huger, MD on 08/11/2014   Lloyd Huger, MD   12/21/2017 1:59 PM

## 2017-12-17 MED FILL — IBRANCE 75 MG CAPSULE: 75 | 28 days supply | Qty: 14 | Fill #5

## 2017-12-18 ENCOUNTER — Ambulatory Visit
Admission: RE | Admit: 2017-12-18 | Discharge: 2017-12-18 | Disposition: A | Discharge status: Home / Self Care | Payer: Medicare Other | Source: Ambulatory Visit | Attending: Oncology | Admitting: Oncology

## 2017-12-18 ENCOUNTER — Encounter
Admission: RE | Admit: 2017-12-18 | Discharge: 2017-12-18 | Disposition: A | Discharge status: Home / Self Care | Payer: Medicare Other | Source: Ambulatory Visit | Attending: Oncology | Admitting: Oncology

## 2017-12-18 DIAGNOSIS — C50919 Malignant neoplasm of unspecified site of unspecified female breast: Secondary | ICD-10-CM

## 2017-12-18 DIAGNOSIS — C7951 Secondary malignant neoplasm of bone: Secondary | ICD-10-CM | POA: Diagnosis not present

## 2017-12-18 MED ORDER — IOHEXOL 300 MG/ML  SOLN
75.0000 mL | Freq: Once | INTRAMUSCULAR | Status: AC | PRN
  Administered 2017-12-18: 75 mL via INTRAVENOUS

## 2017-12-18 MED ORDER — TECHNETIUM TC 99M MEDRONATE IV KIT
20.0000 | PACK | Freq: Once | INTRAVENOUS | Status: AC | PRN
  Administered 2017-12-18: 22.52 via INTRAVENOUS

## 2017-12-19 ENCOUNTER — Other Ambulatory Visit: Payer: Self-pay

## 2017-12-19 ENCOUNTER — Inpatient Hospital Stay (HOSPITAL_BASED_OUTPATIENT_CLINIC_OR_DEPARTMENT_OTHER): Payer: Medicare Other | Admitting: Oncology

## 2017-12-19 ENCOUNTER — Inpatient Hospital Stay: Payer: Medicare Other

## 2017-12-19 ENCOUNTER — Inpatient Hospital Stay: Payer: Medicare Other | Attending: Oncology

## 2017-12-19 VITALS — BP 131/84 | HR 92 | Temp 98.6°F | Wt 198.9 lb

## 2017-12-19 DIAGNOSIS — D649 Anemia, unspecified: Secondary | ICD-10-CM

## 2017-12-19 DIAGNOSIS — R5382 Chronic fatigue, unspecified: Secondary | ICD-10-CM | POA: Diagnosis not present

## 2017-12-19 DIAGNOSIS — M8580 Other specified disorders of bone density and structure, unspecified site: Secondary | ICD-10-CM | POA: Diagnosis not present

## 2017-12-19 DIAGNOSIS — K769 Liver disease, unspecified: Secondary | ICD-10-CM | POA: Diagnosis not present

## 2017-12-19 DIAGNOSIS — C50912 Malignant neoplasm of unspecified site of left female breast: Secondary | ICD-10-CM

## 2017-12-19 DIAGNOSIS — C50919 Malignant neoplasm of unspecified site of unspecified female breast: Secondary | ICD-10-CM

## 2017-12-19 DIAGNOSIS — C7951 Secondary malignant neoplasm of bone: Secondary | ICD-10-CM

## 2017-12-19 DIAGNOSIS — D72819 Decreased white blood cell count, unspecified: Secondary | ICD-10-CM | POA: Diagnosis not present

## 2017-12-19 DIAGNOSIS — C779 Secondary and unspecified malignant neoplasm of lymph node, unspecified: Secondary | ICD-10-CM | POA: Diagnosis not present

## 2017-12-19 DIAGNOSIS — M545 Low back pain: Secondary | ICD-10-CM

## 2017-12-19 DIAGNOSIS — Z170 Estrogen receptor positive status [ER+]: Secondary | ICD-10-CM | POA: Insufficient documentation

## 2017-12-19 DIAGNOSIS — G620 Drug-induced polyneuropathy: Secondary | ICD-10-CM | POA: Diagnosis not present

## 2017-12-19 DIAGNOSIS — I82C12 Acute embolism and thrombosis of left internal jugular vein: Secondary | ICD-10-CM | POA: Diagnosis not present

## 2017-12-19 DIAGNOSIS — R531 Weakness: Secondary | ICD-10-CM

## 2017-12-19 LAB — SAMPLE TO BLOOD BANK

## 2017-12-19 LAB — COMPREHENSIVE METABOLIC PANEL
ALT: 23 U/L (ref 0–44)
AST: 57 U/L — ABNORMAL HIGH (ref 15–41)
Albumin: 3.2 g/dL — ABNORMAL LOW (ref 3.5–5.0)
Alkaline Phosphatase: 73 U/L (ref 38–126)
Anion gap: 7 (ref 5–15)
BUN: 14 mg/dL (ref 8–23)
CALCIUM: 8.4 mg/dL — AB (ref 8.9–10.3)
CO2: 24 mmol/L (ref 22–32)
CREATININE: 0.95 mg/dL (ref 0.44–1.00)
Chloride: 109 mmol/L (ref 98–111)
GFR calc Af Amer: >60 mL/min (ref >60)
Glucose, Bld: 235 mg/dL — ABNORMAL HIGH (ref 70–99)
Potassium: 4.1 mmol/L (ref 3.5–5.1)
Sodium: 140 mmol/L (ref 135–145)
Total Bilirubin: 0.5 mg/dL (ref 0.3–1.2)
Total Protein: 5.8 g/dL — ABNORMAL LOW (ref 6.5–8.1)

## 2017-12-19 LAB — CBC WITH DIFFERENTIAL/PLATELET
Abs Immature Granulocytes: 0.1 10*3/uL — ABNORMAL HIGH (ref 0.00–0.07)
Basophils Absolute: 0.1 10*3/uL (ref 0.0–0.1)
Basophils Relative: 3 %
EOS ABS: 0.3 10*3/uL (ref 0.0–0.5)
Eosinophils Relative: 10 %
HCT: 29.1 % — ABNORMAL LOW (ref 36.0–46.0)
Hemoglobin: 9.1 g/dL — ABNORMAL LOW (ref 12.0–15.0)
Immature Granulocytes: 4 %
Lymphocytes Relative: 19 %
Lymphs Abs: 0.5 10*3/uL — ABNORMAL LOW (ref 0.7–4.0)
MCH: 31.9 pg (ref 26.0–34.0)
MCHC: 31.3 g/dL (ref 30.0–36.0)
MCV: 102.1 fL — ABNORMAL HIGH (ref 80.0–100.0)
MONOS PCT: 23 %
Monocytes Absolute: 0.6 10*3/uL (ref 0.1–1.0)
Neutro Abs: 1.1 10*3/uL — ABNORMAL LOW (ref 1.7–7.7)
Neutrophils Relative %: 41 %
Platelets: 220 10*3/uL (ref 150–400)
RBC: 2.85 MIL/uL — ABNORMAL LOW (ref 3.87–5.11)
RDW: 17.4 % — ABNORMAL HIGH (ref 11.5–15.5)
WBC: 2.7 10*3/uL — ABNORMAL LOW (ref 4.0–10.5)
nRBC: 1.9 % — ABNORMAL HIGH (ref 0.0–0.2)

## 2017-12-19 MED ORDER — SODIUM CHLORIDE 0.9 % IV SOLN
INTRAVENOUS | Status: DC
  Administered 2017-12-19: 10:00:00 via INTRAVENOUS
  Filled 2017-12-19: qty 250

## 2017-12-19 MED ORDER — ZOLEDRONIC ACID 4 MG/100ML IV SOLN
4.0000 mg | Freq: Once | INTRAVENOUS | Status: AC
  Administered 2017-12-19: 4 mg via INTRAVENOUS
  Filled 2017-12-19: qty 100

## 2017-12-19 MED ORDER — HEPARIN SOD (PORK) LOCK FLUSH 100 UNIT/ML IV SOLN
500.0000 [IU] | Freq: Once | INTRAVENOUS | Status: AC
  Administered 2017-12-19: 500 [IU] via INTRAVENOUS
  Filled 2017-12-19: qty 5

## 2017-12-19 NOTE — Progress Notes (Signed)
Patient is here today for her metastatic breast cancer, left and for CT Scan and bone density results. Patient stated that she is feeling better. Patient is scheduled to have her MRI on Friday 12/21/2017.

## 2017-12-21 ENCOUNTER — Ambulatory Visit
Admission: RE | Admit: 2017-12-21 | Discharge: 2017-12-21 | Disposition: A | Discharge status: Home / Self Care | Payer: Medicare Other | Source: Ambulatory Visit | Attending: Oncology | Admitting: Oncology

## 2017-12-21 ENCOUNTER — Ambulatory Visit: Payer: Medicare Other | Admitting: Occupational Therapy

## 2017-12-21 DIAGNOSIS — R413 Other amnesia: Secondary | ICD-10-CM | POA: Diagnosis not present

## 2017-12-21 DIAGNOSIS — C7951 Secondary malignant neoplasm of bone: Secondary | ICD-10-CM | POA: Insufficient documentation

## 2017-12-21 DIAGNOSIS — C50919 Malignant neoplasm of unspecified site of unspecified female breast: Secondary | ICD-10-CM | POA: Diagnosis not present

## 2017-12-21 MED ORDER — GADOBUTROL 1 MMOL/ML IV SOLN
9.0000 mL | Freq: Once | INTRAVENOUS | Status: AC | PRN
  Administered 2017-12-21: 9 mL via INTRAVENOUS

## 2017-12-25 ENCOUNTER — Ambulatory Visit: Payer: Private Health Insurance - Indemnity | Attending: Family Medicine | Admitting: Occupational Therapy

## 2017-12-25 DIAGNOSIS — I972 Postmastectomy lymphedema syndrome: Secondary | ICD-10-CM

## 2017-12-25 DIAGNOSIS — M25512 Pain in left shoulder: Secondary | ICD-10-CM

## 2017-12-25 DIAGNOSIS — M25612 Stiffness of left shoulder, not elsewhere classified: Secondary | ICD-10-CM

## 2017-12-25 NOTE — Therapy (Signed)
[facility] [address] [place], [address] Phone: [phone]   Fax:  [phone]  Occupational Therapy Screen   Patient Details  Name: Amanda [name] MRN: [id number] Date of Birth: 1950/04/03 No data recorded  Encounter Date: 12/25/2017    Past Medical History:  Diagnosis Date  . Back pain    occasionally  . Breast cancer ([place]) 2009   left  . Depression    takes Paxil and Wellbutrin daily  . Diabetes ([place])    takes Metformin daily  . Genetic testing 02/03/2017   Multi-Cancer panel (83 genes) @ [facility] - No pathogenic mutations detected  . GERD (gastroesophageal reflux disease)   . History of bronchitis 2 yrs ago  . Hyperlipidemia    takes Atorvastatin daily  . Hypertension    takes Losartan-HCTZ daily  . Insomnia    takes Ambien nightly  . Insomnia    takes gabapentin nightly  . Joint pain   . Migraine   . Mood swings   . Osteoarthritis of knee   . Personal history of chemotherapy 12/05/2016   Mets from Breast Cancer  . Personal history of radiation therapy 11/2016  . PONV (postoperative nausea and vomiting)   . Seasonal allergies    takes Allegra daily    Past Surgical History:  Procedure Laterality Date  . BREAST BIOPSY  2009  . cataract surgery Bilateral   . COLONOSCOPY    . JOINT REPLACEMENT Left 2014   knee  . KNEE ARTHROSCOPY Left    x 5  . MASTECTOMY Left   . port a cath placed    . PORTACATH PLACEMENT N/A 09/17/2015   Procedure: INSERTION PORT-A-CATH;  Surgeon: Jules Husbands, MD;  Location: [facility];  Service: General;  Laterality: N/A;  . TOTAL SHOULDER ARTHROPLASTY Right 06/03/2015   Procedure: TOTAL SHOULDER ARTHROPLASTY;  Surgeon: Tania Ade, MD;  Location: [facility];  Service: Orthopedics;  Laterality: Right;  Right total shoulder arthroplasty  . TOTAL SHOULDER REPLACEMENT Right 06/03/2015  . TUBAL LIGATION      There were no vitals filed for this  visit.  Subjective Assessment - 12/25/17 1647    Subjective   The last month my arm feels more ache and swollen - and my sleeves fit tighter around my arm - my shoulder hurting more and my motion is not as good- still doing my chemo drug and had radiation on my upper back - end in Oct     Patient Stated Goals  Patient reports she wants to control the swelling in her left arm, be able to take care of herself.     Currently in Pain?  Yes    Pain Score  5     Pain Location  Shoulder    Pain Orientation  Left    Pain Descriptors / Indicators  Aching          LYMPHEDEMA/ONCOLOGY QUESTIONNAIRE - 12/25/17 0934      Left Upper Extremity Lymphedema   15 cm Proximal to Olecranon Process  41.5 cm    10 cm Proximal to Olecranon Process  40 cm    Olecranon Process  28 cm    15 cm Proximal to Ulnar Styloid Process  27.4 cm    10 cm Proximal to Ulnar Styloid Process  24.4 cm    Just Proximal to Ulnar Styloid Process  17.7 cm    Across Hand at [employer]  20.2 cm  At [facility] of 2nd Digit  7 cm    At [facility] of Thumb  7.2 cm        Pt L UE circumfernce measurements increase since she was seen in June - she had some radiation that ended in Oct on upper back per pt - and she present for screen to this OT that know her well for R UE lymphedema.  She was fitted in June with Mliss Fritz strong - and also got Juzo gauntlet- pt report she has not been wearing them really -except if she has increase lymphedema - Also did not wear her Jovipak breast pad  Pt to wear at night time Isotoner glove with soft tubigrip hand to elbow , and tubigrip F on wrist to upper arm  And then wear her daytime off the shelf sleeve and gauntlet every day - for 3 wks    Appear her fit of over the counter sleeve and gauntlet fits well   Patient will contact me in about 3 wks for follow up appt  to check measurements and status                  OT Long Term Goals - 03/19/17 1349      OT LONG TERM GOAL  #1   Title  Patient will be independent in wear, care and schedule of compression garments.     Baseline  just found last week prosthesis and compression bra with jovipak she likes and will wear but need new compression daysleeve     Time  8    Period  Weeks    Status  On-going    Target Date  05/14/17      OT LONG TERM GOAL #2   Title  Patient will demonstrate HEP with modified independence.     Status  Achieved      OT LONG TERM GOAL #3   Title  Patient will decrease circumferential measurements by 1.5 cm from mid forearm to upper arm to fit into compression garment and for maintenace of lymphedema symptoms.     Status  Achieved              Patient will benefit from skilled therapeutic intervention in order to improve the following deficits and impairments:     Visit Diagnosis: Postmastectomy lymphedema  Left shoulder pain, unspecified chronicity  Stiffness of left shoulder, not elsewhere classified    Problem List Patient Active Problem List   Diagnosis Date Noted  . CAP (community acquired pneumonia) 11/03/2017  . Genetic testing 02/03/2017  . Contusion of knee 12/06/2016  . Dislocation of shoulder joint 12/06/2016  . Shoulder joint pain 12/06/2016  . Localized, primary osteoarthritis 12/06/2016  . Osteoarthritis of knee 12/06/2016  . Burning sensation 11/13/2016  . Numbness and tingling 11/13/2016  . Malignant neoplasm of left breast in female, estrogen receptor positive ([place]) 10/05/2016  . Carcinoma of left breast, stage 4 ([place]) 04/06/2016  . Chemotherapy-induced neuropathy ([place]) 03/07/2016  . Metastatic breast cancer ([place]) 09/24/2015  . 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 Personal history of breast cancer 08/23/2015  . 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 Degenerative arthritis of right shoulder region 01/13/2015  . Cancer of left female breast  ([place]) 07/27/2014  . 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 Rotator cuff tear 06/10/2014  . Allergic rhinitis, seasonal 09/16/2013  . Depression 08/10/2013  . Diabetes mellitus type 2, uncomplicated ([place]) 62/95/2841  .  Hyperlipidemia, unspecified 08/10/2013  . BP (high blood pressure) 08/10/2013  . 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 [place], [address] Phone: [phone]   Fax:  [phone]  Name: TRUDE CANSLER MRN: [id number] Date of Birth: 1950/01/27

## 2017-12-26 DIAGNOSIS — Z853 Personal history of malignant neoplasm of breast: Secondary | ICD-10-CM | POA: Diagnosis not present

## 2017-12-26 DIAGNOSIS — H1851 Endothelial corneal dystrophy: Secondary | ICD-10-CM | POA: Diagnosis not present

## 2017-12-26 DIAGNOSIS — M179 Osteoarthritis of knee, unspecified: Secondary | ICD-10-CM | POA: Diagnosis not present

## 2017-12-26 DIAGNOSIS — G620 Drug-induced polyneuropathy: Secondary | ICD-10-CM | POA: Diagnosis not present

## 2017-12-26 DIAGNOSIS — Z9012 Acquired absence of left breast and nipple: Secondary | ICD-10-CM | POA: Diagnosis not present

## 2017-12-26 DIAGNOSIS — E119 Type 2 diabetes mellitus without complications: Secondary | ICD-10-CM | POA: Diagnosis not present

## 2017-12-26 DIAGNOSIS — C7951 Secondary malignant neoplasm of bone: Secondary | ICD-10-CM | POA: Diagnosis not present

## 2017-12-27 ENCOUNTER — Other Ambulatory Visit: Payer: Medicare Other

## 2017-12-27 NOTE — Progress Notes (Signed)
Tumor Board Documentation  RYLIEGH MCDUFFEY was presented by Dr Grayland Ormond at our Tumor Board on 12/27/2017, which included representatives from medical oncology, radiation oncology, surgical oncology, internal medicine, navigation, pathology, radiology, surgical, research, pulmonology.  Asma currently presents as a current patient with history of the following treatments: neoadjuvant chemoradiation, palliative chemotherapy.  Additionally, we reviewed previous medical and familial history, history of present illness, and recent lab results along with all available histopathologic and imaging studies. The tumor board considered available treatment options and made the following recommendations:   Refer to IR for ablation of liver lesion  The following procedures/referrals were also placed: No orders of the defined types were placed in this encounter.   Clinical Trial Status:     Staging used: AJCC Stage Group  National site-specific guidelines NCCN were discussed with respect to the case.  Tumor board is a meeting of clinicians from various specialty areas who evaluate and discuss patients for whom a multidisciplinary approach is being considered. Final determinations in the plan of care are those of the provider(s). The responsibility for follow up of recommendations given during tumor board is that of the provider.   Today's extended care, comprehensive team conference, Nadia was not present for the discussion and was not examined.   Multidisciplinary Tumor Board is a multidisciplinary case peer review process.  Decisions discussed in the Multidisciplinary Tumor Board reflect the opinions of the specialists present at the conference without having examined the patient.  Ultimately, treatment and diagnostic decisions rest with the primary provider(s) and the patient.

## 2018-01-02 DIAGNOSIS — M25512 Pain in left shoulder: Secondary | ICD-10-CM | POA: Diagnosis not present

## 2018-01-02 DIAGNOSIS — M7582 Other shoulder lesions, left shoulder: Secondary | ICD-10-CM | POA: Diagnosis not present

## 2018-01-02 DIAGNOSIS — M25612 Stiffness of left shoulder, not elsewhere classified: Secondary | ICD-10-CM | POA: Diagnosis not present

## 2018-01-04 DIAGNOSIS — Z961 Presence of intraocular lens: Secondary | ICD-10-CM | POA: Diagnosis not present

## 2018-01-04 DIAGNOSIS — H43393 Other vitreous opacities, bilateral: Secondary | ICD-10-CM | POA: Diagnosis not present

## 2018-01-04 DIAGNOSIS — H1851 Endothelial corneal dystrophy: Secondary | ICD-10-CM | POA: Diagnosis not present

## 2018-01-10 DIAGNOSIS — M7582 Other shoulder lesions, left shoulder: Secondary | ICD-10-CM | POA: Diagnosis not present

## 2018-01-10 DIAGNOSIS — M25612 Stiffness of left shoulder, not elsewhere classified: Secondary | ICD-10-CM | POA: Diagnosis not present

## 2018-01-10 DIAGNOSIS — M25512 Pain in left shoulder: Secondary | ICD-10-CM | POA: Diagnosis not present

## 2018-01-14 ENCOUNTER — Other Ambulatory Visit: Payer: Self-pay | Admitting: Oncology

## 2018-01-14 DIAGNOSIS — C50919 Malignant neoplasm of unspecified site of unspecified female breast: Secondary | ICD-10-CM

## 2018-01-14 NOTE — Telephone Encounter (Signed)
...   Ref Range & Units 3wk ago (12/19/17) 64mo ago (11/28/17) 64mo ago (11/21/17) 38mo ago (11/06/17) 7mo ago (11/04/17) 59mo ago (11/03/17) 74mo ago (10/24/17)  WBC 4.0 - 10.5 K/uL 2.7Low   3.6Low   3.8Low   2.8Low  CM 2.9Low   3.6Low   4.0   RBC 3.87 - 5.11 MIL/uL 2.85Low   3.21Low   2.89Low   3.10Low   3.02Low   3.28Low   3.32Low    Hemoglobin 12.0 - 15.0 g/dL 9.1Low   9.9Low   9.1Low   9.8Low   9.4Low   10.2Low   10.3Low    HCT 36.0 - 46.0 % 29.1Low   32.3Low   28.7Low   30.2Low   30.0Low   32.6Low   32.9Low    MCV 80.0 - 100.0 fL 102.1High   100.6High   99.3  97.4  99.3  99.4  99.1   MCH 26.0 - 34.0 pg 31.9  30.8  31.5  31.6  31.1  31.1  31.0   MCHC 30.0 - 36.0 g/dL 31.3  30.7  31.7  32.5  31.3  31.3  31.3   RDW 11.5 - 15.5 % 17.4High   17.0High   16.2High   15.9High   16.3High   16.5High   17.3High    Platelets 150 - 400 K/uL 220  251  171  223  218  223  205   nRBC 0.0 - 0.2 % 1.9High   0.8High   1.6High   0.0 CM 0.0 CM 0.0  0.5High    Neutrophils Relative % % 41  75  59    93  73   Neutro Abs 1.7 - 7.7 K/uL 1.1Low   2.7  2.2    3.3  3.0   Lymphocytes Relative % 19  11  13    3  7    Lymphs Abs 0.7 - 4.0 K/uL 0.5Low   0.4Low   0.5Low     0.1Low   0.3Low    Monocytes Relative % 23  9  17    4  11    Monocytes Absolute 0.1 - 1.0 K/uL 0.6  0.3  0.6    0.1  0.4   Eosinophils Relative % 10  2  0    0  0   Eosinophils Absolute 0.0 - 0.5 K/uL 0.3  0.1  0.0    0.0  0.0   Basophils Relative % 3  1  1     0  1   Basophils Absolute 0.0 - 0.1 K/uL 0.1  0.0  0.0    0.0  0.0   Immature Granulocytes % 4  2  10     8    Abs Immature Granulocytes 0.00 - 0.07 K/uL 0.10High   0.06 CM 0.36High  CM    0.32High  CM  Comment: Performed at [facility], [address] [place], [place] [address]  WBC Morphology    SMEAR SCANNED VC, CM    MILD LEFT SHIFT (1-5% METAS, OCC MYELO, OCC BANDS) VC  nRBC       1High     Abnormal Lymphocytes Present       PRESENT    Schistocytes       PRESENT    Polychromasia        PRESENT CM   RBC Morphology        RARE NRBCs VC  Resulting Agency  [place] [facility] [place] [facility] [place] [facility] [place] [facility] [place] [facility] [place] [facility]      Specimen Collected: 12/19/17 08:38  Last Resulted: 12/19/17 09:03     Lab Flowsheet    Order Details    View Encounter    Lab and Collection Details    Routing    Result History      VC=Value has a corrected status CM=Additional comments      Other Results from 12/19/2017   Contains abnormal data Comprehensive metabolic panel  Order: [id number]   Status:  Final result  Visible to patient:  Yes (MyChart)  Next appt:  01/23/2018 at 09:45 AM in Oncology ([facility])  Dx:  Malignant neoplasm of female breast, ...   Ref Range & Units 3wk ago (12/19/17) 60mo ago (11/28/17) 95mo ago (11/21/17) 76mo ago (11/06/17) 54mo ago (11/04/17) 26mo ago (11/03/17) 50mo ago (10/24/17)  Sodium 135 - 145 mmol/L 140  136  134Low   139  139  133Low   138   Potassium 3.5 - 5.1 mmol/L 4.1  4.3  4.3  4.4 CM 4.0  4.1  5.1   Chloride 98 - 111 mmol/L 109  103  105  107  104  102  102   CO2 22 - 32 mmol/L 24  23  22  22  24   17Low   26   Glucose, Bld 70 - 99 mg/dL 235High   143High   196High   155High   184High   215High   145High    BUN 8 - 23 mg/dL 14  31High   20  18  23   29High   30High    Creatinine, Ser 0.44 - 1.00 mg/dL 0.95  1.50High   0.97  1.18High   1.21High   1.44High   1.17High    Calcium 8.9 - 10.3 mg/dL 8.4Low   9.8  8.9  8.0Low   8.3Low   9.4  9.6   Total Protein 6.5 - 8.1 g/dL 5.8Low   6.6  5.9Low      6.3Low    Albumin 3.5 - 5.0 g/dL 3.2Low   3.8  3.3Low      3.5   AST 15 - 41 U/L 57High   99High   66High      74High    ALT 0 - 44 U/L 23  62High   54High      69High    Alkaline Phosphatase 38 - 126 U/L 73  84  80     84   Total Bilirubin 0.3 - 1.2 mg/dL 0.5  0.3  0.3     0.5   GFR calc non Af Amer >60 mL/min >60  35Low   60Low   47Low   46Low   37Low   47Low    GFR calc Af Amer >60 mL/min >60  41Low  CM >60 CM 54Low  CM  53Low  CM 43Low  CM 55Low  CM  Anion gap 5 - 15 7  10  CM 7 CM 10 CM 11 CM 14 CM 10 CM  Comment: Performed at [facility], [address] [place], [place] [address]  Resulting Agency  [facility] [place] [facility] [place] [facility] [place] [facility] [place] [facility] [place] [facility] [place] [facility]      Specimen Collected: 12/19/17 08:38  Last Resulted: 12/19/17 09:19

## 2018-01-15 ENCOUNTER — Other Ambulatory Visit: Payer: Medicare Other

## 2018-01-15 ENCOUNTER — Ambulatory Visit: Payer: Medicare Other

## 2018-01-15 ENCOUNTER — Ambulatory Visit: Payer: Medicare Other | Admitting: Oncology

## 2018-01-15 DIAGNOSIS — M25612 Stiffness of left shoulder, not elsewhere classified: Secondary | ICD-10-CM | POA: Diagnosis not present

## 2018-01-15 DIAGNOSIS — M25512 Pain in left shoulder: Secondary | ICD-10-CM | POA: Diagnosis not present

## 2018-01-15 DIAGNOSIS — M7582 Other shoulder lesions, left shoulder: Secondary | ICD-10-CM | POA: Diagnosis not present

## 2018-01-18 DIAGNOSIS — M25512 Pain in left shoulder: Secondary | ICD-10-CM | POA: Diagnosis not present

## 2018-01-18 DIAGNOSIS — M25612 Stiffness of left shoulder, not elsewhere classified: Secondary | ICD-10-CM | POA: Diagnosis not present

## 2018-01-18 DIAGNOSIS — M7582 Other shoulder lesions, left shoulder: Secondary | ICD-10-CM | POA: Diagnosis not present

## 2018-01-18 MED FILL — IBRANCE 75 MG CAPSULE: 75 | 28 days supply | Qty: 14 | Fill #0

## 2018-01-18 NOTE — Progress Notes (Signed)
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  INTERVAL HISTORY: Patient returns to clinic today for further evaluation and continuation of Ibrance and Zometa.  She is tolerating her treatments well.  She continues to have chronic weakness and fatigue. She continues to have a chronic peripheral neuropathy, but no other neurologic complaints.  She denies any recent fevers or illnesses.  She denies any chest pain, cough, hemoptysis, or shortness of breath.  Her appetite has significantly improved.  She denies any nausea, vomiting, constipation, or diarrhea. She has no urinary complaints.  Patient offers no further specific complaints today.  REVIEW OF SYSTEMS:   Review of Systems  Constitutional: Positive for malaise/fatigue. Negative for fever and weight loss.  Eyes: Negative.  Negative for blurred vision, double vision and pain.  Respiratory: Negative.  Negative for cough and shortness of breath.   Cardiovascular: Negative.  Negative for chest pain and leg swelling.  Gastrointestinal: Negative.  Negative for abdominal pain.  Genitourinary: Negative.  Negative for dysuria.  Musculoskeletal: Positive for back pain. Negative for falls.  Skin: Negative.  Negative for rash.  Neurological: Positive for tingling, sensory change and weakness. Negative for focal weakness and headaches.  Endo/Heme/Allergies: Negative.   Psychiatric/Behavioral: Positive for memory loss. The patient is not nervous/anxious.     As per HPI. Otherwise, a complete review of systems is negative.  PAST MEDICAL HISTORY: Past Medical  History:  Diagnosis Date  . Back pain    occasionally  . Breast cancer ([place]) 2009   left  . Depression    takes Paxil and Wellbutrin daily  . Diabetes ([place])    takes Metformin daily  . Genetic testing 02/03/2017   Multi-Cancer panel (83 genes) @ [facility] - No pathogenic mutations detected  . GERD (gastroesophageal reflux disease)   . History of bronchitis 2 yrs ago  . Hyperlipidemia    takes Atorvastatin daily  . Hypertension    takes Losartan-HCTZ daily  . Insomnia    takes Ambien nightly  . Insomnia    takes gabapentin nightly  . Joint pain   . Migraine   . Mood swings   . Osteoarthritis of knee   . Personal history of chemotherapy 12/05/2016   Mets from Breast Cancer  . Personal history of radiation therapy 11/2016  . PONV (postoperative nausea and vomiting)   . Seasonal allergies    takes Allegra daily    PAST SURGICAL HISTORY: Past Surgical History:  Procedure Laterality Date  . BREAST BIOPSY  2009  . cataract surgery Bilateral   . COLONOSCOPY    . JOINT REPLACEMENT Left 2014   knee  . KNEE ARTHROSCOPY Left    x 5  . MASTECTOMY Left   . port a cath placed    . PORTACATH PLACEMENT N/A 09/17/2015   Procedure: INSERTION PORT-A-CATH;  Surgeon: Jules Husbands, MD;  Location: [facility];  Service: General;  Laterality: N/A;  . TOTAL SHOULDER ARTHROPLASTY Right 06/03/2015   Procedure: TOTAL SHOULDER ARTHROPLASTY;  Surgeon: Tania Ade, MD;  Location: [facility];  Service: Orthopedics;  Laterality: Right;  Right total shoulder arthroplasty  . TOTAL SHOULDER REPLACEMENT Right 06/03/2015  . TUBAL LIGATION  FAMILY HISTORY: Father with non-Hodgkin's lymphoma, 2 paternal aunts with breast cancer.     ADVANCED DIRECTIVES:    HEALTH MAINTENANCE: Social History   Tobacco Use  . Smoking status: Never Smoker  . Smokeless tobacco: Never Used  Substance Use Topics  . Alcohol use: Yes    Alcohol/week: 0.0 standard drinks    Comment: occasionally wine  . Drug use: Yes     Types: Marijuana    Comment: cannabis with no extra     Colonoscopy:  PAP:  Bone density:  Lipid panel:  Allergies  Allergen Reactions  . Ace Inhibitors Cough  . Latex Itching  . Morphine And Related Itching    Caused her to itch terribly. Would prefer if given to take with a benadryl  . Other Itching    Freeze spray. Patient stated that she may be able to use it now because she doesn't use it as often.  Marland Kitchen Penicillins Rash    Has patient had a PCN reaction causing immediate rash, facial/tongue/throat swelling, SOB or lightheadedness with hypotension: No Has patient had a PCN reaction causing severe rash involving mucus membranes or skin necrosis: No Has patient had a PCN reaction that required hospitalization No Has patient had a PCN reaction occurring within the last 10 years: No If all of the above answers are "NO", then may proceed with Cephalosporin use.    Current Outpatient Medications  Medication Sig Dispense Refill  . acetaminophen (TYLENOL) 500 MG tablet Take 500 mg by mouth every 6 (six) hours as needed for mild pain or moderate pain.     Marland Kitchen atorvastatin (LIPITOR) 10 MG tablet Take 10 mg by mouth every evening.     Marland Kitchen buPROPion (WELLBUTRIN XL) 150 MG 24 hr tablet Take 150 mg by mouth every evening.     . Calcium-Magnesium-Vitamin D (CALCIUM 1200+D3 PO) Take 1 tablet by mouth daily.     . Cyanocobalamin 5000 MCG CAPS Take 5,000 mcg by mouth daily.     Marland Kitchen dexamethasone (DECADRON) 4 MG tablet Take 1 tablet (4 mg total) by mouth every other day. 15 tablet 3  . ferrous sulfate 325 (65 FE) MG EC tablet Take 325 mg by mouth daily.     . fexofenadine (ALLEGRA) 180 MG tablet Take 180 mg by mouth at bedtime.     . gabapentin (NEURONTIN) 300 MG capsule Take 600-900 mg by mouth See admin instructions. Take 2 capsules (600MG) by mouth every morning, 2 capsules (600MG)by mouth  at noon and 3 capsules (900MG) by mouth every night    . HYDROcodone-acetaminophen (NORCO/VICODIN) 5-325 MG  tablet TAKE 1 TABLET BY MOUTH EVERY 4 TO 6 HOURS AS NEEDED FOR PAIN  0  . IBRANCE 75 MG capsule TAKE 1 CAPSULE (75 MG TOTAL) BY MOUTH DAILY WITH BREAKFAST. TAKE FOR 14 DAYS, THEN 14 DAYS OFF. 14 capsule 5  . letrozole (FEMARA) 2.5 MG tablet Take 1 tablet (2.5 mg total) by mouth daily. 90 tablet 3  . lidocaine-prilocaine (EMLA) cream Apply 1 application topically as needed. Apply to port 1-2 hours prior to chemotherapy appointment. Cover with plastic wrap. 30 g 0  . metFORMIN (GLUCOPHAGE) 850 MG tablet Take 850 mg by mouth 2 (two) times daily with a meal.     . pantoprazole (PROTONIX) 40 MG tablet Take 40 mg by mouth every evening.     Marland Kitchen PARoxetine (PAXIL) 40 MG tablet Take 40 mg by mouth every evening.     . prochlorperazine (COMPAZINE) 10 MG tablet  Take 1 tablet (10 mg total) by mouth every 6 (six) hours as needed for nausea or vomiting. 30 tablet 3  . Zolpidem Tartrate (AMBIEN PO) Take 10 mg by mouth at bedtime as needed.      No current facility-administered medications for this visit.     OBJECTIVE: Vitals:   01/23/18 0958  BP: (!) 146/90  Pulse: 64  Temp: 98.6 F (37 C)     Body mass index is 32.39 kg/m.    ECOG FS:0 - Asymptomatic  General: Well-developed, well-nourished, no acute distress. Eyes: Pink conjunctiva, anicteric sclera. HEENT: Normocephalic, moist mucous membranes. Lungs: Clear to auscultation bilaterally. Heart: Regular rate and rhythm. No rubs, murmurs, or gallops. Abdomen: Soft, nontender, nondistended. No organomegaly noted, normoactive bowel sounds. Musculoskeletal: No edema, cyanosis, or clubbing. Neuro: Alert, answering all questions appropriately. Cranial nerves grossly intact. Skin: No rashes or petechiae noted. Psych: Normal affect.  LAB RESULTS:  Lab Results  Component Value Date   NA 140 01/23/2018   K 4.5 01/23/2018   CL 109 01/23/2018   CO2 22 01/23/2018   GLUCOSE 198 (H) 01/23/2018   BUN 16 01/23/2018   CREATININE 1.29 (H) 01/23/2018    CALCIUM 9.1 01/23/2018   PROT 6.2 (L) 01/23/2018   ALBUMIN 3.7 01/23/2018   AST 90 (H) 01/23/2018   ALT 29 01/23/2018   ALKPHOS 112 01/23/2018   BILITOT 0.6 01/23/2018   GFRNONAA 43 (L) 01/23/2018   GFRAA 50 (L) 01/23/2018    Lab Results  Component Value Date   WBC 3.8 (L) 01/23/2018   NEUTROABS 2.1 01/23/2018   HGB 10.0 (L) 01/23/2018   HCT 32.8 (L) 01/23/2018   MCV 102.8 (H) 01/23/2018   PLT 320 01/23/2018     STUDIES: No results found.  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 Now with biopsy proven stage IV disease with lymph node and bone metastasis.  PLAN:    1.  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  Nuclear med bone scan on the same day was essentially unchanged.  With only minimal progression, will continue Ibrance 75 mg for 14 days on and 14 days off.  Patient was given a referral to interventional radiology for consideration of ablation of her liver lesion.  Continue Ibrance and letrozole as prescribed.  Proceed with Zometa today.  Return to clinic in 4 weeks for further evaluation and continuation of treatment. 2.  Osteopenia: Patient's bone mineral density on January 26, 2016 reported a T score of -0.9 which is considered normal. Continue calcium and vitamin D.  Zometa as above.   3.  Anemia: Hemoglobin is trending up and is now 10.0.  Monitor.   4. Peripheral neuropathy: Chronic and unchanged.  Continue gabapentin as prescribed. Neuropathy managed by neurology.  5. Back pain: MRI results from May 04, 2017 did not reveal metastatic disease.  Continue symptomatic treatment.  6.  Left IJ clot: Ultrasound results reviewed independently.  Patient has been instructed to  discontinue Eliquis. 7.  Leukopenia: Slowly improving.  Continue with dose reduced Ibrance as above. 8.  Dental complaints: Patient does not complain of this today.  No intervention needed at this time.  Patient has been instructed if she changes her mind about intervention or become symptomatic we will  readdress the issue at that time. 9.  Memory complaints: MRI of the brain on December 21, 2017 confirmed widespread osseous metastatic disease, but no intracranial abnormality was noted.  Patient expressed understanding and was in agreement with this plan. She also understands that She can call clinic at any time with any questions, concerns, or complaints.   Breast cancer   Staging form: Breast, AJCC 7th Edition     Pathologic stage from 08/11/2014: Stage IIIA (T0, N2a, cM0) - Signed by Lloyd Huger, MD on 08/11/2014   Lloyd Huger, MD   01/25/2018 1:19 PM

## 2018-01-22 DIAGNOSIS — M25612 Stiffness of left shoulder, not elsewhere classified: Secondary | ICD-10-CM | POA: Diagnosis not present

## 2018-01-22 DIAGNOSIS — M25512 Pain in left shoulder: Secondary | ICD-10-CM | POA: Diagnosis not present

## 2018-01-22 DIAGNOSIS — M7582 Other shoulder lesions, left shoulder: Secondary | ICD-10-CM | POA: Diagnosis not present

## 2018-01-23 ENCOUNTER — Other Ambulatory Visit: Payer: Self-pay

## 2018-01-23 ENCOUNTER — Inpatient Hospital Stay: Payer: Medicare Other | Attending: Oncology

## 2018-01-23 ENCOUNTER — Inpatient Hospital Stay: Payer: Medicare Other

## 2018-01-23 ENCOUNTER — Inpatient Hospital Stay (HOSPITAL_BASED_OUTPATIENT_CLINIC_OR_DEPARTMENT_OTHER): Payer: Medicare Other | Admitting: Oncology

## 2018-01-23 VITALS — BP 146/90 | HR 64 | Temp 98.6°F | Wt 188.7 lb

## 2018-01-23 DIAGNOSIS — D72819 Decreased white blood cell count, unspecified: Secondary | ICD-10-CM

## 2018-01-23 DIAGNOSIS — R5382 Chronic fatigue, unspecified: Secondary | ICD-10-CM | POA: Diagnosis not present

## 2018-01-23 DIAGNOSIS — I82C12 Acute embolism and thrombosis of left internal jugular vein: Secondary | ICD-10-CM

## 2018-01-23 DIAGNOSIS — K769 Liver disease, unspecified: Secondary | ICD-10-CM

## 2018-01-23 DIAGNOSIS — Z170 Estrogen receptor positive status [ER+]: Secondary | ICD-10-CM

## 2018-01-23 DIAGNOSIS — D649 Anemia, unspecified: Secondary | ICD-10-CM | POA: Insufficient documentation

## 2018-01-23 DIAGNOSIS — M8580 Other specified disorders of bone density and structure, unspecified site: Secondary | ICD-10-CM | POA: Insufficient documentation

## 2018-01-23 DIAGNOSIS — C50912 Malignant neoplasm of unspecified site of left female breast: Secondary | ICD-10-CM | POA: Diagnosis not present

## 2018-01-23 DIAGNOSIS — M545 Low back pain: Secondary | ICD-10-CM | POA: Diagnosis not present

## 2018-01-23 DIAGNOSIS — C7951 Secondary malignant neoplasm of bone: Secondary | ICD-10-CM

## 2018-01-23 DIAGNOSIS — R531 Weakness: Secondary | ICD-10-CM

## 2018-01-23 DIAGNOSIS — C50919 Malignant neoplasm of unspecified site of unspecified female breast: Secondary | ICD-10-CM

## 2018-01-23 DIAGNOSIS — C770 Secondary and unspecified malignant neoplasm of lymph nodes of head, face and neck: Secondary | ICD-10-CM | POA: Diagnosis not present

## 2018-01-23 DIAGNOSIS — G620 Drug-induced polyneuropathy: Secondary | ICD-10-CM | POA: Insufficient documentation

## 2018-01-23 LAB — CBC WITH DIFFERENTIAL/PLATELET
ABS IMMATURE GRANULOCYTES: 0.05 10*3/uL (ref 0.00–0.07)
BASOS PCT: 2 %
Basophils Absolute: 0.1 10*3/uL (ref 0.0–0.1)
Eosinophils Absolute: 0.3 10*3/uL (ref 0.0–0.5)
Eosinophils Relative: 8 %
HCT: 32.8 % — ABNORMAL LOW (ref 36.0–46.0)
Hemoglobin: 10 g/dL — ABNORMAL LOW (ref 12.0–15.0)
Immature Granulocytes: 1 %
Lymphocytes Relative: 17 %
Lymphs Abs: 0.7 10*3/uL (ref 0.7–4.0)
MCH: 31.3 pg (ref 26.0–34.0)
MCHC: 30.5 g/dL (ref 30.0–36.0)
MCV: 102.8 fL — ABNORMAL HIGH (ref 80.0–100.0)
Monocytes Absolute: 0.7 10*3/uL (ref 0.1–1.0)
Monocytes Relative: 18 %
NEUTROS ABS: 2.1 10*3/uL (ref 1.7–7.7)
Neutrophils Relative %: 54 %
PLATELETS: 320 10*3/uL (ref 150–400)
RBC: 3.19 MIL/uL — ABNORMAL LOW (ref 3.87–5.11)
RDW: 15.4 % (ref 11.5–15.5)
WBC: 3.8 10*3/uL — ABNORMAL LOW (ref 4.0–10.5)
nRBC: 0 % (ref 0.0–0.2)

## 2018-01-23 LAB — COMPREHENSIVE METABOLIC PANEL
ALT: 29 U/L (ref 0–44)
AST: 90 U/L — ABNORMAL HIGH (ref 15–41)
Albumin: 3.7 g/dL (ref 3.5–5.0)
Alkaline Phosphatase: 112 U/L (ref 38–126)
Anion gap: 9 (ref 5–15)
BUN: 16 mg/dL (ref 8–23)
CO2: 22 mmol/L (ref 22–32)
Calcium: 9.1 mg/dL (ref 8.9–10.3)
Chloride: 109 mmol/L (ref 98–111)
Creatinine, Ser: 1.29 mg/dL — ABNORMAL HIGH (ref 0.44–1.00)
GFR, EST AFRICAN AMERICAN: 50 mL/min — AB (ref >60)
GFR, EST NON AFRICAN AMERICAN: 43 mL/min — AB (ref >60)
Glucose, Bld: 198 mg/dL — ABNORMAL HIGH (ref 70–99)
Potassium: 4.5 mmol/L (ref 3.5–5.1)
Sodium: 140 mmol/L (ref 135–145)
Total Bilirubin: 0.6 mg/dL (ref 0.3–1.2)
Total Protein: 6.2 g/dL — ABNORMAL LOW (ref 6.5–8.1)

## 2018-01-23 LAB — SAMPLE TO BLOOD BANK

## 2018-01-23 MED ORDER — ZOLEDRONIC ACID 4 MG/100ML IV SOLN
4.0000 mg | Freq: Once | INTRAVENOUS | Status: AC
  Administered 2018-01-23: 4 mg via INTRAVENOUS
  Filled 2018-01-23: qty 100

## 2018-01-23 MED ORDER — HEPARIN SOD (PORK) LOCK FLUSH 100 UNIT/ML IV SOLN
500.0000 [IU] | Freq: Once | INTRAVENOUS | Status: AC
  Administered 2018-01-23: 500 [IU] via INTRAVENOUS

## 2018-01-23 MED ORDER — SODIUM CHLORIDE 0.9 % IV SOLN
Freq: Once | INTRAVENOUS | Status: AC
  Administered 2018-01-23: 11:00:00 via INTRAVENOUS
  Filled 2018-01-23: qty 250

## 2018-01-23 NOTE — Progress Notes (Signed)
Per Herb Grays CMA per Dr. Grayland Ormond okay to proceed with Zometa with creatinine of 1.29 and b/p 146/90.

## 2018-01-23 NOTE — Progress Notes (Signed)
Patient is here today to follow up on her metastatic breast cancer. Patient stated that she had been doing well with no complaints.

## 2018-01-25 ENCOUNTER — Telehealth: Payer: Self-pay

## 2018-01-25 DIAGNOSIS — M25512 Pain in left shoulder: Secondary | ICD-10-CM | POA: Diagnosis not present

## 2018-01-25 DIAGNOSIS — M25612 Stiffness of left shoulder, not elsewhere classified: Secondary | ICD-10-CM | POA: Diagnosis not present

## 2018-01-25 DIAGNOSIS — M7582 Other shoulder lesions, left shoulder: Secondary | ICD-10-CM | POA: Diagnosis not present

## 2018-01-25 NOTE — Telephone Encounter (Signed)
Referral to IR was faxed. Called their office and left them a message letting them know that I had faxed patient's referral and to please schedule an appointment for patient to have a live ablation.

## 2018-01-28 ENCOUNTER — Other Ambulatory Visit: Payer: Self-pay | Admitting: Oncology

## 2018-01-28 DIAGNOSIS — C50919 Malignant neoplasm of unspecified site of unspecified female breast: Secondary | ICD-10-CM

## 2018-01-28 DIAGNOSIS — C787 Secondary malignant neoplasm of liver and intrahepatic bile duct: Principal | ICD-10-CM

## 2018-01-29 ENCOUNTER — Ambulatory Visit
Admission: RE | Admit: 2018-01-29 | Discharge: 2018-01-29 | Disposition: A | Discharge status: Home / Self Care | Payer: Private Health Insurance - Indemnity | Source: Ambulatory Visit | Attending: Oncology | Admitting: Oncology

## 2018-01-29 ENCOUNTER — Encounter: Payer: Self-pay | Admitting: Radiology

## 2018-01-29 ENCOUNTER — Other Ambulatory Visit: Payer: Self-pay | Admitting: Radiology

## 2018-01-29 DIAGNOSIS — M7582 Other shoulder lesions, left shoulder: Secondary | ICD-10-CM | POA: Diagnosis not present

## 2018-01-29 DIAGNOSIS — M25512 Pain in left shoulder: Secondary | ICD-10-CM | POA: Diagnosis not present

## 2018-01-29 DIAGNOSIS — C787 Secondary malignant neoplasm of liver and intrahepatic bile duct: Secondary | ICD-10-CM | POA: Diagnosis not present

## 2018-01-29 DIAGNOSIS — M25612 Stiffness of left shoulder, not elsewhere classified: Secondary | ICD-10-CM | POA: Diagnosis not present

## 2018-01-29 DIAGNOSIS — Z853 Personal history of malignant neoplasm of breast: Secondary | ICD-10-CM | POA: Diagnosis not present

## 2018-01-29 DIAGNOSIS — C50919 Malignant neoplasm of unspecified site of unspecified female breast: Secondary | ICD-10-CM

## 2018-01-29 HISTORY — PX: IR RADIOLOGIST EVAL & MGMT: IMG5224

## 2018-01-29 NOTE — Consult Note (Signed)
Chief Complaint: Patient was seen in consultation today for  Chief Complaint  Patient presents with  . Consult    Consult for Liver Metastasis     at the request of Finnegan,Timothy J  Referring Physician(s): Finnegan,Timothy J  History of Present Illness: Amanda [name] is a 68 y.o. 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  Metastatic disease to include an isolated liver lesion and nodal metastatic disease.  Amanda [name] is currently on Ibrance and Zometa.  Amanda [name] presents at the kind request of Dr. Grayland Ormond to evaluate for local treatment of Amanda [name] liver lesion which has mildly enlarged on imaging surveillance.  Amanda [name] is currently in Amanda [name] usual state of health.  Amanda [name] does have fatigue but remains active more than 50% of the day.  Amanda [name] is able to perform all of Amanda [name] activities of daily living and participates in some light house chores.  Amanda [name] husband is with Amanda [name] and is very supportive.  Amanda [name] denies current pain, shortness of breath, fever, chills or other systemic symptoms.  Past Medical History:  Diagnosis Date  . Back pain    occasionally  . Breast cancer ([place]) 2009   left  . Depression    takes Paxil and Wellbutrin daily  . Diabetes ([place])    takes Metformin daily  . Genetic testing 02/03/2017   Multi-Cancer panel (83 genes) @ [facility] - No pathogenic mutations detected  . GERD (gastroesophageal reflux disease)   . History of bronchitis 2 yrs ago  . Hyperlipidemia    takes Atorvastatin daily  . Hypertension    takes Losartan-HCTZ daily  . Insomnia    takes Ambien nightly  . Insomnia    takes gabapentin nightly  . Joint pain   . Migraine   . Mood swings   . Osteoarthritis of knee   . Personal history of chemotherapy 12/05/2016   Mets from Breast Cancer  . Personal history of radiation therapy 11/2016  . PONV (postoperative nausea and vomiting)   . Seasonal  allergies    takes Allegra daily    Past Surgical History:  Procedure Laterality Date  . BREAST BIOPSY  2009  . cataract surgery Bilateral   . COLONOSCOPY    . IR RADIOLOGIST EVAL & MGMT  01/29/2018  . JOINT REPLACEMENT Left 2014   knee  . KNEE ARTHROSCOPY Left    x 5  . MASTECTOMY Left   . port a cath placed    . PORTACATH PLACEMENT N/A 09/17/2015   Procedure: INSERTION PORT-A-CATH;  Surgeon: Jules Husbands, MD;  Location: [facility] ORS;  Service: General;  Laterality: N/A;  . TOTAL SHOULDER ARTHROPLASTY Right 06/03/2015   Procedure: TOTAL SHOULDER ARTHROPLASTY;  Surgeon: Tania Ade, MD;  Location: [place];  Service: Orthopedics;  Laterality: Right;  Right total shoulder arthroplasty  . TOTAL SHOULDER REPLACEMENT Right 06/03/2015  . TUBAL LIGATION      Allergies: Ace inhibitors; Latex; Morphine and related; Other; and Penicillins  Medications: Prior to Admission medications   Medication Sig Start Date End Date Taking? Authorizing Provider  acetaminophen (TYLENOL) 500 MG tablet Take 500 mg by mouth every 6 (six) hours as needed for mild pain or moderate pain.     [provider]  atorvastatin (LIPITOR) 10 MG tablet Take 10 mg by mouth every evening.     [provider]  buPROPion (WELLBUTRIN XL) 150 MG 24 hr tablet Take  150 mg by mouth every evening.  08/08/13   [provider]  Calcium-Magnesium-Vitamin D (CALCIUM 1200+D3 PO) Take 1 tablet by mouth daily.     [provider]  Cyanocobalamin 5000 MCG CAPS Take 5,000 mcg by mouth daily.     [provider]  dexamethasone (DECADRON) 4 MG tablet Take 1 tablet (4 mg total) by mouth every other day. 10/24/17   Lloyd Huger, MD  ferrous sulfate 325 (65 FE) MG EC tablet Take 325 mg by mouth daily.     [provider]  fexofenadine (ALLEGRA) 180 MG tablet Take 180 mg by mouth at bedtime.     [provider]  gabapentin (NEURONTIN) 300 MG capsule Take 600-900 mg by mouth See  admin instructions. Take 2 capsules ('600MG'$ ) by mouth every morning, 2 capsules ('600MG'$ )by mouth  at noon and 3 capsules ('900MG'$ ) by mouth every night    [provider]  HYDROcodone-acetaminophen (NORCO/VICODIN) 5-325 MG tablet TAKE 1 TABLET BY MOUTH EVERY 4 TO 6 HOURS AS NEEDED FOR PAIN 12/10/17   [provider]  IBRANCE 75 MG capsule TAKE 1 CAPSULE (75 MG TOTAL) BY MOUTH DAILY WITH BREAKFAST. TAKE FOR 14 DAYS, THEN 14 DAYS OFF. 01/14/18   Lloyd Huger, MD  letrozole [facility]) 2.5 MG tablet Take 1 tablet (2.5 mg total) by mouth daily. 07/04/17   Lloyd Huger, MD  lidocaine-prilocaine (EMLA) cream Apply 1 application topically as needed. Apply to port 1-2 hours prior to chemotherapy appointment. Cover with plastic wrap. 09/27/15   Lloyd Huger, MD  metFORMIN (GLUCOPHAGE) 850 MG tablet Take 850 mg by mouth 2 (two) times daily with a meal.     [provider]  pantoprazole (PROTONIX) 40 MG tablet Take 40 mg by mouth every evening.     [provider]  PARoxetine (PAXIL) 40 MG tablet Take 40 mg by mouth every evening.  08/08/13   [provider]  prochlorperazine (COMPAZINE) 10 MG tablet Take 1 tablet (10 mg total) by mouth every 6 (six) hours as needed for nausea or vomiting. 09/03/17   Lloyd Huger, MD  Zolpidem Tartrate (AMBIEN PO) Take 10 mg by mouth at bedtime as needed.     [provider]     Family History  Problem Relation Age of Onset  . Atrial fibrillation Mother        deceased 38; TAH/BSO  . Non-Hodgkin's lymphoma Father        deceased 2  . Heart attack Maternal Uncle   . Melanoma Maternal Uncle   . Breast cancer Paternal Aunt        dx in 90s  . Breast cancer Paternal Aunt        dx in 44s  . Cancer Brother 37       unk. type  . Breast cancer Other        Mat grandmother's 2 sisters; dx at 30 and 36  . Stomach cancer Paternal Grandfather 9       deceased 63s  . Breast cancer Cousin        daughter  of pat aunt with breast ca    Social History   Socioeconomic History  . Marital status: Married    Spouse name: Not on file  . Number of children: Not on file  . Years of education: Not on file  . Highest education level: Not on file  Occupational History  . Not on file  Social Needs  .  Financial resource strain: Not on file  . Food insecurity:    Worry: Not on file    Inability: Not on file  . Transportation needs:    Medical: Not on file    Non-medical: Not on file  Tobacco Use  . Smoking status: Never Smoker  . Smokeless tobacco: Never Used  Substance and Sexual Activity  . Alcohol use: Yes    Alcohol/week: 0.0 standard drinks    Comment: occasionally wine  . Drug use: Yes    Types: Marijuana    Comment: cannabis with no extra  . Sexual activity: Yes    Birth control/protection: Post-menopausal  Lifestyle  . Physical activity:    Days per week: Not on file    Minutes per session: Not on file  . Stress: Not on file  Relationships  . Social connections:    Talks on phone: Not on file    Gets together: Not on file    Attends religious service: Not on file    Active member of club or organization: Not on file    Attends meetings of clubs or organizations: Not on file    Relationship status: Not on file  Other Topics Concern  . Not on file  Social History Narrative  . Not on file    ECOG Status: 2 - Symptomatic, <50% confined to bed  Review of Systems: A 12 point ROS discussed and pertinent positives are indicated in the HPI above.  All other systems are negative.  Review of Systems  Vital Signs: BP 135/71   Pulse 78   Temp 98.5 F (36.9 C) (Oral)   Resp 14   Ht 5' (1.524 m)   Wt 85.3 kg   SpO2 97%   BMI 36.72 kg/m   Physical Exam Nursing note reviewed. Exam conducted with a chaperone present.  Constitutional:      Appearance: Normal appearance.  HENT:     Head: Normocephalic and atraumatic.  Eyes:     General: No scleral  icterus. Cardiovascular:     Rate and Rhythm: Normal rate and regular rhythm.  Pulmonary:     Effort: Pulmonary effort is normal.  Abdominal:     General: Abdomen is flat.     Palpations: Abdomen is soft.     Tenderness: There is no abdominal tenderness.  Skin:    General: Skin is warm and dry.  Neurological:     Mental Status: Amanda [name] is alert and oriented to person, place, and time.  Psychiatric:        Mood and Affect: Mood normal.        Behavior: Behavior normal.     Imaging: Ir Radiologist Eval & Mgmt  Result Date: 01/29/2018 Please refer to notes tab for details about interventional procedure. (Op Note)   Labs:  CBC: Recent Labs    11/21/17 0943 11/28/17 1403 12/19/17 0838 01/23/18 0923  WBC 3.8* 3.6* 2.7* 3.8*  HGB 9.1* 9.9* 9.1* 10.0*  HCT 28.7* 32.3* 29.1* 32.8*  PLT 171 251 220 320    COAGS: No results for input(s): INR, APTT in the last 8760 hours.  BMP: Recent Labs    11/21/17 0943 11/28/17 1403 12/19/17 0838 01/23/18 0923  NA 134* 136 140 140  K 4.3 4.3 4.1 4.5  CL 105 103 109 109  CO2 _0 GLUCOSE 196* 143* 235* 198*  BUN 20 31* 14 16  CALCIUM 8.9 9.8 8.4* 9.1  CREATININE 0.97 1.50* 0.95 1.29*  GFRNONAA 60*  35* >60 43*  GFRAA >60 41* >60 50*    LIVER FUNCTION TESTS: Recent Labs    11/21/17 0943 11/28/17 1403 12/19/17 0838 01/23/18 0923  BILITOT 0.3 0.3 0.5 0.6  AST 66* 99* 57* 90*  ALT 54* 62* 23 29  ALKPHOS 80 84 73 112  PROT 5.9* 6.6 5.8* 6.2*  ALBUMIN 3.3* 3.8 3.2* 3.7    TUMOR MARKERS: No results for input(s): AFPTM, CEA, CA199, CHROMGRNA in the last 8760 hours.  Assessment and Plan:  Stage IV metastatic breast cancer with a enlarging isolated hepatic metastasis.  The size of Amanda [name] lesion on MRI is approximately 2 cm which is well within the capabilities of a percutaneous vocal ablation.  We discussed the risks, benefits and alternatives to percutaneous thermal ablation at length.  Amanda [name] understands that we have a  very excellent (approximately 90-95%) probability of obtaining durable local control of this lesion with a low risk of bleeding, infection and marginal recurrence.  Amanda [name] also understands that Amanda [name] may develop additional hepatic metastases in the future which may require treatment, either additional ablation, or perhaps another modality such as Y 90.  Amanda [name] understands we will be monitoring Amanda [name] with surveillance.  Amanda [name] and Amanda [name] husband are in agreement and desire to proceed with percutaneous thermal ablation.  1.)  Schedule for percutaneous thermal ablation with microwave at [facility] as soon as possible.  Thank you for this interesting consult.  I greatly enjoyed meeting Amanda [name] and look forward to participating in their care.  A copy of this report was sent to the requesting provider on this date.  Electronically Signed: Jacqulynn Cadet 01/29/2018, 9:10 AM   I spent a total of  40 Minutes   in face to face in clinical consultation, greater than 50% of which was counseling/coordinating care for breast cancer metastatic to the liver.

## 2018-01-30 ENCOUNTER — Other Ambulatory Visit (HOSPITAL_COMMUNITY): Payer: Self-pay | Admitting: Interventional Radiology

## 2018-01-30 DIAGNOSIS — C787 Secondary malignant neoplasm of liver and intrahepatic bile duct: Secondary | ICD-10-CM

## 2018-01-30 NOTE — Telephone Encounter (Signed)
Consult was yesterday with Dr. Geroge Baseman at [facility].

## 2018-01-31 ENCOUNTER — Telehealth: Payer: Self-pay | Admitting: *Deleted

## 2018-01-31 NOTE — Telephone Encounter (Signed)
Can we please move patients appts to 2/12? Thanks.

## 2018-01-31 NOTE — Telephone Encounter (Signed)
IR is trying to schedule patient for her ablation for 2/5, but patient has a conflicting appointment here so IR is asking if we could move our appointment to 2/4 instead of the 5th. Please return call to [place]

## 2018-01-31 NOTE — Telephone Encounter (Signed)
Please move her appt with [place] back one week, then I can see how's she's doing post-ablation.  Thanks.

## 2018-01-31 NOTE — Telephone Encounter (Signed)
Forwarded message to [name] to get patient rescheduled, Tiffany made aware to proceed with scheduling ablation for 2/5.

## 2018-02-01 DIAGNOSIS — M25512 Pain in left shoulder: Secondary | ICD-10-CM | POA: Diagnosis not present

## 2018-02-01 DIAGNOSIS — M25612 Stiffness of left shoulder, not elsewhere classified: Secondary | ICD-10-CM | POA: Diagnosis not present

## 2018-02-01 DIAGNOSIS — M7582 Other shoulder lesions, left shoulder: Secondary | ICD-10-CM | POA: Diagnosis not present

## 2018-02-05 DIAGNOSIS — M25512 Pain in left shoulder: Secondary | ICD-10-CM | POA: Diagnosis not present

## 2018-02-05 DIAGNOSIS — M25612 Stiffness of left shoulder, not elsewhere classified: Secondary | ICD-10-CM | POA: Diagnosis not present

## 2018-02-05 DIAGNOSIS — M7582 Other shoulder lesions, left shoulder: Secondary | ICD-10-CM | POA: Diagnosis not present

## 2018-02-08 DIAGNOSIS — M25512 Pain in left shoulder: Secondary | ICD-10-CM | POA: Diagnosis not present

## 2018-02-08 DIAGNOSIS — M25612 Stiffness of left shoulder, not elsewhere classified: Secondary | ICD-10-CM | POA: Diagnosis not present

## 2018-02-08 DIAGNOSIS — M7582 Other shoulder lesions, left shoulder: Secondary | ICD-10-CM | POA: Diagnosis not present

## 2018-02-12 ENCOUNTER — Ambulatory Visit: Payer: Medicare Other | Attending: Family Medicine | Admitting: Occupational Therapy

## 2018-02-12 DIAGNOSIS — M25512 Pain in left shoulder: Secondary | ICD-10-CM | POA: Diagnosis not present

## 2018-02-12 DIAGNOSIS — M7582 Other shoulder lesions, left shoulder: Secondary | ICD-10-CM | POA: Diagnosis not present

## 2018-02-12 DIAGNOSIS — I972 Postmastectomy lymphedema syndrome: Secondary | ICD-10-CM

## 2018-02-12 DIAGNOSIS — M25612 Stiffness of left shoulder, not elsewhere classified: Secondary | ICD-10-CM | POA: Diagnosis not present

## 2018-02-12 NOTE — Progress Notes (Signed)
ekg 11-08-17 epic  cxr 11-05-17 epic

## 2018-02-12 NOTE — Progress Notes (Signed)
Please place orders epic pt. Has a preop 02-13-18 Thank you!

## 2018-02-12 NOTE — Therapy (Signed)
[facility] [address] [place], [address] Phone: [phone]   Fax:  [phone]  Occupational Therapy Screen   Patient Details  Name: Amanda [name] MRN: [id number] Date of Birth: 1950-01-25 No data recorded  Encounter Date: 02/12/2018    Past Medical History:  Diagnosis Date  . Back pain    occasionally  . Breast cancer ([place]) 2009   left  . Depression    takes Paxil and Wellbutrin daily  . Diabetes ([place])    takes Metformin daily  . Genetic testing 02/03/2017   Multi-Cancer panel (83 genes) @ [facility] - No pathogenic mutations detected  . GERD (gastroesophageal reflux disease)   . History of bronchitis 2 yrs ago  . Hyperlipidemia    takes Atorvastatin daily  . Hypertension    takes Losartan-HCTZ daily  . Insomnia    takes Ambien nightly  . Insomnia    takes gabapentin nightly  . Joint pain   . Migraine   . Mood swings   . Osteoarthritis of knee   . Personal history of chemotherapy 12/05/2016   Mets from Breast Cancer  . Personal history of radiation therapy 11/2016  . PONV (postoperative nausea and vomiting)   . Seasonal allergies    takes Allegra daily    Past Surgical History:  Procedure Laterality Date  . BREAST BIOPSY  2009  . cataract surgery Bilateral   . COLONOSCOPY    . IR RADIOLOGIST EVAL & MGMT  01/29/2018  . JOINT REPLACEMENT Left 2014   knee  . KNEE ARTHROSCOPY Left    x 5  . MASTECTOMY Left   . port a cath placed    . PORTACATH PLACEMENT N/A 09/17/2015   Procedure: INSERTION PORT-A-CATH;  Surgeon: Jules Husbands, MD;  Location: [facility] ORS;  Service: General;  Laterality: N/A;  . TOTAL SHOULDER ARTHROPLASTY Right 06/03/2015   Procedure: TOTAL SHOULDER ARTHROPLASTY;  Surgeon: Tania Ade, MD;  Location: [place];  Service: Orthopedics;  Laterality: Right;  Right total shoulder arthroplasty  . TOTAL SHOULDER REPLACEMENT Right 06/03/2015  . TUBAL LIGATION      There were  no vitals filed for this visit.  Subjective Assessment - 02/12/18 1405    Subjective   You told me to come and see you again after I am done with PT for my shoulder, and how my measurements look - I have procedure next week on my liver for a spot     Patient Stated Goals  Patient reports she wants to control the swelling in her left arm, be able to take care of herself.           LYMPHEDEMA/ONCOLOGY QUESTIONNAIRE - 02/12/18 1212      Left Upper Extremity Lymphedema   15 cm Proximal to Olecranon Process  38.8 cm    10 cm Proximal to Olecranon Process  38 cm    Olecranon Process  27 cm    15 cm Proximal to Ulnar Styloid Process  26 cm    10 cm Proximal to Ulnar Styloid Process  22.6 cm    Just Proximal to Ulnar Styloid Process  17.2 cm    Across Hand at [employer]  20.6 cm    At [place] of 2nd Digit  6.8 cm    At [facility] of Thumb  7 cm        Pt 's measurements decrease in L UE since seen in Dec - pt report she  did loose some weight again  But OT did quick assessment on R compare to 2018 measurements - but appear her compression sleeves are maintaining her lymphedema good She use at night time tubigrip E - provided her wide velcro for top band to decrease risk for rolling  And then daytime ( [place] strong) - wear her sleeves with high risk act  WIth donning her sleeve she cannot stabilize shoulder - pt to stand against wall or sit in high back chair to block shoulder for husband to donn sleeve  Pt having spot on liver treated next week  Pt will contact me as needed              OT Education - 02/12/18 1410    Education provided  Yes    Education Details  donning of sleeve - position to make it easier for husband to put it on     Person(s) Educated  Patient    Methods  Explanation;Demonstration    Comprehension  Verbalized understanding;Returned demonstration          OT Long Term Goals - 03/19/17 1349      OT LONG TERM GOAL #1   Title  Patient will be  independent in wear, care and schedule of compression garments.     Baseline  just found last week prosthesis and compression bra with jovipak she likes and will wear but need new compression daysleeve     Time  8    Period  Weeks    Status  On-going    Target Date  05/14/17      OT LONG TERM GOAL #2   Title  Patient will demonstrate HEP with modified independence.     Status  Achieved      OT LONG TERM GOAL #3   Title  Patient will decrease circumferential measurements by 1.5 cm from mid forearm to upper arm to fit into compression garment and for maintenace of lymphedema symptoms.     Status  Achieved              Patient will benefit from skilled therapeutic intervention in order to improve the following deficits and impairments:     Visit Diagnosis: Postmastectomy lymphedema    Problem List Patient Active Problem List   Diagnosis Date Noted  . CAP (community acquired pneumonia) 11/03/2017  . Genetic testing 02/03/2017  . Contusion of knee 12/06/2016  . Dislocation of shoulder joint 12/06/2016  . Shoulder joint pain 12/06/2016  . Localized, primary osteoarthritis 12/06/2016  . Osteoarthritis of knee 12/06/2016  . Burning sensation 11/13/2016  . Numbness and tingling 11/13/2016  . Malignant neoplasm of left breast in female, estrogen receptor positive ([place]) 10/05/2016  . Carcinoma of left breast, stage 4 ([place]) 04/06/2016  . Chemotherapy-induced neuropathy ([place]) 03/07/2016  . Metastatic breast cancer ([place]) 09/24/2015  . 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 Personal history of breast cancer 08/23/2015  . 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 Degenerative arthritis of right shoulder region 01/13/2015  . Cancer of left female breast  ([place]) 07/27/2014  . 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 Rotator cuff tear 06/10/2014  . Allergic rhinitis, seasonal 09/16/2013  . Depression 08/10/2013  .  Diabetes mellitus type 2, uncomplicated ([place]) 19/41/7408  . Hyperlipidemia, unspecified 08/10/2013  . BP (high blood pressure) 08/10/2013  . 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, [place], [address] Phone: [phone]   Fax:  [phone]  Name: Amanda [name] MRN: [id number] Date of Birth: 1950-01-30

## 2018-02-12 NOTE — Patient Instructions (Signed)
Amanda [name]  02/12/2018   Your procedure is scheduled on: 02-20-18  Report to [facility] Main  Entrance  Report to admitting at         [place]  AM    Call this number if you have problems the morning of surgery [phone]    Remember: Do not eat food or drink liquids :After Midnight.  BRUSH YOUR TEETH MORNING OF SURGERY AND RINSE YOUR MOUTH OUT, NO CHEWING GUM CANDY OR MINTS.     Take these medicines the morning of surgery with A SIP OF WATER: letrozole, Ibrance , Gabapentin DO NOT TAKE ANY DIABETIC MEDICATIONS DAY OF YOUR SURGERY                               You may not have any metal on your body including hair pins and              piercings  Do not wear jewelry, make-up, lotions, powders or perfumes, deodorant             Do not wear nail polish.  Do not shave  48 hours prior to surgery.             Do not bring valuables to the hospital. [place].  Contacts, dentures or bridgework may not be worn into surgery.  Leave suitcase in the car. After surgery it may be brought to your room.                 Please read over the following fact sheets you were given: _____________________________________________________________________           [facility] - Preparing for Surgery Before surgery, you can play an important role.  Because skin is not sterile, your skin needs to be as free of germs as possible.  You can reduce the number of germs on your skin by washing with CHG (chlorahexidine gluconate) soap before surgery.  CHG is an antiseptic cleaner which kills germs and bonds with the skin to continue killing germs even after washing. Please DO NOT use if you have an allergy to CHG or antibacterial soaps.  If your skin becomes reddened/irritated stop using the CHG and inform your nurse when you arrive at Short Stay. Do not shave (including legs and underarms) for at least 48 hours prior to the first CHG  shower.  You may shave your face/neck. Please follow these instructions carefully:  1.  Shower with CHG Soap the night before surgery and the  morning of Surgery.  2.  If you choose to wash your hair, wash your hair first as usual with your  normal  shampoo.  3.  After you shampoo, rinse your hair and body thoroughly to remove the  shampoo.                           4.  Use CHG as you would any other liquid soap.  You can apply chg directly  to the skin and wash                       Gently with a scrungie or clean washcloth.  5.  Apply  the CHG Soap to your body ONLY FROM THE NECK DOWN.   Do not use on face/ open                           Wound or open sores. Avoid contact with eyes, ears mouth and genitals (private parts).                       Wash face,  Genitals (private parts) with your normal soap.             6.  Wash thoroughly, paying special attention to the area where your surgery  will be performed.  7.  Thoroughly rinse your body with warm water from the neck down.  8.  DO NOT shower/wash with your normal soap after using and rinsing off  the CHG Soap.                9.  Pat yourself dry with a clean towel.            10.  Wear clean pajamas.            11.  Place clean sheets on your bed the night of your first shower and do not  sleep with pets. Day of Surgery : Do not apply any lotions/deodorants the morning of surgery.  Please wear clean clothes to the hospital/surgery center.  FAILURE TO FOLLOW THESE INSTRUCTIONS MAY RESULT IN THE CANCELLATION OF YOUR SURGERY PATIENT SIGNATURE_________________________________  NURSE SIGNATURE__________________________________  ________________________________________________________________________

## 2018-02-13 ENCOUNTER — Encounter (HOSPITAL_COMMUNITY)
Admission: RE | Admit: 2018-02-13 | Discharge: 2018-02-13 | Disposition: A | Discharge status: Home / Self Care | Payer: Medicare Other | Source: Ambulatory Visit | Attending: Interventional Radiology | Admitting: Interventional Radiology

## 2018-02-13 ENCOUNTER — Other Ambulatory Visit: Payer: Self-pay

## 2018-02-13 ENCOUNTER — Encounter (HOSPITAL_COMMUNITY): Payer: Self-pay

## 2018-02-13 DIAGNOSIS — Z01812 Encounter for preprocedural laboratory examination: Secondary | ICD-10-CM | POA: Insufficient documentation

## 2018-02-13 HISTORY — DX: Personal history of urinary calculi: Z87.442

## 2018-02-13 HISTORY — DX: Anxiety disorder, unspecified: F41.9

## 2018-02-13 HISTORY — DX: Pneumonia, unspecified organism: J18.9

## 2018-02-13 LAB — BASIC METABOLIC PANEL
Anion gap: 9 (ref 5–15)
BUN: 21 mg/dL (ref 8–23)
CO2: 22 mmol/L (ref 22–32)
Calcium: 8.9 mg/dL (ref 8.9–10.3)
Chloride: 108 mmol/L (ref 98–111)
Creatinine, Ser: 1.32 mg/dL — ABNORMAL HIGH (ref 0.44–1.00)
GFR calc Af Amer: 49 mL/min — ABNORMAL LOW (ref >60)
GFR calc non Af Amer: 42 mL/min — ABNORMAL LOW (ref >60)
GLUCOSE: 119 mg/dL — AB (ref 70–99)
Potassium: 4.8 mmol/L (ref 3.5–5.1)
Sodium: 139 mmol/L (ref 135–145)

## 2018-02-13 LAB — CBC
HCT: 32.4 % — ABNORMAL LOW (ref 36.0–46.0)
Hemoglobin: 9.9 g/dL — ABNORMAL LOW (ref 12.0–15.0)
MCH: 32.1 pg (ref 26.0–34.0)
MCHC: 30.6 g/dL (ref 30.0–36.0)
MCV: 105.2 fL — ABNORMAL HIGH (ref 80.0–100.0)
Platelets: 196 10*3/uL (ref 150–400)
RBC: 3.08 MIL/uL — ABNORMAL LOW (ref 3.87–5.11)
RDW: 15.7 % — ABNORMAL HIGH (ref 11.5–15.5)
WBC: 1.9 10*3/uL — ABNORMAL LOW (ref 4.0–10.5)
nRBC: 0 % (ref 0.0–0.2)

## 2018-02-13 LAB — HEMOGLOBIN A1C
Hgb A1c MFr Bld: 6 % — ABNORMAL HIGH (ref 4.8–5.6)
Mean Plasma Glucose: 125.5 mg/dL

## 2018-02-13 NOTE — Progress Notes (Signed)
PCP:    Thereasa Distance   CARDIOLOGIST: none  INFO IN Epic:  WBC 1.9, hgb 9.9  INFO ON CHART:  BLOOD THINNERS AND LAST DOSES:  none ____________________________________  PATIENT SYMPTOMS AT TIME OF PREOP:  none

## 2018-02-15 DIAGNOSIS — M25512 Pain in left shoulder: Secondary | ICD-10-CM | POA: Diagnosis not present

## 2018-02-15 DIAGNOSIS — M25612 Stiffness of left shoulder, not elsewhere classified: Secondary | ICD-10-CM | POA: Diagnosis not present

## 2018-02-15 DIAGNOSIS — M7582 Other shoulder lesions, left shoulder: Secondary | ICD-10-CM | POA: Diagnosis not present

## 2018-02-18 NOTE — Anesthesia Preprocedure Evaluation (Addendum)
Anesthesia Evaluation  Patient identified by MRN, date of birth, ID band Patient awake    Reviewed: Allergy & Precautions, NPO status , Patient's Chart, lab work & pertinent test results  History of Anesthesia Complications (+) PONV and history of anesthetic complications  Airway Mallampati: II  TM Distance: >3 FB Neck ROM: Full    Dental  (+) Poor Dentition   Pulmonary pneumonia, resolved,    Pulmonary exam normal breath sounds clear to auscultation       Cardiovascular hypertension, Pt. 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  Neuromuscular disease    GI/Hepatic GERD  Medicated,Metastatic liver disease   Endo/Other  diabetes, Well Controlled, Type 2, Oral Hypoglycemic AgentsHyperlipidemia Hx/o left breast Ca S/P ChemoRx and RT  Renal/GU Hx/o renal calculi  negative genitourinary   Musculoskeletal  (+) Arthritis , Osteoarthritis,    Abdominal (+) + obese,   Peds  Hematology  (+) anemia ,   Anesthesia Other Findings   Reproductive/Obstetrics                           Anesthesia Physical Anesthesia Plan  ASA: III  Anesthesia Plan: General   Post-op Pain Management:    Induction: Intravenous  PONV Risk Score and Plan: 4 or greater and Scopolamine patch - Pre-op, Midazolam, Dexamethasone, Ondansetron and Treatment may vary due to age or medical condition  Airway Management Planned: Oral ETT  Additional Equipment:   Intra-op Plan:   Post-operative Plan: Extubation in OR  Informed Consent: I have reviewed the patients History and Physical, chart, labs and discussed the procedure including the risks, benefits and alternatives for the proposed anesthesia with the patient or authorized representative who has indicated his/her understanding and acceptance.     Dental  advisory given  Plan Discussed with: CRNA and Surgeon  Anesthesia Plan Comments: (See PST note 02/13/18, Konrad Felix, PA-C)      Anesthesia Quick Evaluation

## 2018-02-18 NOTE — Progress Notes (Signed)
Anesthesia Chart Review   Case:  [id number] Date/Time:  02/20/18 0815   Procedure:  CT WITH ANESTHESIA MICROWAVE THERMAL ABLATION-LIVER (N/A )   Anesthesia type:  General   Pre-op diagnosis:  LIVER METS   Location:  [facility]   Surgeon:  Jacqulynn Cadet, MD      DISCUSSION: 68 yo never smoker with h/o PONV, HLD, GERD, anxiety, depression, HTN, DM II, anemia, breast cancer with liver METS scheduled for above procedure on 02/20/18 with Dr. Rainey Pines.     Pt with stage IV breast cancer, currently treated with Ibrance and Zometa; liver METS scheduled for ablation with IR. Last seen by Oncologist, Dr. Delight Hoh, on 01/23/2018.   Hemoglobin 9.9 at PST visit 02/13/18.  WBC 1.9 at PST visit.  Dr. Grayland Ormond is aware of anemia and leukopenia, per his last note following.  Labs reviewed by Dr. Laurence Ferrari. Lab results forwarded to Dr. Grayland Ormond.   VS: BP 133/70   Pulse 84   Temp 36.8 C (Oral)   Resp 16   Ht 5\' 3"  (1.6 m)   Wt 83.5 kg   SpO2 97%   BMI 32.59 kg/m   PROVIDERS: Sofie Hartigan, MD is PCP   Lloyd Huger, MD is Oncologist  Pleas Patricia, MD is Cardiologist last seen 10/09/17 LABS: Labs reviewed: Acceptable for surgery. (all labs ordered are listed, but only abnormal results are displayed)  Labs Reviewed  CBC - Abnormal; Notable for the following components:      Result Value   WBC 1.9 (*)    RBC 3.08 (*)    Hemoglobin 9.9 (*)    HCT 32.4 (*)    MCV 105.2 (*)    RDW 15.7 (*)    All other components within normal limits  BASIC METABOLIC PANEL - Abnormal; Notable for the following components:   Glucose, Bld 119 (*)    Creatinine, Ser 1.32 (*)    GFR calc non Af Amer 42 (*)    GFR calc Af Amer 49 (*)    All other components within normal limits  HEMOGLOBIN A1C - Abnormal; Notable for the following components:   Hgb A1c MFr Bld 6.0 (*)    All other components within normal limits     IMAGES:   EKG: 11/03/17 Rate 97 bpm Sinus  rhythm Atrial premature complex RBBB and LAFB Left ventricular hypertrophy ST elevation, consider inferior injury   CV: Echo 04/05/17  LV EF 55% Normal left ventricular systolic function Elevated LA pressures with diastolic dysfunction Normal right ventricular systolic function  Valvular regurgitation: mild MR, trivial PR, mild TR No valvular stenosis Small pericardial effusion   Past Medical History:  Diagnosis Date  . Anxiety   . Back pain    occasionally  . Breast cancer ([place]) 2009   left  . Depression    takes Paxil and Wellbutrin daily  . Diabetes ([place])    takes Metformin daily   type 2   . Genetic testing 02/03/2017   Multi-Cancer panel (83 genes) @ [facility] - No pathogenic mutations detected  . GERD (gastroesophageal reflux disease)    occasional  . History of bronchitis 2 yrs ago  . History of kidney stones   . Hyperlipidemia    takes Atorvastatin daily  . Hypertension    no meds  . Insomnia    takes Ambien nightly  . Insomnia    takes gabapentin nightly  . Joint pain   . Mood swings   .  Osteoarthritis of knee   . Personal history of chemotherapy 12/05/2016   Mets from Breast Cancer  . Personal history of radiation therapy 11/2016  . Pneumonia   . PONV (postoperative nausea and vomiting)   . Seasonal allergies    takes Allegra daily    Past Surgical History:  Procedure Laterality Date  . BREAST BIOPSY  2009  . cataract surgery Bilateral   . COLONOSCOPY    . IR RADIOLOGIST EVAL & MGMT  01/29/2018  . JOINT REPLACEMENT Left 2014   knee  . KNEE ARTHROSCOPY Left    x 5  . MASTECTOMY Left   . port a cath placed    . PORTACATH PLACEMENT N/A 09/17/2015   Procedure: INSERTION PORT-A-CATH;  Surgeon: Jules Husbands, MD;  Location: [facility];  Service: General;  Laterality: N/A;  . TOOTH EXTRACTION    . TOTAL SHOULDER ARTHROPLASTY Right 06/03/2015   Procedure: TOTAL SHOULDER ARTHROPLASTY;  Surgeon: Tania Ade, MD;  Location: [place];  Service: Orthopedics;   Laterality: Right;  Right total shoulder arthroplasty  . TOTAL SHOULDER REPLACEMENT Right 06/03/2015  . TUBAL LIGATION      MEDICATIONS: . acetaminophen (TYLENOL) 500 MG tablet  . atorvastatin (LIPITOR) 10 MG tablet  . buPROPion (WELLBUTRIN XL) 150 MG 24 hr tablet  . Calcium-Magnesium-Vitamin D (CALCIUM 1200+D3 PO)  . Cyanocobalamin 5000 MCG CAPS  . dexamethasone (DECADRON) 4 MG tablet  . ferrous sulfate 325 (65 FE) MG EC tablet  . fexofenadine (ALLEGRA) 180 MG tablet  . gabapentin (NEURONTIN) 300 MG capsule  . IBRANCE 75 MG capsule  . letrozole (FEMARA) 2.5 MG tablet  . lidocaine-prilocaine (EMLA) cream  . metFORMIN (GLUCOPHAGE) 850 MG tablet  . pantoprazole (PROTONIX) 40 MG tablet  . PARoxetine (PAXIL) 40 MG tablet  . prochlorperazine (COMPAZINE) 10 MG tablet  . sodium chloride (MURO 128) 5 % ophthalmic solution  . zolpidem (AMBIEN) 10 MG tablet   No current facility-administered medications for this encounter.      Konrad Felix, PA-C [facility] [phone] 02/18/18 1:21 PM

## 2018-02-19 ENCOUNTER — Other Ambulatory Visit: Payer: Self-pay | Admitting: Radiology

## 2018-02-19 ENCOUNTER — Encounter (HOSPITAL_COMMUNITY): Payer: Self-pay | Admitting: Anesthesiology

## 2018-02-19 DIAGNOSIS — M25612 Stiffness of left shoulder, not elsewhere classified: Secondary | ICD-10-CM | POA: Diagnosis not present

## 2018-02-19 DIAGNOSIS — M7582 Other shoulder lesions, left shoulder: Secondary | ICD-10-CM | POA: Diagnosis not present

## 2018-02-19 DIAGNOSIS — M25512 Pain in left shoulder: Secondary | ICD-10-CM | POA: Diagnosis not present

## 2018-02-20 ENCOUNTER — Encounter (HOSPITAL_COMMUNITY)
Admission: RE | Disposition: A | Discharge status: Home / Self Care | Payer: Self-pay | Source: Home / Self Care | Attending: Interventional Radiology

## 2018-02-20 ENCOUNTER — Other Ambulatory Visit: Payer: Medicare Other

## 2018-02-20 ENCOUNTER — Ambulatory Visit: Payer: Medicare Other

## 2018-02-20 ENCOUNTER — Ambulatory Visit: Payer: Medicare Other | Admitting: Oncology

## 2018-02-20 ENCOUNTER — Ambulatory Visit (HOSPITAL_COMMUNITY): Payer: Medicare Other | Admitting: Certified Registered"

## 2018-02-20 ENCOUNTER — Ambulatory Visit (HOSPITAL_COMMUNITY)
Admission: RE | Admit: 2018-02-20 | Discharge: 2018-02-20 | Disposition: A | Discharge status: Home / Self Care | Payer: Medicare Other | Source: Ambulatory Visit | Attending: Interventional Radiology | Admitting: Interventional Radiology

## 2018-02-20 ENCOUNTER — Encounter (HOSPITAL_COMMUNITY): Payer: Self-pay | Admitting: General Practice

## 2018-02-20 ENCOUNTER — Ambulatory Visit (HOSPITAL_COMMUNITY): Payer: Medicare Other | Admitting: Physician Assistant

## 2018-02-20 ENCOUNTER — Ambulatory Visit (HOSPITAL_COMMUNITY)
Admission: RE | Admit: 2018-02-20 | Discharge: 2018-02-20 | Disposition: A | Discharge status: Home / Self Care | Payer: Medicare Other | Attending: Interventional Radiology | Admitting: Interventional Radiology

## 2018-02-20 DIAGNOSIS — K219 Gastro-esophageal reflux disease without esophagitis: Secondary | ICD-10-CM | POA: Insufficient documentation

## 2018-02-20 DIAGNOSIS — E785 Hyperlipidemia, unspecified: Secondary | ICD-10-CM | POA: Diagnosis not present

## 2018-02-20 DIAGNOSIS — C50919 Malignant neoplasm of unspecified site of unspecified female breast: Secondary | ICD-10-CM | POA: Diagnosis not present

## 2018-02-20 DIAGNOSIS — I10 Essential (primary) hypertension: Secondary | ICD-10-CM | POA: Insufficient documentation

## 2018-02-20 DIAGNOSIS — F419 Anxiety disorder, unspecified: Secondary | ICD-10-CM | POA: Insufficient documentation

## 2018-02-20 DIAGNOSIS — C787 Secondary malignant neoplasm of liver and intrahepatic bile duct: Secondary | ICD-10-CM

## 2018-02-20 DIAGNOSIS — Z7984 Long term (current) use of oral hypoglycemic drugs: Secondary | ICD-10-CM | POA: Insufficient documentation

## 2018-02-20 DIAGNOSIS — Z79899 Other long term (current) drug therapy: Secondary | ICD-10-CM | POA: Insufficient documentation

## 2018-02-20 DIAGNOSIS — C50912 Malignant neoplasm of unspecified site of left female breast: Secondary | ICD-10-CM | POA: Diagnosis not present

## 2018-02-20 DIAGNOSIS — G4700 Insomnia, unspecified: Secondary | ICD-10-CM | POA: Diagnosis not present

## 2018-02-20 DIAGNOSIS — Z885 Allergy status to narcotic agent status: Secondary | ICD-10-CM | POA: Insufficient documentation

## 2018-02-20 DIAGNOSIS — C779 Secondary and unspecified malignant neoplasm of lymph node, unspecified: Secondary | ICD-10-CM | POA: Diagnosis not present

## 2018-02-20 DIAGNOSIS — N200 Calculus of kidney: Secondary | ICD-10-CM | POA: Diagnosis not present

## 2018-02-20 DIAGNOSIS — F329 Major depressive disorder, single episode, unspecified: Secondary | ICD-10-CM | POA: Diagnosis not present

## 2018-02-20 DIAGNOSIS — Z880 Allergy status to penicillin: Secondary | ICD-10-CM | POA: Insufficient documentation

## 2018-02-20 DIAGNOSIS — Z87442 Personal history of urinary calculi: Secondary | ICD-10-CM | POA: Diagnosis not present

## 2018-02-20 DIAGNOSIS — Z539 Procedure and treatment not carried out, unspecified reason: Secondary | ICD-10-CM | POA: Diagnosis not present

## 2018-02-20 DIAGNOSIS — E119 Type 2 diabetes mellitus without complications: Secondary | ICD-10-CM | POA: Diagnosis not present

## 2018-02-20 HISTORY — PX: RADIOLOGY WITH ANESTHESIA: SHX6223

## 2018-02-20 LAB — COMPREHENSIVE METABOLIC PANEL
ALT: 38 U/L (ref 0–44)
AST: 124 U/L — ABNORMAL HIGH (ref 15–41)
Albumin: 3.4 g/dL — ABNORMAL LOW (ref 3.5–5.0)
Alkaline Phosphatase: 159 U/L — ABNORMAL HIGH (ref 38–126)
Anion gap: 10 (ref 5–15)
BUN: 18 mg/dL (ref 8–23)
CO2: 20 mmol/L — ABNORMAL LOW (ref 22–32)
Calcium: 9 mg/dL (ref 8.9–10.3)
Chloride: 109 mmol/L (ref 98–111)
Creatinine, Ser: 1.32 mg/dL — ABNORMAL HIGH (ref 0.44–1.00)
GFR calc Af Amer: 49 mL/min — ABNORMAL LOW (ref >60)
GFR calc non Af Amer: 42 mL/min — ABNORMAL LOW (ref >60)
Glucose, Bld: 111 mg/dL — ABNORMAL HIGH (ref 70–99)
Potassium: 4.4 mmol/L (ref 3.5–5.1)
SODIUM: 139 mmol/L (ref 135–145)
Total Bilirubin: 0.4 mg/dL (ref 0.3–1.2)
Total Protein: 6.1 g/dL — ABNORMAL LOW (ref 6.5–8.1)

## 2018-02-20 LAB — TYPE AND SCREEN
ABO/RH(D): O POS
Antibody Screen: NEGATIVE

## 2018-02-20 LAB — CBC
HCT: 32.1 % — ABNORMAL LOW (ref 36.0–46.0)
Hemoglobin: 10 g/dL — ABNORMAL LOW (ref 12.0–15.0)
MCH: 33.8 pg (ref 26.0–34.0)
MCHC: 31.2 g/dL (ref 30.0–36.0)
MCV: 108.4 fL — ABNORMAL HIGH (ref 80.0–100.0)
Platelets: 239 10*3/uL (ref 150–400)
RBC: 2.96 MIL/uL — ABNORMAL LOW (ref 3.87–5.11)
RDW: 16.6 % — AB (ref 11.5–15.5)
WBC: 2.9 10*3/uL — ABNORMAL LOW (ref 4.0–10.5)
nRBC: 1 % — ABNORMAL HIGH (ref 0.0–0.2)

## 2018-02-20 LAB — ABO/RH: ABO/RH(D): O POS

## 2018-02-20 LAB — PROTIME-INR
INR: 0.91
Prothrombin Time: 12.1 seconds (ref 11.4–15.2)

## 2018-02-20 LAB — GLUCOSE, CAPILLARY
Glucose-Capillary: 115 mg/dL — ABNORMAL HIGH (ref 70–99)
Glucose-Capillary: 104 mg/dL — ABNORMAL HIGH (ref 70–99)

## 2018-02-20 LAB — APTT: APTT: 27 s (ref 24–36)

## 2018-02-20 SURGERY — CT WITH ANESTHESIA
Anesthesia: General

## 2018-02-20 MED ORDER — PHENYLEPHRINE HCL-NACL 10-0.9 MG/250ML-% IV SOLN
INTRAVENOUS | Status: AC
  Filled 2018-02-20: qty 250

## 2018-02-20 MED ORDER — SODIUM CHLORIDE (PF) 0.9 % IJ SOLN
INTRAMUSCULAR | Status: AC
  Filled 2018-02-20: qty 100

## 2018-02-20 MED ORDER — DEXAMETHASONE SODIUM PHOSPHATE 10 MG/ML IJ SOLN
INTRAMUSCULAR | Status: DC | PRN
  Administered 2018-02-20: 4 mg via INTRAVENOUS

## 2018-02-20 MED ORDER — FENTANYL CITRATE (PF) 250 MCG/5ML IJ SOLN
INTRAMUSCULAR | Status: AC
  Filled 2018-02-20: qty 5

## 2018-02-20 MED ORDER — LACTATED RINGERS IV SOLN
INTRAVENOUS | Status: DC
  Administered 2018-02-20: 08:00:00 via INTRAVENOUS

## 2018-02-20 MED ORDER — MEPERIDINE HCL 50 MG/ML IJ SOLN: 6.2500 mg | INTRAMUSCULAR | Status: DC | PRN

## 2018-02-20 MED ORDER — SUGAMMADEX SODIUM 200 MG/2ML IV SOLN
INTRAVENOUS | Status: DC | PRN
  Administered 2018-02-20: 200 mg via INTRAVENOUS

## 2018-02-20 MED ORDER — FENTANYL CITRATE (PF) 250 MCG/5ML IJ SOLN
INTRAMUSCULAR | Status: DC | PRN
  Administered 2018-02-20: 50 ug via INTRAVENOUS

## 2018-02-20 MED ORDER — MIDAZOLAM HCL 2 MG/2ML IJ SOLN
INTRAMUSCULAR | Status: AC
  Filled 2018-02-20: qty 2

## 2018-02-20 MED ORDER — FENTANYL CITRATE (PF) 100 MCG/2ML IJ SOLN: 25.0000 ug | INTRAMUSCULAR | Status: DC | PRN

## 2018-02-20 MED ORDER — LIDOCAINE 2% (20 MG/ML) 5 ML SYRINGE
INTRAMUSCULAR | Status: DC | PRN
  Administered 2018-02-20: 60 mg via INTRAVENOUS

## 2018-02-20 MED ORDER — METOCLOPRAMIDE HCL 5 MG/ML IJ SOLN: 10.0000 mg | Freq: Once | INTRAMUSCULAR | Status: DC | PRN

## 2018-02-20 MED ORDER — ROCURONIUM BROMIDE 10 MG/ML (PF) SYRINGE
PREFILLED_SYRINGE | INTRAVENOUS | Status: DC | PRN
  Administered 2018-02-20: 10 mg via INTRAVENOUS
  Administered 2018-02-20: 50 mg via INTRAVENOUS
  Administered 2018-02-20: 10 mg via INTRAVENOUS

## 2018-02-20 MED ORDER — ONDANSETRON HCL 4 MG/2ML IJ SOLN
INTRAMUSCULAR | Status: DC | PRN
  Administered 2018-02-20: 4 mg via INTRAVENOUS

## 2018-02-20 MED ORDER — PROPOFOL 10 MG/ML IV BOLUS
INTRAVENOUS | Status: DC | PRN
  Administered 2018-02-20: 100 mg via INTRAVENOUS

## 2018-02-20 MED ORDER — PHENYLEPHRINE 40 MCG/ML (10ML) SYRINGE FOR IV PUSH (FOR BLOOD PRESSURE SUPPORT)
PREFILLED_SYRINGE | INTRAVENOUS | Status: DC | PRN
  Administered 2018-02-20 (×2): 40 ug via INTRAVENOUS

## 2018-02-20 MED ORDER — VANCOMYCIN HCL IN DEXTROSE 1-5 GM/200ML-% IV SOLN
1000.0000 mg | Freq: Once | INTRAVENOUS | Status: AC
  Administered 2018-02-20: 1000 mg via INTRAVENOUS
  Filled 2018-02-20: qty 200

## 2018-02-20 MED ORDER — IOPAMIDOL (ISOVUE-370) INJECTION 76%
100.0000 mL | Freq: Once | INTRAVENOUS | Status: AC | PRN
  Administered 2018-02-20: 100 mL via INTRAVENOUS

## 2018-02-20 MED ORDER — MIDAZOLAM HCL 2 MG/2ML IJ SOLN
INTRAMUSCULAR | Status: DC | PRN
  Administered 2018-02-20: 2 mg via INTRAVENOUS

## 2018-02-20 MED ORDER — SODIUM CHLORIDE 0.9 % IV SOLN
INTRAVENOUS | Status: DC | PRN
  Administered 2018-02-20: 10 ug/min via INTRAVENOUS

## 2018-02-20 MED ORDER — IOPAMIDOL (ISOVUE-370) INJECTION 76%
INTRAVENOUS | Status: AC
  Filled 2018-02-20: qty 100

## 2018-02-20 NOTE — Anesthesia Procedure Notes (Signed)
Procedure Name: Intubation Date/Time: 02/20/2018 8:54 AM Performed by: Eben Burow, CRNA Pre-anesthesia Checklist: Patient identified, Emergency Drugs available, Suction available, Patient being monitored and Timeout performed Patient Re-evaluated:Patient Re-evaluated prior to induction Oxygen Delivery Method: Circle system utilized Preoxygenation: Pre-oxygenation with 100% oxygen Induction Type: IV induction Ventilation: Mask ventilation without difficulty Laryngoscope Size: Mac and 4 Grade View: Grade I Tube type: Oral Tube size: 7.0 mm Number of attempts: 1 Airway Equipment and Method: Stylet Placement Confirmation: ETT inserted through vocal cords under direct vision,  positive ETCO2 and breath sounds checked- equal and bilateral Secured at: 22 cm Tube secured with: Tape Dental Injury: Teeth and Oropharynx as per pre-operative assessment

## 2018-02-20 NOTE — Transfer of Care (Signed)
Immediate Anesthesia Transfer of Care Note  Patient: Amanda [name]  Procedure(s) Performed: CT WITH ANESTHESIA MICROWAVE THERMAL ABLATION-LIVER (N/A )  Patient Location: PACU  Anesthesia Type:General  Level of Consciousness: awake, alert  and oriented  Airway & Oxygen Therapy: Patient Spontanous Breathing and Patient connected to face mask oxygen  Post-op Assessment: Report to RN, VSS.  Post vital signs: Reviewed and stable  Last Vitals:  Vitals Value Taken Time  BP 138/84 02/20/2018 10:02 AM  Temp    Pulse 80 02/20/2018 10:03 AM  Resp 16 02/20/2018 10:03 AM  SpO2 100 % 02/20/2018 10:03 AM  Vitals shown include unvalidated device data.  Last Pain:  Vitals:   02/20/18 0742  PainSc: 7          Complications: No apparent anesthesia complications

## 2018-02-20 NOTE — Anesthesia Postprocedure Evaluation (Signed)
Anesthesia Post Note  Patient: Amanda [name]  Procedure(s) Performed: CT WITH ANESTHESIA MICROWAVE THERMAL ABLATION-LIVER (N/A )     Patient location during evaluation: PACU Anesthesia Type: General Level of consciousness: awake and alert and oriented Pain management: pain level controlled Vital Signs Assessment: post-procedure vital signs reviewed and stable Respiratory status: spontaneous breathing, nonlabored ventilation and respiratory function stable Cardiovascular status: blood pressure returned to baseline and stable Postop Assessment: no apparent nausea or vomiting Anesthetic complications: no    Last Vitals:  Vitals:   02/20/18 1115 02/20/18 1135  BP: (!) 143/73 136/84  Pulse: 79 89  Resp: 20 19  Temp: (!) 36.3 C 37.2 C  SpO2: 94% 94%    Last Pain:  Vitals:   02/20/18 1135  PainSc: 0-No pain                 Sullivan Blasing A.

## 2018-02-20 NOTE — Progress Notes (Signed)
Dr Erma Heritage spoke with pt and family prior to discharge.  No new orders

## 2018-02-20 NOTE — H&P (Signed)
Referring Physician(s): POEUMPNT,I  Supervising Physician: Jacqulynn Cadet  Patient Status:  [facility] TBA  Chief Complaint:  Metastatic breast cancer to liver  Subjective: Patient familiar to IR service from prior left neck mass biopsy on 08/30/2015.  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  She has an enlarging  approximately 2 cm right hepatic lobe lesion and was seen in consultation by Dr. Laurence Ferrari on 01/29/2018 to discuss treatment options.  She was deemed an appropriate candidate for CT-guided microwave ablation of the liver tumor and presents today for the procedure.  She currently denies fever, headache, chest pain, dyspnea, cough, abdominal pain, nausea, vomiting or bleeding.  She does have intermittent back pain.  Past Medical History:  Diagnosis Date  . Anxiety   . Back pain    occasionally  . Breast cancer ([place]) 2009   left  . Depression    takes Paxil and Wellbutrin daily  . Diabetes ([place])    takes Metformin daily   type 2   . Genetic testing 02/03/2017   Multi-Cancer panel (83 genes) @ [facility] - No pathogenic mutations detected  . GERD (gastroesophageal reflux disease)    occasional  . History of bronchitis 2 yrs ago  . History of kidney stones   . Hyperlipidemia    takes Atorvastatin daily  . Hypertension    no meds  . Insomnia    takes Ambien nightly  . Insomnia    takes gabapentin nightly  . Joint pain   . Mood swings   . Osteoarthritis of knee   . Personal history of chemotherapy 12/05/2016   Mets from Breast Cancer  . Personal history of radiation therapy 11/2016  . Pneumonia   . PONV (postoperative nausea and vomiting)   . Seasonal allergies    takes Allegra daily   Past Surgical History:  Procedure Laterality Date  . BREAST BIOPSY  2009  . cataract surgery Bilateral   . COLONOSCOPY    . IR RADIOLOGIST  EVAL & MGMT  01/29/2018  . JOINT REPLACEMENT Left 2014   knee  . KNEE ARTHROSCOPY Left    x 5  . MASTECTOMY Left   . port a cath placed    . PORTACATH PLACEMENT N/A 09/17/2015   Procedure: INSERTION PORT-A-CATH;  Surgeon: Jules Husbands, MD;  Location: [facility];  Service: General;  Laterality: N/A;  . TOOTH EXTRACTION    . TOTAL SHOULDER ARTHROPLASTY Right 06/03/2015   Procedure: TOTAL SHOULDER ARTHROPLASTY;  Surgeon: Tania Ade, MD;  Location: [place];  Service: Orthopedics;  Laterality: Right;  Right total shoulder arthroplasty  . TOTAL SHOULDER REPLACEMENT Right 06/03/2015  . TUBAL LIGATION        Allergies: Ace inhibitors; Latex; Morphine and related; and Penicillins  Medications: Prior to Admission medications   Medication Sig Start Date End Date Taking? Authorizing Provider  acetaminophen (TYLENOL) 500 MG tablet Take 500 mg by mouth every 6 (six) hours as needed for mild pain or moderate pain.    Yes [provider]  atorvastatin (LIPITOR) 10 MG tablet Take 10 mg by mouth at bedtime.    Yes [provider]  buPROPion (WELLBUTRIN XL) 150 MG 24 hr tablet Take 150 mg by mouth at bedtime.  08/08/13  Yes [provider]  Calcium-Magnesium-Vitamin D (CALCIUM 1200+D3 PO) Take 1 tablet by mouth daily.    Yes [provider]  Cyanocobalamin 5000 MCG CAPS Take 5,000 mcg by mouth daily.    Yes [provider]  ferrous sulfate 325 (65 FE) MG EC tablet Take 325 mg by mouth daily.    Yes [provider]  fexofenadine (ALLEGRA) 180 MG tablet Take 180 mg by mouth at bedtime.    Yes [provider]  gabapentin (NEURONTIN) 300 MG capsule Take 300-600 mg by mouth See admin instructions. Take 300 mg by mouth in the morning and 600 mg at night   Yes [provider]  IBRANCE 75 MG capsule TAKE 1 CAPSULE (75 MG TOTAL) BY MOUTH DAILY WITH BREAKFAST. TAKE FOR 14 DAYS, THEN 14 DAYS OFF. Patient taking differently: Take 75 mg by mouth  See admin instructions. Take 75 mg by mouth daily with breakfast for 14 days, then take 14 days off and repeat. 01/14/18  Yes Lloyd Huger, MD  letrozole [facility]) 2.5 MG tablet Take 1 tablet (2.5 mg total) by mouth daily. 07/04/17  Yes Lloyd Huger, MD  metFORMIN (GLUCOPHAGE) 850 MG tablet Take 850 mg by mouth 2 (two) times daily with a meal.    Yes [provider]  pantoprazole (PROTONIX) 40 MG tablet Take 40 mg by mouth at bedtime.    Yes [provider]  PARoxetine (PAXIL) 40 MG tablet Take 40 mg by mouth at bedtime.  08/08/13  Yes [provider]  prochlorperazine (COMPAZINE) 10 MG tablet Take 1 tablet (10 mg total) by mouth every 6 (six) hours as needed for nausea or vomiting. 09/03/17  Yes Lloyd Huger, MD  sodium chloride (MURO 128) 5 % ophthalmic solution Place 1 drop into both eyes 4 (four) times daily.   Yes [provider]  zolpidem (AMBIEN) 10 MG tablet Take 10 mg by mouth at bedtime as needed (sleep).    Yes [provider]  dexamethasone (DECADRON) 4 MG tablet Take 1 tablet (4 mg total) by mouth every other day. Patient not taking: Reported on 02/07/2018 10/24/17   Lloyd Huger, MD  lidocaine-prilocaine (EMLA) cream Apply 1 application topically as needed. Apply to port 1-2 hours prior to chemotherapy appointment. Cover with plastic wrap. Patient not taking: Reported on 02/07/2018 09/27/15   Lloyd Huger, MD     Vital Signs: Blood pressure 118/91, heart rate 71, temperature 98.2, respirations 16, O2 sat 98% room air Ht 5\' 3"  (1.6 m)   Wt 184 lb (83.5 kg)   BMI 32.59 kg/m   Physical Exam awake, alert.  Chest clear to auscultation bilaterally.  Clean, intact right chest wall Port-A-Cath.  Heart with regular rate and rhythm.  Abdomen soft, positive bowel sounds, nontender.  Trace lower extremity edema.  Imaging: No results found.  Labs:  CBC: Recent Labs    12/19/17 0838 01/23/18 0923 02/13/18 1053  02/20/18 0720  WBC 2.7* 3.8* 1.9* 2.9*  HGB 9.1* 10.0* 9.9* 10.0*  HCT 29.1* 32.8* 32.4* 32.1*  PLT 220 320 196 239    COAGS: Recent Labs    02/20/18 0720  INR 0.91  APTT 27    BMP: Recent Labs    12/19/17 0838 01/23/18 0923 02/13/18 1053 02/20/18 0720  NA 140 140 139 139  K 4.1 4.5 4.8 4.4  CL 109 109 108 109  CO2 24 22 22  20*  GLUCOSE 235* 198* 119* 111*  BUN 14 16 21 18   CALCIUM 8.4* 9.1 8.9 9.0  CREATININE 0.95 1.29* 1.32* 1.32*  GFRNONAA >60 43* 42* 42*  GFRAA >60 50*  49* 49*    LIVER FUNCTION TESTS: Recent Labs    11/28/17 1403 12/19/17 0838 01/23/18 0923 02/20/18 0720  BILITOT 0.3 0.5 0.6 0.4  AST 99* 57* 90* 124*  ALT 62* 23 29 38  ALKPHOS 84 73 112 159*  PROT 6.6 5.8* 6.2* 6.1*  ALBUMIN 3.8 3.2* 3.7 3.4*    Assessment and Plan: 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  She has an enlarging  approximately 2 cm right hepatic lobe lesion and was seen in consultation by Dr. Laurence Ferrari on 01/29/2018 to discuss treatment options.  She was deemed an appropriate candidate for CT-guided microwave ablation of the liver tumor and presents today for the procedure.  Details/risks of procedure, including but not limited to, internal bleeding, infection, injury to adjacent structures, anesthesia related complications discussed with patient and family with their understanding and consent.  Post procedure she will be admitted for overnight observation.   Electronically Signed: D. Rowe Robert, PA-C 02/20/2018, 8:18 AM   I spent a total of 30 minutes at the the patient's bedside AND on the patient's hospital floor or unit, greater than 50% of which was counseling/coordinating care for CT-guided microwave ablation of liver tumor

## 2018-02-21 ENCOUNTER — Inpatient Hospital Stay: Payer: Medicare Other | Attending: Oncology | Admitting: Oncology

## 2018-02-21 ENCOUNTER — Other Ambulatory Visit: Payer: Self-pay

## 2018-02-21 ENCOUNTER — Encounter (HOSPITAL_COMMUNITY): Payer: Self-pay | Admitting: Interventional Radiology

## 2018-02-21 VITALS — BP 121/76 | HR 96 | Temp 97.6°F | Ht 63.0 in | Wt 189.2 lb

## 2018-02-21 DIAGNOSIS — C770 Secondary and unspecified malignant neoplasm of lymph nodes of head, face and neck: Secondary | ICD-10-CM

## 2018-02-21 DIAGNOSIS — R531 Weakness: Secondary | ICD-10-CM | POA: Insufficient documentation

## 2018-02-21 DIAGNOSIS — D709 Neutropenia, unspecified: Secondary | ICD-10-CM | POA: Diagnosis not present

## 2018-02-21 DIAGNOSIS — M8580 Other specified disorders of bone density and structure, unspecified site: Secondary | ICD-10-CM | POA: Diagnosis not present

## 2018-02-21 DIAGNOSIS — R5383 Other fatigue: Secondary | ICD-10-CM | POA: Diagnosis not present

## 2018-02-21 DIAGNOSIS — C7951 Secondary malignant neoplasm of bone: Secondary | ICD-10-CM | POA: Insufficient documentation

## 2018-02-21 DIAGNOSIS — G620 Drug-induced polyneuropathy: Secondary | ICD-10-CM

## 2018-02-21 DIAGNOSIS — C50912 Malignant neoplasm of unspecified site of left female breast: Secondary | ICD-10-CM | POA: Insufficient documentation

## 2018-02-21 DIAGNOSIS — K769 Liver disease, unspecified: Secondary | ICD-10-CM | POA: Diagnosis not present

## 2018-02-21 DIAGNOSIS — D649 Anemia, unspecified: Secondary | ICD-10-CM | POA: Diagnosis not present

## 2018-02-21 DIAGNOSIS — M545 Low back pain: Secondary | ICD-10-CM | POA: Insufficient documentation

## 2018-02-21 DIAGNOSIS — R630 Anorexia: Secondary | ICD-10-CM | POA: Diagnosis not present

## 2018-02-21 DIAGNOSIS — Z515 Encounter for palliative care: Secondary | ICD-10-CM | POA: Diagnosis not present

## 2018-02-21 DIAGNOSIS — Z5111 Encounter for antineoplastic chemotherapy: Secondary | ICD-10-CM | POA: Insufficient documentation

## 2018-02-21 DIAGNOSIS — C787 Secondary malignant neoplasm of liver and intrahepatic bile duct: Secondary | ICD-10-CM | POA: Insufficient documentation

## 2018-02-21 DIAGNOSIS — Z170 Estrogen receptor positive status [ER+]: Secondary | ICD-10-CM | POA: Diagnosis not present

## 2018-02-21 DIAGNOSIS — R7989 Other specified abnormal findings of blood chemistry: Secondary | ICD-10-CM | POA: Diagnosis not present

## 2018-02-21 DIAGNOSIS — I82C12 Acute embolism and thrombosis of left internal jugular vein: Secondary | ICD-10-CM | POA: Insufficient documentation

## 2018-02-21 DIAGNOSIS — C50919 Malignant neoplasm of unspecified site of unspecified female breast: Secondary | ICD-10-CM

## 2018-02-21 MED ORDER — PROCHLORPERAZINE MALEATE 10 MG PO TABS: 10.0000 mg | ORAL_TABLET | Freq: Four times a day (QID) | ORAL | 3 refills | Status: DC | PRN

## 2018-02-21 NOTE — Progress Notes (Signed)
Patient is here today to follow up on her malignant neoplasm of left breast in female. Patient is here to go over CT results. Patient would like to get a refill on her Compazine.

## 2018-02-22 ENCOUNTER — Ambulatory Visit
Admission: RE | Admit: 2018-02-22 | Discharge: 2018-02-22 | Disposition: A | Discharge status: Home / Self Care | Payer: Medicare Other | Source: Ambulatory Visit | Attending: Oncology | Admitting: Oncology

## 2018-02-22 DIAGNOSIS — K7689 Other specified diseases of liver: Secondary | ICD-10-CM | POA: Diagnosis not present

## 2018-02-22 DIAGNOSIS — C50919 Malignant neoplasm of unspecified site of unspecified female breast: Secondary | ICD-10-CM | POA: Diagnosis not present

## 2018-02-22 MED ORDER — GADOBUTROL 1 MMOL/ML IV SOLN
8.0000 mL | Freq: Once | INTRAVENOUS | Status: AC | PRN
  Administered 2018-02-22: 8 mL via INTRAVENOUS

## 2018-02-22 NOTE — Progress Notes (Signed)
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  INTERVAL HISTORY: Patient returns to clinic today for discussion of her MRI results and treatment planning.  She has right flank pain, but otherwise feels well.  She continues to be anxious.  She has a chronic peripheral neuropathy, but no other neurologic complaints.  She denies any recent fevers or illnesses.  She denies any chest pain, cough, hemoptysis, or shortness of breath.  Her appetite has significantly improved.  She denies any nausea, vomiting, constipation, or diarrhea. She has no urinary complaints.  Patient offers no further specific complaints today.  REVIEW OF SYSTEMS:   Review of Systems  Constitutional: Positive for malaise/fatigue. Negative for fever and weight loss.  Eyes: Negative.  Negative for blurred vision, double vision and pain.  Respiratory: Negative.  Negative for cough and shortness of breath.   Cardiovascular: Negative.  Negative for chest pain and leg swelling.  Gastrointestinal: Negative.  Negative for abdominal pain.  Genitourinary: Positive for flank pain. Negative for dysuria.  Musculoskeletal: Negative for back pain and falls.  Skin: Negative.  Negative for rash.  Neurological: Positive for tingling, sensory change and weakness. Negative for focal weakness and headaches.  Endo/Heme/Allergies: Negative.   Psychiatric/Behavioral: Negative.  Negative for memory loss. The patient is not nervous/anxious.     As per HPI. Otherwise, a complete review of systems is negative.  PAST MEDICAL HISTORY: Past Medical History:  Diagnosis Date  . Anxiety   . Back pain    occasionally  . Breast cancer ([place]) 2009   left  . Depression    takes Paxil and Wellbutrin daily  . Diabetes ([place])    takes Metformin daily   type 2   . Genetic testing 02/03/2017   Multi-Cancer panel (83 genes) @ [facility] - No pathogenic mutations detected  . GERD (gastroesophageal reflux disease)    occasional  . History of bronchitis 2 yrs ago  . History of kidney stones   . Hyperlipidemia    takes Atorvastatin daily  . Hypertension    no meds  . Insomnia    takes Ambien nightly  . Insomnia    takes gabapentin nightly  . Joint pain   . Mood swings   . Osteoarthritis of knee   . Personal history of chemotherapy 12/05/2016   Mets from Breast Cancer  . Personal history of radiation therapy 11/2016  . Pneumonia   . PONV (postoperative nausea and vomiting)   . Seasonal allergies    takes Allegra daily    PAST SURGICAL HISTORY: Past Surgical History:  Procedure Laterality Date  . BREAST BIOPSY  2009  . cataract surgery Bilateral   . COLONOSCOPY    . IR RADIOLOGIST EVAL & MGMT  01/29/2018  . JOINT REPLACEMENT Left 2014   knee  . KNEE ARTHROSCOPY Left    x 5  . MASTECTOMY Left   . port a cath placed    . PORTACATH PLACEMENT N/A 09/17/2015   Procedure: INSERTION PORT-A-CATH;  Surgeon: Jules Husbands, MD;  Location: [facility];  Service: General;  Laterality: N/A;  . RADIOLOGY WITH ANESTHESIA N/A 02/20/2018   Procedure: CT WITH ANESTHESIA MICROWAVE THERMAL ABLATION-LIVER;  Surgeon: Jacqulynn Cadet, MD;  Location: [facility];  Service: Anesthesiology;  Laterality: N/A;  . TOOTH EXTRACTION    . TOTAL SHOULDER ARTHROPLASTY Right 06/03/2015   Procedure: TOTAL SHOULDER ARTHROPLASTY;  Surgeon: Tania Ade, MD;  Location: [facility];  Service: Orthopedics;  Laterality: Right;  Right total shoulder arthroplasty  . TOTAL SHOULDER REPLACEMENT Right 06/03/2015  . TUBAL LIGATION      FAMILY HISTORY: Father with non-Hodgkin's lymphoma, 2 paternal aunts with breast cancer.     ADVANCED  DIRECTIVES:    HEALTH MAINTENANCE: Social History   Tobacco Use  . Smoking status: Never Smoker  . Smokeless tobacco: Never Used  Substance Use Topics  . Alcohol use: Yes    Alcohol/week: 0.0 standard drinks    Comment: occasionally wine  . Drug use: Yes    Types: Marijuana    Comment: cannabis with no extra  low dose edibles  for neuropathy     Colonoscopy:  PAP:  Bone density:  Lipid panel:  Allergies  Allergen Reactions  . Ace Inhibitors Cough  . Latex Itching  . Morphine And Related Itching    Caused her to itch terribly. Would prefer if given to take with a benadryl  . Penicillins Rash    Has patient had a PCN reaction causing immediate rash, facial/tongue/throat swelling, SOB or lightheadedness with hypotension: No Has patient had a PCN reaction causing severe rash involving mucus membranes or skin necrosis: No Has patient had a PCN reaction that required hospitalization No Has patient had a PCN reaction occurring within the last 10 years: No If all of the above answers are "NO", then may proceed with Cephalosporin use.    Current Outpatient Medications  Medication Sig Dispense Refill  . acetaminophen (TYLENOL) 500 MG tablet Take 500 mg by mouth every 6 (six) hours as needed for mild pain or moderate pain.     Marland Kitchen atorvastatin (LIPITOR) 10 MG tablet Take 10 mg by mouth at bedtime.     Marland Kitchen buPROPion (WELLBUTRIN XL) 150 MG 24 hr tablet Take 150 mg by mouth at bedtime.     . Calcium-Magnesium-Vitamin D (CALCIUM 1200+D3 PO) Take 1 tablet by mouth daily.     . Cyanocobalamin 5000 MCG CAPS Take 5,000 mcg by mouth daily.     . ferrous sulfate 325 (65 FE) MG EC tablet Take 325 mg by mouth daily.     . fexofenadine (ALLEGRA) 180 MG tablet Take 180 mg by mouth at bedtime.     . gabapentin (NEURONTIN) 300 MG capsule Take 300-600 mg by mouth See admin instructions. Take 300 mg by mouth in the morning and 600 mg at night    . IBRANCE 75 MG capsule TAKE 1 CAPSULE (75 MG TOTAL) BY  MOUTH DAILY WITH BREAKFAST. TAKE FOR 14 DAYS, THEN 14 DAYS OFF. (Patient taking differently: Take 75 mg by mouth See admin instructions. Take 75 mg by mouth daily with breakfast for 14 days, then take 14 days off and repeat.) 14 capsule 5  . letrozole (FEMARA) 2.5 MG tablet Take 1 tablet (2.5 mg total) by mouth daily. 90 tablet 3  . lidocaine-prilocaine (EMLA) cream Apply 1 application topically as needed. Apply to port 1-2 hours prior to chemotherapy appointment. Cover with plastic wrap. 30 g 0  . metFORMIN (GLUCOPHAGE) 850 MG tablet Take 850 mg by mouth 2 (two) times daily with a meal.     . pantoprazole (PROTONIX) 40 MG tablet Take 40 mg by mouth at bedtime.     Marland Kitchen PARoxetine (PAXIL) 40  MG tablet Take 40 mg by mouth at bedtime.     . prochlorperazine (COMPAZINE) 10 MG tablet Take 1 tablet (10 mg total) by mouth every 6 (six) hours as needed for nausea or vomiting. 30 tablet 3  . sodium chloride (MURO 128) 5 % ophthalmic solution Place 1 drop into both eyes 4 (four) times daily.    Marland Kitchen zolpidem (AMBIEN) 10 MG tablet Take 10 mg by mouth at bedtime as needed (sleep).     . Oxycodone HCl 10 MG TABS Take 1 tablet (10 mg total) by mouth every 4 (four) hours as needed. Ok to take every 4-6 hours as needed. 90 tablet 0   No current facility-administered medications for this visit.     OBJECTIVE: Vitals:   02/25/18 1318  BP: 135/82  Pulse: 85  Resp: 20  Temp: (!) 96.8 F (36 C)     Body mass index is 33.48 kg/m.    ECOG FS:0 - Asymptomatic  General: Well-developed, well-nourished, no acute distress. Eyes: Pink conjunctiva, anicteric sclera. HEENT: Normocephalic, moist mucous membranes. Lungs: Clear to auscultation bilaterally. Heart: Regular rate and rhythm. No rubs, murmurs, or gallops. Abdomen: Soft, nontender, nondistended. No organomegaly noted, normoactive bowel sounds. Musculoskeletal: No edema, cyanosis, or clubbing. Neuro: Alert, answering all questions appropriately. Cranial nerves  grossly intact. Skin: No rashes or petechiae noted. Psych: Normal affect.  LAB RESULTS:  Lab Results  Component Value Date   NA 139 02/20/2018   K 4.4 02/20/2018   CL 109 02/20/2018   CO2 20 (L) 02/20/2018   GLUCOSE 111 (H) 02/20/2018   BUN 18 02/20/2018   CREATININE 1.32 (H) 02/20/2018   CALCIUM 9.0 02/20/2018   PROT 6.1 (L) 02/20/2018   ALBUMIN 3.4 (L) 02/20/2018   AST 124 (H) 02/20/2018   ALT 38 02/20/2018   ALKPHOS 159 (H) 02/20/2018   BILITOT 0.4 02/20/2018   GFRNONAA 42 (L) 02/20/2018   GFRAA 49 (L) 02/20/2018    Lab Results  Component Value Date   WBC 2.9 (L) 02/20/2018   NEUTROABS 2.1 01/23/2018   HGB 10.0 (L) 02/20/2018   HCT 32.1 (L) 02/20/2018   MCV 108.4 (H) 02/20/2018   PLT 239 02/20/2018     STUDIES: Mr Liver W Wo Contrast  Result Date: 02/22/2018 CLINICAL DATA:  Metastatic breast cancer.  Followup liver lesions. EXAM: MRI ABDOMEN WITHOUT AND WITH CONTRAST TECHNIQUE: Multiplanar multisequence MR imaging of the abdomen was performed both before and after the administration of intravenous contrast. CONTRAST:  8 cc Gadavist. COMPARISON:  CT AP 12/18/2017 FINDINGS: Lower chest: Small bilateral pleural effusions appear increased from previous exam. Hepatobiliary: There are innumerable liver lesions identified throughout both lobes of liver compatible with widespread hepatic metastasis. These lesions are too numerous to count and are seen thoughout both lobes. This demonstrates a significant interval progression when compared with 12/18/17. A few sample lesions as follows: -large confluent mass within the dome of liver involving segments 4 and segment 2 measures 11.6 by 4.1 by 2.6 cm. New from previous exam. -the previous index lesion within segment 5 measures 4.0 by 3.9 cm, image 20/9. Previously 2.0 x 1.1 cm. -new lesion along the dome of segment 8 measures 1.8 x 1.8 cm, image 6/9. -new lesion within segment 6 measures 1.9 x 1.9 cm, image 28/9. Mild edema involving the  wall of gallbladder.  Nonspecific. Pancreas: No mass, inflammatory changes, or other parenchymal abnormality identified. Spleen:  Within normal limits in size and appearance. Adrenals/Urinary Tract: Normal appearance of the  adrenal glands. There is no kidney mass or hydronephrosis identified bilaterally. Mild bilateral renal cortical thinning identified. Stomach/Bowel: Visualized portions within the abdomen are unremarkable. Vascular/Lymphatic: Aortic atherosclerosis. No aneurysm. The portal vein remains patent. No significant adenopathy Other:  Small volume of perihepatic ascites is identified. Musculoskeletal: Multifocal bone metastases are identified. IMPRESSION: 1. Marked interval progression of liver metastases. 2. Increase in volume of pleural effusions. 3. Perihepatic ascites 4. Diffuse bone metastasis Electronically Signed   By: Kerby Moors M.D.   On: 02/22/2018 09:46   Ct Abdomen W Wo Contrast  Result Date: 02/20/2018 CLINICAL DATA:  68 year old female with a history of stage IV metastatic breast cancer with a solitary liver metastasis. She presents today for CT-guided percutaneous thermal ablation. EXAM: CT ABDOMEN WITHOUT AND WITH CONTRAST TECHNIQUE: Multidetector CT imaging of the abdomen was performed following the standard protocol before and following the bolus administration of intravenous contrast. CONTRAST:  152m ISOVUE-370 IOPAMIDOL (ISOVUE-370) INJECTION 76% COMPARISON:  Prior CT scan of the abdomen and pelvis 12/18/2017 FINDINGS: Lower chest: Dependent atelectasis in both lower lobes with small bilateral pleural effusions. Mild cardiomegaly with a small pericardial effusion. The intracardiac blood pool is hypodense relative to the adjacent myocardium consistent with anemia. Hepatobiliary: The liver remains similar in size and configuration although there is now some slight nodularity of the hepatic contour which was not definitively appreciated on prior imaging. The liver is diffusely  heterogeneous on the precontrast images. Following administration of arterial contrast, there is an extremely heterogeneous perfusion pattern of the liver with multifocal areas of hypoenhancement, some of which is confluent in nature. The previously identified lesion in hepatic segment 5 has enlarged and now measures approximately 2.9 x 1.8 cm. On the portal venous phase imaging, the liver remains extremely heterogeneous in appearance. The known lesion in segment 5 remains similar in size. There are other masslike areas of relatively low attenuation which are more amorphous and difficult to measure discretely. The gallbladder is contracted. No biliary ductal dilatation. Pancreas: Unremarkable. No pancreatic ductal dilatation or surrounding inflammatory changes. Spleen: Normal in size without focal abnormality. Adrenals/Urinary Tract: Normal adrenal glands. No evidence of hydronephrosis or enhancing renal lesion. Parapelvic renal sinus cysts noted on the left. Nonobstructing punctate stone in the right lower pole collecting system, unchanged. Stomach/Bowel: No focal bowel wall thickening or evidence of obstruction. Vascular/Lymphatic: Calcifications present in the abdominal aorta. Other: No abdominal wall hernia or abnormality. Musculoskeletal: Multifocal sclerotic osseous lesions again noted consistent with known osseous metastatic disease. Similar appearance of left mastectomy surgical site with small residual seroma. No significant interval change. IMPRESSION: Unfortunately, there is been interval development of extreme heterogeneity of the hepatic parenchyma over the relatively short interval compared to the prior CT scan dated 12/18/2017. Correlation with intra procedural ultrasound imaging demonstrates multifocal hypoechoic solid masses throughout the liver several of which have a more infiltrative appearance. The overall imaging appearance is most consistent with significant interval progression of hepatic  metastatic disease much of which now has a diffusely infiltrative appearance. The only other non malignant possibility would be sudden interval development of extensive masslike fatty infiltration which would be atypical over such a short interval. If further imaging is clinically warranted, MRI of the abdomen with and without gadolinium contrast could confirm. Electronically Signed   By: HJacqulynn CadetM.D.   On: 02/20/2018 14:29   Ir Radiologist Eval & Mgmt  Result Date: 01/29/2018 Please refer to notes tab for details about interventional procedure. 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  PLAN:    1.  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  Patient wishes to continue with palliative treatment.  She has discontinued Ibrance and letrozole and will return to clinic on Wednesday as previously scheduled to initiate cycle 1, day 1 of [place].  Will also continue Zometa on odd-numbered cycles.   2.  Osteopenia: Patient's bone mineral density on January 26, 2016 reported a T score of -0.9 which is considered normal. Continue calcium and vitamin D.  Zometa as above.   3.  Anemia: Hemoglobin is decreased, but stable at 10.0. 4. Peripheral neuropathy: Chronic and unchanged.  Continue gabapentin as prescribed. Neuropathy managed by neurology.  5. Back pain: MRI results from May 04, 2017 did not reveal metastatic disease.  Continue symptomatic treatment.  6.  Left IJ clot: Ultrasound results reviewed independently.  Patient has been instructed to discontinue Eliquis. 7.  Leukopenia: Chronic and unchanged. 8.  Dental complaints: Patient does not complain of this today.  No intervention needed at this time.  Patient has been instructed if  she changes her mind about intervention or become symptomatic we will readdress the issue at that time. 9.  Memory complaints: MRI of the brain on December 21, 2017 confirmed widespread osseous metastatic disease, but no intracranial abnormality was noted.  I spent a total of 30 minutes face-to-face with the patient of which greater than 50% of the visit was spent in counseling and coordination of care as detailed above.   Patient expressed understanding and was in agreement with this plan. She also understands that She can call clinic at any time with any questions, concerns, or complaints.   Breast cancer   Staging form: Breast, AJCC 7th Edition     Pathologic stage from 08/11/2014: Stage IIIA (T0, N2a, cM0) - Signed by Lloyd Huger, MD on 08/11/2014   Lloyd Huger, MD   02/26/2018 6:38 AM

## 2018-02-22 NOTE — Progress Notes (Signed)
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 Now with biopsy-proven stage IV disease.  INTERVAL HISTORY: Patient returns to clinic today as an add-on after ultrasound prior to ablation procedure noticed progressive disease in her liver.  Ablation was aborted.  She is anxious, but otherwise feels well.  She does not complain of weakness or fatigue today.  She continues to have a chronic peripheral neuropathy, but no other neurologic complaints.  She denies any recent fevers or illnesses.  She denies any chest pain, cough, hemoptysis, or shortness of breath.  Her appetite has significantly improved.  She has no abdominal pain, but admits to mild back pain.  She denies any nausea, vomiting, constipation, or diarrhea. She has no urinary complaints.  Patient offers no further specific complaints today.  REVIEW OF SYSTEMS:   Review of Systems  Constitutional: Positive for malaise/fatigue. Negative for fever and weight loss.  Eyes: Negative.  Negative for blurred vision, double vision and pain.  Respiratory: Negative.  Negative for cough and shortness of breath.   Cardiovascular: Negative.  Negative for chest pain and leg swelling.  Gastrointestinal: Negative.  Negative for abdominal pain.  Genitourinary: Negative.  Negative for dysuria.  Musculoskeletal: Positive for back pain. Negative for falls.  Skin: Negative.  Negative for rash.  Neurological: Positive for tingling, sensory change and weakness. Negative for focal weakness and headaches.  Endo/Heme/Allergies: Negative.   Psychiatric/Behavioral: Negative.  Negative for memory loss. The patient  is not nervous/anxious.     As per HPI. Otherwise, a complete review of systems is negative.  PAST MEDICAL HISTORY: Past Medical History:  Diagnosis Date  . Anxiety   . Back pain    occasionally  . Breast cancer ([place]) 2009   left  . Depression    takes Paxil and Wellbutrin daily  . Diabetes ([place])    takes Metformin daily   type 2   . Genetic testing 02/03/2017   Multi-Cancer panel (83 genes) @ [facility] - No pathogenic mutations detected  . GERD (gastroesophageal reflux disease)    occasional  . History of bronchitis 2 yrs ago  . History of kidney stones   . Hyperlipidemia    takes Atorvastatin daily  . Hypertension    no meds  . Insomnia    takes Ambien nightly  . Insomnia    takes gabapentin nightly  . Joint pain   . Mood swings   . Osteoarthritis of knee   . Personal history of chemotherapy 12/05/2016   Mets from Breast Cancer  . Personal history of radiation therapy 11/2016  . Pneumonia   . PONV (postoperative nausea and vomiting)   . Seasonal allergies    takes Allegra daily    PAST SURGICAL HISTORY: Past Surgical History:  Procedure Laterality Date  . BREAST BIOPSY  2009  . cataract surgery Bilateral   . COLONOSCOPY    . IR RADIOLOGIST EVAL & MGMT  01/29/2018  . JOINT REPLACEMENT Left 2014   knee  . KNEE ARTHROSCOPY Left    x 5  . MASTECTOMY Left   . port a cath placed    . PORTACATH PLACEMENT N/A 09/17/2015   Procedure: INSERTION PORT-A-CATH;  Surgeon: Jules Husbands, MD;  Location: [facility];  Service: General;  Laterality: N/A;  . RADIOLOGY WITH ANESTHESIA N/A 02/20/2018   Procedure: CT WITH ANESTHESIA MICROWAVE THERMAL ABLATION-LIVER;  Surgeon: Jacqulynn Cadet, MD;  Location: [facility];  Service: Anesthesiology;  Laterality: N/A;  . TOOTH EXTRACTION    . TOTAL SHOULDER ARTHROPLASTY Right 06/03/2015   Procedure: TOTAL SHOULDER ARTHROPLASTY;  Surgeon: Tania Ade, MD;  Location: [place];  Service: Orthopedics;  Laterality: Right;  Right total shoulder  arthroplasty  . TOTAL SHOULDER REPLACEMENT Right 06/03/2015  . TUBAL LIGATION      FAMILY HISTORY: Father with non-Hodgkin's lymphoma, 2 paternal aunts with breast cancer.     ADVANCED DIRECTIVES:    HEALTH MAINTENANCE: Social History   Tobacco Use  . Smoking status: Never Smoker  . Smokeless tobacco: Never Used  Substance Use Topics  . Alcohol use: Yes    Alcohol/week: 0.0 standard drinks    Comment: occasionally wine  . Drug use: Yes    Types: Marijuana    Comment: cannabis with no extra  low dose edibles  for neuropathy     Colonoscopy:  PAP:  Bone density:  Lipid panel:  Allergies  Allergen Reactions  . Ace Inhibitors Cough  . Latex Itching  . Morphine And Related Itching    Caused her to itch terribly. Would prefer if given to take with a benadryl  . Penicillins Rash    Has patient had a PCN reaction causing immediate rash, facial/tongue/throat swelling, SOB or lightheadedness with hypotension: No Has patient had a PCN reaction causing severe rash involving mucus membranes or skin necrosis: No Has patient had a PCN reaction that required hospitalization No Has patient had a PCN reaction occurring within the last 10 years: No If all of the above answers are "NO", then may proceed with Cephalosporin use.    Current Outpatient Medications  Medication Sig Dispense Refill  . acetaminophen (TYLENOL) 500 MG tablet Take 500 mg by mouth every 6 (six) hours as needed for mild pain or moderate pain.     Marland Kitchen atorvastatin (LIPITOR) 10 MG tablet Take 10 mg by mouth at bedtime.     Marland Kitchen buPROPion (WELLBUTRIN XL) 150 MG 24 hr tablet Take 150 mg by mouth at bedtime.     . Calcium-Magnesium-Vitamin D (CALCIUM 1200+D3 PO) Take 1 tablet by mouth daily.     . Cyanocobalamin 5000 MCG CAPS Take 5,000 mcg by mouth daily.     Marland Kitchen dexamethasone (DECADRON) 4 MG tablet Take 1 tablet (4 mg total) by mouth every other day. 15 tablet 3  . ferrous sulfate 325 (65 FE) MG EC tablet Take 325 mg by  mouth daily.     . fexofenadine (ALLEGRA) 180 MG tablet Take 180 mg by mouth at bedtime.     . gabapentin (NEURONTIN) 300 MG capsule Take 300-600 mg by mouth See admin instructions. Take 300 mg by mouth in the morning and 600 mg at night    . IBRANCE 75 MG capsule TAKE 1 CAPSULE (75 MG TOTAL) BY MOUTH DAILY WITH BREAKFAST. TAKE FOR 14 DAYS, THEN 14 DAYS OFF. (Patient taking differently: Take 75 mg by mouth See admin instructions. Take 75 mg by mouth daily with breakfast for 14 days, then take 14 days off and repeat.) 14 capsule 5  . letrozole (FEMARA) 2.5 MG tablet Take 1 tablet (2.5 mg total) by mouth daily. 90 tablet 3  . lidocaine-prilocaine (EMLA) cream Apply 1 application topically as needed. Apply to port 1-2 hours prior to chemotherapy  appointment. Cover with plastic wrap. 30 g 0  . metFORMIN (GLUCOPHAGE) 850 MG tablet Take 850 mg by mouth 2 (two) times daily with a meal.     . pantoprazole (PROTONIX) 40 MG tablet Take 40 mg by mouth at bedtime.     Marland Kitchen PARoxetine (PAXIL) 40 MG tablet Take 40 mg by mouth at bedtime.     . prochlorperazine (COMPAZINE) 10 MG tablet Take 1 tablet (10 mg total) by mouth every 6 (six) hours as needed for nausea or vomiting. 30 tablet 3  . sodium chloride (MURO 128) 5 % ophthalmic solution Place 1 drop into both eyes 4 (four) times daily.    Marland Kitchen zolpidem (AMBIEN) 10 MG tablet Take 10 mg by mouth at bedtime as needed (sleep).      No current facility-administered medications for this visit.     OBJECTIVE: Vitals:   02/21/18 1412  BP: 121/76  Pulse: 96  Temp: 97.6 F (36.4 C)     Body mass index is 33.52 kg/m.    ECOG FS:0 - Asymptomatic  General: Well-developed, well-nourished, no acute distress. Eyes: Pink conjunctiva, anicteric sclera. HEENT: Normocephalic, moist mucous membranes. Lungs: Clear to auscultation bilaterally. Heart: Regular rate and rhythm. No rubs, murmurs, or gallops. Abdomen: Soft, nontender, nondistended. No organomegaly noted,  normoactive bowel sounds. Musculoskeletal: No edema, cyanosis, or clubbing. Neuro: Alert, answering all questions appropriately. Cranial nerves grossly intact. Skin: No rashes or petechiae noted. Psych: Normal affect.  LAB RESULTS:  Lab Results  Component Value Date   NA 139 02/20/2018   K 4.4 02/20/2018   CL 109 02/20/2018   CO2 20 (L) 02/20/2018   GLUCOSE 111 (H) 02/20/2018   BUN 18 02/20/2018   CREATININE 1.32 (H) 02/20/2018   CALCIUM 9.0 02/20/2018   PROT 6.1 (L) 02/20/2018   ALBUMIN 3.4 (L) 02/20/2018   AST 124 (H) 02/20/2018   ALT 38 02/20/2018   ALKPHOS 159 (H) 02/20/2018   BILITOT 0.4 02/20/2018   GFRNONAA 42 (L) 02/20/2018   GFRAA 49 (L) 02/20/2018    Lab Results  Component Value Date   WBC 2.9 (L) 02/20/2018   NEUTROABS 2.1 01/23/2018   HGB 10.0 (L) 02/20/2018   HCT 32.1 (L) 02/20/2018   MCV 108.4 (H) 02/20/2018   PLT 239 02/20/2018     STUDIES: Mr Liver W Wo Contrast  Result Date: 02/22/2018 CLINICAL DATA:  Metastatic breast cancer.  Followup liver lesions. EXAM: MRI ABDOMEN WITHOUT AND WITH CONTRAST TECHNIQUE: Multiplanar multisequence MR imaging of the abdomen was performed both before and after the administration of intravenous contrast. CONTRAST:  8 cc Gadavist. COMPARISON:  CT AP 12/18/2017 FINDINGS: Lower chest: Small bilateral pleural effusions appear increased from previous exam. Hepatobiliary: There are innumerable liver lesions identified throughout both lobes of liver compatible with widespread hepatic metastasis. These lesions are too numerous to count and are seen thoughout both lobes. This demonstrates a significant interval progression when compared with 12/18/17. A few sample lesions as follows: -large confluent mass within the dome of liver involving segments 4 and segment 2 measures 11.6 by 4.1 by 2.6 cm. New from previous exam. -the previous index lesion within segment 5 measures 4.0 by 3.9 cm, image 20/9. Previously 2.0 x 1.1 cm. -new lesion  along the dome of segment 8 measures 1.8 x 1.8 cm, image 6/9. -new lesion within segment 6 measures 1.9 x 1.9 cm, image 28/9. Mild edema involving the wall of gallbladder.  Nonspecific. Pancreas: No mass, inflammatory changes, or other parenchymal abnormality  identified. Spleen:  Within normal limits in size and appearance. Adrenals/Urinary Tract: Normal appearance of the adrenal glands. There is no kidney mass or hydronephrosis identified bilaterally. Mild bilateral renal cortical thinning identified. Stomach/Bowel: Visualized portions within the abdomen are unremarkable. Vascular/Lymphatic: Aortic atherosclerosis. No aneurysm. The portal vein remains patent. No significant adenopathy Other:  Small volume of perihepatic ascites is identified. Musculoskeletal: Multifocal bone metastases are identified. IMPRESSION: 1. Marked interval progression of liver metastases. 2. Increase in volume of pleural effusions. 3. Perihepatic ascites 4. Diffuse bone metastasis Electronically Signed   By: Kerby Moors M.D.   On: 02/22/2018 09:46   Ct Abdomen W Wo Contrast  Result Date: 02/20/2018 CLINICAL DATA:  68 year old female with a history of stage IV metastatic breast cancer with a solitary liver metastasis. She presents today for CT-guided percutaneous thermal ablation. EXAM: CT ABDOMEN WITHOUT AND WITH CONTRAST TECHNIQUE: Multidetector CT imaging of the abdomen was performed following the standard protocol before and following the bolus administration of intravenous contrast. CONTRAST:  157m ISOVUE-370 IOPAMIDOL (ISOVUE-370) INJECTION 76% COMPARISON:  Prior CT scan of the abdomen and pelvis 12/18/2017 FINDINGS: Lower chest: Dependent atelectasis in both lower lobes with small bilateral pleural effusions. Mild cardiomegaly with a small pericardial effusion. The intracardiac blood pool is hypodense relative to the adjacent myocardium consistent with anemia. Hepatobiliary: The liver remains similar in size and configuration  although there is now some slight nodularity of the hepatic contour which was not definitively appreciated on prior imaging. The liver is diffusely heterogeneous on the precontrast images. Following administration of arterial contrast, there is an extremely heterogeneous perfusion pattern of the liver with multifocal areas of hypoenhancement, some of which is confluent in nature. The previously identified lesion in hepatic segment 5 has enlarged and now measures approximately 2.9 x 1.8 cm. On the portal venous phase imaging, the liver remains extremely heterogeneous in appearance. The known lesion in segment 5 remains similar in size. There are other masslike areas of relatively low attenuation which are more amorphous and difficult to measure discretely. The gallbladder is contracted. No biliary ductal dilatation. Pancreas: Unremarkable. No pancreatic ductal dilatation or surrounding inflammatory changes. Spleen: Normal in size without focal abnormality. Adrenals/Urinary Tract: Normal adrenal glands. No evidence of hydronephrosis or enhancing renal lesion. Parapelvic renal sinus cysts noted on the left. Nonobstructing punctate stone in the right lower pole collecting system, unchanged. Stomach/Bowel: No focal bowel wall thickening or evidence of obstruction. Vascular/Lymphatic: Calcifications present in the abdominal aorta. Other: No abdominal wall hernia or abnormality. Musculoskeletal: Multifocal sclerotic osseous lesions again noted consistent with known osseous metastatic disease. Similar appearance of left mastectomy surgical site with small residual seroma. No significant interval change. IMPRESSION: Unfortunately, there is been interval development of extreme heterogeneity of the hepatic parenchyma over the relatively short interval compared to the prior CT scan dated 12/18/2017. Correlation with intra procedural ultrasound imaging demonstrates multifocal hypoechoic solid masses throughout the liver several  of which have a more infiltrative appearance. The overall imaging appearance is most consistent with significant interval progression of hepatic metastatic disease much of which now has a diffusely infiltrative appearance. The only other non malignant possibility would be sudden interval development of extensive masslike fatty infiltration which would be atypical over such a short interval. If further imaging is clinically warranted, MRI of the abdomen with and without gadolinium contrast could confirm. Electronically Signed   By: HJacqulynn CadetM.D.   On: 02/20/2018 14:29   Ir Radiologist Eval & Mgmt  Result Date: 01/29/2018  Please refer to notes tab for details about interventional procedure. 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 Now with biopsy proven stage IV disease with lymph node and bone metastasis.  PLAN:    1.  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  MRI of the abdomen from February 20, 2018 reviewed independently and reported as above confirming multifocal solid masses throughout the liver indicating significant progression of disease since CT scan dated December 18, 2017.  Patient had previously indicated that she may want to discontinue treatment altogether if she had progressive disease.  Discontinue Ibrance and letrozole.  Patient will return to clinic on Monday, February 25, 2018 to discuss her MRI results and treatment planning, possibly with [place], if desired.   2.  Osteopenia: Patient's bone mineral density on January 26, 2016 reported a T score of -0.9 which is considered normal. Continue calcium and vitamin D.  Zometa as above.   3.  Anemia: Hemoglobin is decreased,  but stable at 10.0. 4. Peripheral neuropathy: Chronic and unchanged.  Continue gabapentin as prescribed. Neuropathy managed by neurology.  5. Back pain: MRI results from May 04, 2017 did not reveal metastatic disease.  Continue symptomatic treatment.  6.  Left IJ clot: Ultrasound results reviewed independently.  Patient has been instructed to discontinue Eliquis. 7.  Leukopenia: Chronic and unchanged. 8.  Dental complaints: Patient does not complain of this today.  No intervention needed at this time.  Patient has been instructed if she changes her mind about intervention or become symptomatic we will readdress the issue at that time. 9.  Memory complaints: MRI of the brain on December 21, 2017 confirmed widespread osseous metastatic disease, but no intracranial abnormality was noted.  Patient expressed understanding and was in agreement with this plan. She also understands that She can call clinic at any time with any questions, concerns, or complaints.   Breast cancer   Staging form: Breast, AJCC 7th Edition     Pathologic stage from 08/11/2014: Stage IIIA (T0, N2a, cM0) - Signed by Lloyd Huger, MD on 08/11/2014   Lloyd Huger, MD   02/22/2018 10:19 AM

## 2018-02-25 ENCOUNTER — Inpatient Hospital Stay (HOSPITAL_BASED_OUTPATIENT_CLINIC_OR_DEPARTMENT_OTHER): Payer: Medicare Other | Admitting: Oncology

## 2018-02-25 ENCOUNTER — Inpatient Hospital Stay (HOSPITAL_BASED_OUTPATIENT_CLINIC_OR_DEPARTMENT_OTHER): Payer: Medicare Other | Admitting: Hospice and Palliative Medicine

## 2018-02-25 VITALS — BP 135/82 | HR 85 | Temp 96.8°F | Resp 20 | Wt 189.0 lb

## 2018-02-25 DIAGNOSIS — M8580 Other specified disorders of bone density and structure, unspecified site: Secondary | ICD-10-CM | POA: Diagnosis not present

## 2018-02-25 DIAGNOSIS — C7951 Secondary malignant neoplasm of bone: Secondary | ICD-10-CM

## 2018-02-25 DIAGNOSIS — R109 Unspecified abdominal pain: Secondary | ICD-10-CM

## 2018-02-25 DIAGNOSIS — G620 Drug-induced polyneuropathy: Secondary | ICD-10-CM | POA: Diagnosis not present

## 2018-02-25 DIAGNOSIS — Z515 Encounter for palliative care: Secondary | ICD-10-CM

## 2018-02-25 DIAGNOSIS — C50912 Malignant neoplasm of unspecified site of left female breast: Secondary | ICD-10-CM | POA: Diagnosis not present

## 2018-02-25 DIAGNOSIS — K769 Liver disease, unspecified: Secondary | ICD-10-CM | POA: Diagnosis not present

## 2018-02-25 DIAGNOSIS — Z5111 Encounter for antineoplastic chemotherapy: Secondary | ICD-10-CM | POA: Diagnosis not present

## 2018-02-25 DIAGNOSIS — D72819 Decreased white blood cell count, unspecified: Secondary | ICD-10-CM | POA: Diagnosis not present

## 2018-02-25 DIAGNOSIS — C787 Secondary malignant neoplasm of liver and intrahepatic bile duct: Secondary | ICD-10-CM

## 2018-02-25 DIAGNOSIS — R52 Pain, unspecified: Secondary | ICD-10-CM

## 2018-02-25 DIAGNOSIS — D649 Anemia, unspecified: Secondary | ICD-10-CM

## 2018-02-25 DIAGNOSIS — C50919 Malignant neoplasm of unspecified site of unspecified female breast: Secondary | ICD-10-CM

## 2018-02-25 DIAGNOSIS — Z170 Estrogen receptor positive status [ER+]: Secondary | ICD-10-CM | POA: Diagnosis not present

## 2018-02-25 DIAGNOSIS — I82C12 Acute embolism and thrombosis of left internal jugular vein: Secondary | ICD-10-CM | POA: Diagnosis not present

## 2018-02-25 MED ORDER — OXYCODONE HCL 10 MG PO TABS: 10.0000 mg | ORAL_TABLET | ORAL | 0 refills | Status: AC | PRN

## 2018-02-25 MED FILL — IBRANCE 75 MG CAPSULE: 75 | 28 days supply | Qty: 14 | Fill #1

## 2018-02-25 NOTE — Progress Notes (Signed)
START ON PATHWAY REGIMEN - Breast     A cycle is every 21 days:     Eribulin mesylate   **Always confirm dose/schedule in your pharmacy ordering system**  Patient Characteristics: Distant Metastases or Locoregional Recurrent Disease - Unresected or Locally Advanced Unresectable Disease Progressing after Neoadjuvant and Local Therapies, HER2 Negative/Unknown/Equivocal, ER Positive, Chemotherapy, Third Line and Beyond, Prior  Anthracycline or Anthracycline Contraindicated, Prior Taxane, AND No Prior Eribulin Therapeutic Status: Distant Metastases BRCA Mutation Status: Absent ER Status: Positive (+) HER2 Status: Negative (-) PR Status: Positive (+) Line of Therapy: Third Line and Beyond Intent of Therapy: Non-Curative / Palliative Intent, Discussed with Patient 

## 2018-02-25 NOTE — Progress Notes (Signed)
[place]  Telephone:([phone] [phone]   Name: Amanda [name] Date: 02/25/2018 MRN: [id number]  DOB: 1950/06/02  Patient Care Team: Sofie Hartigan, MD as PCP - General (Family Medicine) Dahlia Byes, Marjory Lies, MD as Consulting Physician (General Surgery)    REASON FOR CONSULTATION: Palliative Care consult requested for this 68 y.o. 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  She was found to have disease progression on Ibrance and letrozole.  Patient previously had verbalized a desire not to pursue further treatment in the event of disease progression.  However, she is decided to start [place].  Palliative care was consulted to help address goals and follow for support.  SOCIAL HISTORY:    Patient lives at home with her husband.  She has a son and stepson who are involved.  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  Her husband owns a "ozone" business.  ADVANCE DIRECTIVES:  Not on file  CODE STATUS:   PAST MEDICAL HISTORY: Past Medical History:  Diagnosis Date  . Anxiety   . Back pain    occasionally  . Breast cancer ([place]) 2009   left  . Depression    takes Paxil and Wellbutrin daily  . Diabetes ([place])    takes Metformin daily   type 2   . Genetic testing 02/03/2017   Multi-Cancer panel (83 genes) @ [facility] - No pathogenic mutations detected  . GERD (gastroesophageal reflux disease)    occasional  . History of bronchitis 2 yrs ago  . History of kidney stones   . Hyperlipidemia    takes Atorvastatin daily  . Hypertension    no meds  . Insomnia    takes Ambien nightly  . Insomnia    takes gabapentin nightly  . Joint pain   . Mood swings   . Osteoarthritis of knee   . Personal history of chemotherapy 12/05/2016   Mets from Breast Cancer  . Personal history of radiation therapy 11/2016  . Pneumonia   . PONV (postoperative nausea  and vomiting)   . Seasonal allergies    takes Allegra daily    PAST SURGICAL HISTORY:  Past Surgical History:  Procedure Laterality Date  . BREAST BIOPSY  2009  . cataract surgery Bilateral   . COLONOSCOPY    . IR RADIOLOGIST EVAL & MGMT  01/29/2018  . JOINT REPLACEMENT Left 2014   knee  . KNEE ARTHROSCOPY Left    x 5  . MASTECTOMY Left   . port a cath placed    . PORTACATH PLACEMENT N/A 09/17/2015   Procedure: INSERTION PORT-A-CATH;  Surgeon: Jules Husbands, MD;  Location: [facility];  Service: General;  Laterality: N/A;  . RADIOLOGY WITH ANESTHESIA N/A 02/20/2018   Procedure: CT WITH ANESTHESIA MICROWAVE THERMAL ABLATION-LIVER;  Surgeon: Jacqulynn Cadet, MD;  Location: [facility];  Service: Anesthesiology;  Laterality: N/A;  . TOOTH EXTRACTION    . TOTAL SHOULDER ARTHROPLASTY Right 06/03/2015   Procedure: TOTAL SHOULDER ARTHROPLASTY;  Surgeon: Tania Ade, MD;  Location: [facility];  Service: Orthopedics;  Laterality: Right;  Right total shoulder arthroplasty  . TOTAL SHOULDER REPLACEMENT Right 06/03/2015  . TUBAL LIGATION      HEMATOLOGY/ONCOLOGY HISTORY:    Cancer of left female breast  ([place])   07/27/2014 Initial Diagnosis    Cancer of left female breast [facility])     Metastatic breast cancer ([place])  09/24/2015 Initial Diagnosis    Metastatic breast cancer ([place])    02/27/2018 -  Chemotherapy    The patient had eriBULin mesylate (HALAVEN) 2.75 mg in sodium chloride 0.9 % 100 mL chemo infusion, 1.4 mg/m2 = 2.75 mg, Intravenous,  Once, 0 of 4 cycles  for chemotherapy treatment.      ALLERGIES:  is allergic to ace inhibitors; latex; morphine and related; and penicillins.  MEDICATIONS:  Current Outpatient Medications  Medication Sig Dispense Refill  . acetaminophen (TYLENOL) 500 MG tablet Take 500 mg by mouth every 6 (six) hours as needed for mild pain or moderate pain.     Marland Kitchen atorvastatin (LIPITOR) 10 MG tablet Take 10 mg by mouth at bedtime.     Marland Kitchen buPROPion (WELLBUTRIN XL) 150 MG 24  hr tablet Take 150 mg by mouth at bedtime.     . Calcium-Magnesium-Vitamin D (CALCIUM 1200+D3 PO) Take 1 tablet by mouth daily.     . Cyanocobalamin 5000 MCG CAPS Take 5,000 mcg by mouth daily.     . ferrous sulfate 325 (65 FE) MG EC tablet Take 325 mg by mouth daily.     . fexofenadine (ALLEGRA) 180 MG tablet Take 180 mg by mouth at bedtime.     . gabapentin (NEURONTIN) 300 MG capsule Take 300-600 mg by mouth See admin instructions. Take 300 mg by mouth in the morning and 600 mg at night    . IBRANCE 75 MG capsule TAKE 1 CAPSULE (75 MG TOTAL) BY MOUTH DAILY WITH BREAKFAST. TAKE FOR 14 DAYS, THEN 14 DAYS OFF. (Patient taking differently: Take 75 mg by mouth See admin instructions. Take 75 mg by mouth daily with breakfast for 14 days, then take 14 days off and repeat.) 14 capsule 5  . letrozole (FEMARA) 2.5 MG tablet Take 1 tablet (2.5 mg total) by mouth daily. 90 tablet 3  . lidocaine-prilocaine (EMLA) cream Apply 1 application topically as needed. Apply to port 1-2 hours prior to chemotherapy appointment. Cover with plastic wrap. 30 g 0  . metFORMIN (GLUCOPHAGE) 850 MG tablet Take 850 mg by mouth 2 (two) times daily with a meal.     . Oxycodone HCl 10 MG TABS Take 1 tablet (10 mg total) by mouth every 4 (four) hours as needed. Ok to take every 4-6 hours as needed. 90 tablet 0  . pantoprazole (PROTONIX) 40 MG tablet Take 40 mg by mouth at bedtime.     Marland Kitchen PARoxetine (PAXIL) 40 MG tablet Take 40 mg by mouth at bedtime.     . prochlorperazine (COMPAZINE) 10 MG tablet Take 1 tablet (10 mg total) by mouth every 6 (six) hours as needed for nausea or vomiting. 30 tablet 3  . sodium chloride (MURO 128) 5 % ophthalmic solution Place 1 drop into both eyes 4 (four) times daily.    Marland Kitchen zolpidem (AMBIEN) 10 MG tablet Take 10 mg by mouth at bedtime as needed (sleep).      No current facility-administered medications for this visit.     VITAL SIGNS: There were no vitals taken for this visit. There were no vitals  filed for this visit.  Estimated body mass index is 33.48 kg/m as calculated from the following:   Height as of 02/21/18: '5\' 3"'$  (1.6 m).   Weight as of an earlier encounter on 02/25/18: 189 lb (85.7 kg).  LABS: CBC:    Component Value Date/Time   WBC 2.9 (L) 02/20/2018 0720   HGB 10.0 (L) 02/20/2018 0720   HGB  13.2 12/20/2011 0927   HCT 32.1 (L) 02/20/2018 0720   HCT 40.6 12/20/2011 0927   PLT 239 02/20/2018 0720   PLT 235 12/20/2011 0927   MCV 108.4 (H) 02/20/2018 0720   MCV 96 12/20/2011 0927   NEUTROABS 2.1 01/23/2018 0923   NEUTROABS 1.7 12/20/2011 0927   LYMPHSABS 0.7 01/23/2018 0923   LYMPHSABS 1.4 12/20/2011 0927   MONOABS 0.7 01/23/2018 0923   MONOABS 0.4 12/20/2011 0927   EOSABS 0.3 01/23/2018 0923   EOSABS 0.4 12/20/2011 0927   BASOSABS 0.1 01/23/2018 0923   BASOSABS 0.0 12/20/2011 0927   Comprehensive Metabolic Panel:    Component Value Date/Time   NA 139 02/20/2018 0720   NA 139 12/20/2011 0927   K 4.4 02/20/2018 0720   K 4.1 12/20/2011 0927   CL 109 02/20/2018 0720   CL 102 12/20/2011 0927   CO2 20 (L) 02/20/2018 0720   CO2 26 12/20/2011 0927   BUN 18 02/20/2018 0720   BUN 19 (H) 12/20/2011 0927   CREATININE 1.32 (H) 02/20/2018 0720   CREATININE 1.15 12/20/2011 0927   GLUCOSE 111 (H) 02/20/2018 0720   GLUCOSE 251 (H) 12/20/2011 0927   CALCIUM 9.0 02/20/2018 0720   CALCIUM 8.6 12/20/2011 0927   AST 124 (H) 02/20/2018 0720   AST 49 (H) 12/20/2011 0927   ALT 38 02/20/2018 0720   ALT 57 12/20/2011 0927   ALKPHOS 159 (H) 02/20/2018 0720   ALKPHOS 61 12/20/2011 0927   BILITOT 0.4 02/20/2018 0720   BILITOT 0.4 12/20/2011 0927   PROT 6.1 (L) 02/20/2018 0720   PROT 6.8 12/20/2011 0927   ALBUMIN 3.4 (L) 02/20/2018 0720   ALBUMIN 3.6 12/20/2011 1505    RADIOGRAPHIC STUDIES: Mr Liver W Wo Contrast  Result Date: 02/22/2018 CLINICAL DATA:  Metastatic breast cancer.  Followup liver lesions. EXAM: MRI ABDOMEN WITHOUT AND WITH CONTRAST TECHNIQUE: Multiplanar  multisequence MR imaging of the abdomen was performed both before and after the administration of intravenous contrast. CONTRAST:  8 cc Gadavist. COMPARISON:  CT AP 12/18/2017 FINDINGS: Lower chest: Small bilateral pleural effusions appear increased from previous exam. Hepatobiliary: There are innumerable liver lesions identified throughout both lobes of liver compatible with widespread hepatic metastasis. These lesions are too numerous to count and are seen thoughout both lobes. This demonstrates a significant interval progression when compared with 12/18/17. A few sample lesions as follows: -large confluent mass within the dome of liver involving segments 4 and segment 2 measures 11.6 by 4.1 by 2.6 cm. New from previous exam. -the previous index lesion within segment 5 measures 4.0 by 3.9 cm, image 20/9. Previously 2.0 x 1.1 cm. -new lesion along the dome of segment 8 measures 1.8 x 1.8 cm, image 6/9. -new lesion within segment 6 measures 1.9 x 1.9 cm, image 28/9. Mild edema involving the wall of gallbladder.  Nonspecific. Pancreas: No mass, inflammatory changes, or other parenchymal abnormality identified. Spleen:  Within normal limits in size and appearance. Adrenals/Urinary Tract: Normal appearance of the adrenal glands. There is no kidney mass or hydronephrosis identified bilaterally. Mild bilateral renal cortical thinning identified. Stomach/Bowel: Visualized portions within the abdomen are unremarkable. Vascular/Lymphatic: Aortic atherosclerosis. No aneurysm. The portal vein remains patent. No significant adenopathy Other:  Small volume of perihepatic ascites is identified. Musculoskeletal: Multifocal bone metastases are identified. IMPRESSION: 1. Marked interval progression of liver metastases. 2. Increase in volume of pleural effusions. 3. Perihepatic ascites 4. Diffuse bone metastasis Electronically Signed   By: Queen Slough.D.  On: 02/22/2018 09:46   Ct Abdomen W Wo Contrast  Result Date:  02/20/2018 CLINICAL DATA:  68 year old female with a history of stage IV metastatic breast cancer with a solitary liver metastasis. She presents today for CT-guided percutaneous thermal ablation. EXAM: CT ABDOMEN WITHOUT AND WITH CONTRAST TECHNIQUE: Multidetector CT imaging of the abdomen was performed following the standard protocol before and following the bolus administration of intravenous contrast. CONTRAST:  133m ISOVUE-370 IOPAMIDOL (ISOVUE-370) INJECTION 76% COMPARISON:  Prior CT scan of the abdomen and pelvis 12/18/2017 FINDINGS: Lower chest: Dependent atelectasis in both lower lobes with small bilateral pleural effusions. Mild cardiomegaly with a small pericardial effusion. The intracardiac blood pool is hypodense relative to the adjacent myocardium consistent with anemia. Hepatobiliary: The liver remains similar in size and configuration although there is now some slight nodularity of the hepatic contour which was not definitively appreciated on prior imaging. The liver is diffusely heterogeneous on the precontrast images. Following administration of arterial contrast, there is an extremely heterogeneous perfusion pattern of the liver with multifocal areas of hypoenhancement, some of which is confluent in nature. The previously identified lesion in hepatic segment 5 has enlarged and now measures approximately 2.9 x 1.8 cm. On the portal venous phase imaging, the liver remains extremely heterogeneous in appearance. The known lesion in segment 5 remains similar in size. There are other masslike areas of relatively low attenuation which are more amorphous and difficult to measure discretely. The gallbladder is contracted. No biliary ductal dilatation. Pancreas: Unremarkable. No pancreatic ductal dilatation or surrounding inflammatory changes. Spleen: Normal in size without focal abnormality. Adrenals/Urinary Tract: Normal adrenal glands. No evidence of hydronephrosis or enhancing renal lesion. Parapelvic  renal sinus cysts noted on the left. Nonobstructing punctate stone in the right lower pole collecting system, unchanged. Stomach/Bowel: No focal bowel wall thickening or evidence of obstruction. Vascular/Lymphatic: Calcifications present in the abdominal aorta. Other: No abdominal wall hernia or abnormality. Musculoskeletal: Multifocal sclerotic osseous lesions again noted consistent with known osseous metastatic disease. Similar appearance of left mastectomy surgical site with small residual seroma. No significant interval change. IMPRESSION: Unfortunately, there is been interval development of extreme heterogeneity of the hepatic parenchyma over the relatively short interval compared to the prior CT scan dated 12/18/2017. Correlation with intra procedural ultrasound imaging demonstrates multifocal hypoechoic solid masses throughout the liver several of which have a more infiltrative appearance. The overall imaging appearance is most consistent with significant interval progression of hepatic metastatic disease much of which now has a diffusely infiltrative appearance. The only other non malignant possibility would be sudden interval development of extensive masslike fatty infiltration which would be atypical over such a short interval. If further imaging is clinically warranted, MRI of the abdomen with and without gadolinium contrast could confirm. Electronically Signed   By: HJacqulynn CadetM.D.   On: 02/20/2018 14:29   Ir Radiologist Eval & Mgmt  Result Date: 01/29/2018 Please refer to notes tab for details about interventional procedure. (Op Note)   PERFORMANCE STATUS (ECOG) : 1 - Symptomatic but completely ambulatory  Review of Systems As noted above. Otherwise, a complete review of systems is negative.  Physical Exam General: NAD, frail appearing Cardiovascular: regular rate and rhythm Pulmonary: clear ant fields, end expiratory wheezing Abdomen: soft, nontender, + bowel sounds GU: no  suprapubic tenderness Extremities: no edema, no joint deformities Skin: no rashes Neurological: Weakness but otherwise nonfocal  IMPRESSION: I met with patient and her friend today in the clinic.  I introduced palliative care services  and attempted to establish a therapeutic rapport.  Patient met today with Dr. Grayland Ormond.  She recognizes that she has had disease progression on treatment and confirms plan to rotate to a new chemotherapy.  Symptomatically, patient says she is doing reasonably well.  She has had persistent pain in the back.  Patient was taking Norco, which she had leftover from a previous surgery.  However, the Norco was not effective at managing her pain.  Dr. Grayland Ormond has started her on oxycodone.  Agree with this regimen.  Patient would not be a candidate for NSAIDs in the setting of renal dysfunction.  Steroids might be an option if oxycodone is not fully effective.  Patient says she is coping well with the news of her cancer.  She seems to have a bright affect and was joking with a friend.  She says she has a good support system.  Will need to address ACP during a future visit.  PLAN: -Continue current scope of treatment -Agree with oxycodone as needed for pain -Recommend senna as needed for constipation -Will need to address ACP during future visit -RTC in 1 month or sooner if needed   Patient expressed understanding and was in agreement with this plan. She also understands that She can call clinic at any time with any questions, concerns, or complaints.     Time Total: 30 minutes  Visit consisted of counseling and education dealing with the complex and emotionally intense issues of symptom management and palliative care in the setting of serious and potentially life-threatening illness.Greater than 50%  of this time was spent counseling and coordinating care related to the above assessment and plan.  Signed by: Altha Harm, PhD, NP-C [phone] (Work Cell)

## 2018-02-27 ENCOUNTER — Encounter: Payer: Self-pay | Admitting: Oncology

## 2018-02-27 ENCOUNTER — Inpatient Hospital Stay (HOSPITAL_BASED_OUTPATIENT_CLINIC_OR_DEPARTMENT_OTHER): Payer: Medicare Other | Admitting: Oncology

## 2018-02-27 ENCOUNTER — Inpatient Hospital Stay: Payer: Medicare Other

## 2018-02-27 ENCOUNTER — Inpatient Hospital Stay (HOSPITAL_BASED_OUTPATIENT_CLINIC_OR_DEPARTMENT_OTHER): Payer: Medicare Other | Admitting: Hospice and Palliative Medicine

## 2018-02-27 ENCOUNTER — Other Ambulatory Visit: Payer: Self-pay

## 2018-02-27 VITALS — BP 143/85 | HR 85 | Temp 97.7°F | Resp 18 | Wt 186.0 lb

## 2018-02-27 DIAGNOSIS — M545 Low back pain: Secondary | ICD-10-CM

## 2018-02-27 DIAGNOSIS — Z170 Estrogen receptor positive status [ER+]: Secondary | ICD-10-CM | POA: Diagnosis not present

## 2018-02-27 DIAGNOSIS — D649 Anemia, unspecified: Secondary | ICD-10-CM

## 2018-02-27 DIAGNOSIS — I82C12 Acute embolism and thrombosis of left internal jugular vein: Secondary | ICD-10-CM

## 2018-02-27 DIAGNOSIS — Z5111 Encounter for antineoplastic chemotherapy: Secondary | ICD-10-CM | POA: Diagnosis not present

## 2018-02-27 DIAGNOSIS — R52 Pain, unspecified: Secondary | ICD-10-CM | POA: Diagnosis not present

## 2018-02-27 DIAGNOSIS — M8580 Other specified disorders of bone density and structure, unspecified site: Secondary | ICD-10-CM

## 2018-02-27 DIAGNOSIS — C7951 Secondary malignant neoplasm of bone: Secondary | ICD-10-CM

## 2018-02-27 DIAGNOSIS — C787 Secondary malignant neoplasm of liver and intrahepatic bile duct: Secondary | ICD-10-CM | POA: Diagnosis not present

## 2018-02-27 DIAGNOSIS — Z515 Encounter for palliative care: Secondary | ICD-10-CM

## 2018-02-27 DIAGNOSIS — C50919 Malignant neoplasm of unspecified site of unspecified female breast: Secondary | ICD-10-CM | POA: Diagnosis not present

## 2018-02-27 DIAGNOSIS — G620 Drug-induced polyneuropathy: Secondary | ICD-10-CM | POA: Diagnosis not present

## 2018-02-27 DIAGNOSIS — C50912 Malignant neoplasm of unspecified site of left female breast: Secondary | ICD-10-CM

## 2018-02-27 DIAGNOSIS — G893 Neoplasm related pain (acute) (chronic): Secondary | ICD-10-CM

## 2018-02-27 DIAGNOSIS — D72819 Decreased white blood cell count, unspecified: Secondary | ICD-10-CM | POA: Diagnosis not present

## 2018-02-27 DIAGNOSIS — K769 Liver disease, unspecified: Secondary | ICD-10-CM | POA: Diagnosis not present

## 2018-02-27 LAB — COMPREHENSIVE METABOLIC PANEL
ALT: 32 U/L (ref 0–44)
ANION GAP: 6 (ref 5–15)
AST: 115 U/L — ABNORMAL HIGH (ref 15–41)
Albumin: 3 g/dL — ABNORMAL LOW (ref 3.5–5.0)
Alkaline Phosphatase: 181 U/L — ABNORMAL HIGH (ref 38–126)
BUN: 21 mg/dL (ref 8–23)
CO2: 24 mmol/L (ref 22–32)
Calcium: 8.5 mg/dL — ABNORMAL LOW (ref 8.9–10.3)
Chloride: 107 mmol/L (ref 98–111)
Creatinine, Ser: 1.21 mg/dL — ABNORMAL HIGH (ref 0.44–1.00)
GFR calc Af Amer: 54 mL/min — ABNORMAL LOW (ref >60)
GFR calc non Af Amer: 47 mL/min — ABNORMAL LOW (ref >60)
Glucose, Bld: 156 mg/dL — ABNORMAL HIGH (ref 70–99)
Potassium: 4.4 mmol/L (ref 3.5–5.1)
Sodium: 137 mmol/L (ref 135–145)
TOTAL PROTEIN: 5.6 g/dL — AB (ref 6.5–8.1)
Total Bilirubin: 0.6 mg/dL (ref 0.3–1.2)

## 2018-02-27 LAB — CBC WITH DIFFERENTIAL/PLATELET
Abs Immature Granulocytes: 0.15 10*3/uL — ABNORMAL HIGH (ref 0.00–0.07)
Basophils Absolute: 0.1 10*3/uL (ref 0.0–0.1)
Basophils Relative: 1 %
EOS ABS: 0.1 10*3/uL (ref 0.0–0.5)
Eosinophils Relative: 2 %
HCT: 28 % — ABNORMAL LOW (ref 36.0–46.0)
Hemoglobin: 8.6 g/dL — ABNORMAL LOW (ref 12.0–15.0)
Immature Granulocytes: 4 %
Lymphocytes Relative: 11 %
Lymphs Abs: 0.4 10*3/uL — ABNORMAL LOW (ref 0.7–4.0)
MCH: 31.7 pg (ref 26.0–34.0)
MCHC: 30.7 g/dL (ref 30.0–36.0)
MCV: 103.3 fL — ABNORMAL HIGH (ref 80.0–100.0)
Monocytes Absolute: 0.8 10*3/uL (ref 0.1–1.0)
Monocytes Relative: 22 %
Neutro Abs: 2.1 10*3/uL (ref 1.7–7.7)
Neutrophils Relative %: 60 %
Platelets: 263 10*3/uL (ref 150–400)
RBC: 2.71 MIL/uL — ABNORMAL LOW (ref 3.87–5.11)
RDW: 16.5 % — ABNORMAL HIGH (ref 11.5–15.5)
WBC: 3.5 10*3/uL — ABNORMAL LOW (ref 4.0–10.5)
nRBC: 1.1 % — ABNORMAL HIGH (ref 0.0–0.2)

## 2018-02-27 MED ORDER — HEPARIN SOD (PORK) LOCK FLUSH 100 UNIT/ML IV SOLN
500.0000 [IU] | Freq: Once | INTRAVENOUS | Status: AC
  Administered 2018-02-27: 500 [IU] via INTRAVENOUS
  Filled 2018-02-27: qty 5

## 2018-02-27 MED ORDER — SODIUM CHLORIDE 0.9 % IV SOLN
Freq: Once | INTRAVENOUS | Status: AC
  Administered 2018-02-27: 11:00:00 via INTRAVENOUS
  Filled 2018-02-27: qty 250

## 2018-02-27 MED ORDER — ZOLEDRONIC ACID 4 MG/100ML IV SOLN
4.0000 mg | Freq: Once | INTRAVENOUS | Status: AC
  Administered 2018-02-27: 4 mg via INTRAVENOUS
  Filled 2018-02-27: qty 100

## 2018-02-27 MED ORDER — SODIUM CHLORIDE 0.9 % IV SOLN
1.4000 mg/m2 | Freq: Once | INTRAVENOUS | Status: AC
  Administered 2018-02-27: 2.75 mg via INTRAVENOUS
  Filled 2018-02-27: qty 5.5

## 2018-02-27 MED ORDER — SODIUM CHLORIDE 0.9% FLUSH
10.0000 mL | INTRAVENOUS | Status: DC | PRN
  Administered 2018-02-27: 10 mL via INTRAVENOUS
  Filled 2018-02-27: qty 10

## 2018-02-27 MED ORDER — PROCHLORPERAZINE MALEATE 10 MG PO TABS
10.0000 mg | ORAL_TABLET | Freq: Once | ORAL | Status: AC
  Administered 2018-02-27: 10 mg via ORAL
  Filled 2018-02-27: qty 1

## 2018-02-27 NOTE — Progress Notes (Signed)
[place]  Telephone:([phone] [phone]   Name: Amanda [name] Date: 02/27/2018 MRN: [id number]  DOB: Dec 17, 1950  Patient Care Team: Sofie Hartigan, MD as PCP - General (Family Medicine) Dahlia Byes, Marjory Lies, MD as Consulting Physician (General Surgery)    REASON FOR CONSULTATION: Palliative Care consult requested for this 68 y.o. 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  She was found to have disease progression on Ibrance and letrozole.  Patient previously had verbalized a desire not to pursue further treatment in the event of disease progression.  However, she is decided to start [place].  Palliative care was consulted to help address goals and follow for support.  SOCIAL HISTORY:    Patient lives at home with her husband.  She has a son and stepson who are involved.  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  Her husband owns a "ozone" business.  ADVANCE DIRECTIVES:  Not on file  CODE STATUS:   PAST MEDICAL HISTORY: Past Medical History:  Diagnosis Date  . Anxiety   . Back pain    occasionally  . Breast cancer ([place]) 2009   left  . Depression    takes Paxil and Wellbutrin daily  . Diabetes ([place])    takes Metformin daily   type 2   . Genetic testing 02/03/2017   Multi-Cancer panel (83 genes) @ [facility] - No pathogenic mutations detected  . GERD (gastroesophageal reflux disease)    occasional  . History of bronchitis 2 yrs ago  . History of kidney stones   . Hyperlipidemia    takes Atorvastatin daily  . Hypertension    no meds  . Insomnia    takes Ambien nightly  . Insomnia    takes gabapentin nightly  . Joint pain   . Mood swings   . Osteoarthritis of knee   . Personal history of chemotherapy 12/05/2016   Mets from Breast Cancer  . Personal history of radiation therapy 11/2016  . Pneumonia   . PONV (postoperative nausea  and vomiting)   . Seasonal allergies    takes Allegra daily    PAST SURGICAL HISTORY:  Past Surgical History:  Procedure Laterality Date  . BREAST BIOPSY  2009  . cataract surgery Bilateral   . COLONOSCOPY    . IR RADIOLOGIST EVAL & MGMT  01/29/2018  . JOINT REPLACEMENT Left 2014   knee  . KNEE ARTHROSCOPY Left    x 5  . MASTECTOMY Left   . port a cath placed    . PORTACATH PLACEMENT N/A 09/17/2015   Procedure: INSERTION PORT-A-CATH;  Surgeon: Jules Husbands, MD;  Location: [facility] ORS;  Service: General;  Laterality: N/A;  . RADIOLOGY WITH ANESTHESIA N/A 02/20/2018   Procedure: CT WITH ANESTHESIA MICROWAVE THERMAL ABLATION-LIVER;  Surgeon: Jacqulynn Cadet, MD;  Location: [facility];  Service: Anesthesiology;  Laterality: N/A;  . TOOTH EXTRACTION    . TOTAL SHOULDER ARTHROPLASTY Right 06/03/2015   Procedure: TOTAL SHOULDER ARTHROPLASTY;  Surgeon: Tania Ade, MD;  Location: [place];  Service: Orthopedics;  Laterality: Right;  Right total shoulder arthroplasty  . TOTAL SHOULDER REPLACEMENT Right 06/03/2015  . TUBAL LIGATION      HEMATOLOGY/ONCOLOGY HISTORY:    Cancer of left female breast  ([place])   07/27/2014 Initial Diagnosis    Cancer of left female breast [facility])     Metastatic breast cancer ([employer])  09/24/2015 Initial Diagnosis    Metastatic breast cancer ([place])    02/27/2018 -  Chemotherapy    The patient had eriBULin mesylate (HALAVEN) 2.75 mg in sodium chloride 0.9 % 100 mL chemo infusion, 1.4 mg/m2 = 2.75 mg, Intravenous,  Once, 1 of 4 cycles  for chemotherapy treatment.      ALLERGIES:  is allergic to ace inhibitors; latex; morphine and related; and penicillins.  MEDICATIONS:  Current Outpatient Medications  Medication Sig Dispense Refill  . acetaminophen (TYLENOL) 500 MG tablet Take 500 mg by mouth every 6 (six) hours as needed for mild pain or moderate pain.     Marland Kitchen atorvastatin (LIPITOR) 10 MG tablet Take 10 mg by mouth at bedtime.     Marland Kitchen buPROPion (WELLBUTRIN XL) 150 MG 24  hr tablet Take 150 mg by mouth at bedtime.     . Calcium-Magnesium-Vitamin D (CALCIUM 1200+D3 PO) Take 1 tablet by mouth daily.     . Cyanocobalamin 5000 MCG CAPS Take 5,000 mcg by mouth daily.     . ferrous sulfate 325 (65 FE) MG EC tablet Take 325 mg by mouth daily.     . fexofenadine (ALLEGRA) 180 MG tablet Take 180 mg by mouth at bedtime.     . gabapentin (NEURONTIN) 300 MG capsule Take 300-600 mg by mouth See admin instructions. Take 300 mg by mouth in the morning and 600 mg at night    . lidocaine-prilocaine (EMLA) cream Apply 1 application topically as needed. Apply to port 1-2 hours prior to chemotherapy appointment. Cover with plastic wrap. 30 g 0  . metFORMIN (GLUCOPHAGE) 850 MG tablet Take 850 mg by mouth 2 (two) times daily with a meal.     . Oxycodone HCl 10 MG TABS Take 1 tablet (10 mg total) by mouth every 4 (four) hours as needed. Ok to take every 4-6 hours as needed. 90 tablet 0  . pantoprazole (PROTONIX) 40 MG tablet Take 40 mg by mouth at bedtime.     Marland Kitchen PARoxetine (PAXIL) 40 MG tablet Take 40 mg by mouth at bedtime.     . prochlorperazine (COMPAZINE) 10 MG tablet Take 1 tablet (10 mg total) by mouth every 6 (six) hours as needed for nausea or vomiting. 30 tablet 3  . sodium chloride (MURO 128) 5 % ophthalmic solution Place 1 drop into both eyes 4 (four) times daily.    Marland Kitchen zolpidem (AMBIEN) 10 MG tablet Take 10 mg by mouth at bedtime as needed (sleep).      No current facility-administered medications for this visit.    Facility-Administered Medications Ordered in Other Visits  Medication Dose Route Frequency Provider Last Rate Last Dose  . eriBULin mesylate (HALAVEN) 2.75 mg in sodium chloride 0.9 % 100 mL chemo infusion  1.4 mg/m2 (Treatment Plan Recorded) Intravenous Once Lloyd Huger, MD      . heparin lock flush 100 unit/mL  500 Units Intravenous Once Lloyd Huger, MD      . sodium chloride flush (NS) 0.9 % injection 10 mL  10 mL Intravenous PRN Lloyd Huger, MD   10 mL at 02/27/18 1005    VITAL SIGNS: There were no vitals taken for this visit. There were no vitals filed for this visit.  Estimated body mass index is 32.95 kg/m as calculated from the following:   Height as of 02/21/18: 5' 3"  (1.6 m).   Weight as of an earlier encounter on 02/27/18: 186 lb (84.4 kg).  LABS: CBC:  Component Value Date/Time   WBC 3.5 (L) 02/27/2018 0958   HGB 8.6 (L) 02/27/2018 0958   HGB 13.2 12/20/2011 0927   HCT 28.0 (L) 02/27/2018 0958   HCT 40.6 12/20/2011 0927   PLT 263 02/27/2018 0958   PLT 235 12/20/2011 0927   MCV 103.3 (H) 02/27/2018 0958   MCV 96 12/20/2011 0927   NEUTROABS 2.1 02/27/2018 0958   NEUTROABS 1.7 12/20/2011 0927   LYMPHSABS 0.4 (L) 02/27/2018 0958   LYMPHSABS 1.4 12/20/2011 0927   MONOABS 0.8 02/27/2018 0958   MONOABS 0.4 12/20/2011 0927   EOSABS 0.1 02/27/2018 0958   EOSABS 0.4 12/20/2011 0927   BASOSABS 0.1 02/27/2018 0958   BASOSABS 0.0 12/20/2011 0927   Comprehensive Metabolic Panel:    Component Value Date/Time   NA 137 02/27/2018 0958   NA 139 12/20/2011 0927   K 4.4 02/27/2018 0958   K 4.1 12/20/2011 0927   CL 107 02/27/2018 0958   CL 102 12/20/2011 0927   CO2 24 02/27/2018 0958   CO2 26 12/20/2011 0927   BUN 21 02/27/2018 0958   BUN 19 (H) 12/20/2011 0927   CREATININE 1.21 (H) 02/27/2018 0958   CREATININE 1.15 12/20/2011 0927   GLUCOSE 156 (H) 02/27/2018 0958   GLUCOSE 251 (H) 12/20/2011 0927   CALCIUM 8.5 (L) 02/27/2018 0958   CALCIUM 8.6 12/20/2011 0927   AST 115 (H) 02/27/2018 0958   AST 49 (H) 12/20/2011 0927   ALT 32 02/27/2018 0958   ALT 57 12/20/2011 0927   ALKPHOS 181 (H) 02/27/2018 0958   ALKPHOS 61 12/20/2011 0927   BILITOT 0.6 02/27/2018 0958   BILITOT 0.4 12/20/2011 0927   PROT 5.6 (L) 02/27/2018 0958   PROT 6.8 12/20/2011 0927   ALBUMIN 3.0 (L) 02/27/2018 0958   ALBUMIN 3.6 12/20/2011 0927    RADIOGRAPHIC STUDIES: Mr Liver W Wo Contrast  Result Date:  02/22/2018 CLINICAL DATA:  Metastatic breast cancer.  Followup liver lesions. EXAM: MRI ABDOMEN WITHOUT AND WITH CONTRAST TECHNIQUE: Multiplanar multisequence MR imaging of the abdomen was performed both before and after the administration of intravenous contrast. CONTRAST:  8 cc Gadavist. COMPARISON:  CT AP 12/18/2017 FINDINGS: Lower chest: Small bilateral pleural effusions appear increased from previous exam. Hepatobiliary: There are innumerable liver lesions identified throughout both lobes of liver compatible with widespread hepatic metastasis. These lesions are too numerous to count and are seen thoughout both lobes. This demonstrates a significant interval progression when compared with 12/18/17. A few sample lesions as follows: -large confluent mass within the dome of liver involving segments 4 and segment 2 measures 11.6 by 4.1 by 2.6 cm. New from previous exam. -the previous index lesion within segment 5 measures 4.0 by 3.9 cm, image 20/9. Previously 2.0 x 1.1 cm. -new lesion along the dome of segment 8 measures 1.8 x 1.8 cm, image 6/9. -new lesion within segment 6 measures 1.9 x 1.9 cm, image 28/9. Mild edema involving the wall of gallbladder.  Nonspecific. Pancreas: No mass, inflammatory changes, or other parenchymal abnormality identified. Spleen:  Within normal limits in size and appearance. Adrenals/Urinary Tract: Normal appearance of the adrenal glands. There is no kidney mass or hydronephrosis identified bilaterally. Mild bilateral renal cortical thinning identified. Stomach/Bowel: Visualized portions within the abdomen are unremarkable. Vascular/Lymphatic: Aortic atherosclerosis. No aneurysm. The portal vein remains patent. No significant adenopathy Other:  Small volume of perihepatic ascites is identified. Musculoskeletal: Multifocal bone metastases are identified. IMPRESSION: 1. Marked interval progression of liver metastases. 2. Increase in volume  of pleural effusions. 3. Perihepatic ascites 4.  Diffuse bone metastasis Electronically Signed   By: Kerby Moors M.D.   On: 02/22/2018 09:46   Ct Abdomen W Wo Contrast  Result Date: 02/20/2018 CLINICAL DATA:  68 year old female with a history of stage IV metastatic breast cancer with a solitary liver metastasis. She presents today for CT-guided percutaneous thermal ablation. EXAM: CT ABDOMEN WITHOUT AND WITH CONTRAST TECHNIQUE: Multidetector CT imaging of the abdomen was performed following the standard protocol before and following the bolus administration of intravenous contrast. CONTRAST:  134m ISOVUE-370 IOPAMIDOL (ISOVUE-370) INJECTION 76% COMPARISON:  Prior CT scan of the abdomen and pelvis 12/18/2017 FINDINGS: Lower chest: Dependent atelectasis in both lower lobes with small bilateral pleural effusions. Mild cardiomegaly with a small pericardial effusion. The intracardiac blood pool is hypodense relative to the adjacent myocardium consistent with anemia. Hepatobiliary: The liver remains similar in size and configuration although there is now some slight nodularity of the hepatic contour which was not definitively appreciated on prior imaging. The liver is diffusely heterogeneous on the precontrast images. Following administration of arterial contrast, there is an extremely heterogeneous perfusion pattern of the liver with multifocal areas of hypoenhancement, some of which is confluent in nature. The previously identified lesion in hepatic segment 5 has enlarged and now measures approximately 2.9 x 1.8 cm. On the portal venous phase imaging, the liver remains extremely heterogeneous in appearance. The known lesion in segment 5 remains similar in size. There are other masslike areas of relatively low attenuation which are more amorphous and difficult to measure discretely. The gallbladder is contracted. No biliary ductal dilatation. Pancreas: Unremarkable. No pancreatic ductal dilatation or surrounding inflammatory changes. Spleen: Normal in size  without focal abnormality. Adrenals/Urinary Tract: Normal adrenal glands. No evidence of hydronephrosis or enhancing renal lesion. Parapelvic renal sinus cysts noted on the left. Nonobstructing punctate stone in the right lower pole collecting system, unchanged. Stomach/Bowel: No focal bowel wall thickening or evidence of obstruction. Vascular/Lymphatic: Calcifications present in the abdominal aorta. Other: No abdominal wall hernia or abnormality. Musculoskeletal: Multifocal sclerotic osseous lesions again noted consistent with known osseous metastatic disease. Similar appearance of left mastectomy surgical site with small residual seroma. No significant interval change. IMPRESSION: Unfortunately, there is been interval development of extreme heterogeneity of the hepatic parenchyma over the relatively short interval compared to the prior CT scan dated 12/18/2017. Correlation with intra procedural ultrasound imaging demonstrates multifocal hypoechoic solid masses throughout the liver several of which have a more infiltrative appearance. The overall imaging appearance is most consistent with significant interval progression of hepatic metastatic disease much of which now has a diffusely infiltrative appearance. The only other non malignant possibility would be sudden interval development of extensive masslike fatty infiltration which would be atypical over such a short interval. If further imaging is clinically warranted, MRI of the abdomen with and without gadolinium contrast could confirm. Electronically Signed   By: HJacqulynn CadetM.D.   On: 02/20/2018 14:29   Ir Radiologist Eval & Mgmt  Result Date: 01/29/2018 Please refer to notes tab for details about interventional procedure. (Op Note)   PERFORMANCE STATUS (ECOG) : 1 - Symptomatic but completely ambulatory  Review of Systems As noted above. Otherwise, a complete review of systems is negative.  Physical Exam General: NAD, frail  appearing Pulmonary: unlabored Extremities: no edema, no joint deformities Skin: no rashes Neurological: Weakness but otherwise nonfocal  IMPRESSION: I met with patient and her friend today in the clinic for routine follow up regarding  her pain.    Patient says the pain is significantly improved with prn use of oxycodone and a salve she is applying to her legs. Patient says she is using the oxycodone twice daily on average. She currently reports being pain free.   Symptomatically, patient denies any other distressing symptoms, changes, or decline. She denies constipation.   Will need to address ACP during a future visit when she is not in the infusion area.  PLAN: -Continue current scope of treatment -Continue oxycodone prn for pain -Recommend senna as needed for constipation -Will need to address ACP during future visit -RTC in 1 week   Patient expressed understanding and was in agreement with this plan. She also understands that She can call clinic at any time with any questions, concerns, or complaints.     Time Total: 10 minutes  Visit consisted of counseling and education dealing with the complex and emotionally intense issues of symptom management and palliative care in the setting of serious and potentially life-threatening illness.Greater than 50%  of this time was spent counseling and coordinating care related to the above assessment and plan.  Signed by: Altha Harm, PhD, NP-C [phone] (Work Cell)

## 2018-02-27 NOTE — Progress Notes (Signed)
AST 115.  Ok to proceed per md

## 2018-02-27 NOTE — Progress Notes (Signed)
Patient here today for follow up and treatment consideration regarding breast cancer. Patient is concerned about a tooth that has broken cap. She thinks she might have infected tooth, asking about timing of dental work with new treatment.

## 2018-02-27 NOTE — Progress Notes (Signed)
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  INTERVAL HISTORY: Patient returns to clinic today for further evaluation and initiation of cycle 1, day 1 of [place].  Her pain is much better controlled.  She currently feels well and is asymptomatic. She has a chronic peripheral neuropathy, but no other neurologic complaints.  She denies any recent fevers or illnesses.  She denies any chest pain, cough, hemoptysis, or shortness of breath.  Her appetite has significantly improved.  She denies any nausea, vomiting, constipation, or diarrhea. She has no urinary complaints.  Patient offers no specific complaints today.  REVIEW OF SYSTEMS:   Review of Systems  Constitutional: Positive for malaise/fatigue. Negative for fever and weight loss.  Eyes: Negative.  Negative for blurred vision, double vision and pain.  Respiratory: Negative.  Negative for cough and shortness of breath.   Cardiovascular: Negative.  Negative for chest pain and leg swelling.  Gastrointestinal: Negative.  Negative for abdominal pain.  Genitourinary: Negative.  Negative for dysuria and flank pain.  Musculoskeletal: Negative for back pain and falls.  Skin: Negative.  Negative for rash.  Neurological: Positive for tingling, sensory change and weakness. Negative for focal weakness and headaches.  Endo/Heme/Allergies: Negative.   Psychiatric/Behavioral: Negative for memory loss. The patient is nervous/anxious.     As per HPI. Otherwise, a complete review of systems is negative.  PAST MEDICAL HISTORY: Past Medical History:  Diagnosis Date  . Anxiety   . Back pain    occasionally  .  Breast cancer ([place]) 2009   left  . Depression    takes Paxil and Wellbutrin daily  . Diabetes ([place])    takes Metformin daily   type 2   . Genetic testing 02/03/2017   Multi-Cancer panel (83 genes) @ [facility] - No pathogenic mutations detected  . GERD (gastroesophageal reflux disease)    occasional  . History of bronchitis 2 yrs ago  . History of kidney stones   . Hyperlipidemia    takes Atorvastatin daily  . Hypertension    no meds  . Insomnia    takes Ambien nightly  . Insomnia    takes gabapentin nightly  . Joint pain   . Mood swings   . Osteoarthritis of knee   . Personal history of chemotherapy 12/05/2016   Mets from Breast Cancer  . Personal history of radiation therapy 11/2016  . Pneumonia   . PONV (postoperative nausea and vomiting)   . Seasonal allergies    takes Allegra daily    PAST SURGICAL HISTORY: Past Surgical History:  Procedure Laterality Date  . BREAST BIOPSY  2009  . cataract surgery Bilateral   . COLONOSCOPY    . IR RADIOLOGIST EVAL & MGMT  01/29/2018  . JOINT REPLACEMENT Left 2014   knee  . KNEE ARTHROSCOPY Left    x 5  . MASTECTOMY Left   . port a cath placed    . PORTACATH PLACEMENT N/A 09/17/2015   Procedure: INSERTION PORT-A-CATH;  Surgeon: Jules Husbands, MD;  Location: [facility];  Service: General;  Laterality: N/A;  . RADIOLOGY WITH ANESTHESIA N/A 02/20/2018   Procedure: CT WITH ANESTHESIA MICROWAVE THERMAL ABLATION-LIVER;  Surgeon: Jacqulynn Cadet, MD;  Location:  [facility];  Service: Anesthesiology;  Laterality: N/A;  . TOOTH EXTRACTION    . TOTAL SHOULDER ARTHROPLASTY Right 06/03/2015   Procedure: TOTAL SHOULDER ARTHROPLASTY;  Surgeon: Tania Ade, MD;  Location: [facility];  Service: Orthopedics;  Laterality: Right;  Right total shoulder arthroplasty  . TOTAL SHOULDER REPLACEMENT Right 06/03/2015  . TUBAL LIGATION      FAMILY HISTORY: Father with non-Hodgkin's lymphoma, 2 paternal aunts with breast cancer.     ADVANCED DIRECTIVES:     HEALTH MAINTENANCE: Social History   Tobacco Use  . Smoking status: Never Smoker  . Smokeless tobacco: Never Used  Substance Use Topics  . Alcohol use: Yes    Alcohol/week: 0.0 standard drinks    Comment: occasionally wine  . Drug use: Yes    Types: Marijuana    Comment: cannabis with no extra  low dose edibles  for neuropathy     Colonoscopy:  PAP:  Bone density:  Lipid panel:  Allergies  Allergen Reactions  . Ace Inhibitors Cough  . Latex Itching  . Morphine And Related Itching    Caused her to itch terribly. Would prefer if given to take with a benadryl  . Penicillins Rash    Has patient had a PCN reaction causing immediate rash, facial/tongue/throat swelling, SOB or lightheadedness with hypotension: No Has patient had a PCN reaction causing severe rash involving mucus membranes or skin necrosis: No Has patient had a PCN reaction that required hospitalization No Has patient had a PCN reaction occurring within the last 10 years: No If all of the above answers are "NO", then may proceed with Cephalosporin use.    Current Outpatient Medications  Medication Sig Dispense Refill  . acetaminophen (TYLENOL) 500 MG tablet Take 500 mg by mouth every 6 (six) hours as needed for mild pain or moderate pain.     Marland Kitchen atorvastatin (LIPITOR) 10 MG tablet Take 10 mg by mouth at bedtime.     Marland Kitchen buPROPion (WELLBUTRIN XL) 150 MG 24 hr tablet Take 150 mg by mouth at bedtime.     . Calcium-Magnesium-Vitamin D (CALCIUM 1200+D3 PO) Take 1 tablet by mouth daily.     . Cyanocobalamin 5000 MCG CAPS Take 5,000 mcg by mouth daily.     . ferrous sulfate 325 (65 FE) MG EC tablet Take 325 mg by mouth daily.     . fexofenadine (ALLEGRA) 180 MG tablet Take 180 mg by mouth at bedtime.     . gabapentin (NEURONTIN) 300 MG capsule Take 300-600 mg by mouth See admin instructions. Take 300 mg by mouth in the morning and 600 mg at night    . lidocaine-prilocaine (EMLA) cream Apply 1 application topically as  needed. Apply to port 1-2 hours prior to chemotherapy appointment. Cover with plastic wrap. 30 g 0  . metFORMIN (GLUCOPHAGE) 850 MG tablet Take 850 mg by mouth 2 (two) times daily with a meal.     . Oxycodone HCl 10 MG TABS Take 1 tablet (10 mg total) by mouth every 4 (four) hours as needed. Ok to take every 4-6 hours as needed. 90 tablet 0  . pantoprazole (PROTONIX) 40 MG tablet Take 40 mg by mouth at bedtime.     Marland Kitchen PARoxetine (PAXIL) 40 MG tablet Take 40 mg by mouth at bedtime.     . prochlorperazine (COMPAZINE) 10 MG tablet Take 1 tablet (10 mg total) by mouth every 6 (six) hours as needed for nausea or vomiting. 30 tablet 3  . sodium chloride (MURO  128) 5 % ophthalmic solution Place 1 drop into both eyes 4 (four) times daily.    Marland Kitchen zolpidem (AMBIEN) 10 MG tablet Take 10 mg by mouth at bedtime as needed (sleep).      No current facility-administered medications for this visit.    Facility-Administered Medications Ordered in Other Visits  Medication Dose Route Frequency Provider Last Rate Last Dose  . sodium chloride flush (NS) 0.9 % injection 10 mL  10 mL Intravenous PRN Lloyd Huger, MD   10 mL at 02/27/18 1005    OBJECTIVE: Vitals:   02/27/18 1020  BP: (!) 143/85  Pulse: 85  Resp: 18  Temp: 97.7 F (36.5 C)     Body mass index is 32.95 kg/m.    ECOG FS:0 - Asymptomatic  General: Well-developed, well-nourished, no acute distress. Eyes: Pink conjunctiva, anicteric sclera. HEENT: Normocephalic, moist mucous membranes. Lungs: Clear to auscultation bilaterally. Heart: Regular rate and rhythm. No rubs, murmurs, or gallops. Abdomen: Soft, nontender, nondistended. No organomegaly noted, normoactive bowel sounds. Musculoskeletal: No edema, cyanosis, or clubbing. Neuro: Alert, answering all questions appropriately. Cranial nerves grossly intact. Skin: No rashes or petechiae noted. Psych: Normal affect.  LAB RESULTS:  Lab Results  Component Value Date   NA 137 02/27/2018    K 4.4 02/27/2018   CL 107 02/27/2018   CO2 24 02/27/2018   GLUCOSE 156 (H) 02/27/2018   BUN 21 02/27/2018   CREATININE 1.21 (H) 02/27/2018   CALCIUM 8.5 (L) 02/27/2018   PROT 5.6 (L) 02/27/2018   ALBUMIN 3.0 (L) 02/27/2018   AST 115 (H) 02/27/2018   ALT 32 02/27/2018   ALKPHOS 181 (H) 02/27/2018   BILITOT 0.6 02/27/2018   GFRNONAA 47 (L) 02/27/2018   GFRAA 54 (L) 02/27/2018    Lab Results  Component Value Date   WBC 3.5 (L) 02/27/2018   NEUTROABS 2.1 02/27/2018   HGB 8.6 (L) 02/27/2018   HCT 28.0 (L) 02/27/2018   MCV 103.3 (H) 02/27/2018   PLT 263 02/27/2018     STUDIES: Mr Liver W Wo Contrast  Result Date: 02/22/2018 CLINICAL DATA:  Metastatic breast cancer.  Followup liver lesions. EXAM: MRI ABDOMEN WITHOUT AND WITH CONTRAST TECHNIQUE: Multiplanar multisequence MR imaging of the abdomen was performed both before and after the administration of intravenous contrast. CONTRAST:  8 cc Gadavist. COMPARISON:  CT AP 12/18/2017 FINDINGS: Lower chest: Small bilateral pleural effusions appear increased from previous exam. Hepatobiliary: There are innumerable liver lesions identified throughout both lobes of liver compatible with widespread hepatic metastasis. These lesions are too numerous to count and are seen thoughout both lobes. This demonstrates a significant interval progression when compared with 12/18/17. A few sample lesions as follows: -large confluent mass within the dome of liver involving segments 4 and segment 2 measures 11.6 by 4.1 by 2.6 cm. New from previous exam. -the previous index lesion within segment 5 measures 4.0 by 3.9 cm, image 20/9. Previously 2.0 x 1.1 cm. -new lesion along the dome of segment 8 measures 1.8 x 1.8 cm, image 6/9. -new lesion within segment 6 measures 1.9 x 1.9 cm, image 28/9. Mild edema involving the wall of gallbladder.  Nonspecific. Pancreas: No mass, inflammatory changes, or other parenchymal abnormality identified. Spleen:  Within normal limits in  size and appearance. Adrenals/Urinary Tract: Normal appearance of the adrenal glands. There is no kidney mass or hydronephrosis identified bilaterally. Mild bilateral renal cortical thinning identified. Stomach/Bowel: Visualized portions within the abdomen are unremarkable. Vascular/Lymphatic: Aortic atherosclerosis. No aneurysm. The portal  vein remains patent. No significant adenopathy Other:  Small volume of perihepatic ascites is identified. Musculoskeletal: Multifocal bone metastases are identified. IMPRESSION: 1. Marked interval progression of liver metastases. 2. Increase in volume of pleural effusions. 3. Perihepatic ascites 4. Diffuse bone metastasis Electronically Signed   By: Kerby Moors M.D.   On: 02/22/2018 09:46   Ct Abdomen W Wo Contrast  Result Date: 02/20/2018 CLINICAL DATA:  68 year old female with a history of stage IV metastatic breast cancer with a solitary liver metastasis. She presents today for CT-guided percutaneous thermal ablation. EXAM: CT ABDOMEN WITHOUT AND WITH CONTRAST TECHNIQUE: Multidetector CT imaging of the abdomen was performed following the standard protocol before and following the bolus administration of intravenous contrast. CONTRAST:  16m ISOVUE-370 IOPAMIDOL (ISOVUE-370) INJECTION 76% COMPARISON:  Prior CT scan of the abdomen and pelvis 12/18/2017 FINDINGS: Lower chest: Dependent atelectasis in both lower lobes with small bilateral pleural effusions. Mild cardiomegaly with a small pericardial effusion. The intracardiac blood pool is hypodense relative to the adjacent myocardium consistent with anemia. Hepatobiliary: The liver remains similar in size and configuration although there is now some slight nodularity of the hepatic contour which was not definitively appreciated on prior imaging. The liver is diffusely heterogeneous on the precontrast images. Following administration of arterial contrast, there is an extremely heterogeneous perfusion pattern of the liver  with multifocal areas of hypoenhancement, some of which is confluent in nature. The previously identified lesion in hepatic segment 5 has enlarged and now measures approximately 2.9 x 1.8 cm. On the portal venous phase imaging, the liver remains extremely heterogeneous in appearance. The known lesion in segment 5 remains similar in size. There are other masslike areas of relatively low attenuation which are more amorphous and difficult to measure discretely. The gallbladder is contracted. No biliary ductal dilatation. Pancreas: Unremarkable. No pancreatic ductal dilatation or surrounding inflammatory changes. Spleen: Normal in size without focal abnormality. Adrenals/Urinary Tract: Normal adrenal glands. No evidence of hydronephrosis or enhancing renal lesion. Parapelvic renal sinus cysts noted on the left. Nonobstructing punctate stone in the right lower pole collecting system, unchanged. Stomach/Bowel: No focal bowel wall thickening or evidence of obstruction. Vascular/Lymphatic: Calcifications present in the abdominal aorta. Other: No abdominal wall hernia or abnormality. Musculoskeletal: Multifocal sclerotic osseous lesions again noted consistent with known osseous metastatic disease. Similar appearance of left mastectomy surgical site with small residual seroma. No significant interval change. IMPRESSION: Unfortunately, there is been interval development of extreme heterogeneity of the hepatic parenchyma over the relatively short interval compared to the prior CT scan dated 12/18/2017. Correlation with intra procedural ultrasound imaging demonstrates multifocal hypoechoic solid masses throughout the liver several of which have a more infiltrative appearance. The overall imaging appearance is most consistent with significant interval progression of hepatic metastatic disease much of which now has a diffusely infiltrative appearance. The only other non malignant possibility would be sudden interval development of  extensive masslike fatty infiltration which would be atypical over such a short interval. If further imaging is clinically warranted, MRI of the abdomen with and without gadolinium contrast could confirm. Electronically Signed   By: HJacqulynn CadetM.D.   On: 02/20/2018 14:29   Ir Radiologist Eval & Mgmt  Result Date: 01/29/2018 Please refer to notes tab for details about interventional procedure. 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  PLAN:    1.  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  Patient wishes to continue with palliative treatment.  She has discontinued Ibrance and letrozole.  Proceed with cycle 1, day 1 of palliative [place] today.  Patient will also receive Zometa on day 1 of the the odd-numbered cycles.  Return to clinic in 1 week for further evaluation and consideration of cycle 1, day 8.   2.  Osteopenia: Patient's bone mineral density on January 26, 2016 reported a T score of -0.9 which is considered normal. Continue calcium and vitamin D.  Zometa as above.   3.  Anemia: Patient's hemoglobin is slowly trending down of 8.6, monitor. 4. Peripheral neuropathy: Chronic and unchanged.  Continue gabapentin as prescribed. Neuropathy managed by neurology.  5. Back pain: MRI results from May 04, 2017 did not reveal metastatic disease.  Continue symptomatic treatment.  6.  Left IJ clot: Ultrasound results reviewed independently.  Patient has been instructed to discontinue Eliquis. 7.  Leukopenia: Chronic and unchanged. 8.  Dental complaints: Patient has been instructed if she needs dental intervention, to proceed as scheduled per her dentist.  Any invasive procedures likely will need to be timed several days prior to day  1 of her next cycle. 9.  Memory complaints: MRI of the brain on December 21, 2017 confirmed widespread osseous metastatic disease, but no intracranial abnormality was noted.  Patient expressed understanding and was in agreement with this plan. She also understands that She can call clinic at any time with any questions, concerns, or complaints.   Breast cancer   Staging form: Breast, AJCC 7th Edition     Pathologic stage from 08/11/2014: Stage IIIA (T0, N2a, cM0) - Signed by Lloyd Huger, MD on 08/11/2014   Lloyd Huger, MD   02/27/2018 1:12 PM

## 2018-03-03 ENCOUNTER — Encounter: Payer: Self-pay | Admitting: Intensive Care

## 2018-03-03 ENCOUNTER — Other Ambulatory Visit: Payer: Self-pay

## 2018-03-03 ENCOUNTER — Emergency Department: Payer: Medicare Other

## 2018-03-03 ENCOUNTER — Observation Stay
Admission: EM | Admit: 2018-03-03 | Discharge: 2018-03-04 | Disposition: A | Discharge status: Home / Self Care | Payer: Medicare Other | Attending: Internal Medicine | Admitting: Internal Medicine

## 2018-03-03 DIAGNOSIS — R002 Palpitations: Secondary | ICD-10-CM | POA: Insufficient documentation

## 2018-03-03 DIAGNOSIS — Z170 Estrogen receptor positive status [ER+]: Secondary | ICD-10-CM | POA: Diagnosis not present

## 2018-03-03 DIAGNOSIS — M1710 Unilateral primary osteoarthritis, unspecified knee: Secondary | ICD-10-CM | POA: Insufficient documentation

## 2018-03-03 DIAGNOSIS — G4700 Insomnia, unspecified: Secondary | ICD-10-CM | POA: Insufficient documentation

## 2018-03-03 DIAGNOSIS — G620 Drug-induced polyneuropathy: Secondary | ICD-10-CM | POA: Insufficient documentation

## 2018-03-03 DIAGNOSIS — R7989 Other specified abnormal findings of blood chemistry: Secondary | ICD-10-CM

## 2018-03-03 DIAGNOSIS — I5031 Acute diastolic (congestive) heart failure: Secondary | ICD-10-CM | POA: Diagnosis not present

## 2018-03-03 DIAGNOSIS — I110 Hypertensive heart disease with heart failure: Secondary | ICD-10-CM | POA: Diagnosis not present

## 2018-03-03 DIAGNOSIS — F419 Anxiety disorder, unspecified: Secondary | ICD-10-CM | POA: Diagnosis not present

## 2018-03-03 DIAGNOSIS — K219 Gastro-esophageal reflux disease without esophagitis: Secondary | ICD-10-CM | POA: Diagnosis not present

## 2018-03-03 DIAGNOSIS — Z79891 Long term (current) use of opiate analgesic: Secondary | ICD-10-CM | POA: Diagnosis not present

## 2018-03-03 DIAGNOSIS — E119 Type 2 diabetes mellitus without complications: Secondary | ICD-10-CM | POA: Diagnosis not present

## 2018-03-03 DIAGNOSIS — Z79899 Other long term (current) drug therapy: Secondary | ICD-10-CM | POA: Insufficient documentation

## 2018-03-03 DIAGNOSIS — Z923 Personal history of irradiation: Secondary | ICD-10-CM | POA: Insufficient documentation

## 2018-03-03 DIAGNOSIS — Z803 Family history of malignant neoplasm of breast: Secondary | ICD-10-CM | POA: Insufficient documentation

## 2018-03-03 DIAGNOSIS — I10 Essential (primary) hypertension: Secondary | ICD-10-CM | POA: Diagnosis not present

## 2018-03-03 DIAGNOSIS — Z96611 Presence of right artificial shoulder joint: Secondary | ICD-10-CM | POA: Insufficient documentation

## 2018-03-03 DIAGNOSIS — E785 Hyperlipidemia, unspecified: Secondary | ICD-10-CM | POA: Insufficient documentation

## 2018-03-03 DIAGNOSIS — F329 Major depressive disorder, single episode, unspecified: Secondary | ICD-10-CM | POA: Diagnosis not present

## 2018-03-03 DIAGNOSIS — E860 Dehydration: Secondary | ICD-10-CM | POA: Insufficient documentation

## 2018-03-03 DIAGNOSIS — R42 Dizziness and giddiness: Secondary | ICD-10-CM

## 2018-03-03 DIAGNOSIS — R531 Weakness: Secondary | ICD-10-CM | POA: Diagnosis not present

## 2018-03-03 DIAGNOSIS — Z880 Allergy status to penicillin: Secondary | ICD-10-CM | POA: Insufficient documentation

## 2018-03-03 DIAGNOSIS — Z7984 Long term (current) use of oral hypoglycemic drugs: Secondary | ICD-10-CM | POA: Insufficient documentation

## 2018-03-03 DIAGNOSIS — R000 Tachycardia, unspecified: Secondary | ICD-10-CM | POA: Diagnosis not present

## 2018-03-03 DIAGNOSIS — C50912 Malignant neoplasm of unspecified site of left female breast: Secondary | ICD-10-CM | POA: Insufficient documentation

## 2018-03-03 DIAGNOSIS — Z885 Allergy status to narcotic agent status: Secondary | ICD-10-CM | POA: Insufficient documentation

## 2018-03-03 DIAGNOSIS — T451X5A Adverse effect of antineoplastic and immunosuppressive drugs, initial encounter: Secondary | ICD-10-CM | POA: Diagnosis not present

## 2018-03-03 DIAGNOSIS — Z96652 Presence of left artificial knee joint: Secondary | ICD-10-CM | POA: Diagnosis not present

## 2018-03-03 DIAGNOSIS — Z8249 Family history of ischemic heart disease and other diseases of the circulatory system: Secondary | ICD-10-CM | POA: Insufficient documentation

## 2018-03-03 DIAGNOSIS — R740 Nonspecific elevation of levels of transaminase and lactic acid dehydrogenase [LDH]: Secondary | ICD-10-CM | POA: Diagnosis not present

## 2018-03-03 DIAGNOSIS — Z9221 Personal history of antineoplastic chemotherapy: Secondary | ICD-10-CM | POA: Diagnosis not present

## 2018-03-03 DIAGNOSIS — R0602 Shortness of breath: Secondary | ICD-10-CM | POA: Diagnosis not present

## 2018-03-03 DIAGNOSIS — Z800 Family history of malignant neoplasm of digestive organs: Secondary | ICD-10-CM | POA: Insufficient documentation

## 2018-03-03 DIAGNOSIS — Z888 Allergy status to other drugs, medicaments and biological substances status: Secondary | ICD-10-CM | POA: Insufficient documentation

## 2018-03-03 DIAGNOSIS — Z91040 Latex allergy status: Secondary | ICD-10-CM | POA: Insufficient documentation

## 2018-03-03 DIAGNOSIS — C50919 Malignant neoplasm of unspecified site of unspecified female breast: Secondary | ICD-10-CM | POA: Diagnosis not present

## 2018-03-03 LAB — CBC WITH DIFFERENTIAL/PLATELET
Abs Immature Granulocytes: 0.02 10*3/uL (ref 0.00–0.07)
BASOS ABS: 0.1 10*3/uL (ref 0.0–0.1)
Basophils Relative: 2 %
Eosinophils Absolute: 0 10*3/uL (ref 0.0–0.5)
Eosinophils Relative: 1 %
HCT: 30.9 % — ABNORMAL LOW (ref 36.0–46.0)
Hemoglobin: 9.4 g/dL — ABNORMAL LOW (ref 12.0–15.0)
Immature Granulocytes: 1 %
Lymphocytes Relative: 16 %
Lymphs Abs: 0.5 10*3/uL — ABNORMAL LOW (ref 0.7–4.0)
MCH: 32.1 pg (ref 26.0–34.0)
MCHC: 30.4 g/dL (ref 30.0–36.0)
MCV: 105.5 fL — ABNORMAL HIGH (ref 80.0–100.0)
Monocytes Absolute: 0.2 10*3/uL (ref 0.1–1.0)
Monocytes Relative: 7 %
NEUTROS ABS: 2.3 10*3/uL (ref 1.7–7.7)
Neutrophils Relative %: 73 %
Platelets: 335 10*3/uL (ref 150–400)
RBC: 2.93 MIL/uL — ABNORMAL LOW (ref 3.87–5.11)
RDW: 15.3 % (ref 11.5–15.5)
Smear Review: NORMAL
WBC: 3.1 10*3/uL — ABNORMAL LOW (ref 4.0–10.5)
nRBC: 0 % (ref 0.0–0.2)

## 2018-03-03 LAB — URINALYSIS, COMPLETE (UACMP) WITH MICROSCOPIC
Bacteria, UA: NONE SEEN
Bilirubin Urine: NEGATIVE
Glucose, UA: NEGATIVE mg/dL
Ketones, ur: NEGATIVE mg/dL
Leukocytes,Ua: NEGATIVE
Nitrite: NEGATIVE
Protein, ur: 30 mg/dL — AB
Specific Gravity, Urine: 1.015 (ref 1.005–1.030)
pH: 6 (ref 5.0–8.0)

## 2018-03-03 LAB — BASIC METABOLIC PANEL
Anion gap: 11 (ref 5–15)
BUN: 17 mg/dL (ref 8–23)
CHLORIDE: 109 mmol/L (ref 98–111)
CO2: 20 mmol/L — ABNORMAL LOW (ref 22–32)
CREATININE: 1.19 mg/dL — AB (ref 0.44–1.00)
Calcium: 8.7 mg/dL — ABNORMAL LOW (ref 8.9–10.3)
GFR calc Af Amer: 55 mL/min — ABNORMAL LOW (ref >60)
GFR calc non Af Amer: 48 mL/min — ABNORMAL LOW (ref >60)
Glucose, Bld: 141 mg/dL — ABNORMAL HIGH (ref 70–99)
Potassium: 3.9 mmol/L (ref 3.5–5.1)
Sodium: 140 mmol/L (ref 135–145)

## 2018-03-03 LAB — CBC
HCT: 30.5 % — ABNORMAL LOW (ref 36.0–46.0)
Hemoglobin: 9.4 g/dL — ABNORMAL LOW (ref 12.0–15.0)
MCH: 31.8 pg (ref 26.0–34.0)
MCHC: 30.8 g/dL (ref 30.0–36.0)
MCV: 103 fL — ABNORMAL HIGH (ref 80.0–100.0)
Platelets: 325 10*3/uL (ref 150–400)
RBC: 2.96 MIL/uL — AB (ref 3.87–5.11)
RDW: 15.6 % — ABNORMAL HIGH (ref 11.5–15.5)
WBC: 3.1 10*3/uL — ABNORMAL LOW (ref 4.0–10.5)
nRBC: 0 % (ref 0.0–0.2)

## 2018-03-03 LAB — LACTIC ACID, PLASMA
LACTIC ACID, VENOUS: 2.1 mmol/L — AB (ref 0.5–1.9)
Lactic Acid, Venous: 3.4 mmol/L (ref 0.5–1.9)

## 2018-03-03 LAB — TROPONIN I: Troponin I: 0.04 ng/mL (ref <0.03)

## 2018-03-03 MED ORDER — INSULIN ASPART 100 UNIT/ML ~~LOC~~ SOLN: 0.0000 [IU] | Freq: Every day | SUBCUTANEOUS | Status: DC

## 2018-03-03 MED ORDER — VANCOMYCIN HCL IN DEXTROSE 1-5 GM/200ML-% IV SOLN
1000.0000 mg | Freq: Once | INTRAVENOUS | Status: AC
  Administered 2018-03-03: 1000 mg via INTRAVENOUS
  Filled 2018-03-03: qty 200

## 2018-03-03 MED ORDER — SODIUM CHLORIDE 0.9 % IV SOLN
INTRAVENOUS | Status: DC
  Administered 2018-03-03: 22:00:00 via INTRAVENOUS

## 2018-03-03 MED ORDER — IOHEXOL 350 MG/ML SOLN
75.0000 mL | Freq: Once | INTRAVENOUS | Status: AC | PRN
  Administered 2018-03-03: 75 mL via INTRAVENOUS

## 2018-03-03 MED ORDER — SODIUM CHLORIDE 0.9 % IV SOLN
2.0000 g | Freq: Once | INTRAVENOUS | Status: AC
  Administered 2018-03-03: 2 g via INTRAVENOUS
  Filled 2018-03-03: qty 2

## 2018-03-03 MED ORDER — INSULIN ASPART 100 UNIT/ML ~~LOC~~ SOLN: 0.0000 [IU] | Freq: Three times a day (TID) | SUBCUTANEOUS | Status: DC

## 2018-03-03 MED ORDER — SODIUM CHLORIDE 0.9 % IV SOLN: Freq: Once | INTRAVENOUS | Status: DC

## 2018-03-03 MED ORDER — SODIUM CHLORIDE 0.9 % IV BOLUS
1000.0000 mL | Freq: Once | INTRAVENOUS | Status: AC
  Administered 2018-03-03: 1000 mL via INTRAVENOUS

## 2018-03-03 MED ORDER — OXYCODONE HCL 5 MG PO TABS
5.0000 mg | ORAL_TABLET | Freq: Once | ORAL | Status: AC
  Administered 2018-03-03: 5 mg via ORAL
  Filled 2018-03-03: qty 1

## 2018-03-03 NOTE — H&P (Signed)
[facility] [place] NAME: Amanda [name]    MR#:  [id number]  DATE OF BIRTH:  [birth year]  DATE OF ADMISSION:  03/03/2018  PRIMARY CARE PHYSICIAN: Sofie Hartigan, MD   REQUESTING/REFERRING PHYSICIAN: Archie Balboa  CHIEF COMPLAINT:   Chief Complaint  Patient presents with  . Shortness of Breath    HISTORY OF PRESENT ILLNESS: Amanda [name]  is a 68 y.o. female with a known history of anxiety, breast cancer with metastasis, depression, diabetes, hyperlipidemia-recently found to have worsening of her metastatic disease and started on new chemotherapy treatment last Wednesday.  Since Thursday she is not feeling too good and yesterday she is getting worse to the point that she is feeling short of breath on minimal exertion walking across the room and also feeling dizzy with this exertion with some palpitations.  Concerned with this she came to emergency room.  Her lactic acid was slightly elevated but her vitals were stable.  On initial work-up in ER she did not found to have any clear source of infections.  ER physician suggested to observe under medical services and give some IV fluid with admitting diagnosis of dehydration.  PAST MEDICAL HISTORY:   Past Medical History:  Diagnosis Date  . Anxiety   . Back pain    occasionally  . Breast cancer ([place]) 2009   left  . Depression    takes Paxil and Wellbutrin daily  . Diabetes ([place])    takes Metformin daily   type 2   . Genetic testing 02/03/2017   Multi-Cancer panel (83 genes) @ [facility] - No pathogenic mutations detected  . GERD (gastroesophageal reflux disease)    occasional  . History of bronchitis 2 yrs ago  . History of kidney stones   . Hyperlipidemia    takes Atorvastatin daily  . Hypertension    no meds  . Insomnia    takes Ambien nightly  . Insomnia    takes gabapentin nightly  . Joint pain   . Mood swings   . Osteoarthritis of knee   . Personal history of chemotherapy 12/05/2016    Mets from Breast Cancer  . Personal history of radiation therapy 11/2016  . Pneumonia   . PONV (postoperative nausea and vomiting)   . Seasonal allergies    takes Allegra daily    PAST SURGICAL HISTORY:  Past Surgical History:  Procedure Laterality Date  . BREAST BIOPSY  2009  . cataract surgery Bilateral   . COLONOSCOPY    . IR RADIOLOGIST EVAL & MGMT  01/29/2018  . JOINT REPLACEMENT Left 2014   knee  . KNEE ARTHROSCOPY Left    x 5  . MASTECTOMY Left   . port a cath placed    . PORTACATH PLACEMENT N/A 09/17/2015   Procedure: INSERTION PORT-A-CATH;  Surgeon: Jules Husbands, MD;  Location: [facility];  Service: General;  Laterality: N/A;  . RADIOLOGY WITH ANESTHESIA N/A 02/20/2018   Procedure: CT WITH ANESTHESIA MICROWAVE THERMAL ABLATION-LIVER;  Surgeon: Jacqulynn Cadet, MD;  Location: [facility];  Service: Anesthesiology;  Laterality: N/A;  . TOOTH EXTRACTION    . TOTAL SHOULDER ARTHROPLASTY Right 06/03/2015   Procedure: TOTAL SHOULDER ARTHROPLASTY;  Surgeon: Tania Ade, MD;  Location: [facility];  Service: Orthopedics;  Laterality: Right;  Right total shoulder arthroplasty  . TOTAL SHOULDER REPLACEMENT Right 06/03/2015  . TUBAL LIGATION      SOCIAL HISTORY:  Social History   Tobacco Use  . Smoking status: Never  Smoker  . Smokeless tobacco: Never Used  Substance Use Topics  . Alcohol use: Yes    Alcohol/week: 0.0 standard drinks    Comment: occasionally wine    FAMILY HISTORY:  Family History  Problem Relation Age of Onset  . Atrial fibrillation Mother        deceased 12; TAH/BSO  . Non-Hodgkin's lymphoma Father        deceased 70  . Heart attack Maternal Uncle   . Melanoma Maternal Uncle   . Breast cancer Paternal Aunt        dx in 59s  . Breast cancer Paternal Aunt        dx in 28s  . Cancer Brother 1       unk. type  . Breast cancer Other        Mat grandmother's 2 sisters; dx at 51 and 7  . Stomach cancer Paternal Grandfather 56       deceased 57s  .  Breast cancer Cousin        daughter of pat aunt with breast ca    DRUG ALLERGIES:  Allergies  Allergen Reactions  . Ace Inhibitors Cough  . Latex Itching  . Morphine And Related Itching    Caused her to itch terribly. Would prefer if given to take with a benadryl  . Penicillins Rash    Has patient had a PCN reaction causing immediate rash, facial/tongue/throat swelling, SOB or lightheadedness with hypotension: No Has patient had a PCN reaction causing severe rash involving mucus membranes or skin necrosis: No Has patient had a PCN reaction that required hospitalization No Has patient had a PCN reaction occurring within the last 10 years: No If all of the above answers are "NO", then may proceed with Cephalosporin use.    REVIEW OF SYSTEMS:   CONSTITUTIONAL: No fever, have fatigue or weakness.  EYES: No blurred or double vision.  EARS, NOSE, AND THROAT: No tinnitus or ear pain.  RESPIRATORY: No cough, shortness of breath, wheezing or hemoptysis.  CARDIOVASCULAR: No chest pain, orthopnea, edema.  GASTROINTESTINAL: No nausea, vomiting, diarrhea or abdominal pain.  GENITOURINARY: No dysuria, hematuria.  ENDOCRINE: No polyuria, nocturia,  HEMATOLOGY: No anemia, easy bruising or bleeding SKIN: No rash or lesion. MUSCULOSKELETAL: No joint pain or arthritis.   NEUROLOGIC: No tingling, numbness, weakness.  PSYCHIATRY: No anxiety or depression.   MEDICATIONS AT HOME:  Prior to Admission medications   Medication Sig Start Date End Date Taking? Authorizing Provider  atorvastatin (LIPITOR) 10 MG tablet Take 10 mg by mouth at bedtime.    Yes [provider]  buPROPion (WELLBUTRIN XL) 150 MG 24 hr tablet Take 150 mg by mouth at bedtime.  08/08/13  Yes [provider]  Calcium-Magnesium-Vitamin D (CALCIUM 1200+D3 PO) Take 1 tablet by mouth daily.    Yes [provider]  Cyanocobalamin 5000 MCG CAPS Take 5,000 mcg by mouth daily.    Yes [provider]   ferrous sulfate 325 (65 FE) MG EC tablet Take 325 mg by mouth daily.    Yes [provider]  fexofenadine (ALLEGRA) 180 MG tablet Take 180 mg by mouth at bedtime.    Yes [provider]  gabapentin (NEURONTIN) 300 MG capsule Take 300-600 mg by mouth See admin instructions. Take 300 mg by mouth in the morning and 600 mg at night   Yes [provider]  metFORMIN (GLUCOPHAGE) 850 MG tablet Take 850 mg by mouth 2 (two) times daily with a meal.  Yes [provider]  Oxycodone HCl 10 MG TABS Take 1 tablet (10 mg total) by mouth every 4 (four) hours as needed. Ok to take every 4-6 hours as needed. 02/25/18  Yes Lloyd Huger, MD  pantoprazole (PROTONIX) 40 MG tablet Take 40 mg by mouth at bedtime.    Yes [provider]  PARoxetine (PAXIL) 40 MG tablet Take 40 mg by mouth at bedtime.  08/08/13  Yes [provider]  acetaminophen (TYLENOL) 500 MG tablet Take 500 mg by mouth every 6 (six) hours as needed for mild pain or moderate pain.     [provider]  lidocaine-prilocaine (EMLA) cream Apply 1 application topically as needed. Apply to port 1-2 hours prior to chemotherapy appointment. Cover with plastic wrap. 09/27/15   Lloyd Huger, MD  prochlorperazine (COMPAZINE) 10 MG tablet Take 1 tablet (10 mg total) by mouth every 6 (six) hours as needed for nausea or vomiting. 02/21/18   Lloyd Huger, MD  sodium chloride (MURO 128) 5 % ophthalmic solution Place 1 drop into both eyes 4 (four) times daily.    [provider]  zolpidem (AMBIEN) 10 MG tablet Take 10 mg by mouth at bedtime as needed (sleep).     [provider]      PHYSICAL EXAMINATION:   VITAL SIGNS: Blood pressure 132/79, pulse 97, temperature 98.3 F (36.8 C), temperature source Oral, resp. rate 16, height 5\' 3"  (1.6 m), weight 84.4 kg, SpO2 97 %.  GENERAL:  68 y.o.-year-old patient lying in the bed with no acute distress.  EYES: Pupils equal,  round, reactive to light and accommodation. No scleral icterus. Extraocular muscles intact.  HEENT: Head atraumatic, normocephalic. Oropharynx and nasopharynx clear.  NECK:  Supple, no jugular venous distention. No thyroid enlargement, no tenderness.  LUNGS: Normal breath sounds bilaterally, no wheezing, rales,rhonchi or crepitation. No use of accessory muscles of respiration.  CARDIOVASCULAR: S1, S2 normal. No murmurs, rubs, or gallops.  ABDOMEN: Soft, nontender, nondistended. Bowel sounds present. No organomegaly or mass.  EXTREMITIES: No pedal edema, cyanosis, or clubbing.  NEUROLOGIC: Cranial nerves II through XII are intact. Muscle strength 5/5 in all extremities. Sensation intact. Gait not checked.  PSYCHIATRIC: The patient is alert and oriented x 3.  SKIN: No obvious rash, lesion, or ulcer.   LABORATORY PANEL:   CBC Recent Labs  Lab 02/27/18 0958 03/03/18 1517  WBC 3.5* 3.1*  3.1*  HGB 8.6* 9.4*  9.4*  HCT 28.0* 30.9*  30.5*  PLT 263 335  325  MCV 103.3* 105.5*  103.0*  MCH 31.7 32.1  31.8  MCHC 30.7 30.4  30.8  RDW 16.5* 15.3  15.6*  LYMPHSABS 0.4* 0.5*  MONOABS 0.8 0.2  EOSABS 0.1 0.0  BASOSABS 0.1 0.1   ------------------------------------------------------------------------------------------------------------------  Chemistries  Recent Labs  Lab 02/27/18 0958 03/03/18 1517  NA 137 140  K 4.4 3.9  CL 107 109  CO2 24 20*  GLUCOSE 156* 141*  BUN 21 17  CREATININE 1.21* 1.19*  CALCIUM 8.5* 8.7*  AST 115*  --   ALT 32  --   ALKPHOS 181*  --   BILITOT 0.6  --    ------------------------------------------------------------------------------------------------------------------ estimated creatinine clearance is 47.9 mL/min (A) (by C-G formula based on SCr of 1.19 mg/dL (H)). ------------------------------------------------------------------------------------------------------------------ No results for input(s): TSH, T4TOTAL, T3FREE, THYROIDAB in the  last 72 hours.  Invalid input(s): FREET3   Coagulation profile No results for input(s): INR, PROTIME in the last 168 hours. ------------------------------------------------------------------------------------------------------------------- No  results for input(s): DDIMER in the last 72 hours. -------------------------------------------------------------------------------------------------------------------  Cardiac Enzymes Recent Labs  Lab 03/03/18 1517  TROPONINI 0.04*   ------------------------------------------------------------------------------------------------------------------ Invalid input(s): POCBNP  ---------------------------------------------------------------------------------------------------------------  Urinalysis    Component Value Date/Time   COLORURINE YELLOW (A) 03/03/2018 1834   APPEARANCEUR CLEAR (A) 03/03/2018 1834   APPEARANCEUR Clear 02/22/2016 1506   LABSPEC 1.015 03/03/2018 1834   PHURINE 6.0 03/03/2018 1834   GLUCOSEU NEGATIVE 03/03/2018 1834   HGBUR SMALL (A) 03/03/2018 1834   BILIRUBINUR NEGATIVE 03/03/2018 1834   BILIRUBINUR Negative 02/22/2016 [place] 03/03/2018 1834   PROTEINUR 30 (A) 03/03/2018 1834   NITRITE NEGATIVE 03/03/2018 1834   LEUKOCYTESUR NEGATIVE 03/03/2018 1834     RADIOLOGY: Dg Chest 2 View  Result Date: 03/03/2018 CLINICAL DATA:  Three-day history of gradually worsening shortness of breath. Current history of metastatic inflammatory breast cancer for which the patient received her most recent chemotherapy on 02/27/2018. EXAM: CHEST - 2 VIEW COMPARISON:  None. FINDINGS: AP ERECT and LATERAL images were obtained. Cardiac silhouette upper normal in size for AP technique. Thoracic aorta minimally atherosclerotic, unchanged. Hilar and mediastinal contours otherwise unremarkable. RIGHT jugular Port-A-Cath tip at the cavoatrial junction, unchanged. Lungs clear. Bronchovascular markings normal. Pulmonary  vascularity normal. No visible pleural effusions. No pneumothorax. Prior RIGHT shoulder hemiarthroplasty with anatomic alignment. Remote healed BILATERAL rib fractures. IMPRESSION: No acute cardiopulmonary disease. Electronically Signed   By: Evangeline Dakin M.D.   On: 03/03/2018 16:54    EKG: Orders placed or performed during the hospital encounter of 03/03/18  . ED EKG  . ED EKG    IMPRESSION AND PLAN: *Dizziness and shortness of breath This is a new complaints since she was started on new chemotherapy agent last Wednesday She also feels palpitation on minimal walking across the room. This could be either due to chemotherapy induced cardiomyopathy or pulmonary embolism. Although it could be something simple as dehydration. Chest x-ray and infection work-ups are negative so far I will get CT scan angiogram chest to rule out PE. Echocardiogram to check for cardiac function. Monitor on telemetry to rule out any arrhythmia. We will give gentle hydration.  *Metastatic breast cancer Follows with cancer center and Dr. Grayland Ormond Continue the same plan.  *Diabetes Hold metformin for now as she would be getting CT scan with contrast. Sliding scale coverage.    All the records are reviewed and case discussed with ED provider. Management plans discussed with the patient, family and they are in agreement.  CODE STATUS: Full code. Code Status History    Date Active Date Inactive Code Status Order ID Comments User Context   11/03/2017 2110 11/06/2017 1826 Full Code [id number]  Saundra Shelling, MD Inpatient    Advance Directive Documentation     Most Recent Value  Type of Advance Directive  Living will, Healthcare Power of Attorney  Pre-existing out of facility DNR order (yellow form or pink MOST form)  -  "MOST" Form in Place?  -     Patient's daughter-in-law was present in the room during my visit.  TOTAL TIME TAKING CARE OF THIS PATIENT: 50 minutes.    Vaughan Basta M.D  on 03/03/2018   Between 7am to 6pm - Pager - [phone]  After 6pm go to [email or web address] - password [email or web address] [facility]  Office  [phone]  CC: Primary care physician; Sofie Hartigan, MD   Note: This dictation was prepared with Dragon dictation along with smaller phrase technology. Any transcriptional errors  that result from this process are unintentional.

## 2018-03-03 NOTE — Progress Notes (Signed)
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  INTERVAL HISTORY: Patient returns to clinic today for further evaluation and consideration of cycle 1, day 8 of Halaven.  She was admitted to the hospital overnight several days ago for worsening weakness and fatigue, shortness of breath, dehydration.  CT scan of the chest did not reveal PE.  She continues to have weakness and fatigue, but feels improved since her hospitalization.  She has a chronic peripheral neuropathy, but no other neurologic complaints.  She denies any fevers.  She denies any chest pain, cough, or hemoptysis. She denies any nausea, vomiting, constipation, or diarrhea. She has no urinary complaints.  Patient offers no further specific complaints today.  REVIEW OF SYSTEMS:   Review of Systems  Constitutional: Positive for malaise/fatigue. Negative for fever and weight loss.  Eyes: Negative.  Negative for blurred vision, double vision and pain.  Respiratory: Negative.  Negative for cough and shortness of breath.   Cardiovascular: Negative.  Negative for chest pain and leg swelling.  Gastrointestinal: Negative.  Negative for abdominal pain.  Genitourinary: Negative.  Negative for dysuria and flank pain.  Musculoskeletal: Negative for back pain and falls.  Skin: Negative.  Negative for rash.  Neurological: Positive for tingling, sensory change and weakness. Negative for focal weakness and headaches.  Endo/Heme/Allergies: Negative.   Psychiatric/Behavioral: Negative.  Negative for memory loss. The patient is not nervous/anxious.     As per HPI. Otherwise, a complete review of systems is  negative.  PAST MEDICAL HISTORY: Past Medical History:  Diagnosis Date  . Anxiety   . Back pain    occasionally  . Breast cancer ([place]) 2009   left  . Depression    takes Paxil and Wellbutrin daily  . Diabetes ([place])    takes Metformin daily   type 2   . Genetic testing 02/03/2017   Multi-Cancer panel (83 genes) @ [facility] - No pathogenic mutations detected  . GERD (gastroesophageal reflux disease)    occasional  . History of bronchitis 2 yrs ago  . History of kidney stones   . Hyperlipidemia    takes Atorvastatin daily  . Hypertension    no meds  . Insomnia    takes Ambien nightly  . Insomnia    takes gabapentin nightly  . Joint pain   . Mood swings   . Osteoarthritis of knee   . Personal history of chemotherapy 12/05/2016   Mets from Breast Cancer  . Personal history of radiation therapy 11/2016  . Pneumonia   . PONV (postoperative nausea and vomiting)   . Seasonal allergies    takes Allegra daily    PAST SURGICAL HISTORY: Past Surgical History:  Procedure Laterality Date  . BREAST BIOPSY  2009  . cataract surgery Bilateral   . COLONOSCOPY    . IR RADIOLOGIST EVAL & MGMT  01/29/2018  . JOINT REPLACEMENT Left 2014   knee  . KNEE ARTHROSCOPY Left    x 5  . MASTECTOMY Left   . port a cath placed    . PORTACATH PLACEMENT N/A 09/17/2015   Procedure: INSERTION PORT-A-CATH;  Surgeon: Jules Husbands, MD;  Location: [facility];  Service: General;  Laterality: N/A;  .  RADIOLOGY WITH ANESTHESIA N/A 02/20/2018   Procedure: CT WITH ANESTHESIA MICROWAVE THERMAL ABLATION-LIVER;  Surgeon: Jacqulynn Cadet, MD;  Location: [facility];  Service: Anesthesiology;  Laterality: N/A;  . TOOTH EXTRACTION    . TOTAL SHOULDER ARTHROPLASTY Right 06/03/2015   Procedure: TOTAL SHOULDER ARTHROPLASTY;  Surgeon: Tania Ade, MD;  Location: [place];  Service: Orthopedics;  Laterality: Right;  Right total shoulder arthroplasty  . TOTAL SHOULDER REPLACEMENT Right 06/03/2015  . TUBAL LIGATION       FAMILY HISTORY: Father with non-Hodgkin's lymphoma, 2 paternal aunts with breast cancer.     ADVANCED DIRECTIVES:    HEALTH MAINTENANCE: Social History   Tobacco Use  . Smoking status: Never Smoker  . Smokeless tobacco: Never Used  Substance Use Topics  . Alcohol use: Yes    Alcohol/week: 0.0 standard drinks    Comment: occasionally wine  . Drug use: Yes    Types: Marijuana    Comment: cannabis with no extra  low dose edibles  for neuropathy     Colonoscopy:  PAP:  Bone density:  Lipid panel:  Allergies  Allergen Reactions  . Ace Inhibitors Cough  . Latex Itching  . Morphine And Related Itching    Caused her to itch terribly. Would prefer if given to take with a benadryl  . Penicillins Rash    Has patient had a PCN reaction causing immediate rash, facial/tongue/throat swelling, SOB or lightheadedness with hypotension: No Has patient had a PCN reaction causing severe rash involving mucus membranes or skin necrosis: No Has patient had a PCN reaction that required hospitalization No Has patient had a PCN reaction occurring within the last 10 years: No If all of the above answers are "NO", then may proceed with Cephalosporin use.    Current Outpatient Medications  Medication Sig Dispense Refill  . acetaminophen (TYLENOL) 500 MG tablet Take 500 mg by mouth every 6 (six) hours as needed for mild pain or moderate pain.     Marland Kitchen atorvastatin (LIPITOR) 10 MG tablet Take 10 mg by mouth at bedtime.     Marland Kitchen buPROPion (WELLBUTRIN XL) 150 MG 24 hr tablet Take 150 mg by mouth at bedtime.     . Calcium-Magnesium-Vitamin D (CALCIUM 1200+D3 PO) Take 1 tablet by mouth daily.     . Cyanocobalamin 5000 MCG CAPS Take 5,000 mcg by mouth daily.     . ferrous sulfate 325 (65 FE) MG EC tablet Take 325 mg by mouth daily.     . fexofenadine (ALLEGRA) 180 MG tablet Take 180 mg by mouth at bedtime.     . gabapentin (NEURONTIN) 300 MG capsule Take 300-600 mg by mouth See admin instructions. Take  300 mg by mouth in the morning and 600 mg at night    . lidocaine-prilocaine (EMLA) cream Apply 1 application topically as needed. Apply to port 1-2 hours prior to chemotherapy appointment. Cover with plastic wrap. 30 g 0  . metFORMIN (GLUCOPHAGE) 850 MG tablet Take 850 mg by mouth 2 (two) times daily with a meal.     . Oxycodone HCl 10 MG TABS Take 1 tablet (10 mg total) by mouth every 4 (four) hours as needed. Ok to take every 4-6 hours as needed. 90 tablet 0  . pantoprazole (PROTONIX) 40 MG tablet Take 40 mg by mouth at bedtime.     Marland Kitchen PARoxetine (PAXIL) 40 MG tablet Take 40 mg by mouth at bedtime.     . prochlorperazine (COMPAZINE) 10 MG tablet Take 1 tablet (10 mg  total) by mouth every 6 (six) hours as needed for nausea or vomiting. 30 tablet 3  . sodium chloride (MURO 128) 5 % ophthalmic solution Place 1 drop into both eyes 4 (four) times daily.    Marland Kitchen zolpidem (AMBIEN) 10 MG tablet Take 10 mg by mouth at bedtime as needed (sleep).      No current facility-administered medications for this visit.     OBJECTIVE: Vitals:   03/06/18 0942 03/06/18 0950  BP: 120/77   Pulse: (!) 103   Temp: 98 F (36.7 C)   SpO2:  98%     Body mass index is 31.94 kg/m.    ECOG FS:1 - Symptomatic but completely ambulatory  General: Well-developed, well-nourished, no acute distress. Eyes: Pink conjunctiva, anicteric sclera. HEENT: Normocephalic, moist mucous membranes. Lungs: Clear to auscultation bilaterally. Heart: Regular rate and rhythm. No rubs, murmurs, or gallops. Abdomen: Soft, nontender, nondistended. No organomegaly noted, normoactive bowel sounds. Musculoskeletal: No edema, cyanosis, or clubbing. Neuro: Alert, answering all questions appropriately. Cranial nerves grossly intact. Skin: No rashes or petechiae noted. Psych: Normal affect.  LAB RESULTS:  Lab Results  Component Value Date   NA 138 03/06/2018   K 3.6 03/06/2018   CL 108 03/06/2018   CO2 20 (L) 03/06/2018   GLUCOSE 188 (H)  03/06/2018   BUN 10 03/06/2018   CREATININE 1.27 (H) 03/06/2018   CALCIUM 8.9 03/06/2018   PROT 5.8 (L) 03/06/2018   ALBUMIN 2.9 (L) 03/06/2018   AST 241 (H) 03/06/2018   ALT 54 (H) 03/06/2018   ALKPHOS 217 (H) 03/06/2018   BILITOT 0.7 03/06/2018   GFRNONAA 44 (L) 03/06/2018   GFRAA 51 (L) 03/06/2018    Lab Results  Component Value Date   WBC 1.2 (LL) 03/06/2018   NEUTROABS 0.1 (L) 03/06/2018   HGB 8.0 (L) 03/06/2018   HCT 25.8 (L) 03/06/2018   MCV 101.6 (H) 03/06/2018   PLT 315 03/06/2018     STUDIES: Dg Chest 2 View  Result Date: 03/03/2018 CLINICAL DATA:  Three-day history of gradually worsening shortness of breath. Current history of metastatic inflammatory breast cancer for which the patient received her most recent chemotherapy on 02/27/2018. EXAM: CHEST - 2 VIEW COMPARISON:  None. FINDINGS: AP ERECT and LATERAL images were obtained. Cardiac silhouette upper normal in size for AP technique. Thoracic aorta minimally atherosclerotic, unchanged. Hilar and mediastinal contours otherwise unremarkable. RIGHT jugular Port-A-Cath tip at the cavoatrial junction, unchanged. Lungs clear. Bronchovascular markings normal. Pulmonary vascularity normal. No visible pleural effusions. No pneumothorax. Prior RIGHT shoulder hemiarthroplasty with anatomic alignment. Remote healed BILATERAL rib fractures. IMPRESSION: No acute cardiopulmonary disease. Electronically Signed   By: Evangeline Dakin M.D.   On: 03/03/2018 16:54   Ct Angio Chest Pe W Or Wo Contrast  Result Date: 03/03/2018 CLINICAL DATA:  68 year old female with shortness of breath. Metastatic breast cancer, recently started on New chemotherapy. EXAM: CT ANGIOGRAPHY CHEST WITH CONTRAST TECHNIQUE: Multidetector CT imaging of the chest was performed using the standard protocol during bolus administration of intravenous contrast. Multiplanar CT image reconstructions and MIPs were obtained to evaluate the vascular anatomy. CONTRAST:  9m  OMNIPAQUE IOHEXOL 350 MG/ML SOLN COMPARISON:  Chest radiographs earlier today. CT Abdomen and Pelvis 02/20/2018. Chest CTA 11/03/2017. FINDINGS: Cardiovascular: Adequate contrast bolus timing in the pulmonary arterial tree. No focal filling defect identified in the pulmonary arteries to suggest acute pulmonary embolism. Calcified coronary artery atherosclerosis. Negative visible aorta aside from mild calcified plaque in the upper abdomen. Stable mild cardiomegaly. No  pericardial effusion. Right chest porta cath. Mediastinum/Nodes: Negative, no lymphadenopathy. Lungs/Pleura: Small or trace layering left pleural effusion. Chronic scarring in the anterior left upper lobe, perhaps radiation related. Peribronchial ground-glass opacity elsewhere seen in October has virtually resolved. The trachea and major airways are patent. No pulmonary consolidation. No new abnormal pulmonary opacity. Upper Abdomen: Stable elevation of the right hemidiaphragm. Heterogeneous enhancement of the liver was better demonstrated on 02/20/2018. The visible spleen, pancreas, adrenal glands, left kidney remain normal. Small gastric hiatal hernia. Negative visible bowel. Musculoskeletal: Stable left chest wall since October. Widespread skeletal metastases. Superimposed right shoulder arthroplasty. No pathologic fracture identified. Review of the MIP images confirms the above findings. IMPRESSION: 1.  Negative for acute pulmonary embolus. 2. Trace layering left pleural effusion but no other acute findings in the chest. 3. Widespread skeletal and hepatic metastatic disease. Electronically Signed   By: Genevie Ann M.D.   On: 03/03/2018 21:44   Mr Liver W TD Contrast  Result Date: 02/22/2018 CLINICAL DATA:  Metastatic breast cancer.  Followup liver lesions. EXAM: MRI ABDOMEN WITHOUT AND WITH CONTRAST TECHNIQUE: Multiplanar multisequence MR imaging of the abdomen was performed both before and after the administration of intravenous contrast. CONTRAST:   8 cc Gadavist. COMPARISON:  CT AP 12/18/2017 FINDINGS: Lower chest: Small bilateral pleural effusions appear increased from previous exam. Hepatobiliary: There are innumerable liver lesions identified throughout both lobes of liver compatible with widespread hepatic metastasis. These lesions are too numerous to count and are seen thoughout both lobes. This demonstrates a significant interval progression when compared with 12/18/17. A few sample lesions as follows: -large confluent mass within the dome of liver involving segments 4 and segment 2 measures 11.6 by 4.1 by 2.6 cm. New from previous exam. -the previous index lesion within segment 5 measures 4.0 by 3.9 cm, image 20/9. Previously 2.0 x 1.1 cm. -new lesion along the dome of segment 8 measures 1.8 x 1.8 cm, image 6/9. -new lesion within segment 6 measures 1.9 x 1.9 cm, image 28/9. Mild edema involving the wall of gallbladder.  Nonspecific. Pancreas: No mass, inflammatory changes, or other parenchymal abnormality identified. Spleen:  Within normal limits in size and appearance. Adrenals/Urinary Tract: Normal appearance of the adrenal glands. There is no kidney mass or hydronephrosis identified bilaterally. Mild bilateral renal cortical thinning identified. Stomach/Bowel: Visualized portions within the abdomen are unremarkable. Vascular/Lymphatic: Aortic atherosclerosis. No aneurysm. The portal vein remains patent. No significant adenopathy Other:  Small volume of perihepatic ascites is identified. Musculoskeletal: Multifocal bone metastases are identified. IMPRESSION: 1. Marked interval progression of liver metastases. 2. Increase in volume of pleural effusions. 3. Perihepatic ascites 4. Diffuse bone metastasis Electronically Signed   By: Kerby Moors M.D.   On: 02/22/2018 09:46   Ct Abdomen W Wo Contrast  Result Date: 02/20/2018 CLINICAL DATA:  68 year old female with a history of stage IV metastatic breast cancer with a solitary liver metastasis. She  presents today for CT-guided percutaneous thermal ablation. EXAM: CT ABDOMEN WITHOUT AND WITH CONTRAST TECHNIQUE: Multidetector CT imaging of the abdomen was performed following the standard protocol before and following the bolus administration of intravenous contrast. CONTRAST:  153m ISOVUE-370 IOPAMIDOL (ISOVUE-370) INJECTION 76% COMPARISON:  Prior CT scan of the abdomen and pelvis 12/18/2017 FINDINGS: Lower chest: Dependent atelectasis in both lower lobes with small bilateral pleural effusions. Mild cardiomegaly with a small pericardial effusion. The intracardiac blood pool is hypodense relative to the adjacent myocardium consistent with anemia. Hepatobiliary: The liver remains similar in size and configuration  although there is now some slight nodularity of the hepatic contour which was not definitively appreciated on prior imaging. The liver is diffusely heterogeneous on the precontrast images. Following administration of arterial contrast, there is an extremely heterogeneous perfusion pattern of the liver with multifocal areas of hypoenhancement, some of which is confluent in nature. The previously identified lesion in hepatic segment 5 has enlarged and now measures approximately 2.9 x 1.8 cm. On the portal venous phase imaging, the liver remains extremely heterogeneous in appearance. The known lesion in segment 5 remains similar in size. There are other masslike areas of relatively low attenuation which are more amorphous and difficult to measure discretely. The gallbladder is contracted. No biliary ductal dilatation. Pancreas: Unremarkable. No pancreatic ductal dilatation or surrounding inflammatory changes. Spleen: Normal in size without focal abnormality. Adrenals/Urinary Tract: Normal adrenal glands. No evidence of hydronephrosis or enhancing renal lesion. Parapelvic renal sinus cysts noted on the left. Nonobstructing punctate stone in the right lower pole collecting system, unchanged. Stomach/Bowel: No  focal bowel wall thickening or evidence of obstruction. Vascular/Lymphatic: Calcifications present in the abdominal aorta. Other: No abdominal wall hernia or abnormality. Musculoskeletal: Multifocal sclerotic osseous lesions again noted consistent with known osseous metastatic disease. Similar appearance of left mastectomy surgical site with small residual seroma. No significant interval change. IMPRESSION: Unfortunately, there is been interval development of extreme heterogeneity of the hepatic parenchyma over the relatively short interval compared to the prior CT scan dated 12/18/2017. Correlation with intra procedural ultrasound imaging demonstrates multifocal hypoechoic solid masses throughout the liver several of which have a more infiltrative appearance. The overall imaging appearance is most consistent with significant interval progression of hepatic metastatic disease much of which now has a diffusely infiltrative appearance. The only other non malignant possibility would be sudden interval development of extensive masslike fatty infiltration which would be atypical over such a short interval. If further imaging is clinically warranted, MRI of the abdomen with and without gadolinium contrast could confirm. Electronically Signed   By: Jacqulynn Cadet M.D.   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  PLAN:    1.  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  Patient wishes to continue with palliative treatment.  She has discontinued Ibrance and letrozole.  Delay cycle 1, day 8 of palliative Halaven today secondary to declining performance status and neutropenia.  Patient will continue to receive Zometa on day 1 of each treatment.   Return to clinic in 1 week for further evaluation and reconsideration treatment.  Will likely have to dose reduce Halaven in the future.    2.  Osteopenia: Patient's bone mineral density on January 26, 2016 reported a T score of -0.9 which is considered normal. Continue calcium and vitamin D.  Zometa as above.   3.  Anemia: Patient's hemoglobin is decreased, relatively stable at 8.0.   4. Peripheral neuropathy: Chronic and unchanged.  Continue gabapentin as prescribed. Neuropathy managed by neurology.  5. Back pain: MRI results from May 04, 2017 did not reveal metastatic disease.  Continue symptomatic treatment.  6.  Left IJ clot: Ultrasound results reviewed independently.  Patient has been instructed to discontinue Eliquis. 7.  Neutropenia: Delay treatment as above.  Consider dose reduction and possibly adding Neulasta to her regimen. 8.  Dental complaints: Patient has been instructed if she needs dental intervention, to proceed as scheduled per her dentist.  Any invasive procedures likely will need to be timed several days prior to day 1 of her next cycle. 9.  Memory complaints: MRI of the brain on December 21, 2017 confirmed widespread osseous metastatic disease, but no intracranial abnormality was noted. 10.  Renal insufficiency/elevated creatinine: Patient received 1 L IV fluids today.  Patient expressed understanding and was in agreement with this plan. She also understands that She can call clinic at any time with any questions, concerns, or complaints.   Breast cancer   Staging form: Breast, AJCC 7th Edition     Pathologic stage from 08/11/2014: Stage IIIA (T0, N2a, cM0) - Signed by Lloyd Huger, MD on 08/11/2014   Lloyd Huger, MD   03/06/2018 3:03 PM

## 2018-03-03 NOTE — Progress Notes (Addendum)
Family Meeting Note  Advance Directive:yes  Today a meeting took place with the Patient and daughter in law.  The following clinical team members were present during this meeting:MD  The following were discussed:Patient's diagnosis: Metastatic breast cancer, diabetes, shortness of breath and dizziness, Patient's progosis: Unable to determine and Goals for treatment: Full Code  Additional follow-up to be provided: PMD  Time spent during discussion:20 minutes  Vaughan Basta, MD

## 2018-03-03 NOTE — ED Provider Notes (Signed)
[facility] Emergency Department Provider Note _____________________________________   I have reviewed the triage vital signs and the nursing notes.   HISTORY  Chief Complaint Shortness of Breath   History limited by: Not Limited   HPI Amanda [name] is a 68 y.o. female who presents to the emergency department today because of concerns for shortness of breath and feeling unwell.  Patient states that the shortness of breath started 3 days ago.  This is also when her feeling sick began.  She just received chemotherapy the day before.  Since then she is felt like the shortness of breath is been somewhat progressive.  She can only walk a few feet before becoming shortness of breath.  She denies any associated chest pain.  She denies any associated fevers.  She has been trying to stay hydrated although has had decreased solid food intake.  She has had some nausea although states that the nausea medication she has at home has been effective.  Patient has not noticed any bad odor or painful urination.   Per medical record review patient has a history of breast cancer  Past Medical History:  Diagnosis Date  . Anxiety   . Back pain    occasionally  . Breast cancer ([place]) 2009   left  . Depression    takes Paxil and Wellbutrin daily  . Diabetes ([place])    takes Metformin daily   type 2   . Genetic testing 02/03/2017   Multi-Cancer panel (83 genes) @ [facility] - No pathogenic mutations detected  . GERD (gastroesophageal reflux disease)    occasional  . History of bronchitis 2 yrs ago  . History of kidney stones   . Hyperlipidemia    takes Atorvastatin daily  . Hypertension    no meds  . Insomnia    takes Ambien nightly  . Insomnia    takes gabapentin nightly  . Joint pain   . Mood swings   . Osteoarthritis of knee   . Personal history of chemotherapy 12/05/2016   Mets from Breast Cancer  . Personal history of radiation therapy 11/2016  . Pneumonia   . PONV  (postoperative nausea and vomiting)   . Seasonal allergies    takes Allegra daily    Patient Active Problem List   Diagnosis Date Noted  . CAP (community acquired pneumonia) 11/03/2017  . Acute deep vein thrombosis (DVT) of axillary vein (HCC) 04/05/2017  . Syncope 04/05/2017  . DOE (dyspnea on exertion) 04/03/2017  . Neuropathy due to chemotherapeutic drug ([place]) 03/19/2017  . Genetic testing 02/03/2017  . Contusion of knee 12/06/2016  . Dislocation of shoulder joint 12/06/2016  . Shoulder joint pain 12/06/2016  . Localized, primary osteoarthritis 12/06/2016  . Osteoarthritis of knee 12/06/2016  . Burning sensation 11/13/2016  . Numbness and tingling 11/13/2016  . Malignant neoplasm of left breast in female, estrogen receptor positive ([place]) 10/05/2016  . Carcinoma of left breast, stage 4 ([place]) 04/06/2016  . Chemotherapy-induced neuropathy ([place]) 03/07/2016  . Metastatic breast cancer ([place]) 09/24/2015  . 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 Personal history of breast cancer 08/23/2015  . 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 Degenerative arthritis of right shoulder region 01/13/2015  . Cancer of left female breast  ([place]) 07/27/2014  . 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 Rotator cuff tear 06/10/2014  . Allergic rhinitis, seasonal 09/16/2013  . Depression 08/10/2013  . Diabetes mellitus type 2, uncomplicated ([place])  08/10/2013  . Hyperlipidemia, unspecified 08/10/2013  . BP (high blood pressure) 08/10/2013  . 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 BREAST BIOPSY  2009  . cataract surgery Bilateral   . COLONOSCOPY    . IR RADIOLOGIST EVAL & MGMT  01/29/2018  . JOINT REPLACEMENT Left 2014   knee  . KNEE ARTHROSCOPY Left    x 5  . MASTECTOMY Left   . port a cath placed    . PORTACATH PLACEMENT N/A 09/17/2015   Procedure: INSERTION PORT-A-CATH;   Surgeon: Jules Husbands, MD;  Location: [facility] ORS;  Service: General;  Laterality: N/A;  . RADIOLOGY WITH ANESTHESIA N/A 02/20/2018   Procedure: CT WITH ANESTHESIA MICROWAVE THERMAL ABLATION-LIVER;  Surgeon: Jacqulynn Cadet, MD;  Location: [facility] ORS;  Service: Anesthesiology;  Laterality: N/A;  . TOOTH EXTRACTION    . TOTAL SHOULDER ARTHROPLASTY Right 06/03/2015   Procedure: TOTAL SHOULDER ARTHROPLASTY;  Surgeon: Tania Ade, MD;  Location: [place];  Service: Orthopedics;  Laterality: Right;  Right total shoulder arthroplasty  . TOTAL SHOULDER REPLACEMENT Right 06/03/2015  . TUBAL LIGATION      Prior to Admission medications   Medication Sig Start Date End Date Taking? Authorizing Provider  acetaminophen (TYLENOL) 500 MG tablet Take 500 mg by mouth every 6 (six) hours as needed for mild pain or moderate pain.     [provider]  atorvastatin (LIPITOR) 10 MG tablet Take 10 mg by mouth at bedtime.     [provider]  buPROPion (WELLBUTRIN XL) 150 MG 24 hr tablet Take 150 mg by mouth at bedtime.  08/08/13   [provider]  Calcium-Magnesium-Vitamin D (CALCIUM 1200+D3 PO) Take 1 tablet by mouth daily.     [provider]  Cyanocobalamin 5000 MCG CAPS Take 5,000 mcg by mouth daily.     [provider]  ferrous sulfate 325 (65 FE) MG EC tablet Take 325 mg by mouth daily.     [provider]  fexofenadine (ALLEGRA) 180 MG tablet Take 180 mg by mouth at bedtime.     [provider]  gabapentin (NEURONTIN) 300 MG capsule Take 300-600 mg by mouth See admin instructions. Take 300 mg by mouth in the morning and 600 mg at night    [provider]  lidocaine-prilocaine (EMLA) cream Apply 1 application topically as needed. Apply to port 1-2 hours prior to chemotherapy appointment. Cover with plastic wrap. 09/27/15   Lloyd Huger, MD  metFORMIN (GLUCOPHAGE) 850 MG tablet Take 850 mg by mouth 2 (two) times daily with a meal.      [provider]  Oxycodone HCl 10 MG TABS Take 1 tablet (10 mg total) by mouth every 4 (four) hours as needed. Ok to take every 4-6 hours as needed. 02/25/18   Lloyd Huger, MD  pantoprazole (PROTONIX) 40 MG tablet Take 40 mg by mouth at bedtime.     [provider]  PARoxetine (PAXIL) 40 MG tablet Take 40 mg by mouth at bedtime.  08/08/13   [provider]  prochlorperazine (COMPAZINE) 10 MG tablet Take 1 tablet (10 mg total) by mouth every 6 (six) hours as needed for nausea or vomiting. 02/21/18   Lloyd Huger, MD  sodium chloride (MURO 128) 5 % ophthalmic solution Place 1 drop into both eyes 4 (four) times daily.    [provider]  zolpidem (AMBIEN) 10 MG tablet Take 10 mg by mouth at bedtime  as needed (sleep).     [provider]    Allergies Ace inhibitors; Latex; Morphine and related; and Penicillins  Family History  Problem Relation Age of Onset  . Atrial fibrillation Mother        deceased 50; TAH/BSO  . Non-Hodgkin's lymphoma Father        deceased 83  . Heart attack Maternal Uncle   . Melanoma Maternal Uncle   . Breast cancer Paternal Aunt        dx in 19s  . Breast cancer Paternal Aunt        dx in 34s  . Cancer Brother 41       unk. type  . Breast cancer Other        Mat grandmother's 2 sisters; dx at 17 and 40  . Stomach cancer Paternal Grandfather 40       deceased 56s  . Breast cancer Cousin        daughter of pat aunt with breast ca    Social History Social History   Tobacco Use  . Smoking status: Never Smoker  . Smokeless tobacco: Never Used  Substance Use Topics  . Alcohol use: Yes    Alcohol/week: 0.0 standard drinks    Comment: occasionally wine  . Drug use: Yes    Types: Marijuana    Comment: cannabis with no extra  low dose edibles  for neuropathy    Review of Systems Constitutional: No fever/chills Eyes: No visual changes. ENT: No sore throat. Cardiovascular: Denies chest  pain. Respiratory: Positive for shortness of breath. Gastrointestinal: No abdominal pain.  Decreased oral intake.  Genitourinary: Negative for dysuria. Musculoskeletal: Negative for back pain. Skin: Negative for rash. Neurological: Negative for headaches, focal weakness or numbness.  ____________________________________________   PHYSICAL EXAM:  VITAL SIGNS: ED Triage Vitals  Enc Vitals Group     BP 03/03/18 1510 127/67     Pulse Rate 03/03/18 1510 (!) 107     Resp 03/03/18 1510 16     Temp 03/03/18 1510 98.3 F (36.8 C)     Temp Source 03/03/18 1510 Oral     SpO2 03/03/18 1510 100 %     Weight 03/03/18 1512 186 lb (84.4 kg)     Height 03/03/18 1512 5\' 3"  (1.6 m)     Head Circumference --      Peak Flow --      Pain Score 03/03/18 1511 0    Constitutional: Alert and oriented.  Eyes: Conjunctivae are normal.  ENT      Head: Normocephalic and atraumatic.      Nose: No congestion/rhinnorhea.      Mouth/Throat: Mucous membranes are moist.      Neck: No stridor. Hematological/Lymphatic/Immunilogical: No cervical lymphadenopathy. Cardiovascular: Normal rate, regular rhythm.  No murmurs, rubs, or gallops.  Respiratory: Normal respiratory effort without tachypnea nor retractions. Breath sounds are clear and equal bilaterally. No wheezes/rales/rhonchi. Gastrointestinal: Soft and non tender. No rebound. No guarding.  Genitourinary: Deferred Musculoskeletal: Normal range of motion in all extremities. No lower extremity edema. Neurologic:  Normal speech and language. No gross focal neurologic deficits are appreciated.  Skin:  Skin is warm, dry and intact. No rash noted. Psychiatric: Mood and affect are normal. Speech and behavior are normal. Patient exhibits appropriate insight and judgment.  ____________________________________________    LABS (pertinent positives/negatives)  CBC wbc 3.1, hgb 9.4, plt 325 Lactic 3.4- 2.1 UA clear, small hgb dipstick, 0-5 rbc and wbc,  negative nitrite and leukocytes  BMP na 140, k 3.9, glu 141, cr 1.19  ____________________________________________   EKG  I, Nance Pear, attending physician, personally viewed and interpreted this EKG  EKG Time: 1507 Rate: 109 Rhythm: sinus tachycardia Axis: left axis deviation Intervals: qtc 492 QRS: RBBB, LAFB ST changes: no st elevation Impression: abnormal ekg   ____________________________________________    RADIOLOGY  CXR No acute abnormality  ____________________________________________   PROCEDURES  Procedures  _____________________________________   INITIAL IMPRESSION / ASSESSMENT AND PLAN / ED COURSE  Pertinent labs & imaging results that were available during my care of the patient were reviewed by me and considered in my medical decision making (see chart for details).   Patient presented to the emergency department today because of concern for weakness and some shortness of breath over the past couple of days.  Patient does have a history of cancer and recently had chemotherapy.  Patient was initially tachycardic.  Patient was afebrile pallor concern for possible infection versus dehydration.  Blood work came back with a elevated lactate.  This point however no obvious source of infection.  Chest x-ray and urine were both negative.  Patient was given IV fluids and repeat lactic still elevated at 2.1.  This point do think a large part of this is likely dehydration however will cover with broad-spectrum antibiotics.  Discussed findings with patient.  Will plan on admission to the hospital service.  ____________________________________________   FINAL CLINICAL IMPRESSION(S) / ED DIAGNOSES  Final diagnoses:  Elevated lactic acid level  Weakness     Note: This dictation was prepared with Dragon dictation. Any transcriptional errors that result from this process are unintentional     Nance Pear, MD 03/03/18 2116

## 2018-03-03 NOTE — ED Triage Notes (Addendum)
Patient c/o shortness of breath that started Thursday and has continually gotten worse. Last chemo Wednesday. 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 A&O x4 in triage. Has power port

## 2018-03-04 ENCOUNTER — Telehealth: Payer: Self-pay

## 2018-03-04 ENCOUNTER — Other Ambulatory Visit: Payer: Self-pay

## 2018-03-04 ENCOUNTER — Observation Stay (HOSPITAL_BASED_OUTPATIENT_CLINIC_OR_DEPARTMENT_OTHER)
Admit: 2018-03-04 | Discharge: 2018-03-04 | Disposition: A | Discharge status: Home / Self Care | Payer: Medicare Other | Attending: Internal Medicine | Admitting: Internal Medicine

## 2018-03-04 DIAGNOSIS — R0602 Shortness of breath: Secondary | ICD-10-CM | POA: Diagnosis not present

## 2018-03-04 DIAGNOSIS — E119 Type 2 diabetes mellitus without complications: Secondary | ICD-10-CM | POA: Diagnosis not present

## 2018-03-04 DIAGNOSIS — R42 Dizziness and giddiness: Secondary | ICD-10-CM | POA: Diagnosis not present

## 2018-03-04 DIAGNOSIS — C50919 Malignant neoplasm of unspecified site of unspecified female breast: Secondary | ICD-10-CM | POA: Diagnosis not present

## 2018-03-04 DIAGNOSIS — I10 Essential (primary) hypertension: Secondary | ICD-10-CM | POA: Diagnosis not present

## 2018-03-04 LAB — GLUCOSE, CAPILLARY
Glucose-Capillary: 113 mg/dL — ABNORMAL HIGH (ref 70–99)
Glucose-Capillary: 119 mg/dL — ABNORMAL HIGH (ref 70–99)
Glucose-Capillary: 133 mg/dL — ABNORMAL HIGH (ref 70–99)
Glucose-Capillary: 137 mg/dL — ABNORMAL HIGH (ref 70–99)

## 2018-03-04 LAB — CBC
HCT: 24.4 % — ABNORMAL LOW (ref 36.0–46.0)
Hemoglobin: 7.5 g/dL — ABNORMAL LOW (ref 12.0–15.0)
MCH: 31.5 pg (ref 26.0–34.0)
MCHC: 30.7 g/dL (ref 30.0–36.0)
MCV: 102.5 fL — ABNORMAL HIGH (ref 80.0–100.0)
Platelets: 258 10*3/uL (ref 150–400)
RBC: 2.38 MIL/uL — ABNORMAL LOW (ref 3.87–5.11)
RDW: 15.5 % (ref 11.5–15.5)
WBC: 2.4 10*3/uL — ABNORMAL LOW (ref 4.0–10.5)
nRBC: 0 % (ref 0.0–0.2)

## 2018-03-04 LAB — BASIC METABOLIC PANEL
ANION GAP: 6 (ref 5–15)
BUN: 12 mg/dL (ref 8–23)
CO2: 20 mmol/L — ABNORMAL LOW (ref 22–32)
Calcium: 7.6 mg/dL — ABNORMAL LOW (ref 8.9–10.3)
Chloride: 112 mmol/L — ABNORMAL HIGH (ref 98–111)
Creatinine, Ser: 1.03 mg/dL — ABNORMAL HIGH (ref 0.44–1.00)
GFR calc Af Amer: >60 mL/min (ref >60)
GFR calc non Af Amer: 57 mL/min — ABNORMAL LOW (ref >60)
Glucose, Bld: 123 mg/dL — ABNORMAL HIGH (ref 70–99)
Potassium: 3.5 mmol/L (ref 3.5–5.1)
Sodium: 138 mmol/L (ref 135–145)

## 2018-03-04 MED ORDER — LORATADINE 10 MG PO TABS
10.0000 mg | ORAL_TABLET | Freq: Every day | ORAL | Status: DC
  Filled 2018-03-04: qty 1

## 2018-03-04 MED ORDER — SODIUM CHLORIDE (HYPERTONIC) 5 % OP SOLN
1.0000 [drp] | Freq: Four times a day (QID) | OPHTHALMIC | Status: DC | PRN
  Filled 2018-03-04: qty 15

## 2018-03-04 MED ORDER — PROCHLORPERAZINE MALEATE 10 MG PO TABS
10.0000 mg | ORAL_TABLET | Freq: Four times a day (QID) | ORAL | Status: DC | PRN
  Filled 2018-03-04: qty 1

## 2018-03-04 MED ORDER — GABAPENTIN 300 MG PO CAPS: 600.0000 mg | ORAL_CAPSULE | Freq: Every day | ORAL | Status: DC

## 2018-03-04 MED ORDER — VITAMIN B-12 1000 MCG PO TABS
5000.0000 ug | ORAL_TABLET | Freq: Every day | ORAL | Status: DC
  Administered 2018-03-04: 09:00:00 5000 ug via ORAL
  Filled 2018-03-04 (×2): qty 5

## 2018-03-04 MED ORDER — ZOLPIDEM TARTRATE 5 MG PO TABS: 5.0000 mg | ORAL_TABLET | Freq: Every evening | ORAL | Status: DC | PRN

## 2018-03-04 MED ORDER — OXYCODONE HCL 5 MG PO TABS: 10.0000 mg | ORAL_TABLET | ORAL | Status: DC | PRN

## 2018-03-04 MED ORDER — OXYCODONE HCL 5 MG PO TABS
5.0000 mg | ORAL_TABLET | Freq: Once | ORAL | Status: AC
  Administered 2018-03-04: 5 mg via ORAL
  Filled 2018-03-04: qty 1

## 2018-03-04 MED ORDER — HEPARIN SOD (PORK) LOCK FLUSH 100 UNIT/ML IV SOLN
INTRAVENOUS | Status: AC
  Administered 2018-03-04: 19:00:00
  Filled 2018-03-04: qty 5

## 2018-03-04 MED ORDER — BUPROPION HCL ER (XL) 150 MG PO TB24: 150.0000 mg | ORAL_TABLET | Freq: Every day | ORAL | Status: DC

## 2018-03-04 MED ORDER — ACETAMINOPHEN 500 MG PO TABS: 500.0000 mg | ORAL_TABLET | Freq: Four times a day (QID) | ORAL | Status: DC | PRN

## 2018-03-04 MED ORDER — GABAPENTIN 300 MG PO CAPS
300.0000 mg | ORAL_CAPSULE | Freq: Every morning | ORAL | Status: DC
  Administered 2018-03-04: 300 mg via ORAL
  Filled 2018-03-04: qty 1

## 2018-03-04 MED ORDER — PANTOPRAZOLE SODIUM 40 MG PO TBEC: 40.0000 mg | DELAYED_RELEASE_TABLET | Freq: Every day | ORAL | Status: DC

## 2018-03-04 MED ORDER — DOCUSATE SODIUM 100 MG PO CAPS: 100.0000 mg | ORAL_CAPSULE | Freq: Two times a day (BID) | ORAL | Status: DC | PRN

## 2018-03-04 MED ORDER — GABAPENTIN 300 MG PO CAPS: 300.0000 mg | ORAL_CAPSULE | ORAL | Status: DC

## 2018-03-04 MED ORDER — ATORVASTATIN CALCIUM 20 MG PO TABS: 10.0000 mg | ORAL_TABLET | Freq: Every day | ORAL | Status: DC

## 2018-03-04 MED ORDER — PAROXETINE HCL 20 MG PO TABS
40.0000 mg | ORAL_TABLET | Freq: Every day | ORAL | Status: DC
  Filled 2018-03-04: qty 2

## 2018-03-04 MED ORDER — HEPARIN SODIUM (PORCINE) 5000 UNIT/ML IJ SOLN
5000.0000 [IU] | Freq: Three times a day (TID) | INTRAMUSCULAR | Status: DC
  Administered 2018-03-04: 5000 [IU] via SUBCUTANEOUS
  Filled 2018-03-04: qty 1

## 2018-03-04 MED ORDER — FERROUS SULFATE 325 (65 FE) MG PO TABS
325.0000 mg | ORAL_TABLET | Freq: Every day | ORAL | Status: DC
  Administered 2018-03-04: 325 mg via ORAL
  Filled 2018-03-04 (×2): qty 1

## 2018-03-04 MED ORDER — CALCIUM CARBONATE-VITAMIN D 500-200 MG-UNIT PO TABS
ORAL_TABLET | Freq: Every day | ORAL | Status: DC
  Administered 2018-03-04: 09:00:00 1 via ORAL
  Filled 2018-03-04 (×2): qty 1

## 2018-03-04 NOTE — Progress Notes (Signed)
*  PRELIMINARY RESULTS* Echocardiogram 2D Echocardiogram has been performed.  Amanda [name] 03/04/2018, 4:44 PM

## 2018-03-04 NOTE — Telephone Encounter (Signed)
Patient called wanting to know if she should come in on Wednesday 03/06/2018 for her appointment since she wa just at the hospital yesterday for weakness and SOB. I told her that most probably that she would but that I would ask Dr. Grayland Ormond tomorrow morning and that I would call her back with an answer.

## 2018-03-04 NOTE — Discharge Instructions (Signed)
Resume diet and activity as before ° ° °

## 2018-03-04 NOTE — Care Management Obs Status (Signed)
[place] NOTIFICATION   Patient Details  Name: Amanda [name] MRN: [id number] Date of Birth: 1950-05-06   Medicare Observation Status Notification Given:  Yes    Katrina Stack, RN 03/04/2018, 9:06 AM

## 2018-03-04 NOTE — ED Notes (Signed)
ED TO INPATIENT HANDOFF REPORT  Name/Age/Gender Amanda [name] 68 y.o. female  Code Status    Code Status Orders  (From admission, onward)         Start     Ordered   03/04/18 0259  Full code  Continuous     03/04/18 0258        Code Status History    Date Active Date Inactive Code Status Order ID Comments User Context   11/03/2017 2110 11/06/2017 1826 Full Code [id number]  Saundra Shelling, MD Inpatient    Advance Directive Documentation     Most Recent Value  Type of Advance Directive  Living will, Healthcare Power of Attorney  Pre-existing out of facility DNR order (yellow form or pink MOST form)  -  "MOST" Form in Place?  -      Home/SNF/Other Home  Chief Complaint ca pt,shortness of breath  Level of Care/Admitting Diagnosis ED Disposition    ED Disposition Condition [place]: [place] [100120]  Level of Care: Med-Surg [16]  Diagnosis: SOB (shortness of breath) [947096]  Admitting Physician: Vaughan Basta [phone]  Attending Physician: Vaughan Basta [phone]  PT Class (Do Not Modify): Observation [104]  PT Acc Code (Do Not Modify): Observation [10022]       Medical History Past Medical History:  Diagnosis Date  . Anxiety   . Back pain    occasionally  . Breast cancer ([place]) 2009   left  . Depression    takes Paxil and Wellbutrin daily  . Diabetes ([place])    takes Metformin daily   type 2   . Genetic testing 02/03/2017   Multi-Cancer panel (83 genes) @ [facility] - No pathogenic mutations detected  . GERD (gastroesophageal reflux disease)    occasional  . History of bronchitis 2 yrs ago  . History of kidney stones   . Hyperlipidemia    takes Atorvastatin daily  . Hypertension    no meds  . Insomnia    takes Ambien nightly  . Insomnia    takes gabapentin nightly  . Joint pain   . Mood swings   . Osteoarthritis of knee   . Personal history of chemotherapy 12/05/2016   Mets from  Breast Cancer  . Personal history of radiation therapy 11/2016  . Pneumonia   . PONV (postoperative nausea and vomiting)   . Seasonal allergies    takes Allegra daily    Allergies Allergies  Allergen Reactions  . Ace Inhibitors Cough  . Latex Itching  . Morphine And Related Itching    Caused her to itch terribly. Would prefer if given to take with a benadryl  . Penicillins Rash    Has patient had a PCN reaction causing immediate rash, facial/tongue/throat swelling, SOB or lightheadedness with hypotension: No Has patient had a PCN reaction causing severe rash involving mucus membranes or skin necrosis: No Has patient had a PCN reaction that required hospitalization No Has patient had a PCN reaction occurring within the last 10 years: No If all of the above answers are "NO", then may proceed with Cephalosporin use.    IV Location/Drains/Wounds Patient Lines/Drains/Airways Status   Active Line/Drains/Airways    Name:   Placement date:   Placement time:   Site:   Days:   Implanted Port 09/17/15 Right Chest   09/17/15    -    Chest   899   Incision (Closed) 06/03/15 Shoulder Right   06/03/15  0938     1005   Incision (Closed) 09/17/15 Chest Right   09/17/15    1348     899          Labs/Imaging Results for orders placed or performed during the hospital encounter of 03/03/18 (from the past 48 hour(s))  Basic metabolic panel     Status: Abnormal   Collection Time: 03/03/18  3:17 PM  Result Value Ref Range   Sodium 140 135 - 145 mmol/L   Potassium 3.9 3.5 - 5.1 mmol/L   Chloride 109 98 - 111 mmol/L   CO2 20 (L) 22 - 32 mmol/L   Glucose, Bld 141 (H) 70 - 99 mg/dL   BUN 17 8 - 23 mg/dL   Creatinine, Ser 1.19 (H) 0.44 - 1.00 mg/dL   Calcium 8.7 (L) 8.9 - 10.3 mg/dL   GFR calc non Af Amer 48 (L) >60 mL/min   GFR calc Af Amer 55 (L) >60 mL/min   Anion gap 11 5 - 15    Comment: Performed at [facility], [address] [place], [place] [address]  CBC     Status:  Abnormal   Collection Time: 03/03/18  3:17 PM  Result Value Ref Range   WBC 3.1 (L) 4.0 - 10.5 K/uL   RBC 2.96 (L) 3.87 - 5.11 MIL/uL   Hemoglobin 9.4 (L) 12.0 - 15.0 g/dL   HCT 30.5 (L) 36.0 - 46.0 %   MCV 103.0 (H) 80.0 - 100.0 fL   MCH 31.8 26.0 - 34.0 pg   MCHC 30.8 30.0 - 36.0 g/dL   RDW 15.6 (H) 11.5 - 15.5 %   Platelets 325 150 - 400 K/uL   nRBC 0.0 0.0 - 0.2 %    Comment: Performed at [facility], [address] [place], [place] [address]  Troponin I - ONCE - STAT     Status: Abnormal   Collection Time: 03/03/18  3:17 PM  Result Value Ref Range   Troponin I 0.04 (HH) <0.03 ng/mL    Comment: CRITICAL RESULT CALLED TO, READ BACK BY AND VERIFIED WITH SUSAN NEAL AT 6789 03/03/2018.PMF Performed at [facility], [place], [place] [address]   Lactic acid, plasma     Status: Abnormal   Collection Time: 03/03/18  3:17 PM  Result Value Ref Range   Lactic Acid, Venous 3.4 (HH) 0.5 - 1.9 mmol/L    Comment: CRITICAL RESULT CALLED TO, READ BACK BY AND VERIFIED WITH SUSAN NEAL AT 7510 03/03/2018.PMF Performed at [facility], [address] [place], [place] [address]   CBC with Differential/Platelet     Status: Abnormal   Collection Time: 03/03/18  3:17 PM  Result Value Ref Range   WBC 3.1 (L) 4.0 - 10.5 K/uL   RBC 2.93 (L) 3.87 - 5.11 MIL/uL   Hemoglobin 9.4 (L) 12.0 - 15.0 g/dL   HCT 30.9 (L) 36.0 - 46.0 %   MCV 105.5 (H) 80.0 - 100.0 fL   MCH 32.1 26.0 - 34.0 pg   MCHC 30.4 30.0 - 36.0 g/dL   RDW 15.3 11.5 - 15.5 %   Platelets 335 150 - 400 K/uL   nRBC 0.0 0.0 - 0.2 %   Neutrophils Relative % 73 %   Neutro Abs 2.3 1.7 - 7.7 K/uL   Lymphocytes Relative 16 %   Lymphs Abs 0.5 (L) 0.7 - 4.0 K/uL   Monocytes Relative 7 %   Monocytes Absolute 0.2 0.1 - 1.0  K/uL   Eosinophils Relative 1 %   Eosinophils Absolute 0.0 0.0 - 0.5 K/uL   Basophils Relative 2 %   Basophils Absolute 0.1 0.0 - 0.1 K/uL   Smear Review Normal platelet  morphology    Immature Granulocytes 1 %   Abs Immature Granulocytes 0.02 0.00 - 0.07 K/uL   Dimorphism PRESENT    Ovalocytes PRESENT     Comment: Performed at [facility], [address] [place], [place] [address]  Urinalysis, Complete w Microscopic     Status: Abnormal   Collection Time: 03/03/18  6:34 PM  Result Value Ref Range   Color, Urine YELLOW (A) YELLOW   APPearance CLEAR (A) CLEAR   Specific Gravity, Urine 1.015 1.005 - 1.030   pH 6.0 5.0 - 8.0   Glucose, UA NEGATIVE NEGATIVE mg/dL   Hgb urine dipstick SMALL (A) NEGATIVE   Bilirubin Urine NEGATIVE NEGATIVE   Ketones, ur NEGATIVE NEGATIVE mg/dL   Protein, ur 30 (A) NEGATIVE mg/dL   Nitrite NEGATIVE NEGATIVE   Leukocytes,Ua NEGATIVE NEGATIVE   RBC / HPF 0-5 0 - 5 RBC/hpf   WBC, UA 0-5 0 - 5 WBC/hpf   Bacteria, UA NONE SEEN NONE SEEN   Squamous Epithelial / LPF 0-5 0 - 5   Mucus PRESENT     Comment: Performed at [facility], [place], [place] [address]  Lactic acid, plasma     Status: Abnormal   Collection Time: 03/03/18  6:35 PM  Result Value Ref Range   Lactic Acid, Venous 2.1 (HH) 0.5 - 1.9 mmol/L    Comment: CRITICAL RESULT CALLED TO, READ BACK BY AND VERIFIED WITH SUSAN NEAL RN AT 1905 03/03/2018. MSS Performed at [facility], [address] [place], [place] [address]   Blood culture (routine x 2)     Status: None (Preliminary result)   Collection Time: 03/03/18  6:35 PM  Result Value Ref Range   Specimen Description BLOOD RT AC    Special Requests      BOTTLES DRAWN AEROBIC AND ANAEROBIC Blood Culture adequate volume   Culture      NO GROWTH < 12 HOURS Performed at [facility], [address], [place], [place] [address]    Report Status PENDING   Blood culture (routine x 2)     Status: None (Preliminary result)   Collection Time: 03/03/18  6:43 PM  Result Value Ref Range   Specimen Description BLOOD RT PORT    Special Requests      BOTTLES DRAWN  AEROBIC AND ANAEROBIC Blood Culture adequate volume   Culture      NO GROWTH < 12 HOURS Performed at [facility], [address] [place], [place] [address]    Report Status PENDING   Glucose, capillary     Status: Abnormal   Collection Time: 03/04/18 12:58 AM  Result Value Ref Range   Glucose-Capillary 137 (H) 70 - 99 mg/dL  Basic metabolic panel     Status: Abnormal   Collection Time: 03/04/18  6:29 AM  Result Value Ref Range   Sodium 138 135 - 145 mmol/L   Potassium 3.5 3.5 - 5.1 mmol/L   Chloride 112 (H) 98 - 111 mmol/L   CO2 20 (L) 22 - 32 mmol/L   Glucose, Bld 123 (H) 70 - 99 mg/dL   BUN 12 8 - 23 mg/dL   Creatinine, Ser 1.03 (H) 0.44 - 1.00 mg/dL   Calcium 7.6 (L) 8.9 -  10.3 mg/dL   GFR calc non Af Amer 57 (L) >60 mL/min   GFR calc Af Amer >60 >60 mL/min   Anion gap 6 5 - 15    Comment: Performed at [facility], [address] [place], [place] [address]  CBC     Status: Abnormal   Collection Time: 03/04/18  6:29 AM  Result Value Ref Range   WBC 2.4 (L) 4.0 - 10.5 K/uL   RBC 2.38 (L) 3.87 - 5.11 MIL/uL   Hemoglobin 7.5 (L) 12.0 - 15.0 g/dL   HCT 24.4 (L) 36.0 - 46.0 %   MCV 102.5 (H) 80.0 - 100.0 fL   MCH 31.5 26.0 - 34.0 pg   MCHC 30.7 30.0 - 36.0 g/dL   RDW 15.5 11.5 - 15.5 %   Platelets 258 150 - 400 K/uL   nRBC 0.0 0.0 - 0.2 %    Comment: Performed at [facility], [address] [place], [place] [address]   Dg Chest 2 View  Result Date: 03/03/2018 CLINICAL DATA:  Three-day history of gradually worsening shortness of breath. Current history of metastatic inflammatory breast cancer for which the patient received her most recent chemotherapy on 02/27/2018. EXAM: CHEST - 2 VIEW COMPARISON:  None. FINDINGS: AP ERECT and LATERAL images were obtained. Cardiac silhouette upper normal in size for AP technique. Thoracic aorta minimally atherosclerotic, unchanged. Hilar and mediastinal contours otherwise unremarkable. RIGHT jugular  Port-A-Cath tip at the cavoatrial junction, unchanged. Lungs clear. Bronchovascular markings normal. Pulmonary vascularity normal. No visible pleural effusions. No pneumothorax. Prior RIGHT shoulder hemiarthroplasty with anatomic alignment. Remote healed BILATERAL rib fractures. IMPRESSION: No acute cardiopulmonary disease. Electronically Signed   By: Evangeline Dakin M.D.   On: 03/03/2018 16:54   Ct Angio Chest Pe W Or Wo Contrast  Result Date: 03/03/2018 CLINICAL DATA:  68 year old female with shortness of breath. Metastatic breast cancer, recently started on New chemotherapy. EXAM: CT ANGIOGRAPHY CHEST WITH CONTRAST TECHNIQUE: Multidetector CT imaging of the chest was performed using the standard protocol during bolus administration of intravenous contrast. Multiplanar CT image reconstructions and MIPs were obtained to evaluate the vascular anatomy. CONTRAST:  65mL OMNIPAQUE IOHEXOL 350 MG/ML SOLN COMPARISON:  Chest radiographs earlier today. CT Abdomen and Pelvis 02/20/2018. Chest CTA 11/03/2017. FINDINGS: Cardiovascular: Adequate contrast bolus timing in the pulmonary arterial tree. No focal filling defect identified in the pulmonary arteries to suggest acute pulmonary embolism. Calcified coronary artery atherosclerosis. Negative visible aorta aside from mild calcified plaque in the upper abdomen. Stable mild cardiomegaly. No pericardial effusion. Right chest porta cath. Mediastinum/Nodes: Negative, no lymphadenopathy. Lungs/Pleura: Small or trace layering left pleural effusion. Chronic scarring in the anterior left upper lobe, perhaps radiation related. Peribronchial ground-glass opacity elsewhere seen in October has virtually resolved. The trachea and major airways are patent. No pulmonary consolidation. No new abnormal pulmonary opacity. Upper Abdomen: Stable elevation of the right hemidiaphragm. Heterogeneous enhancement of the liver was better demonstrated on 02/20/2018. The visible spleen, pancreas,  adrenal glands, left kidney remain normal. Small gastric hiatal hernia. Negative visible bowel. Musculoskeletal: Stable left chest wall since October. Widespread skeletal metastases. Superimposed right shoulder arthroplasty. No pathologic fracture identified. Review of the MIP images confirms the above findings. IMPRESSION: 1.  Negative for acute pulmonary embolus. 2. Trace layering left pleural effusion but no other acute findings in the chest. 3. Widespread skeletal and hepatic metastatic disease. Electronically Signed   By: Genevie Ann M.D.   On: 03/03/2018 21:44    Pending Labs Unresulted  Labs (From admission, onward)   None      Vitals/Pain Today's Vitals   03/04/18 0400 03/04/18 0415 03/04/18 0630 03/04/18 0733  BP:    (!) 147/65  Pulse: 90 92  81  Resp:    17  Temp:    99.4 F (37.4 C)  TempSrc:    Oral  SpO2: (!) 89% 91%  98%  Weight:      Height:      PainSc:   0-No pain     Isolation Precautions No active isolations  Medications Medications  acetaminophen (TYLENOL) tablet 500 mg (has no administration in time range)  oxyCODONE (Oxy IR/ROXICODONE) immediate release tablet 10 mg (has no administration in time range)  atorvastatin (LIPITOR) tablet 10 mg (has no administration in time range)  buPROPion (WELLBUTRIN XL) 24 hr tablet 150 mg (has no administration in time range)  PARoxetine (PAXIL) tablet 40 mg (has no administration in time range)  prochlorperazine (COMPAZINE) tablet 10 mg (has no administration in time range)  zolpidem (AMBIEN) tablet 5 mg (has no administration in time range)  pantoprazole (PROTONIX) EC tablet 40 mg (has no administration in time range)  vitamin B-12 (CYANOCOBALAMIN) tablet 5,000 mcg (has no administration in time range)  ferrous sulfate tablet 325 mg (has no administration in time range)  gabapentin (NEURONTIN) capsule 300-600 mg (has no administration in time range)  calcium-vitamin D (OSCAL WITH D) 500-200 MG-UNIT per tablet (has no  administration in time range)  loratadine (CLARITIN) tablet 10 mg (has no administration in time range)  sodium chloride (MURO 128) 5 % ophthalmic solution 1 drop (has no administration in time range)  docusate sodium (COLACE) capsule 100 mg (has no administration in time range)  heparin injection 5,000 Units (5,000 Units Subcutaneous Given 03/04/18 0639)  0.9 %  sodium chloride infusion ( Intravenous Stopped 03/04/18 0217)  insulin aspart (novoLOG) injection 0-9 Units (has no administration in time range)  insulin aspart (novoLOG) injection 0-5 Units (0 Units Subcutaneous Not Given 03/04/18 0059)  sodium chloride 0.9 % bolus 1,000 mL (0 mLs Intravenous Stopped 03/03/18 1834)  oxyCODONE (Oxy IR/ROXICODONE) immediate release tablet 5 mg (5 mg Oral Given 03/03/18 1820)  vancomycin (VANCOCIN) IVPB 1000 mg/200 mL premix (0 mg Intravenous Stopped 03/04/18 0100)  ceFEPIme (MAXIPIME) 2 g in sodium chloride 0.9 % 100 mL IVPB (0 g Intravenous Stopped 03/04/18 0100)  iohexol (OMNIPAQUE) 350 MG/ML injection 75 mL (75 mLs Intravenous Contrast Given 03/03/18 2103)  oxyCODONE (Oxy IR/ROXICODONE) immediate release tablet 5 mg (5 mg Oral Given 03/04/18 0304)    Mobility walks

## 2018-03-04 NOTE — Telephone Encounter (Signed)
Yes she can.

## 2018-03-04 NOTE — ED Notes (Signed)
Pt unlabored, alert and oriented. NAD.

## 2018-03-04 NOTE — Progress Notes (Signed)
Pt d/c to home with friend. Port deaccessed with heparin flush infused. VSS. Education completed. All questions answered. Belongings sent with pt.

## 2018-03-05 LAB — ECHOCARDIOGRAM COMPLETE
Height: 63 in
Weight: 2976 oz

## 2018-03-05 NOTE — Telephone Encounter (Signed)
Patient was contacted yesterday letting her know that she needed to come in tomorrow for her appointment with Dr. Grayland Ormond.

## 2018-03-06 ENCOUNTER — Inpatient Hospital Stay (HOSPITAL_BASED_OUTPATIENT_CLINIC_OR_DEPARTMENT_OTHER): Payer: Medicare Other | Admitting: Oncology

## 2018-03-06 ENCOUNTER — Other Ambulatory Visit: Payer: Self-pay

## 2018-03-06 ENCOUNTER — Inpatient Hospital Stay: Payer: Medicare Other | Admitting: Hospice and Palliative Medicine

## 2018-03-06 ENCOUNTER — Inpatient Hospital Stay: Payer: Medicare Other

## 2018-03-06 VITALS — BP 120/77 | HR 103 | Temp 98.0°F | Ht 63.0 in | Wt 180.3 lb

## 2018-03-06 DIAGNOSIS — I82C12 Acute embolism and thrombosis of left internal jugular vein: Secondary | ICD-10-CM

## 2018-03-06 DIAGNOSIS — M8580 Other specified disorders of bone density and structure, unspecified site: Secondary | ICD-10-CM

## 2018-03-06 DIAGNOSIS — N289 Disorder of kidney and ureter, unspecified: Secondary | ICD-10-CM

## 2018-03-06 DIAGNOSIS — R531 Weakness: Secondary | ICD-10-CM | POA: Diagnosis not present

## 2018-03-06 DIAGNOSIS — C50919 Malignant neoplasm of unspecified site of unspecified female breast: Secondary | ICD-10-CM

## 2018-03-06 DIAGNOSIS — G620 Drug-induced polyneuropathy: Secondary | ICD-10-CM | POA: Diagnosis not present

## 2018-03-06 DIAGNOSIS — Z170 Estrogen receptor positive status [ER+]: Secondary | ICD-10-CM | POA: Diagnosis not present

## 2018-03-06 DIAGNOSIS — D649 Anemia, unspecified: Secondary | ICD-10-CM

## 2018-03-06 DIAGNOSIS — D709 Neutropenia, unspecified: Secondary | ICD-10-CM

## 2018-03-06 DIAGNOSIS — K769 Liver disease, unspecified: Secondary | ICD-10-CM | POA: Diagnosis not present

## 2018-03-06 DIAGNOSIS — C50912 Malignant neoplasm of unspecified site of left female breast: Secondary | ICD-10-CM | POA: Diagnosis not present

## 2018-03-06 DIAGNOSIS — R5383 Other fatigue: Secondary | ICD-10-CM

## 2018-03-06 DIAGNOSIS — R7989 Other specified abnormal findings of blood chemistry: Secondary | ICD-10-CM | POA: Diagnosis not present

## 2018-03-06 DIAGNOSIS — C7951 Secondary malignant neoplasm of bone: Secondary | ICD-10-CM

## 2018-03-06 DIAGNOSIS — Z5111 Encounter for antineoplastic chemotherapy: Secondary | ICD-10-CM | POA: Diagnosis not present

## 2018-03-06 DIAGNOSIS — C787 Secondary malignant neoplasm of liver and intrahepatic bile duct: Secondary | ICD-10-CM | POA: Diagnosis not present

## 2018-03-06 LAB — COMPREHENSIVE METABOLIC PANEL
ALT: 54 U/L — AB (ref 0–44)
AST: 241 U/L — AB (ref 15–41)
Albumin: 2.9 g/dL — ABNORMAL LOW (ref 3.5–5.0)
Alkaline Phosphatase: 217 U/L — ABNORMAL HIGH (ref 38–126)
Anion gap: 10 (ref 5–15)
BUN: 10 mg/dL (ref 8–23)
CO2: 20 mmol/L — ABNORMAL LOW (ref 22–32)
CREATININE: 1.27 mg/dL — AB (ref 0.44–1.00)
Calcium: 8.9 mg/dL (ref 8.9–10.3)
Chloride: 108 mmol/L (ref 98–111)
GFR calc Af Amer: 51 mL/min — ABNORMAL LOW (ref >60)
GFR calc non Af Amer: 44 mL/min — ABNORMAL LOW (ref >60)
Glucose, Bld: 188 mg/dL — ABNORMAL HIGH (ref 70–99)
Potassium: 3.6 mmol/L (ref 3.5–5.1)
Sodium: 138 mmol/L (ref 135–145)
Total Bilirubin: 0.7 mg/dL (ref 0.3–1.2)
Total Protein: 5.8 g/dL — ABNORMAL LOW (ref 6.5–8.1)

## 2018-03-06 LAB — CBC WITH DIFFERENTIAL/PLATELET
Abs Immature Granulocytes: 0 10*3/uL (ref 0.00–0.07)
Basophils Absolute: 0 10*3/uL (ref 0.0–0.1)
Basophils Relative: 1 %
Eosinophils Absolute: 0.1 10*3/uL (ref 0.0–0.5)
Eosinophils Relative: 5 %
HCT: 25.8 % — ABNORMAL LOW (ref 36.0–46.0)
Hemoglobin: 8 g/dL — ABNORMAL LOW (ref 12.0–15.0)
Lymphocytes Relative: 44 %
Lymphs Abs: 0.5 10*3/uL — ABNORMAL LOW (ref 0.7–4.0)
MCH: 31.5 pg (ref 26.0–34.0)
MCHC: 31 g/dL (ref 30.0–36.0)
MCV: 101.6 fL — ABNORMAL HIGH (ref 80.0–100.0)
Monocytes Absolute: 0.5 10*3/uL (ref 0.1–1.0)
Monocytes Relative: 43 %
Neutro Abs: 0.1 10*3/uL — ABNORMAL LOW (ref 1.7–7.7)
Neutrophils Relative %: 7 %
Platelets: 315 10*3/uL (ref 150–400)
RBC: 2.54 MIL/uL — ABNORMAL LOW (ref 3.87–5.11)
RDW: 15.6 % — AB (ref 11.5–15.5)
Smear Review: ADEQUATE
WBC: 1.2 10*3/uL — AB (ref 4.0–10.5)
nRBC: 9.5 % — ABNORMAL HIGH (ref 0.0–0.2)

## 2018-03-06 LAB — SAMPLE TO BLOOD BANK

## 2018-03-06 MED ORDER — DEXAMETHASONE SODIUM PHOSPHATE 10 MG/ML IJ SOLN
10.0000 mg | Freq: Once | INTRAMUSCULAR | Status: AC
  Administered 2018-03-06: 10 mg via INTRAVENOUS
  Filled 2018-03-06: qty 1

## 2018-03-06 MED ORDER — SODIUM CHLORIDE 0.9 % IV SOLN: 10.0000 mg | Freq: Once | INTRAVENOUS | Status: DC

## 2018-03-06 MED ORDER — HEPARIN SOD (PORK) LOCK FLUSH 100 UNIT/ML IV SOLN
INTRAVENOUS | Status: AC
  Filled 2018-03-06: qty 5

## 2018-03-06 MED ORDER — HEPARIN SOD (PORK) LOCK FLUSH 100 UNIT/ML IV SOLN
500.0000 [IU] | Freq: Once | INTRAVENOUS | Status: AC
  Administered 2018-03-06: 500 [IU] via INTRAVENOUS

## 2018-03-06 MED ORDER — SODIUM CHLORIDE 0.9 % IV SOLN
Freq: Once | INTRAVENOUS | Status: AC
  Administered 2018-03-06: 10:00:00 via INTRAVENOUS
  Filled 2018-03-06: qty 250

## 2018-03-06 NOTE — Progress Notes (Signed)
Patient is here today to follow up on her metastatic breast cancer. Patient stated that she was at the hospital on 02/16-17/2020 for SOB. She stated that she had been feeling better. When patient's temperature was taken today, she stated that she had a fever because her reading is always 96.0 Farenheit and today's reading was 98.0 Farenheit.

## 2018-03-08 LAB — CULTURE, BLOOD (ROUTINE X 2)
Culture: NO GROWTH
Culture: NO GROWTH
Special Requests: ADEQUATE
Special Requests: ADEQUATE

## 2018-03-10 NOTE — Progress Notes (Signed)
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  INTERVAL HISTORY: Patient returns to clinic today for further evaluation and reconsideration of Halaven.  She continues to have increased weakness and fatigue and decreased performance status.  She has a poor appetite. She has a chronic peripheral neuropathy, but no other neurologic complaints.  She denies any fevers.  She denies any chest pain, cough, or hemoptysis. She denies any nausea, vomiting, constipation, or diarrhea. She has no urinary complaints.  Patient feels generally terrible, but offers no further specific complaints today.  REVIEW OF SYSTEMS:   Review of Systems  Constitutional: Positive for malaise/fatigue. Negative for fever and weight loss.  Eyes: Negative.  Negative for blurred vision, double vision and pain.  Respiratory: Negative.  Negative for cough and shortness of breath.   Cardiovascular: Negative.  Negative for chest pain and leg swelling.  Gastrointestinal: Negative.  Negative for abdominal pain.  Genitourinary: Negative.  Negative for dysuria and flank pain.  Musculoskeletal: Negative for back pain and falls.  Skin: Negative.  Negative for rash.  Neurological: Positive for tingling, sensory change and weakness. Negative for focal weakness and headaches.  Endo/Heme/Allergies: Negative.   Psychiatric/Behavioral: Negative.  Negative for memory loss. The patient is not nervous/anxious.     As per HPI. Otherwise, a complete review of systems is negative.  PAST MEDICAL HISTORY: Past Medical History:  Diagnosis Date  . Anxiety   . Back pain    occasionally  . Breast cancer  ([place]) 2009   left  . Depression    takes Paxil and Wellbutrin daily  . Diabetes ([place])    takes Metformin daily   type 2   . Genetic testing 02/03/2017   Multi-Cancer panel (83 genes) @ [facility] - No pathogenic mutations detected  . GERD (gastroesophageal reflux disease)    occasional  . History of bronchitis 2 yrs ago  . History of kidney stones   . Hyperlipidemia    takes Atorvastatin daily  . Hypertension    no meds  . Insomnia    takes Ambien nightly  . Insomnia    takes gabapentin nightly  . Joint pain   . Mood swings   . Osteoarthritis of knee   . Personal history of chemotherapy 12/05/2016   Mets from Breast Cancer  . Personal history of radiation therapy 11/2016  . Pneumonia   . PONV (postoperative nausea and vomiting)   . Seasonal allergies    takes Allegra daily    PAST SURGICAL HISTORY: Past Surgical History:  Procedure Laterality Date  . BREAST BIOPSY  2009  . cataract surgery Bilateral   . COLONOSCOPY    . IR RADIOLOGIST EVAL & MGMT  01/29/2018  . JOINT REPLACEMENT Left 2014   knee  . KNEE ARTHROSCOPY Left    x 5  . MASTECTOMY Left   . port a cath placed    . PORTACATH PLACEMENT N/A 09/17/2015   Procedure: INSERTION PORT-A-CATH;  Surgeon: Jules Husbands, MD;  Location: [facility];  Service: General;  Laterality: N/A;  . RADIOLOGY WITH ANESTHESIA N/A 02/20/2018   Procedure: CT WITH ANESTHESIA MICROWAVE THERMAL ABLATION-LIVER;  Surgeon: Jacqulynn Cadet, MD;  Location: [facility];  Service: Anesthesiology;  Laterality: N/A;  . TOOTH EXTRACTION    . TOTAL SHOULDER ARTHROPLASTY Right 06/03/2015   Procedure: TOTAL SHOULDER ARTHROPLASTY;  Surgeon: Tania Ade, MD;  Location: [facility];  Service: Orthopedics;  Laterality: Right;  Right total shoulder arthroplasty  . TOTAL SHOULDER REPLACEMENT Right 06/03/2015  . TUBAL LIGATION      FAMILY HISTORY: Father with non-Hodgkin's lymphoma, 2 paternal aunts with breast cancer.     ADVANCED DIRECTIVES:    HEALTH  MAINTENANCE: Social History   Tobacco Use  . Smoking status: Never Smoker  . Smokeless tobacco: Never Used  Substance Use Topics  . Alcohol use: Yes    Alcohol/week: 0.0 standard drinks    Comment: occasionally wine  . Drug use: Yes    Types: Marijuana    Comment: cannabis with no extra  low dose edibles  for neuropathy     Colonoscopy:  PAP:  Bone density:  Lipid panel:  Allergies  Allergen Reactions  . Ace Inhibitors Cough  . Latex Itching  . Morphine And Related Itching    Caused her to itch terribly. Would prefer if given to take with a benadryl  . Penicillins Rash    Has patient had a PCN reaction causing immediate rash, facial/tongue/throat swelling, SOB or lightheadedness with hypotension: No Has patient had a PCN reaction causing severe rash involving mucus membranes or skin necrosis: No Has patient had a PCN reaction that required hospitalization No Has patient had a PCN reaction occurring within the last 10 years: No If all of the above answers are "NO", then may proceed with Cephalosporin use.    Current Outpatient Medications  Medication Sig Dispense Refill  . acetaminophen (TYLENOL) 500 MG tablet Take 500 mg by mouth every 6 (six) hours as needed for mild pain or moderate pain.     Marland Kitchen atorvastatin (LIPITOR) 10 MG tablet Take 10 mg by mouth at bedtime.     Marland Kitchen buPROPion (WELLBUTRIN XL) 150 MG 24 hr tablet Take 150 mg by mouth at bedtime.     . Calcium-Magnesium-Vitamin D (CALCIUM 1200+D3 PO) Take 1 tablet by mouth daily.     . Cyanocobalamin 5000 MCG CAPS Take 5,000 mcg by mouth daily.     . ferrous sulfate 325 (65 FE) MG EC tablet Take 325 mg by mouth daily.     . fexofenadine (ALLEGRA) 180 MG tablet Take 180 mg by mouth at bedtime.     . gabapentin (NEURONTIN) 300 MG capsule Take 300-600 mg by mouth See admin instructions. Take 300 mg by mouth in the morning and 600 mg at night    . lidocaine-prilocaine (EMLA) cream Apply 1 application topically as needed. Apply  to port 1-2 hours prior to chemotherapy appointment. Cover with plastic wrap. 30 g 0  . metFORMIN (GLUCOPHAGE) 850 MG tablet Take 850 mg by mouth 2 (two) times daily with a meal.     . Oxycodone HCl 10 MG TABS Take 1 tablet (10 mg total) by mouth every 4 (four) hours as needed. Ok to take every 4-6 hours as needed. 90 tablet 0  . pantoprazole (PROTONIX) 40 MG tablet Take 40 mg by mouth at bedtime.     Marland Kitchen PARoxetine (PAXIL) 40 MG tablet Take 40 mg by mouth at bedtime.     . prochlorperazine (COMPAZINE) 10 MG tablet Take 1 tablet (10 mg total) by mouth every 6 (six) hours as needed for nausea or vomiting. 30 tablet 3  . sodium chloride (MURO 128) 5 % ophthalmic solution Place  1 drop into both eyes 4 (four) times daily.    Marland Kitchen zolpidem (AMBIEN) 10 MG tablet Take 10 mg by mouth at bedtime as needed (sleep).     . megestrol (MEGACE) 20 MG tablet Take 1 tablet (20 mg total) by mouth daily. 30 tablet 0   No current facility-administered medications for this visit.     OBJECTIVE: Vitals:   03/13/18 0842 03/13/18 0850  BP:  99/68  Pulse:  98  Resp: 16   Temp:  (!) 97 F (36.1 C)     Body mass index is 31.27 kg/m.    ECOG FS:3 - Symptomatic, >50% confined to bed  General: Ill-appearing, no acute distress.  Sitting in a wheelchair. Eyes: Pink conjunctiva, anicteric sclera. HEENT: Normocephalic, moist mucous membranes. Lungs: Clear to auscultation bilaterally. Heart: Regular rate and rhythm. No rubs, murmurs, or gallops. Abdomen: Soft, nontender, nondistended. No organomegaly noted, normoactive bowel sounds. Musculoskeletal: No edema, cyanosis, or clubbing. Neuro: Alert, answering all questions appropriately. Cranial nerves grossly intact. Skin: No rashes or petechiae noted. Psych: Normal affect.  LAB RESULTS:  Lab Results  Component Value Date   NA 138 03/13/2018   K 4.0 03/13/2018   CL 106 03/13/2018   CO2 24 03/13/2018   GLUCOSE 149 (H) 03/13/2018   BUN 15 03/13/2018   CREATININE  1.26 (H) 03/13/2018   CALCIUM 9.0 03/13/2018   PROT 5.8 (L) 03/13/2018   ALBUMIN 2.9 (L) 03/13/2018   AST 151 (H) 03/13/2018   ALT 51 (H) 03/13/2018   ALKPHOS 253 (H) 03/13/2018   BILITOT 0.8 03/13/2018   GFRNONAA 44 (L) 03/13/2018   GFRAA 51 (L) 03/13/2018    Lab Results  Component Value Date   WBC 5.0 03/13/2018   NEUTROABS 2.4 03/13/2018   HGB 8.7 (L) 03/13/2018   HCT 28.1 (L) 03/13/2018   MCV 101.1 (H) 03/13/2018   PLT 273 03/13/2018     STUDIES: Dg Chest 2 View  Result Date: 03/03/2018 CLINICAL DATA:  Three-day history of gradually worsening shortness of breath. Current history of metastatic inflammatory breast cancer for which the patient received her most recent chemotherapy on 02/27/2018. EXAM: CHEST - 2 VIEW COMPARISON:  None. FINDINGS: AP ERECT and LATERAL images were obtained. Cardiac silhouette upper normal in size for AP technique. Thoracic aorta minimally atherosclerotic, unchanged. Hilar and mediastinal contours otherwise unremarkable. RIGHT jugular Port-A-Cath tip at the cavoatrial junction, unchanged. Lungs clear. Bronchovascular markings normal. Pulmonary vascularity normal. No visible pleural effusions. No pneumothorax. Prior RIGHT shoulder hemiarthroplasty with anatomic alignment. Remote healed BILATERAL rib fractures. IMPRESSION: No acute cardiopulmonary disease. Electronically Signed   By: Evangeline Dakin M.D.   On: 03/03/2018 16:54   Ct Angio Chest Pe W Or Wo Contrast  Result Date: 03/03/2018 CLINICAL DATA:  68 year old female with shortness of breath. Metastatic breast cancer, recently started on New chemotherapy. EXAM: CT ANGIOGRAPHY CHEST WITH CONTRAST TECHNIQUE: Multidetector CT imaging of the chest was performed using the standard protocol during bolus administration of intravenous contrast. Multiplanar CT image reconstructions and MIPs were obtained to evaluate the vascular anatomy. CONTRAST:  83m OMNIPAQUE IOHEXOL 350 MG/ML SOLN COMPARISON:  Chest  radiographs earlier today. CT Abdomen and Pelvis 02/20/2018. Chest CTA 11/03/2017. FINDINGS: Cardiovascular: Adequate contrast bolus timing in the pulmonary arterial tree. No focal filling defect identified in the pulmonary arteries to suggest acute pulmonary embolism. Calcified coronary artery atherosclerosis. Negative visible aorta aside from mild calcified plaque in the upper abdomen. Stable mild cardiomegaly. No pericardial effusion. Right chest porta cath.  Mediastinum/Nodes: Negative, no lymphadenopathy. Lungs/Pleura: Small or trace layering left pleural effusion. Chronic scarring in the anterior left upper lobe, perhaps radiation related. Peribronchial ground-glass opacity elsewhere seen in October has virtually resolved. The trachea and major airways are patent. No pulmonary consolidation. No new abnormal pulmonary opacity. Upper Abdomen: Stable elevation of the right hemidiaphragm. Heterogeneous enhancement of the liver was better demonstrated on 02/20/2018. The visible spleen, pancreas, adrenal glands, left kidney remain normal. Small gastric hiatal hernia. Negative visible bowel. Musculoskeletal: Stable left chest wall since October. Widespread skeletal metastases. Superimposed right shoulder arthroplasty. No pathologic fracture identified. Review of the MIP images confirms the above findings. IMPRESSION: 1.  Negative for acute pulmonary embolus. 2. Trace layering left pleural effusion but no other acute findings in the chest. 3. Widespread skeletal and hepatic metastatic disease. Electronically Signed   By: Genevie Ann M.D.   On: 03/03/2018 21:44   Mr Liver W XM Contrast  Result Date: 02/22/2018 CLINICAL DATA:  Metastatic breast cancer.  Followup liver lesions. EXAM: MRI ABDOMEN WITHOUT AND WITH CONTRAST TECHNIQUE: Multiplanar multisequence MR imaging of the abdomen was performed both before and after the administration of intravenous contrast. CONTRAST:  8 cc Gadavist. COMPARISON:  CT AP 12/18/2017  FINDINGS: Lower chest: Small bilateral pleural effusions appear increased from previous exam. Hepatobiliary: There are innumerable liver lesions identified throughout both lobes of liver compatible with widespread hepatic metastasis. These lesions are too numerous to count and are seen thoughout both lobes. This demonstrates a significant interval progression when compared with 12/18/17. A few sample lesions as follows: -large confluent mass within the dome of liver involving segments 4 and segment 2 measures 11.6 by 4.1 by 2.6 cm. New from previous exam. -the previous index lesion within segment 5 measures 4.0 by 3.9 cm, image 20/9. Previously 2.0 x 1.1 cm. -new lesion along the dome of segment 8 measures 1.8 x 1.8 cm, image 6/9. -new lesion within segment 6 measures 1.9 x 1.9 cm, image 28/9. Mild edema involving the wall of gallbladder.  Nonspecific. Pancreas: No mass, inflammatory changes, or other parenchymal abnormality identified. Spleen:  Within normal limits in size and appearance. Adrenals/Urinary Tract: Normal appearance of the adrenal glands. There is no kidney mass or hydronephrosis identified bilaterally. Mild bilateral renal cortical thinning identified. Stomach/Bowel: Visualized portions within the abdomen are unremarkable. Vascular/Lymphatic: Aortic atherosclerosis. No aneurysm. The portal vein remains patent. No significant adenopathy Other:  Small volume of perihepatic ascites is identified. Musculoskeletal: Multifocal bone metastases are identified. IMPRESSION: 1. Marked interval progression of liver metastases. 2. Increase in volume of pleural effusions. 3. Perihepatic ascites 4. Diffuse bone metastasis Electronically Signed   By: Kerby Moors M.D.   On: 02/22/2018 09:46   Ct Abdomen W Wo Contrast  Result Date: 02/20/2018 CLINICAL DATA:  68 year old female with a history of stage IV metastatic breast cancer with a solitary liver metastasis. She presents today for CT-guided percutaneous  thermal ablation. EXAM: CT ABDOMEN WITHOUT AND WITH CONTRAST TECHNIQUE: Multidetector CT imaging of the abdomen was performed following the standard protocol before and following the bolus administration of intravenous contrast. CONTRAST:  167m ISOVUE-370 IOPAMIDOL (ISOVUE-370) INJECTION 76% COMPARISON:  Prior CT scan of the abdomen and pelvis 12/18/2017 FINDINGS: Lower chest: Dependent atelectasis in both lower lobes with small bilateral pleural effusions. Mild cardiomegaly with a small pericardial effusion. The intracardiac blood pool is hypodense relative to the adjacent myocardium consistent with anemia. Hepatobiliary: The liver remains similar in size and configuration although there is now some slight  nodularity of the hepatic contour which was not definitively appreciated on prior imaging. The liver is diffusely heterogeneous on the precontrast images. Following administration of arterial contrast, there is an extremely heterogeneous perfusion pattern of the liver with multifocal areas of hypoenhancement, some of which is confluent in nature. The previously identified lesion in hepatic segment 5 has enlarged and now measures approximately 2.9 x 1.8 cm. On the portal venous phase imaging, the liver remains extremely heterogeneous in appearance. The known lesion in segment 5 remains similar in size. There are other masslike areas of relatively low attenuation which are more amorphous and difficult to measure discretely. The gallbladder is contracted. No biliary ductal dilatation. Pancreas: Unremarkable. No pancreatic ductal dilatation or surrounding inflammatory changes. Spleen: Normal in size without focal abnormality. Adrenals/Urinary Tract: Normal adrenal glands. No evidence of hydronephrosis or enhancing renal lesion. Parapelvic renal sinus cysts noted on the left. Nonobstructing punctate stone in the right lower pole collecting system, unchanged. Stomach/Bowel: No focal bowel wall thickening or evidence  of obstruction. Vascular/Lymphatic: Calcifications present in the abdominal aorta. Other: No abdominal wall hernia or abnormality. Musculoskeletal: Multifocal sclerotic osseous lesions again noted consistent with known osseous metastatic disease. Similar appearance of left mastectomy surgical site with small residual seroma. No significant interval change. IMPRESSION: Unfortunately, there is been interval development of extreme heterogeneity of the hepatic parenchyma over the relatively short interval compared to the prior CT scan dated 12/18/2017. Correlation with intra procedural ultrasound imaging demonstrates multifocal hypoechoic solid masses throughout the liver several of which have a more infiltrative appearance. The overall imaging appearance is most consistent with significant interval progression of hepatic metastatic disease much of which now has a diffusely infiltrative appearance. The only other non malignant possibility would be sudden interval development of extensive masslike fatty infiltration which would be atypical over such a short interval. If further imaging is clinically warranted, MRI of the abdomen with and without gadolinium contrast could confirm. Electronically Signed   By: Jacqulynn Cadet M.D.   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  PLAN:    1.  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  Patient wishes to continue with palliative treatment.  She has discontinued Ibrance and letrozole.  Will delay palliative Halaven once again secondary to decreased performance status. Patient will continue to receive Zometa on day 1 of each treatment.  Patient will return to clinic in 1 week for further evaluation and reconsideration of  treatment.  Given her difficulty with treatment, Halaven has been dose reduced.   2.  Osteopenia: Patient's bone mineral density on January 26, 2016 reported a T score of -0.9 which is considered normal. Continue calcium and vitamin D.  Zometa as above.   3.  Anemia: Patient's hemoglobin slightly improved to 8.7, monitor. 4. Peripheral neuropathy: Chronic and unchanged.  Continue gabapentin as prescribed. Neuropathy managed by neurology.  5. Back pain: MRI results from May 04, 2017 did not reveal metastatic disease.  Continue symptomatic treatment.  6.  Left IJ clot: Ultrasound results reviewed independently.  Patient has been instructed to discontinue Eliquis. 7.  Neutropenia: Resolved.  Dose reduce tablet as above. 8.  Dental complaints: Patient has been instructed if she needs dental intervention, to proceed as scheduled per her dentist.  Any invasive procedures likely will need  to be timed several days prior to day 1 of her next cycle. 9.  Memory complaints: MRI of the brain on December 21, 2017 confirmed widespread osseous metastatic disease, but no intracranial abnormality was noted. 10.  Mildly improved.  Monitor. 11.  Poor appetite: Patient was given a prescription for Megace today.  Patient expressed understanding and was in agreement with this plan. She also understands that She can call clinic at any time with any questions, concerns, or complaints.   Breast cancer   Staging form: Breast, AJCC 7th Edition     Pathologic stage from 08/11/2014: Stage IIIA (T0, N2a, cM0) - Signed by Lloyd Huger, MD on 08/11/2014   Lloyd Huger, MD   03/13/2018 3:34 PM

## 2018-03-13 ENCOUNTER — Encounter: Payer: Self-pay | Admitting: Oncology

## 2018-03-13 ENCOUNTER — Inpatient Hospital Stay: Payer: Medicare Other

## 2018-03-13 ENCOUNTER — Inpatient Hospital Stay (HOSPITAL_BASED_OUTPATIENT_CLINIC_OR_DEPARTMENT_OTHER): Payer: Medicare Other | Admitting: Oncology

## 2018-03-13 ENCOUNTER — Other Ambulatory Visit: Payer: Self-pay

## 2018-03-13 ENCOUNTER — Inpatient Hospital Stay (HOSPITAL_BASED_OUTPATIENT_CLINIC_OR_DEPARTMENT_OTHER): Payer: Medicare Other | Admitting: Hospice and Palliative Medicine

## 2018-03-13 VITALS — BP 99/68 | HR 98 | Temp 97.0°F | Resp 16 | Ht 63.0 in | Wt 176.5 lb

## 2018-03-13 DIAGNOSIS — C787 Secondary malignant neoplasm of liver and intrahepatic bile duct: Secondary | ICD-10-CM

## 2018-03-13 DIAGNOSIS — Z7189 Other specified counseling: Secondary | ICD-10-CM

## 2018-03-13 DIAGNOSIS — E46 Unspecified protein-calorie malnutrition: Secondary | ICD-10-CM

## 2018-03-13 DIAGNOSIS — C7951 Secondary malignant neoplasm of bone: Secondary | ICD-10-CM

## 2018-03-13 DIAGNOSIS — Z515 Encounter for palliative care: Secondary | ICD-10-CM | POA: Diagnosis not present

## 2018-03-13 DIAGNOSIS — C50912 Malignant neoplasm of unspecified site of left female breast: Secondary | ICD-10-CM | POA: Diagnosis not present

## 2018-03-13 DIAGNOSIS — R630 Anorexia: Secondary | ICD-10-CM

## 2018-03-13 DIAGNOSIS — R531 Weakness: Secondary | ICD-10-CM

## 2018-03-13 DIAGNOSIS — C50919 Malignant neoplasm of unspecified site of unspecified female breast: Secondary | ICD-10-CM | POA: Diagnosis not present

## 2018-03-13 DIAGNOSIS — Z170 Estrogen receptor positive status [ER+]: Secondary | ICD-10-CM | POA: Diagnosis not present

## 2018-03-13 DIAGNOSIS — D649 Anemia, unspecified: Secondary | ICD-10-CM | POA: Diagnosis not present

## 2018-03-13 DIAGNOSIS — R5383 Other fatigue: Secondary | ICD-10-CM

## 2018-03-13 DIAGNOSIS — G620 Drug-induced polyneuropathy: Secondary | ICD-10-CM

## 2018-03-13 DIAGNOSIS — G893 Neoplasm related pain (acute) (chronic): Secondary | ICD-10-CM

## 2018-03-13 DIAGNOSIS — K769 Liver disease, unspecified: Secondary | ICD-10-CM | POA: Diagnosis not present

## 2018-03-13 DIAGNOSIS — M8580 Other specified disorders of bone density and structure, unspecified site: Secondary | ICD-10-CM | POA: Diagnosis not present

## 2018-03-13 DIAGNOSIS — I82C12 Acute embolism and thrombosis of left internal jugular vein: Secondary | ICD-10-CM | POA: Diagnosis not present

## 2018-03-13 DIAGNOSIS — Z66 Do not resuscitate: Secondary | ICD-10-CM

## 2018-03-13 DIAGNOSIS — Z5111 Encounter for antineoplastic chemotherapy: Secondary | ICD-10-CM | POA: Diagnosis not present

## 2018-03-13 LAB — CBC WITH DIFFERENTIAL/PLATELET
Abs Immature Granulocytes: 0.4 10*3/uL — ABNORMAL HIGH (ref 0.00–0.07)
BAND NEUTROPHILS: 8 %
Basophils Absolute: 0 10*3/uL (ref 0.0–0.1)
Basophils Relative: 0 %
Eosinophils Absolute: 0 10*3/uL (ref 0.0–0.5)
Eosinophils Relative: 0 %
HEMATOCRIT: 28.1 % — AB (ref 36.0–46.0)
Hemoglobin: 8.7 g/dL — ABNORMAL LOW (ref 12.0–15.0)
Lymphocytes Relative: 8 %
Lymphs Abs: 0.4 10*3/uL — ABNORMAL LOW (ref 0.7–4.0)
MCH: 31.3 pg (ref 26.0–34.0)
MCHC: 31 g/dL (ref 30.0–36.0)
MCV: 101.1 fL — ABNORMAL HIGH (ref 80.0–100.0)
Metamyelocytes Relative: 8 %
Monocytes Absolute: 1.9 10*3/uL — ABNORMAL HIGH (ref 0.1–1.0)
Monocytes Relative: 37 %
Neutro Abs: 2.4 10*3/uL (ref 1.7–7.7)
Neutrophils Relative %: 39 %
Platelets: 273 10*3/uL (ref 150–400)
RBC: 2.78 MIL/uL — ABNORMAL LOW (ref 3.87–5.11)
RDW: 15.9 % — ABNORMAL HIGH (ref 11.5–15.5)
WBC: 5 10*3/uL (ref 4.0–10.5)
nRBC: 1.8 % — ABNORMAL HIGH (ref 0.0–0.2)

## 2018-03-13 LAB — COMPREHENSIVE METABOLIC PANEL
ALT: 51 U/L — AB (ref 0–44)
AST: 151 U/L — ABNORMAL HIGH (ref 15–41)
Albumin: 2.9 g/dL — ABNORMAL LOW (ref 3.5–5.0)
Alkaline Phosphatase: 253 U/L — ABNORMAL HIGH (ref 38–126)
Anion gap: 8 (ref 5–15)
BUN: 15 mg/dL (ref 8–23)
CHLORIDE: 106 mmol/L (ref 98–111)
CO2: 24 mmol/L (ref 22–32)
Calcium: 9 mg/dL (ref 8.9–10.3)
Creatinine, Ser: 1.26 mg/dL — ABNORMAL HIGH (ref 0.44–1.00)
GFR calc Af Amer: 51 mL/min — ABNORMAL LOW (ref >60)
GFR calc non Af Amer: 44 mL/min — ABNORMAL LOW (ref >60)
Glucose, Bld: 149 mg/dL — ABNORMAL HIGH (ref 70–99)
Potassium: 4 mmol/L (ref 3.5–5.1)
Sodium: 138 mmol/L (ref 135–145)
Total Bilirubin: 0.8 mg/dL (ref 0.3–1.2)
Total Protein: 5.8 g/dL — ABNORMAL LOW (ref 6.5–8.1)

## 2018-03-13 MED ORDER — MEGESTROL ACETATE 20 MG PO TABS: 20.0000 mg | ORAL_TABLET | Freq: Every day | ORAL | 0 refills | Status: DC

## 2018-03-13 MED ORDER — HEPARIN SOD (PORK) LOCK FLUSH 100 UNIT/ML IV SOLN
500.0000 [IU] | Freq: Once | INTRAVENOUS | Status: AC
  Administered 2018-03-13: 500 [IU] via INTRAVENOUS

## 2018-03-13 MED ORDER — HEPARIN SOD (PORK) LOCK FLUSH 100 UNIT/ML IV SOLN
INTRAVENOUS | Status: AC
  Filled 2018-03-13: qty 5

## 2018-03-13 NOTE — Progress Notes (Signed)
[facility]  Telephone:([phone] [phone]   Name: Amanda [name] Date: 03/13/2018 MRN: [id number]  DOB: 1950/12/06  Patient Care Team: Sofie Hartigan, MD as PCP - General (Family Medicine) Dahlia Byes, Marjory Lies, MD as Consulting Physician (General Surgery)    REASON FOR CONSULTATION: Palliative Care consult requested for this 68 y.o. 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  She was found to have disease progression on Ibrance and letrozole.  Patient previously had verbalized a desire not to pursue further treatment in the event of disease progression.  However, she is decided to start [place].  Palliative care was consulted to help address goals and follow for support.  SOCIAL HISTORY:    Patient lives at home with her husband.  She has a son and stepson who are involved.  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  Her husband owns a "ozone" business.  ADVANCE DIRECTIVES:  Not on file. Has HCPOA and living will at home.   CODE STATUS: DNR  PAST MEDICAL HISTORY: Past Medical History:  Diagnosis Date  . Anxiety   . Back pain    occasionally  . Breast cancer ([place]) 2009   left  . Depression    takes Paxil and Wellbutrin daily  . Diabetes ([place])    takes Metformin daily   type 2   . Genetic testing 02/03/2017   Multi-Cancer panel (83 genes) @ [facility] - No pathogenic mutations detected  . GERD (gastroesophageal reflux disease)    occasional  . History of bronchitis 2 yrs ago  . History of kidney stones   . Hyperlipidemia    takes Atorvastatin daily  . Hypertension    no meds  . Insomnia    takes Ambien nightly  . Insomnia    takes gabapentin nightly  . Joint pain   . Mood swings   . Osteoarthritis of knee   . Personal history of chemotherapy 12/05/2016   Mets from Breast Cancer  . Personal history of radiation therapy 11/2016  .  Pneumonia   . PONV (postoperative nausea and vomiting)   . Seasonal allergies    takes Allegra daily    PAST SURGICAL HISTORY:  Past Surgical History:  Procedure Laterality Date  . BREAST BIOPSY  2009  . cataract surgery Bilateral   . COLONOSCOPY    . IR RADIOLOGIST EVAL & MGMT  01/29/2018  . JOINT REPLACEMENT Left 2014   knee  . KNEE ARTHROSCOPY Left    x 5  . MASTECTOMY Left   . port a cath placed    . PORTACATH PLACEMENT N/A 09/17/2015   Procedure: INSERTION PORT-A-CATH;  Surgeon: Jules Husbands, MD;  Location: [facility] ORS;  Service: General;  Laterality: N/A;  . RADIOLOGY WITH ANESTHESIA N/A 02/20/2018   Procedure: CT WITH ANESTHESIA MICROWAVE THERMAL ABLATION-LIVER;  Surgeon: Jacqulynn Cadet, MD;  Location: [facility];  Service: Anesthesiology;  Laterality: N/A;  . TOOTH EXTRACTION    . TOTAL SHOULDER ARTHROPLASTY Right 06/03/2015   Procedure: TOTAL SHOULDER ARTHROPLASTY;  Surgeon: Tania Ade, MD;  Location: [facility];  Service: Orthopedics;  Laterality: Right;  Right total shoulder arthroplasty  . TOTAL SHOULDER REPLACEMENT Right 06/03/2015  . TUBAL LIGATION      HEMATOLOGY/ONCOLOGY HISTORY:    Cancer of left female breast  ([place])   07/27/2014 Initial Diagnosis    Cancer of left female breast ([place])  Metastatic breast cancer ([place])   09/24/2015 Initial Diagnosis    Metastatic breast cancer ([place])    02/27/2018 -  Chemotherapy    The patient had eriBULin mesylate (HALAVEN) 2.75 mg in sodium chloride 0.9 % 100 mL chemo infusion, 1.4 mg/m2 = 2.75 mg, Intravenous,  Once, 1 of 4 cycles Dose modification: 1.1 mg/m2 (original dose 1.4 mg/m2, Cycle 1, Reason: Dose not tolerated) Administration: 2.75 mg (02/27/2018)  for chemotherapy treatment.      ALLERGIES:  is allergic to ace inhibitors; latex; morphine and related; and penicillins.  MEDICATIONS:  Current Outpatient Medications  Medication Sig Dispense Refill  . acetaminophen (TYLENOL) 500 MG tablet Take 500 mg by mouth every 6  (six) hours as needed for mild pain or moderate pain.     Marland Kitchen atorvastatin (LIPITOR) 10 MG tablet Take 10 mg by mouth at bedtime.     Marland Kitchen buPROPion (WELLBUTRIN XL) 150 MG 24 hr tablet Take 150 mg by mouth at bedtime.     . Calcium-Magnesium-Vitamin D (CALCIUM 1200+D3 PO) Take 1 tablet by mouth daily.     . Cyanocobalamin 5000 MCG CAPS Take 5,000 mcg by mouth daily.     . ferrous sulfate 325 (65 FE) MG EC tablet Take 325 mg by mouth daily.     . fexofenadine (ALLEGRA) 180 MG tablet Take 180 mg by mouth at bedtime.     . gabapentin (NEURONTIN) 300 MG capsule Take 300-600 mg by mouth See admin instructions. Take 300 mg by mouth in the morning and 600 mg at night    . lidocaine-prilocaine (EMLA) cream Apply 1 application topically as needed. Apply to port 1-2 hours prior to chemotherapy appointment. Cover with plastic wrap. 30 g 0  . megestrol (MEGACE) 20 MG tablet Take 1 tablet (20 mg total) by mouth daily. 30 tablet 0  . metFORMIN (GLUCOPHAGE) 850 MG tablet Take 850 mg by mouth 2 (two) times daily with a meal.     . Oxycodone HCl 10 MG TABS Take 1 tablet (10 mg total) by mouth every 4 (four) hours as needed. Ok to take every 4-6 hours as needed. 90 tablet 0  . pantoprazole (PROTONIX) 40 MG tablet Take 40 mg by mouth at bedtime.     Marland Kitchen PARoxetine (PAXIL) 40 MG tablet Take 40 mg by mouth at bedtime.     . prochlorperazine (COMPAZINE) 10 MG tablet Take 1 tablet (10 mg total) by mouth every 6 (six) hours as needed for nausea or vomiting. 30 tablet 3  . sodium chloride (MURO 128) 5 % ophthalmic solution Place 1 drop into both eyes 4 (four) times daily.    Marland Kitchen zolpidem (AMBIEN) 10 MG tablet Take 10 mg by mouth at bedtime as needed (sleep).      No current facility-administered medications for this visit.     VITAL SIGNS: There were no vitals taken for this visit. There were no vitals filed for this visit.  Estimated body mass index is 31.27 kg/m as calculated from the following:   Height as of an earlier  encounter on 03/13/18: 5' 3"  (1.6 m).   Weight as of an earlier encounter on 03/13/18: 176 lb 8 oz (80.1 kg).  LABS: CBC:    Component Value Date/Time   WBC 5.0 03/13/2018 0820   HGB 8.7 (L) 03/13/2018 0820   HGB 13.2 12/20/2011 0927   HCT 28.1 (L) 03/13/2018 0820   HCT 40.6 12/20/2011 0927   PLT 273 03/13/2018 0820   PLT 235 12/20/2011 8338  MCV 101.1 (H) 03/13/2018 0820   MCV 96 12/20/2011 0927   NEUTROABS 2.4 03/13/2018 0820   NEUTROABS 1.7 12/20/2011 0927   LYMPHSABS 0.4 (L) 03/13/2018 0820   LYMPHSABS 1.4 12/20/2011 0927   MONOABS 1.9 (H) 03/13/2018 0820   MONOABS 0.4 12/20/2011 0927   EOSABS 0.0 03/13/2018 0820   EOSABS 0.4 12/20/2011 0927   BASOSABS 0.0 03/13/2018 0820   BASOSABS 0.0 12/20/2011 0927   Comprehensive Metabolic Panel:    Component Value Date/Time   NA 138 03/13/2018 0820   NA 139 12/20/2011 0927   K 4.0 03/13/2018 0820   K 4.1 12/20/2011 0927   CL 106 03/13/2018 0820   CL 102 12/20/2011 0927   CO2 24 03/13/2018 0820   CO2 26 12/20/2011 0927   BUN 15 03/13/2018 0820   BUN 19 (H) 12/20/2011 0927   CREATININE 1.26 (H) 03/13/2018 0820   CREATININE 1.15 12/20/2011 0927   GLUCOSE 149 (H) 03/13/2018 0820   GLUCOSE 251 (H) 12/20/2011 0927   CALCIUM 9.0 03/13/2018 0820   CALCIUM 8.6 12/20/2011 0927   AST 151 (H) 03/13/2018 0820   AST 49 (H) 12/20/2011 0927   ALT 51 (H) 03/13/2018 0820   ALT 57 12/20/2011 0927   ALKPHOS 253 (H) 03/13/2018 0820   ALKPHOS 61 12/20/2011 0927   BILITOT 0.8 03/13/2018 0820   BILITOT 0.4 12/20/2011 0927   PROT 5.8 (L) 03/13/2018 0820   PROT 6.8 12/20/2011 0927   ALBUMIN 2.9 (L) 03/13/2018 0820   ALBUMIN 3.6 12/20/2011 0927    RADIOGRAPHIC STUDIES: Dg Chest 2 View  Result Date: 03/03/2018 CLINICAL DATA:  Three-day history of gradually worsening shortness of breath. Current history of metastatic inflammatory breast cancer for which the patient received her most recent chemotherapy on 02/27/2018. EXAM: CHEST - 2 VIEW  COMPARISON:  None. FINDINGS: AP ERECT and LATERAL images were obtained. Cardiac silhouette upper normal in size for AP technique. Thoracic aorta minimally atherosclerotic, unchanged. Hilar and mediastinal contours otherwise unremarkable. RIGHT jugular Port-A-Cath tip at the cavoatrial junction, unchanged. Lungs clear. Bronchovascular markings normal. Pulmonary vascularity normal. No visible pleural effusions. No pneumothorax. Prior RIGHT shoulder hemiarthroplasty with anatomic alignment. Remote healed BILATERAL rib fractures. IMPRESSION: No acute cardiopulmonary disease. Electronically Signed   By: Evangeline Dakin M.D.   On: 03/03/2018 16:54   Ct Angio Chest Pe W Or Wo Contrast  Result Date: 03/03/2018 CLINICAL DATA:  68 year old female with shortness of breath. Metastatic breast cancer, recently started on New chemotherapy. EXAM: CT ANGIOGRAPHY CHEST WITH CONTRAST TECHNIQUE: Multidetector CT imaging of the chest was performed using the standard protocol during bolus administration of intravenous contrast. Multiplanar CT image reconstructions and MIPs were obtained to evaluate the vascular anatomy. CONTRAST:  62m OMNIPAQUE IOHEXOL 350 MG/ML SOLN COMPARISON:  Chest radiographs earlier today. CT Abdomen and Pelvis 02/20/2018. Chest CTA 11/03/2017. FINDINGS: Cardiovascular: Adequate contrast bolus timing in the pulmonary arterial tree. No focal filling defect identified in the pulmonary arteries to suggest acute pulmonary embolism. Calcified coronary artery atherosclerosis. Negative visible aorta aside from mild calcified plaque in the upper abdomen. Stable mild cardiomegaly. No pericardial effusion. Right chest porta cath. Mediastinum/Nodes: Negative, no lymphadenopathy. Lungs/Pleura: Small or trace layering left pleural effusion. Chronic scarring in the anterior left upper lobe, perhaps radiation related. Peribronchial ground-glass opacity elsewhere seen in October has virtually resolved. The trachea and major  airways are patent. No pulmonary consolidation. No new abnormal pulmonary opacity. Upper Abdomen: Stable elevation of the right hemidiaphragm. Heterogeneous enhancement of the liver was  better demonstrated on 02/20/2018. The visible spleen, pancreas, adrenal glands, left kidney remain normal. Small gastric hiatal hernia. Negative visible bowel. Musculoskeletal: Stable left chest wall since October. Widespread skeletal metastases. Superimposed right shoulder arthroplasty. No pathologic fracture identified. Review of the MIP images confirms the above findings. IMPRESSION: 1.  Negative for acute pulmonary embolus. 2. Trace layering left pleural effusion but no other acute findings in the chest. 3. Widespread skeletal and hepatic metastatic disease. Electronically Signed   By: Genevie Ann M.D.   On: 03/03/2018 21:44   Mr Liver W JJ Contrast  Result Date: 02/22/2018 CLINICAL DATA:  Metastatic breast cancer.  Followup liver lesions. EXAM: MRI ABDOMEN WITHOUT AND WITH CONTRAST TECHNIQUE: Multiplanar multisequence MR imaging of the abdomen was performed both before and after the administration of intravenous contrast. CONTRAST:  8 cc Gadavist. COMPARISON:  CT AP 12/18/2017 FINDINGS: Lower chest: Small bilateral pleural effusions appear increased from previous exam. Hepatobiliary: There are innumerable liver lesions identified throughout both lobes of liver compatible with widespread hepatic metastasis. These lesions are too numerous to count and are seen thoughout both lobes. This demonstrates a significant interval progression when compared with 12/18/17. A few sample lesions as follows: -large confluent mass within the dome of liver involving segments 4 and segment 2 measures 11.6 by 4.1 by 2.6 cm. New from previous exam. -the previous index lesion within segment 5 measures 4.0 by 3.9 cm, image 20/9. Previously 2.0 x 1.1 cm. -new lesion along the dome of segment 8 measures 1.8 x 1.8 cm, image 6/9. -new lesion within segment  6 measures 1.9 x 1.9 cm, image 28/9. Mild edema involving the wall of gallbladder.  Nonspecific. Pancreas: No mass, inflammatory changes, or other parenchymal abnormality identified. Spleen:  Within normal limits in size and appearance. Adrenals/Urinary Tract: Normal appearance of the adrenal glands. There is no kidney mass or hydronephrosis identified bilaterally. Mild bilateral renal cortical thinning identified. Stomach/Bowel: Visualized portions within the abdomen are unremarkable. Vascular/Lymphatic: Aortic atherosclerosis. No aneurysm. The portal vein remains patent. No significant adenopathy Other:  Small volume of perihepatic ascites is identified. Musculoskeletal: Multifocal bone metastases are identified. IMPRESSION: 1. Marked interval progression of liver metastases. 2. Increase in volume of pleural effusions. 3. Perihepatic ascites 4. Diffuse bone metastasis Electronically Signed   By: Kerby Moors M.D.   On: 02/22/2018 09:46   Ct Abdomen W Wo Contrast  Result Date: 02/20/2018 CLINICAL DATA:  68 year old female with a history of stage IV metastatic breast cancer with a solitary liver metastasis. She presents today for CT-guided percutaneous thermal ablation. EXAM: CT ABDOMEN WITHOUT AND WITH CONTRAST TECHNIQUE: Multidetector CT imaging of the abdomen was performed following the standard protocol before and following the bolus administration of intravenous contrast. CONTRAST:  151m ISOVUE-370 IOPAMIDOL (ISOVUE-370) INJECTION 76% COMPARISON:  Prior CT scan of the abdomen and pelvis 12/18/2017 FINDINGS: Lower chest: Dependent atelectasis in both lower lobes with small bilateral pleural effusions. Mild cardiomegaly with a small pericardial effusion. The intracardiac blood pool is hypodense relative to the adjacent myocardium consistent with anemia. Hepatobiliary: The liver remains similar in size and configuration although there is now some slight nodularity of the hepatic contour which was not  definitively appreciated on prior imaging. The liver is diffusely heterogeneous on the precontrast images. Following administration of arterial contrast, there is an extremely heterogeneous perfusion pattern of the liver with multifocal areas of hypoenhancement, some of which is confluent in nature. The previously identified lesion in hepatic segment 5 has enlarged and now measures  approximately 2.9 x 1.8 cm. On the portal venous phase imaging, the liver remains extremely heterogeneous in appearance. The known lesion in segment 5 remains similar in size. There are other masslike areas of relatively low attenuation which are more amorphous and difficult to measure discretely. The gallbladder is contracted. No biliary ductal dilatation. Pancreas: Unremarkable. No pancreatic ductal dilatation or surrounding inflammatory changes. Spleen: Normal in size without focal abnormality. Adrenals/Urinary Tract: Normal adrenal glands. No evidence of hydronephrosis or enhancing renal lesion. Parapelvic renal sinus cysts noted on the left. Nonobstructing punctate stone in the right lower pole collecting system, unchanged. Stomach/Bowel: No focal bowel wall thickening or evidence of obstruction. Vascular/Lymphatic: Calcifications present in the abdominal aorta. Other: No abdominal wall hernia or abnormality. Musculoskeletal: Multifocal sclerotic osseous lesions again noted consistent with known osseous metastatic disease. Similar appearance of left mastectomy surgical site with small residual seroma. No significant interval change. IMPRESSION: Unfortunately, there is been interval development of extreme heterogeneity of the hepatic parenchyma over the relatively short interval compared to the prior CT scan dated 12/18/2017. Correlation with intra procedural ultrasound imaging demonstrates multifocal hypoechoic solid masses throughout the liver several of which have a more infiltrative appearance. The overall imaging appearance is  most consistent with significant interval progression of hepatic metastatic disease much of which now has a diffusely infiltrative appearance. The only other non malignant possibility would be sudden interval development of extensive masslike fatty infiltration which would be atypical over such a short interval. If further imaging is clinically warranted, MRI of the abdomen with and without gadolinium contrast could confirm. Electronically Signed   By: Jacqulynn Cadet M.D.   On: 02/20/2018 14:29    PERFORMANCE STATUS (ECOG) : 1 - Symptomatic but completely ambulatory  Review of Systems As noted above. Otherwise, a complete review of systems is negative.  Physical Exam General: NAD, frail appearing Pulmonary: unlabored Extremities: no edema, no joint deformities Skin: no rashes Neurological: Weakness but otherwise nonfocal  IMPRESSION: I met with patient and her husband today in the clinic. Treatment was postponed for at least a week. Patient has reported increased fatigue, weakness, and poor oral intake.   Patient denies pain.  Says PRN use of oxycodone is highly effective.  We discussed fatigue management and energy conservation techniques.  We also explored ideas for calorie consumption.  I recommended more frequent smaller meals throughout the day with a focus on high-protein and high-calorie foods.  I also recommended that she increase her oral supplements from 1 time a day to 3 times a day.  We will make referral to see RD.  Patient's goals are still aligned with treatment if possible.  However, she seems to be accepting that she might be nearing a point where there are no viable treatment options.  Patient says her primary goal is comfort and quality of life.  She says that she has a healthcare power of attorney (her husband) and living will documents at home.  She will bring those to [place] to have on record.  We discussed CODE STATUS.  Patient says that she would not want her life  prolonged artificially nor would she want to be resuscitated.   I completed a MOST form today. The patient and family outlined their wishes for the following treatment decisions:  Cardiopulmonary Resuscitation: Do Not Attempt Resuscitation (DNR/No CPR)  Medical Interventions: Limited Additional Interventions: Use medical treatment, IV fluids and cardiac monitoring as indicated, DO NOT USE intubation or mechanical ventilation. May consider use of less invasive  airway support such as BiPAP or CPAP. Also provide comfort measures. Transfer to the hospital if indicated. Avoid intensive care.   Antibiotics: Antibiotics if indicated  IV Fluids: IV fluids if indicated  Feeding Tube: No feeding tube    PLAN: -Continue current scope of treatment -Continue oxycodone prn for pain -Referral to RD -DNR -MOST form completed as outlined above -RTC in 1 week   Patient expressed understanding and was in agreement with this plan. She also understands that She can call clinic at any time with any questions, concerns, or complaints.    Time Total: 30 minutes  Visit consisted of counseling and education dealing with the complex and emotionally intense issues of symptom management and palliative care in the setting of serious and potentially life-threatening illness.Greater than 50%  of this time was spent counseling and coordinating care related to the above assessment and plan.  Signed by: Altha Harm, PhD, NP-C [phone] (Work Cell)

## 2018-03-13 NOTE — Progress Notes (Signed)
Patient here for pre-treatment check. She reports being extremely fatigued, back pain continues, poor appetite.

## 2018-03-18 NOTE — Progress Notes (Signed)
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  INTERVAL HISTORY: Patient returns to clinic today for further evaluation and reconsideration of Halaven.  She continues to have a decreased performance status and chronic weakness and fatigue, which is only mildly improved from last week.  She continues to have a poor appetite. She has a chronic peripheral neuropathy, but no other neurologic complaints.  She denies any fevers.  She denies any chest pain, cough, or hemoptysis. She denies any nausea, vomiting, constipation, or diarrhea. She has no urinary complaints.  Patient offers no further specific complaints today.  REVIEW OF SYSTEMS:   Review of Systems  Constitutional: Positive for malaise/fatigue. Negative for fever and weight loss.  Eyes: Negative.  Negative for blurred vision, double vision and pain.  Respiratory: Negative.  Negative for cough and shortness of breath.   Cardiovascular: Negative.  Negative for chest pain and leg swelling.  Gastrointestinal: Negative.  Negative for abdominal pain.  Genitourinary: Negative.  Negative for dysuria and flank pain.  Musculoskeletal: Negative for back pain and falls.  Skin: Negative.  Negative for rash.  Neurological: Positive for tingling, sensory change and weakness. Negative for focal weakness and headaches.  Endo/Heme/Allergies: Negative.   Psychiatric/Behavioral: Negative.  Negative for memory loss. The patient is not nervous/anxious.     As per HPI. Otherwise, a complete review of systems is negative.  PAST MEDICAL HISTORY: Past Medical History:  Diagnosis Date  . Anxiety   . Back pain    occasionally  . Breast cancer ([place]) 2009   left  . Depression    takes Paxil and Wellbutrin daily  . Diabetes ([place])    takes Metformin daily   type 2   . Genetic testing 02/03/2017   Multi-Cancer panel (83 genes) @ [facility] - No pathogenic mutations detected  . GERD (gastroesophageal reflux disease)    occasional  . History of bronchitis 2 yrs ago  . History of kidney stones   . Hyperlipidemia    takes Atorvastatin daily  . Hypertension    no meds  . Insomnia    takes Ambien nightly  . Insomnia    takes gabapentin nightly  . Joint pain   . Mood swings   . Osteoarthritis of knee   . Personal history of chemotherapy 12/05/2016   Mets from Breast Cancer  . Personal history of radiation therapy 11/2016  . Pneumonia   . PONV (postoperative nausea and vomiting)   . Seasonal allergies    takes Allegra daily    PAST SURGICAL HISTORY: Past Surgical History:  Procedure Laterality Date  . BREAST BIOPSY  2009  . cataract surgery Bilateral   . COLONOSCOPY    . IR RADIOLOGIST EVAL & MGMT  01/29/2018  . JOINT REPLACEMENT Left 2014   knee  . KNEE ARTHROSCOPY Left    x 5  . MASTECTOMY Left   . port a cath placed    . PORTACATH PLACEMENT N/A 09/17/2015   Procedure: INSERTION PORT-A-CATH;  Surgeon: Jules Husbands, MD;  Location: [facility];  Service: General;  Laterality: N/A;  . RADIOLOGY WITH ANESTHESIA N/A 02/20/2018   Procedure: CT WITH ANESTHESIA MICROWAVE THERMAL ABLATION-LIVER;  Surgeon: Jacqulynn Cadet, MD;  Location:  [facility];  Service: Anesthesiology;  Laterality: N/A;  . TOOTH EXTRACTION    . TOTAL SHOULDER ARTHROPLASTY Right 06/03/2015   Procedure: TOTAL SHOULDER ARTHROPLASTY;  Surgeon: Tania Ade, MD;  Location: [facility];  Service: Orthopedics;  Laterality: Right;  Right total shoulder arthroplasty  . TOTAL SHOULDER REPLACEMENT Right 06/03/2015  . TUBAL LIGATION      FAMILY HISTORY: Father with non-Hodgkin's lymphoma, 2 paternal aunts with breast cancer.     ADVANCED  DIRECTIVES:    HEALTH MAINTENANCE: Social History   Tobacco Use  . Smoking status: Never Smoker  . Smokeless tobacco: Never Used  Substance Use Topics  . Alcohol use: Yes    Alcohol/week: 0.0 standard drinks    Comment: occasionally wine  . Drug use: Yes    Types: Marijuana    Comment: cannabis with no extra  low dose edibles  for neuropathy     Colonoscopy:  PAP:  Bone density:  Lipid panel:  Allergies  Allergen Reactions  . Ace Inhibitors Cough  . Latex Itching  . Morphine And Related Itching    Caused her to itch terribly. Would prefer if given to take with a benadryl  . Penicillins Rash    Has patient had a PCN reaction causing immediate rash, facial/tongue/throat swelling, SOB or lightheadedness with hypotension: No Has patient had a PCN reaction causing severe rash involving mucus membranes or skin necrosis: No Has patient had a PCN reaction that required hospitalization No Has patient had a PCN reaction occurring within the last 10 years: No If all of the above answers are "NO", then may proceed with Cephalosporin use.    Current Outpatient Medications  Medication Sig Dispense Refill  . acetaminophen (TYLENOL) 500 MG tablet Take 500 mg by mouth every 6 (six) hours as needed for mild pain or moderate pain.     Marland Kitchen atorvastatin (LIPITOR) 10 MG tablet Take 10 mg by mouth at bedtime.     Marland Kitchen buPROPion (WELLBUTRIN XL) 150 MG 24 hr tablet Take 150 mg by mouth at bedtime.     . Calcium-Magnesium-Vitamin D (CALCIUM 1200+D3 PO) Take 1 tablet by mouth daily.     . Cyanocobalamin 5000 MCG CAPS Take 5,000 mcg by mouth daily.     . ferrous sulfate 325 (65 FE) MG EC tablet Take 325 mg by mouth daily.     . fexofenadine (ALLEGRA) 180 MG tablet Take 180 mg by mouth at bedtime.     . gabapentin (NEURONTIN) 300 MG capsule Take 300-600 mg by mouth See admin instructions. Take 300 mg by mouth in the morning and 600 mg at night    . lidocaine-prilocaine (EMLA) cream Apply 1 application  topically as needed. Apply to port 1-2 hours prior to chemotherapy appointment. Cover with plastic wrap. 30 g 0  . metFORMIN (GLUCOPHAGE) 850 MG tablet Take 850 mg by mouth 2 (two) times daily with a meal.     . Oxycodone HCl 10 MG TABS Take 1 tablet (10 mg total) by mouth every 4 (four) hours as needed. Ok to take every 4-6 hours as needed. 90 tablet 0  . pantoprazole (PROTONIX) 40 MG tablet Take 40 mg by mouth at bedtime.     Marland Kitchen PARoxetine (PAXIL) 40 MG tablet Take 40 mg by mouth at bedtime.     . prochlorperazine (COMPAZINE) 10 MG tablet Take 1 tablet (10 mg total) by mouth every 6 (six) hours as needed for nausea or vomiting. 30 tablet 3  . sodium chloride (MURO  128) 5 % ophthalmic solution Place 1 drop into both eyes 4 (four) times daily.    Marland Kitchen zolpidem (AMBIEN) 10 MG tablet Take 10 mg by mouth at bedtime as needed (sleep).     Marland Kitchen dexamethasone (DECADRON) 4 MG tablet Take 1 tablet (4 mg total) by mouth daily. 30 tablet 1   No current facility-administered medications for this visit.     OBJECTIVE: Vitals:   03/20/18 0906  BP: 117/68  Pulse: 92  Resp: 18  Temp: (!) 97 F (36.1 C)     Body mass index is 30.82 kg/m.    ECOG FS:2 - Symptomatic, <50% confined to bed  General: Ill-appearing, no acute distress.  Sitting in a wheelchair. Eyes: Pink conjunctiva, anicteric sclera. HEENT: Normocephalic, moist mucous membranes, clear oropharnyx. Lungs: Clear to auscultation bilaterally. Heart: Regular rate and rhythm. No rubs, murmurs, or gallops. Abdomen: Soft, nontender, nondistended. No organomegaly noted, normoactive bowel sounds. Musculoskeletal: No edema, cyanosis, or clubbing. Neuro: Alert, answering all questions appropriately. Cranial nerves grossly intact. Skin: No rashes or petechiae noted. Psych: Normal affect.  LAB RESULTS:  Lab Results  Component Value Date   NA 136 03/20/2018   K 4.7 03/20/2018   CL 105 03/20/2018   CO2 22 03/20/2018   GLUCOSE 171 (H) 03/20/2018   BUN  21 03/20/2018   CREATININE 1.09 (H) 03/20/2018   CALCIUM 8.7 (L) 03/20/2018   PROT 5.9 (L) 03/20/2018   ALBUMIN 3.0 (L) 03/20/2018   AST 166 (H) 03/20/2018   ALT 49 (H) 03/20/2018   ALKPHOS 275 (H) 03/20/2018   BILITOT 0.7 03/20/2018   GFRNONAA 53 (L) 03/20/2018   GFRAA >60 03/20/2018    Lab Results  Component Value Date   WBC 8.2 03/20/2018   NEUTROABS 6.2 03/20/2018   HGB 8.6 (L) 03/20/2018   HCT 28.5 (L) 03/20/2018   MCV 102.2 (H) 03/20/2018   PLT 294 03/20/2018     STUDIES: Dg Chest 2 View  Result Date: 03/03/2018 CLINICAL DATA:  Three-day history of gradually worsening shortness of breath. Current history of metastatic inflammatory breast cancer for which the patient received her most recent chemotherapy on 02/27/2018. EXAM: CHEST - 2 VIEW COMPARISON:  None. FINDINGS: AP ERECT and LATERAL images were obtained. Cardiac silhouette upper normal in size for AP technique. Thoracic aorta minimally atherosclerotic, unchanged. Hilar and mediastinal contours otherwise unremarkable. RIGHT jugular Port-A-Cath tip at the cavoatrial junction, unchanged. Lungs clear. Bronchovascular markings normal. Pulmonary vascularity normal. No visible pleural effusions. No pneumothorax. Prior RIGHT shoulder hemiarthroplasty with anatomic alignment. Remote healed BILATERAL rib fractures. IMPRESSION: No acute cardiopulmonary disease. Electronically Signed   By: Evangeline Dakin M.D.   On: 03/03/2018 16:54   Ct Angio Chest Pe W Or Wo Contrast  Result Date: 03/03/2018 CLINICAL DATA:  68 year old female with shortness of breath. Metastatic breast cancer, recently started on New chemotherapy. EXAM: CT ANGIOGRAPHY CHEST WITH CONTRAST TECHNIQUE: Multidetector CT imaging of the chest was performed using the standard protocol during bolus administration of intravenous contrast. Multiplanar CT image reconstructions and MIPs were obtained to evaluate the vascular anatomy. CONTRAST:  69m OMNIPAQUE IOHEXOL 350 MG/ML  SOLN COMPARISON:  Chest radiographs earlier today. CT Abdomen and Pelvis 02/20/2018. Chest CTA 11/03/2017. FINDINGS: Cardiovascular: Adequate contrast bolus timing in the pulmonary arterial tree. No focal filling defect identified in the pulmonary arteries to suggest acute pulmonary embolism. Calcified coronary artery atherosclerosis. Negative visible aorta aside from mild calcified plaque in the upper abdomen. Stable mild cardiomegaly. No pericardial effusion. Right chest  porta cath. Mediastinum/Nodes: Negative, no lymphadenopathy. Lungs/Pleura: Small or trace layering left pleural effusion. Chronic scarring in the anterior left upper lobe, perhaps radiation related. Peribronchial ground-glass opacity elsewhere seen in October has virtually resolved. The trachea and major airways are patent. No pulmonary consolidation. No new abnormal pulmonary opacity. Upper Abdomen: Stable elevation of the right hemidiaphragm. Heterogeneous enhancement of the liver was better demonstrated on 02/20/2018. The visible spleen, pancreas, adrenal glands, left kidney remain normal. Small gastric hiatal hernia. Negative visible bowel. Musculoskeletal: Stable left chest wall since October. Widespread skeletal metastases. Superimposed right shoulder arthroplasty. No pathologic fracture identified. Review of the MIP images confirms the above findings. IMPRESSION: 1.  Negative for acute pulmonary embolus. 2. Trace layering left pleural effusion but no other acute findings in the chest. 3. Widespread skeletal and hepatic metastatic disease. Electronically Signed   By: Genevie Ann M.D.   On: 03/03/2018 21:44   Mr Liver W AO Contrast  Result Date: 02/22/2018 CLINICAL DATA:  Metastatic breast cancer.  Followup liver lesions. EXAM: MRI ABDOMEN WITHOUT AND WITH CONTRAST TECHNIQUE: Multiplanar multisequence MR imaging of the abdomen was performed both before and after the administration of intravenous contrast. CONTRAST:  8 cc Gadavist. COMPARISON:   CT AP 12/18/2017 FINDINGS: Lower chest: Small bilateral pleural effusions appear increased from previous exam. Hepatobiliary: There are innumerable liver lesions identified throughout both lobes of liver compatible with widespread hepatic metastasis. These lesions are too numerous to count and are seen thoughout both lobes. This demonstrates a significant interval progression when compared with 12/18/17. A few sample lesions as follows: -large confluent mass within the dome of liver involving segments 4 and segment 2 measures 11.6 by 4.1 by 2.6 cm. New from previous exam. -the previous index lesion within segment 5 measures 4.0 by 3.9 cm, image 20/9. Previously 2.0 x 1.1 cm. -new lesion along the dome of segment 8 measures 1.8 x 1.8 cm, image 6/9. -new lesion within segment 6 measures 1.9 x 1.9 cm, image 28/9. Mild edema involving the wall of gallbladder.  Nonspecific. Pancreas: No mass, inflammatory changes, or other parenchymal abnormality identified. Spleen:  Within normal limits in size and appearance. Adrenals/Urinary Tract: Normal appearance of the adrenal glands. There is no kidney mass or hydronephrosis identified bilaterally. Mild bilateral renal cortical thinning identified. Stomach/Bowel: Visualized portions within the abdomen are unremarkable. Vascular/Lymphatic: Aortic atherosclerosis. No aneurysm. The portal vein remains patent. No significant adenopathy Other:  Small volume of perihepatic ascites is identified. Musculoskeletal: Multifocal bone metastases are identified. IMPRESSION: 1. Marked interval progression of liver metastases. 2. Increase in volume of pleural effusions. 3. Perihepatic ascites 4. Diffuse bone metastasis Electronically Signed   By: Kerby Moors M.D.   On: 02/22/2018 09:46   Ct Abdomen W Wo Contrast  Result Date: 02/20/2018 CLINICAL DATA:  68 year old female with a history of stage IV metastatic breast cancer with a solitary liver metastasis. She presents today for CT-guided  percutaneous thermal ablation. EXAM: CT ABDOMEN WITHOUT AND WITH CONTRAST TECHNIQUE: Multidetector CT imaging of the abdomen was performed following the standard protocol before and following the bolus administration of intravenous contrast. CONTRAST:  145m ISOVUE-370 IOPAMIDOL (ISOVUE-370) INJECTION 76% COMPARISON:  Prior CT scan of the abdomen and pelvis 12/18/2017 FINDINGS: Lower chest: Dependent atelectasis in both lower lobes with small bilateral pleural effusions. Mild cardiomegaly with a small pericardial effusion. The intracardiac blood pool is hypodense relative to the adjacent myocardium consistent with anemia. Hepatobiliary: The liver remains similar in size and configuration although there is now  some slight nodularity of the hepatic contour which was not definitively appreciated on prior imaging. The liver is diffusely heterogeneous on the precontrast images. Following administration of arterial contrast, there is an extremely heterogeneous perfusion pattern of the liver with multifocal areas of hypoenhancement, some of which is confluent in nature. The previously identified lesion in hepatic segment 5 has enlarged and now measures approximately 2.9 x 1.8 cm. On the portal venous phase imaging, the liver remains extremely heterogeneous in appearance. The known lesion in segment 5 remains similar in size. There are other masslike areas of relatively low attenuation which are more amorphous and difficult to measure discretely. The gallbladder is contracted. No biliary ductal dilatation. Pancreas: Unremarkable. No pancreatic ductal dilatation or surrounding inflammatory changes. Spleen: Normal in size without focal abnormality. Adrenals/Urinary Tract: Normal adrenal glands. No evidence of hydronephrosis or enhancing renal lesion. Parapelvic renal sinus cysts noted on the left. Nonobstructing punctate stone in the right lower pole collecting system, unchanged. Stomach/Bowel: No focal bowel wall thickening  or evidence of obstruction. Vascular/Lymphatic: Calcifications present in the abdominal aorta. Other: No abdominal wall hernia or abnormality. Musculoskeletal: Multifocal sclerotic osseous lesions again noted consistent with known osseous metastatic disease. Similar appearance of left mastectomy surgical site with small residual seroma. No significant interval change. IMPRESSION: Unfortunately, there is been interval development of extreme heterogeneity of the hepatic parenchyma over the relatively short interval compared to the prior CT scan dated 12/18/2017. Correlation with intra procedural ultrasound imaging demonstrates multifocal hypoechoic solid masses throughout the liver several of which have a more infiltrative appearance. The overall imaging appearance is most consistent with significant interval progression of hepatic metastatic disease much of which now has a diffusely infiltrative appearance. The only other non malignant possibility would be sudden interval development of extensive masslike fatty infiltration which would be atypical over such a short interval. If further imaging is clinically warranted, MRI of the abdomen with and without gadolinium contrast could confirm. Electronically Signed   By: Jacqulynn Cadet M.D.   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  PLAN:    1.  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  Patient wishes to continue with palliative treatment.  She has discontinued Ibrance and letrozole.  Despite decreased performance status, will proceed with her next infusion of dose reduced Halaven today.  Patient will continue to receive Zometa on day 1 of each treatment.  Return to clinic in 1 week for further evaluation  and consideration of continuation of treatment.   2.  Osteopenia: Patient's bone mineral density on January 26, 2016 reported a T score of -0.9 which is considered normal. Continue calcium and vitamin D.  Zometa as above.   3.  Anemia: Patient's hemoglobin remains decreased, but essentially unchanged at 8.6. 4. Peripheral neuropathy: Chronic and unchanged.  Continue gabapentin as prescribed. Neuropathy managed by neurology.  5. Back pain: MRI results from May 04, 2017 did not reveal metastatic disease.  Continue symptomatic treatment.  6.  Left IJ clot: Ultrasound results reviewed independently.  Patient has been instructed to discontinue Eliquis. 7.  Neutropenia: Resolved. 8.  Dental complaints: Patient has been instructed if she needs dental intervention, to proceed as scheduled per her dentist.  Any invasive procedures likely will need to be timed several days prior to day  1 of her next cycle. 9.  Memory complaints: MRI of the brain on December 21, 2017 confirmed widespread osseous metastatic disease, but no intracranial abnormality was noted. 10.  Elevated liver enzymes: Chronic and unchanged. Monitor. 11.  Poor appetite: Megace is not approved by insurance, therefore patient was given a prescription for dexamethasone 4 mg daily.  Patient expressed understanding and was in agreement with this plan. She also understands that She can call clinic at any time with any questions, concerns, or complaints.   Breast cancer   Staging form: Breast, AJCC 7th Edition     Pathologic stage from 08/11/2014: Stage IIIA (T0, N2a, cM0) - Signed by Lloyd Huger, MD on 08/11/2014   Lloyd Huger, MD   03/21/2018 10:55 AM

## 2018-03-19 ENCOUNTER — Other Ambulatory Visit: Payer: Self-pay

## 2018-03-19 NOTE — Discharge Summary (Signed)
[facility] NAME: Amanda [name]    MR#:  [id number]  DATE OF BIRTH:  03-Mar-1950  DATE OF ADMISSION:  03/03/2018 ADMITTING PHYSICIAN: Vaughan Basta, MD  DATE OF DISCHARGE: 03/04/2018  7:20 PM  PRIMARY CARE PHYSICIAN: Sofie Hartigan, MD   ADMISSION DIAGNOSIS:  Weakness [R53.1] Elevated lactic acid level [R79.89]  DISCHARGE DIAGNOSIS:  Principal Problem:   SOB (shortness of breath) Active Problems:   Dizziness   SECONDARY DIAGNOSIS:   Past Medical History:  Diagnosis Date  . Anxiety   . Back pain    occasionally  . Breast cancer ([place]) 2009   left  . Depression    takes Paxil and Wellbutrin daily  . Diabetes ([place])    takes Metformin daily   type 2   . Genetic testing 02/03/2017   Multi-Cancer panel (83 genes) @ [facility] - No pathogenic mutations detected  . GERD (gastroesophageal reflux disease)    occasional  . History of bronchitis 2 yrs ago  . History of kidney stones   . Hyperlipidemia    takes Atorvastatin daily  . Hypertension    no meds  . Insomnia    takes Ambien nightly  . Insomnia    takes gabapentin nightly  . Joint pain   . Mood swings   . Osteoarthritis of knee   . Personal history of chemotherapy 12/05/2016   Mets from Breast Cancer  . Personal history of radiation therapy 11/2016  . Pneumonia   . PONV (postoperative nausea and vomiting)   . Seasonal allergies    takes Allegra daily     ADMITTING HISTORY  HISTORY OF PRESENT ILLNESS: Amanda [name]  is a 68 y.o. female with a known history of anxiety, breast cancer with metastasis, depression, diabetes, hyperlipidemia-recently found to have worsening of her metastatic disease and started on new chemotherapy treatment last Wednesday.  Since Thursday she is not feeling too good and yesterday she is getting worse to the point that she is feeling short of breath on minimal exertion walking across the room and also feeling dizzy with this  exertion with some palpitations.  Concerned with this she came to emergency room.  Her lactic acid was slightly elevated but her vitals were stable.  On initial work-up in ER she did not found to have any clear source of infections.  ER physician suggested to observe under medical services and give some IV fluid with admitting diagnosis of dehydration.  HOSPITAL COURSE:   *Dizziness Due to chemotherapy and significant fatigue with dehydration.  Started on IV fluids and improved. She also complained of exertional shortness of breath with her fatigue.  Chest x-ray did not show anything acute.  CT of the chest was done which showed no pulmonary embolism or infiltrate.  Echocardiogram checked and ejection fraction 50%.  Compared to prior echocardiogram it was 55%.  No significant change.  *Metastatic breast cancer.  Follow-up with Dr. Grayland Ormond as outpatient.  *Diabetes mellitus stable  Patient stable for discharge home to follow-up with her oncologist.  CONSULTS OBTAINED:    DRUG ALLERGIES:   Allergies  Allergen Reactions  . Ace Inhibitors Cough  . Latex Itching  . Morphine And Related Itching    Caused her to itch terribly. Would prefer if given to take with a benadryl  . Penicillins Rash    Has patient had a PCN reaction causing immediate rash, facial/tongue/throat swelling, SOB or lightheadedness with hypotension: No Has patient had a PCN  reaction causing severe rash involving mucus membranes or skin necrosis: No Has patient had a PCN reaction that required hospitalization No Has patient had a PCN reaction occurring within the last 10 years: No If all of the above answers are "NO", then may proceed with Cephalosporin use.    DISCHARGE MEDICATIONS:   Allergies as of 03/04/2018      Reactions   Ace Inhibitors Cough   Latex Itching   Morphine And Related Itching   Caused her to itch terribly. Would prefer if given to take with a benadryl   Penicillins Rash   Has patient had a PCN  reaction causing immediate rash, facial/tongue/throat swelling, SOB or lightheadedness with hypotension: No Has patient had a PCN reaction causing severe rash involving mucus membranes or skin necrosis: No Has patient had a PCN reaction that required hospitalization No Has patient had a PCN reaction occurring within the last 10 years: No If all of the above answers are "NO", then may proceed with Cephalosporin use.      Medication List    TAKE these medications   acetaminophen 500 MG tablet Commonly known as:  TYLENOL Take 500 mg by mouth every 6 (six) hours as needed for mild pain or moderate pain.   AMBIEN 10 MG tablet Generic drug:  zolpidem Take 10 mg by mouth at bedtime as needed (sleep).   atorvastatin 10 MG tablet Commonly known as:  LIPITOR Take 10 mg by mouth at bedtime.   buPROPion 150 MG 24 hr tablet Commonly known as:  WELLBUTRIN XL Take 150 mg by mouth at bedtime.   CALCIUM 1200+D3 PO Take 1 tablet by mouth daily.   Cyanocobalamin 5000 MCG Caps Take 5,000 mcg by mouth daily.   ferrous sulfate 325 (65 FE) MG EC tablet Take 325 mg by mouth daily.   fexofenadine 180 MG tablet Commonly known as:  ALLEGRA Take 180 mg by mouth at bedtime.   gabapentin 300 MG capsule Commonly known as:  NEURONTIN Take 300-600 mg by mouth See admin instructions. Take 300 mg by mouth in the morning and 600 mg at night   lidocaine-prilocaine cream Commonly known as:  EMLA Apply 1 application topically as needed. Apply to port 1-2 hours prior to chemotherapy appointment. Cover with plastic wrap.   metFORMIN 850 MG tablet Commonly known as:  GLUCOPHAGE Take 850 mg by mouth 2 (two) times daily with a meal.   Oxycodone HCl 10 MG Tabs Take 1 tablet (10 mg total) by mouth every 4 (four) hours as needed. Ok to take every 4-6 hours as needed.   pantoprazole 40 MG tablet Commonly known as:  PROTONIX Take 40 mg by mouth at bedtime.   PARoxetine 40 MG tablet Commonly known as:   PAXIL Take 40 mg by mouth at bedtime.   prochlorperazine 10 MG tablet Commonly known as:  COMPAZINE Take 1 tablet (10 mg total) by mouth every 6 (six) hours as needed for nausea or vomiting.   sodium chloride 5 % ophthalmic solution Commonly known as:  MURO 128 Place 1 drop into both eyes 4 (four) times daily.       Today   VITAL SIGNS:  Blood pressure 123/63, pulse 87, temperature 98 F (36.7 C), temperature source Oral, resp. rate 18, height 5\' 3"  (1.6 m), weight 84.4 kg, SpO2 92 %.  I/O:  No intake or output data in the 24 hours ending 03/19/18 1452  PHYSICAL EXAMINATION:  Physical Exam  GENERAL:  68 y.o.-year-old patient lying in  the bed with no acute distress.  LUNGS: Normal breath sounds bilaterally, no wheezing, rales,rhonchi or crepitation. No use of accessory muscles of respiration.  CARDIOVASCULAR: S1, S2 normal. No murmurs, rubs, or gallops.  ABDOMEN: Soft, non-tender, non-distended. Bowel sounds present. No organomegaly or mass.  NEUROLOGIC: Moves all 4 extremities. PSYCHIATRIC: The patient is alert and oriented x 3.  SKIN: No obvious rash, lesion, or ulcer.   DATA REVIEW:   CBC Recent Labs  Lab 03/13/18 0820  WBC 5.0  HGB 8.7*  HCT 28.1*  PLT 273    Chemistries  Recent Labs  Lab 03/13/18 0820  NA 138  K 4.0  CL 106  CO2 24  GLUCOSE 149*  BUN 15  CREATININE 1.26*  CALCIUM 9.0  AST 151*  ALT 51*  ALKPHOS 253*  BILITOT 0.8    Cardiac Enzymes No results for input(s): TROPONINI in the last 168 hours.  Microbiology Results  Results for orders placed or performed during the hospital encounter of 03/03/18  Blood culture (routine x 2)     Status: None   Collection Time: 03/03/18  6:35 PM  Result Value Ref Range Status   Specimen Description BLOOD RT [facility]  Final   Special Requests   Final    BOTTLES DRAWN AEROBIC AND ANAEROBIC Blood Culture adequate volume   Culture   Final    NO GROWTH 5 DAYS Performed at [facility], [address], [place], [place] [address]    Report Status 03/08/2018 FINAL  Final  Blood culture (routine x 2)     Status: None   Collection Time: 03/03/18  6:43 PM  Result Value Ref Range Status   Specimen Description BLOOD RT PORT  Final   Special Requests   Final    BOTTLES DRAWN AEROBIC AND ANAEROBIC Blood Culture adequate volume   Culture   Final    NO GROWTH 5 DAYS Performed at [facility], [address] [place], [place] [address]    Report Status 03/08/2018 FINAL  Final    RADIOLOGY:  No results found.  Follow up with PCP in 1 week.  Management plans discussed with the patient, family and they are in agreement.  CODE STATUS:  Code Status History    Date Active Date Inactive Code Status Order ID Comments User Context   03/04/2018 0258 03/04/2018 2225 Full Code [id number]  Vaughan Basta, MD ED   11/03/2017 2110 11/06/2017 1826 Full Code [id number]  Saundra Shelling, MD Inpatient    Advance Directive Documentation     Most Recent Value  Type of Advance Directive  Healthcare Power of [place], Living will  Pre-existing out of facility DNR order (yellow form or pink MOST form)  -  "MOST" Form in Place?  -      TOTAL TIME TAKING CARE OF THIS PATIENT ON DAY OF DISCHARGE: more than 30 minutes.   Leia Alf Aneesh Faller M.D on 03/19/2018 at 2:52 PM  Between 7am to 6pm - Pager - [phone]  After 6pm go to [email or web address] - password [email or web address] [facility]  Office  [phone]  CC: Primary care physician; Sofie Hartigan, MD  Note: This dictation was prepared with Dragon dictation along with smaller phrase technology. Any transcriptional errors that result from this process are unintentional.

## 2018-03-20 ENCOUNTER — Inpatient Hospital Stay: Payer: Medicare Other | Attending: Oncology

## 2018-03-20 ENCOUNTER — Other Ambulatory Visit: Payer: Self-pay

## 2018-03-20 ENCOUNTER — Inpatient Hospital Stay (HOSPITAL_BASED_OUTPATIENT_CLINIC_OR_DEPARTMENT_OTHER): Payer: Medicare Other | Admitting: Oncology

## 2018-03-20 ENCOUNTER — Inpatient Hospital Stay: Payer: Medicare Other

## 2018-03-20 ENCOUNTER — Inpatient Hospital Stay (HOSPITAL_BASED_OUTPATIENT_CLINIC_OR_DEPARTMENT_OTHER): Payer: Medicare Other | Admitting: Hospice and Palliative Medicine

## 2018-03-20 ENCOUNTER — Encounter: Payer: Self-pay | Admitting: Oncology

## 2018-03-20 VITALS — BP 117/68 | HR 92 | Temp 97.0°F | Resp 18 | Wt 174.0 lb

## 2018-03-20 DIAGNOSIS — R5382 Chronic fatigue, unspecified: Secondary | ICD-10-CM | POA: Diagnosis not present

## 2018-03-20 DIAGNOSIS — R748 Abnormal levels of other serum enzymes: Secondary | ICD-10-CM | POA: Diagnosis not present

## 2018-03-20 DIAGNOSIS — D701 Agranulocytosis secondary to cancer chemotherapy: Secondary | ICD-10-CM | POA: Insufficient documentation

## 2018-03-20 DIAGNOSIS — Z79899 Other long term (current) drug therapy: Secondary | ICD-10-CM | POA: Insufficient documentation

## 2018-03-20 DIAGNOSIS — R630 Anorexia: Secondary | ICD-10-CM | POA: Diagnosis not present

## 2018-03-20 DIAGNOSIS — R531 Weakness: Secondary | ICD-10-CM

## 2018-03-20 DIAGNOSIS — C7951 Secondary malignant neoplasm of bone: Secondary | ICD-10-CM | POA: Diagnosis not present

## 2018-03-20 DIAGNOSIS — R42 Dizziness and giddiness: Secondary | ICD-10-CM | POA: Insufficient documentation

## 2018-03-20 DIAGNOSIS — Z5111 Encounter for antineoplastic chemotherapy: Secondary | ICD-10-CM | POA: Insufficient documentation

## 2018-03-20 DIAGNOSIS — C50919 Malignant neoplasm of unspecified site of unspecified female breast: Secondary | ICD-10-CM | POA: Diagnosis not present

## 2018-03-20 DIAGNOSIS — I82C12 Acute embolism and thrombosis of left internal jugular vein: Secondary | ICD-10-CM | POA: Diagnosis not present

## 2018-03-20 DIAGNOSIS — D649 Anemia, unspecified: Secondary | ICD-10-CM | POA: Diagnosis not present

## 2018-03-20 DIAGNOSIS — R350 Frequency of micturition: Secondary | ICD-10-CM | POA: Insufficient documentation

## 2018-03-20 DIAGNOSIS — Z515 Encounter for palliative care: Secondary | ICD-10-CM | POA: Diagnosis not present

## 2018-03-20 DIAGNOSIS — R52 Pain, unspecified: Secondary | ICD-10-CM

## 2018-03-20 DIAGNOSIS — C787 Secondary malignant neoplasm of liver and intrahepatic bile duct: Secondary | ICD-10-CM | POA: Insufficient documentation

## 2018-03-20 DIAGNOSIS — G620 Drug-induced polyneuropathy: Secondary | ICD-10-CM

## 2018-03-20 DIAGNOSIS — R7989 Other specified abnormal findings of blood chemistry: Secondary | ICD-10-CM | POA: Diagnosis not present

## 2018-03-20 DIAGNOSIS — M8580 Other specified disorders of bone density and structure, unspecified site: Secondary | ICD-10-CM

## 2018-03-20 DIAGNOSIS — I959 Hypotension, unspecified: Secondary | ICD-10-CM | POA: Diagnosis not present

## 2018-03-20 LAB — CBC WITH DIFFERENTIAL/PLATELET
ABS IMMATURE GRANULOCYTES: 0.13 10*3/uL — AB (ref 0.00–0.07)
Basophils Absolute: 0 10*3/uL (ref 0.0–0.1)
Basophils Relative: 1 %
Eosinophils Absolute: 0 10*3/uL (ref 0.0–0.5)
Eosinophils Relative: 0 %
HCT: 28.5 % — ABNORMAL LOW (ref 36.0–46.0)
Hemoglobin: 8.6 g/dL — ABNORMAL LOW (ref 12.0–15.0)
Immature Granulocytes: 2 %
Lymphocytes Relative: 10 %
Lymphs Abs: 0.8 10*3/uL (ref 0.7–4.0)
MCH: 30.8 pg (ref 26.0–34.0)
MCHC: 30.2 g/dL (ref 30.0–36.0)
MCV: 102.2 fL — ABNORMAL HIGH (ref 80.0–100.0)
MONO ABS: 1 10*3/uL (ref 0.1–1.0)
Monocytes Relative: 12 %
NEUTROS ABS: 6.2 10*3/uL (ref 1.7–7.7)
Neutrophils Relative %: 75 %
Platelets: 294 10*3/uL (ref 150–400)
RBC: 2.79 MIL/uL — ABNORMAL LOW (ref 3.87–5.11)
RDW: 16.6 % — ABNORMAL HIGH (ref 11.5–15.5)
WBC: 8.2 10*3/uL (ref 4.0–10.5)
nRBC: 0.4 % — ABNORMAL HIGH (ref 0.0–0.2)

## 2018-03-20 LAB — COMPREHENSIVE METABOLIC PANEL
ALBUMIN: 3 g/dL — AB (ref 3.5–5.0)
ALT: 49 U/L — ABNORMAL HIGH (ref 0–44)
AST: 166 U/L — ABNORMAL HIGH (ref 15–41)
Alkaline Phosphatase: 275 U/L — ABNORMAL HIGH (ref 38–126)
Anion gap: 9 (ref 5–15)
BUN: 21 mg/dL (ref 8–23)
CO2: 22 mmol/L (ref 22–32)
Calcium: 8.7 mg/dL — ABNORMAL LOW (ref 8.9–10.3)
Chloride: 105 mmol/L (ref 98–111)
Creatinine, Ser: 1.09 mg/dL — ABNORMAL HIGH (ref 0.44–1.00)
GFR calc Af Amer: >60 mL/min (ref >60)
GFR calc non Af Amer: 53 mL/min — ABNORMAL LOW (ref >60)
Glucose, Bld: 171 mg/dL — ABNORMAL HIGH (ref 70–99)
Potassium: 4.7 mmol/L (ref 3.5–5.1)
Sodium: 136 mmol/L (ref 135–145)
Total Bilirubin: 0.7 mg/dL (ref 0.3–1.2)
Total Protein: 5.9 g/dL — ABNORMAL LOW (ref 6.5–8.1)

## 2018-03-20 LAB — SAMPLE TO BLOOD BANK

## 2018-03-20 MED ORDER — SODIUM CHLORIDE 0.9 % IV SOLN
Freq: Once | INTRAVENOUS | Status: AC
  Administered 2018-03-20: 10:00:00 via INTRAVENOUS
  Filled 2018-03-20: qty 250

## 2018-03-20 MED ORDER — HEPARIN SOD (PORK) LOCK FLUSH 100 UNIT/ML IV SOLN
500.0000 [IU] | Freq: Once | INTRAVENOUS | Status: AC
  Administered 2018-03-20: 500 [IU] via INTRAVENOUS
  Filled 2018-03-20: qty 5

## 2018-03-20 MED ORDER — SODIUM CHLORIDE 0.9% FLUSH
10.0000 mL | INTRAVENOUS | Status: DC | PRN
  Administered 2018-03-20: 10 mL via INTRAVENOUS
  Filled 2018-03-20: qty 10

## 2018-03-20 MED ORDER — SODIUM CHLORIDE 0.9 % IV SOLN
1.1000 mg/m2 | Freq: Once | INTRAVENOUS | Status: AC
  Administered 2018-03-20: 2.15 mg via INTRAVENOUS
  Filled 2018-03-20: qty 4.3

## 2018-03-20 MED ORDER — PROCHLORPERAZINE MALEATE 10 MG PO TABS
10.0000 mg | ORAL_TABLET | Freq: Once | ORAL | Status: AC
  Administered 2018-03-20: 10 mg via ORAL
  Filled 2018-03-20: qty 1

## 2018-03-20 MED ORDER — DEXAMETHASONE 4 MG PO TABS: 4.0000 mg | ORAL_TABLET | Freq: Every day | ORAL | 1 refills | Status: DC

## 2018-03-20 NOTE — Progress Notes (Signed)
[place]  Telephone:([phone] [phone]   Name: Amanda [name] Date: 03/20/2018 MRN: [id number]  DOB: [birth year]  Patient Care Team: Sofie Hartigan, MD as PCP - General (Family Medicine) Dahlia Byes, Marjory Lies, MD as Consulting Physician (General Surgery)    REASON FOR CONSULTATION: Palliative Care consult requested for this 68 y.o. 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  She was found to have disease progression on Ibrance and letrozole.  Patient previously had verbalized a desire not to pursue further treatment in the event of disease progression.  However, she is decided to start [place].  Palliative care was consulted to help address goals and follow for support.  SOCIAL HISTORY:    Patient lives at home with her husband.  She has a son and stepson who are involved.  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  Her husband owns a "ozone" business.  ADVANCE DIRECTIVES:  Not on file. Has HCPOA and living will at home.   CODE STATUS: DNR  PAST MEDICAL HISTORY: Past Medical History:  Diagnosis Date  . Anxiety   . Back pain    occasionally  . Breast cancer ([place]) 2009   left  . Depression    takes Paxil and Wellbutrin daily  . Diabetes ([place])    takes Metformin daily   type 2   . Genetic testing 02/03/2017   Multi-Cancer panel (83 genes) @ [facility] - No pathogenic mutations detected  . GERD (gastroesophageal reflux disease)    occasional  . History of bronchitis 2 yrs ago  . History of kidney stones   . Hyperlipidemia    takes Atorvastatin daily  . Hypertension    no meds  . Insomnia    takes Ambien nightly  . Insomnia    takes gabapentin nightly  . Joint pain   . Mood swings   . Osteoarthritis of knee   . Personal history of chemotherapy 12/05/2016   Mets from Breast Cancer  . Personal history of radiation therapy 11/2016  .  Pneumonia   . PONV (postoperative nausea and vomiting)   . Seasonal allergies    takes Allegra daily    PAST SURGICAL HISTORY:  Past Surgical History:  Procedure Laterality Date  . BREAST BIOPSY  2009  . cataract surgery Bilateral   . COLONOSCOPY    . IR RADIOLOGIST EVAL & MGMT  01/29/2018  . JOINT REPLACEMENT Left 2014   knee  . KNEE ARTHROSCOPY Left    x 5  . MASTECTOMY Left   . port a cath placed    . PORTACATH PLACEMENT N/A 09/17/2015   Procedure: INSERTION PORT-A-CATH;  Surgeon: Jules Husbands, MD;  Location: [facility];  Service: General;  Laterality: N/A;  . RADIOLOGY WITH ANESTHESIA N/A 02/20/2018   Procedure: CT WITH ANESTHESIA MICROWAVE THERMAL ABLATION-LIVER;  Surgeon: Jacqulynn Cadet, MD;  Location: [facility];  Service: Anesthesiology;  Laterality: N/A;  . TOOTH EXTRACTION    . TOTAL SHOULDER ARTHROPLASTY Right 06/03/2015   Procedure: TOTAL SHOULDER ARTHROPLASTY;  Surgeon: Tania Ade, MD;  Location: [facility];  Service: Orthopedics;  Laterality: Right;  Right total shoulder arthroplasty  . TOTAL SHOULDER REPLACEMENT Right 06/03/2015  . TUBAL LIGATION      HEMATOLOGY/ONCOLOGY HISTORY:    Cancer of left female breast  ([place])   07/27/2014 Initial Diagnosis    Cancer of left female breast ([place])  Metastatic breast cancer ([place])   09/24/2015 Initial Diagnosis    Metastatic breast cancer ([place])    02/27/2018 -  Chemotherapy    The patient had eriBULin mesylate (HALAVEN) 2.75 mg in sodium chloride 0.9 % 100 mL chemo infusion, 1.4 mg/m2 = 2.75 mg, Intravenous,  Once, 1 of 4 cycles Dose modification: 1.1 mg/m2 (original dose 1.4 mg/m2, Cycle 1, Reason: Dose not tolerated) Administration: 2.75 mg (02/27/2018)  for chemotherapy treatment.      ALLERGIES:  is allergic to ace inhibitors; latex; morphine and related; and penicillins.  MEDICATIONS:  Current Outpatient Medications  Medication Sig Dispense Refill  . acetaminophen (TYLENOL) 500 MG tablet Take 500 mg by mouth every 6  (six) hours as needed for mild pain or moderate pain.     Marland Kitchen atorvastatin (LIPITOR) 10 MG tablet Take 10 mg by mouth at bedtime.     Marland Kitchen buPROPion (WELLBUTRIN XL) 150 MG 24 hr tablet Take 150 mg by mouth at bedtime.     . Calcium-Magnesium-Vitamin D (CALCIUM 1200+D3 PO) Take 1 tablet by mouth daily.     . Cyanocobalamin 5000 MCG CAPS Take 5,000 mcg by mouth daily.     . ferrous sulfate 325 (65 FE) MG EC tablet Take 325 mg by mouth daily.     . fexofenadine (ALLEGRA) 180 MG tablet Take 180 mg by mouth at bedtime.     . gabapentin (NEURONTIN) 300 MG capsule Take 300-600 mg by mouth See admin instructions. Take 300 mg by mouth in the morning and 600 mg at night    . lidocaine-prilocaine (EMLA) cream Apply 1 application topically as needed. Apply to port 1-2 hours prior to chemotherapy appointment. Cover with plastic wrap. 30 g 0  . metFORMIN (GLUCOPHAGE) 850 MG tablet Take 850 mg by mouth 2 (two) times daily with a meal.     . Oxycodone HCl 10 MG TABS Take 1 tablet (10 mg total) by mouth every 4 (four) hours as needed. Ok to take every 4-6 hours as needed. 90 tablet 0  . pantoprazole (PROTONIX) 40 MG tablet Take 40 mg by mouth at bedtime.     Marland Kitchen PARoxetine (PAXIL) 40 MG tablet Take 40 mg by mouth at bedtime.     . prochlorperazine (COMPAZINE) 10 MG tablet Take 1 tablet (10 mg total) by mouth every 6 (six) hours as needed for nausea or vomiting. 30 tablet 3  . sodium chloride (MURO 128) 5 % ophthalmic solution Place 1 drop into both eyes 4 (four) times daily.    Marland Kitchen zolpidem (AMBIEN) 10 MG tablet Take 10 mg by mouth at bedtime as needed (sleep).      No current facility-administered medications for this visit.    Facility-Administered Medications Ordered in Other Visits  Medication Dose Route Frequency Provider Last Rate Last Dose  . heparin lock flush 100 unit/mL  500 Units Intravenous Once Lloyd Huger, MD      . sodium chloride flush (NS) 0.9 % injection 10 mL  10 mL Intravenous PRN Lloyd Huger, MD   10 mL at 03/20/18 1027    VITAL SIGNS: There were no vitals taken for this visit. There were no vitals filed for this visit.  Estimated body mass index is 30.82 kg/m as calculated from the following:   Height as of 03/13/18: 5' 3" (1.6 m).   Weight as of an earlier encounter on 03/20/18: 174 lb (78.9 kg).  LABS: CBC:    Component Value Date/Time   WBC 8.2  03/20/2018 0837   HGB 8.6 (L) 03/20/2018 0837   HGB 13.2 12/20/2011 0927   HCT 28.5 (L) 03/20/2018 0837   HCT 40.6 12/20/2011 0927   PLT 294 03/20/2018 0837   PLT 235 12/20/2011 0927   MCV 102.2 (H) 03/20/2018 0837   MCV 96 12/20/2011 0927   NEUTROABS 6.2 03/20/2018 0837   NEUTROABS 1.7 12/20/2011 0927   LYMPHSABS 0.8 03/20/2018 0837   LYMPHSABS 1.4 12/20/2011 0927   MONOABS 1.0 03/20/2018 0837   MONOABS 0.4 12/20/2011 0927   EOSABS 0.0 03/20/2018 0837   EOSABS 0.4 12/20/2011 0927   BASOSABS 0.0 03/20/2018 0837   BASOSABS 0.0 12/20/2011 0927   Comprehensive Metabolic Panel:    Component Value Date/Time   NA 136 03/20/2018 0837   NA 139 12/20/2011 0927   K 4.7 03/20/2018 0837   K 4.1 12/20/2011 0927   CL 105 03/20/2018 0837   CL 102 12/20/2011 0927   CO2 22 03/20/2018 0837   CO2 26 12/20/2011 0927   BUN 21 03/20/2018 0837   BUN 19 (H) 12/20/2011 0927   CREATININE 1.09 (H) 03/20/2018 0837   CREATININE 1.15 12/20/2011 0927   GLUCOSE 171 (H) 03/20/2018 0837   GLUCOSE 251 (H) 12/20/2011 0927   CALCIUM 8.7 (L) 03/20/2018 0837   CALCIUM 8.6 12/20/2011 0927   AST 166 (H) 03/20/2018 0837   AST 49 (H) 12/20/2011 0927   ALT 49 (H) 03/20/2018 0837   ALT 57 12/20/2011 0927   ALKPHOS 275 (H) 03/20/2018 0837   ALKPHOS 61 12/20/2011 0927   BILITOT 0.7 03/20/2018 0837   BILITOT 0.4 12/20/2011 0927   PROT 5.9 (L) 03/20/2018 0837   PROT 6.8 12/20/2011 0927   ALBUMIN 3.0 (L) 03/20/2018 0837   ALBUMIN 3.6 12/20/2011 0927    RADIOGRAPHIC STUDIES: Dg Chest 2 View  Result Date: 03/03/2018 CLINICAL DATA:   Three-day history of gradually worsening shortness of breath. Current history of metastatic inflammatory breast cancer for which the patient received her most recent chemotherapy on 02/27/2018. EXAM: CHEST - 2 VIEW COMPARISON:  None. FINDINGS: AP ERECT and LATERAL images were obtained. Cardiac silhouette upper normal in size for AP technique. Thoracic aorta minimally atherosclerotic, unchanged. Hilar and mediastinal contours otherwise unremarkable. RIGHT jugular Port-A-Cath tip at the cavoatrial junction, unchanged. Lungs clear. Bronchovascular markings normal. Pulmonary vascularity normal. No visible pleural effusions. No pneumothorax. Prior RIGHT shoulder hemiarthroplasty with anatomic alignment. Remote healed BILATERAL rib fractures. IMPRESSION: No acute cardiopulmonary disease. Electronically Signed   By: Evangeline Dakin M.D.   On: 03/03/2018 16:54   Ct Angio Chest Pe W Or Wo Contrast  Result Date: 03/03/2018 CLINICAL DATA:  68 year old female with shortness of breath. Metastatic breast cancer, recently started on New chemotherapy. EXAM: CT ANGIOGRAPHY CHEST WITH CONTRAST TECHNIQUE: Multidetector CT imaging of the chest was performed using the standard protocol during bolus administration of intravenous contrast. Multiplanar CT image reconstructions and MIPs were obtained to evaluate the vascular anatomy. CONTRAST:  72m OMNIPAQUE IOHEXOL 350 MG/ML SOLN COMPARISON:  Chest radiographs earlier today. CT Abdomen and Pelvis 02/20/2018. Chest CTA 11/03/2017. FINDINGS: Cardiovascular: Adequate contrast bolus timing in the pulmonary arterial tree. No focal filling defect identified in the pulmonary arteries to suggest acute pulmonary embolism. Calcified coronary artery atherosclerosis. Negative visible aorta aside from mild calcified plaque in the upper abdomen. Stable mild cardiomegaly. No pericardial effusion. Right chest porta cath. Mediastinum/Nodes: Negative, no lymphadenopathy. Lungs/Pleura: Small or trace  layering left pleural effusion. Chronic scarring in the anterior left upper lobe, perhaps  radiation related. Peribronchial ground-glass opacity elsewhere seen in October has virtually resolved. The trachea and major airways are patent. No pulmonary consolidation. No new abnormal pulmonary opacity. Upper Abdomen: Stable elevation of the right hemidiaphragm. Heterogeneous enhancement of the liver was better demonstrated on 02/20/2018. The visible spleen, pancreas, adrenal glands, left kidney remain normal. Small gastric hiatal hernia. Negative visible bowel. Musculoskeletal: Stable left chest wall since October. Widespread skeletal metastases. Superimposed right shoulder arthroplasty. No pathologic fracture identified. Review of the MIP images confirms the above findings. IMPRESSION: 1.  Negative for acute pulmonary embolus. 2. Trace layering left pleural effusion but no other acute findings in the chest. 3. Widespread skeletal and hepatic metastatic disease. Electronically Signed   By: Genevie Ann M.D.   On: 03/03/2018 21:44   Mr Liver W HC Contrast  Result Date: 02/22/2018 CLINICAL DATA:  Metastatic breast cancer.  Followup liver lesions. EXAM: MRI ABDOMEN WITHOUT AND WITH CONTRAST TECHNIQUE: Multiplanar multisequence MR imaging of the abdomen was performed both before and after the administration of intravenous contrast. CONTRAST:  8 cc Gadavist. COMPARISON:  CT AP 12/18/2017 FINDINGS: Lower chest: Small bilateral pleural effusions appear increased from previous exam. Hepatobiliary: There are innumerable liver lesions identified throughout both lobes of liver compatible with widespread hepatic metastasis. These lesions are too numerous to count and are seen thoughout both lobes. This demonstrates a significant interval progression when compared with 12/18/17. A few sample lesions as follows: -large confluent mass within the dome of liver involving segments 4 and segment 2 measures 11.6 by 4.1 by 2.6 cm. New from  previous exam. -the previous index lesion within segment 5 measures 4.0 by 3.9 cm, image 20/9. Previously 2.0 x 1.1 cm. -new lesion along the dome of segment 8 measures 1.8 x 1.8 cm, image 6/9. -new lesion within segment 6 measures 1.9 x 1.9 cm, image 28/9. Mild edema involving the wall of gallbladder.  Nonspecific. Pancreas: No mass, inflammatory changes, or other parenchymal abnormality identified. Spleen:  Within normal limits in size and appearance. Adrenals/Urinary Tract: Normal appearance of the adrenal glands. There is no kidney mass or hydronephrosis identified bilaterally. Mild bilateral renal cortical thinning identified. Stomach/Bowel: Visualized portions within the abdomen are unremarkable. Vascular/Lymphatic: Aortic atherosclerosis. No aneurysm. The portal vein remains patent. No significant adenopathy Other:  Small volume of perihepatic ascites is identified. Musculoskeletal: Multifocal bone metastases are identified. IMPRESSION: 1. Marked interval progression of liver metastases. 2. Increase in volume of pleural effusions. 3. Perihepatic ascites 4. Diffuse bone metastasis Electronically Signed   By: Kerby Moors M.D.   On: 02/22/2018 09:46   Ct Abdomen W Wo Contrast  Result Date: 02/20/2018 CLINICAL DATA:  68 year old female with a history of stage IV metastatic breast cancer with a solitary liver metastasis. She presents today for CT-guided percutaneous thermal ablation. EXAM: CT ABDOMEN WITHOUT AND WITH CONTRAST TECHNIQUE: Multidetector CT imaging of the abdomen was performed following the standard protocol before and following the bolus administration of intravenous contrast. CONTRAST:  156m ISOVUE-370 IOPAMIDOL (ISOVUE-370) INJECTION 76% COMPARISON:  Prior CT scan of the abdomen and pelvis 12/18/2017 FINDINGS: Lower chest: Dependent atelectasis in both lower lobes with small bilateral pleural effusions. Mild cardiomegaly with a small pericardial effusion. The intracardiac blood pool is  hypodense relative to the adjacent myocardium consistent with anemia. Hepatobiliary: The liver remains similar in size and configuration although there is now some slight nodularity of the hepatic contour which was not definitively appreciated on prior imaging. The liver is diffusely heterogeneous on the precontrast  images. Following administration of arterial contrast, there is an extremely heterogeneous perfusion pattern of the liver with multifocal areas of hypoenhancement, some of which is confluent in nature. The previously identified lesion in hepatic segment 5 has enlarged and now measures approximately 2.9 x 1.8 cm. On the portal venous phase imaging, the liver remains extremely heterogeneous in appearance. The known lesion in segment 5 remains similar in size. There are other masslike areas of relatively low attenuation which are more amorphous and difficult to measure discretely. The gallbladder is contracted. No biliary ductal dilatation. Pancreas: Unremarkable. No pancreatic ductal dilatation or surrounding inflammatory changes. Spleen: Normal in size without focal abnormality. Adrenals/Urinary Tract: Normal adrenal glands. No evidence of hydronephrosis or enhancing renal lesion. Parapelvic renal sinus cysts noted on the left. Nonobstructing punctate stone in the right lower pole collecting system, unchanged. Stomach/Bowel: No focal bowel wall thickening or evidence of obstruction. Vascular/Lymphatic: Calcifications present in the abdominal aorta. Other: No abdominal wall hernia or abnormality. Musculoskeletal: Multifocal sclerotic osseous lesions again noted consistent with known osseous metastatic disease. Similar appearance of left mastectomy surgical site with small residual seroma. No significant interval change. IMPRESSION: Unfortunately, there is been interval development of extreme heterogeneity of the hepatic parenchyma over the relatively short interval compared to the prior CT scan dated  12/18/2017. Correlation with intra procedural ultrasound imaging demonstrates multifocal hypoechoic solid masses throughout the liver several of which have a more infiltrative appearance. The overall imaging appearance is most consistent with significant interval progression of hepatic metastatic disease much of which now has a diffusely infiltrative appearance. The only other non malignant possibility would be sudden interval development of extensive masslike fatty infiltration which would be atypical over such a short interval. If further imaging is clinically warranted, MRI of the abdomen with and without gadolinium contrast could confirm. Electronically Signed   By: Jacqulynn Cadet M.D.   On: 02/20/2018 14:29    PERFORMANCE STATUS (ECOG) : 1 - Symptomatic but completely ambulatory  Review of Systems As noted above. Otherwise, a complete review of systems is negative.  Physical Exam General: NAD, frail appearing Pulmonary: unlabored, CTA ant fields Cards: RRR Abd: soft, nttp Extremities: no edema, no joint deformities Skin: no rashes Neurological: Weakness but otherwise nonfocal  IMPRESSION: I met with patient and her husband today in the clinic.   Patient relatively unchanged since last seen.  She continues to have persistent fatigue and poor oral intake.  Patient says that she felt remarkably good following previous dose of dexamethasone.  Dr. Grayland Ormond has ordered dexamethasone daily.  Agree with this plan.  Hopefully, steroids will help improve fatigue and oral intake.  Patient says pain is reasonably controlled on current regimen.  She is using CBD oil with good effect.  Again discussed referral to RD.  Both patient and husband seem to recognize that they may be nearing end of treatment options.  However, for now they seem committed to pursuing treatment if available.  Will follow and continue to address goals.  PLAN: -Continue current scope of treatment -Continue oxycodone prn  for pain -Referral to RD -RTC in 1-2 weeks   Patient expressed understanding and was in agreement with this plan. She also understands that She can call clinic at any time with any questions, concerns, or complaints.    Time Total: 20 minutes  Visit consisted of counseling and education dealing with the complex and emotionally intense issues of symptom management and palliative care in the setting of serious and potentially life-threatening illness.Greater  than 50%  of this time was spent counseling and coordinating care related to the above assessment and plan.  Signed by: Altha Harm, PhD, NP-C [phone] (Work Cell)

## 2018-03-20 NOTE — Progress Notes (Signed)
Per Tillie Rung. Dr. Grayland Ormond states ok to proceed with treatment despite elevated liver enzymes.

## 2018-03-22 ENCOUNTER — Inpatient Hospital Stay: Payer: Medicare Other

## 2018-03-22 ENCOUNTER — Other Ambulatory Visit: Payer: Self-pay | Admitting: *Deleted

## 2018-03-22 ENCOUNTER — Other Ambulatory Visit: Payer: Self-pay

## 2018-03-22 ENCOUNTER — Inpatient Hospital Stay (HOSPITAL_BASED_OUTPATIENT_CLINIC_OR_DEPARTMENT_OTHER): Payer: Medicare Other | Admitting: Oncology

## 2018-03-22 ENCOUNTER — Telehealth: Payer: Self-pay

## 2018-03-22 ENCOUNTER — Encounter: Payer: Self-pay | Admitting: Oncology

## 2018-03-22 VITALS — BP 112/73 | HR 90 | Temp 98.0°F | Resp 18

## 2018-03-22 DIAGNOSIS — I959 Hypotension, unspecified: Secondary | ICD-10-CM

## 2018-03-22 DIAGNOSIS — R531 Weakness: Secondary | ICD-10-CM

## 2018-03-22 DIAGNOSIS — C7951 Secondary malignant neoplasm of bone: Secondary | ICD-10-CM | POA: Diagnosis not present

## 2018-03-22 DIAGNOSIS — C50919 Malignant neoplasm of unspecified site of unspecified female breast: Secondary | ICD-10-CM | POA: Diagnosis not present

## 2018-03-22 DIAGNOSIS — C787 Secondary malignant neoplasm of liver and intrahepatic bile duct: Secondary | ICD-10-CM

## 2018-03-22 DIAGNOSIS — Z5111 Encounter for antineoplastic chemotherapy: Secondary | ICD-10-CM | POA: Diagnosis not present

## 2018-03-22 DIAGNOSIS — R42 Dizziness and giddiness: Secondary | ICD-10-CM | POA: Diagnosis not present

## 2018-03-22 DIAGNOSIS — R350 Frequency of micturition: Secondary | ICD-10-CM | POA: Diagnosis not present

## 2018-03-22 DIAGNOSIS — R748 Abnormal levels of other serum enzymes: Secondary | ICD-10-CM | POA: Diagnosis not present

## 2018-03-22 DIAGNOSIS — E860 Dehydration: Secondary | ICD-10-CM

## 2018-03-22 LAB — COMPREHENSIVE METABOLIC PANEL
ALBUMIN: 3.2 g/dL — AB (ref 3.5–5.0)
ALT: 68 U/L — ABNORMAL HIGH (ref 0–44)
AST: 230 U/L — ABNORMAL HIGH (ref 15–41)
Alkaline Phosphatase: 282 U/L — ABNORMAL HIGH (ref 38–126)
Anion gap: 6 (ref 5–15)
BUN: 29 mg/dL — ABNORMAL HIGH (ref 8–23)
CO2: 24 mmol/L (ref 22–32)
Calcium: 8.6 mg/dL — ABNORMAL LOW (ref 8.9–10.3)
Chloride: 109 mmol/L (ref 98–111)
Creatinine, Ser: 1.06 mg/dL — ABNORMAL HIGH (ref 0.44–1.00)
GFR calc Af Amer: >60 mL/min (ref >60)
GFR calc non Af Amer: 55 mL/min — ABNORMAL LOW (ref >60)
Glucose, Bld: 117 mg/dL — ABNORMAL HIGH (ref 70–99)
Potassium: 4 mmol/L (ref 3.5–5.1)
Sodium: 139 mmol/L (ref 135–145)
Total Bilirubin: 0.7 mg/dL (ref 0.3–1.2)
Total Protein: 6.1 g/dL — ABNORMAL LOW (ref 6.5–8.1)

## 2018-03-22 LAB — CBC WITH DIFFERENTIAL/PLATELET
Abs Immature Granulocytes: 0.03 10*3/uL (ref 0.00–0.07)
Basophils Absolute: 0 10*3/uL (ref 0.0–0.1)
Basophils Relative: 0 %
Eosinophils Absolute: 0 10*3/uL (ref 0.0–0.5)
Eosinophils Relative: 0 %
HCT: 26.9 % — ABNORMAL LOW (ref 36.0–46.0)
HEMOGLOBIN: 8.3 g/dL — AB (ref 12.0–15.0)
IMMATURE GRANULOCYTES: 0 %
Lymphocytes Relative: 5 %
Lymphs Abs: 0.3 10*3/uL — ABNORMAL LOW (ref 0.7–4.0)
MCH: 31.1 pg (ref 26.0–34.0)
MCHC: 30.9 g/dL (ref 30.0–36.0)
MCV: 100.7 fL — ABNORMAL HIGH (ref 80.0–100.0)
Monocytes Absolute: 0.2 10*3/uL (ref 0.1–1.0)
Monocytes Relative: 3 %
NEUTROS PCT: 92 %
Neutro Abs: 6.3 10*3/uL (ref 1.7–7.7)
Platelets: 273 10*3/uL (ref 150–400)
RBC: 2.67 MIL/uL — ABNORMAL LOW (ref 3.87–5.11)
RDW: 16.7 % — ABNORMAL HIGH (ref 11.5–15.5)
WBC: 6.9 10*3/uL (ref 4.0–10.5)
nRBC: 0 % (ref 0.0–0.2)

## 2018-03-22 LAB — URINALYSIS, COMPLETE (UACMP) WITH MICROSCOPIC
BACTERIA UA: NONE SEEN
Bilirubin Urine: NEGATIVE
Glucose, UA: NEGATIVE mg/dL
Hgb urine dipstick: NEGATIVE
Ketones, ur: NEGATIVE mg/dL
Leukocytes,Ua: NEGATIVE
Nitrite: NEGATIVE
Protein, ur: 30 mg/dL — AB
SPECIFIC GRAVITY, URINE: 1.019 (ref 1.005–1.030)
pH: 6 (ref 5.0–8.0)

## 2018-03-22 LAB — MAGNESIUM: MAGNESIUM: 2.4 mg/dL (ref 1.7–2.4)

## 2018-03-22 MED ORDER — SODIUM CHLORIDE 0.9 % IV SOLN
Freq: Once | INTRAVENOUS | Status: AC
  Administered 2018-03-22: 15:00:00 via INTRAVENOUS
  Filled 2018-03-22: qty 250

## 2018-03-22 MED ORDER — SODIUM CHLORIDE 0.9% FLUSH
10.0000 mL | Freq: Once | INTRAVENOUS | Status: AC
  Administered 2018-03-22: 10 mL via INTRAVENOUS
  Filled 2018-03-22: qty 10

## 2018-03-22 MED ORDER — HEPARIN SOD (PORK) LOCK FLUSH 100 UNIT/ML IV SOLN
500.0000 [IU] | Freq: Once | INTRAVENOUS | Status: AC
  Administered 2018-03-22: 500 [IU] via INTRAVENOUS

## 2018-03-22 NOTE — Progress Notes (Signed)
Patient here today for add on in symptom management clinic. Patient and husband report weakness and issues with balance.

## 2018-03-22 NOTE — Telephone Encounter (Signed)
Patient's husband [name] called stating that he wanted to speak to you in reference to his wife Amanda [name]. He state dthat she is not having a good day so to please give him a call.

## 2018-03-22 NOTE — Progress Notes (Signed)
Symptom Management Consult note [facility]  [phone] [phone]  Patient Care Team: Sofie Hartigan, MD as PCP - General (Family Medicine) Jules Husbands, MD as Consulting Physician (General Surgery)   Name of the patient: Amanda [name]  [id number]  1950/04/06   Date of visit: 03/22/2018   Diagnosis-metastatic breast cancer  Chief complaint/ Reason for visit- Dizziness/weakness  Heme/Onc history: Patient last seen by primary oncologist Dr. Grayland Ormond on 03/20/2018 for further evaluation and consideration of Halaven.  She continued to have a declining performance status with chronic weakness and fatigue.  Complained of poor appetite and chronic peripheral neuropathy.  Unfortunately, patient's treatment has been postponed several times due to neutropenia and/or declining performance status.  She has been hospitalized on several occasions.  She was admitted to [facility] (03/03/18-03/04/18) for weakness, shortness of breath and dizziness.  No clear source of infection and she was negative for a PE.  She received IV fluids and was admitted for dehydration X 1 day and discharged.  Evaluated by Merrily Pew borders and palliative medicine on 03/13/2018 to address goals of care.  They addressed her CODE STATUS and adjusted her oxycodone for better pain coverage.   Interval history-today patient complains of dizziness and increased urinary frequency x1 day.  Her husband noticed symptoms yesterday and was worried to leave her alone fearful she may fall.  She denies confusion.  Has not started or stopped any medications recently.  Denies pain. Occasional loose stools; not different from her baseline. Denies recent fevers or illnesses. Denies any easy bleeding or bruising. Reports an average appetite. Denies chest pain. Denies any nausea, vomiting or constipation. Denies dysuria. Patient offers no further specific complaints today.  ECOG FS:1 - Symptomatic but  completely ambulatory  Review of systems- Review of Systems  Constitutional: Positive for malaise/fatigue. Negative for chills, fever and weight loss.  HENT: Negative for congestion, ear pain and tinnitus.   Eyes: Negative.  Negative for blurred vision and double vision.  Respiratory: Negative.  Negative for cough, sputum production and shortness of breath.   Cardiovascular: Negative.  Negative for chest pain, palpitations and leg swelling.  Gastrointestinal: Positive for diarrhea. Negative for abdominal pain, constipation, nausea and vomiting.  Genitourinary: Positive for frequency. Negative for dysuria and urgency.  Musculoskeletal: Negative for back pain and falls.  Skin: Negative.  Negative for rash.  Neurological: Positive for dizziness and weakness. Negative for headaches.  Endo/Heme/Allergies: Negative.  Does not bruise/bleed easily.  Psychiatric/Behavioral: Negative.  Negative for depression. The patient is not nervous/anxious and does not have insomnia.      Current treatment- S/p Cycle 1 Halaven. Last given on 03/20/18.   Allergies  Allergen Reactions  . Ace Inhibitors Cough  . Latex Itching  . Morphine And Related Itching    Caused her to itch terribly. Would prefer if given to take with a benadryl  . Penicillins Rash    Has patient had a PCN reaction causing immediate rash, facial/tongue/throat swelling, SOB or lightheadedness with hypotension: No Has patient had a PCN reaction causing severe rash involving mucus membranes or skin necrosis: No Has patient had a PCN reaction that required hospitalization No Has patient had a PCN reaction occurring within the last 10 years: No If all of the above answers are "NO", then may proceed with Cephalosporin use.     Past Medical History:  Diagnosis Date  . Anxiety   . Back pain    occasionally  . Breast cancer ([place])  2009   left  . Depression    takes Paxil and Wellbutrin daily  . Diabetes ([place])    takes Metformin daily    type 2   . Genetic testing 02/03/2017   Multi-Cancer panel (83 genes) @ [facility] - No pathogenic mutations detected  . GERD (gastroesophageal reflux disease)    occasional  . History of bronchitis 2 yrs ago  . History of kidney stones   . Hyperlipidemia    takes Atorvastatin daily  . Hypertension    no meds  . Insomnia    takes Ambien nightly  . Insomnia    takes gabapentin nightly  . Joint pain   . Mood swings   . Osteoarthritis of knee   . Personal history of chemotherapy 12/05/2016   Mets from Breast Cancer  . Personal history of radiation therapy 11/2016  . Pneumonia   . PONV (postoperative nausea and vomiting)   . Seasonal allergies    takes Allegra daily     Past Surgical History:  Procedure Laterality Date  . BREAST BIOPSY  2009  . cataract surgery Bilateral   . COLONOSCOPY    . IR RADIOLOGIST EVAL & MGMT  01/29/2018  . JOINT REPLACEMENT Left 2014   knee  . KNEE ARTHROSCOPY Left    x 5  . MASTECTOMY Left   . port a cath placed    . PORTACATH PLACEMENT N/A 09/17/2015   Procedure: INSERTION PORT-A-CATH;  Surgeon: Jules Husbands, MD;  Location: [facility] ORS;  Service: General;  Laterality: N/A;  . RADIOLOGY WITH ANESTHESIA N/A 02/20/2018   Procedure: CT WITH ANESTHESIA MICROWAVE THERMAL ABLATION-LIVER;  Surgeon: Jacqulynn Cadet, MD;  Location: [facility] ORS;  Service: Anesthesiology;  Laterality: N/A;  . TOOTH EXTRACTION    . TOTAL SHOULDER ARTHROPLASTY Right 06/03/2015   Procedure: TOTAL SHOULDER ARTHROPLASTY;  Surgeon: Tania Ade, MD;  Location: [facility];  Service: Orthopedics;  Laterality: Right;  Right total shoulder arthroplasty  . TOTAL SHOULDER REPLACEMENT Right 06/03/2015  . TUBAL LIGATION      Social History   Socioeconomic History  . Marital status: Married    Spouse name: Not on file  . Number of children: Not on file  . Years of education: Not on file  . Highest education level: Not on file  Occupational History  . Not on file  Social Needs  . Financial  resource strain: Patient refused  . Food insecurity:    Worry: Patient refused    Inability: Patient refused  . Transportation needs:    Medical: Patient refused    Non-medical: Patient refused  Tobacco Use  . Smoking status: Never Smoker  . Smokeless tobacco: Never Used  Substance and Sexual Activity  . Alcohol use: Yes    Alcohol/week: 0.0 standard drinks    Comment: occasionally wine  . Drug use: Yes    Types: Marijuana    Comment: cannabis with no extra  low dose edibles  for neuropathy  . Sexual activity: Yes    Birth control/protection: Post-menopausal  Lifestyle  . Physical activity:    Days per week: Patient refused    Minutes per session: Patient refused  . Stress: Patient refused  Relationships  . Social connections:    Talks on phone: Patient refused    Gets together: Patient refused    Attends religious service: Patient refused    Active member of club or organization: Patient refused    Attends meetings of clubs or organizations: Patient refused    Relationship  status: Patient refused  . Intimate partner violence:    Fear of current or ex partner: Patient refused    Emotionally abused: Patient refused    Physically abused: Patient refused    Forced sexual activity: Patient refused  Other Topics Concern  . Not on file  Social History Narrative  . Not on file    Family History  Problem Relation Age of Onset  . Atrial fibrillation Mother        deceased 32; TAH/BSO  . Non-Hodgkin's lymphoma Father        deceased 62  . Heart attack Maternal Uncle   . Melanoma Maternal Uncle   . Breast cancer Paternal Aunt        dx in 73s  . Breast cancer Paternal Aunt        dx in 44s  . Cancer Brother 90       unk. type  . Breast cancer Other        Mat grandmother's 2 sisters; dx at 41 and 46  . Stomach cancer Paternal Grandfather 53       deceased 67s  . Breast cancer Cousin        daughter of pat aunt with breast ca     Current Outpatient Medications:    .  acetaminophen (TYLENOL) 500 MG tablet, Take 500 mg by mouth every 6 (six) hours as needed for mild pain or moderate pain. , Disp: , Rfl:  .  atorvastatin (LIPITOR) 10 MG tablet, Take 10 mg by mouth at bedtime. , Disp: , Rfl:  .  buPROPion (WELLBUTRIN XL) 150 MG 24 hr tablet, Take 150 mg by mouth at bedtime. , Disp: , Rfl:  .  Calcium-Magnesium-Vitamin D (CALCIUM 1200+D3 PO), Take 1 tablet by mouth daily. , Disp: , Rfl:  .  Cyanocobalamin 5000 MCG CAPS, Take 5,000 mcg by mouth daily. , Disp: , Rfl:  .  dexamethasone (DECADRON) 4 MG tablet, Take 1 tablet (4 mg total) by mouth daily., Disp: 30 tablet, Rfl: 1 .  ferrous sulfate 325 (65 FE) MG EC tablet, Take 325 mg by mouth daily. , Disp: , Rfl:  .  fexofenadine (ALLEGRA) 180 MG tablet, Take 180 mg by mouth at bedtime. , Disp: , Rfl:  .  gabapentin (NEURONTIN) 300 MG capsule, Take 300-600 mg by mouth See admin instructions. Take 300 mg by mouth in the morning and 600 mg at night, Disp: , Rfl:  .  lidocaine-prilocaine (EMLA) cream, Apply 1 application topically as needed. Apply to port 1-2 hours prior to chemotherapy appointment. Cover with plastic wrap., Disp: 30 g, Rfl: 0 .  metFORMIN (GLUCOPHAGE) 850 MG tablet, Take 850 mg by mouth 2 (two) times daily with a meal. , Disp: , Rfl:  .  Oxycodone HCl 10 MG TABS, Take 1 tablet (10 mg total) by mouth every 4 (four) hours as needed. Ok to take every 4-6 hours as needed., Disp: 90 tablet, Rfl: 0 .  pantoprazole (PROTONIX) 40 MG tablet, Take 40 mg by mouth at bedtime. , Disp: , Rfl:  .  PARoxetine (PAXIL) 40 MG tablet, Take 40 mg by mouth at bedtime. , Disp: , Rfl:  .  prochlorperazine (COMPAZINE) 10 MG tablet, Take 1 tablet (10 mg total) by mouth every 6 (six) hours as needed for nausea or vomiting., Disp: 30 tablet, Rfl: 3 .  sodium chloride (MURO 128) 5 % ophthalmic solution, Place 1 drop into both eyes 4 (four) times daily., Disp: , Rfl:  .  zolpidem (AMBIEN) 10 MG tablet, Take 10 mg by mouth at  bedtime as needed (sleep). , Disp: , Rfl:   Physical exam:  Vitals:   03/22/18 1459  BP: 112/73  Pulse: 90  Resp: 18  Temp: 98 F (36.7 C)  TempSrc: Tympanic   Physical Exam Nursing note reviewed.  Constitutional:      Appearance: Normal appearance.  HENT:     Head: Normocephalic and atraumatic.  Eyes:     Pupils: Pupils are equal, round, and reactive to light.  Neck:     Musculoskeletal: Normal range of motion.  Cardiovascular:     Rate and Rhythm: Normal rate and regular rhythm.     Heart sounds: Normal heart sounds. No murmur.  Pulmonary:     Effort: Pulmonary effort is normal.     Breath sounds: Normal breath sounds. No wheezing.  Abdominal:     General: Bowel sounds are normal. There is no distension.     Palpations: Abdomen is soft.     Tenderness: There is no abdominal tenderness.  Musculoskeletal: Normal range of motion.  Skin:    General: Skin is warm and dry.     Coloration: Skin is pale.     Findings: No rash.  Neurological:     Mental Status: She is alert and oriented to person, place, and time.  Psychiatric:        Judgment: Judgment normal.      CMP Latest Ref Rng & Units 03/22/2018  Glucose 70 - 99 mg/dL 117(H)  BUN 8 - 23 mg/dL 29(H)  Creatinine 0.44 - 1.00 mg/dL 1.06(H)  Sodium 135 - 145 mmol/L 139  Potassium 3.5 - 5.1 mmol/L 4.0  Chloride 98 - 111 mmol/L 109  CO2 22 - 32 mmol/L 24  Calcium 8.9 - 10.3 mg/dL 8.6(L)  Total Protein 6.5 - 8.1 g/dL 6.1(L)  Total Bilirubin 0.3 - 1.2 mg/dL 0.7  Alkaline Phos 38 - 126 U/L 282(H)  AST 15 - 41 U/L 230(H)  ALT 0 - 44 U/L 68(H)   CBC Latest Ref Rng & Units 03/22/2018  WBC 4.0 - 10.5 K/uL 6.9  Hemoglobin 12.0 - 15.0 g/dL 8.3(L)  Hematocrit 36.0 - 46.0 % 26.9(L)  Platelets 150 - 400 K/uL 273    No images are attached to the encounter.  Dg Chest 2 View  Result Date: 03/03/2018 CLINICAL DATA:  Three-day history of gradually worsening shortness of breath. Current history of metastatic inflammatory  breast cancer for which the patient received her most recent chemotherapy on 02/27/2018. EXAM: CHEST - 2 VIEW COMPARISON:  None. FINDINGS: AP ERECT and LATERAL images were obtained. Cardiac silhouette upper normal in size for AP technique. Thoracic aorta minimally atherosclerotic, unchanged. Hilar and mediastinal contours otherwise unremarkable. RIGHT jugular Port-A-Cath tip at the cavoatrial junction, unchanged. Lungs clear. Bronchovascular markings normal. Pulmonary vascularity normal. No visible pleural effusions. No pneumothorax. Prior RIGHT shoulder hemiarthroplasty with anatomic alignment. Remote healed BILATERAL rib fractures. IMPRESSION: No acute cardiopulmonary disease. Electronically Signed   By: Evangeline Dakin M.D.   On: 03/03/2018 16:54   Ct Angio Chest Pe W Or Wo Contrast  Result Date: 03/03/2018 CLINICAL DATA:  68 year old female with shortness of breath. Metastatic breast cancer, recently started on New chemotherapy. EXAM: CT ANGIOGRAPHY CHEST WITH CONTRAST TECHNIQUE: Multidetector CT imaging of the chest was performed using the standard protocol during bolus administration of intravenous contrast. Multiplanar CT image reconstructions and MIPs were obtained to evaluate the vascular anatomy. CONTRAST:  13m OMNIPAQUE IOHEXOL 350  MG/ML SOLN COMPARISON:  Chest radiographs earlier today. CT Abdomen and Pelvis 02/20/2018. Chest CTA 11/03/2017. FINDINGS: Cardiovascular: Adequate contrast bolus timing in the pulmonary arterial tree. No focal filling defect identified in the pulmonary arteries to suggest acute pulmonary embolism. Calcified coronary artery atherosclerosis. Negative visible aorta aside from mild calcified plaque in the upper abdomen. Stable mild cardiomegaly. No pericardial effusion. Right chest porta cath. Mediastinum/Nodes: Negative, no lymphadenopathy. Lungs/Pleura: Small or trace layering left pleural effusion. Chronic scarring in the anterior left upper lobe, perhaps radiation  related. Peribronchial ground-glass opacity elsewhere seen in October has virtually resolved. The trachea and major airways are patent. No pulmonary consolidation. No new abnormal pulmonary opacity. Upper Abdomen: Stable elevation of the right hemidiaphragm. Heterogeneous enhancement of the liver was better demonstrated on 02/20/2018. The visible spleen, pancreas, adrenal glands, left kidney remain normal. Small gastric hiatal hernia. Negative visible bowel. Musculoskeletal: Stable left chest wall since October. Widespread skeletal metastases. Superimposed right shoulder arthroplasty. No pathologic fracture identified. Review of the MIP images confirms the above findings. IMPRESSION: 1.  Negative for acute pulmonary embolus. 2. Trace layering left pleural effusion but no other acute findings in the chest. 3. Widespread skeletal and hepatic metastatic disease. Electronically Signed   By: Genevie Ann M.D.   On: 03/03/2018 21:44     Assessment and plan- Patient is a 68 y.o. female who presents to [facility] for new onset dizziness x1 day and urinary frequency x1 day.   Metastatic breast cancer: S/p cycle 1 of Halaven.  Unfortunately, MRI results from 02/22/2018 revealed rapidly progressive innumerable metastatic lesions to patient's liver.  Wishes to pursue continued palliative treatment.  Discontinued Ibrance and letrozole and switched to current therapy using Halaven.  Continue to complain of decreased performance status.  Received cycle 1 day 1 on 02/27/2018 and cycle 1 day 8 on 03/20/2018. Interruption in treatment d/t hospitalization for dehydration. Scheduled to RTC on 03/27/2018 for reevaluation prior to cycle 2 and f/u evaluation by palliative care.  Dizziness: Likely d/t dehydration.  Patient is orthostatic in clinic today.  Appetite is fair.  1 L NaCl given. See notes below.  Increased urinary frequency: Will check urinalysis.  Negative.  See notes below.  Increasing Liver enzymes: D/t metastatic disease to liver.   Recent imaging did reveal worsening disease in her liver.  Continue to monitor.  We will see if labs improve with additional treatment.  Plan: Stat labs. (hgb 8.3, BUN 29, creatinine 1.06 AST 230, ALT 68, alk phos 282) Vital signs.  Orthostatic hypotension.  Greater than 20 point drop from sitting to standing.  Improved with fluids. Urinalysis/urine culture.  Negative for UTI.  In clinic administration: 1 L NaCl.  New/changed prescriptions: Samples given of Ensure electrolyte replacement.  Encouraged her to drink plenty of fluids.  Disposition: RTC as scheduled for 03/27/2018 to see Dr. Grayland Ormond and Vonna Kotyk borders, NP.  Visit Diagnosis 1. Dizziness   2. Urinary frequency   3. Dehydration   4. Weakness   5. Metastatic breast cancer [facility])     Patient expressed understanding and was in agreement with this plan. She also understands that She can call clinic at any time with any questions, concerns, or complaints.   Greater than 50% was spent in counseling and coordination of care with this patient including but not limited to discussion of the relevant topics above (See A&P) including, but not limited to diagnosis and management of acute and chronic medical conditions.   Thank you for allowing me to participate  in the care of this very pleasant patient.    Jacquelin Hawking, NP [place] [facility] Cell - [phone] Pager- [phone] 03/25/2018 3:49 PM

## 2018-03-22 NOTE — Patient Instructions (Signed)
It was nice to see you today. I am sorry your are not feeling well.   Try and eat and drink plenty of fluids. Ensure supplements provided.   We will re-check your labs on Wednesday.    Dehydration, Adult  Dehydration is when there is not enough fluid or water in your body. This happens when you lose more fluids than you take in. Dehydration can range from mild to very bad. It should be treated right away to keep it from getting very bad. Symptoms of mild dehydration may include:  Thirst.  Dry lips.  Slightly dry mouth.  Dry, warm skin.  Dizziness. Symptoms of moderate dehydration may include:  Very dry mouth.  Muscle cramps.  Dark pee (urine). Pee may be the color of tea.  Your body making less pee.  Your eyes making fewer tears.  Heartbeat that is uneven or faster than normal (palpitations).  Headache.  Light-headedness, especially when you stand up from sitting.  Fainting (syncope). Symptoms of very bad dehydration may include:  Changes in skin, such as: ? Cold and clammy skin. ? Blotchy (mottled) or pale skin. ? Skin that does not quickly return to normal after being lightly pinched and let go (poor skin turgor).  Changes in body fluids, such as: ? Feeling very thirsty. ? Your eyes making fewer tears. ? Not sweating when body temperature is high, such as in hot weather. ? Your body making very little pee.  Changes in vital signs, such as: ? Weak pulse. ? Pulse that is more than 100 beats a minute when you are sitting still. ? Fast breathing. ? Low blood pressure.  Other changes, such as: ? Sunken eyes. ? Cold hands and feet. ? Confusion. ? Lack of energy (lethargy). ? Trouble waking up from sleep. ? Short-term weight loss. ? Unconsciousness. Follow these instructions at home:   If told by your doctor, drink an ORS: ? Make an ORS by using instructions on the package. ? Start by drinking small amounts, about  cup (120 mL) every 5-10  minutes. ? Slowly drink more until you have had the amount that your doctor said to have.  Drink enough clear fluid to keep your pee clear or pale yellow. If you were told to drink an ORS, finish the ORS first, then start slowly drinking clear fluids. Drink fluids such as: ? Water. Do not drink only water by itself. Doing that can make the salt (sodium) level in your body get too low (hyponatremia). ? Ice chips. ? Fruit juice that you have added water to (diluted). ? Low-calorie sports drinks.  Avoid: ? Alcohol. ? Drinks that have a lot of sugar. These include high-calorie sports drinks, fruit juice that does not have water added, and soda. ? Caffeine. ? Foods that are greasy or have a lot of fat or sugar.  Take over-the-counter and prescription medicines only as told by your doctor.  Do not take salt tablets. Doing that can make the salt level in your body get too high (hypernatremia).  Eat foods that have minerals (electrolytes). Examples include bananas, oranges, potatoes, tomatoes, and spinach.  Keep all follow-up visits as told by your doctor. This is important. Contact a doctor if:  You have belly (abdominal) pain that: ? Gets worse. ? Stays in one area (localizes).  You have a rash.  You have a stiff neck.  You get angry or annoyed more easily than normal (irritability).  You are more sleepy than normal.  You have a  harder time waking up than normal.  You feel: ? Weak. ? Dizzy. ? Very thirsty.  You have peed (urinated) only a small amount of very dark pee during 6-8 hours. Get help right away if:  You have symptoms of very bad dehydration.  You cannot drink fluids without throwing up (vomiting).  Your symptoms get worse with treatment.  You have a fever.  You have a very bad headache.  You are throwing up or having watery poop (diarrhea) and it: ? Gets worse. ? Does not go away.  You have blood or something green (bile) in your throw-up.  You have  blood in your poop (stool). This may cause poop to look black and tarry.  You have not peed in 6-8 hours.  You pass out (faint).  Your heart rate when you are sitting still is more than 100 beats a minute.  You have trouble breathing. This information is not intended to replace advice given to you by your health care provider. Make sure you discuss any questions you have with your health care provider. Document Released: 10/29/2008 Document Revised: 07/23/2015 Document Reviewed: 02/26/2015 Elsevier Interactive Patient Education  2019 [employer].

## 2018-03-22 NOTE — Telephone Encounter (Signed)
Call returned to patients husband [name]. Patients husband reports patient is feeling terrible, severe weakness. Patient accepts appointment for this afternoon in [facility] to see Sonia Baller.

## 2018-03-23 LAB — URINE CULTURE: Culture: <10000 — AB

## 2018-03-24 NOTE — Progress Notes (Signed)
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  INTERVAL HISTORY: Patient returns to clinic today for further evaluation and consideration of her next infusion of Halaven.  She continues to have a poor appetite and weight loss.  She continues to have a decreased performance status and significant weakness and fatigue. She has a chronic peripheral neuropathy, but no other neurologic complaints.  She denies any fevers.  She denies any chest pain, cough, or hemoptysis. She denies any nausea, vomiting, constipation, or diarrhea. She has no urinary complaints.  Patient feels generally terrible, but offers no further specific complaints today.  REVIEW OF SYSTEMS:   Review of Systems  Constitutional: Positive for malaise/fatigue and weight loss. Negative for fever.  Eyes: Negative.  Negative for blurred vision, double vision and pain.  Respiratory: Negative.  Negative for cough and shortness of breath.   Cardiovascular: Negative.  Negative for chest pain and leg swelling.  Gastrointestinal: Negative.  Negative for abdominal pain.  Genitourinary: Negative.  Negative for dysuria and flank pain.  Musculoskeletal: Negative for back pain and falls.  Skin: Negative.  Negative for rash.  Neurological: Positive for tingling, sensory change and weakness. Negative for focal weakness and headaches.  Endo/Heme/Allergies: Negative.   Psychiatric/Behavioral: Negative.  Negative for memory loss. The patient is not nervous/anxious.     As per HPI. Otherwise, a complete review of systems is negative.  PAST MEDICAL HISTORY: Past Medical History:  Diagnosis Date  . Anxiety     . Back pain    occasionally  . Breast cancer ([place]) 2009   left  . Depression    takes Paxil and Wellbutrin daily  . Diabetes ([place])    takes Metformin daily   type 2   . Genetic testing 02/03/2017   Multi-Cancer panel (83 genes) @ [facility] - No pathogenic mutations detected  . GERD (gastroesophageal reflux disease)    occasional  . History of bronchitis 2 yrs ago  . History of kidney stones   . Hyperlipidemia    takes Atorvastatin daily  . Hypertension    no meds  . Insomnia    takes Ambien nightly  . Insomnia    takes gabapentin nightly  . Joint pain   . Mood swings   . Osteoarthritis of knee   . Personal history of chemotherapy 12/05/2016   Mets from Breast Cancer  . Personal history of radiation therapy 11/2016  . Pneumonia   . PONV (postoperative nausea and vomiting)   . Seasonal allergies    takes Allegra daily    PAST SURGICAL HISTORY: Past Surgical History:  Procedure Laterality Date  . BREAST BIOPSY  2009  . cataract surgery Bilateral   . COLONOSCOPY    . IR RADIOLOGIST EVAL & MGMT  01/29/2018  . JOINT REPLACEMENT Left 2014   knee  . KNEE ARTHROSCOPY Left    x 5  . MASTECTOMY Left   . port a cath placed    . PORTACATH PLACEMENT N/A 09/17/2015   Procedure: INSERTION PORT-A-CATH;  Surgeon: Jules Husbands, MD;  Location: [facility];  Service: General;  Laterality: N/A;  . RADIOLOGY WITH ANESTHESIA N/A 02/20/2018   Procedure: CT WITH ANESTHESIA MICROWAVE THERMAL ABLATION-LIVER;  Surgeon:  Jacqulynn Cadet, MD;  Location: [facility];  Service: Anesthesiology;  Laterality: N/A;  . TOOTH EXTRACTION    . TOTAL SHOULDER ARTHROPLASTY Right 06/03/2015   Procedure: TOTAL SHOULDER ARTHROPLASTY;  Surgeon: Tania Ade, MD;  Location: [place];  Service: Orthopedics;  Laterality: Right;  Right total shoulder arthroplasty  . TOTAL SHOULDER REPLACEMENT Right 06/03/2015  . TUBAL LIGATION      FAMILY HISTORY: Father with non-Hodgkin's lymphoma, 2 paternal aunts with breast  cancer.     ADVANCED DIRECTIVES:    HEALTH MAINTENANCE: Social History   Tobacco Use  . Smoking status: Never Smoker  . Smokeless tobacco: Never Used  Substance Use Topics  . Alcohol use: Yes    Alcohol/week: 0.0 standard drinks    Comment: occasionally wine  . Drug use: Yes    Types: Marijuana    Comment: cannabis with no extra  low dose edibles  for neuropathy     Colonoscopy:  PAP:  Bone density:  Lipid panel:  Allergies  Allergen Reactions  . Ace Inhibitors Cough  . Latex Itching  . Morphine And Related Itching    Caused her to itch terribly. Would prefer if given to take with a benadryl  . Penicillins Rash    Has patient had a PCN reaction causing immediate rash, facial/tongue/throat swelling, SOB or lightheadedness with hypotension: No Has patient had a PCN reaction causing severe rash involving mucus membranes or skin necrosis: No Has patient had a PCN reaction that required hospitalization No Has patient had a PCN reaction occurring within the last 10 years: No If all of the above answers are "NO", then may proceed with Cephalosporin use.    Current Outpatient Medications  Medication Sig Dispense Refill  . acetaminophen (TYLENOL) 500 MG tablet Take 500 mg by mouth every 6 (six) hours as needed for mild pain or moderate pain.     Marland Kitchen atorvastatin (LIPITOR) 10 MG tablet Take 10 mg by mouth at bedtime.     Marland Kitchen buPROPion (WELLBUTRIN XL) 150 MG 24 hr tablet Take 150 mg by mouth at bedtime.     . Calcium-Magnesium-Vitamin D (CALCIUM 1200+D3 PO) Take 1 tablet by mouth daily.     . Cyanocobalamin 5000 MCG CAPS Take 5,000 mcg by mouth daily.     Marland Kitchen dexamethasone (DECADRON) 4 MG tablet Take 1 tablet (4 mg total) by mouth daily. 30 tablet 1  . ferrous sulfate 325 (65 FE) MG EC tablet Take 325 mg by mouth daily.     . fexofenadine (ALLEGRA) 180 MG tablet Take 180 mg by mouth at bedtime.     . gabapentin (NEURONTIN) 300 MG capsule Take 300-600 mg by mouth See admin  instructions. Take 300 mg by mouth in the morning and 600 mg at night    . lidocaine-prilocaine (EMLA) cream Apply 1 application topically as needed. Apply to port 1-2 hours prior to chemotherapy appointment. Cover with plastic wrap. 30 g 0  . metFORMIN (GLUCOPHAGE) 850 MG tablet Take 850 mg by mouth 2 (two) times daily with a meal.     . mirtazapine (REMERON) 15 MG tablet Take 1 tablet (15 mg total) by mouth at bedtime. 30 tablet 1  . Oxycodone HCl 10 MG TABS Take 1 tablet (10 mg total) by mouth every 4 (four) hours as needed. Ok to take every 4-6 hours as needed. 90 tablet 0  . pantoprazole (PROTONIX) 40 MG tablet Take 40 mg by mouth at bedtime.     Marland Kitchen PARoxetine (PAXIL) 40 MG  tablet Take 40 mg by mouth at bedtime.     . prochlorperazine (COMPAZINE) 10 MG tablet Take 1 tablet (10 mg total) by mouth every 6 (six) hours as needed for nausea or vomiting. 30 tablet 3  . sodium chloride (MURO 128) 5 % ophthalmic solution Place 1 drop into both eyes 4 (four) times daily.    Marland Kitchen zolpidem (AMBIEN) 10 MG tablet Take 10 mg by mouth at bedtime as needed (sleep).      No current facility-administered medications for this visit.     OBJECTIVE: Vitals:   03/27/18 1059 03/27/18 1104  BP:  106/63  Pulse:  (!) 105  Resp: 16   Temp:  (!) 97.5 F (36.4 C)     Body mass index is 28.68 kg/m.    ECOG FS:2 - Symptomatic, <50% confined to bed  General: Ill-appearing, no acute distress.  Sitting in a wheelchair. Eyes: Pink conjunctiva, anicteric sclera. HEENT: Normocephalic, moist mucous membranes, clear oropharnyx. Lungs: Clear to auscultation bilaterally. Heart: Regular rate and rhythm. No rubs, murmurs, or gallops. Abdomen: Soft, nontender, nondistended. No organomegaly noted, normoactive bowel sounds. Musculoskeletal: No edema, cyanosis, or clubbing. Neuro: Alert, answering all questions appropriately. Cranial nerves grossly intact. Skin: No rashes or petechiae noted. Psych: Normal affect.  LAB  RESULTS:  Lab Results  Component Value Date   NA 137 03/27/2018   K 4.1 03/27/2018   CL 105 03/27/2018   CO2 24 03/27/2018   GLUCOSE 144 (H) 03/27/2018   BUN 26 (H) 03/27/2018   CREATININE 1.17 (H) 03/27/2018   CALCIUM 9.0 03/27/2018   PROT 6.2 (L) 03/27/2018   ALBUMIN 3.2 (L) 03/27/2018   AST 305 (H) 03/27/2018   ALT 131 (H) 03/27/2018   ALKPHOS 318 (H) 03/27/2018   BILITOT 0.8 03/27/2018   GFRNONAA 49 (L) 03/27/2018   GFRAA 56 (L) 03/27/2018    Lab Results  Component Value Date   WBC 1.8 (L) 03/27/2018   NEUTROABS 0.6 (L) 03/27/2018   HGB 8.3 (L) 03/27/2018   HCT 27.2 (L) 03/27/2018   MCV 99.3 03/27/2018   PLT 330 03/27/2018     STUDIES: Dg Chest 2 View  Result Date: 03/03/2018 CLINICAL DATA:  Three-day history of gradually worsening shortness of breath. Current history of metastatic inflammatory breast cancer for which the patient received her most recent chemotherapy on 02/27/2018. EXAM: CHEST - 2 VIEW COMPARISON:  None. FINDINGS: AP ERECT and LATERAL images were obtained. Cardiac silhouette upper normal in size for AP technique. Thoracic aorta minimally atherosclerotic, unchanged. Hilar and mediastinal contours otherwise unremarkable. RIGHT jugular Port-A-Cath tip at the cavoatrial junction, unchanged. Lungs clear. Bronchovascular markings normal. Pulmonary vascularity normal. No visible pleural effusions. No pneumothorax. Prior RIGHT shoulder hemiarthroplasty with anatomic alignment. Remote healed BILATERAL rib fractures. IMPRESSION: No acute cardiopulmonary disease. Electronically Signed   By: Evangeline Dakin M.D.   On: 03/03/2018 16:54   Ct Angio Chest Pe W Or Wo Contrast  Result Date: 03/03/2018 CLINICAL DATA:  68 year old female with shortness of breath. Metastatic breast cancer, recently started on New chemotherapy. EXAM: CT ANGIOGRAPHY CHEST WITH CONTRAST TECHNIQUE: Multidetector CT imaging of the chest was performed using the standard protocol during bolus  administration of intravenous contrast. Multiplanar CT image reconstructions and MIPs were obtained to evaluate the vascular anatomy. CONTRAST:  68m OMNIPAQUE IOHEXOL 350 MG/ML SOLN COMPARISON:  Chest radiographs earlier today. CT Abdomen and Pelvis 02/20/2018. Chest CTA 11/03/2017. FINDINGS: Cardiovascular: Adequate contrast bolus timing in the pulmonary arterial tree. No focal filling defect  identified in the pulmonary arteries to suggest acute pulmonary embolism. Calcified coronary artery atherosclerosis. Negative visible aorta aside from mild calcified plaque in the upper abdomen. Stable mild cardiomegaly. No pericardial effusion. Right chest porta cath. Mediastinum/Nodes: Negative, no lymphadenopathy. Lungs/Pleura: Small or trace layering left pleural effusion. Chronic scarring in the anterior left upper lobe, perhaps radiation related. Peribronchial ground-glass opacity elsewhere seen in October has virtually resolved. The trachea and major airways are patent. No pulmonary consolidation. No new abnormal pulmonary opacity. Upper Abdomen: Stable elevation of the right hemidiaphragm. Heterogeneous enhancement of the liver was better demonstrated on 02/20/2018. The visible spleen, pancreas, adrenal glands, left kidney remain normal. Small gastric hiatal hernia. Negative visible bowel. Musculoskeletal: Stable left chest wall since October. Widespread skeletal metastases. Superimposed right shoulder arthroplasty. No pathologic fracture identified. Review of the MIP images confirms the above findings. IMPRESSION: 1.  Negative for acute pulmonary embolus. 2. Trace layering left pleural effusion but no other acute findings in the chest. 3. Widespread skeletal and hepatic metastatic disease. Electronically Signed   By: Genevie Ann M.D.   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  PLAN:    1.  Progressive ER/PR positive, HER-2 negative  with metastatic disease in liver and bone: MRI results from February 22, 2018 reviewed independently with rapidly progressive innumerable metastatic lesions in patient's liver.  Patient wishes to continue with palliative treatment.  She has discontinued Ibrance and letrozole.  Given her neutropenia and decreased performance status, will delay dose reduced Halaven today.  Patient will instead receive IV fluids.  Continue Zometa approximately every 4 weeks.  Return to clinic in 1 week for further evaluation and discussion on whether or not to continue treatment.   2.  Osteopenia: Patient's bone mineral density on January 26, 2016 reported a T score of -0.9 which is considered normal. Continue calcium and vitamin D.  Zometa as above.   3.  Anemia: Patient's hemoglobin trended down slightly to 8.3.  Monitor.   4. Peripheral neuropathy: Chronic and unchanged.  Continue gabapentin as prescribed. Neuropathy managed by neurology.  5. Back pain: MRI results from May 04, 2017 did not reveal metastatic disease.  Continue symptomatic treatment.  6.  Left IJ clot: Ultrasound results reviewed independently.  Patient has been instructed to discontinue Eliquis. 7.  Neutropenia: Secondary to chemotherapy.  Delay treatment 1 week as above. 8.  Dental complaints: Patient has been instructed if she needs dental intervention, to proceed as scheduled per her dentist.  Any invasive procedures likely will need to be timed several days prior to day 1 of her next cycle. 9.  Memory complaints: MRI of the brain on December 21, 2017 confirmed widespread osseous metastatic disease, but no intracranial abnormality was noted. 10.  Elevated liver enzymes: AST and ALT slightly worse today.  Monitor. 11.  Poor appetite: Megace is not approved by insurance and dexamethasone caused insomnia.  Patient was given a prescription for Remeron today.  Patient expressed understanding and was in agreement with this plan. She also understands that  She can call clinic at any time with any questions, concerns, or complaints.   Breast cancer   Staging form: Breast, AJCC 7th Edition     Pathologic stage from 08/11/2014: Stage IIIA (T0, N2a, cM0) - Signed by Lloyd Huger, MD on 08/11/2014   Lloyd Huger, MD   03/29/2018 11:23 AM

## 2018-03-27 ENCOUNTER — Inpatient Hospital Stay (HOSPITAL_BASED_OUTPATIENT_CLINIC_OR_DEPARTMENT_OTHER): Payer: Medicare Other | Admitting: Oncology

## 2018-03-27 ENCOUNTER — Inpatient Hospital Stay (HOSPITAL_BASED_OUTPATIENT_CLINIC_OR_DEPARTMENT_OTHER): Payer: Medicare Other | Admitting: Hospice and Palliative Medicine

## 2018-03-27 ENCOUNTER — Other Ambulatory Visit: Payer: Self-pay

## 2018-03-27 ENCOUNTER — Inpatient Hospital Stay: Payer: Medicare Other

## 2018-03-27 ENCOUNTER — Encounter: Payer: Self-pay | Admitting: Oncology

## 2018-03-27 VITALS — BP 106/63 | HR 105 | Temp 97.5°F | Resp 16 | Ht 64.0 in | Wt 167.1 lb

## 2018-03-27 DIAGNOSIS — R42 Dizziness and giddiness: Secondary | ICD-10-CM | POA: Diagnosis not present

## 2018-03-27 DIAGNOSIS — I82C12 Acute embolism and thrombosis of left internal jugular vein: Secondary | ICD-10-CM | POA: Diagnosis not present

## 2018-03-27 DIAGNOSIS — D701 Agranulocytosis secondary to cancer chemotherapy: Secondary | ICD-10-CM

## 2018-03-27 DIAGNOSIS — C50919 Malignant neoplasm of unspecified site of unspecified female breast: Secondary | ICD-10-CM

## 2018-03-27 DIAGNOSIS — R52 Pain, unspecified: Secondary | ICD-10-CM

## 2018-03-27 DIAGNOSIS — M8580 Other specified disorders of bone density and structure, unspecified site: Secondary | ICD-10-CM

## 2018-03-27 DIAGNOSIS — Z515 Encounter for palliative care: Secondary | ICD-10-CM | POA: Diagnosis not present

## 2018-03-27 DIAGNOSIS — R531 Weakness: Secondary | ICD-10-CM | POA: Diagnosis not present

## 2018-03-27 DIAGNOSIS — G620 Drug-induced polyneuropathy: Secondary | ICD-10-CM | POA: Diagnosis not present

## 2018-03-27 DIAGNOSIS — R630 Anorexia: Secondary | ICD-10-CM

## 2018-03-27 DIAGNOSIS — C787 Secondary malignant neoplasm of liver and intrahepatic bile duct: Secondary | ICD-10-CM

## 2018-03-27 DIAGNOSIS — R634 Abnormal weight loss: Secondary | ICD-10-CM | POA: Diagnosis not present

## 2018-03-27 DIAGNOSIS — C7951 Secondary malignant neoplasm of bone: Secondary | ICD-10-CM

## 2018-03-27 DIAGNOSIS — R350 Frequency of micturition: Secondary | ICD-10-CM | POA: Diagnosis not present

## 2018-03-27 DIAGNOSIS — D649 Anemia, unspecified: Secondary | ICD-10-CM | POA: Diagnosis not present

## 2018-03-27 DIAGNOSIS — Z5111 Encounter for antineoplastic chemotherapy: Secondary | ICD-10-CM | POA: Diagnosis not present

## 2018-03-27 DIAGNOSIS — R5383 Other fatigue: Secondary | ICD-10-CM

## 2018-03-27 LAB — CBC WITH DIFFERENTIAL/PLATELET
Abs Immature Granulocytes: 0.08 10*3/uL — ABNORMAL HIGH (ref 0.00–0.07)
Basophils Absolute: 0 10*3/uL (ref 0.0–0.1)
Basophils Relative: 1 %
Eosinophils Absolute: 0 10*3/uL (ref 0.0–0.5)
Eosinophils Relative: 0 %
HEMATOCRIT: 27.2 % — AB (ref 36.0–46.0)
Hemoglobin: 8.3 g/dL — ABNORMAL LOW (ref 12.0–15.0)
Immature Granulocytes: 5 %
Lymphocytes Relative: 26 %
Lymphs Abs: 0.5 10*3/uL — ABNORMAL LOW (ref 0.7–4.0)
MCH: 30.3 pg (ref 26.0–34.0)
MCHC: 30.5 g/dL (ref 30.0–36.0)
MCV: 99.3 fL (ref 80.0–100.0)
MONOS PCT: 35 %
Monocytes Absolute: 0.6 10*3/uL (ref 0.1–1.0)
NEUTROS ABS: 0.6 10*3/uL — AB (ref 1.7–7.7)
Neutrophils Relative %: 33 %
Platelets: 330 10*3/uL (ref 150–400)
RBC: 2.74 MIL/uL — ABNORMAL LOW (ref 3.87–5.11)
RDW: 17 % — ABNORMAL HIGH (ref 11.5–15.5)
WBC: 1.8 10*3/uL — ABNORMAL LOW (ref 4.0–10.5)
nRBC: 5.7 % — ABNORMAL HIGH (ref 0.0–0.2)

## 2018-03-27 LAB — COMPREHENSIVE METABOLIC PANEL
ALK PHOS: 318 U/L — AB (ref 38–126)
ALT: 131 U/L — ABNORMAL HIGH (ref 0–44)
AST: 305 U/L — ABNORMAL HIGH (ref 15–41)
Albumin: 3.2 g/dL — ABNORMAL LOW (ref 3.5–5.0)
Anion gap: 8 (ref 5–15)
BUN: 26 mg/dL — ABNORMAL HIGH (ref 8–23)
CO2: 24 mmol/L (ref 22–32)
Calcium: 9 mg/dL (ref 8.9–10.3)
Chloride: 105 mmol/L (ref 98–111)
Creatinine, Ser: 1.17 mg/dL — ABNORMAL HIGH (ref 0.44–1.00)
GFR calc Af Amer: 56 mL/min — ABNORMAL LOW (ref >60)
GFR calc non Af Amer: 49 mL/min — ABNORMAL LOW (ref >60)
GLUCOSE: 144 mg/dL — AB (ref 70–99)
Potassium: 4.1 mmol/L (ref 3.5–5.1)
Sodium: 137 mmol/L (ref 135–145)
TOTAL PROTEIN: 6.2 g/dL — AB (ref 6.5–8.1)
Total Bilirubin: 0.8 mg/dL (ref 0.3–1.2)

## 2018-03-27 MED ORDER — MIRTAZAPINE 15 MG PO TABS: 15.0000 mg | ORAL_TABLET | Freq: Every day | ORAL | 1 refills | Status: DC

## 2018-03-27 MED ORDER — HEPARIN SOD (PORK) LOCK FLUSH 100 UNIT/ML IV SOLN
500.0000 [IU] | Freq: Once | INTRAVENOUS | Status: AC
  Administered 2018-03-27: 500 [IU] via INTRAVENOUS
  Filled 2018-03-27: qty 5

## 2018-03-27 MED ORDER — SODIUM CHLORIDE 0.9 % IV SOLN
Freq: Once | INTRAVENOUS | Status: AC
  Administered 2018-03-27: 12:00:00 via INTRAVENOUS
  Filled 2018-03-27: qty 250

## 2018-03-27 MED ORDER — SODIUM CHLORIDE 0.9% FLUSH
10.0000 mL | Freq: Once | INTRAVENOUS | Status: AC
  Administered 2018-03-27: 10 mL via INTRAVENOUS
  Filled 2018-03-27: qty 10

## 2018-03-27 NOTE — Progress Notes (Signed)
Patient here treatment. Patient reports feeling very tired. Appetite is poor.

## 2018-03-27 NOTE — Progress Notes (Signed)
[facility]  [phone] [phone]   Name: Amanda [name] Date: 03/27/2018 MRN: [id number]  DOB: 1950/05/18  Patient Care Team: Sofie Hartigan, MD as PCP - General (Family Medicine) Dahlia Byes, Marjory Lies, MD as Consulting Physician (General Surgery)    REASON FOR CONSULTATION: Palliative Care consult requested for this 68 y.o. 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  She was found to have disease progression on Ibrance and letrozole.  Patient previously had verbalized a desire not to pursue further treatment in the event of disease progression.  However, she is decided to start [facility].  Palliative care was consulted to help address goals and follow for support.  SOCIAL HISTORY:    Patient lives at home with her husband.  She has a son and stepson who are involved.  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  Her husband owns a "ozone" business.  ADVANCE DIRECTIVES:  Not on file. Has HCPOA and living will at home.   CODE STATUS: DNR  PAST MEDICAL HISTORY: Past Medical History:  Diagnosis Date  . Anxiety   . Back pain    occasionally  . Breast cancer ([place]) 2009   left  . Depression    takes Paxil and Wellbutrin daily  . Diabetes ([place])    takes Metformin daily   type 2   . Genetic testing 02/03/2017   Multi-Cancer panel (83 genes) @ [facility] - No pathogenic mutations detected  . GERD (gastroesophageal reflux disease)    occasional  . History of bronchitis 2 yrs ago  . History of kidney stones   . Hyperlipidemia    takes Atorvastatin daily  . Hypertension    no meds  . Insomnia    takes Ambien nightly  . Insomnia    takes gabapentin nightly  . Joint pain   . Mood swings   . Osteoarthritis of knee   . Personal history of chemotherapy 12/05/2016   Mets from Breast Cancer  . Personal history of radiation therapy 11/2016  .  Pneumonia   . PONV (postoperative nausea and vomiting)   . Seasonal allergies    takes Allegra daily    PAST SURGICAL HISTORY:  Past Surgical History:  Procedure Laterality Date  . BREAST BIOPSY  2009  . cataract surgery Bilateral   . COLONOSCOPY    . IR RADIOLOGIST EVAL & MGMT  01/29/2018  . JOINT REPLACEMENT Left 2014   knee  . KNEE ARTHROSCOPY Left    x 5  . MASTECTOMY Left   . port a cath placed    . PORTACATH PLACEMENT N/A 09/17/2015   Procedure: INSERTION PORT-A-CATH;  Surgeon: Jules Husbands, MD;  Location: [facility]S;  Service: General;  Laterality: N/A;  . RADIOLOGY WITH ANESTHESIA N/A 02/20/2018   Procedure: CT WITH ANESTHESIA MICROWAVE THERMAL ABLATION-LIVER;  Surgeon: Jacqulynn Cadet, MD;  Location: [facility];  Service: Anesthesiology;  Laterality: N/A;  . TOOTH EXTRACTION    . TOTAL SHOULDER ARTHROPLASTY Right 06/03/2015   Procedure: TOTAL SHOULDER ARTHROPLASTY;  Surgeon: Tania Ade, MD;  Location: [place];  Service: Orthopedics;  Laterality: Right;  Right total shoulder arthroplasty  . TOTAL SHOULDER REPLACEMENT Right 06/03/2015  . TUBAL LIGATION      HEMATOLOGY/ONCOLOGY HISTORY:    Cancer of left female breast  ([place])   07/27/2014 Initial Diagnosis    Cancer of left female breast ([place])  Metastatic breast cancer ([place])   09/24/2015 Initial Diagnosis    Metastatic breast cancer ([place])    02/27/2018 -  Chemotherapy    The patient had eriBULin mesylate (HALAVEN) 2.75 mg in sodium chloride 0.9 % 100 mL chemo infusion, 1.4 mg/m2 = 2.75 mg, Intravenous,  Once, 1 of 4 cycles Dose modification: 1.1 mg/m2 (original dose 1.4 mg/m2, Cycle 1, Reason: Dose not tolerated) Administration: 2.75 mg (02/27/2018), 2.15 mg (03/20/2018)  for chemotherapy treatment.      ALLERGIES:  is allergic to ace inhibitors; latex; morphine and related; and penicillins.  MEDICATIONS:  Current Outpatient Medications  Medication Sig Dispense Refill  . acetaminophen (TYLENOL) 500 MG tablet Take 500  mg by mouth every 6 (six) hours as needed for mild pain or moderate pain.     Marland Kitchen atorvastatin (LIPITOR) 10 MG tablet Take 10 mg by mouth at bedtime.     Marland Kitchen buPROPion (WELLBUTRIN XL) 150 MG 24 hr tablet Take 150 mg by mouth at bedtime.     . Calcium-Magnesium-Vitamin D (CALCIUM 1200+D3 PO) Take 1 tablet by mouth daily.     . Cyanocobalamin 5000 MCG CAPS Take 5,000 mcg by mouth daily.     Marland Kitchen dexamethasone (DECADRON) 4 MG tablet Take 1 tablet (4 mg total) by mouth daily. 30 tablet 1  . ferrous sulfate 325 (65 FE) MG EC tablet Take 325 mg by mouth daily.     . fexofenadine (ALLEGRA) 180 MG tablet Take 180 mg by mouth at bedtime.     . gabapentin (NEURONTIN) 300 MG capsule Take 300-600 mg by mouth See admin instructions. Take 300 mg by mouth in the morning and 600 mg at night    . lidocaine-prilocaine (EMLA) cream Apply 1 application topically as needed. Apply to port 1-2 hours prior to chemotherapy appointment. Cover with plastic wrap. 30 g 0  . metFORMIN (GLUCOPHAGE) 850 MG tablet Take 850 mg by mouth 2 (two) times daily with a meal.     . Oxycodone HCl 10 MG TABS Take 1 tablet (10 mg total) by mouth every 4 (four) hours as needed. Ok to take every 4-6 hours as needed. 90 tablet 0  . pantoprazole (PROTONIX) 40 MG tablet Take 40 mg by mouth at bedtime.     Marland Kitchen PARoxetine (PAXIL) 40 MG tablet Take 40 mg by mouth at bedtime.     . prochlorperazine (COMPAZINE) 10 MG tablet Take 1 tablet (10 mg total) by mouth every 6 (six) hours as needed for nausea or vomiting. 30 tablet 3  . sodium chloride (MURO 128) 5 % ophthalmic solution Place 1 drop into both eyes 4 (four) times daily.    Marland Kitchen zolpidem (AMBIEN) 10 MG tablet Take 10 mg by mouth at bedtime as needed (sleep).      No current facility-administered medications for this visit.    Facility-Administered Medications Ordered in Other Visits  Medication Dose Route Frequency Provider Last Rate Last Dose  . heparin lock flush 100 unit/mL  500 Units Intravenous Once  Lloyd Huger, MD        VITAL SIGNS: There were no vitals taken for this visit. There were no vitals filed for this visit.  Estimated body mass index is 28.68 kg/m as calculated from the following:   Height as of an earlier encounter on 03/27/18: _0  (1.626 m).   Weight as of an earlier encounter on 03/27/18: 167 lb 1.6 oz (75.8 kg).  LABS: CBC:    Component Value Date/Time   WBC  1.8 (L) 03/27/2018 1043   HGB 8.3 (L) 03/27/2018 1043   HGB 13.2 12/20/2011 0927   HCT 27.2 (L) 03/27/2018 1043   HCT 40.6 12/20/2011 0927   PLT 330 03/27/2018 1043   PLT 235 12/20/2011 0927   MCV 99.3 03/27/2018 1043   MCV 96 12/20/2011 0927   NEUTROABS 0.6 (L) 03/27/2018 1043   NEUTROABS 1.7 12/20/2011 0927   LYMPHSABS 0.5 (L) 03/27/2018 1043   LYMPHSABS 1.4 12/20/2011 0927   MONOABS 0.6 03/27/2018 1043   MONOABS 0.4 12/20/2011 0927   EOSABS 0.0 03/27/2018 1043   EOSABS 0.4 12/20/2011 0927   BASOSABS 0.0 03/27/2018 1043   BASOSABS 0.0 12/20/2011 0927   Comprehensive Metabolic Panel:    Component Value Date/Time   NA 137 03/27/2018 1043   NA 139 12/20/2011 0927   K 4.1 03/27/2018 1043   K 4.1 12/20/2011 0927   CL 105 03/27/2018 1043   CL 102 12/20/2011 0927   CO2 24 03/27/2018 1043   CO2 26 12/20/2011 0927   BUN 26 (H) 03/27/2018 1043   BUN 19 (H) 12/20/2011 0927   CREATININE 1.17 (H) 03/27/2018 1043   CREATININE 1.15 12/20/2011 0927   GLUCOSE 144 (H) 03/27/2018 1043   GLUCOSE 251 (H) 12/20/2011 0927   CALCIUM 9.0 03/27/2018 1043   CALCIUM 8.6 12/20/2011 0927   AST 305 (H) 03/27/2018 1043   AST 49 (H) 12/20/2011 0927   ALT 131 (H) 03/27/2018 1043   ALT 57 12/20/2011 0927   ALKPHOS 318 (H) 03/27/2018 1043   ALKPHOS 61 12/20/2011 0927   BILITOT 0.8 03/27/2018 1043   BILITOT 0.4 12/20/2011 0927   PROT 6.2 (L) 03/27/2018 1043   PROT 6.8 12/20/2011 0927   ALBUMIN 3.2 (L) 03/27/2018 1043   ALBUMIN 3.6 12/20/2011 0927    RADIOGRAPHIC STUDIES: Dg Chest 2 View  Result  Date: 03/03/2018 CLINICAL DATA:  Three-day history of gradually worsening shortness of breath. Current history of metastatic inflammatory breast cancer for which the patient received her most recent chemotherapy on 02/27/2018. EXAM: CHEST - 2 VIEW COMPARISON:  None. FINDINGS: AP ERECT and LATERAL images were obtained. Cardiac silhouette upper normal in size for AP technique. Thoracic aorta minimally atherosclerotic, unchanged. Hilar and mediastinal contours otherwise unremarkable. RIGHT jugular Port-A-Cath tip at the cavoatrial junction, unchanged. Lungs clear. Bronchovascular markings normal. Pulmonary vascularity normal. No visible pleural effusions. No pneumothorax. Prior RIGHT shoulder hemiarthroplasty with anatomic alignment. Remote healed BILATERAL rib fractures. IMPRESSION: No acute cardiopulmonary disease. Electronically Signed   By: Evangeline Dakin M.D.   On: 03/03/2018 16:54   Ct Angio Chest Pe W Or Wo Contrast  Result Date: 03/03/2018 CLINICAL DATA:  68 year old female with shortness of breath. Metastatic breast cancer, recently started on New chemotherapy. EXAM: CT ANGIOGRAPHY CHEST WITH CONTRAST TECHNIQUE: Multidetector CT imaging of the chest was performed using the standard protocol during bolus administration of intravenous contrast. Multiplanar CT image reconstructions and MIPs were obtained to evaluate the vascular anatomy. CONTRAST:  66m OMNIPAQUE IOHEXOL 350 MG/ML SOLN COMPARISON:  Chest radiographs earlier today. CT Abdomen and Pelvis 02/20/2018. Chest CTA 11/03/2017. FINDINGS: Cardiovascular: Adequate contrast bolus timing in the pulmonary arterial tree. No focal filling defect identified in the pulmonary arteries to suggest acute pulmonary embolism. Calcified coronary artery atherosclerosis. Negative visible aorta aside from mild calcified plaque in the upper abdomen. Stable mild cardiomegaly. No pericardial effusion. Right chest porta cath. Mediastinum/Nodes: Negative, no  lymphadenopathy. Lungs/Pleura: Small or trace layering left pleural effusion. Chronic scarring in the anterior left  upper lobe, perhaps radiation related. Peribronchial ground-glass opacity elsewhere seen in October has virtually resolved. The trachea and major airways are patent. No pulmonary consolidation. No new abnormal pulmonary opacity. Upper Abdomen: Stable elevation of the right hemidiaphragm. Heterogeneous enhancement of the liver was better demonstrated on 02/20/2018. The visible spleen, pancreas, adrenal glands, left kidney remain normal. Small gastric hiatal hernia. Negative visible bowel. Musculoskeletal: Stable left chest wall since October. Widespread skeletal metastases. Superimposed right shoulder arthroplasty. No pathologic fracture identified. Review of the MIP images confirms the above findings. IMPRESSION: 1.  Negative for acute pulmonary embolus. 2. Trace layering left pleural effusion but no other acute findings in the chest. 3. Widespread skeletal and hepatic metastatic disease. Electronically Signed   By: Genevie Ann M.D.   On: 03/03/2018 21:44    PERFORMANCE STATUS (ECOG) : 1 - Symptomatic but completely ambulatory  Review of Systems As noted above. Otherwise, a complete review of systems is negative.  Physical Exam General: NAD, frail appearing Pulmonary: unlabored Extremities: no edema Skin: no rashes Neurological: Weakness but otherwise nonfocal  IMPRESSION: I met with patient and her husband today in the clinic. She was seen while patient was receiving IV fluids.   Patient says her appetite has remained poor. She plans to try protein powder and will continue to take oral supplements as tolerated. Weight down significantly to 167lbs from 174lbs two weeks ago. Again discussed trying to maximize calories in consumed food. Patient tried dexamethasone but did not tolerate due to insomnia. She has been rotated to mirtazapine. Would recommend half dose (7.63m) as smaller doses of  mirtazapine have been associated with increased weight gain.   Pain is well controlled on current regimen. She takes CBD oil at home with good effect.    PLAN: -Continue current scope of treatment -Continue oxycodone prn for pain -Mirtazapine started by Dr. FGrayland Ormondfor appetite. Consider dose reduction to 7.545m-RTC in 3 weeks   Patient expressed understanding and was in agreement with this plan. She also understands that She can call clinic at any time with any questions, concerns, or complaints.    Time Total: 20 minutes  Visit consisted of counseling and education dealing with the complex and emotionally intense issues of symptom management and palliative care in the setting of serious and potentially life-threatening illness.Greater than 50%  of this time was spent counseling and coordinating care related to the above assessment and plan.  Signed by: JoAltha HarmPhD, NP-C [phone] Cell)

## 2018-04-03 NOTE — Progress Notes (Signed)
[place]  [phone] Fax:(336) [phone]  ID: Amanda [name] OB: [birth year]  MR#: [id number]  UXY#:[id number]  Patient Care Team: Sofie Hartigan, MD as PCP - General (Family Medicine) Jules Husbands, MD as Consulting Physician (General Surgery)  CHIEF COMPLAINT: Progressive ER/PR positive, 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  INTERVAL HISTORY: Patient returns to clinic today for further evaluation and consideration of her next infusion of Halaven.  She continues to have a decreased performance status and weakness and fatigue.  She has a mildly improved appetite. She has a chronic peripheral neuropathy, but no other neurologic complaints.  She denies any fevers.  She denies any chest pain, cough, or hemoptysis. She denies any nausea, vomiting, constipation, or diarrhea. She has no urinary complaints.  Patient offers no further specific complaints today.  REVIEW OF SYSTEMS:   Review of Systems  Constitutional: Positive for malaise/fatigue and weight loss. Negative for fever.  Eyes: Negative.  Negative for blurred vision, double vision and pain.  Respiratory: Negative.  Negative for cough and shortness of breath.   Cardiovascular: Negative.  Negative for chest pain and leg swelling.  Gastrointestinal: Negative.  Negative for abdominal pain.  Genitourinary: Negative.  Negative for dysuria and flank pain.  Musculoskeletal: Negative for back pain and falls.  Skin: Negative.  Negative for rash.  Neurological: Positive for tingling, sensory change and weakness. Negative for focal weakness and headaches.  Endo/Heme/Allergies: Negative.   Psychiatric/Behavioral: Negative.  Negative for memory loss. The patient is not nervous/anxious.     As per HPI. Otherwise, a complete review of systems is negative.  PAST MEDICAL HISTORY: Past Medical History:  Diagnosis Date  . Anxiety   . Back pain    occasionally  . Breast cancer ([place]) 2009    left  . Depression    takes Paxil and Wellbutrin daily  . Diabetes ([place])    takes Metformin daily   type 2   . Genetic testing 02/03/2017   Multi-Cancer panel (83 genes) @ [facility] - No pathogenic mutations detected  . GERD (gastroesophageal reflux disease)    occasional  . History of bronchitis 2 yrs ago  . History of kidney stones   . Hyperlipidemia    takes Atorvastatin daily  . Hypertension    no meds  . Insomnia    takes Ambien nightly  . Insomnia    takes gabapentin nightly  . Joint pain   . Mood swings   . Osteoarthritis of knee   . Personal history of chemotherapy 12/05/2016   Mets from Breast Cancer  . Personal history of radiation therapy 11/2016  . Pneumonia   . PONV (postoperative nausea and vomiting)   . Seasonal allergies    takes Allegra daily    PAST SURGICAL HISTORY: Past Surgical History:  Procedure Laterality Date  . BREAST BIOPSY  2009  . cataract surgery Bilateral   . COLONOSCOPY    . IR RADIOLOGIST EVAL & MGMT  01/29/2018  . JOINT REPLACEMENT Left 2014   knee  . KNEE ARTHROSCOPY Left    x 5  . MASTECTOMY Left   . port a cath placed    . PORTACATH PLACEMENT N/A 09/17/2015   Procedure: INSERTION PORT-A-CATH;  Surgeon: Jules Husbands, MD;  Location: [facility];  Service: General;  Laterality: N/A;  . RADIOLOGY WITH ANESTHESIA N/A 02/20/2018   Procedure: CT WITH ANESTHESIA MICROWAVE THERMAL ABLATION-LIVER;  Surgeon: Jacqulynn Cadet, MD;  Location: [facility];  Service: Anesthesiology;  Laterality: N/A;  . TOOTH EXTRACTION    . TOTAL SHOULDER ARTHROPLASTY Right 06/03/2015   Procedure: TOTAL SHOULDER ARTHROPLASTY;  Surgeon: Tania Ade, MD;  Location: [place];  Service: Orthopedics;  Laterality: Right;  Right total shoulder arthroplasty  . TOTAL SHOULDER REPLACEMENT Right 06/03/2015  . TUBAL LIGATION      FAMILY HISTORY: Father with non-Hodgkin's lymphoma, 2 paternal aunts with breast cancer.     ADVANCED DIRECTIVES:    HEALTH MAINTENANCE: Social  History   Tobacco Use  . Smoking status: Never Smoker  . Smokeless tobacco: Never Used  Substance Use Topics  . Alcohol use: Yes    Alcohol/week: 0.0 standard drinks    Comment: occasionally wine  . Drug use: Yes    Types: Marijuana    Comment: cannabis with no extra  low dose edibles  for neuropathy     Colonoscopy:  PAP:  Bone density:  Lipid panel:  Allergies  Allergen Reactions  . Ace Inhibitors Cough  . Latex Itching  . Morphine And Related Itching    Caused her to itch terribly. Would prefer if given to take with a benadryl  . Penicillins Rash    Has patient had a PCN reaction causing immediate rash, facial/tongue/throat swelling, SOB or lightheadedness with hypotension: No Has patient had a PCN reaction causing severe rash involving mucus membranes or skin necrosis: No Has patient had a PCN reaction that required hospitalization No Has patient had a PCN reaction occurring within the last 10 years: No If all of the above answers are "NO", then may proceed with Cephalosporin use.    Current Outpatient Medications  Medication Sig Dispense Refill  . acetaminophen (TYLENOL) 500 MG tablet Take 500 mg by mouth every 6 (six) hours as needed for mild pain or moderate pain.     Marland Kitchen atorvastatin (LIPITOR) 10 MG tablet Take 10 mg by mouth at bedtime.     Marland Kitchen buPROPion (WELLBUTRIN XL) 150 MG 24 hr tablet Take 150 mg by mouth at bedtime.     . Calcium-Magnesium-Vitamin D (CALCIUM 1200+D3 PO) Take 1 tablet by mouth daily.     . Cyanocobalamin 5000 MCG CAPS Take 5,000 mcg by mouth daily.     . ferrous sulfate 325 (65 FE) MG EC tablet Take 325 mg by mouth daily.     . fexofenadine (ALLEGRA) 180 MG tablet Take 180 mg by mouth at bedtime.     . gabapentin (NEURONTIN) 300 MG capsule Take 300-600 mg by mouth See admin instructions. Take 300 mg by mouth in the morning and 600 mg at night    . lidocaine-prilocaine (EMLA) cream Apply 1 application topically as needed. Apply to port 1-2 hours  prior to chemotherapy appointment. Cover with plastic wrap. 30 g 0  . metFORMIN (GLUCOPHAGE) 850 MG tablet Take 850 mg by mouth 2 (two) times daily with a meal.     . Oxycodone HCl 10 MG TABS Take 1 tablet (10 mg total) by mouth every 4 (four) hours as needed. Ok to take every 4-6 hours as needed. 90 tablet 0  . pantoprazole (PROTONIX) 40 MG tablet Take 40 mg by mouth at bedtime.     Marland Kitchen PARoxetine (PAXIL) 40 MG tablet Take 40 mg by mouth at bedtime.     . prochlorperazine (COMPAZINE) 10 MG tablet Take 1 tablet (10 mg total) by mouth every 6 (six) hours as needed for nausea or vomiting. 30 tablet 3  . sodium chloride (MURO 128) 5 % ophthalmic solution Place  1 drop into both eyes 4 (four) times daily.    Marland Kitchen zolpidem (AMBIEN) 10 MG tablet Take 10 mg by mouth at bedtime as needed (sleep).      No current facility-administered medications for this visit.     OBJECTIVE: Vitals:   04/04/18 0917  BP: 131/80  Pulse: (!) 104  Resp: 18  Temp: 98.4 F (36.9 C)     Body mass index is 29.3 kg/m.    ECOG FS:2 - Symptomatic, <50% confined to bed  General: Ill-appearing, no acute distress.  Sitting in a wheelchair. Eyes: Pink conjunctiva, anicteric sclera. HEENT: Normocephalic, moist mucous membranes, clear oropharnyx. Lungs: Clear to auscultation bilaterally. Heart: Regular rate and rhythm. No rubs, murmurs, or gallops. Abdomen: Soft, nontender, nondistended. No organomegaly noted, normoactive bowel sounds. Musculoskeletal: No edema, cyanosis, or clubbing. Neuro: Alert, answering all questions appropriately. Cranial nerves grossly intact. Skin: No rashes or petechiae noted. Psych: Normal affect.  LAB RESULTS:  Lab Results  Component Value Date   NA 136 04/04/2018   K 4.4 04/04/2018   CL 106 04/04/2018   CO2 21 (L) 04/04/2018   GLUCOSE 148 (H) 04/04/2018   BUN 25 (H) 04/04/2018   CREATININE 1.10 (H) 04/04/2018   CALCIUM 8.5 (L) 04/04/2018   PROT 5.7 (L) 04/04/2018   ALBUMIN 2.9 (L)  04/04/2018   AST 137 (H) 04/04/2018   ALT 64 (H) 04/04/2018   ALKPHOS 248 (H) 04/04/2018   BILITOT 0.6 04/04/2018   GFRNONAA 52 (L) 04/04/2018   GFRAA >60 04/04/2018    Lab Results  Component Value Date   WBC 6.1 04/04/2018   NEUTROABS 4.2 04/04/2018   HGB 7.2 (L) 04/04/2018   HCT 24.1 (L) 04/04/2018   MCV 100.0 04/04/2018   PLT 264 04/04/2018     STUDIES: No results found.  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  PLAN:    1.  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  Patient wishes to continue with palliative treatment.  She has discontinued Ibrance and letrozole.  Patient's blood counts have improved, therefore will proceed with dose reduced Halaven today.  She likely can only receive treatment every other week at best.  Patient will also receive Zometa today.  Return to clinic in 1 week for further evaluation and possible IV fluids. 2.  Anemia: Patient's hemoglobin is trending down is now 7.2.  Given blood shortage, will hold transfusion until she falls below 7.0. 3.  Peripheral neuropathy: Chronic and unchanged.  Continue gabapentin as prescribed. Neuropathy managed by neurology.  4. Back pain: MRI results from May 04, 2017 did not reveal metastatic disease.  Continue symptomatic treatment.  5.  Left IJ clot: Ultrasound results reviewed independently.  Patient has been instructed to discontinue Eliquis. 6.  Neutropenia: Improved.  Proceed with dose reduced Halaven as above. 7.  Dental complaints: Patient has been instructed if she needs dental intervention, to proceed as scheduled per her dentist.  Any invasive procedures likely will need to be timed several days prior to day 1 of her next cycle. 8.  Memory complaints: MRI of the brain on  December 21, 2017 confirmed widespread osseous metastatic disease, but no intracranial abnormality was noted. 9.  Elevated liver enzymes: AST and ALT remain elevated, but significantly improved.  Monitor. 10.  Poor appetite: Slightly improved.  Patient states  Remeron keeps her awake and has discontinued treatment.  Patient expressed understanding and was in agreement with this plan. She also understands that She can call clinic at any time with any questions, concerns, or complaints.   Breast cancer   Staging form: Breast, AJCC 7th Edition     Pathologic stage from 08/11/2014: Stage IIIA (T0, N2a, cM0) - Signed by Lloyd Huger, MD on 08/11/2014   Lloyd Huger, MD   04/04/2018 12:05 PM

## 2018-04-04 ENCOUNTER — Encounter: Payer: Self-pay | Admitting: Oncology

## 2018-04-04 ENCOUNTER — Inpatient Hospital Stay: Payer: Medicare Other

## 2018-04-04 ENCOUNTER — Inpatient Hospital Stay (HOSPITAL_BASED_OUTPATIENT_CLINIC_OR_DEPARTMENT_OTHER): Payer: Medicare Other | Admitting: Oncology

## 2018-04-04 ENCOUNTER — Other Ambulatory Visit: Payer: Self-pay

## 2018-04-04 VITALS — BP 131/80 | HR 104 | Temp 98.4°F | Resp 18 | Wt 170.7 lb

## 2018-04-04 VITALS — HR 97

## 2018-04-04 DIAGNOSIS — D709 Neutropenia, unspecified: Secondary | ICD-10-CM

## 2018-04-04 DIAGNOSIS — D649 Anemia, unspecified: Secondary | ICD-10-CM

## 2018-04-04 DIAGNOSIS — C787 Secondary malignant neoplasm of liver and intrahepatic bile duct: Secondary | ICD-10-CM

## 2018-04-04 DIAGNOSIS — C7951 Secondary malignant neoplasm of bone: Secondary | ICD-10-CM | POA: Diagnosis not present

## 2018-04-04 DIAGNOSIS — R5381 Other malaise: Secondary | ICD-10-CM

## 2018-04-04 DIAGNOSIS — Z170 Estrogen receptor positive status [ER+]: Secondary | ICD-10-CM

## 2018-04-04 DIAGNOSIS — C50919 Malignant neoplasm of unspecified site of unspecified female breast: Secondary | ICD-10-CM

## 2018-04-04 DIAGNOSIS — G620 Drug-induced polyneuropathy: Secondary | ICD-10-CM | POA: Diagnosis not present

## 2018-04-04 DIAGNOSIS — R531 Weakness: Secondary | ICD-10-CM | POA: Diagnosis not present

## 2018-04-04 DIAGNOSIS — R42 Dizziness and giddiness: Secondary | ICD-10-CM | POA: Diagnosis not present

## 2018-04-04 DIAGNOSIS — C50912 Malignant neoplasm of unspecified site of left female breast: Secondary | ICD-10-CM

## 2018-04-04 DIAGNOSIS — R945 Abnormal results of liver function studies: Secondary | ICD-10-CM

## 2018-04-04 DIAGNOSIS — I82C12 Acute embolism and thrombosis of left internal jugular vein: Secondary | ICD-10-CM | POA: Diagnosis not present

## 2018-04-04 DIAGNOSIS — R350 Frequency of micturition: Secondary | ICD-10-CM | POA: Diagnosis not present

## 2018-04-04 DIAGNOSIS — Z5111 Encounter for antineoplastic chemotherapy: Secondary | ICD-10-CM | POA: Diagnosis not present

## 2018-04-04 LAB — COMPREHENSIVE METABOLIC PANEL
ALT: 64 U/L — AB (ref 0–44)
AST: 137 U/L — ABNORMAL HIGH (ref 15–41)
Albumin: 2.9 g/dL — ABNORMAL LOW (ref 3.5–5.0)
Alkaline Phosphatase: 248 U/L — ABNORMAL HIGH (ref 38–126)
Anion gap: 9 (ref 5–15)
BUN: 25 mg/dL — ABNORMAL HIGH (ref 8–23)
CO2: 21 mmol/L — ABNORMAL LOW (ref 22–32)
CREATININE: 1.1 mg/dL — AB (ref 0.44–1.00)
Calcium: 8.5 mg/dL — ABNORMAL LOW (ref 8.9–10.3)
Chloride: 106 mmol/L (ref 98–111)
GFR calc non Af Amer: 52 mL/min — ABNORMAL LOW (ref >60)
Glucose, Bld: 148 mg/dL — ABNORMAL HIGH (ref 70–99)
Potassium: 4.4 mmol/L (ref 3.5–5.1)
Sodium: 136 mmol/L (ref 135–145)
Total Bilirubin: 0.6 mg/dL (ref 0.3–1.2)
Total Protein: 5.7 g/dL — ABNORMAL LOW (ref 6.5–8.1)

## 2018-04-04 LAB — CBC WITH DIFFERENTIAL/PLATELET
Abs Immature Granulocytes: 0.18 10*3/uL — ABNORMAL HIGH (ref 0.00–0.07)
Basophils Absolute: 0 10*3/uL (ref 0.0–0.1)
Basophils Relative: 1 %
EOS PCT: 0 %
Eosinophils Absolute: 0 10*3/uL (ref 0.0–0.5)
HCT: 24.1 % — ABNORMAL LOW (ref 36.0–46.0)
Hemoglobin: 7.2 g/dL — ABNORMAL LOW (ref 12.0–15.0)
Immature Granulocytes: 3 %
Lymphocytes Relative: 9 %
Lymphs Abs: 0.5 10*3/uL — ABNORMAL LOW (ref 0.7–4.0)
MCH: 29.9 pg (ref 26.0–34.0)
MCHC: 29.9 g/dL — AB (ref 30.0–36.0)
MCV: 100 fL (ref 80.0–100.0)
Monocytes Absolute: 1.2 10*3/uL — ABNORMAL HIGH (ref 0.1–1.0)
Monocytes Relative: 20 %
NRBC: 0.8 % — AB (ref 0.0–0.2)
Neutro Abs: 4.2 10*3/uL (ref 1.7–7.7)
Neutrophils Relative %: 67 %
Platelets: 264 10*3/uL (ref 150–400)
RBC: 2.41 MIL/uL — ABNORMAL LOW (ref 3.87–5.11)
RDW: 17.4 % — ABNORMAL HIGH (ref 11.5–15.5)
WBC: 6.1 10*3/uL (ref 4.0–10.5)

## 2018-04-04 MED ORDER — SODIUM CHLORIDE 0.9 % IV SOLN
Freq: Once | INTRAVENOUS | Status: AC
  Administered 2018-04-04: 11:00:00 via INTRAVENOUS
  Filled 2018-04-04: qty 250

## 2018-04-04 MED ORDER — ZOLEDRONIC ACID 4 MG/100ML IV SOLN
4.0000 mg | Freq: Once | INTRAVENOUS | Status: AC
  Administered 2018-04-04: 4 mg via INTRAVENOUS
  Filled 2018-04-04: qty 100

## 2018-04-04 MED ORDER — SODIUM CHLORIDE 0.9 % IV SOLN
1.1000 mg/m2 | Freq: Once | INTRAVENOUS | Status: AC
  Administered 2018-04-04: 2.15 mg via INTRAVENOUS
  Filled 2018-04-04: qty 4.3

## 2018-04-04 MED ORDER — HEPARIN SOD (PORK) LOCK FLUSH 100 UNIT/ML IV SOLN
500.0000 [IU] | Freq: Once | INTRAVENOUS | Status: AC | PRN
  Administered 2018-04-04: 500 [IU]
  Filled 2018-04-04: qty 5

## 2018-04-04 MED ORDER — PROCHLORPERAZINE MALEATE 10 MG PO TABS
10.0000 mg | ORAL_TABLET | Freq: Once | ORAL | Status: AC
  Administered 2018-04-04: 10 mg via ORAL

## 2018-04-04 MED ORDER — SODIUM CHLORIDE 0.9% FLUSH
10.0000 mL | Freq: Once | INTRAVENOUS | Status: AC
  Administered 2018-04-04: 10 mL via INTRAVENOUS
  Filled 2018-04-04: qty 10

## 2018-04-04 NOTE — Progress Notes (Signed)
Patient reports weakness and back pain.

## 2018-04-09 ENCOUNTER — Other Ambulatory Visit: Payer: Self-pay

## 2018-04-10 ENCOUNTER — Ambulatory Visit
Admission: RE | Admit: 2018-04-10 | Discharge: 2018-04-10 | Disposition: A | Discharge status: Home / Self Care | Payer: Medicare Other | Source: Ambulatory Visit | Attending: Radiation Oncology | Admitting: Radiation Oncology

## 2018-04-10 ENCOUNTER — Inpatient Hospital Stay: Payer: Medicare Other

## 2018-04-10 ENCOUNTER — Inpatient Hospital Stay (HOSPITAL_BASED_OUTPATIENT_CLINIC_OR_DEPARTMENT_OTHER): Payer: Medicare Other | Admitting: Oncology

## 2018-04-10 ENCOUNTER — Other Ambulatory Visit: Payer: Self-pay

## 2018-04-10 VITALS — BP 122/80 | HR 106 | Temp 95.8°F | Wt 167.8 lb

## 2018-04-10 DIAGNOSIS — C7951 Secondary malignant neoplasm of bone: Secondary | ICD-10-CM

## 2018-04-10 DIAGNOSIS — D649 Anemia, unspecified: Secondary | ICD-10-CM | POA: Diagnosis not present

## 2018-04-10 DIAGNOSIS — C50919 Malignant neoplasm of unspecified site of unspecified female breast: Secondary | ICD-10-CM

## 2018-04-10 DIAGNOSIS — C787 Secondary malignant neoplasm of liver and intrahepatic bile duct: Secondary | ICD-10-CM | POA: Diagnosis not present

## 2018-04-10 DIAGNOSIS — C50912 Malignant neoplasm of unspecified site of left female breast: Secondary | ICD-10-CM | POA: Diagnosis not present

## 2018-04-10 DIAGNOSIS — R531 Weakness: Secondary | ICD-10-CM

## 2018-04-10 DIAGNOSIS — I82C12 Acute embolism and thrombosis of left internal jugular vein: Secondary | ICD-10-CM

## 2018-04-10 DIAGNOSIS — R945 Abnormal results of liver function studies: Secondary | ICD-10-CM

## 2018-04-10 DIAGNOSIS — R350 Frequency of micturition: Secondary | ICD-10-CM | POA: Diagnosis not present

## 2018-04-10 DIAGNOSIS — D701 Agranulocytosis secondary to cancer chemotherapy: Secondary | ICD-10-CM

## 2018-04-10 DIAGNOSIS — Z993 Dependence on wheelchair: Secondary | ICD-10-CM | POA: Diagnosis not present

## 2018-04-10 DIAGNOSIS — Z95828 Presence of other vascular implants and grafts: Secondary | ICD-10-CM

## 2018-04-10 DIAGNOSIS — G620 Drug-induced polyneuropathy: Secondary | ICD-10-CM | POA: Diagnosis not present

## 2018-04-10 DIAGNOSIS — Z170 Estrogen receptor positive status [ER+]: Secondary | ICD-10-CM | POA: Diagnosis not present

## 2018-04-10 DIAGNOSIS — R5383 Other fatigue: Secondary | ICD-10-CM

## 2018-04-10 DIAGNOSIS — Z5111 Encounter for antineoplastic chemotherapy: Secondary | ICD-10-CM | POA: Diagnosis not present

## 2018-04-10 DIAGNOSIS — R42 Dizziness and giddiness: Secondary | ICD-10-CM | POA: Diagnosis not present

## 2018-04-10 DIAGNOSIS — R630 Anorexia: Secondary | ICD-10-CM

## 2018-04-10 LAB — CBC WITH DIFFERENTIAL/PLATELET
Abs Immature Granulocytes: 0 10*3/uL (ref 0.00–0.07)
BASOS PCT: 1 %
Band Neutrophils: 2 %
Basophils Absolute: 0 10*3/uL (ref 0.0–0.1)
Eosinophils Absolute: 0 10*3/uL (ref 0.0–0.5)
Eosinophils Relative: 0 %
HCT: 21.3 % — ABNORMAL LOW (ref 36.0–46.0)
Hemoglobin: 6.4 g/dL — ABNORMAL LOW (ref 12.0–15.0)
Lymphocytes Relative: 13 %
Lymphs Abs: 0.4 10*3/uL — ABNORMAL LOW (ref 0.7–4.0)
MCH: 29.5 pg (ref 26.0–34.0)
MCHC: 30 g/dL (ref 30.0–36.0)
MCV: 98.2 fL (ref 80.0–100.0)
MONO ABS: 0.3 10*3/uL (ref 0.1–1.0)
Monocytes Relative: 10 %
Myelocytes: 1 %
NRBC: 3.9 % — AB (ref 0.0–0.2)
Neutro Abs: 2.1 10*3/uL (ref 1.7–7.7)
Neutrophils Relative %: 73 %
Platelets: 233 10*3/uL (ref 150–400)
RBC: 2.17 MIL/uL — ABNORMAL LOW (ref 3.87–5.11)
RDW: 17.2 % — ABNORMAL HIGH (ref 11.5–15.5)
Smear Review: ADEQUATE
WBC: 2.8 10*3/uL — ABNORMAL LOW (ref 4.0–10.5)

## 2018-04-10 LAB — COMPREHENSIVE METABOLIC PANEL
ALK PHOS: 218 U/L — AB (ref 38–126)
ALT: 56 U/L — ABNORMAL HIGH (ref 0–44)
AST: 156 U/L — ABNORMAL HIGH (ref 15–41)
Albumin: 2.8 g/dL — ABNORMAL LOW (ref 3.5–5.0)
Anion gap: 8 (ref 5–15)
BUN: 17 mg/dL (ref 8–23)
CO2: 23 mmol/L (ref 22–32)
Calcium: 8.5 mg/dL — ABNORMAL LOW (ref 8.9–10.3)
Chloride: 107 mmol/L (ref 98–111)
Creatinine, Ser: 1.02 mg/dL — ABNORMAL HIGH (ref 0.44–1.00)
GFR calc Af Amer: >60 mL/min (ref >60)
GFR calc non Af Amer: 57 mL/min — ABNORMAL LOW (ref >60)
Glucose, Bld: 131 mg/dL — ABNORMAL HIGH (ref 70–99)
Potassium: 4.1 mmol/L (ref 3.5–5.1)
SODIUM: 138 mmol/L (ref 135–145)
Total Bilirubin: 0.7 mg/dL (ref 0.3–1.2)
Total Protein: 5.5 g/dL — ABNORMAL LOW (ref 6.5–8.1)

## 2018-04-10 LAB — PREPARE RBC (CROSSMATCH)

## 2018-04-10 MED ORDER — HEPARIN SOD (PORK) LOCK FLUSH 100 UNIT/ML IV SOLN
500.0000 [IU] | Freq: Once | INTRAVENOUS | Status: AC
  Administered 2018-04-10: 500 [IU] via INTRAVENOUS
  Filled 2018-04-10: qty 5

## 2018-04-10 MED ORDER — SODIUM CHLORIDE 0.9% IV SOLUTION
250.0000 mL | Freq: Once | INTRAVENOUS | Status: AC
  Administered 2018-04-10: 250 mL via INTRAVENOUS
  Filled 2018-04-10: qty 250

## 2018-04-10 MED ORDER — MEGESTROL ACETATE 40 MG PO TABS: 40.0000 mg | ORAL_TABLET | Freq: Every day | ORAL | 2 refills | Status: DC

## 2018-04-10 MED ORDER — DIPHENHYDRAMINE HCL 50 MG/ML IJ SOLN
25.0000 mg | Freq: Once | INTRAMUSCULAR | Status: AC
  Administered 2018-04-10: 25 mg via INTRAVENOUS
  Filled 2018-04-10: qty 1

## 2018-04-10 MED ORDER — SODIUM CHLORIDE 0.9% FLUSH
10.0000 mL | Freq: Once | INTRAVENOUS | Status: AC
  Filled 2018-04-10: qty 10

## 2018-04-10 MED ORDER — SODIUM CHLORIDE 0.9% FLUSH
10.0000 mL | Freq: Once | INTRAVENOUS | Status: AC
  Administered 2018-04-10: 10 mL via INTRAVENOUS
  Filled 2018-04-10: qty 10

## 2018-04-10 MED ORDER — ACETAMINOPHEN 325 MG PO TABS
650.0000 mg | ORAL_TABLET | Freq: Once | ORAL | Status: AC
  Administered 2018-04-10: 650 mg via ORAL
  Filled 2018-04-10: qty 2

## 2018-04-10 NOTE — Progress Notes (Signed)
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  INTERVAL HISTORY: Patient returns to clinic today for repeat laboratory work and further evaluation.  She reports that since receiving treatment 1 week ago she is only had one good day" 6 bad days".  She continues to have a poor appetite and persistent weakness and fatigue.  She continues to have a decreased performance status. She has a chronic peripheral neuropathy, but no other neurologic complaints.  She denies any fevers.  She denies any chest pain, cough, or hemoptysis. She denies any nausea, vomiting, constipation, or diarrhea. She has no urinary complaints.  Patient feels generally terrible, but offers no further specific complaints.  REVIEW OF SYSTEMS:   Review of Systems  Constitutional: Positive for malaise/fatigue and weight loss. Negative for fever.  Eyes: Negative.  Negative for blurred vision, double vision and pain.  Respiratory: Negative.  Negative for cough and shortness of breath.   Cardiovascular: Negative.  Negative for chest pain and leg swelling.  Gastrointestinal: Negative.  Negative for abdominal pain.  Genitourinary: Negative.  Negative for dysuria and flank pain.  Musculoskeletal: Negative for back pain and falls.  Skin: Negative.  Negative for rash.  Neurological: Positive for tingling, sensory change and weakness. Negative for focal weakness and headaches.  Endo/Heme/Allergies: Negative.   Psychiatric/Behavioral: Negative.  Negative for memory loss. The patient is not nervous/anxious.     As per HPI. Otherwise, a complete review of systems is negative.  PAST MEDICAL  HISTORY: Past Medical History:  Diagnosis Date   Anxiety    Back pain    occasionally   Breast cancer ([place]) 2009   left   Depression    takes Paxil and Wellbutrin daily   Diabetes ([place])    takes Metformin daily   type 2    Genetic testing 02/03/2017   Multi-Cancer panel (83 genes) @ [facility] - No pathogenic mutations detected   GERD (gastroesophageal reflux disease)    occasional   History of bronchitis 2 yrs ago   History of kidney stones    Hyperlipidemia    takes Atorvastatin daily   Hypertension    no meds   Insomnia    takes Ambien nightly   Insomnia    takes gabapentin nightly   Joint pain    Mood swings    Osteoarthritis of knee    Personal history of chemotherapy 12/05/2016   Mets from Breast Cancer   Personal history of radiation therapy 11/2016   Pneumonia    PONV (postoperative nausea and vomiting)    Seasonal allergies    takes Allegra daily    PAST SURGICAL HISTORY: Past Surgical History:  Procedure Laterality Date   BREAST BIOPSY  2009   cataract surgery Bilateral    COLONOSCOPY     IR RADIOLOGIST EVAL & MGMT  01/29/2018   JOINT REPLACEMENT Left 2014   knee   KNEE ARTHROSCOPY Left    x 5   MASTECTOMY Left    port a cath placed     PORTACATH PLACEMENT N/A 09/17/2015   Procedure: INSERTION PORT-A-CATH;  Surgeon: Jules Husbands, MD;  Location: [facility];  Service: General;  Laterality: N/A;   RADIOLOGY WITH ANESTHESIA N/A 02/20/2018  Procedure: CT WITH ANESTHESIA MICROWAVE THERMAL ABLATION-LIVER;  Surgeon: Jacqulynn Cadet, MD;  Location: [facility];  Service: Anesthesiology;  Laterality: N/A;   TOOTH EXTRACTION     TOTAL SHOULDER ARTHROPLASTY Right 06/03/2015   Procedure: TOTAL SHOULDER ARTHROPLASTY;  Surgeon: Tania Ade, MD;  Location: [facility];  Service: Orthopedics;  Laterality: Right;  Right total shoulder arthroplasty   TOTAL SHOULDER REPLACEMENT Right 06/03/2015   TUBAL LIGATION      FAMILY HISTORY: Father with  non-Hodgkin's lymphoma, 2 paternal aunts with breast cancer.     ADVANCED DIRECTIVES:    HEALTH MAINTENANCE: Social History   Tobacco Use   Smoking status: Never Smoker   Smokeless tobacco: Never Used  Substance Use Topics   Alcohol use: Yes    Alcohol/week: 0.0 standard drinks    Comment: occasionally wine   Drug use: Yes    Types: Marijuana    Comment: cannabis with no extra  low dose edibles  for neuropathy     Colonoscopy:  PAP:  Bone density:  Lipid panel:  Allergies  Allergen Reactions   Ace Inhibitors Cough   Latex Itching   Morphine And Related Itching    Caused her to itch terribly. Would prefer if given to take with a benadryl   Penicillins Rash    Has patient had a PCN reaction causing immediate rash, facial/tongue/throat swelling, SOB or lightheadedness with hypotension: No Has patient had a PCN reaction causing severe rash involving mucus membranes or skin necrosis: No Has patient had a PCN reaction that required hospitalization No Has patient had a PCN reaction occurring within the last 10 years: No If all of the above answers are "NO", then may proceed with Cephalosporin use.    Current Outpatient Medications  Medication Sig Dispense Refill   acetaminophen (TYLENOL) 500 MG tablet Take 500 mg by mouth every 6 (six) hours as needed for mild pain or moderate pain.      atorvastatin (LIPITOR) 10 MG tablet Take 10 mg by mouth at bedtime.      buPROPion (WELLBUTRIN XL) 150 MG 24 hr tablet Take 150 mg by mouth at bedtime.      Calcium-Magnesium-Vitamin D (CALCIUM 1200+D3 PO) Take 1 tablet by mouth daily.      Cyanocobalamin 5000 MCG CAPS Take 5,000 mcg by mouth daily.      ferrous sulfate 325 (65 FE) MG EC tablet Take 325 mg by mouth daily.      fexofenadine (ALLEGRA) 180 MG tablet Take 180 mg by mouth at bedtime.      gabapentin (NEURONTIN) 300 MG capsule Take 300-600 mg by mouth See admin instructions. Take 300 mg by mouth in the morning and  600 mg at night     lidocaine-prilocaine (EMLA) cream Apply 1 application topically as needed. Apply to port 1-2 hours prior to chemotherapy appointment. Cover with plastic wrap. 30 g 0   metFORMIN (GLUCOPHAGE) 850 MG tablet Take 850 mg by mouth 2 (two) times daily with a meal.      Oxycodone HCl 10 MG TABS Take 1 tablet (10 mg total) by mouth every 4 (four) hours as needed. Ok to take every 4-6 hours as needed. 90 tablet 0   pantoprazole (PROTONIX) 40 MG tablet Take 40 mg by mouth at bedtime.      PARoxetine (PAXIL) 40 MG tablet Take 40 mg by mouth at bedtime.      prochlorperazine (COMPAZINE) 10 MG tablet Take 1 tablet (10 mg total) by mouth every 6 (six) hours  as needed for nausea or vomiting. 30 tablet 3   sodium chloride (MURO 128) 5 % ophthalmic solution Place 1 drop into both eyes 4 (four) times daily.     zolpidem (AMBIEN) 10 MG tablet Take 10 mg by mouth at bedtime as needed (sleep).      No current facility-administered medications for this visit.    Facility-Administered Medications Ordered in Other Visits  Medication Dose Route Frequency Provider Last Rate Last Dose   0.9 %  sodium chloride infusion (Manually program via Guardrails IV Fluids)  250 mL Intravenous Once Lloyd Huger, MD       acetaminophen (TYLENOL) tablet 650 mg  650 mg Oral Once Lloyd Huger, MD       diphenhydrAMINE (BENADRYL) injection 25 mg  25 mg Intravenous Once Lloyd Huger, MD       heparin lock flush 100 unit/mL  500 Units Intravenous Once Lloyd Huger, MD       sodium chloride flush (NS) 0.9 % injection 10 mL  10 mL Intravenous Once Lloyd Huger, MD        OBJECTIVE: Vitals:   04/10/18 1006  BP: 122/80  Pulse: (!) 106  Temp: (!) 95.8 F (35.4 C)     Body mass index is 28.8 kg/m.    ECOG FS:3 - Symptomatic, >50% confined to bed  General: Ill-appearing, no acute distress.  Sitting in a wheelchair. Eyes: Pink conjunctiva, anicteric sclera. HEENT:  Normocephalic, moist mucous membranes, clear oropharnyx. Lungs: Clear to auscultation bilaterally. Heart: Regular rate and rhythm. No rubs, murmurs, or gallops. Abdomen: Soft, nontender, nondistended. No organomegaly noted, normoactive bowel sounds. Musculoskeletal: No edema, cyanosis, or clubbing. Neuro: Alert, answering all questions appropriately. Cranial nerves grossly intact. Skin: No rashes or petechiae noted. Psych: Normal affect.  LAB RESULTS:  Lab Results  Component Value Date   NA 138 04/10/2018   K 4.1 04/10/2018   CL 107 04/10/2018   CO2 23 04/10/2018   GLUCOSE 131 (H) 04/10/2018   BUN 17 04/10/2018   CREATININE 1.02 (H) 04/10/2018   CALCIUM 8.5 (L) 04/10/2018   PROT 5.5 (L) 04/10/2018   ALBUMIN 2.8 (L) 04/10/2018   AST 156 (H) 04/10/2018   ALT 56 (H) 04/10/2018   ALKPHOS 218 (H) 04/10/2018   BILITOT 0.7 04/10/2018   GFRNONAA 57 (L) 04/10/2018   GFRAA >60 04/10/2018    Lab Results  Component Value Date   WBC 2.8 (L) 04/10/2018   NEUTROABS 2.1 04/10/2018   HGB 6.4 (L) 04/10/2018   HCT 21.3 (L) 04/10/2018   MCV 98.2 04/10/2018   PLT 233 04/10/2018     STUDIES: No results found.  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  PLAN:    1.  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  Patient wishes to continue with palliative treatment.  She has discontinued Ibrance and letrozole.  Patient received dose reduced [place] 1 week ago.  At best, she likely can only tolerate treatment every other week.  She last received Zometa on April 04, 2018.  Proceed with blood transfusion today.  Return to clinic in 1 week for further evaluation and consideration of continuation of treatment.   2.  Anemia: Patient's hemoglobin has trended down and is  now 6.4.  Proceed with 1 unit packed red blood cells today.  3.  Peripheral neuropathy: Chronic and unchanged.  Continue gabapentin as prescribed. Neuropathy managed by neurology.  4. Back pain: MRI results from May 04, 2017 did not reveal metastatic disease.  Continue symptomatic treatment.  5.  Left IJ clot: Ultrasound results reviewed independently.  Patient has been instructed to discontinue Eliquis. 6.  Neutropenia: Mild, secondary to [place].  Monitor. 7.  Dental complaints: Patient has been instructed if she needs dental intervention, to proceed as scheduled per her dentist.  Any invasive procedures likely will need to be timed several days prior to day 1 of her next cycle. 8.  Memory complaints: MRI of the brain on December 21, 2017 confirmed widespread osseous metastatic disease, but no intracranial abnormality was noted. 9.  Elevated liver enzymes: AST and ALT remain chronically elevated.  Monitor. 10.  Poor appetite: Patient has tried Remeron and dexamethasone without much effect.  She was given a prescription for Megace today.  Patient expressed understanding and was in agreement with this plan. She also understands that She can call clinic at any time with any questions, concerns, or complaints.   Breast cancer   Staging form: Breast, AJCC 7th Edition     Pathologic stage from 08/11/2014: Stage IIIA (T0, N2a, cM0) - Signed by Lloyd Huger, MD on 08/11/2014   Lloyd Huger, MD   04/10/2018 11:44 AM

## 2018-04-10 NOTE — Progress Notes (Signed)
Radiation Oncology Follow up Note Old patient new area bone metastases  Name: Amanda [name]   Date:   04/10/2018 MRN:  [id number] DOB: May 10, 1950    This 68 y.o. female presents to the clinic today for reevaluation of bone metastasis in patient with known.stage IV metastatic breast cancer with both bone and liver involvement. Complaining of increased lower back pain  REFERRING PROVIDER: Sofie Hartigan, MD  HPI: patient is a 68 year old female well-known to department having received multiple areas of palliative radiation therapy to her skeletal metastasis from known stage IV breast cancer. She is declining. She has from recent scans marked progression disease in her liver. We have treated her lumbar spine as well as SI joints in the past. She is wheelchair-bound at this time. Most recent bone scan does show some still some increased uptake in the SI joints region. Lumbar spine looks fine..  COMPLICATIONS OF TREATMENT: none  FOLLOW UP COMPLIANCE: keeps appointments   PHYSICAL EXAM:  There were no vitals taken for this visit. No motor or sensory levels are noted in her lower extremities. Range of motion of her lower extremities does not elicit pain pain is elicited deep palpation on the SI joints.Well-developed well-nourished patient in NAD. HEENT reveals PERLA, EOMI, discs not visualized.  Oral cavity is clear. No oral mucosal lesions are identified. Neck is clear without evidence of cervical or supraclavicular adenopathy. Lungs are clear to A&P. Cardiac examination is essentially unremarkable with regular rate and rhythm without murmur rub or thrill. Abdomen is benign with no organomegaly or masses noted. Motor sensory and DTR levels are equal and symmetric in the upper and lower extremities. Cranial nerves II through XII are grossly intact. Proprioception is intact. No peripheral adenopathy or edema is identified. No motor or sensory levels are noted. Crude visual fields are within normal  range.  RADIOLOGY RESULTS: bone scan CT scans reviewed  PLAN: at this time I to give her a single fraction of 800 cGy to her SI joints. Will use three-dimensional treatment planning and spare critical structures such as small bowel bladder and rectum. Risks and benefits of treatment including possible diarrhea fatigue alteration of blood counts were explained to the patient and her husband. Both seem to compress my treatment plan well. I have personally set up and ordered CT simulation for tomorrow.  I would like to take this opportunity to thank you for allowing me to participate in the care of your patient.Noreene Filbert, MD

## 2018-04-10 NOTE — Progress Notes (Signed)
Patient woke up with N/V last night which does not happen a lot.  Patient reports that she has some good and bad days with today being a good day so far.

## 2018-04-11 ENCOUNTER — Ambulatory Visit
Admission: RE | Admit: 2018-04-11 | Discharge: 2018-04-11 | Disposition: A | Discharge status: Home / Self Care | Payer: Medicare Other | Source: Ambulatory Visit | Attending: Radiation Oncology | Admitting: Radiation Oncology

## 2018-04-11 ENCOUNTER — Other Ambulatory Visit: Payer: Self-pay

## 2018-04-11 ENCOUNTER — Ambulatory Visit: Payer: Medicare Other | Admitting: Oncology

## 2018-04-11 ENCOUNTER — Ambulatory Visit: Payer: Medicare Other

## 2018-04-11 ENCOUNTER — Other Ambulatory Visit: Payer: Medicare Other

## 2018-04-11 DIAGNOSIS — C7951 Secondary malignant neoplasm of bone: Secondary | ICD-10-CM | POA: Insufficient documentation

## 2018-04-11 DIAGNOSIS — Z79899 Other long term (current) drug therapy: Secondary | ICD-10-CM | POA: Diagnosis not present

## 2018-04-11 DIAGNOSIS — D649 Anemia, unspecified: Secondary | ICD-10-CM | POA: Diagnosis not present

## 2018-04-11 DIAGNOSIS — M8580 Other specified disorders of bone density and structure, unspecified site: Secondary | ICD-10-CM | POA: Diagnosis not present

## 2018-04-11 DIAGNOSIS — F329 Major depressive disorder, single episode, unspecified: Secondary | ICD-10-CM | POA: Diagnosis not present

## 2018-04-11 DIAGNOSIS — Z7984 Long term (current) use of oral hypoglycemic drugs: Secondary | ICD-10-CM | POA: Insufficient documentation

## 2018-04-11 DIAGNOSIS — G4700 Insomnia, unspecified: Secondary | ICD-10-CM | POA: Insufficient documentation

## 2018-04-11 DIAGNOSIS — Z96652 Presence of left artificial knee joint: Secondary | ICD-10-CM | POA: Insufficient documentation

## 2018-04-11 DIAGNOSIS — E1142 Type 2 diabetes mellitus with diabetic polyneuropathy: Secondary | ICD-10-CM | POA: Insufficient documentation

## 2018-04-11 DIAGNOSIS — J302 Other seasonal allergic rhinitis: Secondary | ICD-10-CM | POA: Diagnosis not present

## 2018-04-11 DIAGNOSIS — C787 Secondary malignant neoplasm of liver and intrahepatic bile duct: Secondary | ICD-10-CM | POA: Insufficient documentation

## 2018-04-11 DIAGNOSIS — Z170 Estrogen receptor positive status [ER+]: Secondary | ICD-10-CM | POA: Diagnosis not present

## 2018-04-11 DIAGNOSIS — Z510 Encounter for antineoplastic radiation therapy: Secondary | ICD-10-CM | POA: Insufficient documentation

## 2018-04-11 DIAGNOSIS — C50912 Malignant neoplasm of unspecified site of left female breast: Secondary | ICD-10-CM | POA: Diagnosis not present

## 2018-04-11 DIAGNOSIS — M549 Dorsalgia, unspecified: Secondary | ICD-10-CM | POA: Insufficient documentation

## 2018-04-11 DIAGNOSIS — Z96611 Presence of right artificial shoulder joint: Secondary | ICD-10-CM | POA: Diagnosis not present

## 2018-04-11 DIAGNOSIS — I10 Essential (primary) hypertension: Secondary | ICD-10-CM | POA: Diagnosis not present

## 2018-04-11 DIAGNOSIS — E785 Hyperlipidemia, unspecified: Secondary | ICD-10-CM | POA: Diagnosis not present

## 2018-04-11 LAB — TYPE AND SCREEN
ABO/RH(D): O POS
Antibody Screen: NEGATIVE
Unit division: 0

## 2018-04-11 LAB — BPAM RBC
Blood Product Expiration Date: 202003282359
ISSUE DATE / TIME: 202003251307
Unit Type and Rh: 5100

## 2018-04-12 DIAGNOSIS — C787 Secondary malignant neoplasm of liver and intrahepatic bile duct: Secondary | ICD-10-CM | POA: Diagnosis not present

## 2018-04-12 DIAGNOSIS — M8580 Other specified disorders of bone density and structure, unspecified site: Secondary | ICD-10-CM | POA: Diagnosis not present

## 2018-04-12 DIAGNOSIS — Z510 Encounter for antineoplastic radiation therapy: Secondary | ICD-10-CM | POA: Diagnosis not present

## 2018-04-12 DIAGNOSIS — Z170 Estrogen receptor positive status [ER+]: Secondary | ICD-10-CM | POA: Diagnosis not present

## 2018-04-12 DIAGNOSIS — C50912 Malignant neoplasm of unspecified site of left female breast: Secondary | ICD-10-CM | POA: Diagnosis not present

## 2018-04-12 DIAGNOSIS — C7951 Secondary malignant neoplasm of bone: Secondary | ICD-10-CM | POA: Diagnosis not present

## 2018-04-16 ENCOUNTER — Other Ambulatory Visit: Payer: Self-pay

## 2018-04-17 ENCOUNTER — Other Ambulatory Visit: Payer: Self-pay

## 2018-04-17 ENCOUNTER — Ambulatory Visit
Admission: RE | Admit: 2018-04-17 | Discharge: 2018-04-17 | Disposition: A | Discharge status: Home / Self Care | Payer: Medicare Other | Source: Ambulatory Visit | Attending: Radiation Oncology | Admitting: Radiation Oncology

## 2018-04-17 ENCOUNTER — Inpatient Hospital Stay: Payer: Medicare Other | Attending: Hospice and Palliative Medicine | Admitting: Hospice and Palliative Medicine

## 2018-04-17 ENCOUNTER — Encounter: Payer: Self-pay | Admitting: Oncology

## 2018-04-17 ENCOUNTER — Inpatient Hospital Stay: Payer: Medicare Other

## 2018-04-17 ENCOUNTER — Inpatient Hospital Stay (HOSPITAL_BASED_OUTPATIENT_CLINIC_OR_DEPARTMENT_OTHER): Payer: Medicare Other | Admitting: Oncology

## 2018-04-17 VITALS — BP 165/78 | HR 80 | Temp 97.6°F | Ht 64.0 in | Wt 175.0 lb

## 2018-04-17 DIAGNOSIS — Z96652 Presence of left artificial knee joint: Secondary | ICD-10-CM | POA: Insufficient documentation

## 2018-04-17 DIAGNOSIS — Z510 Encounter for antineoplastic radiation therapy: Secondary | ICD-10-CM | POA: Diagnosis not present

## 2018-04-17 DIAGNOSIS — I82C12 Acute embolism and thrombosis of left internal jugular vein: Secondary | ICD-10-CM | POA: Diagnosis not present

## 2018-04-17 DIAGNOSIS — C7951 Secondary malignant neoplasm of bone: Secondary | ICD-10-CM | POA: Insufficient documentation

## 2018-04-17 DIAGNOSIS — D6481 Anemia due to antineoplastic chemotherapy: Secondary | ICD-10-CM | POA: Diagnosis not present

## 2018-04-17 DIAGNOSIS — Z96611 Presence of right artificial shoulder joint: Secondary | ICD-10-CM | POA: Insufficient documentation

## 2018-04-17 DIAGNOSIS — G893 Neoplasm related pain (acute) (chronic): Secondary | ICD-10-CM

## 2018-04-17 DIAGNOSIS — C50919 Malignant neoplasm of unspecified site of unspecified female breast: Secondary | ICD-10-CM | POA: Insufficient documentation

## 2018-04-17 DIAGNOSIS — M8580 Other specified disorders of bone density and structure, unspecified site: Secondary | ICD-10-CM | POA: Diagnosis not present

## 2018-04-17 DIAGNOSIS — Z5189 Encounter for other specified aftercare: Secondary | ICD-10-CM | POA: Diagnosis not present

## 2018-04-17 DIAGNOSIS — R630 Anorexia: Secondary | ICD-10-CM | POA: Diagnosis not present

## 2018-04-17 DIAGNOSIS — I10 Essential (primary) hypertension: Secondary | ICD-10-CM | POA: Diagnosis not present

## 2018-04-17 DIAGNOSIS — R5382 Chronic fatigue, unspecified: Secondary | ICD-10-CM | POA: Diagnosis not present

## 2018-04-17 DIAGNOSIS — Z515 Encounter for palliative care: Secondary | ICD-10-CM | POA: Diagnosis not present

## 2018-04-17 DIAGNOSIS — E785 Hyperlipidemia, unspecified: Secondary | ICD-10-CM | POA: Insufficient documentation

## 2018-04-17 DIAGNOSIS — Z170 Estrogen receptor positive status [ER+]: Secondary | ICD-10-CM

## 2018-04-17 DIAGNOSIS — J302 Other seasonal allergic rhinitis: Secondary | ICD-10-CM | POA: Diagnosis not present

## 2018-04-17 DIAGNOSIS — C50912 Malignant neoplasm of unspecified site of left female breast: Secondary | ICD-10-CM

## 2018-04-17 DIAGNOSIS — D649 Anemia, unspecified: Secondary | ICD-10-CM | POA: Diagnosis not present

## 2018-04-17 DIAGNOSIS — Z5111 Encounter for antineoplastic chemotherapy: Secondary | ICD-10-CM | POA: Diagnosis not present

## 2018-04-17 DIAGNOSIS — D701 Agranulocytosis secondary to cancer chemotherapy: Secondary | ICD-10-CM

## 2018-04-17 DIAGNOSIS — F329 Major depressive disorder, single episode, unspecified: Secondary | ICD-10-CM | POA: Diagnosis not present

## 2018-04-17 DIAGNOSIS — E1142 Type 2 diabetes mellitus with diabetic polyneuropathy: Secondary | ICD-10-CM | POA: Diagnosis not present

## 2018-04-17 DIAGNOSIS — Z79899 Other long term (current) drug therapy: Secondary | ICD-10-CM | POA: Diagnosis not present

## 2018-04-17 DIAGNOSIS — G620 Drug-induced polyneuropathy: Secondary | ICD-10-CM | POA: Diagnosis not present

## 2018-04-17 DIAGNOSIS — C787 Secondary malignant neoplasm of liver and intrahepatic bile duct: Secondary | ICD-10-CM | POA: Insufficient documentation

## 2018-04-17 DIAGNOSIS — M549 Dorsalgia, unspecified: Secondary | ICD-10-CM | POA: Diagnosis not present

## 2018-04-17 DIAGNOSIS — Z7984 Long term (current) use of oral hypoglycemic drugs: Secondary | ICD-10-CM | POA: Diagnosis not present

## 2018-04-17 DIAGNOSIS — Z95828 Presence of other vascular implants and grafts: Secondary | ICD-10-CM

## 2018-04-17 DIAGNOSIS — R52 Pain, unspecified: Secondary | ICD-10-CM | POA: Diagnosis not present

## 2018-04-17 DIAGNOSIS — R531 Weakness: Secondary | ICD-10-CM

## 2018-04-17 DIAGNOSIS — T451X5A Adverse effect of antineoplastic and immunosuppressive drugs, initial encounter: Principal | ICD-10-CM

## 2018-04-17 DIAGNOSIS — G4700 Insomnia, unspecified: Secondary | ICD-10-CM | POA: Diagnosis not present

## 2018-04-17 LAB — CBC WITH DIFFERENTIAL/PLATELET
Abs Immature Granulocytes: 0.09 10*3/uL — ABNORMAL HIGH (ref 0.00–0.07)
Basophils Absolute: 0 10*3/uL (ref 0.0–0.1)
Basophils Relative: 1 %
Eosinophils Absolute: 0 10*3/uL (ref 0.0–0.5)
Eosinophils Relative: 0 %
HCT: 24.7 % — ABNORMAL LOW (ref 36.0–46.0)
Hemoglobin: 7.3 g/dL — ABNORMAL LOW (ref 12.0–15.0)
Immature Granulocytes: 3 %
Lymphocytes Relative: 21 %
Lymphs Abs: 0.6 10*3/uL — ABNORMAL LOW (ref 0.7–4.0)
MCH: 28.5 pg (ref 26.0–34.0)
MCHC: 29.6 g/dL — ABNORMAL LOW (ref 30.0–36.0)
MCV: 96.5 fL (ref 80.0–100.0)
Monocytes Absolute: 1.1 10*3/uL — ABNORMAL HIGH (ref 0.1–1.0)
Monocytes Relative: 40 %
Neutro Abs: 1 10*3/uL — ABNORMAL LOW (ref 1.7–7.7)
Neutrophils Relative %: 35 %
Platelets: 241 10*3/uL (ref 150–400)
RBC: 2.56 MIL/uL — ABNORMAL LOW (ref 3.87–5.11)
RDW: 18.4 % — ABNORMAL HIGH (ref 11.5–15.5)
Smear Review: ADEQUATE
WBC: 2.8 10*3/uL — ABNORMAL LOW (ref 4.0–10.5)
nRBC: 1.1 % — ABNORMAL HIGH (ref 0.0–0.2)

## 2018-04-17 LAB — COMPREHENSIVE METABOLIC PANEL
ALT: 49 U/L — ABNORMAL HIGH (ref 0–44)
AST: 112 U/L — ABNORMAL HIGH (ref 15–41)
Albumin: 2.9 g/dL — ABNORMAL LOW (ref 3.5–5.0)
Alkaline Phosphatase: 215 U/L — ABNORMAL HIGH (ref 38–126)
Anion gap: 6 (ref 5–15)
BUN: 20 mg/dL (ref 8–23)
CO2: 21 mmol/L — ABNORMAL LOW (ref 22–32)
Calcium: 8 mg/dL — ABNORMAL LOW (ref 8.9–10.3)
Chloride: 109 mmol/L (ref 98–111)
Creatinine, Ser: 1.04 mg/dL — ABNORMAL HIGH (ref 0.44–1.00)
GFR calc Af Amer: >60 mL/min (ref >60)
GFR calc non Af Amer: 56 mL/min — ABNORMAL LOW (ref >60)
Glucose, Bld: 118 mg/dL — ABNORMAL HIGH (ref 70–99)
Potassium: 4.5 mmol/L (ref 3.5–5.1)
Sodium: 136 mmol/L (ref 135–145)
Total Bilirubin: 0.6 mg/dL (ref 0.3–1.2)
Total Protein: 5.5 g/dL — ABNORMAL LOW (ref 6.5–8.1)

## 2018-04-17 LAB — SAMPLE TO BLOOD BANK

## 2018-04-17 MED ORDER — SODIUM CHLORIDE 0.9 % IV SOLN
1.1000 mg/m2 | Freq: Once | INTRAVENOUS | Status: AC
  Administered 2018-04-17: 2.15 mg via INTRAVENOUS
  Filled 2018-04-17: qty 4.3

## 2018-04-17 MED ORDER — PROCHLORPERAZINE MALEATE 10 MG PO TABS
10.0000 mg | ORAL_TABLET | Freq: Once | ORAL | Status: AC
  Administered 2018-04-17: 10 mg via ORAL
  Filled 2018-04-17: qty 1

## 2018-04-17 MED ORDER — ALPRAZOLAM 0.25 MG PO TABS: 0.2500 mg | ORAL_TABLET | Freq: Every day | ORAL | 0 refills | Status: DC

## 2018-04-17 MED ORDER — PEGFILGRASTIM 6 MG/0.6ML ~~LOC~~ PSKT: 6.0000 mg | PREFILLED_SYRINGE | Freq: Once | SUBCUTANEOUS | Status: DC

## 2018-04-17 MED ORDER — SODIUM CHLORIDE 0.9 % IV SOLN
Freq: Once | INTRAVENOUS | Status: AC
  Administered 2018-04-17: 11:00:00 via INTRAVENOUS
  Filled 2018-04-17: qty 250

## 2018-04-17 MED ORDER — HEPARIN SOD (PORK) LOCK FLUSH 100 UNIT/ML IV SOLN
500.0000 [IU] | Freq: Once | INTRAVENOUS | Status: AC | PRN
  Administered 2018-04-17: 500 [IU]
  Filled 2018-04-17: qty 5

## 2018-04-17 MED ORDER — SODIUM CHLORIDE 0.9% FLUSH
10.0000 mL | Freq: Once | INTRAVENOUS | Status: AC
  Administered 2018-04-17: 10 mL via INTRAVENOUS
  Filled 2018-04-17: qty 10

## 2018-04-17 NOTE — Progress Notes (Signed)
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  INTERVAL HISTORY: Patient returns to clinic today for repeat laboratory work, further evaluation, and consideration of her next infusion of Halaven.  Patient still has a decreased performance status, but it has improved over the last week.  She continues to have a poor appetite.  She also has significant insomnia.  She has chronic weakness and fatigue. She has a chronic peripheral neuropathy, but no other neurologic complaints.  She denies any recent fevers or illnesses.  She denies any chest pain, shortness of breath, cough, or hemoptysis. She denies any nausea, vomiting, constipation, or diarrhea. She has no urinary complaints.  Patient offers no further specific complaints today.  REVIEW OF SYSTEMS:   Review of Systems  Constitutional: Positive for malaise/fatigue and weight loss. Negative for fever.  Eyes: Negative.  Negative for blurred vision, double vision and pain.  Respiratory: Negative.  Negative for cough and shortness of breath.   Cardiovascular: Negative.  Negative for chest pain and leg swelling.  Gastrointestinal: Negative.  Negative for abdominal pain.  Genitourinary: Negative.  Negative for dysuria and flank pain.  Musculoskeletal: Negative for back pain and falls.  Skin: Negative.  Negative for rash.  Neurological: Positive for tingling, sensory change and weakness. Negative for focal weakness and headaches.  Endo/Heme/Allergies: Negative.   Psychiatric/Behavioral: Negative for memory loss. The patient has insomnia. The patient is not nervous/anxious.     As per HPI. Otherwise, a complete  review of systems is negative.  PAST MEDICAL HISTORY: Past Medical History:  Diagnosis Date  . Anxiety   . Back pain    occasionally  . Breast cancer ([place]) 2009   left  . Depression    takes Paxil and Wellbutrin daily  . Diabetes ([place])    takes Metformin daily   type 2   . Genetic testing 02/03/2017   Multi-Cancer panel (83 genes) @ [facility] - No pathogenic mutations detected  . GERD (gastroesophageal reflux disease)    occasional  . History of bronchitis 2 yrs ago  . History of kidney stones   . Hyperlipidemia    takes Atorvastatin daily  . Hypertension    no meds  . Insomnia    takes Ambien nightly  . Insomnia    takes gabapentin nightly  . Joint pain   . Mood swings   . Osteoarthritis of knee   . Personal history of chemotherapy 12/05/2016   Mets from Breast Cancer  . Personal history of radiation therapy 11/2016  . Pneumonia   . PONV (postoperative nausea and vomiting)   . Seasonal allergies    takes Allegra daily    PAST SURGICAL HISTORY: Past Surgical History:  Procedure Laterality Date  . BREAST BIOPSY  2009  . cataract surgery Bilateral   . COLONOSCOPY    . IR RADIOLOGIST EVAL & MGMT  01/29/2018  . JOINT REPLACEMENT Left 2014   knee  . KNEE ARTHROSCOPY Left    x 5  . MASTECTOMY Left   . port a cath placed    . PORTACATH PLACEMENT N/A 09/17/2015   Procedure: INSERTION PORT-A-CATH;  Surgeon: Jules Husbands, MD;  Location: [facility];  Service: General;  Laterality: N/A;  .  RADIOLOGY WITH ANESTHESIA N/A 02/20/2018   Procedure: CT WITH ANESTHESIA MICROWAVE THERMAL ABLATION-LIVER;  Surgeon: Jacqulynn Cadet, MD;  Location: [facility];  Service: Anesthesiology;  Laterality: N/A;  . TOOTH EXTRACTION    . TOTAL SHOULDER ARTHROPLASTY Right 06/03/2015   Procedure: TOTAL SHOULDER ARTHROPLASTY;  Surgeon: Tania Ade, MD;  Location: [facility];  Service: Orthopedics;  Laterality: Right;  Right total shoulder arthroplasty  . TOTAL SHOULDER REPLACEMENT Right 06/03/2015  .  TUBAL LIGATION      FAMILY HISTORY: Father with non-Hodgkin's lymphoma, 2 paternal aunts with breast cancer.     ADVANCED DIRECTIVES:    HEALTH MAINTENANCE: Social History   Tobacco Use  . Smoking status: Never Smoker  . Smokeless tobacco: Never Used  Substance Use Topics  . Alcohol use: Yes    Alcohol/week: 0.0 standard drinks    Comment: occasionally wine  . Drug use: Yes    Types: Marijuana    Comment: cannabis with no extra  low dose edibles  for neuropathy     Colonoscopy:  PAP:  Bone density:  Lipid panel:  Allergies  Allergen Reactions  . Ace Inhibitors Cough  . Latex Itching  . Morphine And Related Itching    Caused her to itch terribly. Would prefer if given to take with a benadryl  . Penicillins Rash    Has patient had a PCN reaction causing immediate rash, facial/tongue/throat swelling, SOB or lightheadedness with hypotension: No Has patient had a PCN reaction causing severe rash involving mucus membranes or skin necrosis: No Has patient had a PCN reaction that required hospitalization No Has patient had a PCN reaction occurring within the last 10 years: No If all of the above answers are "NO", then may proceed with Cephalosporin use.    Current Outpatient Medications  Medication Sig Dispense Refill  . acetaminophen (TYLENOL) 500 MG tablet Take 500 mg by mouth every 6 (six) hours as needed for mild pain or moderate pain.     Marland Kitchen atorvastatin (LIPITOR) 10 MG tablet Take 10 mg by mouth at bedtime.     Marland Kitchen buPROPion (WELLBUTRIN XL) 150 MG 24 hr tablet Take 150 mg by mouth at bedtime.     . Calcium-Magnesium-Vitamin D (CALCIUM 1200+D3 PO) Take 1 tablet by mouth daily.     . Cyanocobalamin 5000 MCG CAPS Take 5,000 mcg by mouth daily.     . ferrous sulfate 325 (65 FE) MG EC tablet Take 325 mg by mouth daily.     . fexofenadine (ALLEGRA) 180 MG tablet Take 180 mg by mouth at bedtime.     . gabapentin (NEURONTIN) 300 MG capsule Take 300-600 mg by mouth See admin  instructions. Take 300 mg by mouth in the morning and 600 mg at night    . lidocaine-prilocaine (EMLA) cream Apply 1 application topically as needed. Apply to port 1-2 hours prior to chemotherapy appointment. Cover with plastic wrap. 30 g 0  . megestrol (MEGACE) 40 MG tablet Take 1 tablet (40 mg total) by mouth daily. 30 tablet 2  . metFORMIN (GLUCOPHAGE) 850 MG tablet Take 850 mg by mouth 2 (two) times daily with a meal.     . Oxycodone HCl 10 MG TABS Take 1 tablet (10 mg total) by mouth every 4 (four) hours as needed. Ok to take every 4-6 hours as needed. 90 tablet 0  . pantoprazole (PROTONIX) 40 MG tablet Take 40 mg by mouth at bedtime.     Marland Kitchen PARoxetine (PAXIL) 40 MG tablet Take 40 mg  by mouth at bedtime.     . prochlorperazine (COMPAZINE) 10 MG tablet Take 1 tablet (10 mg total) by mouth every 6 (six) hours as needed for nausea or vomiting. 30 tablet 3  . sodium chloride (MURO 128) 5 % ophthalmic solution Place 1 drop into both eyes 4 (four) times daily.    Marland Kitchen zolpidem (AMBIEN) 10 MG tablet Take 10 mg by mouth at bedtime as needed (sleep).     . ALPRAZolam (XANAX) 0.25 MG tablet Take 1 tablet (0.25 mg total) by mouth at bedtime. 30 tablet 0   No current facility-administered medications for this visit.     OBJECTIVE: Vitals:   04/17/18 1008  BP: (!) 165/78  Pulse: 80  Temp: 97.6 F (36.4 C)     Body mass index is 30.04 kg/m.    ECOG FS:3 - Symptomatic, >50% confined to bed  General: Ill-appearing, no acute distress.  Sitting in a wheelchair. Eyes: Pink conjunctiva, anicteric sclera. HEENT: Normocephalic, moist mucous membranes, clear oropharnyx. Lungs: Clear to auscultation bilaterally. Heart: Regular rate and rhythm. No rubs, murmurs, or gallops. Abdomen: Soft, nontender, nondistended. No organomegaly noted, normoactive bowel sounds. Musculoskeletal: No edema, cyanosis, or clubbing. Neuro: Alert, answering all questions appropriately. Cranial nerves grossly intact. Skin: No  rashes or petechiae noted. Psych: Normal affect.  LAB RESULTS:  Lab Results  Component Value Date   NA 136 04/17/2018   K 4.5 04/17/2018   CL 109 04/17/2018   CO2 21 (L) 04/17/2018   GLUCOSE 118 (H) 04/17/2018   BUN 20 04/17/2018   CREATININE 1.04 (H) 04/17/2018   CALCIUM 8.0 (L) 04/17/2018   PROT 5.5 (L) 04/17/2018   ALBUMIN 2.9 (L) 04/17/2018   AST 112 (H) 04/17/2018   ALT 49 (H) 04/17/2018   ALKPHOS 215 (H) 04/17/2018   BILITOT 0.6 04/17/2018   GFRNONAA 56 (L) 04/17/2018   GFRAA >60 04/17/2018    Lab Results  Component Value Date   WBC 2.8 (L) 04/17/2018   NEUTROABS 1.0 (L) 04/17/2018   HGB 7.3 (L) 04/17/2018   HCT 24.7 (L) 04/17/2018   MCV 96.5 04/17/2018   PLT 241 04/17/2018     STUDIES: No results found.  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  PLAN:    1.  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  Patient wishes to continue with palliative treatment.  She has discontinued Ibrance and letrozole.  It appears patient can only receive Halaven every other week.  She has a borderline neutropenia and will receive Neulasta with this treatment.  Proceed with will be considered cycle 2, day 15 of treatment.  Return to clinic in 1 week for laboratory work and further evaluation.  Patient will then return to clinic in 2 weeks for further evaluation and consideration of cycle 3, day 1.   2.  Anemia: Mildly improved with 1 unit packed red blood cells last week.  Patient will likely need transfusion next week.   3.  Peripheral neuropathy: Chronic and unchanged.  Continue gabapentin as prescribed. Neuropathy managed by neurology.  4. Back pain: MRI results from May 04, 2017 did not reveal metastatic disease.  Continue symptomatic treatment.   5.  Left IJ clot: Ultrasound results reviewed independently.  Patient has been instructed to discontinue Eliquis. 6.  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  Patient will likely  require Neulasta with the remainder of her cycles. 7.  Dental complaints: Patient has been instructed if she needs dental intervention, to proceed as scheduled per her dentist.  Any invasive procedures likely will need to be timed several days prior to day 1 of her next cycle. 8.  Memory complaints: MRI of the brain on December 21, 2017 confirmed widespread osseous metastatic disease, but no intracranial abnormality was noted. 9.  Elevated liver enzymes: Chronic and unchanged.  Monitor. 10.  Poor appetite: Patient has tried Remeron and dexamethasone without much effect.  She only took 2 days of Megace prior to discontinuing. 11.  Insomnia: Patient has been instructed to discontinue Ambien and was given a prescription for Xanax.  Patient expressed understanding and was in agreement with this plan. She also understands that She can call clinic at any time with any questions, concerns, or complaints.   Breast cancer   Staging form: Breast, AJCC 7th Edition     Pathologic stage from 08/11/2014: Stage IIIA (T0, N2a, cM0) - Signed by Lloyd Huger, MD on 08/11/2014   Lloyd Huger, MD   04/18/2018 7:12 AM

## 2018-04-17 NOTE — Progress Notes (Signed)
Patient stated that she had not been able to sleep due to the steroids that were prescribed even with her ambien.

## 2018-04-17 NOTE — Progress Notes (Signed)
[place]  Telephone:([phone] [phone]   Name: Amanda [name] Date: 04/17/2018 MRN: [id number]  DOB: [birth year]  Patient Care Team: Sofie Hartigan, MD as PCP - General (Family Medicine) Dahlia Byes, Marjory Lies, MD as Consulting Physician (General Surgery)    REASON FOR CONSULTATION: Palliative Care consult requested for this 68 y.o. 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  She was found to have disease progression on Ibrance and letrozole.  Patient previously had verbalized a desire not to pursue further treatment in the event of disease progression.  However, she is decided to start [place].  Palliative care was consulted to help address goals and follow for support.  SOCIAL HISTORY:    Patient lives at home with her husband.  She has a son and stepson who are involved.  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  Her husband owns a "ozone" business.  ADVANCE DIRECTIVES:  Not on file. Has HCPOA and living will at home.   CODE STATUS: DNR  PAST MEDICAL HISTORY: Past Medical History:  Diagnosis Date  . Anxiety   . Back pain    occasionally  . Breast cancer ([place]) 2009   left  . Depression    takes Paxil and Wellbutrin daily  . Diabetes ([place])    takes Metformin daily   type 2   . Genetic testing 02/03/2017   Multi-Cancer panel (83 genes) @ [facility] - No pathogenic mutations detected  . GERD (gastroesophageal reflux disease)    occasional  . History of bronchitis 2 yrs ago  . History of kidney stones   . Hyperlipidemia    takes Atorvastatin daily  . Hypertension    no meds  . Insomnia    takes Ambien nightly  . Insomnia    takes gabapentin nightly  . Joint pain   . Mood swings   . Osteoarthritis of knee   . Personal history of chemotherapy 12/05/2016   Mets from Breast Cancer  . Personal history of radiation therapy 11/2016  .  Pneumonia   . PONV (postoperative nausea and vomiting)   . Seasonal allergies    takes Allegra daily    PAST SURGICAL HISTORY:  Past Surgical History:  Procedure Laterality Date  . BREAST BIOPSY  2009  . cataract surgery Bilateral   . COLONOSCOPY    . IR RADIOLOGIST EVAL & MGMT  01/29/2018  . JOINT REPLACEMENT Left 2014   knee  . KNEE ARTHROSCOPY Left    x 5  . MASTECTOMY Left   . port a cath placed    . PORTACATH PLACEMENT N/A 09/17/2015   Procedure: INSERTION PORT-A-CATH;  Surgeon: Jules Husbands, MD;  Location: [facility];  Service: General;  Laterality: N/A;  . RADIOLOGY WITH ANESTHESIA N/A 02/20/2018   Procedure: CT WITH ANESTHESIA MICROWAVE THERMAL ABLATION-LIVER;  Surgeon: Jacqulynn Cadet, MD;  Location: [facility];  Service: Anesthesiology;  Laterality: N/A;  . TOOTH EXTRACTION    . TOTAL SHOULDER ARTHROPLASTY Right 06/03/2015   Procedure: TOTAL SHOULDER ARTHROPLASTY;  Surgeon: Tania Ade, MD;  Location: [facility];  Service: Orthopedics;  Laterality: Right;  Right total shoulder arthroplasty  . TOTAL SHOULDER REPLACEMENT Right 06/03/2015  . TUBAL LIGATION      HEMATOLOGY/ONCOLOGY HISTORY:    Cancer of left female breast  ([place])   07/27/2014 Initial Diagnosis    Cancer of left female breast ([place])  Metastatic breast cancer ([place])   09/24/2015 Initial Diagnosis    Metastatic breast cancer ([place])    02/27/2018 -  Chemotherapy    The patient had pegfilgrastim (NEULASTA) injection 6 mg, 6 mg, Subcutaneous, Once, 1 of 3 cycles eriBULin mesylate (HALAVEN) 2.75 mg in sodium chloride 0.9 % 100 mL chemo infusion, 1.4 mg/m2 = 2.75 mg, Intravenous,  Once, 2 of 4 cycles Dose modification: 1.1 mg/m2 (original dose 1.4 mg/m2, Cycle 1, Reason: Dose not tolerated) Administration: 2.75 mg (02/27/2018), 2.15 mg (03/20/2018), 2.15 mg (04/04/2018), 2.15 mg (04/17/2018)  for chemotherapy treatment.      ALLERGIES:  is allergic to ace inhibitors; latex; morphine and related; and penicillins.   MEDICATIONS:  Current Outpatient Medications  Medication Sig Dispense Refill  . acetaminophen (TYLENOL) 500 MG tablet Take 500 mg by mouth every 6 (six) hours as needed for mild pain or moderate pain.     Marland Kitchen atorvastatin (LIPITOR) 10 MG tablet Take 10 mg by mouth at bedtime.     Marland Kitchen buPROPion (WELLBUTRIN XL) 150 MG 24 hr tablet Take 150 mg by mouth at bedtime.     . Calcium-Magnesium-Vitamin D (CALCIUM 1200+D3 PO) Take 1 tablet by mouth daily.     . Cyanocobalamin 5000 MCG CAPS Take 5,000 mcg by mouth daily.     . ferrous sulfate 325 (65 FE) MG EC tablet Take 325 mg by mouth daily.     . fexofenadine (ALLEGRA) 180 MG tablet Take 180 mg by mouth at bedtime.     . gabapentin (NEURONTIN) 300 MG capsule Take 300-600 mg by mouth See admin instructions. Take 300 mg by mouth in the morning and 600 mg at night    . lidocaine-prilocaine (EMLA) cream Apply 1 application topically as needed. Apply to port 1-2 hours prior to chemotherapy appointment. Cover with plastic wrap. 30 g 0  . megestrol (MEGACE) 40 MG tablet Take 1 tablet (40 mg total) by mouth daily. 30 tablet 2  . metFORMIN (GLUCOPHAGE) 850 MG tablet Take 850 mg by mouth 2 (two) times daily with a meal.     . Oxycodone HCl 10 MG TABS Take 1 tablet (10 mg total) by mouth every 4 (four) hours as needed. Ok to take every 4-6 hours as needed. 90 tablet 0  . pantoprazole (PROTONIX) 40 MG tablet Take 40 mg by mouth at bedtime.     Marland Kitchen PARoxetine (PAXIL) 40 MG tablet Take 40 mg by mouth at bedtime.     . prochlorperazine (COMPAZINE) 10 MG tablet Take 1 tablet (10 mg total) by mouth every 6 (six) hours as needed for nausea or vomiting. 30 tablet 3  . sodium chloride (MURO 128) 5 % ophthalmic solution Place 1 drop into both eyes 4 (four) times daily.    Marland Kitchen zolpidem (AMBIEN) 10 MG tablet Take 10 mg by mouth at bedtime as needed (sleep).      No current facility-administered medications for this visit.     VITAL SIGNS: There were no vitals taken for this  visit. There were no vitals filed for this visit.  Estimated body mass index is 30.04 kg/m as calculated from the following:   Height as of an earlier encounter on 04/17/18: '5\' 4"'$  (1.626 m).   Weight as of an earlier encounter on 04/17/18: 175 lb (79.4 kg).  LABS: CBC:    Component Value Date/Time   WBC 2.8 (L) 04/17/2018 0910   HGB 7.3 (L) 04/17/2018 0910   HGB 13.2 12/20/2011 0927   HCT 24.7 (  L) 04/17/2018 0910   HCT 40.6 12/20/2011 0927   PLT 241 04/17/2018 0910   PLT 235 12/20/2011 0927   MCV 96.5 04/17/2018 0910   MCV 96 12/20/2011 0927   NEUTROABS 1.0 (L) 04/17/2018 0910   NEUTROABS 1.7 12/20/2011 0927   LYMPHSABS 0.6 (L) 04/17/2018 0910   LYMPHSABS 1.4 12/20/2011 0927   MONOABS 1.1 (H) 04/17/2018 0910   MONOABS 0.4 12/20/2011 0927   EOSABS 0.0 04/17/2018 0910   EOSABS 0.4 12/20/2011 0927   BASOSABS 0.0 04/17/2018 0910   BASOSABS 0.0 12/20/2011 0927   Comprehensive Metabolic Panel:    Component Value Date/Time   NA 136 04/17/2018 0910   NA 139 12/20/2011 0927   K 4.5 04/17/2018 0910   K 4.1 12/20/2011 0927   CL 109 04/17/2018 0910   CL 102 12/20/2011 0927   CO2 21 (L) 04/17/2018 0910   CO2 26 12/20/2011 0927   BUN 20 04/17/2018 0910   BUN 19 (H) 12/20/2011 0927   CREATININE 1.04 (H) 04/17/2018 0910   CREATININE 1.15 12/20/2011 0927   GLUCOSE 118 (H) 04/17/2018 0910   GLUCOSE 251 (H) 12/20/2011 0927   CALCIUM 8.0 (L) 04/17/2018 0910   CALCIUM 8.6 12/20/2011 0927   AST 112 (H) 04/17/2018 0910   AST 49 (H) 12/20/2011 0927   ALT 49 (H) 04/17/2018 0910   ALT 57 12/20/2011 0927   ALKPHOS 215 (H) 04/17/2018 0910   ALKPHOS 61 12/20/2011 0927   BILITOT 0.6 04/17/2018 0910   BILITOT 0.4 12/20/2011 0927   PROT 5.5 (L) 04/17/2018 0910   PROT 6.8 12/20/2011 0927   ALBUMIN 2.9 (L) 04/17/2018 0910   ALBUMIN 3.6 12/20/2011 0927    RADIOGRAPHIC STUDIES: No results found.  PERFORMANCE STATUS (ECOG) : 1 - Symptomatic but completely ambulatory  Review of Systems As  noted above. Otherwise, a complete review of systems is negative.  Physical Exam General: NAD, frail appearing Pulmonary: unlabored Extremities: no edema Skin: no rashes Neurological: Weakness but otherwise nonfocal  IMPRESSION: I met with patient today in the clinic. She was seen while patient was receiving IV fluids.   Patient reports persistent insomnia and attributes it to the dexamethasone she was previously taking.  Patient also says she had difficulty sleeping when mirtazapine was tried.  We discussed sleep hygiene and trying to reduce daytime napping.  Dr. Grayland Ormond has started her on alprazolam at bedtime.  Patient says her appetite has been better the past few days. Weight appears stable.   Pain is well controlled on current regimen. She takes CBD oil at home with good effect.   Case and plan discussed with Dr. Grayland Ormond.   PLAN: -Continue current scope of treatment -Continue oxycodone prn for pain -Alprazolam prn for sleep -RTC in 1 month   Patient expressed understanding and was in agreement with this plan. She also understands that She can call clinic at any time with any questions, concerns, or complaints.    Time Total: 15 minutes  Visit consisted of counseling and education dealing with the complex and emotionally intense issues of symptom management and palliative care in the setting of serious and potentially life-threatening illness.Greater than 50%  of this time was spent counseling and coordinating care related to the above assessment and plan.  Signed by: Altha Harm, PhD, NP-C [phone] (Work Cell)

## 2018-04-19 ENCOUNTER — Other Ambulatory Visit: Payer: Self-pay

## 2018-04-19 ENCOUNTER — Inpatient Hospital Stay: Payer: Medicare Other

## 2018-04-19 DIAGNOSIS — D6481 Anemia due to antineoplastic chemotherapy: Secondary | ICD-10-CM | POA: Diagnosis not present

## 2018-04-19 DIAGNOSIS — Z5111 Encounter for antineoplastic chemotherapy: Secondary | ICD-10-CM | POA: Diagnosis not present

## 2018-04-19 DIAGNOSIS — C7951 Secondary malignant neoplasm of bone: Secondary | ICD-10-CM | POA: Diagnosis not present

## 2018-04-19 DIAGNOSIS — D701 Agranulocytosis secondary to cancer chemotherapy: Secondary | ICD-10-CM | POA: Diagnosis not present

## 2018-04-19 DIAGNOSIS — C50919 Malignant neoplasm of unspecified site of unspecified female breast: Secondary | ICD-10-CM | POA: Diagnosis not present

## 2018-04-19 DIAGNOSIS — C787 Secondary malignant neoplasm of liver and intrahepatic bile duct: Secondary | ICD-10-CM | POA: Diagnosis not present

## 2018-04-19 MED ORDER — PEGFILGRASTIM INJECTION 6 MG/0.6ML ~~LOC~~
6.0000 mg | PREFILLED_SYRINGE | Freq: Once | SUBCUTANEOUS | Status: AC
  Administered 2018-04-19: 6 mg via SUBCUTANEOUS

## 2018-04-23 ENCOUNTER — Other Ambulatory Visit: Payer: Self-pay

## 2018-04-23 ENCOUNTER — Other Ambulatory Visit: Payer: Self-pay | Admitting: Oncology

## 2018-04-24 ENCOUNTER — Inpatient Hospital Stay: Payer: Medicare Other

## 2018-04-24 ENCOUNTER — Encounter: Payer: Self-pay | Admitting: Oncology

## 2018-04-24 ENCOUNTER — Inpatient Hospital Stay (HOSPITAL_BASED_OUTPATIENT_CLINIC_OR_DEPARTMENT_OTHER): Payer: Medicare Other | Admitting: Oncology

## 2018-04-24 ENCOUNTER — Other Ambulatory Visit: Payer: Self-pay

## 2018-04-24 VITALS — BP 130/75 | HR 105 | Temp 98.4°F | Ht 64.0 in | Wt 173.0 lb

## 2018-04-24 DIAGNOSIS — G4700 Insomnia, unspecified: Secondary | ICD-10-CM | POA: Diagnosis not present

## 2018-04-24 DIAGNOSIS — D701 Agranulocytosis secondary to cancer chemotherapy: Secondary | ICD-10-CM | POA: Diagnosis not present

## 2018-04-24 DIAGNOSIS — R5382 Chronic fatigue, unspecified: Secondary | ICD-10-CM

## 2018-04-24 DIAGNOSIS — C50919 Malignant neoplasm of unspecified site of unspecified female breast: Secondary | ICD-10-CM

## 2018-04-24 DIAGNOSIS — G620 Drug-induced polyneuropathy: Secondary | ICD-10-CM

## 2018-04-24 DIAGNOSIS — C787 Secondary malignant neoplasm of liver and intrahepatic bile duct: Secondary | ICD-10-CM

## 2018-04-24 DIAGNOSIS — D649 Anemia, unspecified: Secondary | ICD-10-CM

## 2018-04-24 DIAGNOSIS — R945 Abnormal results of liver function studies: Secondary | ICD-10-CM

## 2018-04-24 DIAGNOSIS — R531 Weakness: Secondary | ICD-10-CM | POA: Diagnosis not present

## 2018-04-24 DIAGNOSIS — D6481 Anemia due to antineoplastic chemotherapy: Secondary | ICD-10-CM | POA: Diagnosis not present

## 2018-04-24 DIAGNOSIS — I82C12 Acute embolism and thrombosis of left internal jugular vein: Secondary | ICD-10-CM | POA: Diagnosis not present

## 2018-04-24 DIAGNOSIS — C7951 Secondary malignant neoplasm of bone: Secondary | ICD-10-CM | POA: Diagnosis not present

## 2018-04-24 DIAGNOSIS — Z95828 Presence of other vascular implants and grafts: Secondary | ICD-10-CM

## 2018-04-24 DIAGNOSIS — Z5111 Encounter for antineoplastic chemotherapy: Secondary | ICD-10-CM | POA: Diagnosis not present

## 2018-04-24 LAB — CBC WITH DIFFERENTIAL/PLATELET
Abs Immature Granulocytes: 3.8 10*3/uL — ABNORMAL HIGH (ref 0.00–0.07)
Band Neutrophils: 11 %
Basophils Absolute: 0.2 10*3/uL — ABNORMAL HIGH (ref 0.0–0.1)
Basophils Relative: 1 %
Blasts: 1 %
Eosinophils Absolute: 0.2 10*3/uL (ref 0.0–0.5)
Eosinophils Relative: 1 %
HCT: 23.1 % — ABNORMAL LOW (ref 36.0–46.0)
Hemoglobin: 6.8 g/dL — ABNORMAL LOW (ref 12.0–15.0)
Lymphocytes Relative: 6 %
Lymphs Abs: 1 10*3/uL (ref 0.7–4.0)
MCH: 28.7 pg (ref 26.0–34.0)
MCHC: 29.4 g/dL — ABNORMAL LOW (ref 30.0–36.0)
MCV: 97.5 fL (ref 80.0–100.0)
Metamyelocytes Relative: 7 %
Monocytes Absolute: 2.9 10*3/uL — ABNORMAL HIGH (ref 0.1–1.0)
Monocytes Relative: 17 %
Myelocytes: 13 %
Neutro Abs: 8.9 10*3/uL — ABNORMAL HIGH (ref 1.7–7.7)
Neutrophils Relative %: 41 %
Platelets: 194 10*3/uL (ref 150–400)
Promyelocytes Relative: 2 %
RBC: 2.37 MIL/uL — ABNORMAL LOW (ref 3.87–5.11)
RDW: 19.9 % — ABNORMAL HIGH (ref 11.5–15.5)
Smear Review: NORMAL
WBC: 17.2 10*3/uL — ABNORMAL HIGH (ref 4.0–10.5)
nRBC: 4.4 % — ABNORMAL HIGH (ref 0.0–0.2)

## 2018-04-24 LAB — COMPREHENSIVE METABOLIC PANEL
ALT: 51 U/L — ABNORMAL HIGH (ref 0–44)
AST: 115 U/L — ABNORMAL HIGH (ref 15–41)
Albumin: 2.9 g/dL — ABNORMAL LOW (ref 3.5–5.0)
Alkaline Phosphatase: 258 U/L — ABNORMAL HIGH (ref 38–126)
Anion gap: 8 (ref 5–15)
BUN: 19 mg/dL (ref 8–23)
CO2: 21 mmol/L — ABNORMAL LOW (ref 22–32)
Calcium: 8.6 mg/dL — ABNORMAL LOW (ref 8.9–10.3)
Chloride: 107 mmol/L (ref 98–111)
Creatinine, Ser: 1.03 mg/dL — ABNORMAL HIGH (ref 0.44–1.00)
GFR calc Af Amer: >60 mL/min (ref >60)
GFR calc non Af Amer: 57 mL/min — ABNORMAL LOW (ref >60)
Glucose, Bld: 183 mg/dL — ABNORMAL HIGH (ref 70–99)
Potassium: 3.7 mmol/L (ref 3.5–5.1)
Sodium: 136 mmol/L (ref 135–145)
Total Bilirubin: 0.8 mg/dL (ref 0.3–1.2)
Total Protein: 5.5 g/dL — ABNORMAL LOW (ref 6.5–8.1)

## 2018-04-24 LAB — PREPARE RBC (CROSSMATCH)

## 2018-04-24 MED ORDER — DIPHENHYDRAMINE HCL 50 MG/ML IJ SOLN
25.0000 mg | Freq: Once | INTRAMUSCULAR | Status: AC
  Administered 2018-04-24: 25 mg via INTRAVENOUS
  Filled 2018-04-24: qty 1

## 2018-04-24 MED ORDER — SODIUM CHLORIDE 0.9% FLUSH
10.0000 mL | Freq: Once | INTRAVENOUS | Status: AC
  Administered 2018-04-24: 09:00:00 10 mL via INTRAVENOUS
  Filled 2018-04-24: qty 10

## 2018-04-24 MED ORDER — HEPARIN SOD (PORK) LOCK FLUSH 100 UNIT/ML IV SOLN
500.0000 [IU] | Freq: Every day | INTRAVENOUS | Status: AC | PRN
  Administered 2018-04-24: 500 [IU]
  Filled 2018-04-24: qty 5

## 2018-04-24 MED ORDER — SODIUM CHLORIDE 0.9% IV SOLUTION
250.0000 mL | Freq: Once | INTRAVENOUS | Status: AC
  Administered 2018-04-24: 250 mL via INTRAVENOUS
  Filled 2018-04-24: qty 250

## 2018-04-24 MED ORDER — ACETAMINOPHEN 325 MG PO TABS
650.0000 mg | ORAL_TABLET | Freq: Once | ORAL | Status: AC
  Administered 2018-04-24: 650 mg via ORAL
  Filled 2018-04-24: qty 2

## 2018-04-24 NOTE — Progress Notes (Signed)
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  INTERVAL HISTORY: Patient returns to clinic today for repeat laboratory work, further evaluation, and consideration of blood transfusion.  She continues to have a decreased performance status, but this is slightly improved.  She continues to have a poor appetite.  Her insomnia has improved.  She has chronic weakness and fatigue. She has a chronic peripheral neuropathy, but no other neurologic complaints.  She denies any recent fevers or illnesses.  She denies any chest pain, shortness of breath, cough, or hemoptysis. She denies any nausea, vomiting, constipation, or diarrhea. She has no urinary complaints.  Patient offers no further specific complaints today.  REVIEW OF SYSTEMS:   Review of Systems  Constitutional: Positive for malaise/fatigue and weight loss. Negative for fever.  Eyes: Negative.  Negative for blurred vision, double vision and pain.  Respiratory: Negative.  Negative for cough and shortness of breath.   Cardiovascular: Negative.  Negative for chest pain and leg swelling.  Gastrointestinal: Negative.  Negative for abdominal pain.  Genitourinary: Negative.  Negative for dysuria and flank pain.  Musculoskeletal: Negative for back pain and falls.  Skin: Negative.  Negative for rash.  Neurological: Positive for tingling, sensory change and weakness. Negative for focal weakness and headaches.  Endo/Heme/Allergies: Negative.   Psychiatric/Behavioral: Negative for memory loss. The patient has insomnia. The patient is not nervous/anxious.     As per HPI. Otherwise, a complete review of systems is  negative.  PAST MEDICAL HISTORY: Past Medical History:  Diagnosis Date  . Anxiety   . Back pain    occasionally  . Breast cancer ([place]) 2009   left  . Depression    takes Paxil and Wellbutrin daily  . Diabetes ([place])    takes Metformin daily   type 2   . Genetic testing 02/03/2017   Multi-Cancer panel (83 genes) @ [facility] - No pathogenic mutations detected  . GERD (gastroesophageal reflux disease)    occasional  . History of bronchitis 2 yrs ago  . History of kidney stones   . Hyperlipidemia    takes Atorvastatin daily  . Hypertension    no meds  . Insomnia    takes Ambien nightly  . Insomnia    takes gabapentin nightly  . Joint pain   . Mood swings   . Osteoarthritis of knee   . Personal history of chemotherapy 12/05/2016   Mets from Breast Cancer  . Personal history of radiation therapy 11/2016  . Pneumonia   . PONV (postoperative nausea and vomiting)   . Seasonal allergies    takes Allegra daily    PAST SURGICAL HISTORY: Past Surgical History:  Procedure Laterality Date  . BREAST BIOPSY  2009  . cataract surgery Bilateral   . COLONOSCOPY    . IR RADIOLOGIST EVAL & MGMT  01/29/2018  . JOINT REPLACEMENT Left 2014   knee  . KNEE ARTHROSCOPY Left    x 5  . MASTECTOMY Left   . port a cath placed    . PORTACATH PLACEMENT N/A 09/17/2015   Procedure: INSERTION PORT-A-CATH;  Surgeon: Jules Husbands, MD;  Location: [facility];  Service: General;  Laterality: N/A;  . RADIOLOGY WITH ANESTHESIA N/A  02/20/2018   Procedure: CT WITH ANESTHESIA MICROWAVE THERMAL ABLATION-LIVER;  Surgeon: Jacqulynn Cadet, MD;  Location: [facility];  Service: Anesthesiology;  Laterality: N/A;  . TOOTH EXTRACTION    . TOTAL SHOULDER ARTHROPLASTY Right 06/03/2015   Procedure: TOTAL SHOULDER ARTHROPLASTY;  Surgeon: Tania Ade, MD;  Location: [place];  Service: Orthopedics;  Laterality: Right;  Right total shoulder arthroplasty  . TOTAL SHOULDER REPLACEMENT Right 06/03/2015  . TUBAL LIGATION       FAMILY HISTORY: Father with non-Hodgkin's lymphoma, 2 paternal aunts with breast cancer.     ADVANCED DIRECTIVES:    HEALTH MAINTENANCE: Social History   Tobacco Use  . Smoking status: Never Smoker  . Smokeless tobacco: Never Used  Substance Use Topics  . Alcohol use: Yes    Alcohol/week: 0.0 standard drinks    Comment: occasionally wine  . Drug use: Yes    Types: Marijuana    Comment: cannabis with no extra  low dose edibles  for neuropathy     Colonoscopy:  PAP:  Bone density:  Lipid panel:  Allergies  Allergen Reactions  . Ace Inhibitors Cough  . Latex Itching  . Morphine And Related Itching    Caused her to itch terribly. Would prefer if given to take with a benadryl  . Penicillins Rash    Has patient had a PCN reaction causing immediate rash, facial/tongue/throat swelling, SOB or lightheadedness with hypotension: No Has patient had a PCN reaction causing severe rash involving mucus membranes or skin necrosis: No Has patient had a PCN reaction that required hospitalization No Has patient had a PCN reaction occurring within the last 10 years: No If all of the above answers are "NO", then may proceed with Cephalosporin use.    Current Outpatient Medications  Medication Sig Dispense Refill  . acetaminophen (TYLENOL) 500 MG tablet Take 500 mg by mouth every 6 (six) hours as needed for mild pain or moderate pain.     Marland Kitchen ALPRAZolam (XANAX) 0.25 MG tablet Take 1 tablet (0.25 mg total) by mouth at bedtime. 30 tablet 0  . atorvastatin (LIPITOR) 10 MG tablet Take 10 mg by mouth at bedtime.     Marland Kitchen buPROPion (WELLBUTRIN XL) 150 MG 24 hr tablet Take 150 mg by mouth at bedtime.     . Calcium-Magnesium-Vitamin D (CALCIUM 1200+D3 PO) Take 1 tablet by mouth daily.     . Cyanocobalamin 5000 MCG CAPS Take 5,000 mcg by mouth daily.     . ferrous sulfate 325 (65 FE) MG EC tablet Take 325 mg by mouth daily.     . fexofenadine (ALLEGRA) 180 MG tablet Take 180 mg by mouth at bedtime.      . gabapentin (NEURONTIN) 300 MG capsule Take 300-600 mg by mouth See admin instructions. Take 300 mg by mouth in the morning and 600 mg at night    . lidocaine-prilocaine (EMLA) cream Apply 1 application topically as needed. Apply to port 1-2 hours prior to chemotherapy appointment. Cover with plastic wrap. 30 g 0  . megestrol (MEGACE) 40 MG tablet Take 1 tablet (40 mg total) by mouth daily. 30 tablet 2  . metFORMIN (GLUCOPHAGE) 850 MG tablet Take 850 mg by mouth 2 (two) times daily with a meal.     . Oxycodone HCl 10 MG TABS Take 1 tablet (10 mg total) by mouth every 4 (four) hours as needed. Ok to take every 4-6 hours as needed. 90 tablet 0  . pantoprazole (PROTONIX) 40 MG tablet Take 40 mg by  mouth at bedtime.     Marland Kitchen PARoxetine (PAXIL) 40 MG tablet Take 40 mg by mouth at bedtime.     . prochlorperazine (COMPAZINE) 10 MG tablet Take 1 tablet (10 mg total) by mouth every 6 (six) hours as needed for nausea or vomiting. 30 tablet 3  . sodium chloride (MURO 128) 5 % ophthalmic solution Place 1 drop into both eyes 4 (four) times daily.    Marland Kitchen zolpidem (AMBIEN) 10 MG tablet Take 10 mg by mouth at bedtime as needed (sleep).      No current facility-administered medications for this visit.     OBJECTIVE: Vitals:   04/24/18 0945  BP: 130/75  Pulse: (!) 105  Temp: 98.4 F (36.9 C)     Body mass index is 29.7 kg/m.    ECOG FS:3 - Symptomatic, >50% confined to bed  General: Ill-appearing, no acute distress.  Sitting in a wheelchair. Eyes: Pink conjunctiva, anicteric sclera. HEENT: Normocephalic, moist mucous membranes, clear oropharnyx. Lungs: Clear to auscultation bilaterally. Heart: Regular rate and rhythm. No rubs, murmurs, or gallops. Abdomen: Soft, nontender, nondistended. No organomegaly noted, normoactive bowel sounds. Musculoskeletal: No edema, cyanosis, or clubbing. Neuro: Alert, answering all questions appropriately. Cranial nerves grossly intact. Skin: No rashes or petechiae noted.  Psych: Normal affect.    LAB RESULTS:  Lab Results  Component Value Date   NA 136 04/24/2018   K 3.7 04/24/2018   CL 107 04/24/2018   CO2 21 (L) 04/24/2018   GLUCOSE 183 (H) 04/24/2018   BUN 19 04/24/2018   CREATININE 1.03 (H) 04/24/2018   CALCIUM 8.6 (L) 04/24/2018   PROT 5.5 (L) 04/24/2018   ALBUMIN 2.9 (L) 04/24/2018   AST 115 (H) 04/24/2018   ALT 51 (H) 04/24/2018   ALKPHOS 258 (H) 04/24/2018   BILITOT 0.8 04/24/2018   GFRNONAA 57 (L) 04/24/2018   GFRAA >60 04/24/2018    Lab Results  Component Value Date   WBC 17.2 (H) 04/24/2018   NEUTROABS 8.9 (H) 04/24/2018   HGB 6.8 (L) 04/24/2018   HCT 23.1 (L) 04/24/2018   MCV 97.5 04/24/2018   PLT 194 04/24/2018     STUDIES: No results found.  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  PLAN:    1.  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  Patient wishes to continue with palliative treatment. It appears patient can only receive Halaven every other week.  No treatment today, but patient will have blood transfusion.  Return to clinic in 1 week for further evaluation and consideration of cycle 3, day 1.  Patient will also likely require Neulasta with each treatment. 2.  Anemia: Patient's hemoglobin has dropped to 6.8.  Proceed with 1 unit of packed red blood cells as above. 3.  Peripheral neuropathy: Chronic and unchanged.  Continue gabapentin as prescribed. Neuropathy managed by neurology.  4. Back pain: MRI results from May 04, 2017 did not reveal metastatic disease.  Continue symptomatic treatment.  5.  Left IJ clot: Ultrasound results reviewed independently.  Patient has been instructed to discontinue Eliquis. 6.  Neutropenia: Resolved.  Secondary to Halaven.  Patient will likely require  Neulasta with the remainder of her cycles. 7.  Dental complaints: Patient has been instructed if she needs dental intervention, to proceed as scheduled per her dentist.  Any invasive procedures likely will  need to be timed several days prior to day 1 of her next cycle. 8.  Memory complaints: MRI of the brain on December 21, 2017 confirmed widespread osseous metastatic disease, but no intracranial abnormality was noted. 9.  Elevated liver enzymes: Chronic and unchanged.  Monitor. 10.  Poor appetite: Patient has tried Remeron and dexamethasone without much effect.  She only took 2 days of Megace prior to discontinuing. 11.  Insomnia: Continue Xanax as prescribed.  Patient expressed understanding and was in agreement with this plan. She also understands that She can call clinic at any time with any questions, concerns, or complaints.   Breast cancer   Staging form: Breast, AJCC 7th Edition     Pathologic stage from 08/11/2014: Stage IIIA (T0, N2a, cM0) - Signed by Lloyd Huger, MD on 08/11/2014   Lloyd Huger, MD   04/25/2018 6:27 AM

## 2018-04-24 NOTE — Progress Notes (Signed)
Patient stated that she had been doing well with no complaints. 

## 2018-04-25 LAB — BPAM RBC
Blood Product Expiration Date: 202004222359
ISSUE DATE / TIME: 202004081133
Unit Type and Rh: 5100

## 2018-04-25 LAB — TYPE AND SCREEN
ABO/RH(D): O POS
Antibody Screen: NEGATIVE
Unit division: 0

## 2018-04-30 ENCOUNTER — Other Ambulatory Visit: Payer: Self-pay

## 2018-05-01 ENCOUNTER — Other Ambulatory Visit: Payer: Self-pay

## 2018-05-01 ENCOUNTER — Encounter: Payer: Self-pay | Admitting: Oncology

## 2018-05-01 ENCOUNTER — Inpatient Hospital Stay (HOSPITAL_BASED_OUTPATIENT_CLINIC_OR_DEPARTMENT_OTHER): Payer: Medicare Other | Admitting: Oncology

## 2018-05-01 ENCOUNTER — Inpatient Hospital Stay: Payer: Medicare Other

## 2018-05-01 VITALS — HR 94

## 2018-05-01 VITALS — BP 122/79 | HR 101 | Temp 98.3°F | Ht 64.0 in | Wt 170.0 lb

## 2018-05-01 DIAGNOSIS — C50919 Malignant neoplasm of unspecified site of unspecified female breast: Secondary | ICD-10-CM

## 2018-05-01 DIAGNOSIS — R531 Weakness: Secondary | ICD-10-CM

## 2018-05-01 DIAGNOSIS — Z95828 Presence of other vascular implants and grafts: Secondary | ICD-10-CM

## 2018-05-01 DIAGNOSIS — D649 Anemia, unspecified: Secondary | ICD-10-CM

## 2018-05-01 DIAGNOSIS — D701 Agranulocytosis secondary to cancer chemotherapy: Secondary | ICD-10-CM | POA: Diagnosis not present

## 2018-05-01 DIAGNOSIS — G4700 Insomnia, unspecified: Secondary | ICD-10-CM | POA: Diagnosis not present

## 2018-05-01 DIAGNOSIS — R630 Anorexia: Secondary | ICD-10-CM | POA: Diagnosis not present

## 2018-05-01 DIAGNOSIS — G620 Drug-induced polyneuropathy: Secondary | ICD-10-CM | POA: Diagnosis not present

## 2018-05-01 DIAGNOSIS — R748 Abnormal levels of other serum enzymes: Secondary | ICD-10-CM

## 2018-05-01 DIAGNOSIS — C7951 Secondary malignant neoplasm of bone: Secondary | ICD-10-CM | POA: Diagnosis not present

## 2018-05-01 DIAGNOSIS — C787 Secondary malignant neoplasm of liver and intrahepatic bile duct: Secondary | ICD-10-CM | POA: Diagnosis not present

## 2018-05-01 DIAGNOSIS — R5382 Chronic fatigue, unspecified: Secondary | ICD-10-CM

## 2018-05-01 DIAGNOSIS — Z5111 Encounter for antineoplastic chemotherapy: Secondary | ICD-10-CM | POA: Diagnosis not present

## 2018-05-01 DIAGNOSIS — D6481 Anemia due to antineoplastic chemotherapy: Secondary | ICD-10-CM | POA: Diagnosis not present

## 2018-05-01 LAB — CBC WITH DIFFERENTIAL/PLATELET
Abs Immature Granulocytes: 0.76 10*3/uL — ABNORMAL HIGH (ref 0.00–0.07)
Basophils Absolute: 0.1 10*3/uL (ref 0.0–0.1)
Basophils Relative: 1 %
Eosinophils Absolute: 0 10*3/uL (ref 0.0–0.5)
Eosinophils Relative: 0 %
HCT: 25.2 % — ABNORMAL LOW (ref 36.0–46.0)
Hemoglobin: 7.5 g/dL — ABNORMAL LOW (ref 12.0–15.0)
Immature Granulocytes: 6 %
Lymphocytes Relative: 5 %
Lymphs Abs: 0.7 10*3/uL (ref 0.7–4.0)
MCH: 29 pg (ref 26.0–34.0)
MCHC: 29.8 g/dL — ABNORMAL LOW (ref 30.0–36.0)
MCV: 97.3 fL (ref 80.0–100.0)
Monocytes Absolute: 1.4 10*3/uL — ABNORMAL HIGH (ref 0.1–1.0)
Monocytes Relative: 10 %
Neutro Abs: 10.6 10*3/uL — ABNORMAL HIGH (ref 1.7–7.7)
Neutrophils Relative %: 78 %
Platelets: 163 10*3/uL (ref 150–400)
RBC: 2.59 MIL/uL — ABNORMAL LOW (ref 3.87–5.11)
RDW: 20.6 % — ABNORMAL HIGH (ref 11.5–15.5)
WBC: 13.5 10*3/uL — ABNORMAL HIGH (ref 4.0–10.5)
nRBC: 1.6 % — ABNORMAL HIGH (ref 0.0–0.2)

## 2018-05-01 LAB — COMPREHENSIVE METABOLIC PANEL
ALT: 45 U/L — ABNORMAL HIGH (ref 0–44)
AST: 98 U/L — ABNORMAL HIGH (ref 15–41)
Albumin: 2.8 g/dL — ABNORMAL LOW (ref 3.5–5.0)
Alkaline Phosphatase: 263 U/L — ABNORMAL HIGH (ref 38–126)
Anion gap: 5 (ref 5–15)
BUN: 21 mg/dL (ref 8–23)
CO2: 25 mmol/L (ref 22–32)
Calcium: 8 mg/dL — ABNORMAL LOW (ref 8.9–10.3)
Chloride: 106 mmol/L (ref 98–111)
Creatinine, Ser: 0.91 mg/dL (ref 0.44–1.00)
GFR calc Af Amer: >60 mL/min (ref >60)
GFR calc non Af Amer: >60 mL/min (ref >60)
Glucose, Bld: 131 mg/dL — ABNORMAL HIGH (ref 70–99)
Potassium: 4.2 mmol/L (ref 3.5–5.1)
Sodium: 136 mmol/L (ref 135–145)
Total Bilirubin: 0.7 mg/dL (ref 0.3–1.2)
Total Protein: 5.5 g/dL — ABNORMAL LOW (ref 6.5–8.1)

## 2018-05-01 LAB — SAMPLE TO BLOOD BANK

## 2018-05-01 MED ORDER — PROCHLORPERAZINE MALEATE 10 MG PO TABS
10.0000 mg | ORAL_TABLET | Freq: Once | ORAL | Status: AC
  Administered 2018-05-01: 10 mg via ORAL
  Filled 2018-05-01: qty 1

## 2018-05-01 MED ORDER — HEPARIN SOD (PORK) LOCK FLUSH 100 UNIT/ML IV SOLN
500.0000 [IU] | Freq: Once | INTRAVENOUS | Status: AC
  Administered 2018-05-01: 500 [IU] via INTRAVENOUS
  Filled 2018-05-01: qty 5

## 2018-05-01 MED ORDER — PEGFILGRASTIM 6 MG/0.6ML ~~LOC~~ PSKT
6.0000 mg | PREFILLED_SYRINGE | Freq: Once | SUBCUTANEOUS | Status: AC
  Administered 2018-05-01: 6 mg via SUBCUTANEOUS
  Filled 2018-05-01: qty 0.6

## 2018-05-01 MED ORDER — SODIUM CHLORIDE 0.9% FLUSH
10.0000 mL | Freq: Once | INTRAVENOUS | Status: AC
  Administered 2018-05-01: 10 mL via INTRAVENOUS
  Filled 2018-05-01: qty 10

## 2018-05-01 MED ORDER — SODIUM CHLORIDE 0.9 % IV SOLN
1.1000 mg/m2 | Freq: Once | INTRAVENOUS | Status: AC
  Administered 2018-05-01: 2.15 mg via INTRAVENOUS
  Filled 2018-05-01: qty 4.3

## 2018-05-01 MED ORDER — SODIUM CHLORIDE 0.9 % IV SOLN
Freq: Once | INTRAVENOUS | Status: AC
  Administered 2018-05-01: 11:00:00 via INTRAVENOUS
  Filled 2018-05-01: qty 250

## 2018-05-01 NOTE — Progress Notes (Signed)
Patient lost 3 lbs since last week because she has no appetite. Patient denied fever, chills, nausea or vomiting.

## 2018-05-01 NOTE — Progress Notes (Signed)
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  INTERVAL HISTORY: Patient returns to clinic today for repeat laboratory, further evaluation, and consideration of Halaven. She continues to have a decreased performance status and poor appetite.  Her insomnia has improved.  She has chronic weakness and fatigue. She has a chronic peripheral neuropathy, but no other neurologic complaints.  She denies any recent fevers or illnesses.  She denies any chest pain, shortness of breath, cough, or hemoptysis. She denies any nausea, vomiting, constipation, or diarrhea. She has no urinary complaints.  Patient feels generally terrible, but offers no further specific complaints today.  REVIEW OF SYSTEMS:   Review of Systems  Constitutional: Positive for malaise/fatigue and weight loss. Negative for fever.  Eyes: Negative.  Negative for blurred vision, double vision and pain.  Respiratory: Negative.  Negative for cough and shortness of breath.   Cardiovascular: Negative.  Negative for chest pain and leg swelling.  Gastrointestinal: Negative.  Negative for abdominal pain.  Genitourinary: Negative.  Negative for dysuria and flank pain.  Musculoskeletal: Negative for back pain and falls.  Skin: Negative.  Negative for rash.  Neurological: Positive for tingling, sensory change and weakness. Negative for focal weakness and headaches.  Endo/Heme/Allergies: Negative.   Psychiatric/Behavioral: Negative for memory loss. The patient has insomnia. The patient is not nervous/anxious.     As per HPI. Otherwise, a complete review of systems is negative.  PAST MEDICAL HISTORY: Past  Medical History:  Diagnosis Date  . Anxiety   . Back pain    occasionally  . Breast cancer ([place]) 2009   left  . Depression    takes Paxil and Wellbutrin daily  . Diabetes ([place])    takes Metformin daily   type 2   . Genetic testing 02/03/2017   Multi-Cancer panel (83 genes) @ [facility] - No pathogenic mutations detected  . GERD (gastroesophageal reflux disease)    occasional  . History of bronchitis 2 yrs ago  . History of kidney stones   . Hyperlipidemia    takes Atorvastatin daily  . Hypertension    no meds  . Insomnia    takes Ambien nightly  . Insomnia    takes gabapentin nightly  . Joint pain   . Mood swings   . Osteoarthritis of knee   . Personal history of chemotherapy 12/05/2016   Mets from Breast Cancer  . Personal history of radiation therapy 11/2016  . Pneumonia   . PONV (postoperative nausea and vomiting)   . Seasonal allergies    takes Allegra daily    PAST SURGICAL HISTORY: Past Surgical History:  Procedure Laterality Date  . BREAST BIOPSY  2009  . cataract surgery Bilateral   . COLONOSCOPY    . IR RADIOLOGIST EVAL & MGMT  01/29/2018  . JOINT REPLACEMENT Left 2014   knee  . KNEE ARTHROSCOPY Left    x 5  . MASTECTOMY Left   . port a cath placed    . PORTACATH PLACEMENT N/A 09/17/2015   Procedure: INSERTION PORT-A-CATH;  Surgeon: Jules Husbands, MD;  Location: [facility];  Service: General;  Laterality: N/A;  . RADIOLOGY WITH ANESTHESIA N/A 02/20/2018   Procedure: CT WITH ANESTHESIA MICROWAVE THERMAL  ABLATION-LIVER;  Surgeon: Jacqulynn Cadet, MD;  Location: [facility];  Service: Anesthesiology;  Laterality: N/A;  . TOOTH EXTRACTION    . TOTAL SHOULDER ARTHROPLASTY Right 06/03/2015   Procedure: TOTAL SHOULDER ARTHROPLASTY;  Surgeon: Tania Ade, MD;  Location: [facility];  Service: Orthopedics;  Laterality: Right;  Right total shoulder arthroplasty  . TOTAL SHOULDER REPLACEMENT Right 06/03/2015  . TUBAL LIGATION      FAMILY HISTORY: Father with non-Hodgkin's  lymphoma, 2 paternal aunts with breast cancer.     ADVANCED DIRECTIVES:    HEALTH MAINTENANCE: Social History   Tobacco Use  . Smoking status: Never Smoker  . Smokeless tobacco: Never Used  Substance Use Topics  . Alcohol use: Yes    Alcohol/week: 0.0 standard drinks    Comment: occasionally wine  . Drug use: Yes    Types: Marijuana    Comment: cannabis with no extra  low dose edibles  for neuropathy     Colonoscopy:  PAP:  Bone density:  Lipid panel:  Allergies  Allergen Reactions  . Ace Inhibitors Cough  . Latex Itching  . Morphine And Related Itching    Caused her to itch terribly. Would prefer if given to take with a benadryl  . Penicillins Rash    Has patient had a PCN reaction causing immediate rash, facial/tongue/throat swelling, SOB or lightheadedness with hypotension: No Has patient had a PCN reaction causing severe rash involving mucus membranes or skin necrosis: No Has patient had a PCN reaction that required hospitalization No Has patient had a PCN reaction occurring within the last 10 years: No If all of the above answers are "NO", then may proceed with Cephalosporin use.    Current Outpatient Medications  Medication Sig Dispense Refill  . acetaminophen (TYLENOL) 500 MG tablet Take 500 mg by mouth every 6 (six) hours as needed for mild pain or moderate pain.     Marland Kitchen ALPRAZolam (XANAX) 0.25 MG tablet Take 1 tablet (0.25 mg total) by mouth at bedtime. 30 tablet 0  . atorvastatin (LIPITOR) 10 MG tablet Take 10 mg by mouth at bedtime.     Marland Kitchen buPROPion (WELLBUTRIN XL) 150 MG 24 hr tablet Take 150 mg by mouth at bedtime.     . Calcium-Magnesium-Vitamin D (CALCIUM 1200+D3 PO) Take 1 tablet by mouth daily.     . Cyanocobalamin 5000 MCG CAPS Take 5,000 mcg by mouth daily.     . ferrous sulfate 325 (65 FE) MG EC tablet Take 325 mg by mouth daily.     . fexofenadine (ALLEGRA) 180 MG tablet Take 180 mg by mouth at bedtime.     . gabapentin (NEURONTIN) 300 MG capsule  Take 300-600 mg by mouth See admin instructions. Take 300 mg by mouth in the morning and 600 mg at night    . lidocaine-prilocaine (EMLA) cream Apply 1 application topically as needed. Apply to port 1-2 hours prior to chemotherapy appointment. Cover with plastic wrap. 30 g 0  . metFORMIN (GLUCOPHAGE) 850 MG tablet Take 850 mg by mouth 2 (two) times daily with a meal.     . Oxycodone HCl 10 MG TABS Take 1 tablet (10 mg total) by mouth every 4 (four) hours as needed. Ok to take every 4-6 hours as needed. 90 tablet 0  . pantoprazole (PROTONIX) 40 MG tablet Take 40 mg by mouth at bedtime.     Marland Kitchen PARoxetine (PAXIL) 40 MG tablet Take 40 mg by mouth at bedtime.     . prochlorperazine (COMPAZINE) 10  MG tablet Take 1 tablet (10 mg total) by mouth every 6 (six) hours as needed for nausea or vomiting. 30 tablet 3  . sodium chloride (MURO 128) 5 % ophthalmic solution Place 1 drop into both eyes 4 (four) times daily.    Marland Kitchen zolpidem (AMBIEN) 10 MG tablet Take 10 mg by mouth at bedtime as needed (sleep).     . megestrol (MEGACE) 40 MG tablet Take 1 tablet (40 mg total) by mouth daily. (Patient not taking: Reported on 05/01/2018) 30 tablet 2   No current facility-administered medications for this visit.     OBJECTIVE: Vitals:   05/01/18 0944  BP: 122/79  Pulse: (!) 101  Temp: 98.3 F (36.8 C)     Body mass index is 29.18 kg/m.    ECOG FS:3 - Symptomatic, >50% confined to bed  General: Ill-appearing, no acute distress.  Sitting in a wheelchair.   Eyes: Pink conjunctiva, anicteric sclera. HEENT: Normocephalic, moist mucous membranes. Lungs: Clear to auscultation bilaterally. Heart: Regular rate and rhythm. No rubs, murmurs, or gallops. Abdomen: Soft, nontender, nondistended. No organomegaly noted, normoactive bowel sounds. Musculoskeletal: No edema, cyanosis, or clubbing. Neuro: Alert, answering all questions appropriately. Cranial nerves grossly intact. Skin: No rashes or petechiae noted. Psych: Normal  affect.   LAB RESULTS:  Lab Results  Component Value Date   NA 136 05/01/2018   K 4.2 05/01/2018   CL 106 05/01/2018   CO2 25 05/01/2018   GLUCOSE 131 (H) 05/01/2018   BUN 21 05/01/2018   CREATININE 0.91 05/01/2018   CALCIUM 8.0 (L) 05/01/2018   PROT 5.5 (L) 05/01/2018   ALBUMIN 2.8 (L) 05/01/2018   AST 98 (H) 05/01/2018   ALT 45 (H) 05/01/2018   ALKPHOS 263 (H) 05/01/2018   BILITOT 0.7 05/01/2018   GFRNONAA >60 05/01/2018   GFRAA >60 05/01/2018    Lab Results  Component Value Date   WBC 13.5 (H) 05/01/2018   NEUTROABS 10.6 (H) 05/01/2018   HGB 7.5 (L) 05/01/2018   HCT 25.2 (L) 05/01/2018   MCV 97.3 05/01/2018   PLT 163 05/01/2018     STUDIES: No results found.  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  PLAN:    1.  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  Patient wishes to continue with palliative treatment. It appears patient can only receive Halaven every other week.  Proceed with cycle 3, day 1 of Halaven today.  Patient also requires On-Pro Neulasta with each treatment.  Return to clinic in 1 week for laboratory work, further evaluation, and possible blood transfusion.  Patient then return to clinic in 2 weeks for further evaluation and consideration of cycle 3, day 15.  Will likely reimage at the conclusion of cycle 3. 2.  Anemia: Patient hemoglobin improved to 7.5 with transfusion.  Proceed with treatment as above and return to clinic in 1 week for evaluation and consideration of transfusion. 3.  Peripheral neuropathy: Chronic and unchanged.  Continue gabapentin as prescribed. Neuropathy managed by neurology.  4. Back pain: MRI results from May 04, 2017 did not reveal metastatic disease.  Continue symptomatic treatment.  5.   Left IJ clot: Ultrasound results reviewed independently.  Patient has been instructed to discontinue Eliquis. 6.  Neutropenia: Resolved. Secondary to Halaven.  Patient requires Neulasta with each treatment.   7.  Dental complaints: Patient has been instructed if she needs dental intervention, to proceed as scheduled per her dentist.  Any invasive procedures likely will need to be timed several days prior to day 1 of her next cycle. 8.  Memory complaints: MRI of the brain on December 21, 2017 confirmed widespread osseous metastatic disease, but no intracranial abnormality was noted. 9.  Elevated liver enzymes: Chronic and unchanged.  Monitor. 10.  Poor appetite: Patient has tried Remeron and dexamethasone without much effect.  She only took 2 days of Megace prior to discontinuing.  Patient states she will reconsider retrying Megace this week. 11.  Insomnia: Continue Xanax as prescribed.  Patient expressed understanding and was in agreement with this plan. She also understands that She can call clinic at any time with any questions, concerns, or complaints.   Breast cancer   Staging form: Breast, AJCC 7th Edition     Pathologic stage from 08/11/2014: Stage IIIA (T0, N2a, cM0) - Signed by Lloyd Huger, MD on 08/11/2014   Lloyd Huger, MD   05/01/2018 4:31 PM

## 2018-05-02 ENCOUNTER — Telehealth: Payer: Self-pay

## 2018-05-02 NOTE — Telephone Encounter (Signed)
Patient was told that it was ok and to continue with her follow up appointment. Patient had no further questions or concerns.

## 2018-05-02 NOTE — Telephone Encounter (Signed)
Patient called wanting to let you know that yesterday at 7:30 PM when getting ready to lay down to bed, her Onpro came off. She stated that she nor her husband were going to try to put it in. She stated that her last dosage was suppose to be today at 11:30 AM. She stated that she had been feeling good. She wanted to know if it was okay or not and if she could bring the injection next week. Please advise.

## 2018-05-02 NOTE — Telephone Encounter (Signed)
It's ok. Just keep f/u as scheduled.

## 2018-05-07 ENCOUNTER — Other Ambulatory Visit: Payer: Self-pay

## 2018-05-08 ENCOUNTER — Ambulatory Visit: Payer: Medicare Other | Admitting: Occupational Therapy

## 2018-05-08 ENCOUNTER — Inpatient Hospital Stay: Payer: Medicare Other

## 2018-05-08 ENCOUNTER — Ambulatory Visit: Payer: Medicare Other | Admitting: Oncology

## 2018-05-08 ENCOUNTER — Other Ambulatory Visit: Payer: Self-pay

## 2018-05-08 ENCOUNTER — Other Ambulatory Visit: Payer: Self-pay | Admitting: *Deleted

## 2018-05-08 ENCOUNTER — Inpatient Hospital Stay (HOSPITAL_BASED_OUTPATIENT_CLINIC_OR_DEPARTMENT_OTHER): Payer: Medicare Other | Admitting: Oncology

## 2018-05-08 ENCOUNTER — Encounter: Payer: Self-pay | Admitting: Oncology

## 2018-05-08 ENCOUNTER — Ambulatory Visit: Payer: Medicare Other

## 2018-05-08 ENCOUNTER — Other Ambulatory Visit: Payer: Medicare Other

## 2018-05-08 VITALS — BP 125/81 | HR 101 | Temp 95.2°F | Resp 18 | Ht 64.0 in | Wt 165.0 lb

## 2018-05-08 DIAGNOSIS — C50919 Malignant neoplasm of unspecified site of unspecified female breast: Secondary | ICD-10-CM | POA: Diagnosis not present

## 2018-05-08 DIAGNOSIS — C50912 Malignant neoplasm of unspecified site of left female breast: Secondary | ICD-10-CM | POA: Diagnosis not present

## 2018-05-08 DIAGNOSIS — D701 Agranulocytosis secondary to cancer chemotherapy: Secondary | ICD-10-CM | POA: Diagnosis not present

## 2018-05-08 DIAGNOSIS — Z170 Estrogen receptor positive status [ER+]: Secondary | ICD-10-CM

## 2018-05-08 DIAGNOSIS — D649 Anemia, unspecified: Secondary | ICD-10-CM

## 2018-05-08 DIAGNOSIS — D6481 Anemia due to antineoplastic chemotherapy: Secondary | ICD-10-CM | POA: Diagnosis not present

## 2018-05-08 DIAGNOSIS — G620 Drug-induced polyneuropathy: Secondary | ICD-10-CM | POA: Diagnosis not present

## 2018-05-08 DIAGNOSIS — C787 Secondary malignant neoplasm of liver and intrahepatic bile duct: Secondary | ICD-10-CM

## 2018-05-08 DIAGNOSIS — C7951 Secondary malignant neoplasm of bone: Secondary | ICD-10-CM | POA: Diagnosis not present

## 2018-05-08 DIAGNOSIS — Z95828 Presence of other vascular implants and grafts: Secondary | ICD-10-CM

## 2018-05-08 DIAGNOSIS — T451X5A Adverse effect of antineoplastic and immunosuppressive drugs, initial encounter: Secondary | ICD-10-CM

## 2018-05-08 DIAGNOSIS — R110 Nausea: Secondary | ICD-10-CM | POA: Diagnosis not present

## 2018-05-08 DIAGNOSIS — Z5111 Encounter for antineoplastic chemotherapy: Secondary | ICD-10-CM | POA: Diagnosis not present

## 2018-05-08 LAB — CBC WITH DIFFERENTIAL/PLATELET
Abs Immature Granulocytes: 0.09 10*3/uL — ABNORMAL HIGH (ref 0.00–0.07)
Basophils Absolute: 0 10*3/uL (ref 0.0–0.1)
Basophils Relative: 2 %
Eosinophils Absolute: 0 10*3/uL (ref 0.0–0.5)
Eosinophils Relative: 0 %
HCT: 19.8 % — ABNORMAL LOW (ref 36.0–46.0)
Hemoglobin: 5.9 g/dL — ABNORMAL LOW (ref 12.0–15.0)
Immature Granulocytes: 5 %
Lymphocytes Relative: 25 %
Lymphs Abs: 0.5 10*3/uL — ABNORMAL LOW (ref 0.7–4.0)
MCH: 28.8 pg (ref 26.0–34.0)
MCHC: 29.8 g/dL — ABNORMAL LOW (ref 30.0–36.0)
MCV: 96.6 fL (ref 80.0–100.0)
Monocytes Absolute: 0.5 10*3/uL (ref 0.1–1.0)
Monocytes Relative: 25 %
Neutro Abs: 0.9 10*3/uL — ABNORMAL LOW (ref 1.7–7.7)
Neutrophils Relative %: 43 %
Platelets: 150 10*3/uL (ref 150–400)
RBC: 2.05 MIL/uL — ABNORMAL LOW (ref 3.87–5.11)
RDW: 20.5 % — ABNORMAL HIGH (ref 11.5–15.5)
Smear Review: NORMAL
WBC: 1.9 10*3/uL — ABNORMAL LOW (ref 4.0–10.5)
nRBC: 7.8 % — ABNORMAL HIGH (ref 0.0–0.2)

## 2018-05-08 LAB — COMPREHENSIVE METABOLIC PANEL
ALT: 40 U/L (ref 0–44)
AST: 96 U/L — ABNORMAL HIGH (ref 15–41)
Albumin: 2.8 g/dL — ABNORMAL LOW (ref 3.5–5.0)
Alkaline Phosphatase: 189 U/L — ABNORMAL HIGH (ref 38–126)
Anion gap: 6 (ref 5–15)
BUN: 17 mg/dL (ref 8–23)
CO2: 24 mmol/L (ref 22–32)
Calcium: 8.3 mg/dL — ABNORMAL LOW (ref 8.9–10.3)
Chloride: 106 mmol/L (ref 98–111)
Creatinine, Ser: 0.78 mg/dL (ref 0.44–1.00)
GFR calc Af Amer: >60 mL/min (ref >60)
GFR calc non Af Amer: >60 mL/min (ref >60)
Glucose, Bld: 129 mg/dL — ABNORMAL HIGH (ref 70–99)
Potassium: 3.8 mmol/L (ref 3.5–5.1)
Sodium: 136 mmol/L (ref 135–145)
Total Bilirubin: 0.6 mg/dL (ref 0.3–1.2)
Total Protein: 5.4 g/dL — ABNORMAL LOW (ref 6.5–8.1)

## 2018-05-08 LAB — PREPARE RBC (CROSSMATCH)

## 2018-05-08 MED ORDER — ACETAMINOPHEN 325 MG PO TABS
650.0000 mg | ORAL_TABLET | Freq: Once | ORAL | Status: AC
  Administered 2018-05-08: 09:00:00 650 mg via ORAL
  Filled 2018-05-08: qty 2

## 2018-05-08 MED ORDER — PROCHLORPERAZINE MALEATE 10 MG PO TABS: 10.0000 mg | ORAL_TABLET | Freq: Four times a day (QID) | ORAL | 3 refills | Status: DC | PRN

## 2018-05-08 MED ORDER — SODIUM CHLORIDE 0.9% FLUSH
10.0000 mL | Freq: Once | INTRAVENOUS | Status: AC
  Administered 2018-05-08: 10 mL via INTRAVENOUS
  Filled 2018-05-08: qty 10

## 2018-05-08 MED ORDER — SODIUM CHLORIDE 0.9% IV SOLUTION
250.0000 mL | Freq: Once | INTRAVENOUS | Status: AC
  Administered 2018-05-08: 09:00:00 250 mL via INTRAVENOUS
  Filled 2018-05-08: qty 250

## 2018-05-08 MED ORDER — HEPARIN SOD (PORK) LOCK FLUSH 100 UNIT/ML IV SOLN
500.0000 [IU] | Freq: Every day | INTRAVENOUS | Status: AC | PRN
  Administered 2018-05-08: 14:00:00 500 [IU]
  Filled 2018-05-08: qty 5

## 2018-05-08 NOTE — Progress Notes (Signed)
Pt feels about the same today. She is tired and always is according to pt. eatin good, not taking the megace.

## 2018-05-08 NOTE — Progress Notes (Signed)
Hematology/Oncology Consult note [facility]  Telephone:([phone] [phone]  Patient Care Team: Sofie Hartigan, MD as PCP - General (Family Medicine) Jules Husbands, MD as Consulting Physician (General Surgery)   Name of the patient: Amanda [name]  [id number]  Jun 01, 1950   Date of visit: 05/08/18  Diagnosis-metastatic ER positive breast cancer with liver metastases  Chief complaint/ Reason for visit-routine follow-up visit of breast cancer for possible transfusion  Heme/Onc history: Patient is a 68 year old female with a history of ER PR positive HER-2/neu negative metastatic breast cancer.  She sees Dr. Grayland Ormond as an outpatient.  She was noted to have disease progression in February 2020 with progressive liver lesions.  She is currently getting [place] at 1.1 mg/m every other week.  Treatment has been complicated by chemo induced pancytopenia requiring intermittent transfusions.  Interval history-feels fatigued.  Has chronic chemo-induced neuropathy which is stable.  Also has mild nausea which is controlled with Compazine  ECOG PS- 1 Pain scale- 0 Opioid associated constipation- no  Review of systems- Review of Systems  Constitutional: Positive for malaise/fatigue. Negative for chills, fever and weight loss.  HENT: Negative for congestion, ear discharge and nosebleeds.   Eyes: Negative for blurred vision.  Respiratory: Negative for cough, hemoptysis, sputum production, shortness of breath and wheezing.   Cardiovascular: Negative for chest pain, palpitations, orthopnea and claudication.  Gastrointestinal: Positive for nausea. Negative for abdominal pain, blood in stool, constipation, diarrhea, heartburn, melena and vomiting.  Genitourinary: Negative for dysuria, flank pain, frequency, hematuria and urgency.  Musculoskeletal: Negative for back pain, joint pain and myalgias.  Skin: Negative for rash.  Neurological: Positive for sensory  change (Peripheral neuropathy). Negative for dizziness, tingling, focal weakness, seizures, weakness and headaches.  Endo/Heme/Allergies: Does not bruise/bleed easily.  Psychiatric/Behavioral: Negative for depression and suicidal ideas. The patient does not have insomnia.       Allergies  Allergen Reactions  . Ace Inhibitors Cough  . Latex Itching  . Morphine And Related Itching    Caused her to itch terribly. Would prefer if given to take with a benadryl  . Penicillins Rash    Has patient had a PCN reaction causing immediate rash, facial/tongue/throat swelling, SOB or lightheadedness with hypotension: No Has patient had a PCN reaction causing severe rash involving mucus membranes or skin necrosis: No Has patient had a PCN reaction that required hospitalization No Has patient had a PCN reaction occurring within the last 10 years: No If all of the above answers are "NO", then may proceed with Cephalosporin use.     Past Medical History:  Diagnosis Date  . Anxiety   . Back pain    occasionally  . Breast cancer ([place]) 2009   left  . Depression    takes Paxil and Wellbutrin daily  . Diabetes ([place])    takes Metformin daily   type 2   . Genetic testing 02/03/2017   Multi-Cancer panel (83 genes) @ [facility] - No pathogenic mutations detected  . GERD (gastroesophageal reflux disease)    occasional  . History of bronchitis 2 yrs ago  . History of kidney stones   . Hyperlipidemia    takes Atorvastatin daily  . Hypertension    no meds  . Insomnia    takes Ambien nightly  . Insomnia    takes gabapentin nightly  . Joint pain   . Mood swings   . Osteoarthritis of knee   . Personal history of chemotherapy 12/05/2016  Mets from Breast Cancer  . Personal history of radiation therapy 11/2016  . Pneumonia   . PONV (postoperative nausea and vomiting)   . Seasonal allergies    takes Allegra daily     Past Surgical History:  Procedure Laterality Date  . BREAST BIOPSY  2009  .  cataract surgery Bilateral   . COLONOSCOPY    . IR RADIOLOGIST EVAL & MGMT  01/29/2018  . JOINT REPLACEMENT Left 2014   knee  . KNEE ARTHROSCOPY Left    x 5  . MASTECTOMY Left   . port a cath placed    . PORTACATH PLACEMENT N/A 09/17/2015   Procedure: INSERTION PORT-A-CATH;  Surgeon: Jules Husbands, MD;  Location: [facility];  Service: General;  Laterality: N/A;  . RADIOLOGY WITH ANESTHESIA N/A 02/20/2018   Procedure: CT WITH ANESTHESIA MICROWAVE THERMAL ABLATION-LIVER;  Surgeon: Jacqulynn Cadet, MD;  Location: [facility];  Service: Anesthesiology;  Laterality: N/A;  . TOOTH EXTRACTION    . TOTAL SHOULDER ARTHROPLASTY Right 06/03/2015   Procedure: TOTAL SHOULDER ARTHROPLASTY;  Surgeon: Tania Ade, MD;  Location: [place];  Service: Orthopedics;  Laterality: Right;  Right total shoulder arthroplasty  . TOTAL SHOULDER REPLACEMENT Right 06/03/2015  . TUBAL LIGATION      Social History   Socioeconomic History  . Marital status: Married    Spouse name: Not on file  . Number of children: Not on file  . Years of education: Not on file  . Highest education level: Not on file  Occupational History  . Not on file  Social Needs  . Financial resource strain: Patient refused  . Food insecurity:    Worry: Patient refused    Inability: Patient refused  . Transportation needs:    Medical: Patient refused    Non-medical: Patient refused  Tobacco Use  . Smoking status: Never Smoker  . Smokeless tobacco: Never Used  Substance and Sexual Activity  . Alcohol use: Yes    Alcohol/week: 0.0 standard drinks    Comment: occasionally wine  . Drug use: Yes    Types: Marijuana    Comment: cannabis with no extra  low dose edibles  for neuropathy  . Sexual activity: Yes    Birth control/protection: Post-menopausal  Lifestyle  . Physical activity:    Days per week: Patient refused    Minutes per session: Patient refused  . Stress: Patient refused  Relationships  . Social connections:    Talks on  phone: Patient refused    Gets together: Patient refused    Attends religious service: Patient refused    Active member of club or organization: Patient refused    Attends meetings of clubs or organizations: Patient refused    Relationship status: Patient refused  . Intimate partner violence:    Fear of current or ex partner: Patient refused    Emotionally abused: Patient refused    Physically abused: Patient refused    Forced sexual activity: Patient refused  Other Topics Concern  . Not on file  Social History Narrative  . Not on file    Family History  Problem Relation Age of Onset  . Atrial fibrillation Mother        deceased 7; TAH/BSO  . Non-Hodgkin's lymphoma Father        deceased 22  . Heart attack Maternal Uncle   . Melanoma Maternal Uncle   . Breast cancer Paternal Aunt        dx in 8s  . Breast cancer Paternal  Aunt        dx in 30s  . Cancer Brother 90       unk. type  . Breast cancer Other        Mat grandmother's 2 sisters; dx at 82 and 2  . Stomach cancer Paternal Grandfather 26       deceased 27s  . Breast cancer Cousin        daughter of pat aunt with breast ca     Current Outpatient Medications:  .  acetaminophen (TYLENOL) 500 MG tablet, Take 500 mg by mouth every 6 (six) hours as needed for mild pain or moderate pain. , Disp: , Rfl:  .  atorvastatin (LIPITOR) 10 MG tablet, Take 10 mg by mouth at bedtime. , Disp: , Rfl:  .  buPROPion (WELLBUTRIN XL) 150 MG 24 hr tablet, Take 150 mg by mouth at bedtime. , Disp: , Rfl:  .  Calcium-Magnesium-Vitamin D (CALCIUM 1200+D3 PO), Take 1 tablet by mouth daily. , Disp: , Rfl:  .  Cyanocobalamin 5000 MCG CAPS, Take 5,000 mcg by mouth daily. , Disp: , Rfl:  .  ferrous sulfate 325 (65 FE) MG EC tablet, Take 325 mg by mouth daily. , Disp: , Rfl:  .  fexofenadine (ALLEGRA) 180 MG tablet, Take 180 mg by mouth at bedtime. , Disp: , Rfl:  .  gabapentin (NEURONTIN) 300 MG capsule, Take 300-600 mg by mouth 2 (two) times  daily. Take 300 mg by mouth in the morning and 340m at night, Disp: , Rfl:  .  lidocaine-prilocaine (EMLA) cream, Apply 1 application topically as needed. Apply to port 1-2 hours prior to chemotherapy appointment. Cover with plastic wrap., Disp: 30 g, Rfl: 0 .  metFORMIN (GLUCOPHAGE) 850 MG tablet, Take 850 mg by mouth 2 (two) times daily with a meal. , Disp: , Rfl:  .  Oxycodone HCl 10 MG TABS, Take 1 tablet (10 mg total) by mouth every 4 (four) hours as needed. Ok to take every 4-6 hours as needed., Disp: 90 tablet, Rfl: 0 .  pantoprazole (PROTONIX) 40 MG tablet, Take 40 mg by mouth at bedtime. , Disp: , Rfl:  .  PARoxetine (PAXIL) 40 MG tablet, Take 40 mg by mouth at bedtime. , Disp: , Rfl:  .  prochlorperazine (COMPAZINE) 10 MG tablet, Take 1 tablet (10 mg total) by mouth every 6 (six) hours as needed for nausea or vomiting., Disp: 30 tablet, Rfl: 3 .  sodium chloride (MURO 128) 5 % ophthalmic solution, Place 1 drop into both eyes 4 (four) times daily., Disp: , Rfl:  .  zolpidem (AMBIEN) 10 MG tablet, Take 10 mg by mouth at bedtime as needed (sleep). , Disp: , Rfl:  .  ALPRAZolam (XANAX) 0.25 MG tablet, Take 1 tablet (0.25 mg total) by mouth at bedtime. (Patient not taking: Reported on 05/08/2018), Disp: 30 tablet, Rfl: 0 .  megestrol (MEGACE) 40 MG tablet, Take 1 tablet (40 mg total) by mouth daily. (Patient not taking: Reported on 05/01/2018), Disp: 30 tablet, Rfl: 2  Physical exam:  Vitals:   05/08/18 0842  BP: 125/81  Pulse: (!) 101  Resp: 18  Temp: (!) 95.2 F (35.1 C)  TempSrc: Tympanic  Weight: 165 lb (74.8 kg)  Height: 5' 4"  (1.626 m)   Physical Exam Constitutional:      Comments: Appears frail.  Sitting in a wheelchair.  No acute distress  HENT:     Head: Normocephalic and atraumatic.  Eyes:  Pupils: Pupils are equal, round, and reactive to light.  Neck:     Musculoskeletal: Normal range of motion.  Cardiovascular:     Rate and Rhythm: Regular rhythm. Tachycardia  present.     Heart sounds: Normal heart sounds.  Pulmonary:     Effort: Pulmonary effort is normal.     Breath sounds: Normal breath sounds.  Abdominal:     General: Bowel sounds are normal.     Palpations: Abdomen is soft.  Skin:    General: Skin is warm and dry.  Neurological:     Mental Status: She is alert and oriented to person, place, and time.      CMP Latest Ref Rng & Units 05/08/2018  Glucose 70 - 99 mg/dL 129(H)  BUN 8 - 23 mg/dL 17  Creatinine 0.44 - 1.00 mg/dL 0.78  Sodium 135 - 145 mmol/L 136  Potassium 3.5 - 5.1 mmol/L 3.8  Chloride 98 - 111 mmol/L 106  CO2 22 - 32 mmol/L 24  Calcium 8.9 - 10.3 mg/dL 8.3(L)  Total Protein 6.5 - 8.1 g/dL 5.4(L)  Total Bilirubin 0.3 - 1.2 mg/dL 0.6  Alkaline Phos 38 - 126 U/L 189(H)  AST 15 - 41 U/L 96(H)  ALT 0 - 44 U/L 40   CBC Latest Ref Rng & Units 05/08/2018  WBC 4.0 - 10.5 K/uL 1.9(L)  Hemoglobin 12.0 - 15.0 g/dL 5.9(L)  Hematocrit 36.0 - 46.0 % 19.8(L)  Platelets 150 - 400 K/uL 150      Assessment and plan- Patient is a 68 y.o. female  with a history of ER positive metastatic breast cancer with liver metastases currently on palliative eribulin.  1.  Chemo-induced anemia: Patient significantly anemic today with a hemoglobin of 5.9.  She will receive 1 unit of PRBC today.  Due to ongoing blood shortage I will not give her 2 units of PRBC.    2. She will be seen back in 1 week's time with CBC with differential and CMP for possible eribulin and see Dr. Grayland Ormond at that time.  Possible blood transfusion on that day.  3.  Chemo-induced peripheral neuropathy: Stable continue Neurontin.   Visit Diagnosis 1. Symptomatic anemia   2. Malignant neoplasm of left breast in female, estrogen receptor positive, unspecified site of breast ([place])   3. Chemotherapy-induced peripheral neuropathy ([place])   4. Antineoplastic chemotherapy induced anemia      Dr. Randa Evens, MD, MPH [facility] [phone]  05/08/2018 8:42 AM

## 2018-05-09 ENCOUNTER — Other Ambulatory Visit: Payer: Self-pay | Admitting: Oncology

## 2018-05-09 ENCOUNTER — Ambulatory Visit: Payer: Medicare Other | Attending: Oncology | Admitting: Occupational Therapy

## 2018-05-09 ENCOUNTER — Encounter: Payer: Self-pay | Admitting: Oncology

## 2018-05-09 DIAGNOSIS — I972 Postmastectomy lymphedema syndrome: Secondary | ICD-10-CM | POA: Diagnosis not present

## 2018-05-09 LAB — TYPE AND SCREEN
ABO/RH(D): O POS
Antibody Screen: NEGATIVE
Unit division: 0

## 2018-05-09 LAB — BPAM RBC
Blood Product Expiration Date: 202004232359
ISSUE DATE / TIME: 202004221138
Unit Type and Rh: 5100

## 2018-05-09 NOTE — Patient Instructions (Signed)
Cont with her tubigrip soft and E on forearm and upper arm night time  Isotoner glove night time and with her day sleeve

## 2018-05-09 NOTE — Therapy (Signed)
[place] [facility] [address] [place], [address] Phone: [phone]   Fax:  [phone]  Occupational Therapy Treatment  Patient Details  Name: Amanda [name] MRN: [id number] Date of Birth: 03/20/1950 No data recorded  Encounter Date: 05/09/2018  OT End of Session - 05/09/18 1634    Visit Number  11    Number of Visits  13    Date for OT Re-Evaluation  06/06/18    OT Start Time  1314    OT Stop Time  1351    OT Time Calculation (min)  37 min    Activity Tolerance  Patient limited by fatigue    Behavior During Therapy  [facility] for tasks assessed/performed       Past Medical History:  Diagnosis Date  . Anxiety   . Back pain    occasionally  . Breast cancer ([place]) 2009   left  . Depression    takes Paxil and Wellbutrin daily  . Diabetes ([place])    takes Metformin daily   type 2   . Genetic testing 02/03/2017   Multi-Cancer panel (83 genes) @ [facility] - No pathogenic mutations detected  . GERD (gastroesophageal reflux disease)    occasional  . History of bronchitis 2 yrs ago  . History of kidney stones   . Hyperlipidemia    takes Atorvastatin daily  . Hypertension    no meds  . Insomnia    takes Ambien nightly  . Insomnia    takes gabapentin nightly  . Joint pain   . Mood swings   . Osteoarthritis of knee   . Personal history of chemotherapy 12/05/2016   Mets from Breast Cancer  . Personal history of radiation therapy 11/2016  . Pneumonia   . PONV (postoperative nausea and vomiting)   . Seasonal allergies    takes Allegra daily    Past Surgical History:  Procedure Laterality Date  . BREAST BIOPSY  2009  . cataract surgery Bilateral   . COLONOSCOPY    . IR RADIOLOGIST EVAL & MGMT  01/29/2018  . JOINT REPLACEMENT Left 2014   knee  . KNEE ARTHROSCOPY Left    x 5  . MASTECTOMY Left   . port a cath placed    . PORTACATH PLACEMENT N/A 09/17/2015   Procedure: INSERTION PORT-A-CATH;  Surgeon: Jules Husbands, MD;  Location: [facility];  Service: General;  Laterality: N/A;  . RADIOLOGY WITH ANESTHESIA N/A 02/20/2018   Procedure: CT WITH ANESTHESIA MICROWAVE THERMAL ABLATION-LIVER;  Surgeon: Jacqulynn Cadet, MD;  Location: [facility];  Service: Anesthesiology;  Laterality: N/A;  . TOOTH EXTRACTION    . TOTAL SHOULDER ARTHROPLASTY Right 06/03/2015   Procedure: TOTAL SHOULDER ARTHROPLASTY;  Surgeon: Tania Ade, MD;  Location: [facility] [place];  Service: Orthopedics;  Laterality: Right;  Right total shoulder arthroplasty  . TOTAL SHOULDER REPLACEMENT Right 06/03/2015  . TUBAL LIGATION      There were no vitals filed for this visit.  Subjective Assessment - 05/09/18 1630    Subjective   MY hand started swelling about 2 wks ago - when I called you last week - I feel so bad - no energy - never felt like this before - they  gave me some blood yesterday - lost weight - not eating really good     Patient Stated Goals  Patient reports she wants to control the swelling in her left arm, be able to take care of herself.  Currently in Pain?  No/denies          LYMPHEDEMA/ONCOLOGY QUESTIONNAIRE - 05/09/18 1644      Left Upper Extremity Lymphedema   15 cm Proximal to Olecranon Process  35 cm    10 cm Proximal to Olecranon Process  35 cm    Olecranon Process  26.4 cm    15 cm Proximal to Ulnar Styloid Process  27 cm    10 cm Proximal to Ulnar Styloid Process  23.6 cm    Just Proximal to Ulnar Styloid Process  17.5 cm    Across Hand at [employer]  21 cm    At [place] of 2nd Digit  7 cm    At [facility] of Thumb  7 cm       Pt had cat scratches to L UE - called this OT last week about increase swelling in her L lymphedema hand Pt measurements increase in hand to forearm Pt ed on avoiding cat scratches  Her daytime compression sleeve if more than 10 months old - and hand was before not involve - she had gauntlet -  She wear at night  time tubigrip but  is stretch out  Pt fitted with isotoner glove and soft  tubigrip hand to elbow and E size for wrist to upper arm  Use velcro at top to prevent rolling  Pt to get prescription from MD for new sleeve and glove   call me if she need small isotoner glove or need to see me prior to new sleeve coming in                OT Education - 05/09/18 1632    Education provided  Yes    Education Details  Need to  replace her daysleeve and glove - and wear night time compression - do not let her cat scratch that L arm     Person(s) Educated  Patient    Methods  Explanation;Demonstration    Comprehension  Verbalized understanding;Returned demonstration          OT Long Term Goals - 05/09/18 1642      OT LONG TERM GOAL #1   Title  Patient will be independent in wear, care and schedule of compression garments.     Baseline  Her daytime sleeve is 16 months old -and hand is swollen - need glove - started chemo again - metastasis to liver     Time  4    Period  Weeks    Status  On-going    Target Date  06/06/18            Plan - 05/09/18 1636    Clinical Impression Statement  Pt present at OT after calling about her L lymphedema arm -her hand is more swollen the last 2-3 wks - pt measurements this date increase in hand to forearm - elbow and upper arm decrease - pt sleeve is about 77 months old and need to be replace every 6-9 motnhs - also she did not need glove in past - but appear her cat scratch her - she had several scratch marks on L forearm and hand - Pt ed on avoiding that -and get prescription from MD for new daytime compression sleeve andglove - Bella strong - and proivded her with isotoner glove for day and night time and tubigrip E and soft for hand to upper arm for night time use - pt to call me to check on sleeve when  it comes in - or if she needs a smaller isotoner glove in the next week or so     OT Occupational Profile and History  Comprehensive Assessment- Review of records and extensive additional review of physical,  cognitive, psychosocial history related to current functional performance    Occupational performance deficits (Please refer to evaluation for details):  ADL's;IADL's;Rest and Sleep    Rehab Potential  Good    Clinical Decision Making  Limited treatment options, no task modification necessary    OT Frequency  Biweekly    OT Duration  4 weeks    OT Treatment/Interventions  Self-care/ADL training;Patient/family education;Manual Therapy;Manual lymph drainage;Therapeutic exercise    Plan  Pt to call if need to be seen next week or 2 - and whe new compression comes in     [place]  see pt instructions     Consulted and Agree with Plan of Care  Patient       Patient will benefit from skilled therapeutic intervention in order to improve the following deficits and impairments:     Visit Diagnosis: Postmastectomy lymphedema    Problem List Patient Active Problem List   Diagnosis Date Noted  . Dizziness 03/03/2018  . CAP (community acquired pneumonia) 11/03/2017  . Acute deep vein thrombosis (DVT) of axillary vein (H[facility]) 04/05/2017  . Syncope 04/05/2017  . SOB (shortness of breath) 04/03/2017  . Neuropathy due to chemotherapeutic drug ([place]) 03/19/2017  . Genetic testing 02/03/2017  . Contusion of knee 12/06/2016  . Dislocation of shoulder joint 12/06/2016  . Shoulder joint pain 12/06/2016  . Localized, primary osteoarthritis 12/06/2016  . Osteoarthritis of knee 12/06/2016  . Burning sensation 11/13/2016  . Numbness and tingling 11/13/2016  . Malignant neoplasm of left breast in female, estrogen receptor positive ([place]) 10/05/2016  . Carcinoma of left breast, stage 4 ([place]) 04/06/2016  . Chemotherapy-induced neuropathy ([place]) 03/07/2016  . Metastatic breast cancer ([place]) 09/24/2015  . 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 Personal history of breast cancer 08/23/2015  . 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 Degenerative arthritis of right  shoulder region 01/13/2015  . Cancer of left female breast  ([place]) 07/27/2014  . 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 Rotator cuff tear 06/10/2014  . Allergic rhinitis, seasonal 09/16/2013  . Depression 08/10/2013  . Diabetes mellitus type 2, uncomplicated ([place]) 74/25/9563  . Hyperlipidemia, unspecified 08/10/2013  . BP (high blood pressure) 08/10/2013  . 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 [place], [address] Phone: [phone]   Fax:  [phone]  Name: Amanda [name] MRN: [id number] Date of Birth: April 30, 1950

## 2018-05-13 NOTE — Progress Notes (Signed)
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  INTERVAL HISTORY: Patient returns to clinic today for further evaluation and consideration of her next infusion of Halaven.  She continues to have chronic weakness and fatigue, but this is mildly improved after receiving 1 unit of packed red blood cells last week.  She continues to have a poor performance status and decreased appetite. Her insomnia has improved.  She has a chronic peripheral neuropathy which is slightly improved, but no other neurologic complaints.  She denies any recent fevers or illnesses.  She denies any chest pain, shortness of breath, cough, or hemoptysis. She denies any nausea, vomiting, constipation, or diarrhea. She has no urinary complaints.  Patient offers no further specific complaints today.  REVIEW OF SYSTEMS:   Review of Systems  Constitutional: Positive for malaise/fatigue and weight loss. Negative for fever.  Eyes: Negative.  Negative for blurred vision, double vision and pain.  Respiratory: Negative.  Negative for cough and shortness of breath.   Cardiovascular: Negative.  Negative for chest pain and leg swelling.  Gastrointestinal: Negative.  Negative for abdominal pain.  Genitourinary: Negative.  Negative for dysuria and flank pain.  Musculoskeletal: Negative for back pain and falls.  Skin: Negative.  Negative for rash.  Neurological: Positive for tingling, sensory change and weakness. Negative for focal weakness and headaches.  Endo/Heme/Allergies: Negative.   Psychiatric/Behavioral: Negative for memory loss. The patient has insomnia. The patient is not nervous/anxious.     As per HPI. Otherwise, a complete review of systems is negative.  PAST MEDICAL HISTORY: Past Medical History:  Diagnosis Date  . Anxiety   . Back pain    occasionally  . Breast cancer ([place]) 2009   left  . Depression    takes Paxil and Wellbutrin daily  . Diabetes ([place])    takes Metformin daily   type 2   . Genetic testing 02/03/2017   Multi-Cancer panel (83 genes) @ [facility] - No pathogenic mutations detected  . GERD (gastroesophageal reflux disease)    occasional  . History of bronchitis 2 yrs ago  . History of kidney stones   . Hyperlipidemia    takes Atorvastatin daily  . Hypertension    no meds  . Insomnia    takes Ambien nightly  . Insomnia    takes gabapentin nightly  . Joint pain   . Mood swings   . Osteoarthritis of knee   . Personal history of chemotherapy 12/05/2016   Mets from Breast Cancer  . Personal history of radiation therapy 11/2016  . Pneumonia   . PONV (postoperative nausea and vomiting)   . Seasonal allergies    takes Allegra daily    PAST SURGICAL HISTORY: Past Surgical History:  Procedure Laterality Date  . BREAST BIOPSY  2009  . cataract surgery Bilateral   . COLONOSCOPY    . IR RADIOLOGIST EVAL & MGMT  01/29/2018  . JOINT REPLACEMENT Left 2014   knee  . KNEE ARTHROSCOPY Left    x 5  . MASTECTOMY Left   . port a cath placed    . PORTACATH PLACEMENT N/A 09/17/2015   Procedure: INSERTION PORT-A-CATH;  Surgeon: Jules Husbands, MD;  Location: [facility]S;  Service: General;  Laterality: N/A;  . RADIOLOGY WITH ANESTHESIA N/A 02/20/2018   Procedure: CT WITH ANESTHESIA MICROWAVE THERMAL ABLATION-LIVER;  Surgeon: Jacqulynn Cadet, MD;  Location: [facility];  Service: Anesthesiology;  Laterality: N/A;  . TOOTH EXTRACTION    . TOTAL SHOULDER ARTHROPLASTY Right 06/03/2015   Procedure: TOTAL SHOULDER ARTHROPLASTY;  Surgeon: Tania Ade, MD;  Location: [place];  Service: Orthopedics;  Laterality: Right;  Right total shoulder arthroplasty  . TOTAL SHOULDER  REPLACEMENT Right 06/03/2015  . TUBAL LIGATION      FAMILY HISTORY: Father with non-Hodgkin's lymphoma, 2 paternal aunts with breast cancer.     ADVANCED DIRECTIVES:    HEALTH MAINTENANCE: Social History   Tobacco Use  . Smoking status: Never Smoker  . Smokeless tobacco: Never Used  Substance Use Topics  . Alcohol use: Yes    Alcohol/week: 0.0 standard drinks    Comment: occasionally wine  . Drug use: Yes    Types: Marijuana    Comment: cannabis with no extra  low dose edibles  for neuropathy     Colonoscopy:  PAP:  Bone density:  Lipid panel:  Allergies  Allergen Reactions  . Ace Inhibitors Cough  . Latex Itching  . Morphine And Related Itching    Caused her to itch terribly. Would prefer if given to take with a benadryl  . Penicillins Rash    Has patient had a PCN reaction causing immediate rash, facial/tongue/throat swelling, SOB or lightheadedness with hypotension: No Has patient had a PCN reaction causing severe rash involving mucus membranes or skin necrosis: No Has patient had a PCN reaction that required hospitalization No Has patient had a PCN reaction occurring within the last 10 years: No If all of the above answers are "NO", then may proceed with Cephalosporin use.    Current Outpatient Medications  Medication Sig Dispense Refill  . acetaminophen (TYLENOL) 500 MG tablet Take 500 mg by mouth every 6 (six) hours as needed for mild pain or moderate pain.     Marland Kitchen ALPRAZolam (XANAX) 0.25 MG tablet Take 1 tablet (0.25 mg total) by mouth at bedtime. 30 tablet 0  . atorvastatin (LIPITOR) 10 MG tablet Take 10 mg by mouth at bedtime.     Marland Kitchen buPROPion (WELLBUTRIN XL) 150 MG 24 hr tablet Take 150 mg by mouth at bedtime.     . Calcium-Magnesium-Vitamin D (CALCIUM 1200+D3 PO) Take 1 tablet by mouth daily.     . Cyanocobalamin 5000 MCG CAPS Take 5,000 mcg by mouth daily.     . ferrous sulfate 325 (65 FE) MG EC tablet Take 325 mg by mouth daily.     . fexofenadine  (ALLEGRA) 180 MG tablet Take 180 mg by mouth at bedtime.     . gabapentin (NEURONTIN) 300 MG capsule Take 300-600 mg by mouth 2 (two) times daily. Take 300 mg by mouth in the morning and '300mg'$  at night    . lidocaine-prilocaine (EMLA) cream Apply 1 application topically as needed. Apply to port 1-2 hours prior to chemotherapy appointment. Cover with plastic wrap. 30 g 0  . megestrol (MEGACE) 40 MG tablet Take 1 tablet (40 mg total) by mouth daily. 30 tablet 2  . metFORMIN (GLUCOPHAGE) 850 MG tablet Take 850 mg by mouth 2 (two) times daily with a meal.     . Oxycodone HCl 10 MG TABS Take 1 tablet (10 mg total) by mouth every 4 (four) hours as needed. Ok to take every 4-6 hours as needed. 90 tablet 0  .  pantoprazole (PROTONIX) 40 MG tablet Take 40 mg by mouth at bedtime.     Marland Kitchen PARoxetine (PAXIL) 40 MG tablet Take 40 mg by mouth at bedtime.     . prochlorperazine (COMPAZINE) 10 MG tablet Take 1 tablet (10 mg total) by mouth every 6 (six) hours as needed for nausea or vomiting. 30 tablet 3  . sodium chloride (MURO 128) 5 % ophthalmic solution Place 1 drop into both eyes 4 (four) times daily.    Marland Kitchen zolpidem (AMBIEN) 10 MG tablet Take 10 mg by mouth at bedtime as needed (sleep).      No current facility-administered medications for this visit.    Facility-Administered Medications Ordered in Other Visits  Medication Dose Route Frequency Provider Last Rate Last Dose  . 0.9 %  sodium chloride infusion (Manually program via Guardrails IV Fluids)  250 mL Intravenous Once Lloyd Huger, MD      . eriBULin mesylate (HALAVEN) 2.15 mg in sodium chloride 0.9 % 100 mL chemo infusion  1.1 mg/m2 (Treatment Plan Recorded) Intravenous Once Lloyd Huger, MD 626 mL/hr at 05/15/18 1127 2.15 mg at 05/15/18 1127  . heparin lock flush 100 unit/mL  500 Units Intracatheter Once PRN Lloyd Huger, MD      . pegfilgrastim (NEULASTA ONPRO KIT) injection 6 mg  6 mg Subcutaneous Once Lloyd Huger, MD         OBJECTIVE: Vitals:   05/15/18 0945  BP: 129/78  Pulse: 98  Temp: 98.7 F (37.1 C)  SpO2: 97%     Body mass index is 28.32 kg/m.    ECOG FS:3 - Symptomatic, >50% confined to bed  General: Thin, ill-appearing, no acute distress.  Sitting in a wheelchair. Eyes: Pink conjunctiva, anicteric sclera. HEENT: Normocephalic, moist mucous membranes. Lungs: Clear to auscultation bilaterally. Heart: Regular rate and rhythm. No rubs, murmurs, or gallops. Abdomen: Soft, nontender, nondistended. No organomegaly noted, normoactive bowel sounds. Musculoskeletal: No edema, cyanosis, or clubbing. Neuro: Alert, answering all questions appropriately. Cranial nerves grossly intact. Skin: No rashes or petechiae noted. Psych: Normal affect.  LAB RESULTS:  Lab Results  Component Value Date   NA 136 05/15/2018   K 4.0 05/15/2018   CL 106 05/15/2018   CO2 23 05/15/2018   GLUCOSE 161 (H) 05/15/2018   BUN 18 05/15/2018   CREATININE 0.82 05/15/2018   CALCIUM 8.0 (L) 05/15/2018   PROT 5.3 (L) 05/15/2018   ALBUMIN 2.7 (L) 05/15/2018   AST 68 (H) 05/15/2018   ALT 34 05/15/2018   ALKPHOS 143 (H) 05/15/2018   BILITOT 0.6 05/15/2018   GFRNONAA >60 05/15/2018   GFRAA >60 05/15/2018    Lab Results  Component Value Date   WBC 6.7 05/15/2018   NEUTROABS 5.1 05/15/2018   HGB 6.8 (L) 05/15/2018   HCT 22.5 (L) 05/15/2018   MCV 96.2 05/15/2018   PLT 251 05/15/2018     STUDIES: No results found.  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  PLAN:    1.  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  Patient wishes to continue with palliative treatment. It appears patient can only receive Halaven every other week.  Proceed with cycle 3, day 15 of Halaven today. Patient  also requires On-Pro Neulasta with each treatment.  Return to clinic in 1 week for laboratory work, further  evaluation, and possible blood transfusion.  We will also reimage prior to next appointment.  2.  Anemia: Patient's hemoglobin is mildly improved to 6.8 with transfusion last week.  Proceed with treatment as above and patient will receive an additional 1 unit of packed red blood cells today.  3.  Peripheral neuropathy: Patient reports mild improvement and is now down to gabapentin twice per day. 4. Back pain: MRI results from May 04, 2017 did not reveal metastatic disease.  Continue symptomatic treatment.  5.  Left IJ clot: Ultrasound results reviewed independently.  Patient has been instructed to discontinue Eliquis. 6.  Neutropenia: Resolved. Secondary to Halaven.  Patient requires Neulasta with each treatment.   7.  Dental complaints: Patient has been instructed if she needs dental intervention, to proceed as scheduled per her dentist.  Any invasive procedures likely will need to be timed several days prior to day 1 of her next cycle. 8.  Memory complaints: Patient does not complain of this today.  MRI of the brain on December 21, 2017 confirmed widespread osseous metastatic disease, but no intracranial abnormality was noted. 9.  Elevated liver enzymes: AST remains mildly elevated.  Monitor. 10.  Poor appetite: Patient has tried Remeron and dexamethasone without much effect.  She only took 2 days of Megace prior to discontinuing.  Patient does not wish to retry Megace at this time. 11.  Insomnia: Improved.  Continue Xanax as prescribed.  Patient expressed understanding and was in agreement with this plan. She also understands that She can call clinic at any time with any questions, concerns, or complaints.   Breast cancer   Staging form: Breast, AJCC 7th Edition     Pathologic stage from 08/11/2014: Stage IIIA (T0, N2a, cM0) - Signed by Lloyd Huger, MD on 08/11/2014   Lloyd Huger, MD   05/15/2018 11:31 AM

## 2018-05-14 ENCOUNTER — Other Ambulatory Visit: Payer: Self-pay

## 2018-05-15 ENCOUNTER — Other Ambulatory Visit: Payer: Self-pay

## 2018-05-15 ENCOUNTER — Encounter: Payer: Self-pay | Admitting: Oncology

## 2018-05-15 ENCOUNTER — Inpatient Hospital Stay: Payer: Medicare Other

## 2018-05-15 ENCOUNTER — Inpatient Hospital Stay (HOSPITAL_BASED_OUTPATIENT_CLINIC_OR_DEPARTMENT_OTHER): Payer: Medicare Other | Admitting: Hospice and Palliative Medicine

## 2018-05-15 ENCOUNTER — Inpatient Hospital Stay (HOSPITAL_BASED_OUTPATIENT_CLINIC_OR_DEPARTMENT_OTHER): Payer: Medicare Other | Admitting: Oncology

## 2018-05-15 VITALS — BP 129/78 | HR 98 | Temp 98.7°F | Ht 64.0 in

## 2018-05-15 VITALS — BP 132/80 | HR 90 | Temp 98.5°F | Resp 18

## 2018-05-15 DIAGNOSIS — G4700 Insomnia, unspecified: Secondary | ICD-10-CM

## 2018-05-15 DIAGNOSIS — C50919 Malignant neoplasm of unspecified site of unspecified female breast: Secondary | ICD-10-CM

## 2018-05-15 DIAGNOSIS — R630 Anorexia: Secondary | ICD-10-CM

## 2018-05-15 DIAGNOSIS — C787 Secondary malignant neoplasm of liver and intrahepatic bile duct: Secondary | ICD-10-CM

## 2018-05-15 DIAGNOSIS — Z515 Encounter for palliative care: Secondary | ICD-10-CM | POA: Diagnosis not present

## 2018-05-15 DIAGNOSIS — I82C12 Acute embolism and thrombosis of left internal jugular vein: Secondary | ICD-10-CM | POA: Diagnosis not present

## 2018-05-15 DIAGNOSIS — D649 Anemia, unspecified: Secondary | ICD-10-CM

## 2018-05-15 DIAGNOSIS — Z5111 Encounter for antineoplastic chemotherapy: Secondary | ICD-10-CM | POA: Diagnosis not present

## 2018-05-15 DIAGNOSIS — R5382 Chronic fatigue, unspecified: Secondary | ICD-10-CM

## 2018-05-15 DIAGNOSIS — R531 Weakness: Secondary | ICD-10-CM | POA: Diagnosis not present

## 2018-05-15 DIAGNOSIS — D701 Agranulocytosis secondary to cancer chemotherapy: Secondary | ICD-10-CM | POA: Diagnosis not present

## 2018-05-15 DIAGNOSIS — R945 Abnormal results of liver function studies: Secondary | ICD-10-CM | POA: Diagnosis not present

## 2018-05-15 DIAGNOSIS — C7951 Secondary malignant neoplasm of bone: Secondary | ICD-10-CM

## 2018-05-15 DIAGNOSIS — G620 Drug-induced polyneuropathy: Secondary | ICD-10-CM

## 2018-05-15 DIAGNOSIS — D6481 Anemia due to antineoplastic chemotherapy: Secondary | ICD-10-CM | POA: Diagnosis not present

## 2018-05-15 DIAGNOSIS — Z95828 Presence of other vascular implants and grafts: Secondary | ICD-10-CM

## 2018-05-15 DIAGNOSIS — C50912 Malignant neoplasm of unspecified site of left female breast: Secondary | ICD-10-CM

## 2018-05-15 DIAGNOSIS — Z170 Estrogen receptor positive status [ER+]: Secondary | ICD-10-CM

## 2018-05-15 LAB — CBC WITH DIFFERENTIAL/PLATELET
Abs Immature Granulocytes: 0.39 10*3/uL — ABNORMAL HIGH (ref 0.00–0.07)
Basophils Absolute: 0 10*3/uL (ref 0.0–0.1)
Basophils Relative: 0 %
Eosinophils Absolute: 0 10*3/uL (ref 0.0–0.5)
Eosinophils Relative: 0 %
HCT: 22.5 % — ABNORMAL LOW (ref 36.0–46.0)
Hemoglobin: 6.8 g/dL — ABNORMAL LOW (ref 12.0–15.0)
Immature Granulocytes: 6 %
Lymphocytes Relative: 6 %
Lymphs Abs: 0.4 10*3/uL — ABNORMAL LOW (ref 0.7–4.0)
MCH: 29.1 pg (ref 26.0–34.0)
MCHC: 30.2 g/dL (ref 30.0–36.0)
MCV: 96.2 fL (ref 80.0–100.0)
Monocytes Absolute: 0.8 10*3/uL (ref 0.1–1.0)
Monocytes Relative: 12 %
Neutro Abs: 5.1 10*3/uL (ref 1.7–7.7)
Neutrophils Relative %: 76 %
Platelets: 251 10*3/uL (ref 150–400)
RBC: 2.34 MIL/uL — ABNORMAL LOW (ref 3.87–5.11)
RDW: 20.8 % — ABNORMAL HIGH (ref 11.5–15.5)
WBC: 6.7 10*3/uL (ref 4.0–10.5)
nRBC: 0.7 % — ABNORMAL HIGH (ref 0.0–0.2)

## 2018-05-15 LAB — COMPREHENSIVE METABOLIC PANEL
ALT: 34 U/L (ref 0–44)
AST: 68 U/L — ABNORMAL HIGH (ref 15–41)
Albumin: 2.7 g/dL — ABNORMAL LOW (ref 3.5–5.0)
Alkaline Phosphatase: 143 U/L — ABNORMAL HIGH (ref 38–126)
Anion gap: 7 (ref 5–15)
BUN: 18 mg/dL (ref 8–23)
CO2: 23 mmol/L (ref 22–32)
Calcium: 8 mg/dL — ABNORMAL LOW (ref 8.9–10.3)
Chloride: 106 mmol/L (ref 98–111)
Creatinine, Ser: 0.82 mg/dL (ref 0.44–1.00)
GFR calc Af Amer: >60 mL/min (ref >60)
GFR calc non Af Amer: >60 mL/min (ref >60)
Glucose, Bld: 161 mg/dL — ABNORMAL HIGH (ref 70–99)
Potassium: 4 mmol/L (ref 3.5–5.1)
Sodium: 136 mmol/L (ref 135–145)
Total Bilirubin: 0.6 mg/dL (ref 0.3–1.2)
Total Protein: 5.3 g/dL — ABNORMAL LOW (ref 6.5–8.1)

## 2018-05-15 LAB — PREPARE RBC (CROSSMATCH)

## 2018-05-15 LAB — SAMPLE TO BLOOD BANK

## 2018-05-15 MED ORDER — SODIUM CHLORIDE 0.9% FLUSH
10.0000 mL | Freq: Once | INTRAVENOUS | Status: AC
  Administered 2018-05-15: 10 mL via INTRAVENOUS
  Filled 2018-05-15: qty 10

## 2018-05-15 MED ORDER — HEPARIN SOD (PORK) LOCK FLUSH 100 UNIT/ML IV SOLN
500.0000 [IU] | Freq: Once | INTRAVENOUS | Status: AC | PRN
  Administered 2018-05-15: 14:00:00 500 [IU]
  Filled 2018-05-15: qty 5

## 2018-05-15 MED ORDER — PROCHLORPERAZINE MALEATE 10 MG PO TABS
10.0000 mg | ORAL_TABLET | Freq: Once | ORAL | Status: AC
  Administered 2018-05-15: 10 mg via ORAL
  Filled 2018-05-15: qty 1

## 2018-05-15 MED ORDER — ZOLEDRONIC ACID 4 MG/100ML IV SOLN
4.0000 mg | Freq: Once | INTRAVENOUS | Status: AC
  Administered 2018-05-15: 4 mg via INTRAVENOUS
  Filled 2018-05-15: qty 100

## 2018-05-15 MED ORDER — SODIUM CHLORIDE 0.9 % IV SOLN
Freq: Once | INTRAVENOUS | Status: AC
  Administered 2018-05-15: 11:00:00 via INTRAVENOUS
  Filled 2018-05-15: qty 250

## 2018-05-15 MED ORDER — PEGFILGRASTIM 6 MG/0.6ML ~~LOC~~ PSKT
6.0000 mg | PREFILLED_SYRINGE | Freq: Once | SUBCUTANEOUS | Status: AC
  Administered 2018-05-15: 6 mg via SUBCUTANEOUS
  Filled 2018-05-15: qty 0.6

## 2018-05-15 MED ORDER — DIPHENHYDRAMINE HCL 50 MG/ML IJ SOLN
25.0000 mg | Freq: Once | INTRAMUSCULAR | Status: AC
  Administered 2018-05-15: 25 mg via INTRAVENOUS
  Filled 2018-05-15: qty 1

## 2018-05-15 MED ORDER — SODIUM CHLORIDE 0.9% IV SOLUTION
250.0000 mL | Freq: Once | INTRAVENOUS | Status: AC
  Administered 2018-05-15: 250 mL via INTRAVENOUS
  Filled 2018-05-15: qty 250

## 2018-05-15 MED ORDER — ACETAMINOPHEN 325 MG PO TABS
650.0000 mg | ORAL_TABLET | Freq: Once | ORAL | Status: AC
  Administered 2018-05-15: 11:00:00 650 mg via ORAL
  Filled 2018-05-15: qty 2

## 2018-05-15 MED ORDER — SODIUM CHLORIDE 0.9 % IV SOLN
1.1000 mg/m2 | Freq: Once | INTRAVENOUS | Status: AC
  Administered 2018-05-15: 2.15 mg via INTRAVENOUS
  Filled 2018-05-15: qty 4.3

## 2018-05-15 NOTE — Progress Notes (Signed)
Patient denied getting her weight today. Patient stated that yesterday she did not have a good day. Patient stated that she was nauseated all day and at night time she vomited. Patient stated that she did not have an appetite yesterday because of how she was feeling. Today patient is feeling better.

## 2018-05-15 NOTE — Progress Notes (Signed)
Ca 8, alb 2.7 Corrected calcium =9

## 2018-05-15 NOTE — Progress Notes (Signed)
Calcium 8.0, Albumin 2.7, Hemoglobin 6.8. Per Dr. Grayland Ormond okay to proceed with Halaven and Zometa. Pt will receive 1 unit PRBCs per Dr. Grayland Ormond.  Pt experiencing restless leg after receiving IV benadryl. Dr. Grayland Ormond aware and per Dr. Grayland Ormond, okay to hold Benadryl for future blood transfusions.

## 2018-05-15 NOTE — Progress Notes (Signed)
[place]  Telephone:([phone] [phone]   Name: Amanda [name] Date: 05/15/2018 MRN: [id number]  DOB: 09/18/1950  Patient Care Team: Sofie Hartigan, MD as PCP - General (Family Medicine) Dahlia Byes, Marjory Lies, MD as Consulting Physician (General Surgery)    REASON FOR CONSULTATION: Palliative Care consult requested for this 68 y.o. 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  She was found to have disease progression on Ibrance and letrozole.  Patient previously had verbalized a desire not to pursue further treatment in the event of disease progression.  However, she is decided to start Brumley.  Palliative care was consulted to help address goals and follow for support.  SOCIAL HISTORY:    Patient lives at home with her husband.  She has a son and stepson who are involved.  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  Her husband owns a "ozone" business.  ADVANCE DIRECTIVES:  Not on file. Has HCPOA and living will at home.   CODE STATUS: DNR  PAST MEDICAL HISTORY: Past Medical History:  Diagnosis Date  . Anxiety   . Back pain    occasionally  . Breast cancer ([place]) 2009   left  . Depression    takes Paxil and Wellbutrin daily  . Diabetes ([place])    takes Metformin daily   type 2   . Genetic testing 02/03/2017   Multi-Cancer panel (83 genes) @ [facility] - No pathogenic mutations detected  . GERD (gastroesophageal reflux disease)    occasional  . History of bronchitis 2 yrs ago  . History of kidney stones   . Hyperlipidemia    takes Atorvastatin daily  . Hypertension    no meds  . Insomnia    takes Ambien nightly  . Insomnia    takes gabapentin nightly  . Joint pain   . Mood swings   . Osteoarthritis of knee   . Personal history of chemotherapy 12/05/2016   Mets from Breast Cancer  . Personal history of radiation therapy 11/2016  .  Pneumonia   . PONV (postoperative nausea and vomiting)   . Seasonal allergies    takes Allegra daily    PAST SURGICAL HISTORY:  Past Surgical History:  Procedure Laterality Date  . BREAST BIOPSY  2009  . cataract surgery Bilateral   . COLONOSCOPY    . IR RADIOLOGIST EVAL & MGMT  01/29/2018  . JOINT REPLACEMENT Left 2014   knee  . KNEE ARTHROSCOPY Left    x 5  . MASTECTOMY Left   . port a cath placed    . PORTACATH PLACEMENT N/A 09/17/2015   Procedure: INSERTION PORT-A-CATH;  Surgeon: Jules Husbands, MD;  Location: [facility];  Service: General;  Laterality: N/A;  . RADIOLOGY WITH ANESTHESIA N/A 02/20/2018   Procedure: CT WITH ANESTHESIA MICROWAVE THERMAL ABLATION-LIVER;  Surgeon: Jacqulynn Cadet, MD;  Location: [facility];  Service: Anesthesiology;  Laterality: N/A;  . TOOTH EXTRACTION    . TOTAL SHOULDER ARTHROPLASTY Right 06/03/2015   Procedure: TOTAL SHOULDER ARTHROPLASTY;  Surgeon: Tania Ade, MD;  Location: [place];  Service: Orthopedics;  Laterality: Right;  Right total shoulder arthroplasty  . TOTAL SHOULDER REPLACEMENT Right 06/03/2015  . TUBAL LIGATION      HEMATOLOGY/ONCOLOGY HISTORY:    Cancer of left female breast  ([place])   07/27/2014 Initial Diagnosis    Cancer of left female breast ([place])  Metastatic breast cancer ([place])   09/24/2015 Initial Diagnosis    Metastatic breast cancer ([place])    02/27/2018 -  Chemotherapy    The patient had pegfilgrastim (NEULASTA) injection 6 mg, 6 mg, Subcutaneous, Once, 1 of 1 cycle Administration: 6 mg (04/19/2018) pegfilgrastim (NEULASTA ONPRO KIT) injection 6 mg, 6 mg, Subcutaneous, Once, 1 of 2 cycles Administration: 6 mg (05/01/2018), 6 mg (05/15/2018) eriBULin mesylate (HALAVEN) 2.75 mg in sodium chloride 0.9 % 100 mL chemo infusion, 1.4 mg/m2 = 2.75 mg, Intravenous,  Once, 3 of 4 cycles Dose modification: 1.1 mg/m2 (original dose 1.4 mg/m2, Cycle 1, Reason: Dose not tolerated) Administration: 2.75 mg (02/27/2018), 2.15 mg (03/20/2018),  2.15 mg (04/04/2018), 2.15 mg (04/17/2018), 2.15 mg (05/01/2018), 2.15 mg (05/15/2018)  for chemotherapy treatment.      ALLERGIES:  is allergic to ace inhibitors; latex; morphine and related; and penicillins.  MEDICATIONS:  Current Outpatient Medications  Medication Sig Dispense Refill  . acetaminophen (TYLENOL) 500 MG tablet Take 500 mg by mouth every 6 (six) hours as needed for mild pain or moderate pain.     Marland Kitchen ALPRAZolam (XANAX) 0.25 MG tablet Take 1 tablet (0.25 mg total) by mouth at bedtime. 30 tablet 0  . atorvastatin (LIPITOR) 10 MG tablet Take 10 mg by mouth at bedtime.     Marland Kitchen buPROPion (WELLBUTRIN XL) 150 MG 24 hr tablet Take 150 mg by mouth at bedtime.     . Calcium-Magnesium-Vitamin D (CALCIUM 1200+D3 PO) Take 1 tablet by mouth daily.     . Cyanocobalamin 5000 MCG CAPS Take 5,000 mcg by mouth daily.     . ferrous sulfate 325 (65 FE) MG EC tablet Take 325 mg by mouth daily.     . fexofenadine (ALLEGRA) 180 MG tablet Take 180 mg by mouth at bedtime.     . gabapentin (NEURONTIN) 300 MG capsule Take 300-600 mg by mouth 2 (two) times daily. Take 300 mg by mouth in the morning and 341m at night    . lidocaine-prilocaine (EMLA) cream Apply 1 application topically as needed. Apply to port 1-2 hours prior to chemotherapy appointment. Cover with plastic wrap. 30 g 0  . megestrol (MEGACE) 40 MG tablet Take 1 tablet (40 mg total) by mouth daily. 30 tablet 2  . metFORMIN (GLUCOPHAGE) 850 MG tablet Take 850 mg by mouth 2 (two) times daily with a meal.     . Oxycodone HCl 10 MG TABS Take 1 tablet (10 mg total) by mouth every 4 (four) hours as needed. Ok to take every 4-6 hours as needed. 90 tablet 0  . pantoprazole (PROTONIX) 40 MG tablet Take 40 mg by mouth at bedtime.     .Marland KitchenPARoxetine (PAXIL) 40 MG tablet Take 40 mg by mouth at bedtime.     . prochlorperazine (COMPAZINE) 10 MG tablet Take 1 tablet (10 mg total) by mouth every 6 (six) hours as needed for nausea or vomiting. 30 tablet 3  . sodium  chloride (MURO 128) 5 % ophthalmic solution Place 1 drop into both eyes 4 (four) times daily.    .Marland Kitchenzolpidem (AMBIEN) 10 MG tablet Take 10 mg by mouth at bedtime as needed (sleep).      No current facility-administered medications for this visit.    Facility-Administered Medications Ordered in Other Visits  Medication Dose Route Frequency Provider Last Rate Last Dose  . heparin lock flush 100 unit/mL  500 Units Intracatheter Once PRN FLloyd Huger MD        VITAL  SIGNS: There were no vitals taken for this visit. There were no vitals filed for this visit.  Estimated body mass index is 28.32 kg/m as calculated from the following:   Height as of an earlier encounter on 05/15/18: 5' 4"  (1.626 m).   Weight as of 05/08/18: 165 lb (74.8 kg).  LABS: CBC:    Component Value Date/Time   WBC 6.7 05/15/2018 0851   HGB 6.8 (L) 05/15/2018 0851   HGB 13.2 12/20/2011 0927   HCT 22.5 (L) 05/15/2018 0851   HCT 40.6 12/20/2011 0927   PLT 251 05/15/2018 0851   PLT 235 12/20/2011 0927   MCV 96.2 05/15/2018 0851   MCV 96 12/20/2011 0927   NEUTROABS 5.1 05/15/2018 0851   NEUTROABS 1.7 12/20/2011 0927   LYMPHSABS 0.4 (L) 05/15/2018 0851   LYMPHSABS 1.4 12/20/2011 0927   MONOABS 0.8 05/15/2018 0851   MONOABS 0.4 12/20/2011 0927   EOSABS 0.0 05/15/2018 0851   EOSABS 0.4 12/20/2011 0927   BASOSABS 0.0 05/15/2018 0851   BASOSABS 0.0 12/20/2011 0927   Comprehensive Metabolic Panel:    Component Value Date/Time   NA 136 05/15/2018 0851   NA 139 12/20/2011 0927   K 4.0 05/15/2018 0851   K 4.1 12/20/2011 0927   CL 106 05/15/2018 0851   CL 102 12/20/2011 0927   CO2 23 05/15/2018 0851   CO2 26 12/20/2011 0927   BUN 18 05/15/2018 0851   BUN 19 (H) 12/20/2011 0927   CREATININE 0.82 05/15/2018 0851   CREATININE 1.15 12/20/2011 0927   GLUCOSE 161 (H) 05/15/2018 0851   GLUCOSE 251 (H) 12/20/2011 0927   CALCIUM 8.0 (L) 05/15/2018 0851   CALCIUM 8.6 12/20/2011 0927   AST 68 (H) 05/15/2018  0851   AST 49 (H) 12/20/2011 0927   ALT 34 05/15/2018 0851   ALT 57 12/20/2011 0927   ALKPHOS 143 (H) 05/15/2018 0851   ALKPHOS 61 12/20/2011 0927   BILITOT 0.6 05/15/2018 0851   BILITOT 0.4 12/20/2011 0927   PROT 5.3 (L) 05/15/2018 0851   PROT 6.8 12/20/2011 0927   ALBUMIN 2.7 (L) 05/15/2018 0851   ALBUMIN 3.6 12/20/2011 0927    RADIOGRAPHIC STUDIES: No results found.  PERFORMANCE STATUS (ECOG) : 1 - Symptomatic but completely ambulatory  Review of Systems As noted above. Otherwise, a complete review of systems is negative.  Physical Exam General: NAD, frail appearing Pulmonary: unlabored Extremities: no edema Skin: no rashes Neurological: Weakness but otherwise nonfocal  IMPRESSION: I met with patient today in the clinic. She was seen while patient was receiving IV blood transfusion.   Patient reports doing reasonably well.  She had some acute nausea yesterday but that has resolved today.  Patient reports insomnia has improved and symptoms are otherwise stable.  Oral intake remains poor.  Weight trending down over April but relatively stable since March.  Patient reports coping well.  We talked about her support system with her husband at home.  We discussed her stressors related to the COVID-19 epidemic and ways to mitigate risk of transmission.  Case and plan discussed with Dr. Grayland Ormond.   PLAN: -Continue current scope of treatment -Continue oxycodone prn for pain -RTC in 1 month   Patient expressed understanding and was in agreement with this plan. She also understands that She can call clinic at any time with any questions, concerns, or complaints.    Time Total: 20 minutes  Visit consisted of counseling and education dealing with the complex and emotionally intense issues of  symptom management and palliative care in the setting of serious and potentially life-threatening illness.Greater than 50%  of this time was spent counseling and coordinating care related  to the above assessment and plan.  Signed by: Altha Harm, PhD, NP-C [phone] (Work Cell)

## 2018-05-16 ENCOUNTER — Inpatient Hospital Stay: Payer: Medicare Other | Admitting: Hospice and Palliative Medicine

## 2018-05-16 LAB — BPAM RBC
Blood Product Expiration Date: 202005132359
ISSUE DATE / TIME: 202004291154
Unit Type and Rh: 5100

## 2018-05-16 LAB — TYPE AND SCREEN
ABO/RH(D): O POS
Antibody Screen: NEGATIVE
Unit division: 0

## 2018-05-19 ENCOUNTER — Other Ambulatory Visit: Payer: Self-pay

## 2018-05-20 ENCOUNTER — Other Ambulatory Visit: Payer: Self-pay | Admitting: *Deleted

## 2018-05-20 ENCOUNTER — Ambulatory Visit
Admission: RE | Admit: 2018-05-20 | Discharge: 2018-05-20 | Disposition: A | Discharge status: Home / Self Care | Payer: Medicare Other | Source: Ambulatory Visit | Attending: Radiation Oncology | Admitting: Radiation Oncology

## 2018-05-20 ENCOUNTER — Other Ambulatory Visit: Payer: Self-pay

## 2018-05-20 ENCOUNTER — Ambulatory Visit
Admission: RE | Admit: 2018-05-20 | Discharge: 2018-05-20 | Disposition: A | Discharge status: Home / Self Care | Payer: Medicare Other | Source: Ambulatory Visit | Attending: Oncology | Admitting: Oncology

## 2018-05-20 ENCOUNTER — Encounter: Payer: Self-pay | Admitting: Radiation Oncology

## 2018-05-20 DIAGNOSIS — C7951 Secondary malignant neoplasm of bone: Secondary | ICD-10-CM | POA: Diagnosis not present

## 2018-05-20 DIAGNOSIS — Z79811 Long term (current) use of aromatase inhibitors: Secondary | ICD-10-CM | POA: Insufficient documentation

## 2018-05-20 DIAGNOSIS — C50919 Malignant neoplasm of unspecified site of unspecified female breast: Secondary | ICD-10-CM | POA: Diagnosis not present

## 2018-05-20 DIAGNOSIS — C787 Secondary malignant neoplasm of liver and intrahepatic bile duct: Secondary | ICD-10-CM | POA: Diagnosis not present

## 2018-05-20 DIAGNOSIS — C50912 Malignant neoplasm of unspecified site of left female breast: Secondary | ICD-10-CM | POA: Insufficient documentation

## 2018-05-20 DIAGNOSIS — T82898A Other specified complication of vascular prosthetic devices, implants and grafts, initial encounter: Secondary | ICD-10-CM | POA: Diagnosis not present

## 2018-05-20 DIAGNOSIS — Z170 Estrogen receptor positive status [ER+]: Secondary | ICD-10-CM | POA: Insufficient documentation

## 2018-05-20 DIAGNOSIS — Z923 Personal history of irradiation: Secondary | ICD-10-CM | POA: Insufficient documentation

## 2018-05-20 DIAGNOSIS — R918 Other nonspecific abnormal finding of lung field: Secondary | ICD-10-CM | POA: Diagnosis not present

## 2018-05-20 MED ORDER — IOHEXOL 300 MG/ML  SOLN
100.0000 mL | Freq: Once | INTRAMUSCULAR | Status: AC | PRN
  Administered 2018-05-20: 12:00:00 85 mL via INTRAVENOUS

## 2018-05-20 NOTE — Progress Notes (Signed)
Radiation Oncology Follow up Note  Name: Amanda [name]   Date:   05/20/2018 MRN:  [id number] DOB: 07/19/1950    This 68 y.o. female presents to the clinic today for 1 month follow-up status post single fraction palliative radiation to her SI joints patient with known stage IV metastatic breast cancer.  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  She has had an excellent palliative benefit to the radiation therapy with no pain reported..  She is currently on Halaven which she is tolerating well.  She had CT scans of chest abdomen pelvis today which I have briefly reviewed although formal report is not been issued.  She is having no other areas of bony pain at this time.  COMPLICATIONS OF TREATMENT: none  FOLLOW UP COMPLIANCE: keeps appointments   PHYSICAL EXAM:  BP (!) (P) 149/85 (BP Location: Left Arm, Patient Position: Sitting)   Pulse (!) (P) 107   Temp (!) (P) 96.7 F (35.9 C) (Tympanic)   Resp (P) 16  Wheelchair-bound frail elderly appearing female in NAD.  Range of motion of her lower extremities did not does not elicit pain.  Motor sensory and DTR levels are equal and symmetric in the lower extremities bilaterally.  Well-developed well-nourished patient in NAD. HEENT reveals PERLA, EOMI, discs not visualized.  Oral cavity is clear. No oral mucosal lesions are identified. Neck is clear without evidence of cervical or supraclavicular adenopathy. Lungs are clear to A&P. Cardiac examination is essentially unremarkable with regular rate and rhythm without murmur rub or thrill. Abdomen is benign with no organomegaly or masses noted. Motor sensory and DTR levels are equal and symmetric in the upper and lower extremities. Cranial nerves II through XII are grossly intact. Proprioception is intact. No peripheral  adenopathy or edema is identified. No motor or sensory levels are noted. Crude visual fields are within normal range.  RADIOLOGY RESULTS: CT scans are briefly reviewed.  PLAN: This time patient continues on Halaven under medical oncology's direction.  I see no further role for palliative radiation therapy this time.  I will turn follow-up care over to medical oncology.  I would be happy to reevaluate the patient anytime should further treatment be indicated.  I would like to take this opportunity to thank you for allowing me to participate in the care of your patient.Noreene Filbert, MD

## 2018-05-22 ENCOUNTER — Inpatient Hospital Stay: Payer: Medicare Other | Attending: Hospice and Palliative Medicine

## 2018-05-22 ENCOUNTER — Inpatient Hospital Stay: Payer: Medicare Other | Attending: Oncology

## 2018-05-22 ENCOUNTER — Inpatient Hospital Stay (HOSPITAL_BASED_OUTPATIENT_CLINIC_OR_DEPARTMENT_OTHER): Payer: Medicare Other | Admitting: Oncology

## 2018-05-22 ENCOUNTER — Encounter: Payer: Self-pay | Admitting: Oncology

## 2018-05-22 ENCOUNTER — Other Ambulatory Visit: Payer: Self-pay | Admitting: Oncology

## 2018-05-22 ENCOUNTER — Other Ambulatory Visit: Payer: Self-pay

## 2018-05-22 VITALS — BP 139/78 | HR 78 | Temp 97.3°F | Ht 64.0 in | Wt 151.0 lb

## 2018-05-22 DIAGNOSIS — Z515 Encounter for palliative care: Secondary | ICD-10-CM | POA: Diagnosis not present

## 2018-05-22 DIAGNOSIS — R945 Abnormal results of liver function studies: Secondary | ICD-10-CM

## 2018-05-22 DIAGNOSIS — Z5111 Encounter for antineoplastic chemotherapy: Secondary | ICD-10-CM | POA: Diagnosis not present

## 2018-05-22 DIAGNOSIS — D649 Anemia, unspecified: Secondary | ICD-10-CM | POA: Diagnosis not present

## 2018-05-22 DIAGNOSIS — G4700 Insomnia, unspecified: Secondary | ICD-10-CM | POA: Diagnosis not present

## 2018-05-22 DIAGNOSIS — C50919 Malignant neoplasm of unspecified site of unspecified female breast: Secondary | ICD-10-CM

## 2018-05-22 DIAGNOSIS — C7951 Secondary malignant neoplasm of bone: Secondary | ICD-10-CM | POA: Diagnosis not present

## 2018-05-22 DIAGNOSIS — C787 Secondary malignant neoplasm of liver and intrahepatic bile duct: Secondary | ICD-10-CM | POA: Insufficient documentation

## 2018-05-22 DIAGNOSIS — R634 Abnormal weight loss: Secondary | ICD-10-CM

## 2018-05-22 DIAGNOSIS — I82C12 Acute embolism and thrombosis of left internal jugular vein: Secondary | ICD-10-CM

## 2018-05-22 DIAGNOSIS — G620 Drug-induced polyneuropathy: Secondary | ICD-10-CM | POA: Diagnosis not present

## 2018-05-22 DIAGNOSIS — D61818 Other pancytopenia: Secondary | ICD-10-CM

## 2018-05-22 DIAGNOSIS — R630 Anorexia: Secondary | ICD-10-CM

## 2018-05-22 DIAGNOSIS — R5383 Other fatigue: Secondary | ICD-10-CM | POA: Diagnosis not present

## 2018-05-22 DIAGNOSIS — R531 Weakness: Secondary | ICD-10-CM

## 2018-05-22 DIAGNOSIS — Z95828 Presence of other vascular implants and grafts: Secondary | ICD-10-CM

## 2018-05-22 LAB — CBC WITH DIFFERENTIAL/PLATELET
Abs Immature Granulocytes: 2.54 10*3/uL — ABNORMAL HIGH (ref 0.00–0.07)
Basophils Absolute: 0.2 10*3/uL — ABNORMAL HIGH (ref 0.0–0.1)
Basophils Relative: 1 %
Eosinophils Absolute: 0 10*3/uL (ref 0.0–0.5)
Eosinophils Relative: 0 %
HCT: 25.4 % — ABNORMAL LOW (ref 36.0–46.0)
Hemoglobin: 7.7 g/dL — ABNORMAL LOW (ref 12.0–15.0)
Immature Granulocytes: 13 %
Lymphocytes Relative: 4 %
Lymphs Abs: 0.9 10*3/uL (ref 0.7–4.0)
MCH: 29.1 pg (ref 26.0–34.0)
MCHC: 30.3 g/dL (ref 30.0–36.0)
MCV: 95.8 fL (ref 80.0–100.0)
Monocytes Absolute: 2.9 10*3/uL — ABNORMAL HIGH (ref 0.1–1.0)
Monocytes Relative: 15 %
Neutro Abs: 12.8 10*3/uL — ABNORMAL HIGH (ref 1.7–7.7)
Neutrophils Relative %: 67 %
Platelets: 187 10*3/uL (ref 150–400)
RBC: 2.65 MIL/uL — ABNORMAL LOW (ref 3.87–5.11)
RDW: 20.1 % — ABNORMAL HIGH (ref 11.5–15.5)
Smear Review: ADEQUATE
WBC: 19.3 10*3/uL — ABNORMAL HIGH (ref 4.0–10.5)
nRBC: 1.9 % — ABNORMAL HIGH (ref 0.0–0.2)

## 2018-05-22 LAB — COMPREHENSIVE METABOLIC PANEL
ALT: 36 U/L (ref 0–44)
AST: 83 U/L — ABNORMAL HIGH (ref 15–41)
Albumin: 2.8 g/dL — ABNORMAL LOW (ref 3.5–5.0)
Alkaline Phosphatase: 233 U/L — ABNORMAL HIGH (ref 38–126)
Anion gap: 9 (ref 5–15)
BUN: 14 mg/dL (ref 8–23)
CO2: 23 mmol/L (ref 22–32)
Calcium: 8.1 mg/dL — ABNORMAL LOW (ref 8.9–10.3)
Chloride: 107 mmol/L (ref 98–111)
Creatinine, Ser: 0.89 mg/dL (ref 0.44–1.00)
GFR calc Af Amer: >60 mL/min (ref >60)
GFR calc non Af Amer: >60 mL/min (ref >60)
Glucose, Bld: 157 mg/dL — ABNORMAL HIGH (ref 70–99)
Potassium: 3.4 mmol/L — ABNORMAL LOW (ref 3.5–5.1)
Sodium: 139 mmol/L (ref 135–145)
Total Bilirubin: 0.8 mg/dL (ref 0.3–1.2)
Total Protein: 5.7 g/dL — ABNORMAL LOW (ref 6.5–8.1)

## 2018-05-22 LAB — SAMPLE TO BLOOD BANK

## 2018-05-22 MED ORDER — SODIUM CHLORIDE 0.9% FLUSH
10.0000 mL | Freq: Once | INTRAVENOUS | Status: AC
  Administered 2018-05-22: 10:00:00 10 mL via INTRAVENOUS
  Filled 2018-05-22: qty 10

## 2018-05-22 MED ORDER — HEPARIN SOD (PORK) LOCK FLUSH 100 UNIT/ML IV SOLN
500.0000 [IU] | Freq: Once | INTRAVENOUS | Status: AC
  Administered 2018-05-22: 500 [IU] via INTRAVENOUS

## 2018-05-22 MED ORDER — LEVOFLOXACIN 500 MG PO TABS: 500.0000 mg | ORAL_TABLET | Freq: Every day | ORAL | 0 refills | Status: DC

## 2018-05-22 NOTE — Addendum Note (Signed)
Addended by: Mallie Snooks I on: 05/22/2018 01:33 PM   Modules accepted: Orders

## 2018-05-22 NOTE — Progress Notes (Signed)
Patient stated that she has no appetite but drinks three Ensures a day and a smoothie once a day. Patient had lost 14 lbs since 05/08/2018. Patient would like to have a consult with the nutritionist. Patient stated that she had been sleeping better.

## 2018-05-22 NOTE — Progress Notes (Signed)
DISCONTINUE ON PATHWAY REGIMEN - Breast     A cycle is every 21 days:     Eribulin mesylate   **Always confirm dose/schedule in your pharmacy ordering system**  REASON: Disease Progression PRIOR TREATMENT: [id number]: Eribulin 1.4 mg/m2 D1, 8 q21 Days Until Progression or Unacceptable Toxicity TREATMENT RESPONSE: Progressive Disease (PD)  START ON PATHWAY REGIMEN - Breast     A cycle is every 28 days (3 weeks on and 1 week off):     Gemcitabine   **Always confirm dose/schedule in your pharmacy ordering system**  Patient Characteristics: Distant Metastases or Locoregional Recurrent Disease - Unresected or Locally Advanced Unresectable Disease Progressing after Neoadjuvant and Local Therapies, HER2 Negative/Unknown/Equivocal, ER Positive, Chemotherapy, Third Line and Beyond, Prior or  Contraindicated Anthracycline and Prior Eribulin Therapeutic Status: Distant Metastases BRCA Mutation Status: Absent ER Status: Positive (+) HER2 Status: Negative (-) PR Status: Positive (+) Line of Therapy: Third Line and Beyond Intent of Therapy: Non-Curative / Palliative Intent, Discussed with Patient

## 2018-05-22 NOTE — Progress Notes (Signed)
[facility]  [phone]) [phone] Fax:(336) [phone]  ID: Amanda [name] OB: 04-22-1950  MR#: [id number]  QZR#:[id number]  Patient Care Team: Sofie Hartigan, MD as PCP - General (Family Medicine) Dahlia Byes, Marjory Lies, MD as Consulting Physician (General Surgery)  CHIEF COMPLAINT: Progressive ER/PR positive, 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  INTERVAL HISTORY: Patient returns to clinic today for further evaluation, discussion of her imaging results, and treatment planning.  She continues to have a poor appetite, progressive weight loss, and declining performance status.  She continues to have increased weakness and fatigue. Her insomnia has improved.  She has a chronic peripheral neuropathy which is slightly improved, but no other neurologic complaints.  She denies any recent fevers or illnesses.  She denies any chest pain, shortness of breath, cough, or hemoptysis. She denies any nausea, vomiting, constipation, or diarrhea. She has no urinary complaints.  Patient feels generally terrible, but offers no further specific complaints.  REVIEW OF SYSTEMS:   Review of Systems  Constitutional: Positive for malaise/fatigue and weight loss. Negative for fever.  Eyes: Negative.  Negative for blurred vision, double vision and pain.  Respiratory: Negative.  Negative for cough and shortness of breath.   Cardiovascular: Negative.  Negative for chest pain and leg swelling.  Gastrointestinal: Negative.  Negative for abdominal pain.  Genitourinary: Negative.  Negative for dysuria and flank pain.  Musculoskeletal: Negative for back pain and falls.  Skin: Negative.  Negative for rash.  Neurological: Positive for tingling, sensory change and weakness. Negative for focal weakness and headaches.  Endo/Heme/Allergies: Negative.   Psychiatric/Behavioral: Negative.  Negative for memory loss. The patient is not nervous/anxious and does not have insomnia.     As per HPI.  Otherwise, a complete review of systems is negative.  PAST MEDICAL HISTORY: Past Medical History:  Diagnosis Date   Anxiety    Back pain    occasionally   Breast cancer ([place]) 2009   left   Depression    takes Paxil and Wellbutrin daily   Diabetes ([place])    takes Metformin daily   type 2    Genetic testing 02/03/2017   Multi-Cancer panel (83 genes) @ [facility] - No pathogenic mutations detected   GERD (gastroesophageal reflux disease)    occasional   History of bronchitis 2 yrs ago   History of kidney stones    Hyperlipidemia    takes Atorvastatin daily   Hypertension    no meds   Insomnia    takes Ambien nightly   Insomnia    takes gabapentin nightly   Joint pain    Mood swings    Osteoarthritis of knee    Personal history of chemotherapy 12/05/2016   Mets from Breast Cancer   Personal history of radiation therapy 11/2016   Pneumonia    PONV (postoperative nausea and vomiting)    Seasonal allergies    takes Allegra daily    PAST SURGICAL HISTORY: Past Surgical History:  Procedure Laterality Date   BREAST BIOPSY  2009   cataract surgery Bilateral    COLONOSCOPY     IR RADIOLOGIST EVAL & MGMT  01/29/2018   JOINT REPLACEMENT Left 2014   knee   KNEE ARTHROSCOPY Left    x 5   MASTECTOMY Left    port a cath placed     PORTACATH PLACEMENT N/A 09/17/2015   Procedure: INSERTION PORT-A-CATH;  Surgeon: Jules Husbands, MD;  Location: [facility];  Service: General;  Laterality: N/A;  RADIOLOGY WITH ANESTHESIA N/A 02/20/2018   Procedure: CT WITH ANESTHESIA MICROWAVE THERMAL ABLATION-LIVER;  Surgeon: Jacqulynn Cadet, MD;  Location: [facility];  Service: Anesthesiology;  Laterality: N/A;   TOOTH EXTRACTION     TOTAL SHOULDER ARTHROPLASTY Right 06/03/2015   Procedure: TOTAL SHOULDER ARTHROPLASTY;  Surgeon: Tania Ade, MD;  Location: [place];  Service: Orthopedics;  Laterality: Right;  Right total shoulder arthroplasty   TOTAL SHOULDER REPLACEMENT  Right 06/03/2015   TUBAL LIGATION      FAMILY HISTORY: Father with non-Hodgkin's lymphoma, 2 paternal aunts with breast cancer.     ADVANCED DIRECTIVES:    HEALTH MAINTENANCE: Social History   Tobacco Use   Smoking status: Never Smoker   Smokeless tobacco: Never Used  Substance Use Topics   Alcohol use: Yes    Alcohol/week: 0.0 standard drinks    Comment: occasionally wine   Drug use: Yes    Types: Marijuana    Comment: cannabis with no extra  low dose edibles  for neuropathy     Colonoscopy:  PAP:  Bone density:  Lipid panel:  Allergies  Allergen Reactions   Ace Inhibitors Cough   Latex Itching   Morphine And Related Itching    Caused her to itch terribly. Would prefer if given to take with a benadryl   Penicillins Rash    Has patient had a PCN reaction causing immediate rash, facial/tongue/throat swelling, SOB or lightheadedness with hypotension: No Has patient had a PCN reaction causing severe rash involving mucus membranes or skin necrosis: No Has patient had a PCN reaction that required hospitalization No Has patient had a PCN reaction occurring within the last 10 years: No If all of the above answers are "NO", then may proceed with Cephalosporin use.    Current Outpatient Medications  Medication Sig Dispense Refill   acetaminophen (TYLENOL) 500 MG tablet Take 500 mg by mouth every 6 (six) hours as needed for mild pain or moderate pain.      ALPRAZolam (XANAX) 0.25 MG tablet Take 1 tablet (0.25 mg total) by mouth at bedtime. 30 tablet 0   atorvastatin (LIPITOR) 10 MG tablet Take 10 mg by mouth at bedtime.      buPROPion (WELLBUTRIN XL) 150 MG 24 hr tablet Take 150 mg by mouth at bedtime.      Calcium-Magnesium-Vitamin D (CALCIUM 1200+D3 PO) Take 1 tablet by mouth daily.      Cyanocobalamin 5000 MCG CAPS Take 5,000 mcg by mouth daily.      ferrous sulfate 325 (65 FE) MG EC tablet Take 325 mg by mouth daily.      fexofenadine (ALLEGRA) 180 MG  tablet Take 180 mg by mouth at bedtime.      gabapentin (NEURONTIN) 300 MG capsule Take 300-600 mg by mouth 2 (two) times daily. Take 300 mg by mouth in the morning and 344m at night     lidocaine-prilocaine (EMLA) cream Apply 1 application topically as needed. Apply to port 1-2 hours prior to chemotherapy appointment. Cover with plastic wrap. 30 g 0   metFORMIN (GLUCOPHAGE) 850 MG tablet Take 850 mg by mouth 2 (two) times daily with a meal.      mirtazapine (REMERON) 15 MG tablet TAKE 1 TABLET BY MOUTH EVERYDAY AT BEDTIME     Oxycodone HCl 10 MG TABS Take 1 tablet (10 mg total) by mouth every 4 (four) hours as needed. Ok to take every 4-6 hours as needed. 90 tablet 0   pantoprazole (PROTONIX) 40 MG tablet Take 40  mg by mouth at bedtime.      PARoxetine (PAXIL) 40 MG tablet Take 40 mg by mouth at bedtime.      prochlorperazine (COMPAZINE) 10 MG tablet Take 1 tablet (10 mg total) by mouth every 6 (six) hours as needed for nausea or vomiting. 30 tablet 3   sodium chloride (MURO 128) 5 % ophthalmic solution Place 1 drop into both eyes 4 (four) times daily.     zolpidem (AMBIEN) 10 MG tablet Take 10 mg by mouth at bedtime as needed (sleep).      megestrol (MEGACE) 40 MG tablet Take 1 tablet (40 mg total) by mouth daily. (Patient not taking: Reported on 05/22/2018) 30 tablet 2   No current facility-administered medications for this visit.     OBJECTIVE: Vitals:   05/22/18 1001  BP: 139/78  Pulse: 78  Temp: (!) 97.3 F (36.3 C)     Body mass index is 25.92 kg/m.    ECOG FS:3 - Symptomatic, >50% confined to bed  General: Thin, ill-appearing, no acute distress.  Sitting in a wheelchair. Eyes: Pink conjunctiva, anicteric sclera. HEENT: Normocephalic, moist mucous membranes, clear oropharnyx. Lungs: Clear to auscultation bilaterally. Heart: Regular rate and rhythm. No rubs, murmurs, or gallops. Abdomen: Soft, nontender, nondistended. No organomegaly noted, normoactive bowel  sounds. Musculoskeletal: No edema, cyanosis, or clubbing. Neuro: Alert, answering all questions appropriately. Cranial nerves grossly intact. Skin: No rashes or petechiae noted. Psych: Normal affect.  LAB RESULTS:  Lab Results  Component Value Date   NA 139 05/22/2018   K 3.4 (L) 05/22/2018   CL 107 05/22/2018   CO2 23 05/22/2018   GLUCOSE 157 (H) 05/22/2018   BUN 14 05/22/2018   CREATININE 0.89 05/22/2018   CALCIUM 8.1 (L) 05/22/2018   PROT 5.7 (L) 05/22/2018   ALBUMIN 2.8 (L) 05/22/2018   AST 83 (H) 05/22/2018   ALT 36 05/22/2018   ALKPHOS 233 (H) 05/22/2018   BILITOT 0.8 05/22/2018   GFRNONAA >60 05/22/2018   GFRAA >60 05/22/2018    Lab Results  Component Value Date   WBC 19.3 (H) 05/22/2018   NEUTROABS 12.8 (H) 05/22/2018   HGB 7.7 (L) 05/22/2018   HCT 25.4 (L) 05/22/2018   MCV 95.8 05/22/2018   PLT 187 05/22/2018     STUDIES: Ct Chest W Contrast  Result Date: 05/20/2018 CLINICAL DATA:  Metastatic breast cancer restaging EXAM: CT CHEST, ABDOMEN, AND PELVIS WITH CONTRAST TECHNIQUE: Multidetector CT imaging of the chest, abdomen and pelvis was performed following the standard protocol during bolus administration of intravenous contrast. CONTRAST:  27m OMNIPAQUE IOHEXOL 300 MG/ML  SOLN COMPARISON:  Multiple exams, including chest CT of 03/03/2018, and abdomen MRI 02/22/2018 FINDINGS: Streak artifact related to the patient's right shoulder prosthesis and right arm positioning noted. CT CHEST FINDINGS Cardiovascular: Right Port-A-Cath tip: Cavoatrial junction. Small amount of fibrin sheath or possibly minimal chronic thrombus along the catheter in the right internal jugular vein for example on image 8/2. Atherosclerotic calcification of the aortic arch and left anterior descending coronary artery with mild cardiomegaly. Mediastinum/Nodes: Small type 1 hiatal hernia. Contrast medium in the esophagus. Lungs/Pleura: Confluent ground-glass opacities primarily in the left lung,  and with lesser involvement of the right lung, mostly in the basilar portion of the right lower lobe. Stable lingular scarring. Trace left pleural effusion. Musculoskeletal: Widespread osseous metastatic disease with scattered irregular sclerosis in all vertebral levels in the thoracic spine scattered sclerotic lesions in the ribs and sternum as well as the right scapula.  Overall sclerosis appears mildly increased from 03/03/2018. CT ABDOMEN PELVIS FINDINGS Hepatobiliary: Diffuse confluent metastatic disease throughout the liver compatible with increasing confluence of the widespread extensive hepatic metastatic disease. Mildly contracted gallbladder. No biliary dilatation. Pancreas: Unremarkable Spleen: Unremarkable Adrenals/Urinary Tract: Left renal peripelvic cysts. Excreted contrast medium is present in the collecting systems in urinary bladder on initial images, reducing sensitivity for nonobstructive calculi. No hydronephrosis or renal mass identified. The adrenal glands appear unremarkable. Stomach/Bowel: Apparent wall thickening in the gastric cardia may be artifactual. There are a few sigmoid colon diverticula. Mild wall thickening in the proximal sigmoid colon for example on images 91 through 101 of series 2. Vascular/Lymphatic: Aortoiliac atherosclerotic vascular disease. Reproductive: Unremarkable Other: Perihepatic, right paracolic gutter, and right pelvic ascites, overall mild to moderate in volume. Musculoskeletal: Widespread osseous metastatic disease in the lumbar spine and bony pelvis, slightly more sclerotic but without definite new lesions compared to 12/18/2017. Lumbar spondylosis and degenerative disc disease cause right foraminal impingement all levels between L1 and L5, and left foraminal impingement at L3-4 and L4-5. IMPRESSION: 1. New confluent ground-glass opacities throughout much of the left lung, with patchy ground-glass opacities in the right lung favoring the right lung base. The  pattern can be seen setting of asymmetric pulmonary edema, ARDS, atypical pneumonia, acute hypersensitivity, or acute eosinophilic pneumonia. 2. Diffuse confluent metastatic disease throughout the liver, increased from 02/22/2018. 3. Similar distribution of lesions but increased sclerosis in the widespread osseous metastatic disease. This increased sclerosis is nonspecific, and can sometimes indicate effective response to therapy rather than worsening. 4. Mild wall thickening in the proximal sigmoid colon, suggesting mild nonspecific colitis. 5. Other imaging findings of potential clinical significance: Fibrin sheath are minimal chronic thrombus along the internal jugular portion of the right IJ Port-A-Cath. Aortic Atherosclerosis (ICD10-I70.0). Coronary atherosclerosis. Small type 1 hiatal hernia. Esophageal contrast medium suggest reflux or dysmotility. Trace left pleural effusion. Lingular scarring. Mild to moderate ascites. Multilevel lumbar impingement. Electronically Signed   By: Van Clines M.D.   On: 05/20/2018 16:32   Ct Abdomen Pelvis W Contrast  Result Date: 05/20/2018 CLINICAL DATA:  Metastatic breast cancer restaging EXAM: CT CHEST, ABDOMEN, AND PELVIS WITH CONTRAST TECHNIQUE: Multidetector CT imaging of the chest, abdomen and pelvis was performed following the standard protocol during bolus administration of intravenous contrast. CONTRAST:  14m OMNIPAQUE IOHEXOL 300 MG/ML  SOLN COMPARISON:  Multiple exams, including chest CT of 03/03/2018, and abdomen MRI 02/22/2018 FINDINGS: Streak artifact related to the patient's right shoulder prosthesis and right arm positioning noted. CT CHEST FINDINGS Cardiovascular: Right Port-A-Cath tip: Cavoatrial junction. Small amount of fibrin sheath or possibly minimal chronic thrombus along the catheter in the right internal jugular vein for example on image 8/2. Atherosclerotic calcification of the aortic arch and left anterior descending coronary artery with  mild cardiomegaly. Mediastinum/Nodes: Small type 1 hiatal hernia. Contrast medium in the esophagus. Lungs/Pleura: Confluent ground-glass opacities primarily in the left lung, and with lesser involvement of the right lung, mostly in the basilar portion of the right lower lobe. Stable lingular scarring. Trace left pleural effusion. Musculoskeletal: Widespread osseous metastatic disease with scattered irregular sclerosis in all vertebral levels in the thoracic spine scattered sclerotic lesions in the ribs and sternum as well as the right scapula. Overall sclerosis appears mildly increased from 03/03/2018. CT ABDOMEN PELVIS FINDINGS Hepatobiliary: Diffuse confluent metastatic disease throughout the liver compatible with increasing confluence of the widespread extensive hepatic metastatic disease. Mildly contracted gallbladder. No biliary dilatation. Pancreas: Unremarkable Spleen: Unremarkable Adrenals/Urinary  Tract: Left renal peripelvic cysts. Excreted contrast medium is present in the collecting systems in urinary bladder on initial images, reducing sensitivity for nonobstructive calculi. No hydronephrosis or renal mass identified. The adrenal glands appear unremarkable. Stomach/Bowel: Apparent wall thickening in the gastric cardia may be artifactual. There are a few sigmoid colon diverticula. Mild wall thickening in the proximal sigmoid colon for example on images 91 through 101 of series 2. Vascular/Lymphatic: Aortoiliac atherosclerotic vascular disease. Reproductive: Unremarkable Other: Perihepatic, right paracolic gutter, and right pelvic ascites, overall mild to moderate in volume. Musculoskeletal: Widespread osseous metastatic disease in the lumbar spine and bony pelvis, slightly more sclerotic but without definite new lesions compared to 12/18/2017. Lumbar spondylosis and degenerative disc disease cause right foraminal impingement all levels between L1 and L5, and left foraminal impingement at L3-4 and L4-5.  IMPRESSION: 1. New confluent ground-glass opacities throughout much of the left lung, with patchy ground-glass opacities in the right lung favoring the right lung base. The pattern can be seen setting of asymmetric pulmonary edema, ARDS, atypical pneumonia, acute hypersensitivity, or acute eosinophilic pneumonia. 2. Diffuse confluent metastatic disease throughout the liver, increased from 02/22/2018. 3. Similar distribution of lesions but increased sclerosis in the widespread osseous metastatic disease. This increased sclerosis is nonspecific, and can sometimes indicate effective response to therapy rather than worsening. 4. Mild wall thickening in the proximal sigmoid colon, suggesting mild nonspecific colitis. 5. Other imaging findings of potential clinical significance: Fibrin sheath are minimal chronic thrombus along the internal jugular portion of the right IJ Port-A-Cath. Aortic Atherosclerosis (ICD10-I70.0). Coronary atherosclerosis. Small type 1 hiatal hernia. Esophageal contrast medium suggest reflux or dysmotility. Trace left pleural effusion. Lingular scarring. Mild to moderate ascites. Multilevel lumbar impingement. Electronically Signed   By: Van Clines M.D.   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  PLAN:    1.  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  Hospice and end-of-life care were discussed once again, but patient declined and wishes to try additional chemotherapy.  She expressed understanding that given her performance status, there is likely no additional options after trying single agent gemcitabine.  Given patient's persistent pancytopenia, she will likely only be able to tolerate treatment every other week.  Return to  clinic in 1 week for consideration of cycle 1, day 1 of single agent gemcitabine.  2.  Anemia: Patient's hemoglobin has trended up and is now 7.7.  She does not require transfusion today.   3.  Peripheral neuropathy: Patient reports mild improvement and is now down to gabapentin twice per day. 4. Back pain: MRI results from May 04, 2017 did not reveal metastatic disease.  Continue symptomatic treatment.  5.  Left IJ clot: Ultrasound results reviewed independently.  Patient has been instructed to discontinue Eliquis. 6.  Neutropenia: Resolved. Secondary to Halaven.  Patient may require Neulasta with each treatment. 7.  Dental complaints: Patient has been instructed if she needs dental intervention, to proceed as scheduled per her dentist.  Any invasive procedures likely will need to be timed several days prior to day 1 of her next cycle. 8.  Memory complaints: Patient does not complain of this today.  MRI of the brain on December 21, 2017 confirmed widespread osseous metastatic disease, but no intracranial abnormality was noted.  9.  Elevated liver enzymes: AST remains mildly elevated.  Monitor. 10.  Poor appetite: Patient has tried Remeron and dexamethasone without much effect.  She only took 2 days of Megace prior to discontinuing.  Patient does not wish to retry Megace at this time.  Patient will have tele-visit with dietary in the next week. 11.  Insomnia: Improved.  Continue Xanax as prescribed.  Patient expressed understanding and was in agreement with this plan. She also understands that She can call clinic at any time with any questions, concerns, or complaints.   Breast cancer   Staging form: Breast, AJCC 7th Edition     Pathologic stage from 08/11/2014: Stage IIIA (T0, N2a, cM0) - Signed by Lloyd Huger, MD on 08/11/2014   Lloyd Huger, MD   05/22/2018 2:39 PM

## 2018-05-22 NOTE — Progress Notes (Signed)
Amanda [name]

## 2018-05-24 ENCOUNTER — Telehealth: Payer: Self-pay | Admitting: Oncology

## 2018-05-24 DIAGNOSIS — H43393 Other vitreous opacities, bilateral: Secondary | ICD-10-CM | POA: Diagnosis not present

## 2018-05-24 DIAGNOSIS — H1851 Endothelial corneal dystrophy: Secondary | ICD-10-CM | POA: Diagnosis not present

## 2018-05-24 DIAGNOSIS — Z961 Presence of intraocular lens: Secondary | ICD-10-CM | POA: Diagnosis not present

## 2018-05-25 NOTE — Progress Notes (Signed)
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  INTERVAL HISTORY: Patient returns to clinic today for further evaluation and consideration of initiating palliative chemotherapy with gemcitabine.  She continues to have progressive weakness and fatigue as well as a declining performance status.  She has a poor appetite.  She reports difficulty swallowing and coughing, particularly with thin liquids such as water.  Symptoms are not as bad with thicker liquids such as milkshakes or pured food.  She has increased confusion.  Her insomnia has improved.  She has a chronic peripheral neuropathy which is slightly improved, but no other neurologic complaints.  She denies any recent fevers or illnesses.  She denies any chest pain, shortness of breath or hemoptysis. She denies any nausea, vomiting, constipation, or diarrhea. She has no urinary complaints.  Patient continues to feel terrible, but offers no further specific complaints.    REVIEW OF SYSTEMS:   Review of Systems  Constitutional: Positive for malaise/fatigue and weight loss. Negative for fever.  Eyes: Negative.  Negative for blurred vision, double vision and pain.  Respiratory: Positive for cough. Negative for shortness of breath.   Cardiovascular: Negative.  Negative for chest pain and leg swelling.  Gastrointestinal: Negative.  Negative for abdominal pain.  Genitourinary: Negative.  Negative for dysuria and flank pain.  Musculoskeletal: Negative for back pain and falls.  Skin: Negative.  Negative for rash.  Neurological: Positive for tingling, sensory change and weakness. Negative for focal weakness and  headaches.  Endo/Heme/Allergies: Negative.   Psychiatric/Behavioral: Positive for memory loss. The patient is not nervous/anxious and does not have insomnia.     As per HPI. Otherwise, a complete review of systems is negative.  PAST MEDICAL HISTORY: Past Medical History:  Diagnosis Date   Anxiety    Back pain    occasionally   Breast cancer ([place]) 2009   left   Depression    takes Paxil and Wellbutrin daily   Diabetes ([place])    takes Metformin daily   type 2    Genetic testing 02/03/2017   Multi-Cancer panel (83 genes) @ [facility] - No pathogenic mutations detected   GERD (gastroesophageal reflux disease)    occasional   History of bronchitis 2 yrs ago   History of kidney stones    Hyperlipidemia    takes Atorvastatin daily   Hypertension    no meds   Insomnia    takes Ambien nightly   Insomnia    takes gabapentin nightly   Joint pain    Mood swings    Osteoarthritis of knee    Personal history of chemotherapy 12/05/2016   Mets from Breast Cancer   Personal history of radiation therapy 11/2016   Pneumonia    PONV (postoperative nausea and vomiting)    Seasonal allergies    takes Allegra daily    PAST SURGICAL HISTORY: Past Surgical History:  Procedure Laterality Date   BREAST BIOPSY  2009   cataract surgery Bilateral    COLONOSCOPY     IR RADIOLOGIST EVAL & MGMT  01/29/2018   JOINT REPLACEMENT Left 2014   knee   KNEE ARTHROSCOPY Left    x 5   MASTECTOMY Left    port a  cath placed     PORTACATH PLACEMENT N/A 09/17/2015   Procedure: INSERTION PORT-A-CATH;  Surgeon: Jules Husbands, MD;  Location: [facility];  Service: General;  Laterality: N/A;   RADIOLOGY WITH ANESTHESIA N/A 02/20/2018   Procedure: CT WITH ANESTHESIA MICROWAVE THERMAL ABLATION-LIVER;  Surgeon: Jacqulynn Cadet, MD;  Location: [facility];  Service: Anesthesiology;  Laterality: N/A;   TOOTH EXTRACTION     TOTAL SHOULDER ARTHROPLASTY Right 06/03/2015   Procedure: TOTAL  SHOULDER ARTHROPLASTY;  Surgeon: Tania Ade, MD;  Location: [facility];  Service: Orthopedics;  Laterality: Right;  Right total shoulder arthroplasty   TOTAL SHOULDER REPLACEMENT Right 06/03/2015   TUBAL LIGATION      FAMILY HISTORY: Father with non-Hodgkin's lymphoma, 2 paternal aunts with breast cancer.     ADVANCED DIRECTIVES:    HEALTH MAINTENANCE: Social History   Tobacco Use   Smoking status: Never Smoker   Smokeless tobacco: Never Used  Substance Use Topics   Alcohol use: Yes    Alcohol/week: 0.0 standard drinks    Comment: occasionally wine   Drug use: Yes    Types: Marijuana    Comment: cannabis with no extra  low dose edibles  for neuropathy     Colonoscopy:  PAP:  Bone density:  Lipid panel:  Allergies  Allergen Reactions   Ace Inhibitors Cough   Latex Itching   Morphine And Related Itching    Caused her to itch terribly. Would prefer if given to take with a benadryl   Penicillins Rash    Has patient had a PCN reaction causing immediate rash, facial/tongue/throat swelling, SOB or lightheadedness with hypotension: No Has patient had a PCN reaction causing severe rash involving mucus membranes or skin necrosis: No Has patient had a PCN reaction that required hospitalization No Has patient had a PCN reaction occurring within the last 10 years: No If all of the above answers are "NO", then may proceed with Cephalosporin use.    Current Outpatient Medications  Medication Sig Dispense Refill   acetaminophen (TYLENOL) 500 MG tablet Take 500 mg by mouth every 6 (six) hours as needed for mild pain or moderate pain.      ALPRAZolam (XANAX) 0.25 MG tablet Take 1 tablet (0.25 mg total) by mouth at bedtime. 30 tablet 0   atorvastatin (LIPITOR) 10 MG tablet Take 10 mg by mouth at bedtime.      buPROPion (WELLBUTRIN XL) 150 MG 24 hr tablet Take 150 mg by mouth at bedtime.      Calcium-Magnesium-Vitamin D (CALCIUM 1200+D3 PO) Take 1 tablet by mouth daily.       Cyanocobalamin 5000 MCG CAPS Take 5,000 mcg by mouth daily.      ferrous sulfate 325 (65 FE) MG EC tablet Take 325 mg by mouth daily.      fexofenadine (ALLEGRA) 180 MG tablet Take 180 mg by mouth at bedtime.      gabapentin (NEURONTIN) 300 MG capsule Take 300-600 mg by mouth 2 (two) times daily. Take 300 mg by mouth in the morning and 345m at night     levofloxacin (LEVAQUIN) 500 MG tablet Take 1 tablet (500 mg total) by mouth daily. 14 tablet 0   lidocaine-prilocaine (EMLA) cream Apply 1 application topically as needed. Apply to port 1-2 hours prior to chemotherapy appointment. Cover with plastic wrap. 30 g 0   metFORMIN (GLUCOPHAGE) 850 MG tablet Take 850 mg by mouth 2 (two) times daily with a meal.      mirtazapine (REMERON) 15 MG  tablet TAKE 1 TABLET BY MOUTH EVERYDAY AT BEDTIME     Oxycodone HCl 10 MG TABS Take 1 tablet (10 mg total) by mouth every 4 (four) hours as needed. Ok to take every 4-6 hours as needed. 90 tablet 0   pantoprazole (PROTONIX) 40 MG tablet Take 40 mg by mouth at bedtime.      PARoxetine (PAXIL) 40 MG tablet Take 40 mg by mouth at bedtime.      prochlorperazine (COMPAZINE) 10 MG tablet Take 1 tablet (10 mg total) by mouth every 6 (six) hours as needed for nausea or vomiting. 30 tablet 3   sodium chloride (MURO 128) 5 % ophthalmic solution Place 1 drop into both eyes 4 (four) times daily.     zolpidem (AMBIEN) 10 MG tablet Take 10 mg by mouth at bedtime as needed (sleep).      megestrol (MEGACE) 40 MG tablet Take 1 tablet (40 mg total) by mouth daily. (Patient not taking: Reported on 05/22/2018) 30 tablet 2   No current facility-administered medications for this visit.     OBJECTIVE: Vitals:   05/28/18 1033  BP: 126/80  Pulse: (!) 110  Resp: 18     Body mass index is 27.64 kg/m.    ECOG FS:3 - Symptomatic, >50% confined to bed  General: Thin, ill-appearing, no acute distress.  Sitting in a wheelchair. Eyes: Pink conjunctiva, anicteric  sclera. HEENT: Normocephalic, moist mucous membranes, clear oropharnyx. Lungs: Clear to auscultation bilaterally. Heart: Regular rate and rhythm. No rubs, murmurs, or gallops. Abdomen: Soft, nontender, nondistended. No organomegaly noted, normoactive bowel sounds. Musculoskeletal: No edema, cyanosis, or clubbing. Neuro: Alert, answering all questions appropriately. Cranial nerves grossly intact. Skin: No rashes or petechiae noted. Psych: Normal affect.  LAB RESULTS:  Lab Results  Component Value Date   NA 139 05/28/2018   K 4.6 05/28/2018   CL 108 05/28/2018   CO2 22 05/28/2018   GLUCOSE 165 (H) 05/28/2018   BUN 26 (H) 05/28/2018   CREATININE 0.94 05/28/2018   CALCIUM 8.5 (L) 05/28/2018   PROT 5.5 (L) 05/28/2018   ALBUMIN 2.6 (L) 05/28/2018   AST 73 (H) 05/28/2018   ALT 32 05/28/2018   ALKPHOS 227 (H) 05/28/2018   BILITOT 0.6 05/28/2018   GFRNONAA >60 05/28/2018   GFRAA >60 05/28/2018    Lab Results  Component Value Date   WBC 15.9 (H) 05/28/2018   NEUTROABS 13.1 (H) 05/28/2018   HGB 7.0 (L) 05/28/2018   HCT 24.2 (L) 05/28/2018   MCV 98.8 05/28/2018   PLT 162 05/28/2018     STUDIES: Ct Chest W Contrast  Result Date: 05/20/2018 CLINICAL DATA:  Metastatic breast cancer restaging EXAM: CT CHEST, ABDOMEN, AND PELVIS WITH CONTRAST TECHNIQUE: Multidetector CT imaging of the chest, abdomen and pelvis was performed following the standard protocol during bolus administration of intravenous contrast. CONTRAST:  73m OMNIPAQUE IOHEXOL 300 MG/ML  SOLN COMPARISON:  Multiple exams, including chest CT of 03/03/2018, and abdomen MRI 02/22/2018 FINDINGS: Streak artifact related to the patient's right shoulder prosthesis and right arm positioning noted. CT CHEST FINDINGS Cardiovascular: Right Port-A-Cath tip: Cavoatrial junction. Small amount of fibrin sheath or possibly minimal chronic thrombus along the catheter in the right internal jugular vein for example on image 8/2. Atherosclerotic  calcification of the aortic arch and left anterior descending coronary artery with mild cardiomegaly. Mediastinum/Nodes: Small type 1 hiatal hernia. Contrast medium in the esophagus. Lungs/Pleura: Confluent ground-glass opacities primarily in the left lung, and with lesser involvement of the right  lung, mostly in the basilar portion of the right lower lobe. Stable lingular scarring. Trace left pleural effusion. Musculoskeletal: Widespread osseous metastatic disease with scattered irregular sclerosis in all vertebral levels in the thoracic spine scattered sclerotic lesions in the ribs and sternum as well as the right scapula. Overall sclerosis appears mildly increased from 03/03/2018. CT ABDOMEN PELVIS FINDINGS Hepatobiliary: Diffuse confluent metastatic disease throughout the liver compatible with increasing confluence of the widespread extensive hepatic metastatic disease. Mildly contracted gallbladder. No biliary dilatation. Pancreas: Unremarkable Spleen: Unremarkable Adrenals/Urinary Tract: Left renal peripelvic cysts. Excreted contrast medium is present in the collecting systems in urinary bladder on initial images, reducing sensitivity for nonobstructive calculi. No hydronephrosis or renal mass identified. The adrenal glands appear unremarkable. Stomach/Bowel: Apparent wall thickening in the gastric cardia may be artifactual. There are a few sigmoid colon diverticula. Mild wall thickening in the proximal sigmoid colon for example on images 91 through 101 of series 2. Vascular/Lymphatic: Aortoiliac atherosclerotic vascular disease. Reproductive: Unremarkable Other: Perihepatic, right paracolic gutter, and right pelvic ascites, overall mild to moderate in volume. Musculoskeletal: Widespread osseous metastatic disease in the lumbar spine and bony pelvis, slightly more sclerotic but without definite new lesions compared to 12/18/2017. Lumbar spondylosis and degenerative disc disease cause right foraminal  impingement all levels between L1 and L5, and left foraminal impingement at L3-4 and L4-5. IMPRESSION: 1. New confluent ground-glass opacities throughout much of the left lung, with patchy ground-glass opacities in the right lung favoring the right lung base. The pattern can be seen setting of asymmetric pulmonary edema, ARDS, atypical pneumonia, acute hypersensitivity, or acute eosinophilic pneumonia. 2. Diffuse confluent metastatic disease throughout the liver, increased from 02/22/2018. 3. Similar distribution of lesions but increased sclerosis in the widespread osseous metastatic disease. This increased sclerosis is nonspecific, and can sometimes indicate effective response to therapy rather than worsening. 4. Mild wall thickening in the proximal sigmoid colon, suggesting mild nonspecific colitis. 5. Other imaging findings of potential clinical significance: Fibrin sheath are minimal chronic thrombus along the internal jugular portion of the right IJ Port-A-Cath. Aortic Atherosclerosis (ICD10-I70.0). Coronary atherosclerosis. Small type 1 hiatal hernia. Esophageal contrast medium suggest reflux or dysmotility. Trace left pleural effusion. Lingular scarring. Mild to moderate ascites. Multilevel lumbar impingement. Electronically Signed   By: Van Clines M.D.   On: 05/20/2018 16:32   Ct Abdomen Pelvis W Contrast  Result Date: 05/20/2018 CLINICAL DATA:  Metastatic breast cancer restaging EXAM: CT CHEST, ABDOMEN, AND PELVIS WITH CONTRAST TECHNIQUE: Multidetector CT imaging of the chest, abdomen and pelvis was performed following the standard protocol during bolus administration of intravenous contrast. CONTRAST:  51m OMNIPAQUE IOHEXOL 300 MG/ML  SOLN COMPARISON:  Multiple exams, including chest CT of 03/03/2018, and abdomen MRI 02/22/2018 FINDINGS: Streak artifact related to the patient's right shoulder prosthesis and right arm positioning noted. CT CHEST FINDINGS Cardiovascular: Right Port-A-Cath tip:  Cavoatrial junction. Small amount of fibrin sheath or possibly minimal chronic thrombus along the catheter in the right internal jugular vein for example on image 8/2. Atherosclerotic calcification of the aortic arch and left anterior descending coronary artery with mild cardiomegaly. Mediastinum/Nodes: Small type 1 hiatal hernia. Contrast medium in the esophagus. Lungs/Pleura: Confluent ground-glass opacities primarily in the left lung, and with lesser involvement of the right lung, mostly in the basilar portion of the right lower lobe. Stable lingular scarring. Trace left pleural effusion. Musculoskeletal: Widespread osseous metastatic disease with scattered irregular sclerosis in all vertebral levels in the thoracic spine scattered sclerotic lesions in the ribs  and sternum as well as the right scapula. Overall sclerosis appears mildly increased from 03/03/2018. CT ABDOMEN PELVIS FINDINGS Hepatobiliary: Diffuse confluent metastatic disease throughout the liver compatible with increasing confluence of the widespread extensive hepatic metastatic disease. Mildly contracted gallbladder. No biliary dilatation. Pancreas: Unremarkable Spleen: Unremarkable Adrenals/Urinary Tract: Left renal peripelvic cysts. Excreted contrast medium is present in the collecting systems in urinary bladder on initial images, reducing sensitivity for nonobstructive calculi. No hydronephrosis or renal mass identified. The adrenal glands appear unremarkable. Stomach/Bowel: Apparent wall thickening in the gastric cardia may be artifactual. There are a few sigmoid colon diverticula. Mild wall thickening in the proximal sigmoid colon for example on images 91 through 101 of series 2. Vascular/Lymphatic: Aortoiliac atherosclerotic vascular disease. Reproductive: Unremarkable Other: Perihepatic, right paracolic gutter, and right pelvic ascites, overall mild to moderate in volume. Musculoskeletal: Widespread osseous metastatic disease in the lumbar  spine and bony pelvis, slightly more sclerotic but without definite new lesions compared to 12/18/2017. Lumbar spondylosis and degenerative disc disease cause right foraminal impingement all levels between L1 and L5, and left foraminal impingement at L3-4 and L4-5. IMPRESSION: 1. New confluent ground-glass opacities throughout much of the left lung, with patchy ground-glass opacities in the right lung favoring the right lung base. The pattern can be seen setting of asymmetric pulmonary edema, ARDS, atypical pneumonia, acute hypersensitivity, or acute eosinophilic pneumonia. 2. Diffuse confluent metastatic disease throughout the liver, increased from 02/22/2018. 3. Similar distribution of lesions but increased sclerosis in the widespread osseous metastatic disease. This increased sclerosis is nonspecific, and can sometimes indicate effective response to therapy rather than worsening. 4. Mild wall thickening in the proximal sigmoid colon, suggesting mild nonspecific colitis. 5. Other imaging findings of potential clinical significance: Fibrin sheath are minimal chronic thrombus along the internal jugular portion of the right IJ Port-A-Cath. Aortic Atherosclerosis (ICD10-I70.0). Coronary atherosclerosis. Small type 1 hiatal hernia. Esophageal contrast medium suggest reflux or dysmotility. Trace left pleural effusion. Lingular scarring. Mild to moderate ascites. Multilevel lumbar impingement. Electronically Signed   By: Van Clines M.D.   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  PLAN:    1.  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  Hospice and end-of-life care were discussed once again, but patient declined and wishes to try additional  chemotherapy.  She expressed understanding that given her performance status, there is likely no additional options after trying single agent gemcitabine.  Given patient's persistent pancytopenia, she will likely only be able to tolerate treatment every other week.  She wishes to delay treatment today, therefore will return to clinic in 1 week for further evaluation and consideration of cycle 1, day 1 of single agent gemcitabine.  2.  Anemia: Patient's hemoglobin is 7.0 today.  Proceed with 1 unit packed red blood cells. 3.  Peripheral neuropathy: Patient reports mild improvement and is now down to gabapentin twice per day. 4.  Back pain: MRI results from May 04, 2017 did not reveal metastatic disease.  Continue symptomatic treatment.  5.  Left IJ clot: Ultrasound results reviewed independently.  Patient has been instructed to discontinue Eliquis. 6.  Neutropenia: Resolved. Secondary to [place].  Patient may require Neulasta with each treatment. 7.  Dental complaints: Patient has been instructed if she needs dental intervention, to  proceed as scheduled per her dentist.  Any invasive procedures likely will need to be timed several days prior to day 1 of her next cycle. 8.  Memory complaints/confusion:  MRI of the brain on December 21, 2017 confirmed widespread osseous metastatic disease, but no intracranial abnormality was noted.  Repeat MRI the brain in the next week. 9.  Elevated liver enzymes: AST remains mildly elevated.  Monitor. 10.  Poor appetite: Patient has tried Remeron and dexamethasone without much effect.  She only took 2 days of Megace prior to discontinuing.  Patient does not wish to retry Megace at this time.  Appreciate dietary input.   11.  Insomnia: Improved.  Continue Xanax as prescribed. 12.  Swallowing: It appears patient is aspirating.  Will get a swallow study and speech pathology referral. 13.  Bilateral lung opacities: Consistent with infection, possibly related to aspiration.   Continue Levaquin as prescribed.  Patient expressed understanding and was in agreement with this plan. She also understands that She can call clinic at any time with any questions, concerns, or complaints.   Breast cancer   Staging form: Breast, AJCC 7th Edition     Pathologic stage from 08/11/2014: Stage IIIA (T0, N2a, cM0) - Signed by Lloyd Huger, MD on 08/11/2014   Lloyd Huger, MD   05/28/2018 2:20 PM

## 2018-05-27 ENCOUNTER — Inpatient Hospital Stay: Payer: Medicare Other

## 2018-05-27 ENCOUNTER — Other Ambulatory Visit: Payer: Self-pay

## 2018-05-27 NOTE — Progress Notes (Signed)
Nutrition Assessment   Reason for Assessment:  Patient for weight loss and poor appetite   ASSESSMENT:  68 year old female with progressive ER/PR positive breast cancer with metastatic disease to liver and bone.  Past medical history of DM, HTN, GERD, HLD, depression.  Patient followed by Dr. Grayland Ormond.  Planning to start gemcitabine.    Spoke with patient and husband via phone for nutrition evaluation.  Husband reports that patient fell about 2-3 weeks ago while she was putting her pants on and busted her lip.  Reports that since then she has had a hard time drinking from water bottle (runs down side of face).  Also has been having a hard time swallowing foods.  Reports ate few bites of chicken that as chopped up well almost ground per husband and ate few bites and could not eat more due to swallowing.  Reports mashed potatoes yesterday were hard for her to swallow.  Able to drink ensure high protein (160 calories with 16 g protein) mixed with whey protein powder (7.5 g protein), some ice cream.  Husband reports patient is getting about 500 calories or less per day.  Husband reports coughing episodes as well occurred last night while eating dinner last night.  Husband reports it seems like she has had a "mini stroke."    Husband also reports taste alterations.    Nutrition Focused Physical Exam: deferred   Medications: Calcium-Vit D, Vit B 12, ferrous sulfate, megace, metformin, remeron, compazine   Labs: K 3.4, glucose 157   Anthropometrics:   Height: 64 inches Weight: 151 lb 5/6 Noted 175 lb on 4/1 BMI: 25  14% weight loss in the last month, significant  Estimated Energy Needs  Kcals: 2000-2300 Protein: 100-115 g Fluid:  >2 L/d   NUTRITION DIAGNOSIS: Inadequate oral intake related to cancer related treatments, dysphagia as evidenced by poor po intake and weight loss of 14% in the last month   INTERVENTION:  Spoke with Dr. Grayland Ormond regarding husband's report of fall,  dysphagia, coughing and concerns for mini stroke.  MD recommended husband take patient to emergency room for full work-up to evaluate for stroke.  Discussed with husband and he is concerned about taking patient to emergency room with COVID-19 pandemic.  Explained to husband that emergency room is taking precautions to protect patient from COVID-19 and importance of having patient evaluated in ER due to symptoms.  Husband reports that he will discuss with Dr. Grayland Ormond tomorrow at appointment. Encouraged switching from ensure high protein to ensure enlive (350 calories, 20 g protein) for added calories and protein. Encouraged continued use of protein powder.  Discussed pureeing, grinding foods in food processor for ease of swallowing.  Patient may benefit from SLP evaluation with dysphagia and coughing with eating. Discussed strategies to help with taste change.  Will email handouts Also discussed ways to increase calories and protein and will email handouts.  Contact information provided   MONITORING, EVALUATION, GOAL: Patient will consume adequate calories to maintain weight   Next Visit: phone follow-up Thursday, May 28  Taye Cato B. Zenia Resides, [place], [place] Registered Dietitian [phone] (pager)

## 2018-05-28 ENCOUNTER — Other Ambulatory Visit: Payer: Self-pay | Admitting: Oncology

## 2018-05-28 ENCOUNTER — Encounter: Payer: Self-pay | Admitting: Oncology

## 2018-05-28 ENCOUNTER — Inpatient Hospital Stay: Payer: Medicare Other

## 2018-05-28 ENCOUNTER — Inpatient Hospital Stay (HOSPITAL_BASED_OUTPATIENT_CLINIC_OR_DEPARTMENT_OTHER): Payer: Medicare Other | Admitting: Oncology

## 2018-05-28 ENCOUNTER — Other Ambulatory Visit: Payer: Self-pay

## 2018-05-28 VITALS — BP 126/80 | HR 110 | Resp 18 | Wt 161.0 lb

## 2018-05-28 DIAGNOSIS — D649 Anemia, unspecified: Secondary | ICD-10-CM | POA: Diagnosis not present

## 2018-05-28 DIAGNOSIS — R05 Cough: Secondary | ICD-10-CM | POA: Diagnosis not present

## 2018-05-28 DIAGNOSIS — R531 Weakness: Secondary | ICD-10-CM

## 2018-05-28 DIAGNOSIS — Z5111 Encounter for antineoplastic chemotherapy: Secondary | ICD-10-CM | POA: Diagnosis not present

## 2018-05-28 DIAGNOSIS — R5383 Other fatigue: Secondary | ICD-10-CM

## 2018-05-28 DIAGNOSIS — C50919 Malignant neoplasm of unspecified site of unspecified female breast: Secondary | ICD-10-CM

## 2018-05-28 DIAGNOSIS — C7951 Secondary malignant neoplasm of bone: Secondary | ICD-10-CM

## 2018-05-28 DIAGNOSIS — R1310 Dysphagia, unspecified: Secondary | ICD-10-CM

## 2018-05-28 DIAGNOSIS — T17908A Unspecified foreign body in respiratory tract, part unspecified causing other injury, initial encounter: Secondary | ICD-10-CM

## 2018-05-28 DIAGNOSIS — G4700 Insomnia, unspecified: Secondary | ICD-10-CM | POA: Diagnosis not present

## 2018-05-28 DIAGNOSIS — I82C12 Acute embolism and thrombosis of left internal jugular vein: Secondary | ICD-10-CM

## 2018-05-28 DIAGNOSIS — C787 Secondary malignant neoplasm of liver and intrahepatic bile duct: Secondary | ICD-10-CM | POA: Diagnosis not present

## 2018-05-28 DIAGNOSIS — R410 Disorientation, unspecified: Secondary | ICD-10-CM | POA: Diagnosis not present

## 2018-05-28 DIAGNOSIS — G620 Drug-induced polyneuropathy: Secondary | ICD-10-CM

## 2018-05-28 DIAGNOSIS — R918 Other nonspecific abnormal finding of lung field: Secondary | ICD-10-CM

## 2018-05-28 LAB — COMPREHENSIVE METABOLIC PANEL
ALT: 32 U/L (ref 0–44)
AST: 73 U/L — ABNORMAL HIGH (ref 15–41)
Albumin: 2.6 g/dL — ABNORMAL LOW (ref 3.5–5.0)
Alkaline Phosphatase: 227 U/L — ABNORMAL HIGH (ref 38–126)
Anion gap: 9 (ref 5–15)
BUN: 26 mg/dL — ABNORMAL HIGH (ref 8–23)
CO2: 22 mmol/L (ref 22–32)
Calcium: 8.5 mg/dL — ABNORMAL LOW (ref 8.9–10.3)
Chloride: 108 mmol/L (ref 98–111)
Creatinine, Ser: 0.94 mg/dL (ref 0.44–1.00)
GFR calc Af Amer: >60 mL/min (ref >60)
GFR calc non Af Amer: >60 mL/min (ref >60)
Glucose, Bld: 165 mg/dL — ABNORMAL HIGH (ref 70–99)
Potassium: 4.6 mmol/L (ref 3.5–5.1)
Sodium: 139 mmol/L (ref 135–145)
Total Bilirubin: 0.6 mg/dL (ref 0.3–1.2)
Total Protein: 5.5 g/dL — ABNORMAL LOW (ref 6.5–8.1)

## 2018-05-28 LAB — CBC WITH DIFFERENTIAL/PLATELET
Abs Immature Granulocytes: 0.85 10*3/uL — ABNORMAL HIGH (ref 0.00–0.07)
Basophils Absolute: 0 10*3/uL (ref 0.0–0.1)
Basophils Relative: 0 %
Eosinophils Absolute: 0 10*3/uL (ref 0.0–0.5)
Eosinophils Relative: 0 %
HCT: 24.2 % — ABNORMAL LOW (ref 36.0–46.0)
Hemoglobin: 7 g/dL — ABNORMAL LOW (ref 12.0–15.0)
Immature Granulocytes: 5 %
Lymphocytes Relative: 4 %
Lymphs Abs: 0.6 10*3/uL — ABNORMAL LOW (ref 0.7–4.0)
MCH: 28.6 pg (ref 26.0–34.0)
MCHC: 28.9 g/dL — ABNORMAL LOW (ref 30.0–36.0)
MCV: 98.8 fL (ref 80.0–100.0)
Monocytes Absolute: 1.3 10*3/uL — ABNORMAL HIGH (ref 0.1–1.0)
Monocytes Relative: 8 %
Neutro Abs: 13.1 10*3/uL — ABNORMAL HIGH (ref 1.7–7.7)
Neutrophils Relative %: 83 %
Platelets: 162 10*3/uL (ref 150–400)
RBC: 2.45 MIL/uL — ABNORMAL LOW (ref 3.87–5.11)
RDW: 21.6 % — ABNORMAL HIGH (ref 11.5–15.5)
WBC: 15.9 10*3/uL — ABNORMAL HIGH (ref 4.0–10.5)
nRBC: 0.8 % — ABNORMAL HIGH (ref 0.0–0.2)

## 2018-05-28 LAB — SAMPLE TO BLOOD BANK

## 2018-05-28 LAB — PREPARE RBC (CROSSMATCH)

## 2018-05-28 MED ORDER — HEPARIN SOD (PORK) LOCK FLUSH 100 UNIT/ML IV SOLN: 500.0000 [IU] | Freq: Every day | INTRAVENOUS | Status: DC | PRN

## 2018-05-28 MED ORDER — SODIUM CHLORIDE 0.9% IV SOLUTION
250.0000 mL | Freq: Once | INTRAVENOUS | Status: AC
  Administered 2018-05-28: 12:00:00 250 mL via INTRAVENOUS
  Filled 2018-05-28: qty 250

## 2018-05-28 MED ORDER — SODIUM CHLORIDE 0.9% FLUSH
10.0000 mL | Freq: Once | INTRAVENOUS | Status: AC
  Administered 2018-05-28: 10:00:00 10 mL via INTRAVENOUS
  Filled 2018-05-28: qty 10

## 2018-05-28 MED ORDER — DIPHENHYDRAMINE HCL 50 MG/ML IJ SOLN: 25.0000 mg | Freq: Once | INTRAMUSCULAR | Status: DC

## 2018-05-28 MED ORDER — ACETAMINOPHEN 325 MG PO TABS
650.0000 mg | ORAL_TABLET | Freq: Once | ORAL | Status: AC
  Administered 2018-05-28: 650 mg via ORAL
  Filled 2018-05-28: qty 2

## 2018-05-28 MED ORDER — HEPARIN SOD (PORK) LOCK FLUSH 100 UNIT/ML IV SOLN
500.0000 [IU] | Freq: Once | INTRAVENOUS | Status: DC
  Administered 2018-05-28: 15:00:00 500 [IU] via INTRAVENOUS
  Filled 2018-05-28: qty 5

## 2018-05-28 NOTE — Addendum Note (Signed)
Addended by: Meribeth Mattes H on: 05/28/2018 12:48 PM   Modules accepted: Orders

## 2018-05-28 NOTE — Progress Notes (Signed)
Per Dr. Grayland Ormond, no treatment at this time, Pt to receive one unit PRBCs.

## 2018-05-29 ENCOUNTER — Ambulatory Visit
Admission: RE | Admit: 2018-05-29 | Discharge: 2018-05-29 | Disposition: A | Discharge status: Home / Self Care | Payer: Medicare Other | Source: Ambulatory Visit | Attending: Oncology | Admitting: Oncology

## 2018-05-29 DIAGNOSIS — R1310 Dysphagia, unspecified: Secondary | ICD-10-CM | POA: Diagnosis not present

## 2018-05-29 DIAGNOSIS — R1312 Dysphagia, oropharyngeal phase: Secondary | ICD-10-CM

## 2018-05-29 DIAGNOSIS — T17908A Unspecified foreign body in respiratory tract, part unspecified causing other injury, initial encounter: Secondary | ICD-10-CM

## 2018-05-29 LAB — TYPE AND SCREEN
ABO/RH(D): O POS
Antibody Screen: NEGATIVE
Unit division: 0

## 2018-05-29 LAB — BPAM RBC
Blood Product Expiration Date: 202005262359
ISSUE DATE / TIME: 202005121316
Unit Type and Rh: 5100

## 2018-05-29 NOTE — Therapy (Signed)
[place], [place], [address] Phone: [phone]   Fax:     Modified Barium Swallow  Patient Details  Name: Amanda [name] MRN: [id number] Date of Birth: Jun 16, 1950 No data recorded  Encounter Date: 05/29/2018  End of Session - 05/29/18 1303    Visit Number  1    Number of Visits  1    Date for SLP Re-Evaluation  05/29/18    SLP Start Time  1203    SLP Stop Time   1303    SLP Time Calculation (min)  60 min    Activity Tolerance  Patient tolerated treatment well       Past Medical History:  Diagnosis Date  . Anxiety   . Back pain    occasionally  . Breast cancer ([place]) 2009   left  . Depression    takes Paxil and Wellbutrin daily  . Diabetes ([place])    takes Metformin daily   type 2   . Genetic testing 02/03/2017   Multi-Cancer panel (83 genes) @ [facility] - No pathogenic mutations detected  . GERD (gastroesophageal reflux disease)    occasional  . History of bronchitis 2 yrs ago  . History of kidney stones   . Hyperlipidemia    takes Atorvastatin daily  . Hypertension    no meds  . Insomnia    takes Ambien nightly  . Insomnia    takes gabapentin nightly  . Joint pain   . Mood swings   . Osteoarthritis of knee   . Personal history of chemotherapy 12/05/2016   Mets from Breast Cancer  . Personal history of radiation therapy 11/2016  . Pneumonia   . PONV (postoperative nausea and vomiting)   . Seasonal allergies    takes Allegra daily    Past Surgical History:  Procedure Laterality Date  . BREAST BIOPSY  2009  . cataract surgery Bilateral   . COLONOSCOPY    . IR RADIOLOGIST EVAL & MGMT  01/29/2018  . JOINT REPLACEMENT Left 2014   knee  . KNEE ARTHROSCOPY Left    x 5  . MASTECTOMY Left   . port a cath placed    . PORTACATH PLACEMENT N/A 09/17/2015   Procedure: INSERTION PORT-A-CATH;  Surgeon: Jules Husbands, MD;  Location: [facility];  Service: General;  Laterality: N/A;  .  RADIOLOGY WITH ANESTHESIA N/A 02/20/2018   Procedure: CT WITH ANESTHESIA MICROWAVE THERMAL ABLATION-LIVER;  Surgeon: Jacqulynn Cadet, MD;  Location: [facility];  Service: Anesthesiology;  Laterality: N/A;  . TOOTH EXTRACTION    . TOTAL SHOULDER ARTHROPLASTY Right 06/03/2015   Procedure: TOTAL SHOULDER ARTHROPLASTY;  Surgeon: Tania Ade, MD;  Location: [facility];  Service: Orthopedics;  Laterality: Right;  Right total shoulder arthroplasty  . TOTAL SHOULDER REPLACEMENT Right 06/03/2015  . TUBAL LIGATION      There were no vitals filed for this visit.   Subjective: Patient behavior: (alertness, ability to follow instructions, etc.):  The patient appears weak, but able to participate well  Chief complaint: chest imaging concerning for aspiration  RD note 05/27/2018: "68 year old female with progressive ER/PR positive breast cancer with metastatic disease to liver and bone.  Past medical history of DM, HTN, GERD, HLD, depression.  Patient followed by Dr. Grayland Ormond.  Planning to start gemcitabine.   Spoke with patient and husband via phone for nutrition evaluation.  Husband reports that patient fell about 2-3 weeks ago while she was putting her  pants on and busted her lip.  Reports that since then she has had a hard time drinking from water bottle (runs down side of face).  Also has been having a hard time swallowing foods.  Reports ate few bites of chicken that as chopped up well almost ground per husband and ate few bites and could not eat more due to swallowing.  Reports mashed potatoes yesterday were hard for her to swallow.  Able to drink ensure high protein (160 calories with 16 g protein) mixed with whey protein powder (7.5 g protein), some ice cream.  Husband reports patient is getting about 500 calories or less per day.  Husband reports coughing episodes as well occurred last night while eating dinner last night.  Husband reports it seems like she has had a "mini stroke."   Husband also reports taste  alterations." Chest imaging: Bilateral lung opacities: Consistent with infection, possibly related to aspiration.     Objective:  Radiological Procedure: A videoflouroscopic evaluation of oral-preparatory, reflex initiation, and pharyngeal phases of the swallow was performed; as well as a screening of the upper esophageal phase.  I. POSTURE: Upright in MBS chair, supported  II. VIEW: Lateral   III. COMPENSATORY STRATEGIES: N/A  IV. BOLUSES ADMINISTERED:   Thin Liquid: 1 cup rim, 2 straw    Nectar-thick Liquid: 1 moderate   Honey-thick Liquid: DNT   Puree: 2 teaspoon presentations   Mechanical Soft: 1/4 graham cracker in applesauce  V. RESULTS OF EVALUATION: A. ORAL PREPARATORY PHASE: (The lips, tongue, and velum are observed for strength and coordination)       **Overall Severity Rating: Mild; prolonged mastication and piecemeal transfer of chewable solid  B. SWALLOW INITIATION/REFLEX: (The reflex is normal if "triggered" by the time the bolus reached the base of the tongue)  **Overall Severity Rating: Mild; triggers at the valleculae  C. PHARYNGEAL PHASE: (Pharyngeal function is normal if the bolus shows rapid, smooth, and continuous transit through the pharynx and there is no pharyngeal residue after the swallow)  **Overall Severity Rating: within normal limits   D. LARYNGEAL PENETRATION: (Material entering into the laryngeal inlet/vestibule but not aspirated) NONE  E. ASPIRATION: NONE  F. ESOPHAGEAL PHASE: (Screening of the upper esophagus): no abnormality within the viewable cervical esophagus  ASSESSMENT: This 68 year old woman; with concern for aspiration secondary bilateral lung opacities; is presenting with mild oropharyngeal dysphagia characterized by piecemeal oral stage for chewable solids and delayed pharyngeal swallow initiation.  Otherwise, oral control of the bolus including oral hold, rotary mastication, and anterior to posterior transfer is within functional  limits (slowed oral management, but no other concerns).   Aspects of the pharyngeal stage of swallowing including tongue base retraction, hyolaryngeal excursion, epiglottic inversion, and duration/amplitude of UES opening are within normal limits.  There is no observed pharyngeal residue, laryngeal penetration, or tracheal aspiration.  The patient was assessed drinking thin liquid via cup rim and straw.  The patient is not at significant risk for prandial aspiration.  The patient stated that her primary concern was food "not going down".  She denied the sense that she was aspirating.  The patient may benefit from referral to GI to assess esophageal function.  In the meantime, softer and moister foods may be swallowed easier.  PLAN/RECOMMENDATIONS:   A. Diet: Regular- soften and moisten as needed for ease of swallowing   B. Swallowing Precautions: Standard   C. Recommended consultation to: GI, RD for easy to swallow food recommendations   D. Therapy recommendations:  speech therapy is not indicated   E. Results and recommendations were discussed with the patient immediately following the study and the final report routed to the referring MD.                    Dysphagia, oropharyngeal phase  Aspiration into airway, initial encounter - Plan: DG SWALLOW FUNC OP MEDICARE SPEECH PATH, DG SWALLOW FUNC OP MEDICARE SPEECH PATH        Problem List Patient Active Problem List   Diagnosis Date Noted  . Dizziness 03/03/2018  . CAP (community acquired pneumonia) 11/03/2017  . Acute deep vein thrombosis (DVT) of axillary vein ([facility]) 04/05/2017  . Syncope 04/05/2017  . SOB (shortness of breath) 04/03/2017  . Neuropathy due to chemotherapeutic drug ([place]) 03/19/2017  . Genetic testing 02/03/2017  . Contusion of knee 12/06/2016  . Dislocation of shoulder joint 12/06/2016  . Shoulder joint pain 12/06/2016  . Localized, primary osteoarthritis 12/06/2016  . Osteoarthritis of knee  12/06/2016  . Burning sensation 11/13/2016  . Numbness and tingling 11/13/2016  . Malignant neoplasm of left breast in female, estrogen receptor positive ([place]) 10/05/2016  . Carcinoma of left breast, stage 4 ([place]) 04/06/2016  . Chemotherapy-induced neuropathy ([place]) 03/07/2016  . Metastatic breast cancer ([place]) 09/24/2015  . 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 Personal history of breast cancer 08/23/2015  . 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 Degenerative arthritis of right shoulder region 01/13/2015  . Cancer of left female breast  ([place]) 07/27/2014  . 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 Rotator cuff tear 06/10/2014  . Allergic rhinitis, seasonal 09/16/2013  . Depression 08/10/2013  . Diabetes mellitus type 2, uncomplicated ([place]) 14/48/1856  . Hyperlipidemia, unspecified 08/10/2013  . BP (high blood pressure) 08/10/2013  . Osteoporosis, post-menopausal 08/10/2013   Leroy Sea, MS/CCC- SLP  Lou Miner 05/29/2018, 1:04 PM  [facility] [place], [place], [address] Phone: [phone]   Fax:     Name: Amanda [name] MRN: [id number] Date of Birth: 17-Apr-1950

## 2018-05-30 ENCOUNTER — Telehealth: Payer: Self-pay

## 2018-05-30 NOTE — Telephone Encounter (Signed)
Nancee Liter from [facility] called back to let you know that she had tried to schedule the referral for speech pathology. However, since they are currently closed due to COVID-19, it would take a while to get back to her. She just wanted you to know that she is still working on it and once it had been scheduled, she will call you back with the appointment information. Please give her a call in case you have further questions.

## 2018-05-30 NOTE — Telephone Encounter (Signed)
Call was made to [facility] to let her know that patient will not need the referral.

## 2018-05-30 NOTE — Telephone Encounter (Signed)
Based on the results of the barium swallow, they said she does not need speech path anyway.

## 2018-05-30 NOTE — Telephone Encounter (Signed)
Dr. Grayland Ormond, patient will not need this referral then? Please let me know so I could call Nancee Liter back. Thank you.

## 2018-05-30 NOTE — Telephone Encounter (Signed)
No, we are good. Thanks!

## 2018-05-30 NOTE — Telephone Encounter (Signed)
Just an FYI... not sure when patient will be scheduled for speech evaluation or modified barium swallow. Thanks!

## 2018-06-01 NOTE — Progress Notes (Signed)
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  INTERVAL HISTORY: Patient returns to clinic today for further evaluation, discussion of her imaging results, and reconsideration of palliative gemcitabine. She continues to have progressive weakness and fatigue as well as a declining performance status.  She has a poor appetite.  She continues to have cough when eating.  Her insomnia has improved.  She has a chronic peripheral neuropathy which is slightly improved, but no other neurologic complaints.  She denies any recent fevers or illnesses.  She denies any chest pain, shortness of breath or hemoptysis. She denies any nausea, vomiting, constipation, or diarrhea. She has no urinary complaints.  Patient continues to feel generally terrible, but offers no further specific complaints today.  REVIEW OF SYSTEMS:   Review of Systems  Constitutional: Positive for malaise/fatigue and weight loss. Negative for fever.  Eyes: Negative.  Negative for blurred vision, double vision and pain.  Respiratory: Positive for cough. Negative for shortness of breath.   Cardiovascular: Negative.  Negative for chest pain and leg swelling.  Gastrointestinal: Negative.  Negative for abdominal pain.  Genitourinary: Negative.  Negative for dysuria and flank pain.  Musculoskeletal: Negative for back pain and falls.  Skin: Negative.  Negative for rash.  Neurological: Positive for tingling, sensory change and weakness. Negative for focal weakness and headaches.  Endo/Heme/Allergies: Negative.   Psychiatric/Behavioral: Positive for memory loss. The patient is not nervous/anxious and does not  have insomnia.     As per HPI. Otherwise, a complete review of systems is negative.  PAST MEDICAL HISTORY: Past Medical History:  Diagnosis Date   Anxiety    Back pain    occasionally   Breast cancer ([place]) 2009   left   Depression    takes Paxil and Wellbutrin daily   Diabetes ([place])    takes Metformin daily   type 2    Genetic testing 02/03/2017   Multi-Cancer panel (83 genes) @ [facility] - No pathogenic mutations detected   GERD (gastroesophageal reflux disease)    occasional   History of bronchitis 2 yrs ago   History of kidney stones    Hyperlipidemia    takes Atorvastatin daily   Hypertension    no meds   Insomnia    takes Ambien nightly   Insomnia    takes gabapentin nightly   Joint pain    Mood swings    Osteoarthritis of knee    Personal history of chemotherapy 12/05/2016   Mets from Breast Cancer   Personal history of radiation therapy 11/2016   Pneumonia    PONV (postoperative nausea and vomiting)    Seasonal allergies    takes Allegra daily    PAST SURGICAL HISTORY: Past Surgical History:  Procedure Laterality Date   BREAST BIOPSY  2009   cataract surgery Bilateral    COLONOSCOPY     IR RADIOLOGIST EVAL & MGMT  01/29/2018   JOINT REPLACEMENT Left 2014   knee   KNEE ARTHROSCOPY Left    x 5   MASTECTOMY Left    port a cath placed     PORTACATH PLACEMENT N/A 09/17/2015   Procedure: INSERTION PORT-A-CATH;  Surgeon: Jules Husbands, MD;  Location: [facility];  Service: General;  Laterality: N/A;   RADIOLOGY WITH ANESTHESIA N/A 02/20/2018   Procedure: CT WITH ANESTHESIA MICROWAVE THERMAL ABLATION-LIVER;  Surgeon: Jacqulynn Cadet, MD;  Location: [facility];  Service: Anesthesiology;  Laterality: N/A;   TOOTH EXTRACTION     TOTAL SHOULDER ARTHROPLASTY Right 06/03/2015   Procedure: TOTAL SHOULDER ARTHROPLASTY;  Surgeon: Tania Ade, MD;  Location: [place];  Service: Orthopedics;  Laterality: Right;  Right total shoulder arthroplasty     TOTAL SHOULDER REPLACEMENT Right 06/03/2015   TUBAL LIGATION      FAMILY HISTORY: Father with non-Hodgkin's lymphoma, 2 paternal aunts with breast cancer.     ADVANCED DIRECTIVES:    HEALTH MAINTENANCE: Social History   Tobacco Use   Smoking status: Never Smoker   Smokeless tobacco: Never Used  Substance Use Topics   Alcohol use: Yes    Alcohol/week: 0.0 standard drinks    Comment: occasionally wine   Drug use: Yes    Types: Marijuana    Comment: cannabis with no extra  low dose edibles  for neuropathy     Colonoscopy:  PAP:  Bone density:  Lipid panel:  Allergies  Allergen Reactions   Ace Inhibitors Cough   Latex Itching   Morphine And Related Itching    Caused her to itch terribly. Would prefer if given to take with a benadryl   Penicillins Rash    Has patient had a PCN reaction causing immediate rash, facial/tongue/throat swelling, SOB or lightheadedness with hypotension: No Has patient had a PCN reaction causing severe rash involving mucus membranes or skin necrosis: No Has patient had a PCN reaction that required hospitalization No Has patient had a PCN reaction occurring within the last 10 years: No If all of the above answers are "NO", then may proceed with Cephalosporin use.    Current Outpatient Medications  Medication Sig Dispense Refill   atorvastatin (LIPITOR) 10 MG tablet Take 10 mg by mouth at bedtime.      buPROPion (WELLBUTRIN XL) 150 MG 24 hr tablet Take 150 mg by mouth at bedtime.      Calcium-Magnesium-Vitamin D (CALCIUM 1200+D3 PO) Take 1 tablet by mouth daily.      Cyanocobalamin 5000 MCG CAPS Take 5,000 mcg by mouth daily.      fexofenadine (ALLEGRA) 180 MG tablet Take 180 mg by mouth at bedtime.      gabapentin (NEURONTIN) 300 MG capsule Take 300-600 mg by mouth 2 (two) times daily. Take 300 mg by mouth in the morning and 367m at night     levofloxacin (LEVAQUIN) 500 MG tablet Take 1 tablet (500 mg total) by mouth daily.  14 tablet 0   lidocaine-prilocaine (EMLA) cream Apply 1 application topically as needed. Apply to port 1-2 hours prior to chemotherapy appointment. Cover with plastic wrap. 30 g 0   metFORMIN (GLUCOPHAGE) 850 MG tablet Take 850 mg by mouth 2 (two) times daily with a meal.      pantoprazole (PROTONIX) 40 MG tablet Take 40 mg by mouth at bedtime.      PARoxetine (PAXIL) 40 MG tablet Take 40 mg by mouth at bedtime.      prochlorperazine (COMPAZINE) 10 MG tablet Take 1 tablet (10 mg total) by mouth every 6 (six) hours as needed for nausea or vomiting. 30 tablet 3   sodium chloride (MURO 128) 5 % ophthalmic solution Place 1 drop into both eyes 4 (four) times daily.     zolpidem (AMBIEN) 10 MG tablet Take 10 mg by mouth at  bedtime as needed (sleep).      acetaminophen (TYLENOL) 500 MG tablet Take 500 mg by mouth every 6 (six) hours as needed for mild pain or moderate pain.      Oxycodone HCl 10 MG TABS Take 1 tablet (10 mg total) by mouth every 4 (four) hours as needed. Ok to take every 4-6 hours as needed. (Patient not taking: Reported on 06/04/2018) 90 tablet 0   No current facility-administered medications for this visit.     OBJECTIVE: Vitals:   06/04/18 0946  BP: 119/75  Pulse: (!) 113  Resp: 16  Temp: (!) 96.9 F (36.1 C)     Body mass index is 25.75 kg/m.    ECOG FS:3 - Symptomatic, >50% confined to bed  General: Thin, ill-appearing, sitting in a wheelchair. Eyes: Pink conjunctiva, anicteric sclera. HEENT: Normocephalic, moist mucous membranes, clear oropharnyx. Lungs: Clear to auscultation bilaterally. Heart: Regular rate and rhythm. No rubs, murmurs, or gallops. Abdomen: Soft, nontender, nondistended. No organomegaly noted, normoactive bowel sounds. Musculoskeletal: No edema, cyanosis, or clubbing. Neuro: Alert, answering all questions appropriately. Cranial nerves grossly intact. Skin: No rashes or petechiae noted. Psych: Normal affect.  LAB RESULTS:  Lab Results    Component Value Date   NA 138 06/04/2018   K 4.2 06/04/2018   CL 105 06/04/2018   CO2 24 06/04/2018   GLUCOSE 183 (H) 06/04/2018   BUN 23 06/04/2018   CREATININE 0.89 06/04/2018   CALCIUM 8.0 (L) 06/04/2018   PROT 5.8 (L) 06/04/2018   ALBUMIN 3.0 (L) 06/04/2018   AST 101 (H) 06/04/2018   ALT 47 (H) 06/04/2018   ALKPHOS 186 (H) 06/04/2018   BILITOT 0.7 06/04/2018   GFRNONAA >60 06/04/2018   GFRAA >60 06/04/2018    Lab Results  Component Value Date   WBC 8.9 06/04/2018   NEUTROABS 6.6 06/04/2018   HGB 8.3 (L) 06/04/2018   HCT 27.1 (L) 06/04/2018   MCV 97.8 06/04/2018   PLT 220 06/04/2018     STUDIES: Ct Chest W Contrast  Result Date: 05/20/2018 CLINICAL DATA:  Metastatic breast cancer restaging EXAM: CT CHEST, ABDOMEN, AND PELVIS WITH CONTRAST TECHNIQUE: Multidetector CT imaging of the chest, abdomen and pelvis was performed following the standard protocol during bolus administration of intravenous contrast. CONTRAST:  41m OMNIPAQUE IOHEXOL 300 MG/ML  SOLN COMPARISON:  Multiple exams, including chest CT of 03/03/2018, and abdomen MRI 02/22/2018 FINDINGS: Streak artifact related to the patient's right shoulder prosthesis and right arm positioning noted. CT CHEST FINDINGS Cardiovascular: Right Port-A-Cath tip: Cavoatrial junction. Small amount of fibrin sheath or possibly minimal chronic thrombus along the catheter in the right internal jugular vein for example on image 8/2. Atherosclerotic calcification of the aortic arch and left anterior descending coronary artery with mild cardiomegaly. Mediastinum/Nodes: Small type 1 hiatal hernia. Contrast medium in the esophagus. Lungs/Pleura: Confluent ground-glass opacities primarily in the left lung, and with lesser involvement of the right lung, mostly in the basilar portion of the right lower lobe. Stable lingular scarring. Trace left pleural effusion. Musculoskeletal: Widespread osseous metastatic disease with scattered irregular sclerosis  in all vertebral levels in the thoracic spine scattered sclerotic lesions in the ribs and sternum as well as the right scapula. Overall sclerosis appears mildly increased from 03/03/2018. CT ABDOMEN PELVIS FINDINGS Hepatobiliary: Diffuse confluent metastatic disease throughout the liver compatible with increasing confluence of the widespread extensive hepatic metastatic disease. Mildly contracted gallbladder. No biliary dilatation. Pancreas: Unremarkable Spleen: Unremarkable Adrenals/Urinary Tract: Left renal peripelvic cysts. Excreted contrast medium  is present in the collecting systems in urinary bladder on initial images, reducing sensitivity for nonobstructive calculi. No hydronephrosis or renal mass identified. The adrenal glands appear unremarkable. Stomach/Bowel: Apparent wall thickening in the gastric cardia may be artifactual. There are a few sigmoid colon diverticula. Mild wall thickening in the proximal sigmoid colon for example on images 91 through 101 of series 2. Vascular/Lymphatic: Aortoiliac atherosclerotic vascular disease. Reproductive: Unremarkable Other: Perihepatic, right paracolic gutter, and right pelvic ascites, overall mild to moderate in volume. Musculoskeletal: Widespread osseous metastatic disease in the lumbar spine and bony pelvis, slightly more sclerotic but without definite new lesions compared to 12/18/2017. Lumbar spondylosis and degenerative disc disease cause right foraminal impingement all levels between L1 and L5, and left foraminal impingement at L3-4 and L4-5. IMPRESSION: 1. New confluent ground-glass opacities throughout much of the left lung, with patchy ground-glass opacities in the right lung favoring the right lung base. The pattern can be seen setting of asymmetric pulmonary edema, ARDS, atypical pneumonia, acute hypersensitivity, or acute eosinophilic pneumonia. 2. Diffuse confluent metastatic disease throughout the liver, increased from 02/22/2018. 3. Similar  distribution of lesions but increased sclerosis in the widespread osseous metastatic disease. This increased sclerosis is nonspecific, and can sometimes indicate effective response to therapy rather than worsening. 4. Mild wall thickening in the proximal sigmoid colon, suggesting mild nonspecific colitis. 5. Other imaging findings of potential clinical significance: Fibrin sheath are minimal chronic thrombus along the internal jugular portion of the right IJ Port-A-Cath. Aortic Atherosclerosis (ICD10-I70.0). Coronary atherosclerosis. Small type 1 hiatal hernia. Esophageal contrast medium suggest reflux or dysmotility. Trace left pleural effusion. Lingular scarring. Mild to moderate ascites. Multilevel lumbar impingement. Electronically Signed   By: Van Clines M.D.   On: 05/20/2018 16:32   Mr Jeri Cos RU Contrast  Result Date: 06/03/2018 CLINICAL DATA:  68 year old female with metastatic breast cancer. Progressive weakness and fatigue. EXAM: MRI HEAD WITHOUT AND WITH CONTRAST TECHNIQUE: Multiplanar, multiecho pulse sequences of the brain and surrounding structures were obtained without and with intravenous contrast. CONTRAST:  7 milliliters Gadavist COMPARISON:  Brain MRI 12/21/2017 and earlier. FINDINGS: Brain: Stable cerebral volume. No restricted diffusion to suggest acute infarction. No midline shift, mass effect, evidence of mass lesion, ventriculomegaly, extra-axial collection or acute intracranial hemorrhage. Cervicomedullary junction and pituitary are within normal limits. Stable gray-white matter differentiation throughout the brain. Scattered mild for age nonspecific cerebral white matter T2 and FLAIR hyperintensity. No cortical encephalomalacia or chronic cerebral blood products identified. No abnormal intra-axial enhancement. No dural thickening identified. Vascular: Major intracranial vascular flow voids are stable. The right vertebral artery is dominant. The major dural venous sinuses are  enhancing and appear to be patent. Skull and upper cervical spine: Generalized abnormal diffusion in the calvarium and progressed abnormal T1 bone marrow signal compatible with widespread osseous metastatic disease. Progressed clival metastasis, bilateral occipital condyle involvement, and progressed and diffuse visible cervical vertebral involvement. Negative visible spinal cord. Sinuses/Orbits: Stable with evidence of chronic left lamina papyracea fracture. Other: Mastoids remain clear. Visible internal auditory structures appear normal. Scalp and face soft tissues appear negative. IMPRESSION: 1. Progressed and fairly diffuse osseous metastatic disease since December 2019. 2. Stable brain with no metastatic disease to the brain parenchyma or the dura identified. Electronically Signed   By: Genevie Ann M.D.   On: 06/03/2018 23:35   Ct Abdomen Pelvis W Contrast  Result Date: 05/20/2018 CLINICAL DATA:  Metastatic breast cancer restaging EXAM: CT CHEST, ABDOMEN, AND PELVIS WITH CONTRAST TECHNIQUE: Multidetector CT  imaging of the chest, abdomen and pelvis was performed following the standard protocol during bolus administration of intravenous contrast. CONTRAST:  43m OMNIPAQUE IOHEXOL 300 MG/ML  SOLN COMPARISON:  Multiple exams, including chest CT of 03/03/2018, and abdomen MRI 02/22/2018 FINDINGS: Streak artifact related to the patient's right shoulder prosthesis and right arm positioning noted. CT CHEST FINDINGS Cardiovascular: Right Port-A-Cath tip: Cavoatrial junction. Small amount of fibrin sheath or possibly minimal chronic thrombus along the catheter in the right internal jugular vein for example on image 8/2. Atherosclerotic calcification of the aortic arch and left anterior descending coronary artery with mild cardiomegaly. Mediastinum/Nodes: Small type 1 hiatal hernia. Contrast medium in the esophagus. Lungs/Pleura: Confluent ground-glass opacities primarily in the left lung, and with lesser involvement of  the right lung, mostly in the basilar portion of the right lower lobe. Stable lingular scarring. Trace left pleural effusion. Musculoskeletal: Widespread osseous metastatic disease with scattered irregular sclerosis in all vertebral levels in the thoracic spine scattered sclerotic lesions in the ribs and sternum as well as the right scapula. Overall sclerosis appears mildly increased from 03/03/2018. CT ABDOMEN PELVIS FINDINGS Hepatobiliary: Diffuse confluent metastatic disease throughout the liver compatible with increasing confluence of the widespread extensive hepatic metastatic disease. Mildly contracted gallbladder. No biliary dilatation. Pancreas: Unremarkable Spleen: Unremarkable Adrenals/Urinary Tract: Left renal peripelvic cysts. Excreted contrast medium is present in the collecting systems in urinary bladder on initial images, reducing sensitivity for nonobstructive calculi. No hydronephrosis or renal mass identified. The adrenal glands appear unremarkable. Stomach/Bowel: Apparent wall thickening in the gastric cardia may be artifactual. There are a few sigmoid colon diverticula. Mild wall thickening in the proximal sigmoid colon for example on images 91 through 101 of series 2. Vascular/Lymphatic: Aortoiliac atherosclerotic vascular disease. Reproductive: Unremarkable Other: Perihepatic, right paracolic gutter, and right pelvic ascites, overall mild to moderate in volume. Musculoskeletal: Widespread osseous metastatic disease in the lumbar spine and bony pelvis, slightly more sclerotic but without definite new lesions compared to 12/18/2017. Lumbar spondylosis and degenerative disc disease cause right foraminal impingement all levels between L1 and L5, and left foraminal impingement at L3-4 and L4-5. IMPRESSION: 1. New confluent ground-glass opacities throughout much of the left lung, with patchy ground-glass opacities in the right lung favoring the right lung base. The pattern can be seen setting of  asymmetric pulmonary edema, ARDS, atypical pneumonia, acute hypersensitivity, or acute eosinophilic pneumonia. 2. Diffuse confluent metastatic disease throughout the liver, increased from 02/22/2018. 3. Similar distribution of lesions but increased sclerosis in the widespread osseous metastatic disease. This increased sclerosis is nonspecific, and can sometimes indicate effective response to therapy rather than worsening. 4. Mild wall thickening in the proximal sigmoid colon, suggesting mild nonspecific colitis. 5. Other imaging findings of potential clinical significance: Fibrin sheath are minimal chronic thrombus along the internal jugular portion of the right IJ Port-A-Cath. Aortic Atherosclerosis (ICD10-I70.0). Coronary atherosclerosis. Small type 1 hiatal hernia. Esophageal contrast medium suggest reflux or dysmotility. Trace left pleural effusion. Lingular scarring. Mild to moderate ascites. Multilevel lumbar impingement. Electronically Signed   By: WVan ClinesM.D.   On: 05/20/2018 16:32   Dg SCarlena HurlOp Medicare Speech Path  Result Date: 05/29/2018 CLINICAL DATA:  Dysphagia. History of metastatic breast cancer. Concern for aspiration. EXAM: MODIFIED BARIUM SWALLOW TECHNIQUE: Different consistencies of barium were administered orally to the patient by the Speech Pathologist. Imaging of the pharynx was performed in the lateral projection. The radiologist was present in the fluoroscopy room for this study, providing personal supervision. FLUOROSCOPY TIME:  Fluoroscopy Time:  54 seconds Radiation Exposure Index (if provided by the fluoroscopic device): 2.6 mGy Number of Acquired Spot Images: 0 COMPARISON:  None. FINDINGS: No aspiration identified. IMPRESSION: Negative for aspiration. Please refer to the Speech Pathologists report for complete details and recommendations. Electronically Signed   By: Kerby Moors M.D.   On: 05/29/2018 12:49    ASSESSMENT:  Progressive ER/PR positive, HER-2  negative with metastatic disease in liver and bone.  PLAN:    1.  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  Hospice and end-of-life care have been discussed on multiple occasions, but patient declined and wished to try additional chemotherapy.  She expressed understanding that given her performance status, there is likely no additional options after trying single agent gemcitabine.  Given patient's persistent pancytopenia, she will likely only be able to tolerate treatment every other week.  Proceed with cycle 1, day 1 of treatment today.  Return to clinic in 1 week for further evaluation.   2.  Anemia: Patient's hemoglobin improved to 8.3 today.  She does not require blood transfusion. 3.  Peripheral neuropathy: Patient reports mild improvement and is now down to gabapentin twice per day.  4.  Back pain: MRI results from May 04, 2017 did not reveal metastatic disease.  Continue symptomatic treatment.  5.  Left IJ clot: Ultrasound results reviewed independently.  Patient has been instructed to discontinue Eliquis. 6.  Neutropenia: Resolved. Secondary to [place].  Patient may require Neulasta with each treatment. 7.  Dental complaints: Patient has been instructed if she needs dental intervention, to proceed as scheduled per her dentist.  Any invasive procedures likely will need to be timed several days prior to day 1 of her next cycle. 8.  Memory complaints/confusion: Repeat MRI of the brain on Jun 03, 2018 reviewed independently and reported as above with no obvious intracranial abnormality.  Patient continues to have progressive and persistent osseous metastatic disease. 9.  Elevated liver enzymes: AST and ALT remain mildly elevated.  Gemcitabine does not need to be dose reduced in the setting of liver dysfunction. 10.   Poor appetite: Patient has tried Remeron and dexamethasone without much effect.  She only took 2 days of Megace prior to discontinuing.  Patient does not wish to retry Megace at this time.  Appreciate dietary input.   11.  Insomnia: Improved.  Continue Xanax as prescribed. 12.  Swallowing: Barium swallow study reported as normal with no evidence of aspiration.  Continue to monitor. 13.  Bilateral lung opacities: Patient has completed a 14-day course of Levaquin.  Patient expressed understanding and was in agreement with this plan. She also understands that She can call clinic at any time with any questions, concerns, or complaints.   Breast cancer   Staging form: Breast, AJCC 7th Edition     Pathologic stage from 08/11/2014: Stage IIIA (T0, N2a, cM0) - Signed by Lloyd Huger, MD on 08/11/2014   Lloyd Huger, MD   06/06/2018 6:58 AM

## 2018-06-03 ENCOUNTER — Other Ambulatory Visit: Payer: Self-pay

## 2018-06-03 ENCOUNTER — Ambulatory Visit
Admission: RE | Admit: 2018-06-03 | Discharge: 2018-06-03 | Disposition: A | Discharge status: Home / Self Care | Payer: Medicare Other | Source: Ambulatory Visit | Attending: Oncology | Admitting: Oncology

## 2018-06-03 DIAGNOSIS — C50919 Malignant neoplasm of unspecified site of unspecified female breast: Secondary | ICD-10-CM | POA: Insufficient documentation

## 2018-06-03 DIAGNOSIS — R531 Weakness: Secondary | ICD-10-CM | POA: Diagnosis not present

## 2018-06-03 DIAGNOSIS — R5383 Other fatigue: Secondary | ICD-10-CM | POA: Diagnosis not present

## 2018-06-03 MED ORDER — GADOBUTROL 1 MMOL/ML IV SOLN
7.0000 mL | Freq: Once | INTRAVENOUS | Status: AC | PRN
  Administered 2018-06-03: 7 mL via INTRAVENOUS

## 2018-06-04 ENCOUNTER — Inpatient Hospital Stay: Payer: Medicare Other

## 2018-06-04 ENCOUNTER — Inpatient Hospital Stay (HOSPITAL_BASED_OUTPATIENT_CLINIC_OR_DEPARTMENT_OTHER): Payer: Medicare Other | Admitting: Oncology

## 2018-06-04 ENCOUNTER — Other Ambulatory Visit: Payer: Self-pay

## 2018-06-04 VITALS — HR 96

## 2018-06-04 VITALS — BP 119/75 | HR 113 | Temp 96.9°F | Resp 16 | Ht 64.0 in | Wt 150.0 lb

## 2018-06-04 DIAGNOSIS — C7951 Secondary malignant neoplasm of bone: Secondary | ICD-10-CM | POA: Diagnosis not present

## 2018-06-04 DIAGNOSIS — R945 Abnormal results of liver function studies: Secondary | ICD-10-CM

## 2018-06-04 DIAGNOSIS — R05 Cough: Secondary | ICD-10-CM | POA: Diagnosis not present

## 2018-06-04 DIAGNOSIS — G620 Drug-induced polyneuropathy: Secondary | ICD-10-CM | POA: Diagnosis not present

## 2018-06-04 DIAGNOSIS — C787 Secondary malignant neoplasm of liver and intrahepatic bile duct: Secondary | ICD-10-CM | POA: Diagnosis not present

## 2018-06-04 DIAGNOSIS — R5383 Other fatigue: Secondary | ICD-10-CM

## 2018-06-04 DIAGNOSIS — R531 Weakness: Secondary | ICD-10-CM

## 2018-06-04 DIAGNOSIS — C50919 Malignant neoplasm of unspecified site of unspecified female breast: Secondary | ICD-10-CM

## 2018-06-04 DIAGNOSIS — G4700 Insomnia, unspecified: Secondary | ICD-10-CM

## 2018-06-04 DIAGNOSIS — R630 Anorexia: Secondary | ICD-10-CM | POA: Diagnosis not present

## 2018-06-04 DIAGNOSIS — D649 Anemia, unspecified: Secondary | ICD-10-CM | POA: Diagnosis not present

## 2018-06-04 DIAGNOSIS — Z5111 Encounter for antineoplastic chemotherapy: Secondary | ICD-10-CM | POA: Diagnosis not present

## 2018-06-04 DIAGNOSIS — Z95828 Presence of other vascular implants and grafts: Secondary | ICD-10-CM

## 2018-06-04 LAB — SAMPLE TO BLOOD BANK

## 2018-06-04 LAB — CBC WITH DIFFERENTIAL/PLATELET
Abs Immature Granulocytes: 0.42 10*3/uL — ABNORMAL HIGH (ref 0.00–0.07)
Basophils Absolute: 0.1 10*3/uL (ref 0.0–0.1)
Basophils Relative: 1 %
Eosinophils Absolute: 0 10*3/uL (ref 0.0–0.5)
Eosinophils Relative: 0 %
HCT: 27.1 % — ABNORMAL LOW (ref 36.0–46.0)
Hemoglobin: 8.3 g/dL — ABNORMAL LOW (ref 12.0–15.0)
Immature Granulocytes: 5 %
Lymphocytes Relative: 7 %
Lymphs Abs: 0.7 10*3/uL (ref 0.7–4.0)
MCH: 30 pg (ref 26.0–34.0)
MCHC: 30.6 g/dL (ref 30.0–36.0)
MCV: 97.8 fL (ref 80.0–100.0)
Monocytes Absolute: 1.2 10*3/uL — ABNORMAL HIGH (ref 0.1–1.0)
Monocytes Relative: 13 %
Neutro Abs: 6.6 10*3/uL (ref 1.7–7.7)
Neutrophils Relative %: 74 %
Platelets: 220 10*3/uL (ref 150–400)
RBC: 2.77 MIL/uL — ABNORMAL LOW (ref 3.87–5.11)
RDW: 21.2 % — ABNORMAL HIGH (ref 11.5–15.5)
WBC: 8.9 10*3/uL (ref 4.0–10.5)
nRBC: 0.5 % — ABNORMAL HIGH (ref 0.0–0.2)

## 2018-06-04 LAB — COMPREHENSIVE METABOLIC PANEL
ALT: 47 U/L — ABNORMAL HIGH (ref 0–44)
AST: 101 U/L — ABNORMAL HIGH (ref 15–41)
Albumin: 3 g/dL — ABNORMAL LOW (ref 3.5–5.0)
Alkaline Phosphatase: 186 U/L — ABNORMAL HIGH (ref 38–126)
Anion gap: 9 (ref 5–15)
BUN: 23 mg/dL (ref 8–23)
CO2: 24 mmol/L (ref 22–32)
Calcium: 8 mg/dL — ABNORMAL LOW (ref 8.9–10.3)
Chloride: 105 mmol/L (ref 98–111)
Creatinine, Ser: 0.89 mg/dL (ref 0.44–1.00)
GFR calc Af Amer: >60 mL/min (ref >60)
GFR calc non Af Amer: >60 mL/min (ref >60)
Glucose, Bld: 183 mg/dL — ABNORMAL HIGH (ref 70–99)
Potassium: 4.2 mmol/L (ref 3.5–5.1)
Sodium: 138 mmol/L (ref 135–145)
Total Bilirubin: 0.7 mg/dL (ref 0.3–1.2)
Total Protein: 5.8 g/dL — ABNORMAL LOW (ref 6.5–8.1)

## 2018-06-04 MED ORDER — PROCHLORPERAZINE MALEATE 10 MG PO TABS
10.0000 mg | ORAL_TABLET | Freq: Once | ORAL | Status: AC
  Administered 2018-06-04: 10 mg via ORAL
  Filled 2018-06-04: qty 1

## 2018-06-04 MED ORDER — SODIUM CHLORIDE 0.9 % IV SOLN
Freq: Once | INTRAVENOUS | Status: AC
  Administered 2018-06-04: 11:00:00 via INTRAVENOUS
  Filled 2018-06-04: qty 250

## 2018-06-04 MED ORDER — HEPARIN SOD (PORK) LOCK FLUSH 100 UNIT/ML IV SOLN
500.0000 [IU] | Freq: Once | INTRAVENOUS | Status: AC | PRN
  Administered 2018-06-04: 12:00:00 500 [IU]
  Filled 2018-06-04: qty 5

## 2018-06-04 MED ORDER — SODIUM CHLORIDE 0.9 % IV SOLN
1400.0000 mg | Freq: Once | INTRAVENOUS | Status: AC
  Administered 2018-06-04: 1400 mg via INTRAVENOUS
  Filled 2018-06-04: qty 26.3

## 2018-06-04 MED ORDER — SODIUM CHLORIDE 0.9% FLUSH
10.0000 mL | Freq: Once | INTRAVENOUS | Status: AC
  Administered 2018-06-04: 10:00:00 10 mL via INTRAVENOUS
  Filled 2018-06-04: qty 10

## 2018-06-10 NOTE — Progress Notes (Signed)
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  INTERVAL HISTORY: Patient returns to clinic today for further evaluation and to assess her toleration of gemcitabine.  She continues to have significant weakness and fatigue as well as a decreased performance status.  She continues to have symptoms of aspiration despite a normal swallow study recently.  She continues to have a poor appetite.  Her insomnia has improved.  She has a chronic peripheral neuropathy which is slightly improved, but no other neurologic complaints.  She denies any recent fevers or illnesses.  She denies any chest pain, shortness of breath or hemoptysis. She denies any nausea, vomiting, constipation, or diarrhea. She has no urinary complaints.  Patient feels generally terrible, but offers no further specific complaints today.  REVIEW OF SYSTEMS:   Review of Systems  Constitutional: Positive for malaise/fatigue and weight loss. Negative for fever.  Eyes: Negative.  Negative for blurred vision, double vision and pain.  Respiratory: Positive for cough. Negative for shortness of breath.   Cardiovascular: Negative.  Negative for chest pain and leg swelling.  Gastrointestinal: Negative.  Negative for abdominal pain.  Genitourinary: Negative.  Negative for dysuria and flank pain.  Musculoskeletal: Negative for back pain and falls.  Skin: Negative.  Negative for rash.  Neurological: Positive for tingling, sensory change and weakness. Negative for focal weakness and headaches.  Endo/Heme/Allergies: Negative.   Psychiatric/Behavioral: Positive for memory loss. The patient is not nervous/anxious  and does not have insomnia.     As per HPI. Otherwise, a complete review of systems is negative.  PAST MEDICAL HISTORY: Past Medical History:  Diagnosis Date   Anxiety    Back pain    occasionally   Breast cancer ([place]) 2009   left   Depression    takes Paxil and Wellbutrin daily   Diabetes ([place])    takes Metformin daily   type 2    Genetic testing 02/03/2017   Multi-Cancer panel (83 genes) @ [facility] - No pathogenic mutations detected   GERD (gastroesophageal reflux disease)    occasional   History of bronchitis 2 yrs ago   History of kidney stones    Hyperlipidemia    takes Atorvastatin daily   Hypertension    no meds   Insomnia    takes Ambien nightly   Insomnia    takes gabapentin nightly   Joint pain    Mood swings    Osteoarthritis of knee    Personal history of chemotherapy 12/05/2016   Mets from Breast Cancer   Personal history of radiation therapy 11/2016   Pneumonia    PONV (postoperative nausea and vomiting)    Seasonal allergies    takes Allegra daily    PAST SURGICAL HISTORY: Past Surgical History:  Procedure Laterality Date   BREAST BIOPSY  2009   cataract surgery Bilateral    COLONOSCOPY     IR RADIOLOGIST EVAL & MGMT  01/29/2018   JOINT REPLACEMENT Left 2014   knee   KNEE ARTHROSCOPY Left    x 5   MASTECTOMY Left    port a cath placed     PORTACATH PLACEMENT N/A 09/17/2015   Procedure: INSERTION PORT-A-CATH;  Surgeon: Jules Husbands, MD;  Location: [facility];  Service: General;  Laterality: N/A;   RADIOLOGY WITH ANESTHESIA N/A 02/20/2018   Procedure: CT WITH ANESTHESIA MICROWAVE THERMAL ABLATION-LIVER;  Surgeon: Jacqulynn Cadet, MD;  Location: [facility];  Service: Anesthesiology;  Laterality: N/A;   TOOTH EXTRACTION     TOTAL SHOULDER ARTHROPLASTY Right 06/03/2015   Procedure: TOTAL SHOULDER ARTHROPLASTY;  Surgeon: Tania Ade, MD;  Location: [facility];  Service: Orthopedics;  Laterality: Right;  Right total shoulder  arthroplasty   TOTAL SHOULDER REPLACEMENT Right 06/03/2015   TUBAL LIGATION      FAMILY HISTORY: Father with non-Hodgkin's lymphoma, 2 paternal aunts with breast cancer.     ADVANCED DIRECTIVES:    HEALTH MAINTENANCE: Social History   Tobacco Use   Smoking status: Never Smoker   Smokeless tobacco: Never Used  Substance Use Topics   Alcohol use: Yes    Alcohol/week: 0.0 standard drinks    Comment: occasionally wine   Drug use: Yes    Types: Marijuana    Comment: cannabis with no extra  low dose edibles  for neuropathy     Colonoscopy:  PAP:  Bone density:  Lipid panel:  Allergies  Allergen Reactions   Ace Inhibitors Cough   Latex Itching   Morphine And Related Itching    Caused her to itch terribly. Would prefer if given to take with a benadryl   Penicillins Rash    Has patient had a PCN reaction causing immediate rash, facial/tongue/throat swelling, SOB or lightheadedness with hypotension: No Has patient had a PCN reaction causing severe rash involving mucus membranes or skin necrosis: No Has patient had a PCN reaction that required hospitalization No Has patient had a PCN reaction occurring within the last 10 years: No If all of the above answers are "NO", then may proceed with Cephalosporin use.    Current Outpatient Medications  Medication Sig Dispense Refill   acetaminophen (TYLENOL) 500 MG tablet Take 500 mg by mouth every 6 (six) hours as needed for mild pain or moderate pain.      atorvastatin (LIPITOR) 10 MG tablet Take 10 mg by mouth at bedtime.      buPROPion (WELLBUTRIN XL) 150 MG 24 hr tablet Take 150 mg by mouth at bedtime.      Calcium-Magnesium-Vitamin D (CALCIUM 1200+D3 PO) Take 1 tablet by mouth daily.      Cyanocobalamin 5000 MCG CAPS Take 5,000 mcg by mouth daily.      fexofenadine (ALLEGRA) 180 MG tablet Take 180 mg by mouth at bedtime.      gabapentin (NEURONTIN) 300 MG capsule Take 300-600 mg by mouth 2 (two) times daily.  Take 300 mg by mouth in the morning and 382m at night     levofloxacin (LEVAQUIN) 500 MG tablet Take 1 tablet (500 mg total) by mouth daily. 14 tablet 0   lidocaine-prilocaine (EMLA) cream Apply 1 application topically as needed. Apply to port 1-2 hours prior to chemotherapy appointment. Cover with plastic wrap. 30 g 0   metFORMIN (GLUCOPHAGE) 850 MG tablet Take 850 mg by mouth 2 (two) times daily with a meal.      Oxycodone HCl 10 MG TABS Take 1 tablet (10 mg total) by mouth every 4 (four) hours as needed. Ok to take every 4-6 hours as needed. 90 tablet 0   pantoprazole (PROTONIX) 40 MG tablet Take 40 mg by mouth at bedtime.      PARoxetine (PAXIL) 40 MG tablet Take 40 mg by mouth at bedtime.  prochlorperazine (COMPAZINE) 10 MG tablet Take 1 tablet (10 mg total) by mouth every 6 (six) hours as needed for nausea or vomiting. 30 tablet 3   sodium chloride (MURO 128) 5 % ophthalmic solution Place 1 drop into both eyes 4 (four) times daily.     zolpidem (AMBIEN) 10 MG tablet Take 1 tablet (10 mg total) by mouth at bedtime as needed (sleep). 30 tablet 0   No current facility-administered medications for this visit.     OBJECTIVE: Vitals:   06/11/18 1411  BP: 135/85  Pulse: (!) 116  Temp: (!) 97.5 F (36.4 C)     Body mass index is 25.92 kg/m.    ECOG FS:3 - Symptomatic, >50% confined to bed  General: Thin, ill-appearing, no acute distress.  Sitting in a wheelchair. Eyes: Pink conjunctiva, anicteric sclera. HEENT: Normocephalic, moist mucous membranes. Lungs: Clear to auscultation bilaterally. Heart: Regular rate and rhythm. No rubs, murmurs, or gallops. Abdomen: Soft, nontender, nondistended. No organomegaly noted, normoactive bowel sounds. Musculoskeletal: No edema, cyanosis, or clubbing. Neuro: Alert, answering all questions appropriately. Cranial nerves grossly intact. Skin: No rashes or petechiae noted. Psych: Flat affect.  LAB RESULTS:  Lab Results  Component  Value Date   NA 137 06/11/2018   K 4.6 06/11/2018   CL 106 06/11/2018   CO2 23 06/11/2018   GLUCOSE 165 (H) 06/11/2018   BUN 27 (H) 06/11/2018   CREATININE 0.81 06/11/2018   CALCIUM 8.4 (L) 06/11/2018   PROT 5.5 (L) 06/11/2018   ALBUMIN 2.8 (L) 06/11/2018   AST 92 (H) 06/11/2018   ALT 51 (H) 06/11/2018   ALKPHOS 184 (H) 06/11/2018   BILITOT 0.6 06/11/2018   GFRNONAA >60 06/11/2018   GFRAA >60 06/11/2018    Lab Results  Component Value Date   WBC 7.4 06/11/2018   NEUTROABS 4.8 06/11/2018   HGB 6.5 (L) 06/11/2018   HCT 22.0 (L) 06/11/2018   MCV 99.5 06/11/2018   PLT 152 06/11/2018     STUDIES: Ct Chest W Contrast  Result Date: 05/20/2018 CLINICAL DATA:  Metastatic breast cancer restaging EXAM: CT CHEST, ABDOMEN, AND PELVIS WITH CONTRAST TECHNIQUE: Multidetector CT imaging of the chest, abdomen and pelvis was performed following the standard protocol during bolus administration of intravenous contrast. CONTRAST:  64m OMNIPAQUE IOHEXOL 300 MG/ML  SOLN COMPARISON:  Multiple exams, including chest CT of 03/03/2018, and abdomen MRI 02/22/2018 FINDINGS: Streak artifact related to the patient's right shoulder prosthesis and right arm positioning noted. CT CHEST FINDINGS Cardiovascular: Right Port-A-Cath tip: Cavoatrial junction. Small amount of fibrin sheath or possibly minimal chronic thrombus along the catheter in the right internal jugular vein for example on image 8/2. Atherosclerotic calcification of the aortic arch and left anterior descending coronary artery with mild cardiomegaly. Mediastinum/Nodes: Small type 1 hiatal hernia. Contrast medium in the esophagus. Lungs/Pleura: Confluent ground-glass opacities primarily in the left lung, and with lesser involvement of the right lung, mostly in the basilar portion of the right lower lobe. Stable lingular scarring. Trace left pleural effusion. Musculoskeletal: Widespread osseous metastatic disease with scattered irregular sclerosis in all  vertebral levels in the thoracic spine scattered sclerotic lesions in the ribs and sternum as well as the right scapula. Overall sclerosis appears mildly increased from 03/03/2018. CT ABDOMEN PELVIS FINDINGS Hepatobiliary: Diffuse confluent metastatic disease throughout the liver compatible with increasing confluence of the widespread extensive hepatic metastatic disease. Mildly contracted gallbladder. No biliary dilatation. Pancreas: Unremarkable Spleen: Unremarkable Adrenals/Urinary Tract: Left renal peripelvic cysts. Excreted contrast medium is present  in the collecting systems in urinary bladder on initial images, reducing sensitivity for nonobstructive calculi. No hydronephrosis or renal mass identified. The adrenal glands appear unremarkable. Stomach/Bowel: Apparent wall thickening in the gastric cardia may be artifactual. There are a few sigmoid colon diverticula. Mild wall thickening in the proximal sigmoid colon for example on images 91 through 101 of series 2. Vascular/Lymphatic: Aortoiliac atherosclerotic vascular disease. Reproductive: Unremarkable Other: Perihepatic, right paracolic gutter, and right pelvic ascites, overall mild to moderate in volume. Musculoskeletal: Widespread osseous metastatic disease in the lumbar spine and bony pelvis, slightly more sclerotic but without definite new lesions compared to 12/18/2017. Lumbar spondylosis and degenerative disc disease cause right foraminal impingement all levels between L1 and L5, and left foraminal impingement at L3-4 and L4-5. IMPRESSION: 1. New confluent ground-glass opacities throughout much of the left lung, with patchy ground-glass opacities in the right lung favoring the right lung base. The pattern can be seen setting of asymmetric pulmonary edema, ARDS, atypical pneumonia, acute hypersensitivity, or acute eosinophilic pneumonia. 2. Diffuse confluent metastatic disease throughout the liver, increased from 02/22/2018. 3. Similar distribution of  lesions but increased sclerosis in the widespread osseous metastatic disease. This increased sclerosis is nonspecific, and can sometimes indicate effective response to therapy rather than worsening. 4. Mild wall thickening in the proximal sigmoid colon, suggesting mild nonspecific colitis. 5. Other imaging findings of potential clinical significance: Fibrin sheath are minimal chronic thrombus along the internal jugular portion of the right IJ Port-A-Cath. Aortic Atherosclerosis (ICD10-I70.0). Coronary atherosclerosis. Small type 1 hiatal hernia. Esophageal contrast medium suggest reflux or dysmotility. Trace left pleural effusion. Lingular scarring. Mild to moderate ascites. Multilevel lumbar impingement. Electronically Signed   By: Van Clines M.D.   On: 05/20/2018 16:32   Mr Jeri Cos GE Contrast  Result Date: 06/03/2018 CLINICAL DATA:  68 year old female with metastatic breast cancer. Progressive weakness and fatigue. EXAM: MRI HEAD WITHOUT AND WITH CONTRAST TECHNIQUE: Multiplanar, multiecho pulse sequences of the brain and surrounding structures were obtained without and with intravenous contrast. CONTRAST:  7 milliliters Gadavist COMPARISON:  Brain MRI 12/21/2017 and earlier. FINDINGS: Brain: Stable cerebral volume. No restricted diffusion to suggest acute infarction. No midline shift, mass effect, evidence of mass lesion, ventriculomegaly, extra-axial collection or acute intracranial hemorrhage. Cervicomedullary junction and pituitary are within normal limits. Stable gray-white matter differentiation throughout the brain. Scattered mild for age nonspecific cerebral white matter T2 and FLAIR hyperintensity. No cortical encephalomalacia or chronic cerebral blood products identified. No abnormal intra-axial enhancement. No dural thickening identified. Vascular: Major intracranial vascular flow voids are stable. The right vertebral artery is dominant. The major dural venous sinuses are enhancing and  appear to be patent. Skull and upper cervical spine: Generalized abnormal diffusion in the calvarium and progressed abnormal T1 bone marrow signal compatible with widespread osseous metastatic disease. Progressed clival metastasis, bilateral occipital condyle involvement, and progressed and diffuse visible cervical vertebral involvement. Negative visible spinal cord. Sinuses/Orbits: Stable with evidence of chronic left lamina papyracea fracture. Other: Mastoids remain clear. Visible internal auditory structures appear normal. Scalp and face soft tissues appear negative. IMPRESSION: 1. Progressed and fairly diffuse osseous metastatic disease since December 2019. 2. Stable brain with no metastatic disease to the brain parenchyma or the dura identified. Electronically Signed   By: Genevie Ann M.D.   On: 06/03/2018 23:35   Ct Abdomen Pelvis W Contrast  Result Date: 05/20/2018 CLINICAL DATA:  Metastatic breast cancer restaging EXAM: CT CHEST, ABDOMEN, AND PELVIS WITH CONTRAST TECHNIQUE: Multidetector CT imaging of  the chest, abdomen and pelvis was performed following the standard protocol during bolus administration of intravenous contrast. CONTRAST:  73m OMNIPAQUE IOHEXOL 300 MG/ML  SOLN COMPARISON:  Multiple exams, including chest CT of 03/03/2018, and abdomen MRI 02/22/2018 FINDINGS: Streak artifact related to the patient's right shoulder prosthesis and right arm positioning noted. CT CHEST FINDINGS Cardiovascular: Right Port-A-Cath tip: Cavoatrial junction. Small amount of fibrin sheath or possibly minimal chronic thrombus along the catheter in the right internal jugular vein for example on image 8/2. Atherosclerotic calcification of the aortic arch and left anterior descending coronary artery with mild cardiomegaly. Mediastinum/Nodes: Small type 1 hiatal hernia. Contrast medium in the esophagus. Lungs/Pleura: Confluent ground-glass opacities primarily in the left lung, and with lesser involvement of the right lung,  mostly in the basilar portion of the right lower lobe. Stable lingular scarring. Trace left pleural effusion. Musculoskeletal: Widespread osseous metastatic disease with scattered irregular sclerosis in all vertebral levels in the thoracic spine scattered sclerotic lesions in the ribs and sternum as well as the right scapula. Overall sclerosis appears mildly increased from 03/03/2018. CT ABDOMEN PELVIS FINDINGS Hepatobiliary: Diffuse confluent metastatic disease throughout the liver compatible with increasing confluence of the widespread extensive hepatic metastatic disease. Mildly contracted gallbladder. No biliary dilatation. Pancreas: Unremarkable Spleen: Unremarkable Adrenals/Urinary Tract: Left renal peripelvic cysts. Excreted contrast medium is present in the collecting systems in urinary bladder on initial images, reducing sensitivity for nonobstructive calculi. No hydronephrosis or renal mass identified. The adrenal glands appear unremarkable. Stomach/Bowel: Apparent wall thickening in the gastric cardia may be artifactual. There are a few sigmoid colon diverticula. Mild wall thickening in the proximal sigmoid colon for example on images 91 through 101 of series 2. Vascular/Lymphatic: Aortoiliac atherosclerotic vascular disease. Reproductive: Unremarkable Other: Perihepatic, right paracolic gutter, and right pelvic ascites, overall mild to moderate in volume. Musculoskeletal: Widespread osseous metastatic disease in the lumbar spine and bony pelvis, slightly more sclerotic but without definite new lesions compared to 12/18/2017. Lumbar spondylosis and degenerative disc disease cause right foraminal impingement all levels between L1 and L5, and left foraminal impingement at L3-4 and L4-5. IMPRESSION: 1. New confluent ground-glass opacities throughout much of the left lung, with patchy ground-glass opacities in the right lung favoring the right lung base. The pattern can be seen setting of asymmetric pulmonary  edema, ARDS, atypical pneumonia, acute hypersensitivity, or acute eosinophilic pneumonia. 2. Diffuse confluent metastatic disease throughout the liver, increased from 02/22/2018. 3. Similar distribution of lesions but increased sclerosis in the widespread osseous metastatic disease. This increased sclerosis is nonspecific, and can sometimes indicate effective response to therapy rather than worsening. 4. Mild wall thickening in the proximal sigmoid colon, suggesting mild nonspecific colitis. 5. Other imaging findings of potential clinical significance: Fibrin sheath are minimal chronic thrombus along the internal jugular portion of the right IJ Port-A-Cath. Aortic Atherosclerosis (ICD10-I70.0). Coronary atherosclerosis. Small type 1 hiatal hernia. Esophageal contrast medium suggest reflux or dysmotility. Trace left pleural effusion. Lingular scarring. Mild to moderate ascites. Multilevel lumbar impingement. Electronically Signed   By: WVan ClinesM.D.   On: 05/20/2018 16:32   Dg SCarlena HurlOp Medicare Speech Path  Result Date: 05/29/2018 CLINICAL DATA:  Dysphagia. History of metastatic breast cancer. Concern for aspiration. EXAM: MODIFIED BARIUM SWALLOW TECHNIQUE: Different consistencies of barium were administered orally to the patient by the Speech Pathologist. Imaging of the pharynx was performed in the lateral projection. The radiologist was present in the fluoroscopy room for this study, providing personal supervision. FLUOROSCOPY TIME:  Fluoroscopy  Time:  54 seconds Radiation Exposure Index (if provided by the fluoroscopic device): 2.6 mGy Number of Acquired Spot Images: 0 COMPARISON:  None. FINDINGS: No aspiration identified. IMPRESSION: Negative for aspiration. Please refer to the Speech Pathologists report for complete details and recommendations. Electronically Signed   By: Kerby Moors M.D.   On: 05/29/2018 12:49    ASSESSMENT:  Progressive ER/PR positive, HER-2 negative with metastatic  disease in liver and bone.  PLAN:    1.  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  Hospice and end-of-life care have been discussed on multiple occasions, but patient declined and wished to try additional chemotherapy.  She expressed understanding that given her performance status, there is likely no additional options after trying single agent gemcitabine.  Given patient's persistent pancytopenia, she will likely only be able to tolerate treatment every other week.  Patient received cycle 1, day 1 of treatment last week and tolerated it relatively well.  Return to clinic in 1 week for further evaluation and consideration of cycle 1, day 15.  2.  Anemia: Patient's hemoglobin significantly declined to 6.5.  Proceed with 1 unit of packed red blood cells on Friday. 3.  Peripheral neuropathy: Patient reports mild improvement and is now down to gabapentin twice per day.  4.  Back pain: MRI results from May 04, 2017 did not reveal metastatic disease.  Continue symptomatic treatment.  5.  Left IJ clot: Ultrasound results reviewed independently.  Patient has been instructed to discontinue Eliquis. 6.  Neutropenia: Resolved. 7.  Memory complaints/confusion: Repeat MRI of the brain on Jun 03, 2018 reviewed independently with no obvious intracranial abnormality.  Patient continues to have progressive and persistent osseous metastatic disease. 8.  Elevated liver enzymes: Chronic and unchanged.  Gemcitabine does not need to be dose reduced in the setting of liver dysfunction. 9.  Apparent aspiration: Despite a recent normal barium swallow, patient still describes symptoms of significant aspiration with eating.  A referral has been sent to speech pathology for further evaluation. 10.  Poor appetite: Patient has tried Remeron and  dexamethasone without much effect.  She only took 2 days of Megace prior to discontinuing.  Patient does not wish to retry Megace at this time.  Appreciate dietary input.   11.  Insomnia: Improved.  Continue Xanax as prescribed. 12.  Bilateral lung opacities: Patient has completed a 14-day course of Levaquin.  Patient expressed understanding and was in agreement with this plan. She also understands that She can call clinic at any time with any questions, concerns, or complaints.   Breast cancer   Staging form: Breast, AJCC 7th Edition     Pathologic stage from 08/11/2014: Stage IIIA (T0, N2a, cM0) - Signed by Lloyd Huger, MD on 08/11/2014   Lloyd Huger, MD   06/12/2018 2:17 PM

## 2018-06-11 ENCOUNTER — Encounter: Payer: Self-pay | Admitting: Oncology

## 2018-06-11 ENCOUNTER — Inpatient Hospital Stay: Payer: Medicare Other

## 2018-06-11 ENCOUNTER — Inpatient Hospital Stay (HOSPITAL_BASED_OUTPATIENT_CLINIC_OR_DEPARTMENT_OTHER): Payer: Medicare Other | Admitting: Oncology

## 2018-06-11 ENCOUNTER — Inpatient Hospital Stay (HOSPITAL_BASED_OUTPATIENT_CLINIC_OR_DEPARTMENT_OTHER): Payer: Medicare Other | Admitting: Hospice and Palliative Medicine

## 2018-06-11 ENCOUNTER — Other Ambulatory Visit: Payer: Self-pay

## 2018-06-11 VITALS — BP 135/85 | HR 116 | Temp 97.5°F | Ht 64.0 in | Wt 151.0 lb

## 2018-06-11 DIAGNOSIS — D649 Anemia, unspecified: Secondary | ICD-10-CM | POA: Diagnosis not present

## 2018-06-11 DIAGNOSIS — C50919 Malignant neoplasm of unspecified site of unspecified female breast: Secondary | ICD-10-CM | POA: Diagnosis not present

## 2018-06-11 DIAGNOSIS — C787 Secondary malignant neoplasm of liver and intrahepatic bile duct: Secondary | ICD-10-CM

## 2018-06-11 DIAGNOSIS — R634 Abnormal weight loss: Secondary | ICD-10-CM | POA: Diagnosis not present

## 2018-06-11 DIAGNOSIS — R531 Weakness: Secondary | ICD-10-CM

## 2018-06-11 DIAGNOSIS — R5383 Other fatigue: Secondary | ICD-10-CM

## 2018-06-11 DIAGNOSIS — R945 Abnormal results of liver function studies: Secondary | ICD-10-CM

## 2018-06-11 DIAGNOSIS — G4700 Insomnia, unspecified: Secondary | ICD-10-CM | POA: Diagnosis not present

## 2018-06-11 DIAGNOSIS — R4182 Altered mental status, unspecified: Secondary | ICD-10-CM

## 2018-06-11 DIAGNOSIS — Z515 Encounter for palliative care: Secondary | ICD-10-CM

## 2018-06-11 DIAGNOSIS — G620 Drug-induced polyneuropathy: Secondary | ICD-10-CM

## 2018-06-11 DIAGNOSIS — R630 Anorexia: Secondary | ICD-10-CM

## 2018-06-11 DIAGNOSIS — R1310 Dysphagia, unspecified: Secondary | ICD-10-CM

## 2018-06-11 DIAGNOSIS — E46 Unspecified protein-calorie malnutrition: Secondary | ICD-10-CM

## 2018-06-11 DIAGNOSIS — C7951 Secondary malignant neoplasm of bone: Secondary | ICD-10-CM

## 2018-06-11 DIAGNOSIS — Z5111 Encounter for antineoplastic chemotherapy: Secondary | ICD-10-CM | POA: Diagnosis not present

## 2018-06-11 LAB — COMPREHENSIVE METABOLIC PANEL
ALT: 51 U/L — ABNORMAL HIGH (ref 0–44)
AST: 92 U/L — ABNORMAL HIGH (ref 15–41)
Albumin: 2.8 g/dL — ABNORMAL LOW (ref 3.5–5.0)
Alkaline Phosphatase: 184 U/L — ABNORMAL HIGH (ref 38–126)
Anion gap: 8 (ref 5–15)
BUN: 27 mg/dL — ABNORMAL HIGH (ref 8–23)
CO2: 23 mmol/L (ref 22–32)
Calcium: 8.4 mg/dL — ABNORMAL LOW (ref 8.9–10.3)
Chloride: 106 mmol/L (ref 98–111)
Creatinine, Ser: 0.81 mg/dL (ref 0.44–1.00)
GFR calc Af Amer: >60 mL/min (ref >60)
GFR calc non Af Amer: >60 mL/min (ref >60)
Glucose, Bld: 165 mg/dL — ABNORMAL HIGH (ref 70–99)
Potassium: 4.6 mmol/L (ref 3.5–5.1)
Sodium: 137 mmol/L (ref 135–145)
Total Bilirubin: 0.6 mg/dL (ref 0.3–1.2)
Total Protein: 5.5 g/dL — ABNORMAL LOW (ref 6.5–8.1)

## 2018-06-11 LAB — CBC WITH DIFFERENTIAL/PLATELET
Abs Immature Granulocytes: 0.5 10*3/uL — ABNORMAL HIGH (ref 0.00–0.07)
Basophils Absolute: 0 10*3/uL (ref 0.0–0.1)
Basophils Relative: 0 %
Eosinophils Absolute: 0 10*3/uL (ref 0.0–0.5)
Eosinophils Relative: 1 %
HCT: 22 % — ABNORMAL LOW (ref 36.0–46.0)
Hemoglobin: 6.5 g/dL — ABNORMAL LOW (ref 12.0–15.0)
Immature Granulocytes: 7 %
Lymphocytes Relative: 11 %
Lymphs Abs: 0.8 10*3/uL (ref 0.7–4.0)
MCH: 29.4 pg (ref 26.0–34.0)
MCHC: 29.5 g/dL — ABNORMAL LOW (ref 30.0–36.0)
MCV: 99.5 fL (ref 80.0–100.0)
Monocytes Absolute: 1.3 10*3/uL — ABNORMAL HIGH (ref 0.1–1.0)
Monocytes Relative: 17 %
Neutro Abs: 4.8 10*3/uL (ref 1.7–7.7)
Neutrophils Relative %: 64 %
Platelets: 152 10*3/uL (ref 150–400)
RBC: 2.21 MIL/uL — ABNORMAL LOW (ref 3.87–5.11)
RDW: 20.7 % — ABNORMAL HIGH (ref 11.5–15.5)
WBC: 7.4 10*3/uL (ref 4.0–10.5)
nRBC: 1.7 % — ABNORMAL HIGH (ref 0.0–0.2)

## 2018-06-11 LAB — SAMPLE TO BLOOD BANK

## 2018-06-11 MED ORDER — ZOLPIDEM TARTRATE 10 MG PO TABS: 10.0000 mg | ORAL_TABLET | Freq: Every evening | ORAL | 0 refills | Status: DC | PRN

## 2018-06-11 NOTE — Progress Notes (Signed)
Patient stated that she continues to struggle with her appetite but tries to eat. Patient would like a refill on her Ambien since it helps her sleep at night time. Patient denied fever, chills, nausea, vomiting, constipation or diarrhea.

## 2018-06-12 ENCOUNTER — Inpatient Hospital Stay: Payer: Medicare Other | Admitting: Hospice and Palliative Medicine

## 2018-06-12 ENCOUNTER — Other Ambulatory Visit: Payer: Self-pay | Admitting: Oncology

## 2018-06-12 ENCOUNTER — Other Ambulatory Visit: Payer: Self-pay

## 2018-06-12 DIAGNOSIS — D649 Anemia, unspecified: Secondary | ICD-10-CM

## 2018-06-12 LAB — PREPARE RBC (CROSSMATCH)

## 2018-06-12 NOTE — Progress Notes (Signed)
[place]  [phone] [phone]   Name: Amanda [name] Date: 06/12/2018 MRN: [id number]  DOB: 1950/09/07  Patient Care Team: Sofie Hartigan, MD as PCP - General (Family Medicine) Dahlia Byes, Marjory Lies, MD as Consulting Physician (General Surgery)    REASON FOR CONSULTATION: Palliative Care consult requested for this 68 y.o. 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  She was found to have disease progression on Ibrance and letrozole.  Patient previously had verbalized a desire not to pursue further treatment in the event of disease progression.  However, she is decided to start [place].  Palliative care was consulted to help address goals and follow for support.  SOCIAL HISTORY:    Patient lives at home with her husband.  She has a son and stepson who are involved.  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  Her husband owns a "ozone" business.  ADVANCE DIRECTIVES:  Not on file. Has HCPOA and living will at home.   CODE STATUS: DNR  PAST MEDICAL HISTORY: Past Medical History:  Diagnosis Date   Anxiety    Back pain    occasionally   Breast cancer ([place]) 2009   left   Depression    takes Paxil and Wellbutrin daily   Diabetes ([place])    takes Metformin daily   type 2    Genetic testing 02/03/2017   Multi-Cancer panel (83 genes) @ [facility] - No pathogenic mutations detected   GERD (gastroesophageal reflux disease)    occasional   History of bronchitis 2 yrs ago   History of kidney stones    Hyperlipidemia    takes Atorvastatin daily   Hypertension    no meds   Insomnia    takes Ambien nightly   Insomnia    takes gabapentin nightly   Joint pain    Mood swings    Osteoarthritis of knee    Personal history of chemotherapy 12/05/2016   Mets from Breast Cancer   Personal history of radiation therapy 11/2016    Pneumonia    PONV (postoperative nausea and vomiting)    Seasonal allergies    takes Allegra daily    PAST SURGICAL HISTORY:  Past Surgical History:  Procedure Laterality Date   BREAST BIOPSY  2009   cataract surgery Bilateral    COLONOSCOPY     IR RADIOLOGIST EVAL & MGMT  01/29/2018   JOINT REPLACEMENT Left 2014   knee   KNEE ARTHROSCOPY Left    x 5   MASTECTOMY Left    port a cath placed     PORTACATH PLACEMENT N/A 09/17/2015   Procedure: INSERTION PORT-A-CATH;  Surgeon: Jules Husbands, MD;  Location: [facility] ORS;  Service: General;  Laterality: N/A;   RADIOLOGY WITH ANESTHESIA N/A 02/20/2018   Procedure: CT WITH ANESTHESIA MICROWAVE THERMAL ABLATION-LIVER;  Surgeon: Jacqulynn Cadet, MD;  Location: [facility];  Service: Anesthesiology;  Laterality: N/A;   TOOTH EXTRACTION     TOTAL SHOULDER ARTHROPLASTY Right 06/03/2015   Procedure: TOTAL SHOULDER ARTHROPLASTY;  Surgeon: Tania Ade, MD;  Location: [facility];  Service: Orthopedics;  Laterality: Right;  Right total shoulder arthroplasty   TOTAL SHOULDER REPLACEMENT Right 06/03/2015   TUBAL LIGATION      HEMATOLOGY/ONCOLOGY HISTORY:    Cancer of left female breast  ([place])   07/27/2014 Initial Diagnosis    Cancer of left female breast ([place])  Metastatic breast cancer ([place])   09/24/2015 Initial Diagnosis    Metastatic breast cancer ([place])    02/27/2018 - 05/21/2018 Chemotherapy    The patient had pegfilgrastim (NEULASTA) injection 6 mg, 6 mg, Subcutaneous, Once, 1 of 1 cycle Administration: 6 mg (04/19/2018) pegfilgrastim (NEULASTA ONPRO KIT) injection 6 mg, 6 mg, Subcutaneous, Once, 1 of 2 cycles Administration: 6 mg (05/01/2018), 6 mg (05/15/2018) eriBULin mesylate (HALAVEN) 2.75 mg in sodium chloride 0.9 % 100 mL chemo infusion, 1.4 mg/m2 = 2.75 mg, Intravenous,  Once, 3 of 4 cycles Dose modification: 1.1 mg/m2 (original dose 1.4 mg/m2, Cycle 1, Reason: Dose not tolerated) Administration: 2.75 mg (02/27/2018), 2.15 mg  (03/20/2018), 2.15 mg (04/04/2018), 2.15 mg (04/17/2018), 2.15 mg (05/01/2018), 2.15 mg (05/15/2018)  for chemotherapy treatment.     06/04/2018 -  Chemotherapy    The patient had gemcitabine (GEMZAR) 1,400 mg in sodium chloride 0.9 % 250 mL chemo infusion, 1,406 mg, Intravenous,  Once, 1 of 4 cycles Administration: 1,400 mg (06/04/2018)  for chemotherapy treatment.      ALLERGIES:  is allergic to ace inhibitors; latex; morphine and related; and penicillins.  MEDICATIONS:  Current Outpatient Medications  Medication Sig Dispense Refill   acetaminophen (TYLENOL) 500 MG tablet Take 500 mg by mouth every 6 (six) hours as needed for mild pain or moderate pain.      atorvastatin (LIPITOR) 10 MG tablet Take 10 mg by mouth at bedtime.      buPROPion (WELLBUTRIN XL) 150 MG 24 hr tablet Take 150 mg by mouth at bedtime.      Calcium-Magnesium-Vitamin D (CALCIUM 1200+D3 PO) Take 1 tablet by mouth daily.      Cyanocobalamin 5000 MCG CAPS Take 5,000 mcg by mouth daily.      fexofenadine (ALLEGRA) 180 MG tablet Take 180 mg by mouth at bedtime.      gabapentin (NEURONTIN) 300 MG capsule Take 300-600 mg by mouth 2 (two) times daily. Take 300 mg by mouth in the morning and 318m at night     levofloxacin (LEVAQUIN) 500 MG tablet Take 1 tablet (500 mg total) by mouth daily. 14 tablet 0   lidocaine-prilocaine (EMLA) cream Apply 1 application topically as needed. Apply to port 1-2 hours prior to chemotherapy appointment. Cover with plastic wrap. 30 g 0   metFORMIN (GLUCOPHAGE) 850 MG tablet Take 850 mg by mouth 2 (two) times daily with a meal.      Oxycodone HCl 10 MG TABS Take 1 tablet (10 mg total) by mouth every 4 (four) hours as needed. Ok to take every 4-6 hours as needed. 90 tablet 0   pantoprazole (PROTONIX) 40 MG tablet Take 40 mg by mouth at bedtime.      PARoxetine (PAXIL) 40 MG tablet Take 40 mg by mouth at bedtime.      prochlorperazine (COMPAZINE) 10 MG tablet Take 1 tablet (10 mg total) by  mouth every 6 (six) hours as needed for nausea or vomiting. 30 tablet 3   sodium chloride (MURO 128) 5 % ophthalmic solution Place 1 drop into both eyes 4 (four) times daily.     zolpidem (AMBIEN) 10 MG tablet Take 1 tablet (10 mg total) by mouth at bedtime as needed (sleep). 30 tablet 0   No current facility-administered medications for this visit.     VITAL SIGNS: There were no vitals taken for this visit. There were no vitals filed for this visit.  Estimated body mass index is 25.92 kg/m as calculated from the following:  Height as of an earlier encounter on 06/11/18: 5' 4"  (1.626 m).   Weight as of an earlier encounter on 06/11/18: 151 lb (68.5 kg).  LABS: CBC:    Component Value Date/Time   WBC 7.4 06/11/2018 1359   HGB 6.5 (L) 06/11/2018 1359   HGB 13.2 12/20/2011 0927   HCT 22.0 (L) 06/11/2018 1359   HCT 40.6 12/20/2011 0927   PLT 152 06/11/2018 1359   PLT 235 12/20/2011 0927   MCV 99.5 06/11/2018 1359   MCV 96 12/20/2011 0927   NEUTROABS 4.8 06/11/2018 1359   NEUTROABS 1.7 12/20/2011 0927   LYMPHSABS 0.8 06/11/2018 1359   LYMPHSABS 1.4 12/20/2011 0927   MONOABS 1.3 (H) 06/11/2018 1359   MONOABS 0.4 12/20/2011 0927   EOSABS 0.0 06/11/2018 1359   EOSABS 0.4 12/20/2011 0927   BASOSABS 0.0 06/11/2018 1359   BASOSABS 0.0 12/20/2011 0927   Comprehensive Metabolic Panel:    Component Value Date/Time   NA 137 06/11/2018 1359   NA 139 12/20/2011 0927   K 4.6 06/11/2018 1359   K 4.1 12/20/2011 0927   CL 106 06/11/2018 1359   CL 102 12/20/2011 0927   CO2 23 06/11/2018 1359   CO2 26 12/20/2011 0927   BUN 27 (H) 06/11/2018 1359   BUN 19 (H) 12/20/2011 0927   CREATININE 0.81 06/11/2018 1359   CREATININE 1.15 12/20/2011 0927   GLUCOSE 165 (H) 06/11/2018 1359   GLUCOSE 251 (H) 12/20/2011 0927   CALCIUM 8.4 (L) 06/11/2018 1359   CALCIUM 8.6 12/20/2011 0927   AST 92 (H) 06/11/2018 1359   AST 49 (H) 12/20/2011 0927   ALT 51 (H) 06/11/2018 1359   ALT 57 12/20/2011  0927   ALKPHOS 184 (H) 06/11/2018 1359   ALKPHOS 61 12/20/2011 0927   BILITOT 0.6 06/11/2018 1359   BILITOT 0.4 12/20/2011 0927   PROT 5.5 (L) 06/11/2018 1359   PROT 6.8 12/20/2011 0927   ALBUMIN 2.8 (L) 06/11/2018 1359   ALBUMIN 3.6 12/20/2011 3254    RADIOGRAPHIC STUDIES: Ct Chest W Contrast  Result Date: 05/20/2018 CLINICAL DATA:  Metastatic breast cancer restaging EXAM: CT CHEST, ABDOMEN, AND PELVIS WITH CONTRAST TECHNIQUE: Multidetector CT imaging of the chest, abdomen and pelvis was performed following the standard protocol during bolus administration of intravenous contrast. CONTRAST:  18m OMNIPAQUE IOHEXOL 300 MG/ML  SOLN COMPARISON:  Multiple exams, including chest CT of 03/03/2018, and abdomen MRI 02/22/2018 FINDINGS: Streak artifact related to the patient's right shoulder prosthesis and right arm positioning noted. CT CHEST FINDINGS Cardiovascular: Right Port-A-Cath tip: Cavoatrial junction. Small amount of fibrin sheath or possibly minimal chronic thrombus along the catheter in the right internal jugular vein for example on image 8/2. Atherosclerotic calcification of the aortic arch and left anterior descending coronary artery with mild cardiomegaly. Mediastinum/Nodes: Small type 1 hiatal hernia. Contrast medium in the esophagus. Lungs/Pleura: Confluent ground-glass opacities primarily in the left lung, and with lesser involvement of the right lung, mostly in the basilar portion of the right lower lobe. Stable lingular scarring. Trace left pleural effusion. Musculoskeletal: Widespread osseous metastatic disease with scattered irregular sclerosis in all vertebral levels in the thoracic spine scattered sclerotic lesions in the ribs and sternum as well as the right scapula. Overall sclerosis appears mildly increased from 03/03/2018. CT ABDOMEN PELVIS FINDINGS Hepatobiliary: Diffuse confluent metastatic disease throughout the liver compatible with increasing confluence of the widespread  extensive hepatic metastatic disease. Mildly contracted gallbladder. No biliary dilatation. Pancreas: Unremarkable Spleen: Unremarkable Adrenals/Urinary Tract: Left renal peripelvic  cysts. Excreted contrast medium is present in the collecting systems in urinary bladder on initial images, reducing sensitivity for nonobstructive calculi. No hydronephrosis or renal mass identified. The adrenal glands appear unremarkable. Stomach/Bowel: Apparent wall thickening in the gastric cardia may be artifactual. There are a few sigmoid colon diverticula. Mild wall thickening in the proximal sigmoid colon for example on images 91 through 101 of series 2. Vascular/Lymphatic: Aortoiliac atherosclerotic vascular disease. Reproductive: Unremarkable Other: Perihepatic, right paracolic gutter, and right pelvic ascites, overall mild to moderate in volume. Musculoskeletal: Widespread osseous metastatic disease in the lumbar spine and bony pelvis, slightly more sclerotic but without definite new lesions compared to 12/18/2017. Lumbar spondylosis and degenerative disc disease cause right foraminal impingement all levels between L1 and L5, and left foraminal impingement at L3-4 and L4-5. IMPRESSION: 1. New confluent ground-glass opacities throughout much of the left lung, with patchy ground-glass opacities in the right lung favoring the right lung base. The pattern can be seen setting of asymmetric pulmonary edema, ARDS, atypical pneumonia, acute hypersensitivity, or acute eosinophilic pneumonia. 2. Diffuse confluent metastatic disease throughout the liver, increased from 02/22/2018. 3. Similar distribution of lesions but increased sclerosis in the widespread osseous metastatic disease. This increased sclerosis is nonspecific, and can sometimes indicate effective response to therapy rather than worsening. 4. Mild wall thickening in the proximal sigmoid colon, suggesting mild nonspecific colitis. 5. Other imaging findings of potential  clinical significance: Fibrin sheath are minimal chronic thrombus along the internal jugular portion of the right IJ Port-A-Cath. Aortic Atherosclerosis (ICD10-I70.0). Coronary atherosclerosis. Small type 1 hiatal hernia. Esophageal contrast medium suggest reflux or dysmotility. Trace left pleural effusion. Lingular scarring. Mild to moderate ascites. Multilevel lumbar impingement. Electronically Signed   By: Van Clines M.D.   On: 05/20/2018 16:32   Mr Jeri Cos BT Contrast  Result Date: 06/03/2018 CLINICAL DATA:  68 year old female with metastatic breast cancer. Progressive weakness and fatigue. EXAM: MRI HEAD WITHOUT AND WITH CONTRAST TECHNIQUE: Multiplanar, multiecho pulse sequences of the brain and surrounding structures were obtained without and with intravenous contrast. CONTRAST:  7 milliliters Gadavist COMPARISON:  Brain MRI 12/21/2017 and earlier. FINDINGS: Brain: Stable cerebral volume. No restricted diffusion to suggest acute infarction. No midline shift, mass effect, evidence of mass lesion, ventriculomegaly, extra-axial collection or acute intracranial hemorrhage. Cervicomedullary junction and pituitary are within normal limits. Stable gray-white matter differentiation throughout the brain. Scattered mild for age nonspecific cerebral white matter T2 and FLAIR hyperintensity. No cortical encephalomalacia or chronic cerebral blood products identified. No abnormal intra-axial enhancement. No dural thickening identified. Vascular: Major intracranial vascular flow voids are stable. The right vertebral artery is dominant. The major dural venous sinuses are enhancing and appear to be patent. Skull and upper cervical spine: Generalized abnormal diffusion in the calvarium and progressed abnormal T1 bone marrow signal compatible with widespread osseous metastatic disease. Progressed clival metastasis, bilateral occipital condyle involvement, and progressed and diffuse visible cervical vertebral  involvement. Negative visible spinal cord. Sinuses/Orbits: Stable with evidence of chronic left lamina papyracea fracture. Other: Mastoids remain clear. Visible internal auditory structures appear normal. Scalp and face soft tissues appear negative. IMPRESSION: 1. Progressed and fairly diffuse osseous metastatic disease since December 2019. 2. Stable brain with no metastatic disease to the brain parenchyma or the dura identified. Electronically Signed   By: Genevie Ann M.D.   On: 06/03/2018 23:35   Ct Abdomen Pelvis W Contrast  Result Date: 05/20/2018 CLINICAL DATA:  Metastatic breast cancer restaging EXAM: CT CHEST, ABDOMEN, AND PELVIS WITH  CONTRAST TECHNIQUE: Multidetector CT imaging of the chest, abdomen and pelvis was performed following the standard protocol during bolus administration of intravenous contrast. CONTRAST:  39m OMNIPAQUE IOHEXOL 300 MG/ML  SOLN COMPARISON:  Multiple exams, including chest CT of 03/03/2018, and abdomen MRI 02/22/2018 FINDINGS: Streak artifact related to the patient's right shoulder prosthesis and right arm positioning noted. CT CHEST FINDINGS Cardiovascular: Right Port-A-Cath tip: Cavoatrial junction. Small amount of fibrin sheath or possibly minimal chronic thrombus along the catheter in the right internal jugular vein for example on image 8/2. Atherosclerotic calcification of the aortic arch and left anterior descending coronary artery with mild cardiomegaly. Mediastinum/Nodes: Small type 1 hiatal hernia. Contrast medium in the esophagus. Lungs/Pleura: Confluent ground-glass opacities primarily in the left lung, and with lesser involvement of the right lung, mostly in the basilar portion of the right lower lobe. Stable lingular scarring. Trace left pleural effusion. Musculoskeletal: Widespread osseous metastatic disease with scattered irregular sclerosis in all vertebral levels in the thoracic spine scattered sclerotic lesions in the ribs and sternum as well as the right scapula.  Overall sclerosis appears mildly increased from 03/03/2018. CT ABDOMEN PELVIS FINDINGS Hepatobiliary: Diffuse confluent metastatic disease throughout the liver compatible with increasing confluence of the widespread extensive hepatic metastatic disease. Mildly contracted gallbladder. No biliary dilatation. Pancreas: Unremarkable Spleen: Unremarkable Adrenals/Urinary Tract: Left renal peripelvic cysts. Excreted contrast medium is present in the collecting systems in urinary bladder on initial images, reducing sensitivity for nonobstructive calculi. No hydronephrosis or renal mass identified. The adrenal glands appear unremarkable. Stomach/Bowel: Apparent wall thickening in the gastric cardia may be artifactual. There are a few sigmoid colon diverticula. Mild wall thickening in the proximal sigmoid colon for example on images 91 through 101 of series 2. Vascular/Lymphatic: Aortoiliac atherosclerotic vascular disease. Reproductive: Unremarkable Other: Perihepatic, right paracolic gutter, and right pelvic ascites, overall mild to moderate in volume. Musculoskeletal: Widespread osseous metastatic disease in the lumbar spine and bony pelvis, slightly more sclerotic but without definite new lesions compared to 12/18/2017. Lumbar spondylosis and degenerative disc disease cause right foraminal impingement all levels between L1 and L5, and left foraminal impingement at L3-4 and L4-5. IMPRESSION: 1. New confluent ground-glass opacities throughout much of the left lung, with patchy ground-glass opacities in the right lung favoring the right lung base. The pattern can be seen setting of asymmetric pulmonary edema, ARDS, atypical pneumonia, acute hypersensitivity, or acute eosinophilic pneumonia. 2. Diffuse confluent metastatic disease throughout the liver, increased from 02/22/2018. 3. Similar distribution of lesions but increased sclerosis in the widespread osseous metastatic disease. This increased sclerosis is nonspecific,  and can sometimes indicate effective response to therapy rather than worsening. 4. Mild wall thickening in the proximal sigmoid colon, suggesting mild nonspecific colitis. 5. Other imaging findings of potential clinical significance: Fibrin sheath are minimal chronic thrombus along the internal jugular portion of the right IJ Port-A-Cath. Aortic Atherosclerosis (ICD10-I70.0). Coronary atherosclerosis. Small type 1 hiatal hernia. Esophageal contrast medium suggest reflux or dysmotility. Trace left pleural effusion. Lingular scarring. Mild to moderate ascites. Multilevel lumbar impingement. Electronically Signed   By: WVan ClinesM.D.   On: 05/20/2018 16:32   Dg SCarlena HurlOp Medicare Speech Path  Result Date: 05/29/2018 CLINICAL DATA:  Dysphagia. History of metastatic breast cancer. Concern for aspiration. EXAM: MODIFIED BARIUM SWALLOW TECHNIQUE: Different consistencies of barium were administered orally to the patient by the Speech Pathologist. Imaging of the pharynx was performed in the lateral projection. The radiologist was present in the fluoroscopy room for this study, providing  personal supervision. FLUOROSCOPY TIME:  Fluoroscopy Time:  54 seconds Radiation Exposure Index (if provided by the fluoroscopic device): 2.6 mGy Number of Acquired Spot Images: 0 COMPARISON:  None. FINDINGS: No aspiration identified. IMPRESSION: Negative for aspiration. Please refer to the Speech Pathologists report for complete details and recommendations. Electronically Signed   By: Kerby Moors M.D.   On: 05/29/2018 12:49    PERFORMANCE STATUS (ECOG) : 1 - Symptomatic but completely ambulatory  Review of Systems As noted above. Otherwise, a complete review of systems is negative.  Physical Exam General: NAD, frail appearing Pulmonary: unlabored, CTA ant fields Cardio: RRR Extremities: trace edema Skin: no rashes Neurological: Weakness but otherwise nonfocal  IMPRESSION: I met with patient today in the  clinic for routine follow up. Patient complains of difficulty swallowing and problems coughing after eating/drinking concerning for aspiration. She underwent MBSS without revealing etiology. She is being referred back to ST for further evaluation. Discussed with Dr. Grayland Ormond. Would consider GI referral if needed.   Patient continues to lose weight. Weight down from 170lbs in 04/2018 to 151lbs in 05/2018. Again, discussed use of supplements, which patient is drinking. Recommended increasing to TID. She is being followed by RD.   It appears that patient is slowly declining. She continues to desire active treatment but she may reach a point of intolerance. Will follow.   PLAN: -Continue current scope of treatment -Continue oxycodone prn for pain -RTC in 2 weeks   Patient expressed understanding and was in agreement with this plan. She also understands that She can call clinic at any time with any questions, concerns, or complaints.    Time Total: 10 minutes  Visit consisted of counseling and education dealing with the complex and emotionally intense issues of symptom management and palliative care in the setting of serious and potentially life-threatening illness.Greater than 50%  of this time was spent counseling and coordinating care related to the above assessment and plan.  Signed by: Altha Harm, PhD, NP-C [phone] (Work Cell)

## 2018-06-13 ENCOUNTER — Inpatient Hospital Stay: Payer: Medicare Other

## 2018-06-13 ENCOUNTER — Other Ambulatory Visit: Payer: Self-pay

## 2018-06-13 NOTE — Progress Notes (Signed)
Nutrition Follow-up:  RD working remotely.  Patient with progressive ER/PR positive breast cancer with metastatic disease to the liver and bone.  Patient followed by Dr. Grayland Ormond and Merrily Pew, Palliative care.  Noted results of recent MBSS by SLP.  Spoke with husband via phone today for nutrition follow-up.  Husband reports that he is pureeing all foods using nutribullet to consistency of milk for patient to swallow.  Still has coughing episodes at times that leaves her exhausted and not wanting to eat further.  Patient likes ensure enlive but it is still too thick and husband thins it down.  Sometimes makes a shake with ensure using banana, ice cream, sometimes protein powder (blended to consistency of milk).  Husband reports that patient is getting about 1200-1500 calories on "good" day otherwise about 1000 calories per day.  Patient meeting about 50-75% of low end of calorie needs per husband report.    Husband asking about further follow-up regarding swallowing issues as feels this is biggest thing impacting patient nutrition and status at this time. Noted referral back to SLP and considering GI referral  Medications: reviewed Noted patient has tried multiple appetite stimulants megace, remeron, dexamethasone without success  Labs: reviewed  Anthropometrics:   Weight 151 lb noted on 5/26 decreased from 175 lb on 4/1  NUTRITION DIAGNOSIS: Inadequate oral intake continues   INTERVENTION:  Agree with further input from SLP and possible GI referral to further evaluate swallowing.  Husband is currently pureeing foods to consistency that patient can tolerate.  RD encouraged him to continue.   Encouraged husband to use high calorie, high protein foods (adding gravies, whole milk, half n half, etc).  Encouraged husband to continue to utilize high calorie shakes (350 calories or more) as often as patient will drink them for additional calories and protein. Encouraged small frequent meals. Husband  prefers to email RD with further nutrition related questions or concerns.      MONITORING, EVALUATION, GOAL: Patient will consume adequate calories to maintain weight   NEXT VISIT: Husband to email/call RD if needed in the future  Amanda [name], [place], [place] Registered Dietitian [phone] (pager)

## 2018-06-14 ENCOUNTER — Inpatient Hospital Stay: Payer: Medicare Other

## 2018-06-14 ENCOUNTER — Other Ambulatory Visit: Payer: Self-pay

## 2018-06-14 DIAGNOSIS — C7951 Secondary malignant neoplasm of bone: Secondary | ICD-10-CM | POA: Diagnosis not present

## 2018-06-14 DIAGNOSIS — D649 Anemia, unspecified: Secondary | ICD-10-CM | POA: Diagnosis not present

## 2018-06-14 DIAGNOSIS — C787 Secondary malignant neoplasm of liver and intrahepatic bile duct: Secondary | ICD-10-CM | POA: Diagnosis not present

## 2018-06-14 DIAGNOSIS — C50919 Malignant neoplasm of unspecified site of unspecified female breast: Secondary | ICD-10-CM | POA: Diagnosis not present

## 2018-06-14 DIAGNOSIS — Z5111 Encounter for antineoplastic chemotherapy: Secondary | ICD-10-CM | POA: Diagnosis not present

## 2018-06-14 DIAGNOSIS — G620 Drug-induced polyneuropathy: Secondary | ICD-10-CM | POA: Diagnosis not present

## 2018-06-14 MED ORDER — SODIUM CHLORIDE 0.9% FLUSH
10.0000 mL | INTRAVENOUS | Status: AC | PRN
  Administered 2018-06-14: 10:00:00 10 mL
  Filled 2018-06-14: qty 10

## 2018-06-14 MED ORDER — HEPARIN SOD (PORK) LOCK FLUSH 100 UNIT/ML IV SOLN
500.0000 [IU] | Freq: Every day | INTRAVENOUS | Status: AC | PRN
  Administered 2018-06-14: 500 [IU]
  Filled 2018-06-14: qty 5

## 2018-06-14 MED ORDER — SODIUM CHLORIDE 0.9% IV SOLUTION
250.0000 mL | Freq: Once | INTRAVENOUS | Status: AC
  Administered 2018-06-14: 250 mL via INTRAVENOUS
  Filled 2018-06-14: qty 250

## 2018-06-14 MED ORDER — DIPHENHYDRAMINE HCL 50 MG/ML IJ SOLN: 25.0000 mg | Freq: Once | INTRAMUSCULAR | Status: DC

## 2018-06-14 MED ORDER — ACETAMINOPHEN 325 MG PO TABS
650.0000 mg | ORAL_TABLET | Freq: Once | ORAL | Status: AC
  Administered 2018-06-14: 10:00:00 650 mg via ORAL
  Filled 2018-06-14: qty 2

## 2018-06-14 NOTE — Progress Notes (Signed)
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  INTERVAL HISTORY: Patient returns to clinic today for further evaluation and consideration of cycle 1, day 15 of gemcitabine.  She continues to have a poor performance status and significant weakness and fatigue.  She continues to have symptoms of aspiration despite a normal swallow study recently.  She continues to have a poor appetite.  Her insomnia has improved.  She has a chronic peripheral neuropathy which is slightly improved, but no other neurologic complaints.  She denies any recent fevers or illnesses.  She denies any chest pain, shortness of breath or hemoptysis. She denies any nausea, vomiting, constipation, or diarrhea. She has no urinary complaints.  Patient offers no further specific complaints today.  REVIEW OF SYSTEMS:   Review of Systems  Constitutional: Positive for malaise/fatigue and weight loss. Negative for fever.  Eyes: Negative.  Negative for blurred vision, double vision and pain.  Respiratory: Positive for cough. Negative for shortness of breath.   Cardiovascular: Negative.  Negative for chest pain and leg swelling.  Gastrointestinal: Negative.  Negative for abdominal pain.  Genitourinary: Negative.  Negative for dysuria and flank pain.  Musculoskeletal: Negative for back pain and falls.  Skin: Negative.  Negative for rash.  Neurological: Positive for tingling, sensory change and weakness. Negative for focal weakness and headaches.  Endo/Heme/Allergies: Negative.   Psychiatric/Behavioral: Positive for memory loss. The patient is not nervous/anxious and does not have insomnia.      As per HPI. Otherwise, a complete review of systems is negative.  PAST MEDICAL HISTORY: Past Medical History:  Diagnosis Date   Anxiety    Back pain    occasionally   Breast cancer ([place]) 2009   left   Depression    takes Paxil and Wellbutrin daily   Diabetes ([place])    takes Metformin daily   type 2    Genetic testing 02/03/2017   Multi-Cancer panel (83 genes) @ [facility] - No pathogenic mutations detected   GERD (gastroesophageal reflux disease)    occasional   History of bronchitis 2 yrs ago   History of kidney stones    Hyperlipidemia    takes Atorvastatin daily   Hypertension    no meds   Insomnia    takes Ambien nightly   Insomnia    takes gabapentin nightly   Joint pain    Mood swings    Osteoarthritis of knee    Personal history of chemotherapy 12/05/2016   Mets from Breast Cancer   Personal history of radiation therapy 11/2016   Pneumonia    PONV (postoperative nausea and vomiting)    Seasonal allergies    takes Allegra daily    PAST SURGICAL HISTORY: Past Surgical History:  Procedure Laterality Date   BREAST BIOPSY  2009   cataract surgery Bilateral    COLONOSCOPY     IR RADIOLOGIST EVAL & MGMT  01/29/2018   JOINT REPLACEMENT Left 2014   knee   KNEE ARTHROSCOPY Left    x 5   MASTECTOMY Left    port a cath placed     PORTACATH PLACEMENT N/A 09/17/2015   Procedure: INSERTION PORT-A-CATH;  Surgeon: Jules Husbands, MD;  Location: [facility];  Service: General;  Laterality: N/A;   RADIOLOGY WITH ANESTHESIA N/A 02/20/2018   Procedure: CT WITH ANESTHESIA MICROWAVE THERMAL ABLATION-LIVER;  Surgeon: Jacqulynn Cadet, MD;  Location: [facility];  Service: Anesthesiology;  Laterality: N/A;   TOOTH EXTRACTION     TOTAL SHOULDER ARTHROPLASTY Right 06/03/2015   Procedure: TOTAL SHOULDER ARTHROPLASTY;  Surgeon: Tania Ade, MD;  Location: [facility];  Service: Orthopedics;  Laterality: Right;  Right total shoulder arthroplasty   TOTAL  SHOULDER REPLACEMENT Right 06/03/2015   TUBAL LIGATION      FAMILY HISTORY: Father with non-Hodgkin's lymphoma, 2 paternal aunts with breast cancer.     ADVANCED DIRECTIVES:    HEALTH MAINTENANCE: Social History   Tobacco Use   Smoking status: Never Smoker   Smokeless tobacco: Never Used  Substance Use Topics   Alcohol use: Yes    Alcohol/week: 0.0 standard drinks    Comment: occasionally wine   Drug use: Yes    Types: Marijuana    Comment: cannabis with no extra  low dose edibles  for neuropathy     Colonoscopy:  PAP:  Bone density:  Lipid panel:  Allergies  Allergen Reactions   Ace Inhibitors Cough   Latex Itching   Morphine And Related Itching    Caused her to itch terribly. Would prefer if given to take with a benadryl   Penicillins Rash    Has patient had a PCN reaction causing immediate rash, facial/tongue/throat swelling, SOB or lightheadedness with hypotension: No Has patient had a PCN reaction causing severe rash involving mucus membranes or skin necrosis: No Has patient had a PCN reaction that required hospitalization No Has patient had a PCN reaction occurring within the last 10 years: No If all of the above answers are "NO", then may proceed with Cephalosporin use.    Current Outpatient Medications  Medication Sig Dispense Refill   acetaminophen (TYLENOL) 500 MG tablet Take 500 mg by mouth every 6 (six) hours as needed for mild pain or moderate pain.      atorvastatin (LIPITOR) 10 MG tablet Take 10 mg by mouth at bedtime.      buPROPion (WELLBUTRIN XL) 150 MG 24 hr tablet Take 150 mg by mouth at bedtime.      Calcium-Magnesium-Vitamin D (CALCIUM 1200+D3 PO) Take 1 tablet by mouth daily.      Cyanocobalamin 5000 MCG CAPS Take 5,000 mcg by mouth daily.      fexofenadine (ALLEGRA) 180 MG tablet Take 180 mg by mouth at bedtime.      gabapentin (NEURONTIN) 300 MG capsule Take 300-600 mg by mouth 2 (two) times daily. Take 300 mg by mouth in  the morning and 351m at night     levofloxacin (LEVAQUIN) 500 MG tablet Take 1 tablet (500 mg total) by mouth daily. 14 tablet 0   lidocaine-prilocaine (EMLA) cream Apply 1 application topically as needed. Apply to port 1-2 hours prior to chemotherapy appointment. Cover with plastic wrap. 30 g 0   metFORMIN (GLUCOPHAGE) 850 MG tablet Take 850 mg by mouth 2 (two) times daily with a meal.      Oxycodone HCl 10 MG TABS Take 1 tablet (10 mg total) by mouth every 4 (four) hours as needed. Ok to take every 4-6 hours as needed. 90 tablet 0   pantoprazole (PROTONIX) 40 MG tablet Take 40 mg by mouth at bedtime.      PARoxetine (PAXIL) 40 MG tablet Take 40 mg by mouth at bedtime.      prochlorperazine (COMPAZINE) 10  MG tablet Take 1 tablet (10 mg total) by mouth every 6 (six) hours as needed for nausea or vomiting. 30 tablet 3   sodium chloride (MURO 128) 5 % ophthalmic solution Place 1 drop into both eyes 4 (four) times daily.     zolpidem (AMBIEN) 10 MG tablet Take 1 tablet (10 mg total) by mouth at bedtime as needed (sleep). 30 tablet 0   No current facility-administered medications for this visit.     OBJECTIVE: Vitals:   06/18/18 0930  BP: 124/79  Pulse: (!) 119  Temp: 98.3 F (36.8 C)     Body mass index is 25.92 kg/m.    ECOG FS:3 - Symptomatic, >50% confined to bed  General: Thin, ill-appearing, no acute distress. Eyes: Pink conjunctiva, anicteric sclera. HEENT: Normocephalic, moist mucous membranes, clear oropharnyx. Lungs: Clear to auscultation bilaterally. Heart: Regular rate and rhythm. No rubs, murmurs, or gallops. Abdomen: Soft, nontender, nondistended. No organomegaly noted, normoactive bowel sounds. Musculoskeletal: No edema, cyanosis, or clubbing. Neuro: Alert, answering all questions appropriately. Cranial nerves grossly intact. Skin: No rashes or petechiae noted. Psych: Flat affect.  LAB RESULTS:  Lab Results  Component Value Date   NA 136 06/18/2018   K 4.2  06/18/2018   CL 105 06/18/2018   CO2 23 06/18/2018   GLUCOSE 220 (H) 06/18/2018   BUN 23 06/18/2018   CREATININE 0.94 06/18/2018   CALCIUM 8.1 (L) 06/18/2018   PROT 5.3 (L) 06/18/2018   ALBUMIN 2.9 (L) 06/18/2018   AST 66 (H) 06/18/2018   ALT 41 06/18/2018   ALKPHOS 150 (H) 06/18/2018   BILITOT 0.6 06/18/2018   GFRNONAA >60 06/18/2018   GFRAA >60 06/18/2018    Lab Results  Component Value Date   WBC 4.9 06/18/2018   NEUTROABS 3.4 06/18/2018   HGB 7.8 (L) 06/18/2018   HCT 25.4 (L) 06/18/2018   MCV 93.4 06/18/2018   PLT 224 06/18/2018     STUDIES: Ct Chest W Contrast  Result Date: 05/20/2018 CLINICAL DATA:  Metastatic breast cancer restaging EXAM: CT CHEST, ABDOMEN, AND PELVIS WITH CONTRAST TECHNIQUE: Multidetector CT imaging of the chest, abdomen and pelvis was performed following the standard protocol during bolus administration of intravenous contrast. CONTRAST:  24m OMNIPAQUE IOHEXOL 300 MG/ML  SOLN COMPARISON:  Multiple exams, including chest CT of 03/03/2018, and abdomen MRI 02/22/2018 FINDINGS: Streak artifact related to the patient's right shoulder prosthesis and right arm positioning noted. CT CHEST FINDINGS Cardiovascular: Right Port-A-Cath tip: Cavoatrial junction. Small amount of fibrin sheath or possibly minimal chronic thrombus along the catheter in the right internal jugular vein for example on image 8/2. Atherosclerotic calcification of the aortic arch and left anterior descending coronary artery with mild cardiomegaly. Mediastinum/Nodes: Small type 1 hiatal hernia. Contrast medium in the esophagus. Lungs/Pleura: Confluent ground-glass opacities primarily in the left lung, and with lesser involvement of the right lung, mostly in the basilar portion of the right lower lobe. Stable lingular scarring. Trace left pleural effusion. Musculoskeletal: Widespread osseous metastatic disease with scattered irregular sclerosis in all vertebral levels in the thoracic spine scattered  sclerotic lesions in the ribs and sternum as well as the right scapula. Overall sclerosis appears mildly increased from 03/03/2018. CT ABDOMEN PELVIS FINDINGS Hepatobiliary: Diffuse confluent metastatic disease throughout the liver compatible with increasing confluence of the widespread extensive hepatic metastatic disease. Mildly contracted gallbladder. No biliary dilatation. Pancreas: Unremarkable Spleen: Unremarkable Adrenals/Urinary Tract: Left renal peripelvic cysts. Excreted contrast medium is present in the collecting systems in urinary bladder on initial  images, reducing sensitivity for nonobstructive calculi. No hydronephrosis or renal mass identified. The adrenal glands appear unremarkable. Stomach/Bowel: Apparent wall thickening in the gastric cardia may be artifactual. There are a few sigmoid colon diverticula. Mild wall thickening in the proximal sigmoid colon for example on images 91 through 101 of series 2. Vascular/Lymphatic: Aortoiliac atherosclerotic vascular disease. Reproductive: Unremarkable Other: Perihepatic, right paracolic gutter, and right pelvic ascites, overall mild to moderate in volume. Musculoskeletal: Widespread osseous metastatic disease in the lumbar spine and bony pelvis, slightly more sclerotic but without definite new lesions compared to 12/18/2017. Lumbar spondylosis and degenerative disc disease cause right foraminal impingement all levels between L1 and L5, and left foraminal impingement at L3-4 and L4-5. IMPRESSION: 1. New confluent ground-glass opacities throughout much of the left lung, with patchy ground-glass opacities in the right lung favoring the right lung base. The pattern can be seen setting of asymmetric pulmonary edema, ARDS, atypical pneumonia, acute hypersensitivity, or acute eosinophilic pneumonia. 2. Diffuse confluent metastatic disease throughout the liver, increased from 02/22/2018. 3. Similar distribution of lesions but increased sclerosis in the widespread  osseous metastatic disease. This increased sclerosis is nonspecific, and can sometimes indicate effective response to therapy rather than worsening. 4. Mild wall thickening in the proximal sigmoid colon, suggesting mild nonspecific colitis. 5. Other imaging findings of potential clinical significance: Fibrin sheath are minimal chronic thrombus along the internal jugular portion of the right IJ Port-A-Cath. Aortic Atherosclerosis (ICD10-I70.0). Coronary atherosclerosis. Small type 1 hiatal hernia. Esophageal contrast medium suggest reflux or dysmotility. Trace left pleural effusion. Lingular scarring. Mild to moderate ascites. Multilevel lumbar impingement. Electronically Signed   By: Van Clines M.D.   On: 05/20/2018 16:32   Mr Jeri Cos TZ Contrast  Result Date: 06/03/2018 CLINICAL DATA:  68 year old female with metastatic breast cancer. Progressive weakness and fatigue. EXAM: MRI HEAD WITHOUT AND WITH CONTRAST TECHNIQUE: Multiplanar, multiecho pulse sequences of the brain and surrounding structures were obtained without and with intravenous contrast. CONTRAST:  7 milliliters Gadavist COMPARISON:  Brain MRI 12/21/2017 and earlier. FINDINGS: Brain: Stable cerebral volume. No restricted diffusion to suggest acute infarction. No midline shift, mass effect, evidence of mass lesion, ventriculomegaly, extra-axial collection or acute intracranial hemorrhage. Cervicomedullary junction and pituitary are within normal limits. Stable gray-white matter differentiation throughout the brain. Scattered mild for age nonspecific cerebral white matter T2 and FLAIR hyperintensity. No cortical encephalomalacia or chronic cerebral blood products identified. No abnormal intra-axial enhancement. No dural thickening identified. Vascular: Major intracranial vascular flow voids are stable. The right vertebral artery is dominant. The major dural venous sinuses are enhancing and appear to be patent. Skull and upper cervical spine:  Generalized abnormal diffusion in the calvarium and progressed abnormal T1 bone marrow signal compatible with widespread osseous metastatic disease. Progressed clival metastasis, bilateral occipital condyle involvement, and progressed and diffuse visible cervical vertebral involvement. Negative visible spinal cord. Sinuses/Orbits: Stable with evidence of chronic left lamina papyracea fracture. Other: Mastoids remain clear. Visible internal auditory structures appear normal. Scalp and face soft tissues appear negative. IMPRESSION: 1. Progressed and fairly diffuse osseous metastatic disease since December 2019. 2. Stable brain with no metastatic disease to the brain parenchyma or the dura identified. Electronically Signed   By: Genevie Ann M.D.   On: 06/03/2018 23:35   Ct Abdomen Pelvis W Contrast  Result Date: 05/20/2018 CLINICAL DATA:  Metastatic breast cancer restaging EXAM: CT CHEST, ABDOMEN, AND PELVIS WITH CONTRAST TECHNIQUE: Multidetector CT imaging of the chest, abdomen and pelvis was performed following the  standard protocol during bolus administration of intravenous contrast. CONTRAST:  101m OMNIPAQUE IOHEXOL 300 MG/ML  SOLN COMPARISON:  Multiple exams, including chest CT of 03/03/2018, and abdomen MRI 02/22/2018 FINDINGS: Streak artifact related to the patient's right shoulder prosthesis and right arm positioning noted. CT CHEST FINDINGS Cardiovascular: Right Port-A-Cath tip: Cavoatrial junction. Small amount of fibrin sheath or possibly minimal chronic thrombus along the catheter in the right internal jugular vein for example on image 8/2. Atherosclerotic calcification of the aortic arch and left anterior descending coronary artery with mild cardiomegaly. Mediastinum/Nodes: Small type 1 hiatal hernia. Contrast medium in the esophagus. Lungs/Pleura: Confluent ground-glass opacities primarily in the left lung, and with lesser involvement of the right lung, mostly in the basilar portion of the right lower  lobe. Stable lingular scarring. Trace left pleural effusion. Musculoskeletal: Widespread osseous metastatic disease with scattered irregular sclerosis in all vertebral levels in the thoracic spine scattered sclerotic lesions in the ribs and sternum as well as the right scapula. Overall sclerosis appears mildly increased from 03/03/2018. CT ABDOMEN PELVIS FINDINGS Hepatobiliary: Diffuse confluent metastatic disease throughout the liver compatible with increasing confluence of the widespread extensive hepatic metastatic disease. Mildly contracted gallbladder. No biliary dilatation. Pancreas: Unremarkable Spleen: Unremarkable Adrenals/Urinary Tract: Left renal peripelvic cysts. Excreted contrast medium is present in the collecting systems in urinary bladder on initial images, reducing sensitivity for nonobstructive calculi. No hydronephrosis or renal mass identified. The adrenal glands appear unremarkable. Stomach/Bowel: Apparent wall thickening in the gastric cardia may be artifactual. There are a few sigmoid colon diverticula. Mild wall thickening in the proximal sigmoid colon for example on images 91 through 101 of series 2. Vascular/Lymphatic: Aortoiliac atherosclerotic vascular disease. Reproductive: Unremarkable Other: Perihepatic, right paracolic gutter, and right pelvic ascites, overall mild to moderate in volume. Musculoskeletal: Widespread osseous metastatic disease in the lumbar spine and bony pelvis, slightly more sclerotic but without definite new lesions compared to 12/18/2017. Lumbar spondylosis and degenerative disc disease cause right foraminal impingement all levels between L1 and L5, and left foraminal impingement at L3-4 and L4-5. IMPRESSION: 1. New confluent ground-glass opacities throughout much of the left lung, with patchy ground-glass opacities in the right lung favoring the right lung base. The pattern can be seen setting of asymmetric pulmonary edema, ARDS, atypical pneumonia, acute  hypersensitivity, or acute eosinophilic pneumonia. 2. Diffuse confluent metastatic disease throughout the liver, increased from 02/22/2018. 3. Similar distribution of lesions but increased sclerosis in the widespread osseous metastatic disease. This increased sclerosis is nonspecific, and can sometimes indicate effective response to therapy rather than worsening. 4. Mild wall thickening in the proximal sigmoid colon, suggesting mild nonspecific colitis. 5. Other imaging findings of potential clinical significance: Fibrin sheath are minimal chronic thrombus along the internal jugular portion of the right IJ Port-A-Cath. Aortic Atherosclerosis (ICD10-I70.0). Coronary atherosclerosis. Small type 1 hiatal hernia. Esophageal contrast medium suggest reflux or dysmotility. Trace left pleural effusion. Lingular scarring. Mild to moderate ascites. Multilevel lumbar impingement. Electronically Signed   By: WVan ClinesM.D.   On: 05/20/2018 16:32   Dg SCarlena HurlOp Medicare Speech Path  Result Date: 05/29/2018 CLINICAL DATA:  Dysphagia. History of metastatic breast cancer. Concern for aspiration. EXAM: MODIFIED BARIUM SWALLOW TECHNIQUE: Different consistencies of barium were administered orally to the patient by the Speech Pathologist. Imaging of the pharynx was performed in the lateral projection. The radiologist was present in the fluoroscopy room for this study, providing personal supervision. FLUOROSCOPY TIME:  Fluoroscopy Time:  54 seconds Radiation Exposure Index (if provided  by the fluoroscopic device): 2.6 mGy Number of Acquired Spot Images: 0 COMPARISON:  None. FINDINGS: No aspiration identified. IMPRESSION: Negative for aspiration. Please refer to the Speech Pathologists report for complete details and recommendations. Electronically Signed   By: Kerby Moors M.D.   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  PLAN:     1.  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  MRI of the brain on Jun 03, 2018 did not reveal metastatic disease.  Hospice and end-of-life care have been discussed on multiple occasions, but patient declined and wished to try additional chemotherapy.  She expressed understanding that given her performance status, there is likely no additional options after trying single agent gemcitabine.  Given patient's persistent pancytopenia, she will likely only be able to tolerate treatment every other week.  Proceed with cycle 1, day 15 of gemcitabine today.  Return to clinic in 1 week for laboratory work and possible blood transfusion and then in 2 weeks for further evaluation and consideration of cycle 2, day 1.   2.  Anemia: Patient's hemoglobin has improved to 7.8.  She does not require transfusion today.  Follow-up as above.   3.  Peripheral neuropathy: Chronic and unchanged.   4.  Back pain: MRI results from May 04, 2017 did not reveal metastatic disease.  Continue symptomatic treatment.  5.  Left IJ clot: Ultrasound results reviewed independently.  Patient has been instructed to discontinue Eliquis. 6.  Neutropenia: Resolved. 7.  Memory complaints/confusion: Repeat MRI of the brain on Jun 03, 2018 reviewed independently with no obvious intracranial abnormality.  Patient continues to have progressive and persistent osseous metastatic disease. 8.  Elevated liver enzymes: Chronic and unchanged.  AST only mildly elevated today.  Gemcitabine does not need to be dose reduced in the setting of liver dysfunction. 9.  Apparent aspiration: Despite a recent normal barium swallow, patient still describes symptoms of significant aspiration with eating.  A referral has been sent to speech pathology for further evaluation. 10.  Poor  appetite: Patient has tried Remeron and dexamethasone without much effect.  She only took 2 days of Megace prior to discontinuing.  Patient does not wish to retry Megace at this time.  Appreciate dietary input.   11.  Insomnia: Improved.  Continue Xanax as prescribed. 12.  Bilateral lung opacities: Patient has completed a 14-day course of Levaquin.  Patient expressed understanding and was in agreement with this plan. She also understands that She can call clinic at any time with any questions, concerns, or complaints.   Breast cancer   Staging form: Breast, AJCC 7th Edition     Pathologic stage from 08/11/2014: Stage IIIA (T0, N2a, cM0) - Signed by Lloyd Huger, MD on 08/11/2014   Lloyd Huger, MD   06/19/2018 6:52 AM

## 2018-06-15 LAB — TYPE AND SCREEN
ABO/RH(D): O POS
Antibody Screen: NEGATIVE
Unit division: 0

## 2018-06-15 LAB — BPAM RBC
Blood Product Expiration Date: 202006212359
ISSUE DATE / TIME: 202005291005
Unit Type and Rh: 5100

## 2018-06-17 ENCOUNTER — Other Ambulatory Visit: Payer: Self-pay

## 2018-06-18 ENCOUNTER — Inpatient Hospital Stay (HOSPITAL_BASED_OUTPATIENT_CLINIC_OR_DEPARTMENT_OTHER): Payer: Medicare Other | Admitting: Oncology

## 2018-06-18 ENCOUNTER — Inpatient Hospital Stay: Payer: Medicare Other | Attending: Nurse Practitioner | Admitting: Nurse Practitioner

## 2018-06-18 ENCOUNTER — Other Ambulatory Visit: Payer: Self-pay

## 2018-06-18 ENCOUNTER — Inpatient Hospital Stay: Payer: Medicare Other | Attending: Oncology

## 2018-06-18 ENCOUNTER — Inpatient Hospital Stay: Payer: Medicare Other

## 2018-06-18 ENCOUNTER — Encounter: Payer: Self-pay | Admitting: Oncology

## 2018-06-18 VITALS — BP 124/79 | HR 119 | Temp 98.3°F | Ht 64.0 in | Wt 151.0 lb

## 2018-06-18 DIAGNOSIS — Z79899 Other long term (current) drug therapy: Secondary | ICD-10-CM | POA: Insufficient documentation

## 2018-06-18 DIAGNOSIS — C787 Secondary malignant neoplasm of liver and intrahepatic bile duct: Secondary | ICD-10-CM | POA: Insufficient documentation

## 2018-06-18 DIAGNOSIS — G620 Drug-induced polyneuropathy: Secondary | ICD-10-CM | POA: Diagnosis not present

## 2018-06-18 DIAGNOSIS — Z515 Encounter for palliative care: Secondary | ICD-10-CM

## 2018-06-18 DIAGNOSIS — Z170 Estrogen receptor positive status [ER+]: Secondary | ICD-10-CM | POA: Insufficient documentation

## 2018-06-18 DIAGNOSIS — Z5111 Encounter for antineoplastic chemotherapy: Secondary | ICD-10-CM | POA: Diagnosis not present

## 2018-06-18 DIAGNOSIS — F329 Major depressive disorder, single episode, unspecified: Secondary | ICD-10-CM | POA: Diagnosis not present

## 2018-06-18 DIAGNOSIS — R945 Abnormal results of liver function studies: Secondary | ICD-10-CM | POA: Diagnosis not present

## 2018-06-18 DIAGNOSIS — R531 Weakness: Secondary | ICD-10-CM

## 2018-06-18 DIAGNOSIS — C50919 Malignant neoplasm of unspecified site of unspecified female breast: Secondary | ICD-10-CM | POA: Diagnosis not present

## 2018-06-18 DIAGNOSIS — G4700 Insomnia, unspecified: Secondary | ICD-10-CM | POA: Diagnosis not present

## 2018-06-18 DIAGNOSIS — C7951 Secondary malignant neoplasm of bone: Secondary | ICD-10-CM | POA: Insufficient documentation

## 2018-06-18 DIAGNOSIS — E785 Hyperlipidemia, unspecified: Secondary | ICD-10-CM | POA: Insufficient documentation

## 2018-06-18 DIAGNOSIS — K219 Gastro-esophageal reflux disease without esophagitis: Secondary | ICD-10-CM | POA: Diagnosis not present

## 2018-06-18 DIAGNOSIS — D649 Anemia, unspecified: Secondary | ICD-10-CM

## 2018-06-18 DIAGNOSIS — R410 Disorientation, unspecified: Secondary | ICD-10-CM | POA: Insufficient documentation

## 2018-06-18 DIAGNOSIS — R748 Abnormal levels of other serum enzymes: Secondary | ICD-10-CM | POA: Insufficient documentation

## 2018-06-18 DIAGNOSIS — I10 Essential (primary) hypertension: Secondary | ICD-10-CM | POA: Diagnosis not present

## 2018-06-18 DIAGNOSIS — R1310 Dysphagia, unspecified: Secondary | ICD-10-CM | POA: Diagnosis not present

## 2018-06-18 DIAGNOSIS — E1142 Type 2 diabetes mellitus with diabetic polyneuropathy: Secondary | ICD-10-CM | POA: Diagnosis not present

## 2018-06-18 DIAGNOSIS — R630 Anorexia: Secondary | ICD-10-CM | POA: Insufficient documentation

## 2018-06-18 DIAGNOSIS — M549 Dorsalgia, unspecified: Secondary | ICD-10-CM | POA: Diagnosis not present

## 2018-06-18 DIAGNOSIS — Z923 Personal history of irradiation: Secondary | ICD-10-CM | POA: Diagnosis not present

## 2018-06-18 DIAGNOSIS — Z95828 Presence of other vascular implants and grafts: Secondary | ICD-10-CM

## 2018-06-18 DIAGNOSIS — R5383 Other fatigue: Secondary | ICD-10-CM

## 2018-06-18 DIAGNOSIS — M2550 Pain in unspecified joint: Secondary | ICD-10-CM | POA: Diagnosis not present

## 2018-06-18 DIAGNOSIS — C50912 Malignant neoplasm of unspecified site of left female breast: Secondary | ICD-10-CM

## 2018-06-18 LAB — CBC WITH DIFFERENTIAL/PLATELET
Abs Immature Granulocytes: 0.07 10*3/uL (ref 0.00–0.07)
Basophils Absolute: 0 10*3/uL (ref 0.0–0.1)
Basophils Relative: 1 %
Eosinophils Absolute: 0.1 10*3/uL (ref 0.0–0.5)
Eosinophils Relative: 1 %
HCT: 25.4 % — ABNORMAL LOW (ref 36.0–46.0)
Hemoglobin: 7.8 g/dL — ABNORMAL LOW (ref 12.0–15.0)
Immature Granulocytes: 1 %
Lymphocytes Relative: 13 %
Lymphs Abs: 0.6 10*3/uL — ABNORMAL LOW (ref 0.7–4.0)
MCH: 28.7 pg (ref 26.0–34.0)
MCHC: 30.7 g/dL (ref 30.0–36.0)
MCV: 93.4 fL (ref 80.0–100.0)
Monocytes Absolute: 0.7 10*3/uL (ref 0.1–1.0)
Monocytes Relative: 15 %
Neutro Abs: 3.4 10*3/uL (ref 1.7–7.7)
Neutrophils Relative %: 69 %
Platelets: 224 10*3/uL (ref 150–400)
RBC: 2.72 MIL/uL — ABNORMAL LOW (ref 3.87–5.11)
RDW: 20.8 % — ABNORMAL HIGH (ref 11.5–15.5)
WBC: 4.9 10*3/uL (ref 4.0–10.5)
nRBC: 0 % (ref 0.0–0.2)

## 2018-06-18 LAB — COMPREHENSIVE METABOLIC PANEL
ALT: 41 U/L (ref 0–44)
AST: 66 U/L — ABNORMAL HIGH (ref 15–41)
Albumin: 2.9 g/dL — ABNORMAL LOW (ref 3.5–5.0)
Alkaline Phosphatase: 150 U/L — ABNORMAL HIGH (ref 38–126)
Anion gap: 8 (ref 5–15)
BUN: 23 mg/dL (ref 8–23)
CO2: 23 mmol/L (ref 22–32)
Calcium: 8.1 mg/dL — ABNORMAL LOW (ref 8.9–10.3)
Chloride: 105 mmol/L (ref 98–111)
Creatinine, Ser: 0.94 mg/dL (ref 0.44–1.00)
GFR calc Af Amer: >60 mL/min (ref >60)
GFR calc non Af Amer: >60 mL/min (ref >60)
Glucose, Bld: 220 mg/dL — ABNORMAL HIGH (ref 70–99)
Potassium: 4.2 mmol/L (ref 3.5–5.1)
Sodium: 136 mmol/L (ref 135–145)
Total Bilirubin: 0.6 mg/dL (ref 0.3–1.2)
Total Protein: 5.3 g/dL — ABNORMAL LOW (ref 6.5–8.1)

## 2018-06-18 MED ORDER — SODIUM CHLORIDE 0.9 % IV SOLN
Freq: Once | INTRAVENOUS | Status: AC
  Administered 2018-06-18: 10:00:00 via INTRAVENOUS
  Filled 2018-06-18: qty 250

## 2018-06-18 MED ORDER — SODIUM CHLORIDE 0.9% FLUSH
10.0000 mL | Freq: Once | INTRAVENOUS | Status: AC
  Administered 2018-06-18: 10 mL via INTRAVENOUS
  Filled 2018-06-18: qty 10

## 2018-06-18 MED ORDER — SODIUM CHLORIDE 0.9 % IV SOLN
1400.0000 mg | Freq: Once | INTRAVENOUS | Status: AC
  Administered 2018-06-18: 1400 mg via INTRAVENOUS
  Filled 2018-06-18: qty 10.52

## 2018-06-18 MED ORDER — PROCHLORPERAZINE MALEATE 10 MG PO TABS
10.0000 mg | ORAL_TABLET | Freq: Once | ORAL | Status: AC
  Administered 2018-06-18: 10 mg via ORAL
  Filled 2018-06-18: qty 1

## 2018-06-18 MED ORDER — HEPARIN SOD (PORK) LOCK FLUSH 100 UNIT/ML IV SOLN
500.0000 [IU] | Freq: Once | INTRAVENOUS | Status: AC | PRN
  Administered 2018-06-18: 500 [IU]
  Filled 2018-06-18: qty 5

## 2018-06-18 MED ORDER — ZOLEDRONIC ACID 4 MG/100ML IV SOLN
4.0000 mg | Freq: Once | INTRAVENOUS | Status: AC
  Administered 2018-06-18: 4 mg via INTRAVENOUS
  Filled 2018-06-18: qty 100

## 2018-06-18 NOTE — Progress Notes (Signed)
HR 119, ok to proceed per md

## 2018-06-18 NOTE — Progress Notes (Signed)
Patient stated that she had been doing well. Patient stated that her appetite had been the same but not losing weight. Patient denied fever, chills, diarrhea or constipation. Patient also stated that she had been sleep well.

## 2018-06-18 NOTE — Progress Notes (Signed)
[place]  [phone] [phone]   Name: Amanda [name] Date: 06/18/2018 MRN: [id number]  DOB: [birth year]  Patient Care Team: Sofie Hartigan, MD as PCP - General (Family Medicine) Dahlia Byes, Marjory Lies, MD as Consulting Physician (General Surgery)    REASON FOR CONSULTATION: Palliative Care consult requested for this 68 y.o. 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  She was found to have disease progression on Ibrance and letrozole.  Patient previously had verbalized a desire not to pursue further treatment in the event of disease progression.  However, she is decided to start [place].  Palliative care was consulted to help address goals and follow for support.  SOCIAL HISTORY:    Patient lives at home with her husband.  She has a son and stepson who are involved.  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  Her husband owns a "ozone" business.  ADVANCE DIRECTIVES:  Not on file. Has HCPOA and living will at home.   CODE STATUS: DNR  PAST MEDICAL HISTORY: Past Medical History:  Diagnosis Date   Anxiety    Back pain    occasionally   Breast cancer ([place]) 2009   left   Depression    takes Paxil and Wellbutrin daily   Diabetes ([place])    takes Metformin daily   type 2    Genetic testing 02/03/2017   Multi-Cancer panel (83 genes) @ [facility] - No pathogenic mutations detected   GERD (gastroesophageal reflux disease)    occasional   History of bronchitis 2 yrs ago   History of kidney stones    Hyperlipidemia    takes Atorvastatin daily   Hypertension    no meds   Insomnia    takes Ambien nightly   Insomnia    takes gabapentin nightly   Joint pain    Mood swings    Osteoarthritis of knee    Personal history of chemotherapy 12/05/2016   Mets from Breast Cancer   Personal history of radiation therapy 11/2016    Pneumonia    PONV (postoperative nausea and vomiting)    Seasonal allergies    takes Allegra daily    PAST SURGICAL HISTORY:  Past Surgical History:  Procedure Laterality Date   BREAST BIOPSY  2009   cataract surgery Bilateral    COLONOSCOPY     IR RADIOLOGIST EVAL & MGMT  01/29/2018   JOINT REPLACEMENT Left 2014   knee   KNEE ARTHROSCOPY Left    x 5   MASTECTOMY Left    port a cath placed     PORTACATH PLACEMENT N/A 09/17/2015   Procedure: INSERTION PORT-A-CATH;  Surgeon: Jules Husbands, MD;  Location: [facility];  Service: General;  Laterality: N/A;   RADIOLOGY WITH ANESTHESIA N/A 02/20/2018   Procedure: CT WITH ANESTHESIA MICROWAVE THERMAL ABLATION-LIVER;  Surgeon: Jacqulynn Cadet, MD;  Location: [facility];  Service: Anesthesiology;  Laterality: N/A;   TOOTH EXTRACTION     TOTAL SHOULDER ARTHROPLASTY Right 06/03/2015   Procedure: TOTAL SHOULDER ARTHROPLASTY;  Surgeon: Tania Ade, MD;  Location: [facility];  Service: Orthopedics;  Laterality: Right;  Right total shoulder arthroplasty   TOTAL SHOULDER REPLACEMENT Right 06/03/2015   TUBAL LIGATION      HEMATOLOGY/ONCOLOGY HISTORY:    Cancer of left female breast  ([place])   07/27/2014 Initial Diagnosis    Cancer of left female breast ([place])  Metastatic breast cancer ([place])   09/24/2015 Initial Diagnosis    Metastatic breast cancer ([place])    02/27/2018 - 05/21/2018 Chemotherapy    The patient had pegfilgrastim (NEULASTA) injection 6 mg, 6 mg, Subcutaneous, Once, 1 of 1 cycle Administration: 6 mg (04/19/2018) pegfilgrastim (NEULASTA ONPRO KIT) injection 6 mg, 6 mg, Subcutaneous, Once, 1 of 2 cycles Administration: 6 mg (05/01/2018), 6 mg (05/15/2018) eriBULin mesylate (HALAVEN) 2.75 mg in sodium chloride 0.9 % 100 mL chemo infusion, 1.4 mg/m2 = 2.75 mg, Intravenous,  Once, 3 of 4 cycles Dose modification: 1.1 mg/m2 (original dose 1.4 mg/m2, Cycle 1, Reason: Dose not tolerated) Administration: 2.75 mg (02/27/2018), 2.15 mg  (03/20/2018), 2.15 mg (04/04/2018), 2.15 mg (04/17/2018), 2.15 mg (05/01/2018), 2.15 mg (05/15/2018)  for chemotherapy treatment.     06/04/2018 -  Chemotherapy    The patient had gemcitabine (GEMZAR) 1,400 mg in sodium chloride 0.9 % 250 mL chemo infusion, 1,406 mg, Intravenous,  Once, 2 of 4 cycles Administration: 1,400 mg (06/04/2018)  for chemotherapy treatment.      ALLERGIES:  is allergic to ace inhibitors; latex; morphine and related; and penicillins.  MEDICATIONS:  Current Outpatient Medications  Medication Sig Dispense Refill   acetaminophen (TYLENOL) 500 MG tablet Take 500 mg by mouth every 6 (six) hours as needed for mild pain or moderate pain.      atorvastatin (LIPITOR) 10 MG tablet Take 10 mg by mouth at bedtime.      buPROPion (WELLBUTRIN XL) 150 MG 24 hr tablet Take 150 mg by mouth at bedtime.      Calcium-Magnesium-Vitamin D (CALCIUM 1200+D3 PO) Take 1 tablet by mouth daily.      Cyanocobalamin 5000 MCG CAPS Take 5,000 mcg by mouth daily.      fexofenadine (ALLEGRA) 180 MG tablet Take 180 mg by mouth at bedtime.      gabapentin (NEURONTIN) 300 MG capsule Take 300-600 mg by mouth 2 (two) times daily. Take 300 mg by mouth in the morning and 350m at night     levofloxacin (LEVAQUIN) 500 MG tablet Take 1 tablet (500 mg total) by mouth daily. 14 tablet 0   lidocaine-prilocaine (EMLA) cream Apply 1 application topically as needed. Apply to port 1-2 hours prior to chemotherapy appointment. Cover with plastic wrap. 30 g 0   metFORMIN (GLUCOPHAGE) 850 MG tablet Take 850 mg by mouth 2 (two) times daily with a meal.      Oxycodone HCl 10 MG TABS Take 1 tablet (10 mg total) by mouth every 4 (four) hours as needed. Ok to take every 4-6 hours as needed. 90 tablet 0   pantoprazole (PROTONIX) 40 MG tablet Take 40 mg by mouth at bedtime.      PARoxetine (PAXIL) 40 MG tablet Take 40 mg by mouth at bedtime.      prochlorperazine (COMPAZINE) 10 MG tablet Take 1 tablet (10 mg total) by  mouth every 6 (six) hours as needed for nausea or vomiting. 30 tablet 3   sodium chloride (MURO 128) 5 % ophthalmic solution Place 1 drop into both eyes 4 (four) times daily.     zolpidem (AMBIEN) 10 MG tablet Take 1 tablet (10 mg total) by mouth at bedtime as needed (sleep). 30 tablet 0   No current facility-administered medications for this visit.    Facility-Administered Medications Ordered in Other Visits  Medication Dose Route Frequency Provider Last Rate Last Dose   gemcitabine (GEMZAR) 1,400 mg in sodium chloride 0.9 % 250 mL chemo infusion  1,400  mg Intravenous Once Lloyd Huger, MD 574 mL/hr at 06/18/18 1057 1,400 mg at 06/18/18 1057   heparin lock flush 100 unit/mL  500 Units Intracatheter Once PRN Lloyd Huger, MD        VITAL SIGNS: There were no vitals taken for this visit. There were no vitals filed for this visit.  Estimated body mass index is 25.92 kg/m as calculated from the following:   Height as of an earlier encounter on 06/18/18: _0  (1.626 m).   Weight as of an earlier encounter on 06/18/18: 151 lb (68.5 kg).  LABS: CBC:    Component Value Date/Time   WBC 4.9 06/18/2018 0903   HGB 7.8 (L) 06/18/2018 0903   HGB 13.2 12/20/2011 0927   HCT 25.4 (L) 06/18/2018 0903   HCT 40.6 12/20/2011 0927   PLT 224 06/18/2018 0903   PLT 235 12/20/2011 0927   MCV 93.4 06/18/2018 0903   MCV 96 12/20/2011 0927   NEUTROABS 3.4 06/18/2018 0903   NEUTROABS 1.7 12/20/2011 0927   LYMPHSABS 0.6 (L) 06/18/2018 0903   LYMPHSABS 1.4 12/20/2011 0927   MONOABS 0.7 06/18/2018 0903   MONOABS 0.4 12/20/2011 0927   EOSABS 0.1 06/18/2018 0903   EOSABS 0.4 12/20/2011 0927   BASOSABS 0.0 06/18/2018 0903   BASOSABS 0.0 12/20/2011 0927   Comprehensive Metabolic Panel:    Component Value Date/Time   NA 136 06/18/2018 0903   NA 139 12/20/2011 0927   K 4.2 06/18/2018 0903   K 4.1 12/20/2011 0927   CL 105 06/18/2018 0903   CL 102 12/20/2011 0927   CO2 23 06/18/2018 0903     CO2 26 12/20/2011 0927   BUN 23 06/18/2018 0903   BUN 19 (H) 12/20/2011 0927   CREATININE 0.94 06/18/2018 0903   CREATININE 1.15 12/20/2011 0927   GLUCOSE 220 (H) 06/18/2018 0903   GLUCOSE 251 (H) 12/20/2011 0927   CALCIUM 8.1 (L) 06/18/2018 0903   CALCIUM 8.6 12/20/2011 0927   AST 66 (H) 06/18/2018 0903   AST 49 (H) 12/20/2011 0927   ALT 41 06/18/2018 0903   ALT 57 12/20/2011 0927   ALKPHOS 150 (H) 06/18/2018 0903   ALKPHOS 61 12/20/2011 0927   BILITOT 0.6 06/18/2018 0903   BILITOT 0.4 12/20/2011 0927   PROT 5.3 (L) 06/18/2018 0903   PROT 6.8 12/20/2011 0927   ALBUMIN 2.9 (L) 06/18/2018 0903   ALBUMIN 3.6 12/20/2011 0927    RADIOGRAPHIC STUDIES: Ct Chest W Contrast  Result Date: 05/20/2018 CLINICAL DATA:  Metastatic breast cancer restaging EXAM: CT CHEST, ABDOMEN, AND PELVIS WITH CONTRAST TECHNIQUE: Multidetector CT imaging of the chest, abdomen and pelvis was performed following the standard protocol during bolus administration of intravenous contrast. CONTRAST:  68m OMNIPAQUE IOHEXOL 300 MG/ML  SOLN COMPARISON:  Multiple exams, including chest CT of 03/03/2018, and abdomen MRI 02/22/2018 FINDINGS: Streak artifact related to the patient's right shoulder prosthesis and right arm positioning noted. CT CHEST FINDINGS Cardiovascular: Right Port-A-Cath tip: Cavoatrial junction. Small amount of fibrin sheath or possibly minimal chronic thrombus along the catheter in the right internal jugular vein for example on image 8/2. Atherosclerotic calcification of the aortic arch and left anterior descending coronary artery with mild cardiomegaly. Mediastinum/Nodes: Small type 1 hiatal hernia. Contrast medium in the esophagus. Lungs/Pleura: Confluent ground-glass opacities primarily in the left lung, and with lesser involvement of the right lung, mostly in the basilar portion of the right lower lobe. Stable lingular scarring. Trace left pleural effusion. Musculoskeletal: Widespread osseous metastatic  disease with scattered irregular sclerosis in all vertebral levels in the thoracic spine scattered sclerotic lesions in the ribs and sternum as well as the right scapula. Overall sclerosis appears mildly increased from 03/03/2018. CT ABDOMEN PELVIS FINDINGS Hepatobiliary: Diffuse confluent metastatic disease throughout the liver compatible with increasing confluence of the widespread extensive hepatic metastatic disease. Mildly contracted gallbladder. No biliary dilatation. Pancreas: Unremarkable Spleen: Unremarkable Adrenals/Urinary Tract: Left renal peripelvic cysts. Excreted contrast medium is present in the collecting systems in urinary bladder on initial images, reducing sensitivity for nonobstructive calculi. No hydronephrosis or renal mass identified. The adrenal glands appear unremarkable. Stomach/Bowel: Apparent wall thickening in the gastric cardia may be artifactual. There are a few sigmoid colon diverticula. Mild wall thickening in the proximal sigmoid colon for example on images 91 through 101 of series 2. Vascular/Lymphatic: Aortoiliac atherosclerotic vascular disease. Reproductive: Unremarkable Other: Perihepatic, right paracolic gutter, and right pelvic ascites, overall mild to moderate in volume. Musculoskeletal: Widespread osseous metastatic disease in the lumbar spine and bony pelvis, slightly more sclerotic but without definite new lesions compared to 12/18/2017. Lumbar spondylosis and degenerative disc disease cause right foraminal impingement all levels between L1 and L5, and left foraminal impingement at L3-4 and L4-5. IMPRESSION: 1. New confluent ground-glass opacities throughout much of the left lung, with patchy ground-glass opacities in the right lung favoring the right lung base. The pattern can be seen setting of asymmetric pulmonary edema, ARDS, atypical pneumonia, acute hypersensitivity, or acute eosinophilic pneumonia. 2. Diffuse confluent metastatic disease throughout the liver,  increased from 02/22/2018. 3. Similar distribution of lesions but increased sclerosis in the widespread osseous metastatic disease. This increased sclerosis is nonspecific, and can sometimes indicate effective response to therapy rather than worsening. 4. Mild wall thickening in the proximal sigmoid colon, suggesting mild nonspecific colitis. 5. Other imaging findings of potential clinical significance: Fibrin sheath are minimal chronic thrombus along the internal jugular portion of the right IJ Port-A-Cath. Aortic Atherosclerosis (ICD10-I70.0). Coronary atherosclerosis. Small type 1 hiatal hernia. Esophageal contrast medium suggest reflux or dysmotility. Trace left pleural effusion. Lingular scarring. Mild to moderate ascites. Multilevel lumbar impingement. Electronically Signed   By: Van Clines M.D.   On: 05/20/2018 16:32   Mr Jeri Cos BM Contrast  Result Date: 06/03/2018 CLINICAL DATA:  68 year old female with metastatic breast cancer. Progressive weakness and fatigue. EXAM: MRI HEAD WITHOUT AND WITH CONTRAST TECHNIQUE: Multiplanar, multiecho pulse sequences of the brain and surrounding structures were obtained without and with intravenous contrast. CONTRAST:  7 milliliters Gadavist COMPARISON:  Brain MRI 12/21/2017 and earlier. FINDINGS: Brain: Stable cerebral volume. No restricted diffusion to suggest acute infarction. No midline shift, mass effect, evidence of mass lesion, ventriculomegaly, extra-axial collection or acute intracranial hemorrhage. Cervicomedullary junction and pituitary are within normal limits. Stable gray-white matter differentiation throughout the brain. Scattered mild for age nonspecific cerebral white matter T2 and FLAIR hyperintensity. No cortical encephalomalacia or chronic cerebral blood products identified. No abnormal intra-axial enhancement. No dural thickening identified. Vascular: Major intracranial vascular flow voids are stable. The right vertebral artery is dominant.  The major dural venous sinuses are enhancing and appear to be patent. Skull and upper cervical spine: Generalized abnormal diffusion in the calvarium and progressed abnormal T1 bone marrow signal compatible with widespread osseous metastatic disease. Progressed clival metastasis, bilateral occipital condyle involvement, and progressed and diffuse visible cervical vertebral involvement. Negative visible spinal cord. Sinuses/Orbits: Stable with evidence of chronic left lamina papyracea fracture. Other: Mastoids remain clear. Visible internal auditory structures appear normal. Scalp  and face soft tissues appear negative. IMPRESSION: 1. Progressed and fairly diffuse osseous metastatic disease since December 2019. 2. Stable brain with no metastatic disease to the brain parenchyma or the dura identified. Electronically Signed   By: Genevie Ann M.D.   On: 06/03/2018 23:35   Ct Abdomen Pelvis W Contrast  Result Date: 05/20/2018 CLINICAL DATA:  Metastatic breast cancer restaging EXAM: CT CHEST, ABDOMEN, AND PELVIS WITH CONTRAST TECHNIQUE: Multidetector CT imaging of the chest, abdomen and pelvis was performed following the standard protocol during bolus administration of intravenous contrast. CONTRAST:  84m OMNIPAQUE IOHEXOL 300 MG/ML  SOLN COMPARISON:  Multiple exams, including chest CT of 03/03/2018, and abdomen MRI 02/22/2018 FINDINGS: Streak artifact related to the patient's right shoulder prosthesis and right arm positioning noted. CT CHEST FINDINGS Cardiovascular: Right Port-A-Cath tip: Cavoatrial junction. Small amount of fibrin sheath or possibly minimal chronic thrombus along the catheter in the right internal jugular vein for example on image 8/2. Atherosclerotic calcification of the aortic arch and left anterior descending coronary artery with mild cardiomegaly. Mediastinum/Nodes: Small type 1 hiatal hernia. Contrast medium in the esophagus. Lungs/Pleura: Confluent ground-glass opacities primarily in the left  lung, and with lesser involvement of the right lung, mostly in the basilar portion of the right lower lobe. Stable lingular scarring. Trace left pleural effusion. Musculoskeletal: Widespread osseous metastatic disease with scattered irregular sclerosis in all vertebral levels in the thoracic spine scattered sclerotic lesions in the ribs and sternum as well as the right scapula. Overall sclerosis appears mildly increased from 03/03/2018. CT ABDOMEN PELVIS FINDINGS Hepatobiliary: Diffuse confluent metastatic disease throughout the liver compatible with increasing confluence of the widespread extensive hepatic metastatic disease. Mildly contracted gallbladder. No biliary dilatation. Pancreas: Unremarkable Spleen: Unremarkable Adrenals/Urinary Tract: Left renal peripelvic cysts. Excreted contrast medium is present in the collecting systems in urinary bladder on initial images, reducing sensitivity for nonobstructive calculi. No hydronephrosis or renal mass identified. The adrenal glands appear unremarkable. Stomach/Bowel: Apparent wall thickening in the gastric cardia may be artifactual. There are a few sigmoid colon diverticula. Mild wall thickening in the proximal sigmoid colon for example on images 91 through 101 of series 2. Vascular/Lymphatic: Aortoiliac atherosclerotic vascular disease. Reproductive: Unremarkable Other: Perihepatic, right paracolic gutter, and right pelvic ascites, overall mild to moderate in volume. Musculoskeletal: Widespread osseous metastatic disease in the lumbar spine and bony pelvis, slightly more sclerotic but without definite new lesions compared to 12/18/2017. Lumbar spondylosis and degenerative disc disease cause right foraminal impingement all levels between L1 and L5, and left foraminal impingement at L3-4 and L4-5. IMPRESSION: 1. New confluent ground-glass opacities throughout much of the left lung, with patchy ground-glass opacities in the right lung favoring the right lung base.  The pattern can be seen setting of asymmetric pulmonary edema, ARDS, atypical pneumonia, acute hypersensitivity, or acute eosinophilic pneumonia. 2. Diffuse confluent metastatic disease throughout the liver, increased from 02/22/2018. 3. Similar distribution of lesions but increased sclerosis in the widespread osseous metastatic disease. This increased sclerosis is nonspecific, and can sometimes indicate effective response to therapy rather than worsening. 4. Mild wall thickening in the proximal sigmoid colon, suggesting mild nonspecific colitis. 5. Other imaging findings of potential clinical significance: Fibrin sheath are minimal chronic thrombus along the internal jugular portion of the right IJ Port-A-Cath. Aortic Atherosclerosis (ICD10-I70.0). Coronary atherosclerosis. Small type 1 hiatal hernia. Esophageal contrast medium suggest reflux or dysmotility. Trace left pleural effusion. Lingular scarring. Mild to moderate ascites. Multilevel lumbar impingement. Electronically Signed   By: WVan Clines  M.D.   On: 05/20/2018 16:32   Dg Carlena Hurl Op [employer] Speech Path  Result Date: 05/29/2018 CLINICAL DATA:  Dysphagia. History of metastatic breast cancer. Concern for aspiration. EXAM: MODIFIED BARIUM SWALLOW TECHNIQUE: Different consistencies of barium were administered orally to the patient by the Speech Pathologist. Imaging of the pharynx was performed in the lateral projection. The radiologist was present in the fluoroscopy room for this study, providing personal supervision. FLUOROSCOPY TIME:  Fluoroscopy Time:  54 seconds Radiation Exposure Index (if provided by the fluoroscopic device): 2.6 mGy Number of Acquired Spot Images: 0 COMPARISON:  None. FINDINGS: No aspiration identified. IMPRESSION: Negative for aspiration. Please refer to the Speech Pathologists report for complete details and recommendations. Electronically Signed   By: Kerby Moors M.D.   On: 05/29/2018 12:49    PERFORMANCE  STATUS (ECOG) : 1 - Symptomatic but completely ambulatory  Review of Systems As noted above. Otherwise, a complete review of systems is negative.  Physical Exam General: NAD, frail appearing Pulmonary: unlabored, CTA ant fields Cardio: RRR Extremities: trace edema Skin: no rashes Neurological: Weakness but otherwise nonfocal  IMPRESSION: I met with the patient today in infusion suite for follow-up.  She continues to have difficulty swallowing. She was previously referred to speech therapy by Dr. Grayland Ormond but has not yet met with them.  She lost approximately 20 pounds in April 2020 but since then weight has been overall stable.  She is using nutritional supplement drinks and is followed by RD.  She says that pain is minimal and she rarely takes oxycodone.  She has chronic lymphedema of the left hand which she says is moderately controlled with compression garment which she is wearing today.  She wants to continue treatment and realizes that she is declining.  She says she becomes frustrated at "not being able to do things" at home but does not able to elaborate.  She declines home health and/or home palliative care.    She says that her advanced care planning documents are up-to-date.  I advised that should she wish to make any changes to those documents she can bring them to an appointment and we will review them.  She is scheduled to follow-up with Dr. Grayland Ormond in 2 weeks.  She declined palliative care follow-up at that time and said she would like to see palliative care in 1 month which is agreeable.  She denies needing refills today.  PLAN: -Continue current scope of treatment -Continue oxycodone prn for pain -RTC in 4 weeks  Patient expressed understanding and was in agreement with this plan. She also understands that She can call clinic at any time with any questions, concerns, or complaints.    Time Total: 10 minutes  Visit consisted of counseling and education dealing with  the complex and emotionally intense issues of symptom management and palliative care in the setting of serious and potentially life-threatening illness.Greater than 50%  of this time was spent counseling and coordinating care related to the above assessment and plan.  Signed by: Beckey Rutter, DNP, AGNP-C [facility] [place] (work cell) [phone] (office)  CC: Dr. Grayland Ormond

## 2018-06-18 NOTE — Progress Notes (Signed)
Ca 8.1, alb 2.9, corr ca =8.98

## 2018-06-18 NOTE — Progress Notes (Signed)
Per Dr. Grayland Ormond okay to proceed with treatment with HR of 119, pt to receive Zometa also, and okay to proceed with Zometa with Calcium of 8.1. Labs are within treatment parameters.

## 2018-06-19 LAB — SAMPLE TO BLOOD BANK

## 2018-06-23 NOTE — Progress Notes (Signed)
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  INTERVAL HISTORY: Patient returns to clinic today for further evaluation and consideration of blood transfusion.  She continues to have a poor performance status and significant weakness and fatigue.   She has a chronic peripheral neuropathy which is slightly improved, but no other neurologic complaints.  She denies any recent fevers or illnesses.  She denies any chest pain, shortness of breath or hemoptysis.  She continues to have occasional cough, particularly with eating.  She denies any nausea, vomiting, constipation, or diarrhea. She has no urinary complaints.  Patient feels generally terrible, but offers no further specific complaints today.  REVIEW OF SYSTEMS:   Review of Systems  Constitutional: Positive for malaise/fatigue and weight loss. Negative for fever.  Eyes: Negative.  Negative for blurred vision, double vision and pain.  Respiratory: Positive for cough. Negative for shortness of breath.   Cardiovascular: Negative.  Negative for chest pain and leg swelling.  Gastrointestinal: Negative.  Negative for abdominal pain.  Genitourinary: Negative.  Negative for dysuria and flank pain.  Musculoskeletal: Negative for back pain and falls.  Skin: Negative.  Negative for rash.  Neurological: Positive for tingling, sensory change and weakness. Negative for focal weakness and headaches.  Endo/Heme/Allergies: Negative.   Psychiatric/Behavioral: Positive for memory loss. The patient is not nervous/anxious and does not have insomnia.     As per HPI. Otherwise, a complete review of systems is negative.   PAST MEDICAL HISTORY: Past Medical History:  Diagnosis Date  . Anxiety   . Back pain    occasionally  . Breast cancer ([place]) 2009   left  . Depression    takes Paxil and Wellbutrin daily  . Diabetes ([place])    takes Metformin daily   type 2   . Genetic testing 02/03/2017   Multi-Cancer panel (83 genes) @ [facility] - No pathogenic mutations detected  . GERD (gastroesophageal reflux disease)    occasional  . History of bronchitis 2 yrs ago  . History of kidney stones   . Hyperlipidemia    takes Atorvastatin daily  . Hypertension    no meds  . Insomnia    takes Ambien nightly  . Insomnia    takes gabapentin nightly  . Joint pain   . Mood swings   . Osteoarthritis of knee   . Personal history of chemotherapy 12/05/2016   Mets from Breast Cancer  . Personal history of radiation therapy 11/2016  . Pneumonia   . PONV (postoperative nausea and vomiting)   . Seasonal allergies    takes Allegra daily    PAST SURGICAL HISTORY: Past Surgical History:  Procedure Laterality Date  . BREAST BIOPSY  2009  . cataract surgery Bilateral   . COLONOSCOPY    . IR RADIOLOGIST EVAL & MGMT  01/29/2018  . JOINT REPLACEMENT Left 2014   knee  . KNEE ARTHROSCOPY Left    x 5  . MASTECTOMY Left   . port a cath placed    . PORTACATH PLACEMENT N/A 09/17/2015   Procedure: INSERTION PORT-A-CATH;  Surgeon: Jules Husbands, MD;  Location: [facility];  Service: General;  Laterality: N/A;  . RADIOLOGY WITH ANESTHESIA N/A 02/20/2018  Procedure: CT WITH ANESTHESIA MICROWAVE THERMAL ABLATION-LIVER;  Surgeon: Jacqulynn Cadet, MD;  Location: [facility];  Service: Anesthesiology;  Laterality: N/A;  . TOOTH EXTRACTION    . TOTAL SHOULDER ARTHROPLASTY Right 06/03/2015   Procedure: TOTAL SHOULDER ARTHROPLASTY;  Surgeon: Tania Ade, MD;  Location: [facility];  Service: Orthopedics;  Laterality: Right;  Right total shoulder arthroplasty  . TOTAL SHOULDER REPLACEMENT Right 06/03/2015  . TUBAL LIGATION      FAMILY  HISTORY: Father with non-Hodgkin's lymphoma, 2 paternal aunts with breast cancer.     ADVANCED DIRECTIVES:    HEALTH MAINTENANCE: Social History   Tobacco Use  . Smoking status: Never Smoker  . Smokeless tobacco: Never Used  Substance Use Topics  . Alcohol use: Yes    Alcohol/week: 0.0 standard drinks    Comment: occasionally wine  . Drug use: Yes    Types: Marijuana    Comment: cannabis with no extra  low dose edibles  for neuropathy     Colonoscopy:  PAP:  Bone density:  Lipid panel:  Allergies  Allergen Reactions  . Ace Inhibitors Cough  . Latex Itching  . Morphine And Related Itching    Caused her to itch terribly. Would prefer if given to take with a benadryl  . Penicillins Rash    Has patient had a PCN reaction causing immediate rash, facial/tongue/throat swelling, SOB or lightheadedness with hypotension: No Has patient had a PCN reaction causing severe rash involving mucus membranes or skin necrosis: No Has patient had a PCN reaction that required hospitalization No Has patient had a PCN reaction occurring within the last 10 years: No If all of the above answers are "NO", then may proceed with Cephalosporin use.    Current Outpatient Medications  Medication Sig Dispense Refill  . acetaminophen (TYLENOL) 500 MG tablet Take 500 mg by mouth every 6 (six) hours as needed for mild pain or moderate pain.     Marland Kitchen atorvastatin (LIPITOR) 10 MG tablet Take 10 mg by mouth at bedtime.     Marland Kitchen buPROPion (WELLBUTRIN XL) 150 MG 24 hr tablet Take 150 mg by mouth at bedtime.     . Calcium-Magnesium-Vitamin D (CALCIUM 1200+D3 PO) Take 1 tablet by mouth daily.     . Cyanocobalamin 5000 MCG CAPS Take 5,000 mcg by mouth daily.     . fexofenadine (ALLEGRA) 180 MG tablet Take 180 mg by mouth at bedtime.     . gabapentin (NEURONTIN) 300 MG capsule Take 300-600 mg by mouth 2 (two) times daily. Take 300 mg by mouth in the morning and 39m at night    . levofloxacin (LEVAQUIN) 500 MG tablet  Take 1 tablet (500 mg total) by mouth daily. 14 tablet 0  . lidocaine-prilocaine (EMLA) cream Apply 1 application topically as needed. Apply to port 1-2 hours prior to chemotherapy appointment. Cover with plastic wrap. 30 g 0  . metFORMIN (GLUCOPHAGE) 850 MG tablet Take 850 mg by mouth 2 (two) times daily with a meal.     . Oxycodone HCl 10 MG TABS Take 1 tablet (10 mg total) by mouth every 4 (four) hours as needed. Ok to take every 4-6 hours as needed. 90 tablet 0  . pantoprazole (PROTONIX) 40 MG tablet Take 40 mg by mouth at bedtime.     .Marland KitchenPARoxetine (PAXIL) 40 MG tablet Take 40 mg by mouth at bedtime.     . prochlorperazine (COMPAZINE) 10 MG tablet TAKE 1 TABLET BY MOUTH EVERY 6 HOURS AS NEEDED FOR  NAUSEA OR  VOMITING 120 tablet 1  . sodium chloride (MURO 128) 5 % ophthalmic solution Place 1 drop into both eyes 4 (four) times daily.    Marland Kitchen zolpidem (AMBIEN) 10 MG tablet Take 1 tablet (10 mg total) by mouth at bedtime as needed (sleep). 30 tablet 0   No current facility-administered medications for this visit.     OBJECTIVE: Vitals:   06/25/18 1132  BP: 117/76  Pulse: (!) 114  Temp: (!) 95.5 F (35.3 C)     Body mass index is 24.89 kg/m.    ECOG FS:3 - Symptomatic, >50% confined to bed  General: Thin, no acute distress. Eyes: Pink conjunctiva, anicteric sclera. HEENT: Normocephalic, moist mucous membranes. Lungs: Clear to auscultation bilaterally. Heart: Regular rate and rhythm. No rubs, murmurs, or gallops. Abdomen: Soft, nontender, nondistended. No organomegaly noted, normoactive bowel sounds. Musculoskeletal: No edema, cyanosis, or clubbing. Neuro: Alert, answering all questions appropriately. Cranial nerves grossly intact. Skin: No rashes or petechiae noted. Psych: Flat affect.  LAB RESULTS:  Lab Results  Component Value Date   NA 137 06/25/2018   K 4.6 06/25/2018   CL 106 06/25/2018   CO2 22 06/25/2018   GLUCOSE 161 (H) 06/25/2018   BUN 24 (H) 06/25/2018   CREATININE  0.91 06/25/2018   CALCIUM 8.4 (L) 06/25/2018   PROT 5.5 (L) 06/25/2018   ALBUMIN 2.8 (L) 06/25/2018   AST 60 (H) 06/25/2018   ALT 50 (H) 06/25/2018   ALKPHOS 145 (H) 06/25/2018   BILITOT 0.5 06/25/2018   GFRNONAA >60 06/25/2018   GFRAA >60 06/25/2018    Lab Results  Component Value Date   WBC 4.2 06/25/2018   NEUTROABS 2.5 06/25/2018   HGB 5.8 (L) 06/25/2018   HCT 19.6 (L) 06/25/2018   MCV 97.0 06/25/2018   PLT 136 (L) 06/25/2018     STUDIES: Mr Jeri Cos ZO Contrast  Result Date: 06/03/2018 CLINICAL DATA:  68 year old female with metastatic breast cancer. Progressive weakness and fatigue. EXAM: MRI HEAD WITHOUT AND WITH CONTRAST TECHNIQUE: Multiplanar, multiecho pulse sequences of the brain and surrounding structures were obtained without and with intravenous contrast. CONTRAST:  7 milliliters Gadavist COMPARISON:  Brain MRI 12/21/2017 and earlier. FINDINGS: Brain: Stable cerebral volume. No restricted diffusion to suggest acute infarction. No midline shift, mass effect, evidence of mass lesion, ventriculomegaly, extra-axial collection or acute intracranial hemorrhage. Cervicomedullary junction and pituitary are within normal limits. Stable gray-white matter differentiation throughout the brain. Scattered mild for age nonspecific cerebral white matter T2 and FLAIR hyperintensity. No cortical encephalomalacia or chronic cerebral blood products identified. No abnormal intra-axial enhancement. No dural thickening identified. Vascular: Major intracranial vascular flow voids are stable. The right vertebral artery is dominant. The major dural venous sinuses are enhancing and appear to be patent. Skull and upper cervical spine: Generalized abnormal diffusion in the calvarium and progressed abnormal T1 bone marrow signal compatible with widespread osseous metastatic disease. Progressed clival metastasis, bilateral occipital condyle involvement, and progressed and diffuse visible cervical vertebral  involvement. Negative visible spinal cord. Sinuses/Orbits: Stable with evidence of chronic left lamina papyracea fracture. Other: Mastoids remain clear. Visible internal auditory structures appear normal. Scalp and face soft tissues appear negative. IMPRESSION: 1. Progressed and fairly diffuse osseous metastatic disease since December 2019. 2. Stable brain with no metastatic disease to the brain parenchyma or the dura identified. Electronically Signed   By: Genevie Ann M.D.   On: 06/03/2018 23:35   Dg Carlena Hurl Op [employer] Speech Path  Result Date: 05/29/2018  CLINICAL DATA:  Dysphagia. History of metastatic breast cancer. Concern for aspiration. EXAM: MODIFIED BARIUM SWALLOW TECHNIQUE: Different consistencies of barium were administered orally to the patient by the Speech Pathologist. Imaging of the pharynx was performed in the lateral projection. The radiologist was present in the fluoroscopy room for this study, providing personal supervision. FLUOROSCOPY TIME:  Fluoroscopy Time:  54 seconds Radiation Exposure Index (if provided by the fluoroscopic device): 2.6 mGy Number of Acquired Spot Images: 0 COMPARISON:  None. FINDINGS: No aspiration identified. IMPRESSION: Negative for aspiration. Please refer to the Speech Pathologists report for complete details and recommendations. Electronically Signed   By: Kerby Moors M.D.   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  PLAN:    1.  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  MRI of the brain on Jun 03, 2018 did not reveal metastatic disease.  Hospice and end-of-life care have been discussed on multiple occasions, but patient declined and wished to try additional chemotherapy.  She expressed  understanding that given her performance status, there is likely no additional options after trying single agent gemcitabine.  Given patient's persistent pancytopenia, she will likely only be able to tolerate treatment every other week.  Patient received cycle 1, day 15 of gemcitabine last week.  Return to clinic in 1 week for further evaluation and consideration of cycle 2, day 1. 2.  Anemia: Patient's hemoglobin has significantly declined to 5.8.  Proceed with 2 units packed red blood cells today. 3.  Peripheral neuropathy: Chronic and unchanged.   4.  Back pain: MRI results from May 04, 2017 did not reveal metastatic disease.  Continue symptomatic treatment.  5.  Left IJ clot: Ultrasound results reviewed independently.  Patient has been instructed to discontinue Eliquis. 6.  Neutropenia: Resolved. 7.  Memory complaints/confusion: Repeat MRI of the brain on Jun 03, 2018 reviewed independently with no obvious intracranial abnormality.  Patient continues to have progressive and persistent osseous metastatic disease. 8.  Elevated liver enzymes: Chronic and unchanged. Gemcitabine does not need to be dose reduced in the setting of liver dysfunction. 9.  Apparent aspiration: Despite a recent normal barium swallow, patient still describes symptoms of significant aspiration with eating.  A referral has been sent to speech pathology for further evaluation. 10.  Poor appetite: Patient has tried Remeron and dexamethasone without much effect.  She only took 2 days of Megace prior to discontinuing.  Patient does not wish to retry Megace at this time.  Appreciate dietary input.   11.  Insomnia: Improved.  Continue Xanax as prescribed. 12.  Bilateral lung opacities: Patient has completed a 14-day course of Levaquin.  I spent a total of 30 minutes face-to-face with the patient of which greater than 50% of the visit was spent in counseling and coordination of care as detailed above.   Patient expressed  understanding and was in agreement with this plan. She also understands that She can call clinic at any time with any questions, concerns, or complaints.   Breast cancer   Staging form: Breast, AJCC 7th Edition     Pathologic stage from 08/11/2014: Stage IIIA (T0, N2a, cM0) - Signed by Lloyd Huger, MD on 08/11/2014   Lloyd Huger, MD   06/26/2018 7:00 AM

## 2018-06-24 ENCOUNTER — Other Ambulatory Visit: Payer: Self-pay | Admitting: Oncology

## 2018-06-24 DIAGNOSIS — C50919 Malignant neoplasm of unspecified site of unspecified female breast: Secondary | ICD-10-CM

## 2018-06-24 DIAGNOSIS — C50912 Malignant neoplasm of unspecified site of left female breast: Secondary | ICD-10-CM

## 2018-06-24 NOTE — Telephone Encounter (Signed)
Dr. Finnegan patient

## 2018-06-25 ENCOUNTER — Inpatient Hospital Stay (HOSPITAL_BASED_OUTPATIENT_CLINIC_OR_DEPARTMENT_OTHER): Payer: Medicare Other | Admitting: Oncology

## 2018-06-25 ENCOUNTER — Other Ambulatory Visit: Payer: Self-pay

## 2018-06-25 ENCOUNTER — Inpatient Hospital Stay: Payer: Medicare Other

## 2018-06-25 ENCOUNTER — Encounter: Payer: Self-pay | Admitting: Oncology

## 2018-06-25 VITALS — BP 117/76 | HR 114 | Temp 95.5°F | Ht 64.0 in | Wt 145.0 lb

## 2018-06-25 DIAGNOSIS — R945 Abnormal results of liver function studies: Secondary | ICD-10-CM

## 2018-06-25 DIAGNOSIS — R630 Anorexia: Secondary | ICD-10-CM | POA: Diagnosis not present

## 2018-06-25 DIAGNOSIS — C50919 Malignant neoplasm of unspecified site of unspecified female breast: Secondary | ICD-10-CM | POA: Diagnosis not present

## 2018-06-25 DIAGNOSIS — D649 Anemia, unspecified: Secondary | ICD-10-CM

## 2018-06-25 DIAGNOSIS — G620 Drug-induced polyneuropathy: Secondary | ICD-10-CM | POA: Diagnosis not present

## 2018-06-25 DIAGNOSIS — C7951 Secondary malignant neoplasm of bone: Secondary | ICD-10-CM | POA: Diagnosis not present

## 2018-06-25 DIAGNOSIS — R5383 Other fatigue: Secondary | ICD-10-CM | POA: Diagnosis not present

## 2018-06-25 DIAGNOSIS — R531 Weakness: Secondary | ICD-10-CM

## 2018-06-25 DIAGNOSIS — Z170 Estrogen receptor positive status [ER+]: Secondary | ICD-10-CM | POA: Diagnosis not present

## 2018-06-25 DIAGNOSIS — C787 Secondary malignant neoplasm of liver and intrahepatic bile duct: Secondary | ICD-10-CM

## 2018-06-25 DIAGNOSIS — R05 Cough: Secondary | ICD-10-CM | POA: Diagnosis not present

## 2018-06-25 DIAGNOSIS — Z515 Encounter for palliative care: Secondary | ICD-10-CM | POA: Diagnosis not present

## 2018-06-25 DIAGNOSIS — Z5111 Encounter for antineoplastic chemotherapy: Secondary | ICD-10-CM | POA: Diagnosis not present

## 2018-06-25 DIAGNOSIS — Z95828 Presence of other vascular implants and grafts: Secondary | ICD-10-CM

## 2018-06-25 LAB — CBC WITH DIFFERENTIAL/PLATELET
Abs Immature Granulocytes: 0.28 10*3/uL — ABNORMAL HIGH (ref 0.00–0.07)
Basophils Absolute: 0 10*3/uL (ref 0.0–0.1)
Basophils Relative: 1 %
Eosinophils Absolute: 0 10*3/uL (ref 0.0–0.5)
Eosinophils Relative: 1 %
HCT: 19.6 % — ABNORMAL LOW (ref 36.0–46.0)
Hemoglobin: 5.8 g/dL — ABNORMAL LOW (ref 12.0–15.0)
Immature Granulocytes: 7 %
Lymphocytes Relative: 13 %
Lymphs Abs: 0.6 10*3/uL — ABNORMAL LOW (ref 0.7–4.0)
MCH: 28.7 pg (ref 26.0–34.0)
MCHC: 29.6 g/dL — ABNORMAL LOW (ref 30.0–36.0)
MCV: 97 fL (ref 80.0–100.0)
Monocytes Absolute: 0.8 10*3/uL (ref 0.1–1.0)
Monocytes Relative: 18 %
Neutro Abs: 2.5 10*3/uL (ref 1.7–7.7)
Neutrophils Relative %: 60 %
Platelets: 136 10*3/uL — ABNORMAL LOW (ref 150–400)
RBC: 2.02 MIL/uL — ABNORMAL LOW (ref 3.87–5.11)
RDW: 20.5 % — ABNORMAL HIGH (ref 11.5–15.5)
WBC: 4.2 10*3/uL (ref 4.0–10.5)
nRBC: 2.2 % — ABNORMAL HIGH (ref 0.0–0.2)

## 2018-06-25 LAB — COMPREHENSIVE METABOLIC PANEL
ALT: 50 U/L — ABNORMAL HIGH (ref 0–44)
AST: 60 U/L — ABNORMAL HIGH (ref 15–41)
Albumin: 2.8 g/dL — ABNORMAL LOW (ref 3.5–5.0)
Alkaline Phosphatase: 145 U/L — ABNORMAL HIGH (ref 38–126)
Anion gap: 9 (ref 5–15)
BUN: 24 mg/dL — ABNORMAL HIGH (ref 8–23)
CO2: 22 mmol/L (ref 22–32)
Calcium: 8.4 mg/dL — ABNORMAL LOW (ref 8.9–10.3)
Chloride: 106 mmol/L (ref 98–111)
Creatinine, Ser: 0.91 mg/dL (ref 0.44–1.00)
GFR calc Af Amer: >60 mL/min (ref >60)
GFR calc non Af Amer: >60 mL/min (ref >60)
Glucose, Bld: 161 mg/dL — ABNORMAL HIGH (ref 70–99)
Potassium: 4.6 mmol/L (ref 3.5–5.1)
Sodium: 137 mmol/L (ref 135–145)
Total Bilirubin: 0.5 mg/dL (ref 0.3–1.2)
Total Protein: 5.5 g/dL — ABNORMAL LOW (ref 6.5–8.1)

## 2018-06-25 LAB — SAMPLE TO BLOOD BANK

## 2018-06-25 LAB — PREPARE RBC (CROSSMATCH)

## 2018-06-25 MED ORDER — ACETAMINOPHEN 325 MG PO TABS
650.0000 mg | ORAL_TABLET | Freq: Once | ORAL | Status: AC
  Administered 2018-06-25: 650 mg via ORAL
  Filled 2018-06-25: qty 2

## 2018-06-25 MED ORDER — SODIUM CHLORIDE 0.9% IV SOLUTION
250.0000 mL | Freq: Once | INTRAVENOUS | Status: AC
  Administered 2018-06-25: 250 mL via INTRAVENOUS
  Filled 2018-06-25: qty 250

## 2018-06-25 MED ORDER — HEPARIN SOD (PORK) LOCK FLUSH 100 UNIT/ML IV SOLN
500.0000 [IU] | Freq: Every day | INTRAVENOUS | Status: AC | PRN
  Administered 2018-06-25: 500 [IU]

## 2018-06-25 MED ORDER — SODIUM CHLORIDE 0.9% FLUSH
10.0000 mL | Freq: Once | INTRAVENOUS | Status: AC
  Administered 2018-06-25: 11:00:00 10 mL via INTRAVENOUS
  Filled 2018-06-25: qty 10

## 2018-06-25 MED ORDER — DIPHENHYDRAMINE HCL 50 MG/ML IJ SOLN
25.0000 mg | Freq: Once | INTRAMUSCULAR | Status: DC
  Filled 2018-06-25: qty 1

## 2018-06-25 NOTE — Progress Notes (Signed)
Patient stated that she had been doing well with no complaints. Patient stated that her appetite had been steady and had been able to sleep at night time.

## 2018-06-26 LAB — BPAM RBC
Blood Product Expiration Date: 202006232359
Blood Product Expiration Date: 202006242359
ISSUE DATE / TIME: 202006091315
ISSUE DATE / TIME: 202006091449
Unit Type and Rh: 5100
Unit Type and Rh: 5100

## 2018-06-26 LAB — TYPE AND SCREEN
ABO/RH(D): O POS
Antibody Screen: NEGATIVE
Unit division: 0
Unit division: 0

## 2018-06-28 DIAGNOSIS — Z7189 Other specified counseling: Secondary | ICD-10-CM | POA: Insufficient documentation

## 2018-06-30 NOTE — Progress Notes (Signed)
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  INTERVAL HISTORY: Patient returns to clinic today for further evaluation and consideration of her next infusion of gemcitabine. She continues to have a poor performance status and significant weakness and fatigue.   She has a chronic peripheral neuropathy which is slightly improved, but no other neurologic complaints.  She denies any recent fevers or illnesses.  She denies any chest pain, shortness of breath or hemoptysis.  She continues to have occasional cough, particularly with eating.  She denies any nausea, vomiting, constipation, or diarrhea. She has no urinary complaints.  Patient continues to feel generally terrible, but offers no further specific complaints today.  REVIEW OF SYSTEMS:   Review of Systems  Constitutional: Positive for malaise/fatigue and weight loss. Negative for fever.  Eyes: Negative.  Negative for blurred vision, double vision and pain.  Respiratory: Positive for cough. Negative for shortness of breath.   Cardiovascular: Negative.  Negative for chest pain and leg swelling.  Gastrointestinal: Negative.  Negative for abdominal pain.  Genitourinary: Negative.  Negative for dysuria and flank pain.  Musculoskeletal: Negative for back pain and falls.  Skin: Negative.  Negative for rash.  Neurological: Positive for tingling, sensory change and weakness. Negative for focal weakness and headaches.  Endo/Heme/Allergies: Negative.   Psychiatric/Behavioral: Positive for memory loss. The patient is not nervous/anxious and does not have insomnia.     As per HPI. Otherwise, a complete review of  systems is negative.  PAST MEDICAL HISTORY: Past Medical History:  Diagnosis Date   Anxiety    Back pain    occasionally   Breast cancer ([place]) 2009   left   Depression    takes Paxil and Wellbutrin daily   Diabetes ([place])    takes Metformin daily   type 2    Genetic testing 02/03/2017   Multi-Cancer panel (83 genes) @ [facility] - No pathogenic mutations detected   GERD (gastroesophageal reflux disease)    occasional   History of bronchitis 2 yrs ago   History of kidney stones    Hyperlipidemia    takes Atorvastatin daily   Hypertension    no meds   Insomnia    takes Ambien nightly   Insomnia    takes gabapentin nightly   Joint pain    Mood swings    Osteoarthritis of knee    Personal history of chemotherapy 12/05/2016   Mets from Breast Cancer   Personal history of radiation therapy 11/2016   Pneumonia    PONV (postoperative nausea and vomiting)    Seasonal allergies    takes Allegra daily    PAST SURGICAL HISTORY: Past Surgical History:  Procedure Laterality Date   BREAST BIOPSY  2009   cataract surgery Bilateral    COLONOSCOPY     IR RADIOLOGIST EVAL & MGMT  01/29/2018   JOINT REPLACEMENT Left 2014   knee   KNEE ARTHROSCOPY Left    x 5   MASTECTOMY Left    port a cath placed     PORTACATH PLACEMENT N/A 09/17/2015   Procedure: INSERTION PORT-A-CATH;  Surgeon: Jules Husbands, MD;  Location: [facility];  Service: General;  Laterality: N/A;   RADIOLOGY WITH ANESTHESIA  N/A 02/20/2018   Procedure: CT WITH ANESTHESIA MICROWAVE THERMAL ABLATION-LIVER;  Surgeon: Jacqulynn Cadet, MD;  Location: [facility];  Service: Anesthesiology;  Laterality: N/A;   TOOTH EXTRACTION     TOTAL SHOULDER ARTHROPLASTY Right 06/03/2015   Procedure: TOTAL SHOULDER ARTHROPLASTY;  Surgeon: Tania Ade, MD;  Location: [place];  Service: Orthopedics;  Laterality: Right;  Right total shoulder arthroplasty   TOTAL SHOULDER REPLACEMENT Right 06/03/2015   TUBAL LIGATION       FAMILY HISTORY: Father with non-Hodgkin's lymphoma, 2 paternal aunts with breast cancer.     ADVANCED DIRECTIVES:    HEALTH MAINTENANCE: Social History   Tobacco Use   Smoking status: Never Smoker   Smokeless tobacco: Never Used  Substance Use Topics   Alcohol use: Yes    Alcohol/week: 0.0 standard drinks    Comment: occasionally wine   Drug use: Yes    Types: Marijuana    Comment: cannabis with no extra  low dose edibles  for neuropathy     Colonoscopy:  PAP:  Bone density:  Lipid panel:  Allergies  Allergen Reactions   Ace Inhibitors Cough   Latex Itching   Morphine And Related Itching    Caused her to itch terribly. Would prefer if given to take with a benadryl   Penicillins Rash    Has patient had a PCN reaction causing immediate rash, facial/tongue/throat swelling, SOB or lightheadedness with hypotension: No Has patient had a PCN reaction causing severe rash involving mucus membranes or skin necrosis: No Has patient had a PCN reaction that required hospitalization No Has patient had a PCN reaction occurring within the last 10 years: No If all of the above answers are "NO", then may proceed with Cephalosporin use.    Current Outpatient Medications  Medication Sig Dispense Refill   acetaminophen (TYLENOL) 500 MG tablet Take 500 mg by mouth every 6 (six) hours as needed for mild pain or moderate pain.      atorvastatin (LIPITOR) 10 MG tablet Take 10 mg by mouth at bedtime.      buPROPion (WELLBUTRIN XL) 150 MG 24 hr tablet Take 150 mg by mouth at bedtime.      Calcium-Magnesium-Vitamin D (CALCIUM 1200+D3 PO) Take 1 tablet by mouth daily.      Cyanocobalamin 5000 MCG CAPS Take 5,000 mcg by mouth daily.      fexofenadine (ALLEGRA) 180 MG tablet Take 180 mg by mouth at bedtime.      gabapentin (NEURONTIN) 300 MG capsule Take 300-600 mg by mouth 2 (two) times daily. Take 300 mg by mouth in the morning and 375m at night     levofloxacin  (LEVAQUIN) 500 MG tablet Take 1 tablet (500 mg total) by mouth daily. 14 tablet 0   lidocaine-prilocaine (EMLA) cream Apply 1 application topically as needed. Apply to port 1-2 hours prior to chemotherapy appointment. Cover with plastic wrap. 30 g 0   metFORMIN (GLUCOPHAGE) 850 MG tablet Take 850 mg by mouth 2 (two) times daily with a meal.      Oxycodone HCl 10 MG TABS Take 1 tablet (10 mg total) by mouth every 4 (four) hours as needed. Ok to take every 4-6 hours as needed. 90 tablet 0   pantoprazole (PROTONIX) 40 MG tablet Take 40 mg by mouth at bedtime.      PARoxetine (PAXIL) 40 MG tablet Take 40 mg by mouth at bedtime.      prochlorperazine (COMPAZINE) 10 MG tablet TAKE 1 TABLET BY MOUTH EVERY 6 HOURS  AS NEEDED FOR  NAUSEA OR  VOMITING 120 tablet 1   sodium chloride (MURO 128) 5 % ophthalmic solution Place 1 drop into both eyes 4 (four) times daily.     zolpidem (AMBIEN) 10 MG tablet Take 1 tablet (10 mg total) by mouth at bedtime as needed (sleep). 90 tablet 0   No current facility-administered medications for this visit.     OBJECTIVE: Vitals:   07/02/18 1002  BP: 124/76  Pulse: (!) 114  Resp: 18     Body mass index is 24.89 kg/m.    ECOG FS:3 - Symptomatic, >50% confined to bed  General: Thin, no acute distress. Eyes: Pink conjunctiva, anicteric sclera. HEENT: Normocephalic, moist mucous membranes, clear oropharnyx. Lungs: Clear to auscultation bilaterally. Heart: Regular rate and rhythm. No rubs, murmurs, or gallops. Abdomen: Soft, nontender, nondistended. No organomegaly noted, normoactive bowel sounds. Musculoskeletal: No edema, cyanosis, or clubbing. Neuro: Alert, answering all questions appropriately. Cranial nerves grossly intact. Skin: No rashes or petechiae noted. Psych: Flat affect.   LAB RESULTS:  Lab Results  Component Value Date   NA 137 07/02/2018   K 4.3 07/02/2018   CL 105 07/02/2018   CO2 23 07/02/2018   GLUCOSE 231 (H) 07/02/2018   BUN 23  07/02/2018   CREATININE 0.85 07/02/2018   CALCIUM 8.3 (L) 07/02/2018   PROT 5.7 (L) 07/02/2018   ALBUMIN 3.2 (L) 07/02/2018   AST 51 (H) 07/02/2018   ALT 40 07/02/2018   ALKPHOS 129 (H) 07/02/2018   BILITOT 0.5 07/02/2018   GFRNONAA >60 07/02/2018   GFRAA >60 07/02/2018    Lab Results  Component Value Date   WBC 4.9 07/02/2018   NEUTROABS 3.5 07/02/2018   HGB 8.4 (L) 07/02/2018   HCT 28.2 (L) 07/02/2018   MCV 95.9 07/02/2018   PLT 251 07/02/2018     STUDIES: Mr Jeri Cos YS Contrast  Result Date: 06/03/2018 CLINICAL DATA:  68 year old female with metastatic breast cancer. Progressive weakness and fatigue. EXAM: MRI HEAD WITHOUT AND WITH CONTRAST TECHNIQUE: Multiplanar, multiecho pulse sequences of the brain and surrounding structures were obtained without and with intravenous contrast. CONTRAST:  7 milliliters Gadavist COMPARISON:  Brain MRI 12/21/2017 and earlier. FINDINGS: Brain: Stable cerebral volume. No restricted diffusion to suggest acute infarction. No midline shift, mass effect, evidence of mass lesion, ventriculomegaly, extra-axial collection or acute intracranial hemorrhage. Cervicomedullary junction and pituitary are within normal limits. Stable gray-white matter differentiation throughout the brain. Scattered mild for age nonspecific cerebral white matter T2 and FLAIR hyperintensity. No cortical encephalomalacia or chronic cerebral blood products identified. No abnormal intra-axial enhancement. No dural thickening identified. Vascular: Major intracranial vascular flow voids are stable. The right vertebral artery is dominant. The major dural venous sinuses are enhancing and appear to be patent. Skull and upper cervical spine: Generalized abnormal diffusion in the calvarium and progressed abnormal T1 bone marrow signal compatible with widespread osseous metastatic disease. Progressed clival metastasis, bilateral occipital condyle involvement, and progressed and diffuse visible  cervical vertebral involvement. Negative visible spinal cord. Sinuses/Orbits: Stable with evidence of chronic left lamina papyracea fracture. Other: Mastoids remain clear. Visible internal auditory structures appear normal. Scalp and face soft tissues appear negative. IMPRESSION: 1. Progressed and fairly diffuse osseous metastatic disease since December 2019. 2. Stable brain with no metastatic disease to the brain parenchyma or the dura identified. Electronically Signed   By: Genevie Ann M.D.   On: 06/03/2018 23:35    ASSESSMENT:  Progressive ER/PR positive, HER-2 negative with metastatic disease  in liver and bone.  PLAN:    1.  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  MRI of the brain on Jun 03, 2018 did not reveal metastatic disease.  Hospice and end-of-life care have been discussed on multiple occasions, but patient declined and wished to try additional chemotherapy.  She expressed understanding that given her performance status, there is likely no additional options after trying single agent gemcitabine.  Given patient's persistent pancytopenia, she will likely only be able to tolerate treatment every other week.  Proceed with cycle 3 of gemcitabine.  Return to clinic in 1 week for laboratory and possible blood transfusion and then in 2 weeks for further evaluation and consideration of cycle 4.   2.  Anemia: Hemoglobin improved to 8.4. 3.  Peripheral neuropathy: Chronic and unchanged.   4.  Back pain: MRI results from May 04, 2017 did not reveal metastatic disease.  Continue symptomatic treatment.  5.  Left IJ clot: Ultrasound results reviewed independently.  Patient has been instructed to discontinue Eliquis. 6.  Neutropenia: Resolved. 7.  Memory complaints/confusion: Repeat MRI of the brain on Jun 03, 2018 reviewed  independently with no obvious intracranial abnormality.  Patient continues to have progressive and persistent osseous metastatic disease. 8.  Elevated liver enzymes: Chronic and unchanged.  Only AST remains mildly elevated to 51.  Gemcitabine does not need to be dose reduced in the setting of liver dysfunction. 9.  Apparent aspiration: Despite a recent normal barium swallow, patient still describes symptoms of significant aspiration with eating.  A referral has been sent to speech pathology for further evaluation. 10.  Poor appetite: Patient has tried Remeron and dexamethasone without much effect.  She only took 2 days of Megace prior to discontinuing.  Patient does not wish to retry Megace at this time.  Appreciate dietary input.   11.  Insomnia: Improved.  Continue Ambien as prescribed.   12.  Bilateral lung opacities: Patient has completed a 14-day course of Levaquin.  Patient expressed understanding and was in agreement with this plan. She also understands that She can call clinic at any time with any questions, concerns, or complaints.   Breast cancer   Staging form: Breast, AJCC 7th Edition     Pathologic stage from 08/11/2014: Stage IIIA (T0, N2a, cM0) - Signed by Lloyd Huger, MD on 08/11/2014   Lloyd Huger, MD   07/03/2018 6:30 AM

## 2018-07-02 ENCOUNTER — Other Ambulatory Visit: Payer: Self-pay | Admitting: *Deleted

## 2018-07-02 ENCOUNTER — Inpatient Hospital Stay (HOSPITAL_BASED_OUTPATIENT_CLINIC_OR_DEPARTMENT_OTHER): Payer: Medicare Other | Admitting: Oncology

## 2018-07-02 ENCOUNTER — Inpatient Hospital Stay: Payer: Medicare Other

## 2018-07-02 ENCOUNTER — Encounter: Payer: Self-pay | Admitting: Oncology

## 2018-07-02 ENCOUNTER — Other Ambulatory Visit: Payer: Self-pay

## 2018-07-02 VITALS — BP 124/76 | HR 114 | Resp 18 | Wt 145.0 lb

## 2018-07-02 VITALS — BP 120/74 | HR 98 | Temp 96.9°F | Resp 20

## 2018-07-02 DIAGNOSIS — R5383 Other fatigue: Secondary | ICD-10-CM

## 2018-07-02 DIAGNOSIS — R4182 Altered mental status, unspecified: Secondary | ICD-10-CM

## 2018-07-02 DIAGNOSIS — C50919 Malignant neoplasm of unspecified site of unspecified female breast: Secondary | ICD-10-CM

## 2018-07-02 DIAGNOSIS — Z515 Encounter for palliative care: Secondary | ICD-10-CM | POA: Diagnosis not present

## 2018-07-02 DIAGNOSIS — Z170 Estrogen receptor positive status [ER+]: Secondary | ICD-10-CM | POA: Diagnosis not present

## 2018-07-02 DIAGNOSIS — Z7189 Other specified counseling: Secondary | ICD-10-CM

## 2018-07-02 DIAGNOSIS — D649 Anemia, unspecified: Secondary | ICD-10-CM | POA: Diagnosis not present

## 2018-07-02 DIAGNOSIS — C7951 Secondary malignant neoplasm of bone: Secondary | ICD-10-CM | POA: Diagnosis not present

## 2018-07-02 DIAGNOSIS — C787 Secondary malignant neoplasm of liver and intrahepatic bile duct: Secondary | ICD-10-CM | POA: Diagnosis not present

## 2018-07-02 DIAGNOSIS — R531 Weakness: Secondary | ICD-10-CM

## 2018-07-02 DIAGNOSIS — R05 Cough: Secondary | ICD-10-CM | POA: Diagnosis not present

## 2018-07-02 DIAGNOSIS — G620 Drug-induced polyneuropathy: Secondary | ICD-10-CM | POA: Diagnosis not present

## 2018-07-02 DIAGNOSIS — Z5111 Encounter for antineoplastic chemotherapy: Secondary | ICD-10-CM | POA: Diagnosis not present

## 2018-07-02 LAB — CBC WITH DIFFERENTIAL/PLATELET
Abs Immature Granulocytes: 0.04 10*3/uL (ref 0.00–0.07)
Basophils Absolute: 0 10*3/uL (ref 0.0–0.1)
Basophils Relative: 0 %
Eosinophils Absolute: 0 10*3/uL (ref 0.0–0.5)
Eosinophils Relative: 1 %
HCT: 28.2 % — ABNORMAL LOW (ref 36.0–46.0)
Hemoglobin: 8.4 g/dL — ABNORMAL LOW (ref 12.0–15.0)
Immature Granulocytes: 1 %
Lymphocytes Relative: 12 %
Lymphs Abs: 0.6 10*3/uL — ABNORMAL LOW (ref 0.7–4.0)
MCH: 28.6 pg (ref 26.0–34.0)
MCHC: 29.8 g/dL — ABNORMAL LOW (ref 30.0–36.0)
MCV: 95.9 fL (ref 80.0–100.0)
Monocytes Absolute: 0.8 10*3/uL (ref 0.1–1.0)
Monocytes Relative: 16 %
Neutro Abs: 3.5 10*3/uL (ref 1.7–7.7)
Neutrophils Relative %: 70 %
Platelets: 251 10*3/uL (ref 150–400)
RBC: 2.94 MIL/uL — ABNORMAL LOW (ref 3.87–5.11)
RDW: 18.9 % — ABNORMAL HIGH (ref 11.5–15.5)
WBC: 4.9 10*3/uL (ref 4.0–10.5)
nRBC: 0 % (ref 0.0–0.2)

## 2018-07-02 LAB — COMPREHENSIVE METABOLIC PANEL
ALT: 40 U/L (ref 0–44)
AST: 51 U/L — ABNORMAL HIGH (ref 15–41)
Albumin: 3.2 g/dL — ABNORMAL LOW (ref 3.5–5.0)
Alkaline Phosphatase: 129 U/L — ABNORMAL HIGH (ref 38–126)
Anion gap: 9 (ref 5–15)
BUN: 23 mg/dL (ref 8–23)
CO2: 23 mmol/L (ref 22–32)
Calcium: 8.3 mg/dL — ABNORMAL LOW (ref 8.9–10.3)
Chloride: 105 mmol/L (ref 98–111)
Creatinine, Ser: 0.85 mg/dL (ref 0.44–1.00)
GFR calc Af Amer: >60 mL/min (ref >60)
GFR calc non Af Amer: >60 mL/min (ref >60)
Glucose, Bld: 231 mg/dL — ABNORMAL HIGH (ref 70–99)
Potassium: 4.3 mmol/L (ref 3.5–5.1)
Sodium: 137 mmol/L (ref 135–145)
Total Bilirubin: 0.5 mg/dL (ref 0.3–1.2)
Total Protein: 5.7 g/dL — ABNORMAL LOW (ref 6.5–8.1)

## 2018-07-02 MED ORDER — ZOLPIDEM TARTRATE 10 MG PO TABS: 10.0000 mg | ORAL_TABLET | Freq: Every evening | ORAL | 0 refills | Status: DC | PRN

## 2018-07-02 MED ORDER — PROCHLORPERAZINE MALEATE 10 MG PO TABS
10.0000 mg | ORAL_TABLET | Freq: Once | ORAL | Status: AC
  Administered 2018-07-02: 10 mg via ORAL
  Filled 2018-07-02: qty 1

## 2018-07-02 MED ORDER — SODIUM CHLORIDE 0.9 % IV SOLN
Freq: Once | INTRAVENOUS | Status: AC
  Administered 2018-07-02: 11:00:00 via INTRAVENOUS
  Filled 2018-07-02: qty 250

## 2018-07-02 MED ORDER — SODIUM CHLORIDE 0.9% FLUSH
10.0000 mL | Freq: Once | INTRAVENOUS | Status: AC
  Administered 2018-07-02: 09:00:00 10 mL via INTRAVENOUS
  Filled 2018-07-02: qty 10

## 2018-07-02 MED ORDER — SODIUM CHLORIDE 0.9 % IV SOLN
1400.0000 mg | Freq: Once | INTRAVENOUS | Status: AC
  Administered 2018-07-02: 1400 mg via INTRAVENOUS
  Filled 2018-07-02: qty 26.3

## 2018-07-02 MED ORDER — HEPARIN SOD (PORK) LOCK FLUSH 100 UNIT/ML IV SOLN
500.0000 [IU] | Freq: Once | INTRAVENOUS | Status: AC
  Administered 2018-07-02: 500 [IU] via INTRAVENOUS
  Filled 2018-07-02: qty 5

## 2018-07-02 MED ORDER — ZOLPIDEM TARTRATE 10 MG PO TABS: 10.0000 mg | ORAL_TABLET | Freq: Every evening | ORAL | 0 refills | Status: AC | PRN

## 2018-07-02 MED ORDER — PROCHLORPERAZINE MALEATE 10 MG PO TABS: ORAL_TABLET | ORAL | 1 refills | Status: AC

## 2018-07-02 NOTE — Progress Notes (Signed)
Patient denies any concerns today.  

## 2018-07-03 LAB — SAMPLE TO BLOOD BANK

## 2018-07-07 ENCOUNTER — Other Ambulatory Visit: Payer: Self-pay | Admitting: Oncology

## 2018-07-08 ENCOUNTER — Ambulatory Visit (HOSPITAL_BASED_OUTPATIENT_CLINIC_OR_DEPARTMENT_OTHER): Payer: Medicare Other | Admitting: Hospice and Palliative Medicine

## 2018-07-08 ENCOUNTER — Telehealth: Payer: Self-pay | Admitting: *Deleted

## 2018-07-08 DIAGNOSIS — Z515 Encounter for palliative care: Secondary | ICD-10-CM | POA: Diagnosis not present

## 2018-07-08 DIAGNOSIS — C50919 Malignant neoplasm of unspecified site of unspecified female breast: Secondary | ICD-10-CM

## 2018-07-08 DIAGNOSIS — R531 Weakness: Secondary | ICD-10-CM

## 2018-07-08 DIAGNOSIS — Z710 Person encountering health services to consult on behalf of another person: Secondary | ICD-10-CM | POA: Diagnosis not present

## 2018-07-08 NOTE — Telephone Encounter (Signed)
Josh, patients husband called requesting you to call to discuss patients status with him. Thanks.

## 2018-07-09 ENCOUNTER — Other Ambulatory Visit: Payer: Self-pay

## 2018-07-09 NOTE — Progress Notes (Signed)
Virtual Visit via Telephone Note  I connected with husband for Amanda [name] on 07/09/18 at  2:00 PM EDT by telephone and verified that I am speaking with the correct person using two identifiers.   I discussed the limitations, risks, security and privacy concerns of performing an evaluation and management service by telephone and the availability of in person appointments. I also discussed with the patient that there may be a patient responsible charge related to this service. The patient expressed understanding and agreed to proceed.   History of Present Illness: Palliative Care consult requested for this 68 y.o. 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  She was found to have disease progression on Ibrance and letrozole.  Patient previously had verbalized a desire not to pursue further treatment in the event of disease progression.  However, she is decided to start [place] and was most recently transitioned to single agent gemcitabine.  Palliative care was consulted to help address goals and follow for support.   Observations/Objective: Husband says that patient has become increasingly weak. He is managing care alone and asked about resources available to assist. We discussed options of home health vs hospice vs palliative care. Husband would be interested in home health PT/OT/SN/CNA and palliative care. He says that patient is still interested in pursuing active treatment for now but that he anticipates a transition to hospice in the near future. His goal remains her comfort.   We discussed DME needs. I suspect that patient would benefit from a hospital bed but he wanted to think about it.   Assessment and Plan: -Home health referral for PT/OT/SN/CNA -Home-Based palliative care  Follow Up Instructions: RTC as needed   I discussed the assessment and treatment plan with the patient. The patient was provided an opportunity to ask  questions and all were answered. The patient agreed with the plan and demonstrated an understanding of the instructions.   The patient was advised to call back or seek an in-person evaluation if the symptoms worsen or if the condition fails to improve as anticipated.  I provided 15 minutes of non-face-to-face time during this encounter.   Irean Hong, NP

## 2018-07-10 ENCOUNTER — Inpatient Hospital Stay: Payer: Medicare Other

## 2018-07-10 ENCOUNTER — Other Ambulatory Visit: Payer: Self-pay

## 2018-07-10 ENCOUNTER — Other Ambulatory Visit: Payer: Self-pay | Admitting: Oncology

## 2018-07-10 DIAGNOSIS — Z515 Encounter for palliative care: Secondary | ICD-10-CM | POA: Diagnosis not present

## 2018-07-10 DIAGNOSIS — D649 Anemia, unspecified: Secondary | ICD-10-CM

## 2018-07-10 DIAGNOSIS — Z170 Estrogen receptor positive status [ER+]: Secondary | ICD-10-CM | POA: Diagnosis not present

## 2018-07-10 DIAGNOSIS — Z5111 Encounter for antineoplastic chemotherapy: Secondary | ICD-10-CM | POA: Diagnosis not present

## 2018-07-10 DIAGNOSIS — C787 Secondary malignant neoplasm of liver and intrahepatic bile duct: Secondary | ICD-10-CM | POA: Diagnosis not present

## 2018-07-10 DIAGNOSIS — C50919 Malignant neoplasm of unspecified site of unspecified female breast: Secondary | ICD-10-CM | POA: Diagnosis not present

## 2018-07-10 DIAGNOSIS — C7951 Secondary malignant neoplasm of bone: Secondary | ICD-10-CM | POA: Diagnosis not present

## 2018-07-10 LAB — CBC WITH DIFFERENTIAL/PLATELET
Abs Immature Granulocytes: 0.19 10*3/uL — ABNORMAL HIGH (ref 0.00–0.07)
Basophils Absolute: 0 10*3/uL (ref 0.0–0.1)
Basophils Relative: 1 %
Eosinophils Absolute: 0 10*3/uL (ref 0.0–0.5)
Eosinophils Relative: 0 %
HCT: 25.4 % — ABNORMAL LOW (ref 36.0–46.0)
Hemoglobin: 7.4 g/dL — ABNORMAL LOW (ref 12.0–15.0)
Immature Granulocytes: 4 %
Lymphocytes Relative: 14 %
Lymphs Abs: 0.7 10*3/uL (ref 0.7–4.0)
MCH: 28 pg (ref 26.0–34.0)
MCHC: 29.1 g/dL — ABNORMAL LOW (ref 30.0–36.0)
MCV: 96.2 fL (ref 80.0–100.0)
Monocytes Absolute: 0.9 10*3/uL (ref 0.1–1.0)
Monocytes Relative: 19 %
Neutro Abs: 3.1 10*3/uL (ref 1.7–7.7)
Neutrophils Relative %: 62 %
Platelets: 219 10*3/uL (ref 150–400)
RBC: 2.64 MIL/uL — ABNORMAL LOW (ref 3.87–5.11)
RDW: 18.9 % — ABNORMAL HIGH (ref 11.5–15.5)
WBC: 4.9 10*3/uL (ref 4.0–10.5)
nRBC: 1 % — ABNORMAL HIGH (ref 0.0–0.2)

## 2018-07-10 LAB — COMPREHENSIVE METABOLIC PANEL
ALT: 57 U/L — ABNORMAL HIGH (ref 0–44)
AST: 72 U/L — ABNORMAL HIGH (ref 15–41)
Albumin: 3 g/dL — ABNORMAL LOW (ref 3.5–5.0)
Alkaline Phosphatase: 153 U/L — ABNORMAL HIGH (ref 38–126)
Anion gap: 7 (ref 5–15)
BUN: 24 mg/dL — ABNORMAL HIGH (ref 8–23)
CO2: 25 mmol/L (ref 22–32)
Calcium: 8.4 mg/dL — ABNORMAL LOW (ref 8.9–10.3)
Chloride: 103 mmol/L (ref 98–111)
Creatinine, Ser: 0.93 mg/dL (ref 0.44–1.00)
GFR calc Af Amer: >60 mL/min (ref >60)
GFR calc non Af Amer: >60 mL/min (ref >60)
Glucose, Bld: 248 mg/dL — ABNORMAL HIGH (ref 70–99)
Potassium: 4.6 mmol/L (ref 3.5–5.1)
Sodium: 135 mmol/L (ref 135–145)
Total Bilirubin: 0.4 mg/dL (ref 0.3–1.2)
Total Protein: 5.6 g/dL — ABNORMAL LOW (ref 6.5–8.1)

## 2018-07-10 LAB — SAMPLE TO BLOOD BANK

## 2018-07-10 LAB — PREPARE RBC (CROSSMATCH)

## 2018-07-10 MED ORDER — ACETAMINOPHEN 325 MG PO TABS
650.0000 mg | ORAL_TABLET | Freq: Once | ORAL | Status: AC
  Administered 2018-07-10: 12:00:00 650 mg via ORAL
  Filled 2018-07-10: qty 2

## 2018-07-10 MED ORDER — HEPARIN SOD (PORK) LOCK FLUSH 100 UNIT/ML IV SOLN
500.0000 [IU] | Freq: Once | INTRAVENOUS | Status: AC
  Administered 2018-07-10: 500 [IU] via INTRAVENOUS
  Filled 2018-07-10: qty 5

## 2018-07-10 MED ORDER — SODIUM CHLORIDE 0.9% FLUSH
10.0000 mL | Freq: Once | INTRAVENOUS | Status: AC
  Administered 2018-07-10: 09:00:00 10 mL via INTRAVENOUS
  Filled 2018-07-10: qty 10

## 2018-07-10 MED ORDER — DIPHENHYDRAMINE HCL 50 MG/ML IJ SOLN
25.0000 mg | Freq: Once | INTRAMUSCULAR | Status: DC
  Filled 2018-07-10: qty 1

## 2018-07-10 MED ORDER — SODIUM CHLORIDE 0.9% IV SOLUTION
250.0000 mL | Freq: Once | INTRAVENOUS | Status: AC
  Administered 2018-07-10: 12:00:00 250 mL via INTRAVENOUS
  Filled 2018-07-10: qty 250

## 2018-07-11 ENCOUNTER — Telehealth: Payer: Self-pay | Admitting: Nurse Practitioner

## 2018-07-11 LAB — TYPE AND SCREEN
ABO/RH(D): O POS
Antibody Screen: NEGATIVE
Unit division: 0

## 2018-07-11 LAB — BPAM RBC
Blood Product Expiration Date: 202006262359
ISSUE DATE / TIME: 202006241151
Unit Type and Rh: 9500

## 2018-07-11 NOTE — Telephone Encounter (Signed)
Talked with patient's husband [name] and we have scheduled a Telephone Consult for 07/22/18 @ 12:30 PM.  Verbal consent rec'd by husband for Palliative services

## 2018-07-13 DIAGNOSIS — D630 Anemia in neoplastic disease: Secondary | ICD-10-CM | POA: Diagnosis not present

## 2018-07-13 DIAGNOSIS — Z170 Estrogen receptor positive status [ER+]: Secondary | ICD-10-CM | POA: Diagnosis not present

## 2018-07-13 DIAGNOSIS — G4700 Insomnia, unspecified: Secondary | ICD-10-CM | POA: Diagnosis not present

## 2018-07-13 DIAGNOSIS — Z7984 Long term (current) use of oral hypoglycemic drugs: Secondary | ICD-10-CM | POA: Diagnosis not present

## 2018-07-13 DIAGNOSIS — M170 Bilateral primary osteoarthritis of knee: Secondary | ICD-10-CM | POA: Diagnosis not present

## 2018-07-13 DIAGNOSIS — C50912 Malignant neoplasm of unspecified site of left female breast: Secondary | ICD-10-CM | POA: Diagnosis not present

## 2018-07-13 DIAGNOSIS — E785 Hyperlipidemia, unspecified: Secondary | ICD-10-CM | POA: Diagnosis not present

## 2018-07-13 DIAGNOSIS — F419 Anxiety disorder, unspecified: Secondary | ICD-10-CM | POA: Diagnosis not present

## 2018-07-13 DIAGNOSIS — F329 Major depressive disorder, single episode, unspecified: Secondary | ICD-10-CM | POA: Diagnosis not present

## 2018-07-13 DIAGNOSIS — R630 Anorexia: Secondary | ICD-10-CM | POA: Diagnosis not present

## 2018-07-13 DIAGNOSIS — E1142 Type 2 diabetes mellitus with diabetic polyneuropathy: Secondary | ICD-10-CM | POA: Diagnosis not present

## 2018-07-13 DIAGNOSIS — R1310 Dysphagia, unspecified: Secondary | ICD-10-CM | POA: Diagnosis not present

## 2018-07-13 DIAGNOSIS — C7951 Secondary malignant neoplasm of bone: Secondary | ICD-10-CM | POA: Diagnosis not present

## 2018-07-13 DIAGNOSIS — R413 Other amnesia: Secondary | ICD-10-CM | POA: Diagnosis not present

## 2018-07-13 DIAGNOSIS — C787 Secondary malignant neoplasm of liver and intrahepatic bile duct: Secondary | ICD-10-CM | POA: Diagnosis not present

## 2018-07-13 DIAGNOSIS — M6281 Muscle weakness (generalized): Secondary | ICD-10-CM | POA: Diagnosis not present

## 2018-07-13 DIAGNOSIS — I10 Essential (primary) hypertension: Secondary | ICD-10-CM | POA: Diagnosis not present

## 2018-07-13 DIAGNOSIS — K219 Gastro-esophageal reflux disease without esophagitis: Secondary | ICD-10-CM | POA: Diagnosis not present

## 2018-07-13 DIAGNOSIS — Z9012 Acquired absence of left breast and nipple: Secondary | ICD-10-CM | POA: Diagnosis not present

## 2018-07-14 NOTE — Progress Notes (Signed)
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  INTERVAL HISTORY: Patient returns to clinic today for further evaluation and consideration of her next infusion of gemcitabine.  She continues to have a declining performance status with significant weakness and fatigue.  Home health and home physical therapy have recently been initiated.  She has a chronic peripheral neuropathy which is slightly improved, but no other neurologic complaints.  She denies any recent fevers or illnesses.  She denies any chest pain, shortness of breath or hemoptysis.  She continues to have occasional cough, particularly with eating.  She denies any nausea, vomiting, constipation, or diarrhea. She has no urinary complaints.  Patient continues to feel generally terrible, but offers no further specific complaints.  REVIEW OF SYSTEMS:   Review of Systems  Constitutional: Positive for malaise/fatigue and weight loss. Negative for fever.  Eyes: Negative.  Negative for blurred vision, double vision and pain.  Respiratory: Positive for cough. Negative for shortness of breath.   Cardiovascular: Negative.  Negative for chest pain and leg swelling.  Gastrointestinal: Negative.  Negative for abdominal pain.  Genitourinary: Negative.  Negative for dysuria and flank pain.  Musculoskeletal: Negative for back pain and falls.  Skin: Negative.  Negative for rash.  Neurological: Positive for tingling, sensory change and weakness. Negative for focal weakness and headaches.  Endo/Heme/Allergies: Negative.   Psychiatric/Behavioral: Positive for memory loss. The patient is not nervous/anxious and does  not have insomnia.     As per HPI. Otherwise, a complete review of systems is negative.  PAST MEDICAL HISTORY: Past Medical History:  Diagnosis Date  . Anxiety   . Back pain    occasionally  . Breast cancer ([place]) 2009   left  . Depression    takes Paxil and Wellbutrin daily  . Diabetes ([place])    takes Metformin daily   type 2   . Genetic testing 02/03/2017   Multi-Cancer panel (83 genes) @ [facility] - No pathogenic mutations detected  . GERD (gastroesophageal reflux disease)    occasional  . History of bronchitis 2 yrs ago  . History of kidney stones   . Hyperlipidemia    takes Atorvastatin daily  . Hypertension    no meds  . Insomnia    takes Ambien nightly  . Insomnia    takes gabapentin nightly  . Joint pain   . Mood swings   . Osteoarthritis of knee   . Personal history of chemotherapy 12/05/2016   Mets from Breast Cancer  . Personal history of radiation therapy 11/2016  . Pneumonia   . PONV (postoperative nausea and vomiting)   . Seasonal allergies    takes Allegra daily    PAST SURGICAL HISTORY: Past Surgical History:  Procedure Laterality Date  . BREAST BIOPSY  2009  . cataract surgery Bilateral   . COLONOSCOPY    . IR RADIOLOGIST EVAL & MGMT  01/29/2018  . JOINT REPLACEMENT Left 2014   knee  . KNEE ARTHROSCOPY Left    x 5  . MASTECTOMY Left   . port a cath placed    . PORTACATH PLACEMENT N/A 09/17/2015   Procedure: INSERTION PORT-A-CATH;  Surgeon: Jules Husbands, MD;  Location: [facility] ORS;  Service: General;  Laterality: N/A;  . RADIOLOGY WITH ANESTHESIA N/A 02/20/2018   Procedure: CT WITH ANESTHESIA MICROWAVE THERMAL ABLATION-LIVER;  Surgeon: Jacqulynn Cadet, MD;  Location: [facility];  Service: Anesthesiology;  Laterality: N/A;  . TOOTH EXTRACTION    . TOTAL SHOULDER ARTHROPLASTY Right 06/03/2015   Procedure: TOTAL SHOULDER ARTHROPLASTY;  Surgeon: Tania Ade, MD;  Location: [place];  Service: Orthopedics;  Laterality: Right;  Right total shoulder  arthroplasty  . TOTAL SHOULDER REPLACEMENT Right 06/03/2015  . TUBAL LIGATION      FAMILY HISTORY: Father with non-Hodgkin's lymphoma, 2 paternal aunts with breast cancer.     ADVANCED DIRECTIVES:    HEALTH MAINTENANCE: Social History   Tobacco Use  . Smoking status: Never Smoker  . Smokeless tobacco: Never Used  Substance Use Topics  . Alcohol use: Yes    Alcohol/week: 0.0 standard drinks    Comment: occasionally wine  . Drug use: Yes    Types: Marijuana    Comment: cannabis with no extra  low dose edibles  for neuropathy     Colonoscopy:  PAP:  Bone density:  Lipid panel:  Allergies  Allergen Reactions  . Ace Inhibitors Cough  . Latex Itching  . Morphine And Related Itching    Caused her to itch terribly. Would prefer if given to take with a benadryl  . Penicillins Rash    Has patient had a PCN reaction causing immediate rash, facial/tongue/throat swelling, SOB or lightheadedness with hypotension: No Has patient had a PCN reaction causing severe rash involving mucus membranes or skin necrosis: No Has patient had a PCN reaction that required hospitalization No Has patient had a PCN reaction occurring within the last 10 years: No If all of the above answers are "NO", then may proceed with Cephalosporin use.    Current Outpatient Medications  Medication Sig Dispense Refill  . acetaminophen (TYLENOL) 500 MG tablet Take 500 mg by mouth every 6 (six) hours as needed for mild pain or moderate pain.     Marland Kitchen atorvastatin (LIPITOR) 10 MG tablet Take 10 mg by mouth at bedtime.     Marland Kitchen buPROPion (WELLBUTRIN XL) 150 MG 24 hr tablet Take 150 mg by mouth at bedtime.     . Calcium-Magnesium-Vitamin D (CALCIUM 1200+D3 PO) Take 1 tablet by mouth daily.     . Cyanocobalamin 5000 MCG CAPS Take 5,000 mcg by mouth daily.     . fexofenadine (ALLEGRA) 180 MG tablet Take 180 mg by mouth at bedtime.     . gabapentin (NEURONTIN) 300 MG capsule Take 300-600 mg by mouth 2 (two) times daily.  Take 300 mg by mouth in the morning and 339m at night    . levofloxacin (LEVAQUIN) 500 MG tablet Take 1 tablet (500 mg total) by mouth daily. 14 tablet 0  . lidocaine-prilocaine (EMLA) cream Apply 1 application topically as needed. Apply to port 1-2 hours prior to chemotherapy appointment. Cover with plastic wrap. 30 g 0  . metFORMIN (GLUCOPHAGE) 850 MG tablet Take 850 mg by mouth 2 (two) times daily with a meal.     . Oxycodone HCl 10 MG TABS Take 1 tablet (10 mg total) by mouth every 4 (four) hours as needed. Ok to take every 4-6 hours as needed. 90 tablet 0  . pantoprazole (PROTONIX) 40 MG tablet Take 40 mg by mouth at bedtime.     .Marland KitchenPARoxetine (PAXIL) 40 MG tablet Take 40 mg by mouth at bedtime.     . prochlorperazine (COMPAZINE) 10  MG tablet TAKE 1 TABLET BY MOUTH EVERY 6 HOURS AS NEEDED FOR  NAUSEA OR  VOMITING 120 tablet 1  . sodium chloride (MURO 128) 5 % ophthalmic solution Place 1 drop into both eyes 4 (four) times daily.    Marland Kitchen zolpidem (AMBIEN) 10 MG tablet Take 1 tablet (10 mg total) by mouth at bedtime as needed (sleep). 90 tablet 0   No current facility-administered medications for this visit.     OBJECTIVE: Vitals:   07/16/18 0943  BP: 112/82  Pulse: 80  Temp: 98.2 F (36.8 C)     Body mass index is 24.37 kg/m.    ECOG FS:3 - Symptomatic, >50% confined to bed  General: Thin, no acute distress. Eyes: Pink conjunctiva, anicteric sclera. HEENT: Normocephalic, moist mucous membranes. Lungs: Clear to auscultation bilaterally. Heart: Regular rate and rhythm. No rubs, murmurs, or gallops. Abdomen: Soft, nontender, nondistended. No organomegaly noted, normoactive bowel sounds. Musculoskeletal: No edema, cyanosis, or clubbing. Neuro: Alert, answering all questions appropriately. Cranial nerves grossly intact. Skin: No rashes or petechiae noted. Psych: Flat affect.  LAB RESULTS:  Lab Results  Component Value Date   NA 137 07/16/2018   K 4.5 07/16/2018   CL 106 07/16/2018    CO2 24 07/16/2018   GLUCOSE 219 (H) 07/16/2018   BUN 29 (H) 07/16/2018   CREATININE 0.87 07/16/2018   CALCIUM 8.2 (L) 07/16/2018   PROT 5.9 (L) 07/16/2018   ALBUMIN 3.2 (L) 07/16/2018   AST 66 (H) 07/16/2018   ALT 49 (H) 07/16/2018   ALKPHOS 115 07/16/2018   BILITOT 0.7 07/16/2018   GFRNONAA >60 07/16/2018   GFRAA >60 07/16/2018    Lab Results  Component Value Date   WBC 8.0 07/16/2018   NEUTROABS 6.4 07/16/2018   HGB 9.5 (L) 07/16/2018   HCT 32.4 (L) 07/16/2018   MCV 96.7 07/16/2018   PLT 328 07/16/2018     STUDIES: No results found.  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  PLAN:    1.  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  MRI of the brain on Jun 03, 2018 did not reveal metastatic disease.  Hospice and end-of-life care have been discussed on multiple occasions, but patient declined and wished to try additional chemotherapy.  She expressed understanding that given her performance status, there is likely no additional options after trying single agent gemcitabine.  Given patient's persistent pancytopenia, she will likely only be able to tolerate treatment every other week.  Proceed with cycle 4 of gemcitabine.  Return to clinic in 1 week for laboratory work and consideration of blood transfusion and then in 2 weeks for further evaluation and consideration of cycle 5.  Will reimage prior to next treatment.   2.  Anemia: Hemoglobin improved to 9.5 with transfusion last week. 3.  Peripheral neuropathy: Chronic and unchanged.   4.  Back pain: MRI results from May 04, 2017 did not reveal metastatic disease.  Continue symptomatic treatment.  5.  Left IJ clot: Ultrasound results reviewed independently.  Patient has been instructed to  discontinue Eliquis. 6.  Neutropenia: Resolved. 7.  Memory complaints/confusion: Repeat MRI of the brain on Jun 03, 2018 reviewed independently with no obvious intracranial abnormality.  Patient continues to have progressive and persistent osseous metastatic disease. 8.  Elevated liver enzymes: Chronic  and unchanged.  Gemcitabine does not need to be dose reduced in the setting of liver dysfunction. 9.  Apparent aspiration: Despite a recent normal barium swallow, patient still describes symptoms of significant aspiration with eating.  A referral has been sent to speech pathology for further evaluation. 10.  Poor appetite: Patient has tried Remeron and dexamethasone without much effect.  She only took 2 days of Megace prior to discontinuing.  Patient does not wish to retry Megace at this time.  Appreciate dietary input.   11.  Insomnia: Improved.  Continue Ambien as prescribed.   12.  Bilateral lung opacities: Patient has completed a 14-day course of Levaquin.  Patient expressed understanding and was in agreement with this plan. She also understands that She can call clinic at any time with any questions, concerns, or complaints.   Breast cancer   Staging form: Breast, AJCC 7th Edition     Pathologic stage from 08/11/2014: Stage IIIA (T0, N2a, cM0) - Signed by Lloyd Huger, MD on 08/11/2014   Lloyd Huger, MD   07/17/2018 6:53 AM

## 2018-07-15 ENCOUNTER — Other Ambulatory Visit: Payer: Self-pay

## 2018-07-15 DIAGNOSIS — E1142 Type 2 diabetes mellitus with diabetic polyneuropathy: Secondary | ICD-10-CM | POA: Diagnosis not present

## 2018-07-15 DIAGNOSIS — C50912 Malignant neoplasm of unspecified site of left female breast: Secondary | ICD-10-CM | POA: Diagnosis not present

## 2018-07-15 DIAGNOSIS — C787 Secondary malignant neoplasm of liver and intrahepatic bile duct: Secondary | ICD-10-CM | POA: Diagnosis not present

## 2018-07-15 DIAGNOSIS — R1310 Dysphagia, unspecified: Secondary | ICD-10-CM | POA: Diagnosis not present

## 2018-07-15 DIAGNOSIS — D630 Anemia in neoplastic disease: Secondary | ICD-10-CM | POA: Diagnosis not present

## 2018-07-15 DIAGNOSIS — C7951 Secondary malignant neoplasm of bone: Secondary | ICD-10-CM | POA: Diagnosis not present

## 2018-07-16 ENCOUNTER — Inpatient Hospital Stay (HOSPITAL_BASED_OUTPATIENT_CLINIC_OR_DEPARTMENT_OTHER): Payer: Medicare Other | Admitting: Oncology

## 2018-07-16 ENCOUNTER — Inpatient Hospital Stay: Payer: Medicare Other

## 2018-07-16 ENCOUNTER — Inpatient Hospital Stay (HOSPITAL_BASED_OUTPATIENT_CLINIC_OR_DEPARTMENT_OTHER): Payer: Medicare Other | Admitting: Hospice and Palliative Medicine

## 2018-07-16 ENCOUNTER — Other Ambulatory Visit: Payer: Self-pay

## 2018-07-16 ENCOUNTER — Encounter: Payer: Self-pay | Admitting: Oncology

## 2018-07-16 VITALS — BP 112/82 | HR 80 | Temp 98.2°F | Ht 64.0 in | Wt 142.0 lb

## 2018-07-16 DIAGNOSIS — R630 Anorexia: Secondary | ICD-10-CM | POA: Diagnosis not present

## 2018-07-16 DIAGNOSIS — D649 Anemia, unspecified: Secondary | ICD-10-CM | POA: Diagnosis not present

## 2018-07-16 DIAGNOSIS — F329 Major depressive disorder, single episode, unspecified: Secondary | ICD-10-CM | POA: Diagnosis not present

## 2018-07-16 DIAGNOSIS — R748 Abnormal levels of other serum enzymes: Secondary | ICD-10-CM

## 2018-07-16 DIAGNOSIS — C787 Secondary malignant neoplasm of liver and intrahepatic bile duct: Secondary | ICD-10-CM | POA: Diagnosis not present

## 2018-07-16 DIAGNOSIS — E1142 Type 2 diabetes mellitus with diabetic polyneuropathy: Secondary | ICD-10-CM | POA: Diagnosis not present

## 2018-07-16 DIAGNOSIS — C50919 Malignant neoplasm of unspecified site of unspecified female breast: Secondary | ICD-10-CM

## 2018-07-16 DIAGNOSIS — E119 Type 2 diabetes mellitus without complications: Secondary | ICD-10-CM

## 2018-07-16 DIAGNOSIS — M549 Dorsalgia, unspecified: Secondary | ICD-10-CM

## 2018-07-16 DIAGNOSIS — C7951 Secondary malignant neoplasm of bone: Secondary | ICD-10-CM | POA: Diagnosis not present

## 2018-07-16 DIAGNOSIS — I10 Essential (primary) hypertension: Secondary | ICD-10-CM

## 2018-07-16 DIAGNOSIS — Z5111 Encounter for antineoplastic chemotherapy: Secondary | ICD-10-CM | POA: Diagnosis not present

## 2018-07-16 DIAGNOSIS — Z515 Encounter for palliative care: Secondary | ICD-10-CM | POA: Diagnosis not present

## 2018-07-16 DIAGNOSIS — C50912 Malignant neoplasm of unspecified site of left female breast: Secondary | ICD-10-CM | POA: Diagnosis not present

## 2018-07-16 DIAGNOSIS — Z170 Estrogen receptor positive status [ER+]: Secondary | ICD-10-CM

## 2018-07-16 DIAGNOSIS — Z923 Personal history of irradiation: Secondary | ICD-10-CM

## 2018-07-16 DIAGNOSIS — M2550 Pain in unspecified joint: Secondary | ICD-10-CM

## 2018-07-16 DIAGNOSIS — D630 Anemia in neoplastic disease: Secondary | ICD-10-CM | POA: Diagnosis not present

## 2018-07-16 DIAGNOSIS — R410 Disorientation, unspecified: Secondary | ICD-10-CM | POA: Diagnosis not present

## 2018-07-16 DIAGNOSIS — E785 Hyperlipidemia, unspecified: Secondary | ICD-10-CM

## 2018-07-16 DIAGNOSIS — Z79899 Other long term (current) drug therapy: Secondary | ICD-10-CM

## 2018-07-16 DIAGNOSIS — K219 Gastro-esophageal reflux disease without esophagitis: Secondary | ICD-10-CM

## 2018-07-16 DIAGNOSIS — R531 Weakness: Secondary | ICD-10-CM

## 2018-07-16 DIAGNOSIS — G4700 Insomnia, unspecified: Secondary | ICD-10-CM

## 2018-07-16 DIAGNOSIS — R1310 Dysphagia, unspecified: Secondary | ICD-10-CM | POA: Diagnosis not present

## 2018-07-16 LAB — COMPREHENSIVE METABOLIC PANEL
ALT: 49 U/L — ABNORMAL HIGH (ref 0–44)
AST: 66 U/L — ABNORMAL HIGH (ref 15–41)
Albumin: 3.2 g/dL — ABNORMAL LOW (ref 3.5–5.0)
Alkaline Phosphatase: 115 U/L (ref 38–126)
Anion gap: 7 (ref 5–15)
BUN: 29 mg/dL — ABNORMAL HIGH (ref 8–23)
CO2: 24 mmol/L (ref 22–32)
Calcium: 8.2 mg/dL — ABNORMAL LOW (ref 8.9–10.3)
Chloride: 106 mmol/L (ref 98–111)
Creatinine, Ser: 0.87 mg/dL (ref 0.44–1.00)
GFR calc Af Amer: >60 mL/min (ref >60)
GFR calc non Af Amer: >60 mL/min (ref >60)
Glucose, Bld: 219 mg/dL — ABNORMAL HIGH (ref 70–99)
Potassium: 4.5 mmol/L (ref 3.5–5.1)
Sodium: 137 mmol/L (ref 135–145)
Total Bilirubin: 0.7 mg/dL (ref 0.3–1.2)
Total Protein: 5.9 g/dL — ABNORMAL LOW (ref 6.5–8.1)

## 2018-07-16 LAB — CBC WITH DIFFERENTIAL/PLATELET
Abs Immature Granulocytes: 0.04 10*3/uL (ref 0.00–0.07)
Basophils Absolute: 0 10*3/uL (ref 0.0–0.1)
Basophils Relative: 0 %
Eosinophils Absolute: 0 10*3/uL (ref 0.0–0.5)
Eosinophils Relative: 0 %
HCT: 32.4 % — ABNORMAL LOW (ref 36.0–46.0)
Hemoglobin: 9.5 g/dL — ABNORMAL LOW (ref 12.0–15.0)
Immature Granulocytes: 1 %
Lymphocytes Relative: 9 %
Lymphs Abs: 0.7 10*3/uL (ref 0.7–4.0)
MCH: 28.4 pg (ref 26.0–34.0)
MCHC: 29.3 g/dL — ABNORMAL LOW (ref 30.0–36.0)
MCV: 96.7 fL (ref 80.0–100.0)
Monocytes Absolute: 0.8 10*3/uL (ref 0.1–1.0)
Monocytes Relative: 10 %
Neutro Abs: 6.4 10*3/uL (ref 1.7–7.7)
Neutrophils Relative %: 80 %
Platelets: 328 10*3/uL (ref 150–400)
RBC: 3.35 MIL/uL — ABNORMAL LOW (ref 3.87–5.11)
RDW: 17.5 % — ABNORMAL HIGH (ref 11.5–15.5)
WBC: 8 10*3/uL (ref 4.0–10.5)
nRBC: 0 % (ref 0.0–0.2)

## 2018-07-16 LAB — SAMPLE TO BLOOD BANK

## 2018-07-16 MED ORDER — SODIUM CHLORIDE 0.9 % IV SOLN
Freq: Once | INTRAVENOUS | Status: AC
  Administered 2018-07-16: 09:00:00 via INTRAVENOUS
  Filled 2018-07-16: qty 250

## 2018-07-16 MED ORDER — SODIUM CHLORIDE 0.9 % IV SOLN
1400.0000 mg | Freq: Once | INTRAVENOUS | Status: AC
  Administered 2018-07-16: 1400 mg via INTRAVENOUS
  Filled 2018-07-16: qty 26.3

## 2018-07-16 MED ORDER — SODIUM CHLORIDE 0.9% FLUSH
10.0000 mL | INTRAVENOUS | Status: DC | PRN
  Administered 2018-07-16: 10 mL via INTRAVENOUS
  Filled 2018-07-16: qty 10

## 2018-07-16 MED ORDER — PROCHLORPERAZINE MALEATE 10 MG PO TABS
10.0000 mg | ORAL_TABLET | Freq: Once | ORAL | Status: AC
  Administered 2018-07-16: 11:00:00 10 mg via ORAL
  Filled 2018-07-16: qty 1

## 2018-07-16 MED ORDER — HEPARIN SOD (PORK) LOCK FLUSH 100 UNIT/ML IV SOLN
500.0000 [IU] | Freq: Once | INTRAVENOUS | Status: AC
  Administered 2018-07-16: 500 [IU] via INTRAVENOUS
  Filled 2018-07-16: qty 5

## 2018-07-16 NOTE — Progress Notes (Signed)
[place]  [phone] [phone]   Name: Amanda [name] Date: 07/16/2018 MRN: [id number]  DOB: Jun 05, 1950  Patient Care Team: Sofie Hartigan, MD as PCP - General (Family Medicine) Dahlia Byes, Marjory Lies, MD as Consulting Physician (General Surgery)    REASON FOR CONSULTATION: Palliative Care consult requested for this 68 y.o. 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  She was found to have disease progression on Ibrance and letrozole.  Patient previously had verbalized a desire not to pursue further treatment in the event of disease progression.  However, she is decided to start [place].  Palliative care was consulted to help address goals and follow for support.  SOCIAL HISTORY:    Patient lives at home with her husband.  She has a son and stepson who are involved.  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  Her husband owns a "ozone" business.  ADVANCE DIRECTIVES:  Not on file. Has HCPOA and living will at home.   CODE STATUS: DNR  PAST MEDICAL HISTORY: Past Medical History:  Diagnosis Date  . Anxiety   . Back pain    occasionally  . Breast cancer ([place]) 2009   left  . Depression    takes Paxil and Wellbutrin daily  . Diabetes ([place])    takes Metformin daily   type 2   . Genetic testing 02/03/2017   Multi-Cancer panel (83 genes) @ [facility] - No pathogenic mutations detected  . GERD (gastroesophageal reflux disease)    occasional  . History of bronchitis 2 yrs ago  . History of kidney stones   . Hyperlipidemia    takes Atorvastatin daily  . Hypertension    no meds  . Insomnia    takes Ambien nightly  . Insomnia    takes gabapentin nightly  . Joint pain   . Mood swings   . Osteoarthritis of knee   . Personal history of chemotherapy 12/05/2016   Mets from Breast Cancer  . Personal history of radiation therapy 11/2016  .  Pneumonia   . PONV (postoperative nausea and vomiting)   . Seasonal allergies    takes Allegra daily    PAST SURGICAL HISTORY:  Past Surgical History:  Procedure Laterality Date  . BREAST BIOPSY  2009  . cataract surgery Bilateral   . COLONOSCOPY    . IR RADIOLOGIST EVAL & MGMT  01/29/2018  . JOINT REPLACEMENT Left 2014   knee  . KNEE ARTHROSCOPY Left    x 5  . MASTECTOMY Left   . port a cath placed    . PORTACATH PLACEMENT N/A 09/17/2015   Procedure: INSERTION PORT-A-CATH;  Surgeon: Jules Husbands, MD;  Location: [facility] ORS;  Service: General;  Laterality: N/A;  . RADIOLOGY WITH ANESTHESIA N/A 02/20/2018   Procedure: CT WITH ANESTHESIA MICROWAVE THERMAL ABLATION-LIVER;  Surgeon: Jacqulynn Cadet, MD;  Location: [facility];  Service: Anesthesiology;  Laterality: N/A;  . TOOTH EXTRACTION    . TOTAL SHOULDER ARTHROPLASTY Right 06/03/2015   Procedure: TOTAL SHOULDER ARTHROPLASTY;  Surgeon: Tania Ade, MD;  Location: [place];  Service: Orthopedics;  Laterality: Right;  Right total shoulder arthroplasty  . TOTAL SHOULDER REPLACEMENT Right 06/03/2015  . TUBAL LIGATION      HEMATOLOGY/ONCOLOGY HISTORY:  Oncology History  Cancer of left female breast  ([place])  07/27/2014 Initial Diagnosis   Cancer of left female breast [facility])   Metastatic  breast cancer ([place])  09/24/2015 Initial Diagnosis   Metastatic breast cancer ([place])   02/27/2018 - 05/21/2018 Chemotherapy   The patient had pegfilgrastim (NEULASTA) injection 6 mg, 6 mg, Subcutaneous, Once, 1 of 1 cycle Administration: 6 mg (04/19/2018) pegfilgrastim (NEULASTA ONPRO KIT) injection 6 mg, 6 mg, Subcutaneous, Once, 1 of 2 cycles Administration: 6 mg (05/01/2018), 6 mg (05/15/2018) eriBULin mesylate (HALAVEN) 2.75 mg in sodium chloride 0.9 % 100 mL chemo infusion, 1.4 mg/m2 = 2.75 mg, Intravenous,  Once, 3 of 4 cycles Dose modification: 1.1 mg/m2 (original dose 1.4 mg/m2, Cycle 1, Reason: Dose not tolerated) Administration: 2.75 mg (02/27/2018), 2.15 mg  (03/20/2018), 2.15 mg (04/04/2018), 2.15 mg (04/17/2018), 2.15 mg (05/01/2018), 2.15 mg (05/15/2018)  for chemotherapy treatment.    06/04/2018 -  Chemotherapy   The patient had gemcitabine (GEMZAR) 1,400 mg in sodium chloride 0.9 % 250 mL chemo infusion, 1,406 mg, Intravenous,  Once, 4 of 4 cycles Administration: 1,400 mg (06/04/2018), 1,400 mg (06/18/2018), 1,400 mg (07/02/2018)  for chemotherapy treatment.      ALLERGIES:  is allergic to ace inhibitors; latex; morphine and related; and penicillins.  MEDICATIONS:  Current Outpatient Medications  Medication Sig Dispense Refill  . acetaminophen (TYLENOL) 500 MG tablet Take 500 mg by mouth every 6 (six) hours as needed for mild pain or moderate pain.     Marland Kitchen atorvastatin (LIPITOR) 10 MG tablet Take 10 mg by mouth at bedtime.     Marland Kitchen buPROPion (WELLBUTRIN XL) 150 MG 24 hr tablet Take 150 mg by mouth at bedtime.     . Calcium-Magnesium-Vitamin D (CALCIUM 1200+D3 PO) Take 1 tablet by mouth daily.     . Cyanocobalamin 5000 MCG CAPS Take 5,000 mcg by mouth daily.     . fexofenadine (ALLEGRA) 180 MG tablet Take 180 mg by mouth at bedtime.     . gabapentin (NEURONTIN) 300 MG capsule Take 300-600 mg by mouth 2 (two) times daily. Take 300 mg by mouth in the morning and '300mg'$  at night    . levofloxacin (LEVAQUIN) 500 MG tablet Take 1 tablet (500 mg total) by mouth daily. 14 tablet 0  . lidocaine-prilocaine (EMLA) cream Apply 1 application topically as needed. Apply to port 1-2 hours prior to chemotherapy appointment. Cover with plastic wrap. 30 g 0  . metFORMIN (GLUCOPHAGE) 850 MG tablet Take 850 mg by mouth 2 (two) times daily with a meal.     . Oxycodone HCl 10 MG TABS Take 1 tablet (10 mg total) by mouth every 4 (four) hours as needed. Ok to take every 4-6 hours as needed. 90 tablet 0  . pantoprazole (PROTONIX) 40 MG tablet Take 40 mg by mouth at bedtime.     Marland Kitchen PARoxetine (PAXIL) 40 MG tablet Take 40 mg by mouth at bedtime.     . prochlorperazine (COMPAZINE) 10  MG tablet TAKE 1 TABLET BY MOUTH EVERY 6 HOURS AS NEEDED FOR  NAUSEA OR  VOMITING 120 tablet 1  . sodium chloride (MURO 128) 5 % ophthalmic solution Place 1 drop into both eyes 4 (four) times daily.    Marland Kitchen zolpidem (AMBIEN) 10 MG tablet Take 1 tablet (10 mg total) by mouth at bedtime as needed (sleep). 90 tablet 0   No current facility-administered medications for this visit.    Facility-Administered Medications Ordered in Other Visits  Medication Dose Route Frequency Provider Last Rate Last Dose  . sodium chloride flush (NS) 0.9 % injection 10 mL  10 mL Intravenous PRN Lloyd Huger, MD  10 mL at 07/16/18 0915    VITAL SIGNS: There were no vitals taken for this visit. There were no vitals filed for this visit.  Estimated body mass index is 24.37 kg/m as calculated from the following:   Height as of an earlier encounter on 07/16/18: 5' 4"  (1.626 m).   Weight as of an earlier encounter on 07/16/18: 142 lb (64.4 kg).  LABS: CBC:    Component Value Date/Time   WBC 8.0 07/16/2018 0909   HGB 9.5 (L) 07/16/2018 0909   HGB 13.2 12/20/2011 0927   HCT 32.4 (L) 07/16/2018 0909   HCT 40.6 12/20/2011 0927   PLT 328 07/16/2018 0909   PLT 235 12/20/2011 0927   MCV 96.7 07/16/2018 0909   MCV 96 12/20/2011 0927   NEUTROABS 6.4 07/16/2018 0909   NEUTROABS 1.7 12/20/2011 0927   LYMPHSABS 0.7 07/16/2018 0909   LYMPHSABS 1.4 12/20/2011 0927   MONOABS 0.8 07/16/2018 0909   MONOABS 0.4 12/20/2011 0927   EOSABS 0.0 07/16/2018 0909   EOSABS 0.4 12/20/2011 0927   BASOSABS 0.0 07/16/2018 0909   BASOSABS 0.0 12/20/2011 0927   Comprehensive Metabolic Panel:    Component Value Date/Time   NA 137 07/16/2018 0909   NA 139 12/20/2011 0927   K 4.5 07/16/2018 0909   K 4.1 12/20/2011 0927   CL 106 07/16/2018 0909   CL 102 12/20/2011 0927   CO2 24 07/16/2018 0909   CO2 26 12/20/2011 0927   BUN 29 (H) 07/16/2018 0909   BUN 19 (H) 12/20/2011 0927   CREATININE 0.87 07/16/2018 0909   CREATININE  1.15 12/20/2011 0927   GLUCOSE 219 (H) 07/16/2018 0909   GLUCOSE 251 (H) 12/20/2011 0927   CALCIUM 8.2 (L) 07/16/2018 0909   CALCIUM 8.6 12/20/2011 0927   AST 66 (H) 07/16/2018 0909   AST 49 (H) 12/20/2011 0927   ALT 49 (H) 07/16/2018 0909   ALT 57 12/20/2011 0927   ALKPHOS 115 07/16/2018 0909   ALKPHOS 61 12/20/2011 0927   BILITOT 0.7 07/16/2018 0909   BILITOT 0.4 12/20/2011 0927   PROT 5.9 (L) 07/16/2018 0909   PROT 6.8 12/20/2011 0927   ALBUMIN 3.2 (L) 07/16/2018 0909   ALBUMIN 3.6 12/20/2011 0927    RADIOGRAPHIC STUDIES: No results found.  PERFORMANCE STATUS (ECOG) : 1 - Symptomatic but completely ambulatory  Review of Systems As noted above. Otherwise, a complete review of systems is negative.  Physical Exam General: NAD, frail appearing Pulmonary: unlabored, CTA ant fields Cardio: RRR Extremities: trace edema Skin: no rashes Neurological: Weakness but otherwise nonfocal  IMPRESSION: Follow-up visit today.  Patient was seen in the infusion area.  Patient appears to be clinically declining.  She is visibly more frail and weak than when last seen.  Case discussed with Dr. Grayland Ormond.  Plan is to obtain repeat imaging and see her in 2 weeks.  In the event that imaging shows disease progression, patient will be transitioned to hospice.  As of now, patient verbalized a desire to continue treatment as long as clinically feasible.  DME previously ordered.  PLAN: -Continue current scope of treatment -Continue oxycodone prn for pain -RTC in 2 weeks   Patient expressed understanding and was in agreement with this plan. She also understands that She can call clinic at any time with any questions, concerns, or complaints.    Time Total: 15 minutes  Visit consisted of counseling and education dealing with the complex and emotionally intense issues of symptom management and palliative care  in the setting of serious and potentially life-threatening illness.Greater than 50%  of  this time was spent counseling and coordinating care related to the above assessment and plan.  Signed by: Altha Harm, PhD, NP-C [phone] (Work Cell)

## 2018-07-16 NOTE — Progress Notes (Signed)
Patient stated that she had been doing well. 

## 2018-07-17 DIAGNOSIS — C7951 Secondary malignant neoplasm of bone: Secondary | ICD-10-CM | POA: Diagnosis not present

## 2018-07-17 DIAGNOSIS — D630 Anemia in neoplastic disease: Secondary | ICD-10-CM | POA: Diagnosis not present

## 2018-07-17 DIAGNOSIS — C787 Secondary malignant neoplasm of liver and intrahepatic bile duct: Secondary | ICD-10-CM | POA: Diagnosis not present

## 2018-07-17 DIAGNOSIS — E1142 Type 2 diabetes mellitus with diabetic polyneuropathy: Secondary | ICD-10-CM | POA: Diagnosis not present

## 2018-07-17 DIAGNOSIS — C50912 Malignant neoplasm of unspecified site of left female breast: Secondary | ICD-10-CM | POA: Diagnosis not present

## 2018-07-17 DIAGNOSIS — R1310 Dysphagia, unspecified: Secondary | ICD-10-CM | POA: Diagnosis not present

## 2018-07-18 DIAGNOSIS — C787 Secondary malignant neoplasm of liver and intrahepatic bile duct: Secondary | ICD-10-CM | POA: Diagnosis not present

## 2018-07-18 DIAGNOSIS — R1310 Dysphagia, unspecified: Secondary | ICD-10-CM | POA: Diagnosis not present

## 2018-07-18 DIAGNOSIS — E1142 Type 2 diabetes mellitus with diabetic polyneuropathy: Secondary | ICD-10-CM | POA: Diagnosis not present

## 2018-07-18 DIAGNOSIS — D630 Anemia in neoplastic disease: Secondary | ICD-10-CM | POA: Diagnosis not present

## 2018-07-18 DIAGNOSIS — C50912 Malignant neoplasm of unspecified site of left female breast: Secondary | ICD-10-CM | POA: Diagnosis not present

## 2018-07-18 DIAGNOSIS — C7951 Secondary malignant neoplasm of bone: Secondary | ICD-10-CM | POA: Diagnosis not present

## 2018-07-19 DIAGNOSIS — E1142 Type 2 diabetes mellitus with diabetic polyneuropathy: Secondary | ICD-10-CM | POA: Diagnosis not present

## 2018-07-19 DIAGNOSIS — C7951 Secondary malignant neoplasm of bone: Secondary | ICD-10-CM | POA: Diagnosis not present

## 2018-07-19 DIAGNOSIS — D630 Anemia in neoplastic disease: Secondary | ICD-10-CM | POA: Diagnosis not present

## 2018-07-19 DIAGNOSIS — R1310 Dysphagia, unspecified: Secondary | ICD-10-CM | POA: Diagnosis not present

## 2018-07-19 DIAGNOSIS — C50912 Malignant neoplasm of unspecified site of left female breast: Secondary | ICD-10-CM | POA: Diagnosis not present

## 2018-07-19 DIAGNOSIS — C787 Secondary malignant neoplasm of liver and intrahepatic bile duct: Secondary | ICD-10-CM | POA: Diagnosis not present

## 2018-07-22 ENCOUNTER — Other Ambulatory Visit: Payer: Self-pay

## 2018-07-22 ENCOUNTER — Other Ambulatory Visit: Payer: Medicare Other | Admitting: Nurse Practitioner

## 2018-07-22 ENCOUNTER — Encounter: Payer: Self-pay | Admitting: Nurse Practitioner

## 2018-07-22 DIAGNOSIS — E1142 Type 2 diabetes mellitus with diabetic polyneuropathy: Secondary | ICD-10-CM | POA: Diagnosis not present

## 2018-07-22 DIAGNOSIS — R531 Weakness: Secondary | ICD-10-CM | POA: Insufficient documentation

## 2018-07-22 DIAGNOSIS — C787 Secondary malignant neoplasm of liver and intrahepatic bile duct: Secondary | ICD-10-CM | POA: Diagnosis not present

## 2018-07-22 DIAGNOSIS — D630 Anemia in neoplastic disease: Secondary | ICD-10-CM | POA: Diagnosis not present

## 2018-07-22 DIAGNOSIS — Z515 Encounter for palliative care: Secondary | ICD-10-CM | POA: Diagnosis not present

## 2018-07-22 DIAGNOSIS — R1310 Dysphagia, unspecified: Secondary | ICD-10-CM | POA: Diagnosis not present

## 2018-07-22 DIAGNOSIS — C7951 Secondary malignant neoplasm of bone: Secondary | ICD-10-CM | POA: Diagnosis not present

## 2018-07-22 DIAGNOSIS — C50912 Malignant neoplasm of unspecified site of left female breast: Secondary | ICD-10-CM | POA: Diagnosis not present

## 2018-07-22 DIAGNOSIS — R630 Anorexia: Secondary | ICD-10-CM | POA: Diagnosis not present

## 2018-07-22 NOTE — Progress Notes (Signed)
[facility] Palliative Care Consult Note Telephone: [phone]  Fax: [phone]  PATIENT NAME: Amanda [name] DOB: 1951/01/14 MRN: [id number]  PRIMARY CARE PROVIDER:   Sofie Hartigan, MD  REFERRING PROVIDER:  Sofie Hartigan, MD [place],  [place] [address]  RESPONSIBLE PARTY:   Mr. Amanda [name] [phone] or [phone]  I was asked to see Ms. Amanda [name] for Amanda [name] consult for Amanda [name] by Dr Amanda [name]  Due to the COVID-19 crisis, this visit was done via telemedicine from my office and it was initiated and consent by this patient and or family.  RECOMMENDATIONS and PLAN:  1. Palliative care encounter Z51.5; Palliative medicine team will continue to support patient, patient's family, and medical team. Visit consisted of counseling and education dealing with the complex and emotionally intense issues of symptom management and palliative care in the setting of serious and potentially life-threatening illness  2.  Anorexia R63.0 but appetite remaining declined. Continue to encourage supplements and comfort feedings. Discuss nutrition  3. Generalized weakness R53.1/secondary to disease progression; chemotherapy; non-ambulatory, . Encourage energy conservation and rest times.  ASSESSMENT:     I called Mr. Amanda [name] for telemedicine visit. We talked about purpose for palliative care visit in mystery shopper in agreement. We talked about past medical history. We talked about chronic disease progression in the setting of metastatic. We talked about Ms. Amanda [name] residing at home with Mr. Amanda [name] We talked about Life review. Talked about family Dynamics. We talked about her functional level where currently she's very weak and fatigued. She is a lift to the bedside commode. She is now transition to adult diapers. Mr. Amanda [name] endorses is very hard for her. We talked about turn right onto therapy in current plan of care. Mr Amanda [name] endorses that they will continue chemotherapy  for now. He talked about his mom being under Hospice Services and passing at home. We talked about Hospice Services at length. We talked about role of palliative care and plan of care. We talked about medical goals of care aggressive versus comfort care. We talked about at present time expectations for treatment plan. Mr Amanda [name] endorses he doesn't realize they are looking at end of life at present time although they are taking it day-by-day. Their hopes are to continue to get more time. We talked about suffering. We talked about symptoms including appetite which is very poor. Mr. Amanda [name] endorses she only eats a few bites at a meal and he has to puree that in a bullet blender. We talked about different medications that they've used to stimulate her appetite which have been ineffective. He talked about stopping the Gabapentin but continue and CBD ointment to her feet which seemed to help considerably. We talked about coping strategies and quality of life. We talked about role of palliative care and plan of care. Discuss that will follow up and two weeks if needed or sooner should she declined. She continues to go to the cancer center every Tuesday with plan for possible transfusion tomorrow and then the following Tuesday another infusion. Discuss will follow up in 2 weeks if needed or sooner should she declined. Mr Amanda [name] in agreement. Questions answered to satisfaction. Contact information provided.  6 / 30 / 2020 weight 142  She does have a most form completed that shares DNR, limited additional interventions, antibiotics if necessary, IV fluids if necessary, no feeding tube  6 / 30 / 2,020 sodium 137, potassium 4.5, 106, calcium 8.2, bun 29,  Creatinine 0.87, glucose 219, albumin 3.2, total protein 5.9, WBC 8.0, hemoglobin 9.5, hematocrit 32.4, platelets 328  Echo 2 / 17 / 2020 left ventricle mildly reduced systolic function with ef 45 to 50% and right ventricular systolic pressure could not be  assessed.  I spent 60 minutes providing this consultation,  From 11:30am  to 12: 45pm. More than 50% of the time in this consultation was spent coordinating communication.   HISTORY OF PRESENT ILLNESS:  Amanda [name] is a 68 y.o. 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 Tubal ligation, right shoulder replacement, port-a-cath placement, cataract surgery bilaterally. She was last seen by Dr. 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 CT scan from 5 / 4 / 2020 reported continued progression of disease in liver. MRI of brain 5 / 18 / 2020 did not reveal metastatic disease. MRI of back 4 / 19 / 2019 did not reveal metastatic disease. She came to the clinic for next infusion of gemcitabine. She continues to have declining performance with significant fatigue and weakness. She continues with home health and home physical therapy. Chronic peripheral neuropathy which slightly improved. Her documentation hospice with end-of-life care discussed multiple occasions but Ms. Lasala declined and wishes to continue chemotherapy. No additional option after trying single-agent gemcitabine with persistent pancytopenia. She proceeded with cycle for and to return in clinic may need to consider blood transfusion and then two week for consideration of cycle 5. Appetite has been poor and she tried Remeron, dexamethasone without much effect. She did try Megace but discontinued after 2 days. She did not wish to retry. She does take Ambien for insomnia which has improved. She has completed a 14-day course of Levaquin for bilateral lung opacities. Referral was sent to Speech Pathology for further evaluation with  significant symptoms of aspirating with eating and a recent normal barium swallow study. She has been having more difficulty with memory, continue to have Progressive and persistent osteitis metastases disease though MRI of brain on 5/18 / 2020 with no obvious intracranial abnormality. Ms. Dettmann does reside at home with her husband. Palliative Care was asked to help address goals of care.   CODE STATUS: DNR  PPS: 40% HOSPICE ELIGIBILITY/DIAGNOSIS: TBD  PAST MEDICAL HISTORY:  Past Medical History:  Diagnosis Date   Anxiety    Back pain    occasionally   Breast cancer ([place]) 2009   left   Depression    takes Paxil and Wellbutrin daily   Diabetes ([place])    takes Metformin daily   type 2    Genetic testing 02/03/2017   Multi-Cancer panel (83 genes) @ [facility] - No pathogenic mutations detected   GERD (gastroesophageal reflux disease)    occasional   History of bronchitis 2 yrs ago   History of kidney stones    Hyperlipidemia    takes Atorvastatin daily   Hypertension    no meds   Insomnia    takes Ambien nightly   Insomnia    takes gabapentin nightly   Joint pain    Mood swings    Osteoarthritis of knee    Personal history of chemotherapy 12/05/2016   Mets from Breast Cancer   Personal history of radiation therapy 11/2016   Pneumonia  PONV (postoperative nausea and vomiting)    Seasonal allergies    takes Allegra daily    SOCIAL HX:  Social History   Tobacco Use   Smoking status: Never Smoker   Smokeless tobacco: Never Used  Substance Use Topics   Alcohol use: Yes    Alcohol/week: 0.0 standard drinks    Comment: occasionally wine    ALLERGIES:  Allergies  Allergen Reactions   Ace Inhibitors Cough   Latex Itching   Morphine And Related Itching    Caused her to itch terribly. Would prefer if given to take with a benadryl   Penicillins Rash    Has patient had a PCN reaction causing immediate rash, facial/tongue/throat swelling, SOB  or lightheadedness with hypotension: No Has patient had a PCN reaction causing severe rash involving mucus membranes or skin necrosis: No Has patient had a PCN reaction that required hospitalization No Has patient had a PCN reaction occurring within the last 10 years: No If all of the above answers are "NO", then may proceed with Cephalosporin use.     PERTINENT MEDICATIONS:  Outpatient Encounter Medications as of 07/22/2018  Medication Sig   acetaminophen (TYLENOL) 500 MG tablet Take 500 mg by mouth every 6 (six) hours as needed for mild pain or moderate pain.    atorvastatin (LIPITOR) 10 MG tablet Take 10 mg by mouth at bedtime.    buPROPion (WELLBUTRIN XL) 150 MG 24 hr tablet Take 150 mg by mouth at bedtime.    Calcium-Magnesium-Vitamin D (CALCIUM 1200+D3 PO) Take 1 tablet by mouth daily.    Cyanocobalamin 5000 MCG CAPS Take 5,000 mcg by mouth daily.    fexofenadine (ALLEGRA) 180 MG tablet Take 180 mg by mouth at bedtime.    gabapentin (NEURONTIN) 300 MG capsule Take 300-600 mg by mouth 2 (two) times daily. Take 300 mg by mouth in the morning and 37m at night   levofloxacin (LEVAQUIN) 500 MG tablet Take 1 tablet (500 mg total) by mouth daily.   lidocaine-prilocaine (EMLA) cream Apply 1 application topically as needed. Apply to port 1-2 hours prior to chemotherapy appointment. Cover with plastic wrap.   metFORMIN (GLUCOPHAGE) 850 MG tablet Take 850 mg by mouth 2 (two) times daily with a meal.    Oxycodone HCl 10 MG TABS Take 1 tablet (10 mg total) by mouth every 4 (four) hours as needed. Ok to take every 4-6 hours as needed.   pantoprazole (PROTONIX) 40 MG tablet Take 40 mg by mouth at bedtime.    PARoxetine (PAXIL) 40 MG tablet Take 40 mg by mouth at bedtime.    prochlorperazine (COMPAZINE) 10 MG tablet TAKE 1 TABLET BY MOUTH EVERY 6 HOURS AS NEEDED FOR  NAUSEA OR  VOMITING   sodium chloride (MURO 128) 5 % ophthalmic solution Place 1 drop into both eyes 4 (four) times daily.    zolpidem (AMBIEN) 10 MG tablet Take 1 tablet (10 mg total) by mouth at bedtime as needed (sleep).   No facility-administered encounter medications on file as of 07/22/2018.     PHYSICAL EXAM:  Deferred  Christin Z Gusler, NP

## 2018-07-23 ENCOUNTER — Inpatient Hospital Stay: Payer: Medicare Other | Attending: Oncology

## 2018-07-23 ENCOUNTER — Other Ambulatory Visit: Payer: Self-pay

## 2018-07-23 ENCOUNTER — Telehealth: Payer: Self-pay | Admitting: *Deleted

## 2018-07-23 ENCOUNTER — Inpatient Hospital Stay: Payer: Medicare Other

## 2018-07-23 DIAGNOSIS — K5900 Constipation, unspecified: Secondary | ICD-10-CM | POA: Diagnosis not present

## 2018-07-23 DIAGNOSIS — C50919 Malignant neoplasm of unspecified site of unspecified female breast: Secondary | ICD-10-CM | POA: Diagnosis not present

## 2018-07-23 DIAGNOSIS — D649 Anemia, unspecified: Secondary | ICD-10-CM | POA: Diagnosis not present

## 2018-07-23 DIAGNOSIS — G4700 Insomnia, unspecified: Secondary | ICD-10-CM | POA: Insufficient documentation

## 2018-07-23 DIAGNOSIS — D61818 Other pancytopenia: Secondary | ICD-10-CM | POA: Diagnosis not present

## 2018-07-23 DIAGNOSIS — Z515 Encounter for palliative care: Secondary | ICD-10-CM | POA: Insufficient documentation

## 2018-07-23 DIAGNOSIS — C7951 Secondary malignant neoplasm of bone: Secondary | ICD-10-CM | POA: Diagnosis not present

## 2018-07-23 DIAGNOSIS — C787 Secondary malignant neoplasm of liver and intrahepatic bile duct: Secondary | ICD-10-CM | POA: Diagnosis not present

## 2018-07-23 DIAGNOSIS — R748 Abnormal levels of other serum enzymes: Secondary | ICD-10-CM | POA: Insufficient documentation

## 2018-07-23 DIAGNOSIS — Z95828 Presence of other vascular implants and grafts: Secondary | ICD-10-CM

## 2018-07-23 LAB — CBC WITH DIFFERENTIAL/PLATELET
Abs Immature Granulocytes: 0.25 10*3/uL — ABNORMAL HIGH (ref 0.00–0.07)
Basophils Absolute: 0 10*3/uL (ref 0.0–0.1)
Basophils Relative: 0 %
Eosinophils Absolute: 0 10*3/uL (ref 0.0–0.5)
Eosinophils Relative: 0 %
HCT: 29.7 % — ABNORMAL LOW (ref 36.0–46.0)
Hemoglobin: 8.9 g/dL — ABNORMAL LOW (ref 12.0–15.0)
Immature Granulocytes: 5 %
Lymphocytes Relative: 12 %
Lymphs Abs: 0.6 10*3/uL — ABNORMAL LOW (ref 0.7–4.0)
MCH: 28.2 pg (ref 26.0–34.0)
MCHC: 30 g/dL (ref 30.0–36.0)
MCV: 94 fL (ref 80.0–100.0)
Monocytes Absolute: 0.9 10*3/uL (ref 0.1–1.0)
Monocytes Relative: 18 %
Neutro Abs: 3.4 10*3/uL (ref 1.7–7.7)
Neutrophils Relative %: 65 %
Platelets: 252 10*3/uL (ref 150–400)
RBC: 3.16 MIL/uL — ABNORMAL LOW (ref 3.87–5.11)
RDW: 17.5 % — ABNORMAL HIGH (ref 11.5–15.5)
WBC: 5.2 10*3/uL (ref 4.0–10.5)
nRBC: 0.6 % — ABNORMAL HIGH (ref 0.0–0.2)

## 2018-07-23 LAB — COMPREHENSIVE METABOLIC PANEL
ALT: 58 U/L — ABNORMAL HIGH (ref 0–44)
AST: 80 U/L — ABNORMAL HIGH (ref 15–41)
Albumin: 3.1 g/dL — ABNORMAL LOW (ref 3.5–5.0)
Alkaline Phosphatase: 148 U/L — ABNORMAL HIGH (ref 38–126)
Anion gap: 10 (ref 5–15)
BUN: 24 mg/dL — ABNORMAL HIGH (ref 8–23)
CO2: 24 mmol/L (ref 22–32)
Calcium: 8.4 mg/dL — ABNORMAL LOW (ref 8.9–10.3)
Chloride: 104 mmol/L (ref 98–111)
Creatinine, Ser: 0.77 mg/dL (ref 0.44–1.00)
GFR calc Af Amer: >60 mL/min (ref >60)
GFR calc non Af Amer: >60 mL/min (ref >60)
Glucose, Bld: 209 mg/dL — ABNORMAL HIGH (ref 70–99)
Potassium: 4.8 mmol/L (ref 3.5–5.1)
Sodium: 138 mmol/L (ref 135–145)
Total Bilirubin: 0.6 mg/dL (ref 0.3–1.2)
Total Protein: 5.8 g/dL — ABNORMAL LOW (ref 6.5–8.1)

## 2018-07-23 MED ORDER — HEPARIN SOD (PORK) LOCK FLUSH 100 UNIT/ML IV SOLN
500.0000 [IU] | Freq: Once | INTRAVENOUS | Status: AC
  Administered 2018-07-23: 500 [IU] via INTRAVENOUS

## 2018-07-23 MED ORDER — SODIUM CHLORIDE 0.9% FLUSH
10.0000 mL | Freq: Once | INTRAVENOUS | Status: AC
  Administered 2018-07-23: 10 mL via INTRAVENOUS
  Filled 2018-07-23: qty 10

## 2018-07-23 MED ORDER — HEPARIN SOD (PORK) LOCK FLUSH 100 UNIT/ML IV SOLN
INTRAVENOUS | Status: AC
  Filled 2018-07-23: qty 5

## 2018-07-23 NOTE — Progress Notes (Signed)
Hbg 8.9 today. No blood transfusion. Patient deaccessed, discharged home.

## 2018-07-23 NOTE — Telephone Encounter (Signed)
Call from [place] with [facility] asking for orders to treat patient once a week times 6 weeks for her swallowing problems

## 2018-07-23 NOTE — Telephone Encounter (Signed)
Called Tom back and had to leave him a voicemail.

## 2018-07-24 ENCOUNTER — Telehealth: Payer: Self-pay

## 2018-07-24 DIAGNOSIS — C7951 Secondary malignant neoplasm of bone: Secondary | ICD-10-CM | POA: Diagnosis not present

## 2018-07-24 DIAGNOSIS — D630 Anemia in neoplastic disease: Secondary | ICD-10-CM | POA: Diagnosis not present

## 2018-07-24 DIAGNOSIS — R1310 Dysphagia, unspecified: Secondary | ICD-10-CM | POA: Diagnosis not present

## 2018-07-24 DIAGNOSIS — C787 Secondary malignant neoplasm of liver and intrahepatic bile duct: Secondary | ICD-10-CM | POA: Diagnosis not present

## 2018-07-24 DIAGNOSIS — C50912 Malignant neoplasm of unspecified site of left female breast: Secondary | ICD-10-CM | POA: Diagnosis not present

## 2018-07-24 DIAGNOSIS — E1142 Type 2 diabetes mellitus with diabetic polyneuropathy: Secondary | ICD-10-CM | POA: Diagnosis not present

## 2018-07-24 LAB — SAMPLE TO BLOOD BANK

## 2018-07-24 NOTE — Telephone Encounter (Signed)
Called Tom again and left him a verbal order but I also told him to call me back if he needed me to fax an order.

## 2018-07-25 DIAGNOSIS — D630 Anemia in neoplastic disease: Secondary | ICD-10-CM | POA: Diagnosis not present

## 2018-07-25 DIAGNOSIS — C50912 Malignant neoplasm of unspecified site of left female breast: Secondary | ICD-10-CM | POA: Diagnosis not present

## 2018-07-25 DIAGNOSIS — C7951 Secondary malignant neoplasm of bone: Secondary | ICD-10-CM | POA: Diagnosis not present

## 2018-07-25 DIAGNOSIS — E1142 Type 2 diabetes mellitus with diabetic polyneuropathy: Secondary | ICD-10-CM | POA: Diagnosis not present

## 2018-07-25 DIAGNOSIS — R1310 Dysphagia, unspecified: Secondary | ICD-10-CM | POA: Diagnosis not present

## 2018-07-25 DIAGNOSIS — C787 Secondary malignant neoplasm of liver and intrahepatic bile duct: Secondary | ICD-10-CM | POA: Diagnosis not present

## 2018-07-26 ENCOUNTER — Other Ambulatory Visit: Payer: Self-pay | Admitting: Oncology

## 2018-07-26 ENCOUNTER — Ambulatory Visit: Admission: RE | Admit: 2018-07-26 | Payer: Medicare Other | Source: Ambulatory Visit

## 2018-07-26 DIAGNOSIS — D630 Anemia in neoplastic disease: Secondary | ICD-10-CM | POA: Diagnosis not present

## 2018-07-26 DIAGNOSIS — R1310 Dysphagia, unspecified: Secondary | ICD-10-CM | POA: Diagnosis not present

## 2018-07-26 DIAGNOSIS — C50912 Malignant neoplasm of unspecified site of left female breast: Secondary | ICD-10-CM | POA: Diagnosis not present

## 2018-07-26 DIAGNOSIS — E1142 Type 2 diabetes mellitus with diabetic polyneuropathy: Secondary | ICD-10-CM | POA: Diagnosis not present

## 2018-07-26 DIAGNOSIS — C787 Secondary malignant neoplasm of liver and intrahepatic bile duct: Secondary | ICD-10-CM | POA: Diagnosis not present

## 2018-07-26 DIAGNOSIS — C7951 Secondary malignant neoplasm of bone: Secondary | ICD-10-CM | POA: Diagnosis not present

## 2018-07-28 NOTE — Progress Notes (Signed)
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  INTERVAL HISTORY: Patient returns to clinic today for further evaluation and consideration of her next infusion of gemcitabine.  She did not have her CT scans done several days ago.  Patient performance status continues to decline and she has significant weakness and fatigue.  She has chronic peripheral neuropathy which is unchanged.  She has no other neurologic complaints.  She denies any recent fevers or illnesses.  She denies any chest pain, shortness of breath or hemoptysis.  She continues to have occasional cough, particularly with eating.  She denies any nausea, vomiting, constipation, or diarrhea. She has no urinary complaints.  Patient feels generally terrible, but offers no further specific complaints today.  REVIEW OF SYSTEMS:   Review of Systems  Constitutional: Positive for malaise/fatigue and weight loss. Negative for fever.  Eyes: Negative.  Negative for blurred vision, double vision and pain.  Respiratory: Positive for cough. Negative for shortness of breath.   Cardiovascular: Negative.  Negative for chest pain and leg swelling.  Gastrointestinal: Negative.  Negative for abdominal pain.  Genitourinary: Negative.  Negative for dysuria and flank pain.  Musculoskeletal: Negative for back pain and falls.  Skin: Negative.  Negative for rash.  Neurological: Positive for tingling, sensory change and weakness. Negative for focal weakness and headaches.  Endo/Heme/Allergies: Negative.   Psychiatric/Behavioral: Positive for memory loss. The patient is not nervous/anxious and does not have insomnia.      As per HPI. Otherwise, a complete review of systems is negative.  PAST MEDICAL HISTORY: Past Medical History:  Diagnosis Date  . Anxiety   . Back pain    occasionally  . Breast cancer ([facility]) 2009   left  . Depression    takes Paxil and Wellbutrin daily  . Diabetes ([place])    takes Metformin daily   type 2   . Genetic testing 02/03/2017   Multi-Cancer panel (83 genes) @ [facility] - No pathogenic mutations detected  . GERD (gastroesophageal reflux disease)    occasional  . History of bronchitis 2 yrs ago  . History of kidney stones   . Hyperlipidemia    takes Atorvastatin daily  . Hypertension    no meds  . Insomnia    takes Ambien nightly  . Insomnia    takes gabapentin nightly  . Joint pain   . Mood swings   . Osteoarthritis of knee   . Personal history of chemotherapy 12/05/2016   Mets from Breast Cancer  . Personal history of radiation therapy 11/2016  . Pneumonia   . PONV (postoperative nausea and vomiting)   . Seasonal allergies    takes Allegra daily    PAST SURGICAL HISTORY: Past Surgical History:  Procedure Laterality Date  . BREAST BIOPSY  2009  . cataract surgery Bilateral   . COLONOSCOPY    . IR RADIOLOGIST EVAL & MGMT  01/29/2018  . JOINT REPLACEMENT Left 2014   knee  . KNEE ARTHROSCOPY Left    x 5  . MASTECTOMY Left   . port a cath placed    . PORTACATH PLACEMENT N/A 09/17/2015   Procedure: INSERTION PORT-A-CATH;  Surgeon: Jules Husbands, MD;  Location: [facility] ORS;  Service: General;  Laterality: N/A;  . RADIOLOGY WITH ANESTHESIA N/A 02/20/2018   Procedure: CT WITH ANESTHESIA MICROWAVE THERMAL ABLATION-LIVER;  Surgeon: Jacqulynn Cadet, MD;  Location: [facility];  Service: Anesthesiology;  Laterality: N/A;  . TOOTH EXTRACTION    . TOTAL SHOULDER ARTHROPLASTY Right 06/03/2015   Procedure: TOTAL SHOULDER ARTHROPLASTY;  Surgeon: Tania Ade, MD;  Location: [facility];  Service: Orthopedics;  Laterality: Right;  Right total shoulder arthroplasty  . TOTAL SHOULDER  REPLACEMENT Right 06/03/2015  . TUBAL LIGATION      FAMILY HISTORY: Father with non-Hodgkin's lymphoma, 2 paternal aunts with breast cancer.     ADVANCED DIRECTIVES:    HEALTH MAINTENANCE: Social History   Tobacco Use  . Smoking status: Never Smoker  . Smokeless tobacco: Never Used  Substance Use Topics  . Alcohol use: Yes    Alcohol/week: 0.0 standard drinks    Comment: occasionally wine  . Drug use: Yes    Types: Marijuana    Comment: cannabis with no extra  low dose edibles  for neuropathy     Colonoscopy:  PAP:  Bone density:  Lipid panel:  Allergies  Allergen Reactions  . Ace Inhibitors Cough  . Latex Itching  . Morphine And Related Itching    Caused her to itch terribly. Would prefer if given to take with a benadryl  . Penicillins Rash    Has patient had a PCN reaction causing immediate rash, facial/tongue/throat swelling, SOB or lightheadedness with hypotension: No Has patient had a PCN reaction causing severe rash involving mucus membranes or skin necrosis: No Has patient had a PCN reaction that required hospitalization No Has patient had a PCN reaction occurring within the last 10 years: No If all of the above answers are "NO", then may proceed with Cephalosporin use.    Current Outpatient Medications  Medication Sig Dispense Refill  . acetaminophen (TYLENOL) 500 MG tablet Take 500 mg by mouth every 6 (six) hours as needed for mild pain or moderate pain.     Marland Kitchen atorvastatin (LIPITOR) 10 MG tablet Take 10 mg by mouth at bedtime.     Marland Kitchen buPROPion (WELLBUTRIN XL) 150 MG 24 hr tablet Take 150 mg by mouth at bedtime.     . Calcium-Magnesium-Vitamin D (CALCIUM 1200+D3 PO) Take 1 tablet by mouth daily.     . Cyanocobalamin 5000 MCG CAPS Take 5,000 mcg by mouth daily.     . fexofenadine (ALLEGRA) 180 MG tablet Take 180 mg by mouth at bedtime.     . gabapentin (NEURONTIN) 300 MG capsule Take 300-600 mg by mouth 2 (two) times daily. Take 300 mg by mouth in the morning  and 378m at night    . lidocaine-prilocaine (EMLA) cream Apply 1 application topically as needed. Apply to port 1-2 hours prior to chemotherapy appointment. Cover with plastic wrap. 30 g 0  . metFORMIN (GLUCOPHAGE) 850 MG tablet Take 850 mg by mouth 2 (two) times daily with a meal.     . Oxycodone HCl 10 MG TABS Take 1 tablet (10 mg total) by mouth every 4 (four) hours as needed. Ok to take every 4-6 hours as needed. 90 tablet 0  . pantoprazole (PROTONIX) 40 MG tablet Take 40 mg by mouth at bedtime.     .Marland KitchenPARoxetine (PAXIL) 40 MG tablet Take 40 mg by mouth at bedtime.     . prochlorperazine (COMPAZINE) 10 MG tablet TAKE 1 TABLET BY MOUTH EVERY 6 HOURS AS NEEDED FOR  NAUSEA OR  VOMITING 120  tablet 1  . sodium chloride (MURO 128) 5 % ophthalmic solution Place 1 drop into both eyes 4 (four) times daily.    Marland Kitchen zolpidem (AMBIEN) 10 MG tablet Take 1 tablet (10 mg total) by mouth at bedtime as needed (sleep). 90 tablet 0  . levofloxacin (LEVAQUIN) 500 MG tablet Take 1 tablet (500 mg total) by mouth daily. (Patient not taking: Reported on 07/30/2018) 14 tablet 0   No current facility-administered medications for this visit.     OBJECTIVE: Vitals:   07/30/18 0851  BP: 125/79  Pulse: (!) 107  Temp: (!) 95.2 F (35.1 C)     Body mass index is 23.69 kg/m.    ECOG FS:3 - Symptomatic, >50% confined to bed  General: Thin, no acute distress.  Sitting in a wheelchair. Eyes: Pink conjunctiva, anicteric sclera. HEENT: Normocephalic, moist mucous membranes. Lungs: Clear to auscultation bilaterally. Heart: Regular rate and rhythm. No rubs, murmurs, or gallops. Abdomen: Soft, nontender, nondistended. No organomegaly noted, normoactive bowel sounds. Musculoskeletal: No edema, cyanosis, or clubbing. Neuro: Alert, answering all questions appropriately. Cranial nerves grossly intact. Skin: No rashes or petechiae noted. Psych: Normal affect.  LAB RESULTS:  Lab Results  Component Value Date   NA 138  07/30/2018   K 4.8 07/30/2018   CL 105 07/30/2018   CO2 23 07/30/2018   GLUCOSE 212 (H) 07/30/2018   BUN 25 (H) 07/30/2018   CREATININE 0.67 07/30/2018   CALCIUM 8.4 (L) 07/30/2018   PROT 6.2 (L) 07/30/2018   ALBUMIN 3.2 (L) 07/30/2018   AST 56 (H) 07/30/2018   ALT 44 07/30/2018   ALKPHOS 156 (H) 07/30/2018   BILITOT 0.5 07/30/2018   GFRNONAA >60 07/30/2018   GFRAA >60 07/30/2018    Lab Results  Component Value Date   WBC 5.7 07/30/2018   NEUTROABS 3.9 07/30/2018   HGB 9.0 (L) 07/30/2018   HCT 30.7 (L) 07/30/2018   MCV 95.3 07/30/2018   PLT 408 (H) 07/30/2018     STUDIES: No results found.  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  PLAN:    1.  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  MRI of the brain on Jun 03, 2018 did not reveal metastatic disease.  Hospice and end-of-life care have been discussed on multiple occasions, but patient declined and wished to try additional chemotherapy.  She expressed understanding that given her performance status, there is likely no additional options after trying single agent gemcitabine.  Given patient's persistent pancytopenia, she will likely only be able to tolerate treatment every other week.  Delay cycle 5 of gemcitabine today given declining performance status.  Will reschedule CT scans for later this week.  Return to clinic in 1 week for further evaluation and discussion of her imaging results.     2.  Anemia: Hemoglobin decreased, but stable at 9.0.  She does not require transfusion. 3.  Peripheral neuropathy: Chronic and unchanged.   4.  Back pain: MRI results from May 04, 2017 did not reveal metastatic disease.  Continue symptomatic treatment.  5.  Left IJ clot: Ultrasound results reviewed  independently.  Patient has been instructed to discontinue Eliquis. 6.  Neutropenia: Resolved. 7.  Memory complaints/confusion: Repeat MRI of the brain on Jun 03, 2018 reviewed independently with no obvious intracranial abnormality.  Patient continues  to have progressive and persistent osseous metastatic disease. 8.  Elevated liver enzymes: Nearly resolved.  Gemcitabine does not need to be dose reduced in the setting of liver dysfunction. 9.  Apparent aspiration: Despite a recent normal barium swallow, patient still describes symptoms of significant aspiration with eating.  Patient is now being seen by speech pathology with home health.   10.  Poor appetite: Patient has tried Remeron and dexamethasone without much effect.  She only took 2 days of Megace prior to discontinuing.  Patient does not wish to retry Megace at this time.  Appreciate dietary input.   11.  Insomnia: Improved.  Continue Ambien as prescribed.   12.  Bilateral lung opacities: Patient has completed a 14-day course of Levaquin.  Patient expressed understanding and was in agreement with this plan. She also understands that She can call clinic at any time with any questions, concerns, or complaints.   Breast cancer   Staging form: Breast, AJCC 7th Edition     Pathologic stage from 08/11/2014: Stage IIIA (T0, N2a, cM0) - Signed by Lloyd Huger, MD on 08/11/2014   Lloyd Huger, MD   07/31/2018 1:13 PM

## 2018-07-29 DIAGNOSIS — R1310 Dysphagia, unspecified: Secondary | ICD-10-CM | POA: Diagnosis not present

## 2018-07-29 DIAGNOSIS — E1142 Type 2 diabetes mellitus with diabetic polyneuropathy: Secondary | ICD-10-CM | POA: Diagnosis not present

## 2018-07-29 DIAGNOSIS — D630 Anemia in neoplastic disease: Secondary | ICD-10-CM | POA: Diagnosis not present

## 2018-07-29 DIAGNOSIS — C7951 Secondary malignant neoplasm of bone: Secondary | ICD-10-CM | POA: Diagnosis not present

## 2018-07-29 DIAGNOSIS — C787 Secondary malignant neoplasm of liver and intrahepatic bile duct: Secondary | ICD-10-CM | POA: Diagnosis not present

## 2018-07-29 DIAGNOSIS — C50912 Malignant neoplasm of unspecified site of left female breast: Secondary | ICD-10-CM | POA: Diagnosis not present

## 2018-07-30 ENCOUNTER — Inpatient Hospital Stay: Payer: Medicare Other | Admitting: Hospice and Palliative Medicine

## 2018-07-30 ENCOUNTER — Inpatient Hospital Stay (HOSPITAL_BASED_OUTPATIENT_CLINIC_OR_DEPARTMENT_OTHER): Payer: Medicare Other | Admitting: Oncology

## 2018-07-30 ENCOUNTER — Inpatient Hospital Stay: Payer: Medicare Other

## 2018-07-30 ENCOUNTER — Encounter: Payer: Self-pay | Admitting: Oncology

## 2018-07-30 ENCOUNTER — Other Ambulatory Visit: Payer: Self-pay

## 2018-07-30 VITALS — BP 125/79 | HR 107 | Temp 95.2°F | Ht 64.0 in | Wt 138.0 lb

## 2018-07-30 DIAGNOSIS — R110 Nausea: Secondary | ICD-10-CM

## 2018-07-30 DIAGNOSIS — C50919 Malignant neoplasm of unspecified site of unspecified female breast: Secondary | ICD-10-CM

## 2018-07-30 DIAGNOSIS — R531 Weakness: Secondary | ICD-10-CM | POA: Diagnosis not present

## 2018-07-30 DIAGNOSIS — K5900 Constipation, unspecified: Secondary | ICD-10-CM

## 2018-07-30 DIAGNOSIS — C7951 Secondary malignant neoplasm of bone: Secondary | ICD-10-CM

## 2018-07-30 DIAGNOSIS — D649 Anemia, unspecified: Secondary | ICD-10-CM

## 2018-07-30 DIAGNOSIS — Z95828 Presence of other vascular implants and grafts: Secondary | ICD-10-CM

## 2018-07-30 DIAGNOSIS — G620 Drug-induced polyneuropathy: Secondary | ICD-10-CM

## 2018-07-30 DIAGNOSIS — D61818 Other pancytopenia: Secondary | ICD-10-CM | POA: Diagnosis not present

## 2018-07-30 DIAGNOSIS — G4700 Insomnia, unspecified: Secondary | ICD-10-CM

## 2018-07-30 DIAGNOSIS — R05 Cough: Secondary | ICD-10-CM

## 2018-07-30 DIAGNOSIS — R5383 Other fatigue: Secondary | ICD-10-CM | POA: Diagnosis not present

## 2018-07-30 DIAGNOSIS — R748 Abnormal levels of other serum enzymes: Secondary | ICD-10-CM

## 2018-07-30 DIAGNOSIS — C787 Secondary malignant neoplasm of liver and intrahepatic bile duct: Secondary | ICD-10-CM

## 2018-07-30 LAB — COMPREHENSIVE METABOLIC PANEL
ALT: 44 U/L (ref 0–44)
AST: 56 U/L — ABNORMAL HIGH (ref 15–41)
Albumin: 3.2 g/dL — ABNORMAL LOW (ref 3.5–5.0)
Alkaline Phosphatase: 156 U/L — ABNORMAL HIGH (ref 38–126)
Anion gap: 10 (ref 5–15)
BUN: 25 mg/dL — ABNORMAL HIGH (ref 8–23)
CO2: 23 mmol/L (ref 22–32)
Calcium: 8.4 mg/dL — ABNORMAL LOW (ref 8.9–10.3)
Chloride: 105 mmol/L (ref 98–111)
Creatinine, Ser: 0.67 mg/dL (ref 0.44–1.00)
GFR calc Af Amer: >60 mL/min (ref >60)
GFR calc non Af Amer: >60 mL/min (ref >60)
Glucose, Bld: 212 mg/dL — ABNORMAL HIGH (ref 70–99)
Potassium: 4.8 mmol/L (ref 3.5–5.1)
Sodium: 138 mmol/L (ref 135–145)
Total Bilirubin: 0.5 mg/dL (ref 0.3–1.2)
Total Protein: 6.2 g/dL — ABNORMAL LOW (ref 6.5–8.1)

## 2018-07-30 LAB — CBC WITH DIFFERENTIAL/PLATELET
Abs Immature Granulocytes: 0.06 10*3/uL (ref 0.00–0.07)
Basophils Absolute: 0 10*3/uL (ref 0.0–0.1)
Basophils Relative: 0 %
Eosinophils Absolute: 0 10*3/uL (ref 0.0–0.5)
Eosinophils Relative: 0 %
HCT: 30.7 % — ABNORMAL LOW (ref 36.0–46.0)
Hemoglobin: 9 g/dL — ABNORMAL LOW (ref 12.0–15.0)
Immature Granulocytes: 1 %
Lymphocytes Relative: 15 %
Lymphs Abs: 0.9 10*3/uL (ref 0.7–4.0)
MCH: 28 pg (ref 26.0–34.0)
MCHC: 29.3 g/dL — ABNORMAL LOW (ref 30.0–36.0)
MCV: 95.3 fL (ref 80.0–100.0)
Monocytes Absolute: 0.8 10*3/uL (ref 0.1–1.0)
Monocytes Relative: 15 %
Neutro Abs: 3.9 10*3/uL (ref 1.7–7.7)
Neutrophils Relative %: 69 %
Platelets: 408 10*3/uL — ABNORMAL HIGH (ref 150–400)
RBC: 3.22 MIL/uL — ABNORMAL LOW (ref 3.87–5.11)
RDW: 18.2 % — ABNORMAL HIGH (ref 11.5–15.5)
WBC: 5.7 10*3/uL (ref 4.0–10.5)
nRBC: 0 % (ref 0.0–0.2)

## 2018-07-30 MED ORDER — SODIUM CHLORIDE 0.9% FLUSH
10.0000 mL | Freq: Once | INTRAVENOUS | Status: AC
  Administered 2018-07-30: 10 mL via INTRAVENOUS
  Filled 2018-07-30: qty 10

## 2018-07-30 MED ORDER — HEPARIN SOD (PORK) LOCK FLUSH 100 UNIT/ML IV SOLN
500.0000 [IU] | Freq: Once | INTRAVENOUS | Status: AC
  Administered 2018-07-30: 10:00:00 500 [IU] via INTRAVENOUS

## 2018-07-30 MED ORDER — HEPARIN SOD (PORK) LOCK FLUSH 100 UNIT/ML IV SOLN
INTRAVENOUS | Status: AC
  Filled 2018-07-30: qty 5

## 2018-07-30 NOTE — Progress Notes (Signed)
Patient wants to know what could be done for her constipation. Patient also stated that she is feeling "yucky". Patient stated that she had also been nauseated but no vomiting.

## 2018-07-31 ENCOUNTER — Other Ambulatory Visit: Payer: Medicare Other | Admitting: Nurse Practitioner

## 2018-07-31 ENCOUNTER — Encounter: Payer: Self-pay | Admitting: Nurse Practitioner

## 2018-07-31 DIAGNOSIS — R531 Weakness: Secondary | ICD-10-CM

## 2018-07-31 DIAGNOSIS — C50912 Malignant neoplasm of unspecified site of left female breast: Secondary | ICD-10-CM | POA: Diagnosis not present

## 2018-07-31 DIAGNOSIS — C787 Secondary malignant neoplasm of liver and intrahepatic bile duct: Secondary | ICD-10-CM | POA: Diagnosis not present

## 2018-07-31 DIAGNOSIS — D630 Anemia in neoplastic disease: Secondary | ICD-10-CM | POA: Diagnosis not present

## 2018-07-31 DIAGNOSIS — R630 Anorexia: Secondary | ICD-10-CM

## 2018-07-31 DIAGNOSIS — R1310 Dysphagia, unspecified: Secondary | ICD-10-CM | POA: Diagnosis not present

## 2018-07-31 DIAGNOSIS — E1142 Type 2 diabetes mellitus with diabetic polyneuropathy: Secondary | ICD-10-CM | POA: Diagnosis not present

## 2018-07-31 DIAGNOSIS — Z515 Encounter for palliative care: Secondary | ICD-10-CM | POA: Diagnosis not present

## 2018-07-31 DIAGNOSIS — C7951 Secondary malignant neoplasm of bone: Secondary | ICD-10-CM | POA: Diagnosis not present

## 2018-07-31 LAB — SAMPLE TO BLOOD BANK

## 2018-07-31 NOTE — Progress Notes (Signed)
[facility] Palliative Care Consult Note Telephone: [phone]  Fax: [phone]  PATIENT NAME: Amanda [name] DOB: 09-Apr-1950 MRN: [id number]  PRIMARY CARE PROVIDER:   Sofie Hartigan, MD  REFERRING PROVIDER:  Sofie Hartigan, MD [place],  [place] [address]  RESPONSIBLE PARTY:   Mr. Will Schier [phone] or [phone]  Due to the COVID-19 crisis, this visit was done via telemedicine from my office and it was initiated and consent by this patient and or family.  RECOMMENDATIONS and PLAN:  1. Palliative care encounter Z51.5; Palliative medicine team will continue to support patient, patient's family, and medical team. Visit consisted of counseling and education dealing with the complex and emotionally intense issues of symptom management and palliative care in the setting of serious and potentially life-threatening illness  2.  Anorexia R63.0 but appetite remaining declined. Continue to encourage supplements and comfort feedings. Discuss nutrition  3. Generalized weaknessR53.1/secondary to disease progression; chemotherapy; non-ambulatory, . Encourage energy conservation and rest times.  ASSESSMENT:     I called Ms Soule for schedule palliative care follow-up visit. Ms Boldon answered the phone and we talked about purpose for palliative care visit Ms. Galen in agreement. We talked about how she was feeling today. She said that she was doing fine. We talked about her symptoms, appetite and she endorses that she's doing fine. She does not have any concerns or complaints. She did share that she has an upcoming scan on Tuesday. She was very vague with her answers. Emotional support provided. Talked about [facility] palliative care and plan of care. Asked if her husband Mr. Dibbern was present and she shared that he was in his office to give him a call on his cell phone. I called [name]. We talked about the discussion with Ms Needles. He  talked at length about her sharing that everything's always fine and actually it's not. He shared her appetite remains for. She is completely debilitated functionally. Cognitively she still remains fairly sharp. He talked about appointment at [facility] or they were unable to complete the transfusion. He also talked about CT scan of abdomen for re-evaluation of liver although unable to do at that time because he was not aware it was scheduled to pick contrast up. It was rescheduled for this coming Tuesday. Discuss with mr. Shaver touch base with your pen again and the goal is to see orthoscan if cancer has progressed. Mr. Killman talked at length about the challenges of caregiver fatigue, waiting and seeing your loved one declined. He talked about knowing that her quality of life is extremely poor as if a couch existence. He talked about her coming home from the cancer center in laying on the couch for hours declining to watch TV or interact. He shared that she is just exhausted. We talked about option of chaplain services and he shared that her chaplain does come by and visit. He talked about no visitors coming by to see her which is what brings her [place]. He shared that knowing that she's very fragile people are very cautious about coming to see her they don't want to make her sick. We talked about coping strategies. We talked about role of palliative care and plan of care. We talked about follow up palliative care scheduled appointment and Mr. Slaven endorses we can do that after the CT scan for further discussion of goals of care, support. Therapeutic listening and emotional support provided. Contact information provided. Questions answered  satisfaction.   I spent 45 minutes providing this consultation,  from 12:45 pm to 1:30pm. More than 50% of the time in this consultation was spent coordinating communication.   HISTORY OF PRESENT ILLNESS:  Amanda [name] is a 68 y.o. 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 Tubal ligation, right shoulder replacement, port-a-cath placement, cataract surgery bilaterally. She was last seen by Dr. 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 CT scan from 5 / 4 / 2020 reported continued progression of disease in liver. MRI of brain 5 / 18 / 2020 did not reveal metastatic disease. MRI of back 4 / 19 / 2019 did not reveal metastatic disease. She came to the clinic for next infusion of gemcitabine. She continues to have declining performance with significant fatigue and weakness. She continues with home health and home physical therapy. Chronic peripheral neuropathy which slightly improved. Her documentation hospice with end-of-life care discussed multiple occasions but Ms. Sliker declined and wishes to continue chemotherapy. No additional option after trying single-agent gemcitabine with persistent pancytopenia. She proceeded with cycle for and to return in clinic may need to consider blood transfusion and then two week for consideration of cycle 5. Appetite has been poor and she tried Remeron, dexamethasone without much effect. She did try Megace but discontinued after 2 days. She did not wish to retry. She does take Ambien for insomnia which has improved. She has completed a 14-day course of Levaquin for bilateral lung opacities. Referral was sent to Speech Pathology for further evaluation with significant symptoms of aspirating with eating and a recent normal barium swallow study. She has been having more difficulty with memory, continue to have Progressive and persistent osteitis metastases disease though MRI of brain on 5/18 / 2020 with no obvious intracranial  abnormality. Ms. Demartini does reside at home with her husband. Palliative Care was asked to help to continue to address goals of care.   CODE STATUS: DNR  PPS: 30% HOSPICE ELIGIBILITY/DIAGNOSIS: TBD  PAST MEDICAL HISTORY:  Past Medical History:  Diagnosis Date   Anxiety    Back pain    occasionally   Breast cancer ([place]) 2009   left   Depression    takes Paxil and Wellbutrin daily   Diabetes ([place])    takes Metformin daily   type 2    Genetic testing 02/03/2017   Multi-Cancer panel (83 genes) @ [facility] - No pathogenic mutations detected   GERD (gastroesophageal reflux disease)    occasional   History of bronchitis 2 yrs ago   History of kidney stones    Hyperlipidemia    takes Atorvastatin daily   Hypertension    no meds   Insomnia    takes Ambien nightly   Insomnia    takes gabapentin nightly   Joint pain    Mood swings    Osteoarthritis of knee    Personal history of chemotherapy 12/05/2016   Mets from Breast Cancer   Personal history of radiation therapy 11/2016   Pneumonia    PONV (postoperative nausea and vomiting)    Seasonal allergies    takes Allegra daily    SOCIAL HX:  Social History   Tobacco Use   Smoking status: Never Smoker   Smokeless tobacco: Never Used  Substance Use  Topics   Alcohol use: Yes    Alcohol/week: 0.0 standard drinks    Comment: occasionally wine    ALLERGIES:  Allergies  Allergen Reactions   Ace Inhibitors Cough   Latex Itching   Morphine And Related Itching    Caused her to itch terribly. Would prefer if given to take with a benadryl   Penicillins Rash    Has patient had a PCN reaction causing immediate rash, facial/tongue/throat swelling, SOB or lightheadedness with hypotension: No Has patient had a PCN reaction causing severe rash involving mucus membranes or skin necrosis: No Has patient had a PCN reaction that required hospitalization No Has patient had a PCN reaction occurring within the  last 10 years: No If all of the above answers are "NO", then may proceed with Cephalosporin use.     PERTINENT MEDICATIONS:  Outpatient Encounter Medications as of 07/31/2018  Medication Sig   acetaminophen (TYLENOL) 500 MG tablet Take 500 mg by mouth every 6 (six) hours as needed for mild pain or moderate pain.    atorvastatin (LIPITOR) 10 MG tablet Take 10 mg by mouth at bedtime.    buPROPion (WELLBUTRIN XL) 150 MG 24 hr tablet Take 150 mg by mouth at bedtime.    Calcium-Magnesium-Vitamin D (CALCIUM 1200+D3 PO) Take 1 tablet by mouth daily.    Cyanocobalamin 5000 MCG CAPS Take 5,000 mcg by mouth daily.    fexofenadine (ALLEGRA) 180 MG tablet Take 180 mg by mouth at bedtime.    gabapentin (NEURONTIN) 300 MG capsule Take 300-600 mg by mouth 2 (two) times daily. Take 300 mg by mouth in the morning and 340m at night   levofloxacin (LEVAQUIN) 500 MG tablet Take 1 tablet (500 mg total) by mouth daily. (Patient not taking: Reported on 07/30/2018)   lidocaine-prilocaine (EMLA) cream Apply 1 application topically as needed. Apply to port 1-2 hours prior to chemotherapy appointment. Cover with plastic wrap.   metFORMIN (GLUCOPHAGE) 850 MG tablet Take 850 mg by mouth 2 (two) times daily with a meal.    Oxycodone HCl 10 MG TABS Take 1 tablet (10 mg total) by mouth every 4 (four) hours as needed. Ok to take every 4-6 hours as needed.   pantoprazole (PROTONIX) 40 MG tablet Take 40 mg by mouth at bedtime.    PARoxetine (PAXIL) 40 MG tablet Take 40 mg by mouth at bedtime.    prochlorperazine (COMPAZINE) 10 MG tablet TAKE 1 TABLET BY MOUTH EVERY 6 HOURS AS NEEDED FOR  NAUSEA OR  VOMITING   sodium chloride (MURO 128) 5 % ophthalmic solution Place 1 drop into both eyes 4 (four) times daily.   zolpidem (AMBIEN) 10 MG tablet Take 1 tablet (10 mg total) by mouth at bedtime as needed (sleep).   No facility-administered encounter medications on file as of 07/31/2018.     PHYSICAL EXAM:    Deferred  Lurline Caver Z Yazmin Locher, NP

## 2018-08-01 ENCOUNTER — Other Ambulatory Visit: Payer: Self-pay

## 2018-08-01 DIAGNOSIS — E1142 Type 2 diabetes mellitus with diabetic polyneuropathy: Secondary | ICD-10-CM | POA: Diagnosis not present

## 2018-08-01 DIAGNOSIS — D630 Anemia in neoplastic disease: Secondary | ICD-10-CM | POA: Diagnosis not present

## 2018-08-01 DIAGNOSIS — C7951 Secondary malignant neoplasm of bone: Secondary | ICD-10-CM | POA: Diagnosis not present

## 2018-08-01 DIAGNOSIS — R1310 Dysphagia, unspecified: Secondary | ICD-10-CM | POA: Diagnosis not present

## 2018-08-01 DIAGNOSIS — C50912 Malignant neoplasm of unspecified site of left female breast: Secondary | ICD-10-CM | POA: Diagnosis not present

## 2018-08-01 DIAGNOSIS — C787 Secondary malignant neoplasm of liver and intrahepatic bile duct: Secondary | ICD-10-CM | POA: Diagnosis not present

## 2018-08-02 DIAGNOSIS — R1310 Dysphagia, unspecified: Secondary | ICD-10-CM | POA: Diagnosis not present

## 2018-08-02 DIAGNOSIS — C787 Secondary malignant neoplasm of liver and intrahepatic bile duct: Secondary | ICD-10-CM | POA: Diagnosis not present

## 2018-08-02 DIAGNOSIS — D630 Anemia in neoplastic disease: Secondary | ICD-10-CM | POA: Diagnosis not present

## 2018-08-02 DIAGNOSIS — C7951 Secondary malignant neoplasm of bone: Secondary | ICD-10-CM | POA: Diagnosis not present

## 2018-08-02 DIAGNOSIS — C50912 Malignant neoplasm of unspecified site of left female breast: Secondary | ICD-10-CM | POA: Diagnosis not present

## 2018-08-02 DIAGNOSIS — E1142 Type 2 diabetes mellitus with diabetic polyneuropathy: Secondary | ICD-10-CM | POA: Diagnosis not present

## 2018-08-03 NOTE — Progress Notes (Signed)
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  INTERVAL HISTORY: Patient returns to clinic today for further evaluation, discussion of her imaging results, and whether or not to continue treatment.  She continues to have a poor performance status and significant weakness and fatigue. She has chronic peripheral neuropathy which is unchanged.  She has no other neurologic complaints.  She denies any recent fevers or illnesses.  She denies any chest pain, shortness of breath or hemoptysis.  She continues to have occasional cough, particularly with eating.  She denies any nausea, vomiting, constipation, or diarrhea. She has no urinary complaints.  Patient feels generally terrible, but offers no further specific complaints today.  REVIEW OF SYSTEMS:   Review of Systems  Constitutional: Positive for malaise/fatigue and weight loss. Negative for fever.  Eyes: Negative.  Negative for blurred vision, double vision and pain.  Respiratory: Positive for cough. Negative for shortness of breath.   Cardiovascular: Negative.  Negative for chest pain and leg swelling.  Gastrointestinal: Negative.  Negative for abdominal pain.  Genitourinary: Negative.  Negative for dysuria and flank pain.  Musculoskeletal: Negative for back pain and falls.  Skin: Negative.  Negative for rash.  Neurological: Positive for tingling, sensory change and weakness. Negative for focal weakness and headaches.  Endo/Heme/Allergies: Negative.   Psychiatric/Behavioral: Positive for memory loss. The patient is not nervous/anxious and does not have insomnia.     As per HPI. Otherwise, a complete  review of systems is negative.  PAST MEDICAL HISTORY: Past Medical History:  Diagnosis Date   Anxiety    Back pain    occasionally   Breast cancer ([place]) 2009   left   Depression    takes Paxil and Wellbutrin daily   Diabetes ([place])    takes Metformin daily   type 2    Genetic testing 02/03/2017   Multi-Cancer panel (83 genes) @ [facility] - No pathogenic mutations detected   GERD (gastroesophageal reflux disease)    occasional   History of bronchitis 2 yrs ago   History of kidney stones    Hyperlipidemia    takes Atorvastatin daily   Hypertension    no meds   Insomnia    takes Ambien nightly   Insomnia    takes gabapentin nightly   Joint pain    Mood swings    Osteoarthritis of knee    Personal history of chemotherapy 12/05/2016   Mets from Breast Cancer   Personal history of radiation therapy 11/2016   Pneumonia    PONV (postoperative nausea and vomiting)    Seasonal allergies    takes Allegra daily    PAST SURGICAL HISTORY: Past Surgical History:  Procedure Laterality Date   BREAST BIOPSY  2009   cataract surgery Bilateral    COLONOSCOPY     IR RADIOLOGIST EVAL & MGMT  01/29/2018   JOINT REPLACEMENT Left 2014   knee   KNEE ARTHROSCOPY Left    x 5   MASTECTOMY Left    port a cath placed     PORTACATH PLACEMENT N/A 09/17/2015   Procedure: INSERTION PORT-A-CATH;  Surgeon: Jules Husbands, MD;  Location: [facility];  Service: General;  Laterality: N/A;   RADIOLOGY WITH  ANESTHESIA N/A 02/20/2018   Procedure: CT WITH ANESTHESIA MICROWAVE THERMAL ABLATION-LIVER;  Surgeon: Jacqulynn Cadet, MD;  Location: [facility];  Service: Anesthesiology;  Laterality: N/A;   TOOTH EXTRACTION     TOTAL SHOULDER ARTHROPLASTY Right 06/03/2015   Procedure: TOTAL SHOULDER ARTHROPLASTY;  Surgeon: Tania Ade, MD;  Location: [facility];  Service: Orthopedics;  Laterality: Right;  Right total shoulder arthroplasty   TOTAL SHOULDER REPLACEMENT Right 06/03/2015    TUBAL LIGATION      FAMILY HISTORY: Father with non-Hodgkin's lymphoma, 2 paternal aunts with breast cancer.     ADVANCED DIRECTIVES:    HEALTH MAINTENANCE: Social History   Tobacco Use   Smoking status: Never Smoker   Smokeless tobacco: Never Used  Substance Use Topics   Alcohol use: Yes    Alcohol/week: 0.0 standard drinks    Comment: occasionally wine   Drug use: Yes    Types: Marijuana    Comment: cannabis with no extra  low dose edibles  for neuropathy     Colonoscopy:  PAP:  Bone density:  Lipid panel:  Allergies  Allergen Reactions   Ace Inhibitors Cough   Latex Itching   Morphine And Related Itching    Caused her to itch terribly. Would prefer if given to take with a benadryl   Penicillins Rash    Has patient had a PCN reaction causing immediate rash, facial/tongue/throat swelling, SOB or lightheadedness with hypotension: No Has patient had a PCN reaction causing severe rash involving mucus membranes or skin necrosis: No Has patient had a PCN reaction that required hospitalization No Has patient had a PCN reaction occurring within the last 10 years: No If all of the above answers are "NO", then may proceed with Cephalosporin use.    Current Outpatient Medications  Medication Sig Dispense Refill   acetaminophen (TYLENOL) 500 MG tablet Take 500 mg by mouth every 6 (six) hours as needed for mild pain or moderate pain.      atorvastatin (LIPITOR) 10 MG tablet Take 10 mg by mouth at bedtime.      buPROPion (WELLBUTRIN XL) 150 MG 24 hr tablet Take 150 mg by mouth at bedtime.      Calcium-Magnesium-Vitamin D (CALCIUM 1200+D3 PO) Take 1 tablet by mouth daily.      Cyanocobalamin 5000 MCG CAPS Take 5,000 mcg by mouth daily.      fexofenadine (ALLEGRA) 180 MG tablet Take 180 mg by mouth at bedtime.      gabapentin (NEURONTIN) 300 MG capsule Take 300-600 mg by mouth 2 (two) times daily. Take 300 mg by mouth in the morning and '300mg'$  at night      levofloxacin (LEVAQUIN) 500 MG tablet Take 1 tablet (500 mg total) by mouth daily. 14 tablet 0   lidocaine-prilocaine (EMLA) cream Apply 1 application topically as needed. Apply to port 1-2 hours prior to chemotherapy appointment. Cover with plastic wrap. 30 g 0   metFORMIN (GLUCOPHAGE) 850 MG tablet Take 850 mg by mouth 2 (two) times daily with a meal.      Oxycodone HCl 10 MG TABS Take 1 tablet (10 mg total) by mouth every 4 (four) hours as needed. Ok to take every 4-6 hours as needed. 90 tablet 0   pantoprazole (PROTONIX) 40 MG tablet Take 40 mg by mouth at bedtime.      PARoxetine (PAXIL) 40 MG tablet Take 40 mg by mouth at bedtime.      prochlorperazine (COMPAZINE) 10 MG tablet TAKE 1 TABLET BY MOUTH EVERY 6  HOURS AS NEEDED FOR  NAUSEA OR  VOMITING 120 tablet 1   sodium chloride (MURO 128) 5 % ophthalmic solution Place 1 drop into both eyes 4 (four) times daily.     zolpidem (AMBIEN) 10 MG tablet Take 1 tablet (10 mg total) by mouth at bedtime as needed (sleep). 90 tablet 0   No current facility-administered medications for this visit.     OBJECTIVE: Vitals:   08/06/18 0948  BP: 113/77  Pulse: (!) 124  Temp: (!) 97.5 F (36.4 C)     Body mass index is 23.69 kg/m.    ECOG FS:3 - Symptomatic, >50% confined to bed  General: Thin, no acute distress.  Sitting in a wheelchair. Eyes: Pink conjunctiva, anicteric sclera. HEENT: Normocephalic, moist mucous membranes. Lungs: Clear to auscultation bilaterally. Heart: Regular rate and rhythm. No rubs, murmurs, or gallops. Abdomen: Soft, nontender, nondistended. No organomegaly noted, normoactive bowel sounds. Musculoskeletal: No edema, cyanosis, or clubbing. Neuro: Alert, answering all questions appropriately. Cranial nerves grossly intact. Skin: No rashes or petechiae noted. Psych: Normal affect.  LAB RESULTS:  Lab Results  Component Value Date   NA 136 08/06/2018   K 4.4 08/06/2018   CL 101 08/06/2018   CO2 24 08/06/2018    GLUCOSE 233 (H) 08/06/2018   BUN 29 (H) 08/06/2018   CREATININE 0.74 08/06/2018   CALCIUM 8.3 (L) 08/06/2018   PROT 6.1 (L) 08/06/2018   ALBUMIN 3.3 (L) 08/06/2018   AST 60 (H) 08/06/2018   ALT 39 08/06/2018   ALKPHOS 161 (H) 08/06/2018   BILITOT 0.8 08/06/2018   GFRNONAA >60 08/06/2018   GFRAA >60 08/06/2018    Lab Results  Component Value Date   WBC 6.8 08/06/2018   NEUTROABS 4.0 08/06/2018   HGB 9.6 (L) 08/06/2018   HCT 33.0 (L) 08/06/2018   MCV 93.8 08/06/2018   PLT 382 08/06/2018     STUDIES: Ct Chest W Contrast  Result Date: 08/06/2018 CLINICAL DATA:  Metastatic breast cancer. EXAM: CT CHEST, ABDOMEN, AND PELVIS WITH CONTRAST TECHNIQUE: Multidetector CT imaging of the chest, abdomen and pelvis was performed following the standard protocol during bolus administration of intravenous contrast. CONTRAST:  156m OMNIPAQUE IOHEXOL 300 MG/ML  SOLN COMPARISON:  05/20/2018 FINDINGS: CT CHEST FINDINGS Cardiovascular: The heart is normal in size. No pericardial effusion. The aorta is normal in caliber. No dissection. Stable coronary artery calcifications. The right-sided Port-A-Cath is in good position. Mediastinum/Nodes: No mediastinal or hilar mass or lymphadenopathy. The esophagus is grossly normal. Small hiatal hernia noted. Lungs/Pleura: Improved aeration of the left lung. There is moderate postinflammatory or postinfectious scarring type changes with interstitial thickening and linear fibrosis. Associated mild loss of volume in the left hemithorax. Stable underlying emphysematous changes. No worrisome pulmonary nodules to suggest pulmonary metastatic disease. No pleural effusions or pleural lesions. Musculoskeletal: Stable surgical changes from a left mastectomy. Stable fluid collection in the left chest wall. No obvious recurrent mass and no supraclavicular or axillary adenopathy. Stable diffuse lytic and sclerotic osseous metastatic disease involving the spine, sternum and ribs. No  pathologic fracture or spinal canal compromise. CT ABDOMEN PELVIS FINDINGS Hepatobiliary: Much improved CT appearance of the liver. Prior study showed diffuse confluent hepatic metastatic disease. Now there are just small scattered low-attenuation lesions in both lobes. The largest lesion is in segment 5 and measures 2.3 cm. This is stable. All the other lesions measure 10 cm or less. The gallbladder is unremarkable.  No common bile duct dilatation. Pancreas: No mass, inflammation or ductal  dilatation. Spleen: Normal size. Stable enhancing lesion with overlying capsular retraction, likely benign hemangioma. Adrenals/Urinary Tract: The adrenal glands and kidneys are unremarkable and stable. Left-sided pararenal cysts are noted. Stomach/Bowel: The stomach, duodenum, small bowel and colon are unremarkable. No acute inflammatory changes, mass lesions or obstructive findings. The terminal ileum is normal. Large amount of stool in the rectum may suggest fecal impaction. Vascular/Lymphatic: The aorta is normal in caliber. No dissection. The branch vessels are patent. The major venous structures are patent. No mesenteric or retroperitoneal mass or adenopathy. Small scattered lymph nodes are noted. Reproductive: The uterus and ovaries are unremarkable. Other: No pelvic mass or adenopathy. No free pelvic fluid collections. No inguinal mass or adenopathy. No abdominal wall hernia or subcutaneous lesions. Musculoskeletal: Diffuse mixed lytic and sclerotic osseous metastatic disease involving the spine and pelvis. No pathologic fracture or spinal canal compromise. IMPRESSION: 1. Resolution of diffuse airspace process seen in the left lung on the prior CT scan. There are postinflammatory or postinfectious scarring changes. 2. No worrisome pulmonary lesions to suggest pulmonary metastatic disease. No mediastinal or hilar adenopathy. 3. Stable surgical changes from a left mastectomy. Postoperative fluid collection in the left chest  wall but no findings for recurrent tumor. 4. Much improved CT appearance of the liver. Scattered small residual lesions are noted. 5. Stable mixed lytic and sclerotic osseous metastatic disease without new/progressive findings or pathologic fracture. Electronically Signed   By: Marijo Sanes M.D.   On: 08/06/2018 11:00   Ct Abdomen Pelvis W Contrast  Result Date: 08/06/2018 CLINICAL DATA:  Metastatic breast cancer. EXAM: CT CHEST, ABDOMEN, AND PELVIS WITH CONTRAST TECHNIQUE: Multidetector CT imaging of the chest, abdomen and pelvis was performed following the standard protocol during bolus administration of intravenous contrast. CONTRAST:  163m OMNIPAQUE IOHEXOL 300 MG/ML  SOLN COMPARISON:  05/20/2018 FINDINGS: CT CHEST FINDINGS Cardiovascular: The heart is normal in size. No pericardial effusion. The aorta is normal in caliber. No dissection. Stable coronary artery calcifications. The right-sided Port-A-Cath is in good position. Mediastinum/Nodes: No mediastinal or hilar mass or lymphadenopathy. The esophagus is grossly normal. Small hiatal hernia noted. Lungs/Pleura: Improved aeration of the left lung. There is moderate postinflammatory or postinfectious scarring type changes with interstitial thickening and linear fibrosis. Associated mild loss of volume in the left hemithorax. Stable underlying emphysematous changes. No worrisome pulmonary nodules to suggest pulmonary metastatic disease. No pleural effusions or pleural lesions. Musculoskeletal: Stable surgical changes from a left mastectomy. Stable fluid collection in the left chest wall. No obvious recurrent mass and no supraclavicular or axillary adenopathy. Stable diffuse lytic and sclerotic osseous metastatic disease involving the spine, sternum and ribs. No pathologic fracture or spinal canal compromise. CT ABDOMEN PELVIS FINDINGS Hepatobiliary: Much improved CT appearance of the liver. Prior study showed diffuse confluent hepatic metastatic disease.  Now there are just small scattered low-attenuation lesions in both lobes. The largest lesion is in segment 5 and measures 2.3 cm. This is stable. All the other lesions measure 10 cm or less. The gallbladder is unremarkable.  No common bile duct dilatation. Pancreas: No mass, inflammation or ductal dilatation. Spleen: Normal size. Stable enhancing lesion with overlying capsular retraction, likely benign hemangioma. Adrenals/Urinary Tract: The adrenal glands and kidneys are unremarkable and stable. Left-sided pararenal cysts are noted. Stomach/Bowel: The stomach, duodenum, small bowel and colon are unremarkable. No acute inflammatory changes, mass lesions or obstructive findings. The terminal ileum is normal. Large amount of stool in the rectum may suggest fecal impaction. Vascular/Lymphatic: The aorta is  normal in caliber. No dissection. The branch vessels are patent. The major venous structures are patent. No mesenteric or retroperitoneal mass or adenopathy. Small scattered lymph nodes are noted. Reproductive: The uterus and ovaries are unremarkable. Other: No pelvic mass or adenopathy. No free pelvic fluid collections. No inguinal mass or adenopathy. No abdominal wall hernia or subcutaneous lesions. Musculoskeletal: Diffuse mixed lytic and sclerotic osseous metastatic disease involving the spine and pelvis. No pathologic fracture or spinal canal compromise. IMPRESSION: 1. Resolution of diffuse airspace process seen in the left lung on the prior CT scan. There are postinflammatory or postinfectious scarring changes. 2. No worrisome pulmonary lesions to suggest pulmonary metastatic disease. No mediastinal or hilar adenopathy. 3. Stable surgical changes from a left mastectomy. Postoperative fluid collection in the left chest wall but no findings for recurrent tumor. 4. Much improved CT appearance of the liver. Scattered small residual lesions are noted. 5. Stable mixed lytic and sclerotic osseous metastatic disease  without new/progressive findings or pathologic fracture. Electronically Signed   By: Marijo Sanes M.D.   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  PLAN:    1.  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  There is no other obvious sites of disease. MRI of the brain on Jun 03, 2018 did not reveal metastatic disease.  Hospice and end-of-life care have been discussed on multiple occasions, but patient declined and wished to try additional chemotherapy.  She expressed understanding that given her performance status, there is likely no additional options after trying single agent gemcitabine.  Given patient's persistent pancytopenia, she will likely only be able to tolerate treatment every other week.  Patient does not wish to pursue treatment today, but would like to return Thursday for continuation of gemcitabine.  Patient will then return to clinic in 1 week for laboratory work and possible blood transfusion and then in 2 weeks for further evaluation and consideration of cycle 7 of gemcitabine.  2.  Anemia: Hemoglobin has improved to 9.6.  She does not require transfusion. 3.  Peripheral neuropathy: Chronic and unchanged.   4.  Back pain: MRI results from May 04, 2017 did not reveal metastatic disease.  Continue symptomatic treatment.  5.  Left IJ clot: Ultrasound results reviewed independently.  Patient has been instructed to discontinue Eliquis. 6.  Neutropenia: Resolved. 7.  Memory complaints/confusion: Repeat MRI of the brain on Jun 03, 2018 reviewed independently with no obvious intracranial abnormality.  Patient continues to have progressive and persistent osseous metastatic disease. 8.  Elevated liver enzymes:  Nearly resolved.  Gemcitabine does not need to be dose reduced in the setting of liver dysfunction. 9.  Apparent aspiration: Despite a recent normal barium swallow, patient still describes symptoms of significant aspiration with eating.  Patient is now being seen by speech pathology with home health.   10.  Poor appetite: Patient has tried Remeron and dexamethasone without much effect.  She only took 2 days of Megace prior to discontinuing.  Patient does not wish to retry Megace at this time.  Appreciate dietary input.   11.  Insomnia: Improved.  Continue Ambien as prescribed.   12.  Bilateral lung opacities: Essentially resolved.  Patient has completed a 14-day course of Levaquin.  Patient expressed understanding and  was in agreement with this plan. She also understands that She can call clinic at any time with any questions, concerns, or complaints.   Breast cancer   Staging form: Breast, AJCC 7th Edition     Pathologic stage from 08/11/2014: Stage IIIA (T0, N2a, cM0) - Signed by Lloyd Huger, MD on 08/11/2014   Lloyd Huger, MD   08/07/2018 7:27 AM

## 2018-08-06 ENCOUNTER — Ambulatory Visit
Admission: RE | Admit: 2018-08-06 | Discharge: 2018-08-06 | Disposition: A | Discharge status: Home / Self Care | Payer: Medicare Other | Source: Ambulatory Visit | Attending: Oncology | Admitting: Oncology

## 2018-08-06 ENCOUNTER — Inpatient Hospital Stay (HOSPITAL_BASED_OUTPATIENT_CLINIC_OR_DEPARTMENT_OTHER): Payer: Medicare Other | Admitting: Hospice and Palliative Medicine

## 2018-08-06 ENCOUNTER — Inpatient Hospital Stay: Payer: Medicare Other

## 2018-08-06 ENCOUNTER — Inpatient Hospital Stay (HOSPITAL_BASED_OUTPATIENT_CLINIC_OR_DEPARTMENT_OTHER): Payer: Medicare Other | Admitting: Oncology

## 2018-08-06 ENCOUNTER — Encounter: Payer: Self-pay | Admitting: Oncology

## 2018-08-06 ENCOUNTER — Other Ambulatory Visit: Payer: Self-pay

## 2018-08-06 VITALS — BP 113/77 | HR 124 | Temp 97.5°F | Ht 64.0 in | Wt 138.0 lb

## 2018-08-06 DIAGNOSIS — G620 Drug-induced polyneuropathy: Secondary | ICD-10-CM | POA: Diagnosis not present

## 2018-08-06 DIAGNOSIS — K769 Liver disease, unspecified: Secondary | ICD-10-CM | POA: Insufficient documentation

## 2018-08-06 DIAGNOSIS — D61818 Other pancytopenia: Secondary | ICD-10-CM | POA: Diagnosis not present

## 2018-08-06 DIAGNOSIS — C50912 Malignant neoplasm of unspecified site of left female breast: Secondary | ICD-10-CM

## 2018-08-06 DIAGNOSIS — C787 Secondary malignant neoplasm of liver and intrahepatic bile duct: Secondary | ICD-10-CM | POA: Diagnosis not present

## 2018-08-06 DIAGNOSIS — Z9012 Acquired absence of left breast and nipple: Secondary | ICD-10-CM | POA: Diagnosis not present

## 2018-08-06 DIAGNOSIS — C50919 Malignant neoplasm of unspecified site of unspecified female breast: Secondary | ICD-10-CM

## 2018-08-06 DIAGNOSIS — R05 Cough: Secondary | ICD-10-CM

## 2018-08-06 DIAGNOSIS — Z95828 Presence of other vascular implants and grafts: Secondary | ICD-10-CM

## 2018-08-06 DIAGNOSIS — C7951 Secondary malignant neoplasm of bone: Secondary | ICD-10-CM | POA: Diagnosis not present

## 2018-08-06 DIAGNOSIS — D649 Anemia, unspecified: Secondary | ICD-10-CM

## 2018-08-06 DIAGNOSIS — G4700 Insomnia, unspecified: Secondary | ICD-10-CM | POA: Diagnosis not present

## 2018-08-06 DIAGNOSIS — Z515 Encounter for palliative care: Secondary | ICD-10-CM

## 2018-08-06 DIAGNOSIS — R531 Weakness: Secondary | ICD-10-CM

## 2018-08-06 LAB — CBC WITH DIFFERENTIAL/PLATELET
Abs Immature Granulocytes: 0.05 10*3/uL (ref 0.00–0.07)
Basophils Absolute: 0 10*3/uL (ref 0.0–0.1)
Basophils Relative: 0 %
Eosinophils Absolute: 0 10*3/uL (ref 0.0–0.5)
Eosinophils Relative: 0 %
HCT: 33 % — ABNORMAL LOW (ref 36.0–46.0)
Hemoglobin: 9.6 g/dL — ABNORMAL LOW (ref 12.0–15.0)
Immature Granulocytes: 1 %
Lymphocytes Relative: 26 %
Lymphs Abs: 1.8 10*3/uL (ref 0.7–4.0)
MCH: 27.3 pg (ref 26.0–34.0)
MCHC: 29.1 g/dL — ABNORMAL LOW (ref 30.0–36.0)
MCV: 93.8 fL (ref 80.0–100.0)
Monocytes Absolute: 1 10*3/uL (ref 0.1–1.0)
Monocytes Relative: 14 %
Neutro Abs: 4 10*3/uL (ref 1.7–7.7)
Neutrophils Relative %: 59 %
Platelets: 382 10*3/uL (ref 150–400)
RBC: 3.52 MIL/uL — ABNORMAL LOW (ref 3.87–5.11)
RDW: 18.1 % — ABNORMAL HIGH (ref 11.5–15.5)
WBC: 6.8 10*3/uL (ref 4.0–10.5)
nRBC: 0 % (ref 0.0–0.2)

## 2018-08-06 LAB — COMPREHENSIVE METABOLIC PANEL
ALT: 39 U/L (ref 0–44)
AST: 60 U/L — ABNORMAL HIGH (ref 15–41)
Albumin: 3.3 g/dL — ABNORMAL LOW (ref 3.5–5.0)
Alkaline Phosphatase: 161 U/L — ABNORMAL HIGH (ref 38–126)
Anion gap: 11 (ref 5–15)
BUN: 29 mg/dL — ABNORMAL HIGH (ref 8–23)
CO2: 24 mmol/L (ref 22–32)
Calcium: 8.3 mg/dL — ABNORMAL LOW (ref 8.9–10.3)
Chloride: 101 mmol/L (ref 98–111)
Creatinine, Ser: 0.74 mg/dL (ref 0.44–1.00)
GFR calc Af Amer: >60 mL/min (ref >60)
GFR calc non Af Amer: >60 mL/min (ref >60)
Glucose, Bld: 233 mg/dL — ABNORMAL HIGH (ref 70–99)
Potassium: 4.4 mmol/L (ref 3.5–5.1)
Sodium: 136 mmol/L (ref 135–145)
Total Bilirubin: 0.8 mg/dL (ref 0.3–1.2)
Total Protein: 6.1 g/dL — ABNORMAL LOW (ref 6.5–8.1)

## 2018-08-06 MED ORDER — IOHEXOL 300 MG/ML  SOLN
100.0000 mL | Freq: Once | INTRAMUSCULAR | Status: AC | PRN
  Administered 2018-08-06: 08:00:00 100 mL via INTRAVENOUS

## 2018-08-06 MED ORDER — HEPARIN SOD (PORK) LOCK FLUSH 100 UNIT/ML IV SOLN
500.0000 [IU] | Freq: Once | INTRAVENOUS | Status: AC
  Administered 2018-08-06: 12:00:00 500 [IU] via INTRAVENOUS

## 2018-08-06 MED ORDER — SODIUM CHLORIDE 0.9% FLUSH
10.0000 mL | Freq: Once | INTRAVENOUS | Status: AC
  Administered 2018-08-06: 10 mL via INTRAVENOUS
  Filled 2018-08-06: qty 10

## 2018-08-06 NOTE — Progress Notes (Signed)
[place]  [phone] [phone]   Name: Amanda [name] Date: 08/06/2018 MRN: [id number]  DOB: [birth year]  Patient Care Team: Sofie Hartigan, MD as PCP - General (Family Medicine) Dahlia Byes, Marjory Lies, MD as Consulting Physician (General Surgery)    REASON FOR CONSULTATION: Palliative Care consult requested for this 68 y.o. 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  She was found to have disease progression on Ibrance and letrozole.  Patient previously had verbalized a desire not to pursue further treatment in the event of disease progression.  However, she is decided to start [place].  Palliative care was consulted to help address goals and follow for support.  SOCIAL HISTORY:    Patient lives at home with her husband.  She has a son and stepson who are involved.  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  Her husband owns a "ozone" business.  ADVANCE DIRECTIVES:  Not on file. Has HCPOA and living will at home.   CODE STATUS: DNR  PAST MEDICAL HISTORY: Past Medical History:  Diagnosis Date   Anxiety    Back pain    occasionally   Breast cancer ([place]) 2009   left   Depression    takes Paxil and Wellbutrin daily   Diabetes ([place])    takes Metformin daily   type 2    Genetic testing 02/03/2017   Multi-Cancer panel (83 genes) @ [facility] - No pathogenic mutations detected   GERD (gastroesophageal reflux disease)    occasional   History of bronchitis 2 yrs ago   History of kidney stones    Hyperlipidemia    takes Atorvastatin daily   Hypertension    no meds   Insomnia    takes Ambien nightly   Insomnia    takes gabapentin nightly   Joint pain    Mood swings    Osteoarthritis of knee    Personal history of chemotherapy 12/05/2016   Mets from Breast Cancer   Personal history of radiation therapy 11/2016    Pneumonia    PONV (postoperative nausea and vomiting)    Seasonal allergies    takes Allegra daily    PAST SURGICAL HISTORY:  Past Surgical History:  Procedure Laterality Date   BREAST BIOPSY  2009   cataract surgery Bilateral    COLONOSCOPY     IR RADIOLOGIST EVAL & MGMT  01/29/2018   JOINT REPLACEMENT Left 2014   knee   KNEE ARTHROSCOPY Left    x 5   MASTECTOMY Left    port a cath placed     PORTACATH PLACEMENT N/A 09/17/2015   Procedure: INSERTION PORT-A-CATH;  Surgeon: Jules Husbands, MD;  Location: [facility] ORS;  Service: General;  Laterality: N/A;   RADIOLOGY WITH ANESTHESIA N/A 02/20/2018   Procedure: CT WITH ANESTHESIA MICROWAVE THERMAL ABLATION-LIVER;  Surgeon: Jacqulynn Cadet, MD;  Location: [facility];  Service: Anesthesiology;  Laterality: N/A;   TOOTH EXTRACTION     TOTAL SHOULDER ARTHROPLASTY Right 06/03/2015   Procedure: TOTAL SHOULDER ARTHROPLASTY;  Surgeon: Tania Ade, MD;  Location: [facility];  Service: Orthopedics;  Laterality: Right;  Right total shoulder arthroplasty   TOTAL SHOULDER REPLACEMENT Right 06/03/2015   TUBAL LIGATION      HEMATOLOGY/ONCOLOGY HISTORY:  Oncology History  Cancer of left female breast  ([place])  07/27/2014 Initial Diagnosis   Cancer of left female breast [facility])   Metastatic  breast cancer ([place])  09/24/2015 Initial Diagnosis   Metastatic breast cancer ([place])   02/27/2018 - 05/21/2018 Chemotherapy   The patient had pegfilgrastim (NEULASTA) injection 6 mg, 6 mg, Subcutaneous, Once, 1 of 1 cycle Administration: 6 mg (04/19/2018) pegfilgrastim (NEULASTA ONPRO KIT) injection 6 mg, 6 mg, Subcutaneous, Once, 1 of 2 cycles Administration: 6 mg (05/01/2018), 6 mg (05/15/2018) eriBULin mesylate (HALAVEN) 2.75 mg in sodium chloride 0.9 % 100 mL chemo infusion, 1.4 mg/m2 = 2.75 mg, Intravenous,  Once, 3 of 4 cycles Dose modification: 1.1 mg/m2 (original dose 1.4 mg/m2, Cycle 1, Reason: Dose not tolerated) Administration: 2.75 mg (02/27/2018), 2.15 mg  (03/20/2018), 2.15 mg (04/04/2018), 2.15 mg (04/17/2018), 2.15 mg (05/01/2018), 2.15 mg (05/15/2018)  for chemotherapy treatment.    06/04/2018 -  Chemotherapy   The patient had gemcitabine (GEMZAR) 1,400 mg in sodium chloride 0.9 % 250 mL chemo infusion, 1,406 mg, Intravenous,  Once, 4 of 8 cycles Administration: 1,400 mg (06/04/2018), 1,400 mg (06/18/2018), 1,400 mg (07/02/2018), 1,400 mg (07/16/2018)  for chemotherapy treatment.      ALLERGIES:  is allergic to ace inhibitors; latex; morphine and related; and penicillins.  MEDICATIONS:  Current Outpatient Medications  Medication Sig Dispense Refill   acetaminophen (TYLENOL) 500 MG tablet Take 500 mg by mouth every 6 (six) hours as needed for mild pain or moderate pain.      atorvastatin (LIPITOR) 10 MG tablet Take 10 mg by mouth at bedtime.      buPROPion (WELLBUTRIN XL) 150 MG 24 hr tablet Take 150 mg by mouth at bedtime.      Calcium-Magnesium-Vitamin D (CALCIUM 1200+D3 PO) Take 1 tablet by mouth daily.      Cyanocobalamin 5000 MCG CAPS Take 5,000 mcg by mouth daily.      fexofenadine (ALLEGRA) 180 MG tablet Take 180 mg by mouth at bedtime.      gabapentin (NEURONTIN) 300 MG capsule Take 300-600 mg by mouth 2 (two) times daily. Take 300 mg by mouth in the morning and 335m at night     levofloxacin (LEVAQUIN) 500 MG tablet Take 1 tablet (500 mg total) by mouth daily. 14 tablet 0   lidocaine-prilocaine (EMLA) cream Apply 1 application topically as needed. Apply to port 1-2 hours prior to chemotherapy appointment. Cover with plastic wrap. 30 g 0   metFORMIN (GLUCOPHAGE) 850 MG tablet Take 850 mg by mouth 2 (two) times daily with a meal.      Oxycodone HCl 10 MG TABS Take 1 tablet (10 mg total) by mouth every 4 (four) hours as needed. Ok to take every 4-6 hours as needed. 90 tablet 0   pantoprazole (PROTONIX) 40 MG tablet Take 40 mg by mouth at bedtime.      PARoxetine (PAXIL) 40 MG tablet Take 40 mg by mouth at bedtime.       prochlorperazine (COMPAZINE) 10 MG tablet TAKE 1 TABLET BY MOUTH EVERY 6 HOURS AS NEEDED FOR  NAUSEA OR  VOMITING 120 tablet 1   sodium chloride (MURO 128) 5 % ophthalmic solution Place 1 drop into both eyes 4 (four) times daily.     zolpidem (AMBIEN) 10 MG tablet Take 1 tablet (10 mg total) by mouth at bedtime as needed (sleep). 90 tablet 0   No current facility-administered medications for this visit.     VITAL SIGNS: There were no vitals taken for this visit. There were no vitals filed for this visit.  Estimated body mass index is 23.69 kg/m as calculated from the following:  Height as of an earlier encounter on 08/06/18: _0  (1.626 m).   Weight as of an earlier encounter on 08/06/18: 138 lb (62.6 kg).  LABS: CBC:    Component Value Date/Time   WBC 6.8 08/06/2018 0910   HGB 9.6 (L) 08/06/2018 0910   HGB 13.2 12/20/2011 0927   HCT 33.0 (L) 08/06/2018 0910   HCT 40.6 12/20/2011 0927   PLT 382 08/06/2018 0910   PLT 235 12/20/2011 0927   MCV 93.8 08/06/2018 0910   MCV 96 12/20/2011 0927   NEUTROABS 4.0 08/06/2018 0910   NEUTROABS 1.7 12/20/2011 0927   LYMPHSABS 1.8 08/06/2018 0910   LYMPHSABS 1.4 12/20/2011 0927   MONOABS 1.0 08/06/2018 0910   MONOABS 0.4 12/20/2011 0927   EOSABS 0.0 08/06/2018 0910   EOSABS 0.4 12/20/2011 0927   BASOSABS 0.0 08/06/2018 0910   BASOSABS 0.0 12/20/2011 0927   Comprehensive Metabolic Panel:    Component Value Date/Time   NA 136 08/06/2018 0910   NA 139 12/20/2011 0927   K 4.4 08/06/2018 0910   K 4.1 12/20/2011 0927   CL 101 08/06/2018 0910   CL 102 12/20/2011 0927   CO2 24 08/06/2018 0910   CO2 26 12/20/2011 0927   BUN 29 (H) 08/06/2018 0910   BUN 19 (H) 12/20/2011 0927   CREATININE 0.74 08/06/2018 0910   CREATININE 1.15 12/20/2011 0927   GLUCOSE 233 (H) 08/06/2018 0910   GLUCOSE 251 (H) 12/20/2011 0927   CALCIUM 8.3 (L) 08/06/2018 0910   CALCIUM 8.6 12/20/2011 0927   AST 60 (H) 08/06/2018 0910   AST 49 (H) 12/20/2011 0927    ALT 39 08/06/2018 0910   ALT 57 12/20/2011 0927   ALKPHOS 161 (H) 08/06/2018 0910   ALKPHOS 61 12/20/2011 0927   BILITOT 0.8 08/06/2018 0910   BILITOT 0.4 12/20/2011 0927   PROT 6.1 (L) 08/06/2018 0910   PROT 6.8 12/20/2011 0927   ALBUMIN 3.3 (L) 08/06/2018 0910   ALBUMIN 3.6 12/20/2011 0927    RADIOGRAPHIC STUDIES: Ct Chest W Contrast  Result Date: 08/06/2018 CLINICAL DATA:  Metastatic breast cancer. EXAM: CT CHEST, ABDOMEN, AND PELVIS WITH CONTRAST TECHNIQUE: Multidetector CT imaging of the chest, abdomen and pelvis was performed following the standard protocol during bolus administration of intravenous contrast. CONTRAST:  160m OMNIPAQUE IOHEXOL 300 MG/ML  SOLN COMPARISON:  05/20/2018 FINDINGS: CT CHEST FINDINGS Cardiovascular: The heart is normal in size. No pericardial effusion. The aorta is normal in caliber. No dissection. Stable coronary artery calcifications. The right-sided Port-A-Cath is in good position. Mediastinum/Nodes: No mediastinal or hilar mass or lymphadenopathy. The esophagus is grossly normal. Small hiatal hernia noted. Lungs/Pleura: Improved aeration of the left lung. There is moderate postinflammatory or postinfectious scarring type changes with interstitial thickening and linear fibrosis. Associated mild loss of volume in the left hemithorax. Stable underlying emphysematous changes. No worrisome pulmonary nodules to suggest pulmonary metastatic disease. No pleural effusions or pleural lesions. Musculoskeletal: Stable surgical changes from a left mastectomy. Stable fluid collection in the left chest wall. No obvious recurrent mass and no supraclavicular or axillary adenopathy. Stable diffuse lytic and sclerotic osseous metastatic disease involving the spine, sternum and ribs. No pathologic fracture or spinal canal compromise. CT ABDOMEN PELVIS FINDINGS Hepatobiliary: Much improved CT appearance of the liver. Prior study showed diffuse confluent hepatic metastatic disease. Now  there are just small scattered low-attenuation lesions in both lobes. The largest lesion is in segment 5 and measures 2.3 cm. This is stable. All the other lesions measure  10 cm or less. The gallbladder is unremarkable.  No common bile duct dilatation. Pancreas: No mass, inflammation or ductal dilatation. Spleen: Normal size. Stable enhancing lesion with overlying capsular retraction, likely benign hemangioma. Adrenals/Urinary Tract: The adrenal glands and kidneys are unremarkable and stable. Left-sided pararenal cysts are noted. Stomach/Bowel: The stomach, duodenum, small bowel and colon are unremarkable. No acute inflammatory changes, mass lesions or obstructive findings. The terminal ileum is normal. Large amount of stool in the rectum may suggest fecal impaction. Vascular/Lymphatic: The aorta is normal in caliber. No dissection. The branch vessels are patent. The major venous structures are patent. No mesenteric or retroperitoneal mass or adenopathy. Small scattered lymph nodes are noted. Reproductive: The uterus and ovaries are unremarkable. Other: No pelvic mass or adenopathy. No free pelvic fluid collections. No inguinal mass or adenopathy. No abdominal wall hernia or subcutaneous lesions. Musculoskeletal: Diffuse mixed lytic and sclerotic osseous metastatic disease involving the spine and pelvis. No pathologic fracture or spinal canal compromise. IMPRESSION: 1. Resolution of diffuse airspace process seen in the left lung on the prior CT scan. There are postinflammatory or postinfectious scarring changes. 2. No worrisome pulmonary lesions to suggest pulmonary metastatic disease. No mediastinal or hilar adenopathy. 3. Stable surgical changes from a left mastectomy. Postoperative fluid collection in the left chest wall but no findings for recurrent tumor. 4. Much improved CT appearance of the liver. Scattered small residual lesions are noted. 5. Stable mixed lytic and sclerotic osseous metastatic disease  without new/progressive findings or pathologic fracture. Electronically Signed   By: Marijo Sanes M.D.   On: 08/06/2018 11:00   Ct Abdomen Pelvis W Contrast  Result Date: 08/06/2018 CLINICAL DATA:  Metastatic breast cancer. EXAM: CT CHEST, ABDOMEN, AND PELVIS WITH CONTRAST TECHNIQUE: Multidetector CT imaging of the chest, abdomen and pelvis was performed following the standard protocol during bolus administration of intravenous contrast. CONTRAST:  144m OMNIPAQUE IOHEXOL 300 MG/ML  SOLN COMPARISON:  05/20/2018 FINDINGS: CT CHEST FINDINGS Cardiovascular: The heart is normal in size. No pericardial effusion. The aorta is normal in caliber. No dissection. Stable coronary artery calcifications. The right-sided Port-A-Cath is in good position. Mediastinum/Nodes: No mediastinal or hilar mass or lymphadenopathy. The esophagus is grossly normal. Small hiatal hernia noted. Lungs/Pleura: Improved aeration of the left lung. There is moderate postinflammatory or postinfectious scarring type changes with interstitial thickening and linear fibrosis. Associated mild loss of volume in the left hemithorax. Stable underlying emphysematous changes. No worrisome pulmonary nodules to suggest pulmonary metastatic disease. No pleural effusions or pleural lesions. Musculoskeletal: Stable surgical changes from a left mastectomy. Stable fluid collection in the left chest wall. No obvious recurrent mass and no supraclavicular or axillary adenopathy. Stable diffuse lytic and sclerotic osseous metastatic disease involving the spine, sternum and ribs. No pathologic fracture or spinal canal compromise. CT ABDOMEN PELVIS FINDINGS Hepatobiliary: Much improved CT appearance of the liver. Prior study showed diffuse confluent hepatic metastatic disease. Now there are just small scattered low-attenuation lesions in both lobes. The largest lesion is in segment 5 and measures 2.3 cm. This is stable. All the other lesions measure 10 cm or less. The  gallbladder is unremarkable.  No common bile duct dilatation. Pancreas: No mass, inflammation or ductal dilatation. Spleen: Normal size. Stable enhancing lesion with overlying capsular retraction, likely benign hemangioma. Adrenals/Urinary Tract: The adrenal glands and kidneys are unremarkable and stable. Left-sided pararenal cysts are noted. Stomach/Bowel: The stomach, duodenum, small bowel and colon are unremarkable. No acute inflammatory changes, mass lesions or obstructive findings.  The terminal ileum is normal. Large amount of stool in the rectum may suggest fecal impaction. Vascular/Lymphatic: The aorta is normal in caliber. No dissection. The branch vessels are patent. The major venous structures are patent. No mesenteric or retroperitoneal mass or adenopathy. Small scattered lymph nodes are noted. Reproductive: The uterus and ovaries are unremarkable. Other: No pelvic mass or adenopathy. No free pelvic fluid collections. No inguinal mass or adenopathy. No abdominal wall hernia or subcutaneous lesions. Musculoskeletal: Diffuse mixed lytic and sclerotic osseous metastatic disease involving the spine and pelvis. No pathologic fracture or spinal canal compromise. IMPRESSION: 1. Resolution of diffuse airspace process seen in the left lung on the prior CT scan. There are postinflammatory or postinfectious scarring changes. 2. No worrisome pulmonary lesions to suggest pulmonary metastatic disease. No mediastinal or hilar adenopathy. 3. Stable surgical changes from a left mastectomy. Postoperative fluid collection in the left chest wall but no findings for recurrent tumor. 4. Much improved CT appearance of the liver. Scattered small residual lesions are noted. 5. Stable mixed lytic and sclerotic osseous metastatic disease without new/progressive findings or pathologic fracture. Electronically Signed   By: Marijo Sanes M.D.   On: 08/06/2018 11:00    PERFORMANCE STATUS (ECOG) : 3  Review of Systems As noted  above. Otherwise, a complete review of systems is negative.  Physical Exam General: NAD, frail appearing Pulmonary: unlabored Extremities: trace edema Skin: no rashes Neurological: Weakness but otherwise nonfocal  IMPRESSION: Follow-up visit today.  Patient also saw Dr. Grayland Ormond.  Results of CT of the chest and abdomen were reviewed.  Evidence of disease improvement were seen on imaging.  Options for continued treatment versus a transition to comfort measures were discussed with patient and patient wants to continue systemic therapy.  Would anticipate future use of hospice in the setting of decline or disease progression.  I spoke with Natalia Leatherwood, NP with [facility].    PLAN: -Continue current scope of treatment -Continue oxycodone prn for pain -RTC in 2 weeks   Patient expressed understanding and was in agreement with this plan. She also understands that She can call clinic at any time with any questions, concerns, or complaints.    Time Total: 15 minutes  Visit consisted of counseling and education dealing with the complex and emotionally intense issues of symptom management and palliative care in the setting of serious and potentially life-threatening illness.Greater than 50%  of this time was spent counseling and coordinating care related to the above assessment and plan.  Signed by: Altha Harm, PhD, NP-C [phone] (Work Cell)

## 2018-08-06 NOTE — Progress Notes (Signed)
Patient stated that she had not been feeling well. Patient also stated that she had been constipated. Patient denied fever, chills, nausea, vomiting or diarrhea.

## 2018-08-08 ENCOUNTER — Inpatient Hospital Stay: Payer: Medicare Other

## 2018-08-08 ENCOUNTER — Other Ambulatory Visit: Payer: Self-pay

## 2018-08-08 VITALS — BP 130/85 | HR 99 | Temp 97.0°F | Resp 18

## 2018-08-08 DIAGNOSIS — D61818 Other pancytopenia: Secondary | ICD-10-CM | POA: Diagnosis not present

## 2018-08-08 DIAGNOSIS — G4700 Insomnia, unspecified: Secondary | ICD-10-CM | POA: Diagnosis not present

## 2018-08-08 DIAGNOSIS — C787 Secondary malignant neoplasm of liver and intrahepatic bile duct: Secondary | ICD-10-CM | POA: Diagnosis not present

## 2018-08-08 DIAGNOSIS — D649 Anemia, unspecified: Secondary | ICD-10-CM | POA: Diagnosis not present

## 2018-08-08 DIAGNOSIS — C7951 Secondary malignant neoplasm of bone: Secondary | ICD-10-CM | POA: Diagnosis not present

## 2018-08-08 DIAGNOSIS — C50919 Malignant neoplasm of unspecified site of unspecified female breast: Secondary | ICD-10-CM | POA: Diagnosis not present

## 2018-08-08 MED ORDER — SODIUM CHLORIDE 0.9 % IV SOLN
1400.0000 mg | Freq: Once | INTRAVENOUS | Status: AC
  Administered 2018-08-08: 12:00:00 1400 mg via INTRAVENOUS
  Filled 2018-08-08: qty 26.3

## 2018-08-08 MED ORDER — PROCHLORPERAZINE MALEATE 10 MG PO TABS
10.0000 mg | ORAL_TABLET | Freq: Once | ORAL | Status: AC
  Administered 2018-08-08: 10 mg via ORAL
  Filled 2018-08-08: qty 1

## 2018-08-08 MED ORDER — SODIUM CHLORIDE 0.9 % IV SOLN
Freq: Once | INTRAVENOUS | Status: AC
  Administered 2018-08-08: 11:00:00 via INTRAVENOUS
  Filled 2018-08-08: qty 250

## 2018-08-08 MED ORDER — HEPARIN SOD (PORK) LOCK FLUSH 100 UNIT/ML IV SOLN
500.0000 [IU] | Freq: Once | INTRAVENOUS | Status: AC | PRN
  Administered 2018-08-08: 500 [IU]
  Filled 2018-08-08: qty 5

## 2018-08-09 DIAGNOSIS — C50912 Malignant neoplasm of unspecified site of left female breast: Secondary | ICD-10-CM | POA: Diagnosis not present

## 2018-08-09 DIAGNOSIS — R1310 Dysphagia, unspecified: Secondary | ICD-10-CM | POA: Diagnosis not present

## 2018-08-09 DIAGNOSIS — C7951 Secondary malignant neoplasm of bone: Secondary | ICD-10-CM | POA: Diagnosis not present

## 2018-08-09 DIAGNOSIS — E1142 Type 2 diabetes mellitus with diabetic polyneuropathy: Secondary | ICD-10-CM | POA: Diagnosis not present

## 2018-08-09 DIAGNOSIS — C787 Secondary malignant neoplasm of liver and intrahepatic bile duct: Secondary | ICD-10-CM | POA: Diagnosis not present

## 2018-08-09 DIAGNOSIS — D630 Anemia in neoplastic disease: Secondary | ICD-10-CM | POA: Diagnosis not present

## 2018-08-12 DIAGNOSIS — Z170 Estrogen receptor positive status [ER+]: Secondary | ICD-10-CM | POA: Diagnosis not present

## 2018-08-12 DIAGNOSIS — R1310 Dysphagia, unspecified: Secondary | ICD-10-CM | POA: Diagnosis not present

## 2018-08-12 DIAGNOSIS — M6281 Muscle weakness (generalized): Secondary | ICD-10-CM | POA: Diagnosis not present

## 2018-08-12 DIAGNOSIS — K219 Gastro-esophageal reflux disease without esophagitis: Secondary | ICD-10-CM | POA: Diagnosis not present

## 2018-08-12 DIAGNOSIS — C787 Secondary malignant neoplasm of liver and intrahepatic bile duct: Secondary | ICD-10-CM | POA: Diagnosis not present

## 2018-08-12 DIAGNOSIS — M170 Bilateral primary osteoarthritis of knee: Secondary | ICD-10-CM | POA: Diagnosis not present

## 2018-08-12 DIAGNOSIS — E785 Hyperlipidemia, unspecified: Secondary | ICD-10-CM | POA: Diagnosis not present

## 2018-08-12 DIAGNOSIS — F419 Anxiety disorder, unspecified: Secondary | ICD-10-CM | POA: Diagnosis not present

## 2018-08-12 DIAGNOSIS — R413 Other amnesia: Secondary | ICD-10-CM | POA: Diagnosis not present

## 2018-08-12 DIAGNOSIS — D630 Anemia in neoplastic disease: Secondary | ICD-10-CM | POA: Diagnosis not present

## 2018-08-12 DIAGNOSIS — Z9012 Acquired absence of left breast and nipple: Secondary | ICD-10-CM | POA: Diagnosis not present

## 2018-08-12 DIAGNOSIS — Z7984 Long term (current) use of oral hypoglycemic drugs: Secondary | ICD-10-CM | POA: Diagnosis not present

## 2018-08-12 DIAGNOSIS — I10 Essential (primary) hypertension: Secondary | ICD-10-CM | POA: Diagnosis not present

## 2018-08-12 DIAGNOSIS — C50912 Malignant neoplasm of unspecified site of left female breast: Secondary | ICD-10-CM | POA: Diagnosis not present

## 2018-08-12 DIAGNOSIS — G4700 Insomnia, unspecified: Secondary | ICD-10-CM | POA: Diagnosis not present

## 2018-08-12 DIAGNOSIS — C7951 Secondary malignant neoplasm of bone: Secondary | ICD-10-CM | POA: Diagnosis not present

## 2018-08-12 DIAGNOSIS — R630 Anorexia: Secondary | ICD-10-CM | POA: Diagnosis not present

## 2018-08-12 DIAGNOSIS — E1142 Type 2 diabetes mellitus with diabetic polyneuropathy: Secondary | ICD-10-CM | POA: Diagnosis not present

## 2018-08-12 DIAGNOSIS — F329 Major depressive disorder, single episode, unspecified: Secondary | ICD-10-CM | POA: Diagnosis not present

## 2018-08-13 ENCOUNTER — Inpatient Hospital Stay: Payer: Medicare Other

## 2018-08-13 ENCOUNTER — Other Ambulatory Visit: Payer: Self-pay

## 2018-08-13 DIAGNOSIS — D61818 Other pancytopenia: Secondary | ICD-10-CM | POA: Diagnosis not present

## 2018-08-13 DIAGNOSIS — C50919 Malignant neoplasm of unspecified site of unspecified female breast: Secondary | ICD-10-CM | POA: Diagnosis not present

## 2018-08-13 DIAGNOSIS — C787 Secondary malignant neoplasm of liver and intrahepatic bile duct: Secondary | ICD-10-CM | POA: Diagnosis not present

## 2018-08-13 DIAGNOSIS — D649 Anemia, unspecified: Secondary | ICD-10-CM | POA: Diagnosis not present

## 2018-08-13 DIAGNOSIS — C7951 Secondary malignant neoplasm of bone: Secondary | ICD-10-CM | POA: Diagnosis not present

## 2018-08-13 DIAGNOSIS — G4700 Insomnia, unspecified: Secondary | ICD-10-CM | POA: Diagnosis not present

## 2018-08-13 LAB — CBC WITH DIFFERENTIAL/PLATELET
Abs Immature Granulocytes: 0.04 10*3/uL (ref 0.00–0.07)
Basophils Absolute: 0 10*3/uL (ref 0.0–0.1)
Basophils Relative: 0 %
Eosinophils Absolute: 0 10*3/uL (ref 0.0–0.5)
Eosinophils Relative: 0 %
HCT: 31.6 % — ABNORMAL LOW (ref 36.0–46.0)
Hemoglobin: 9.3 g/dL — ABNORMAL LOW (ref 12.0–15.0)
Immature Granulocytes: 1 %
Lymphocytes Relative: 19 %
Lymphs Abs: 0.9 10*3/uL (ref 0.7–4.0)
MCH: 26.6 pg (ref 26.0–34.0)
MCHC: 29.4 g/dL — ABNORMAL LOW (ref 30.0–36.0)
MCV: 90.5 fL (ref 80.0–100.0)
Monocytes Absolute: 0.5 10*3/uL (ref 0.1–1.0)
Monocytes Relative: 11 %
Neutro Abs: 3.1 10*3/uL (ref 1.7–7.7)
Neutrophils Relative %: 69 %
Platelets: 335 10*3/uL (ref 150–400)
RBC: 3.49 MIL/uL — ABNORMAL LOW (ref 3.87–5.11)
RDW: 17.3 % — ABNORMAL HIGH (ref 11.5–15.5)
WBC: 4.5 10*3/uL (ref 4.0–10.5)
nRBC: 0.7 % — ABNORMAL HIGH (ref 0.0–0.2)

## 2018-08-13 LAB — COMPREHENSIVE METABOLIC PANEL
ALT: 60 U/L — ABNORMAL HIGH (ref 0–44)
AST: 94 U/L — ABNORMAL HIGH (ref 15–41)
Albumin: 3.2 g/dL — ABNORMAL LOW (ref 3.5–5.0)
Alkaline Phosphatase: 157 U/L — ABNORMAL HIGH (ref 38–126)
Anion gap: 11 (ref 5–15)
BUN: 20 mg/dL (ref 8–23)
CO2: 24 mmol/L (ref 22–32)
Calcium: 8.5 mg/dL — ABNORMAL LOW (ref 8.9–10.3)
Chloride: 103 mmol/L (ref 98–111)
Creatinine, Ser: 0.78 mg/dL (ref 0.44–1.00)
GFR calc Af Amer: >60 mL/min (ref >60)
GFR calc non Af Amer: >60 mL/min (ref >60)
Glucose, Bld: 235 mg/dL — ABNORMAL HIGH (ref 70–99)
Potassium: 3.9 mmol/L (ref 3.5–5.1)
Sodium: 138 mmol/L (ref 135–145)
Total Bilirubin: 0.8 mg/dL (ref 0.3–1.2)
Total Protein: 6.2 g/dL — ABNORMAL LOW (ref 6.5–8.1)

## 2018-08-13 LAB — SAMPLE TO BLOOD BANK

## 2018-08-13 MED ORDER — SODIUM CHLORIDE 0.9% FLUSH
10.0000 mL | INTRAVENOUS | Status: DC | PRN
  Administered 2018-08-13: 10 mL via INTRAVENOUS
  Filled 2018-08-13: qty 10

## 2018-08-13 MED ORDER — HEPARIN SOD (PORK) LOCK FLUSH 100 UNIT/ML IV SOLN
500.0000 [IU] | Freq: Once | INTRAVENOUS | Status: AC
  Administered 2018-08-13: 500 [IU] via INTRAVENOUS
  Filled 2018-08-13: qty 5

## 2018-08-13 NOTE — Progress Notes (Signed)
Hemoglobin: 9.3. MD, Dr. Grayland Ormond, notified. Per MD order: patient does not need pRBC transfusion today. Patient may be discharged to home at this time.

## 2018-08-14 DIAGNOSIS — R1310 Dysphagia, unspecified: Secondary | ICD-10-CM | POA: Diagnosis not present

## 2018-08-14 DIAGNOSIS — D630 Anemia in neoplastic disease: Secondary | ICD-10-CM | POA: Diagnosis not present

## 2018-08-14 DIAGNOSIS — E1142 Type 2 diabetes mellitus with diabetic polyneuropathy: Secondary | ICD-10-CM | POA: Diagnosis not present

## 2018-08-14 DIAGNOSIS — C7951 Secondary malignant neoplasm of bone: Secondary | ICD-10-CM | POA: Diagnosis not present

## 2018-08-14 DIAGNOSIS — C50912 Malignant neoplasm of unspecified site of left female breast: Secondary | ICD-10-CM | POA: Diagnosis not present

## 2018-08-14 DIAGNOSIS — C787 Secondary malignant neoplasm of liver and intrahepatic bile duct: Secondary | ICD-10-CM | POA: Diagnosis not present

## 2018-08-15 ENCOUNTER — Other Ambulatory Visit: Payer: Self-pay

## 2018-08-15 ENCOUNTER — Encounter: Payer: Self-pay | Admitting: Nurse Practitioner

## 2018-08-15 ENCOUNTER — Other Ambulatory Visit: Payer: Medicare Other | Admitting: Nurse Practitioner

## 2018-08-15 DIAGNOSIS — C50912 Malignant neoplasm of unspecified site of left female breast: Secondary | ICD-10-CM | POA: Diagnosis not present

## 2018-08-15 DIAGNOSIS — R630 Anorexia: Secondary | ICD-10-CM

## 2018-08-15 DIAGNOSIS — Z515 Encounter for palliative care: Secondary | ICD-10-CM | POA: Diagnosis not present

## 2018-08-15 DIAGNOSIS — R531 Weakness: Secondary | ICD-10-CM | POA: Diagnosis not present

## 2018-08-15 DIAGNOSIS — D630 Anemia in neoplastic disease: Secondary | ICD-10-CM | POA: Diagnosis not present

## 2018-08-15 DIAGNOSIS — E1142 Type 2 diabetes mellitus with diabetic polyneuropathy: Secondary | ICD-10-CM | POA: Diagnosis not present

## 2018-08-15 DIAGNOSIS — C787 Secondary malignant neoplasm of liver and intrahepatic bile duct: Secondary | ICD-10-CM | POA: Diagnosis not present

## 2018-08-15 DIAGNOSIS — C7951 Secondary malignant neoplasm of bone: Secondary | ICD-10-CM | POA: Diagnosis not present

## 2018-08-15 DIAGNOSIS — R1310 Dysphagia, unspecified: Secondary | ICD-10-CM | POA: Diagnosis not present
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
## 2018-08-17 DIAGNOSIS — C50912 Malignant neoplasm of unspecified site of left female breast: Secondary | ICD-10-CM | POA: Diagnosis not present

## 2018-08-17 DIAGNOSIS — C787 Secondary malignant neoplasm of liver and intrahepatic bile duct: Secondary | ICD-10-CM | POA: Diagnosis not present

## 2018-08-17 DIAGNOSIS — C7951 Secondary malignant neoplasm of bone: Secondary | ICD-10-CM | POA: Diagnosis not present

## 2018-08-17 DIAGNOSIS — D630 Anemia in neoplastic disease: Secondary | ICD-10-CM | POA: Diagnosis not present

## 2018-08-17 DIAGNOSIS — R1310 Dysphagia, unspecified: Secondary | ICD-10-CM | POA: Diagnosis not present

## 2018-08-17 DIAGNOSIS — E1142 Type 2 diabetes mellitus with diabetic polyneuropathy: Secondary | ICD-10-CM | POA: Diagnosis not present

## 2018-08-18 NOTE — Progress Notes (Signed)
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  INTERVAL HISTORY: Patient returns to clinic today for further evaluation and consideration of her next treatment of gemcitabine.  Although she continues to have a poor performance status, she is more alert today and her appetite has significantly improved.  She continues to have chronic weakness and fatigue, but she feels this has improved as well.  Her chronic peripheral neuropathy is unchanged.  She has no other neurologic complaints.  She denies any recent fevers or illnesses.  She denies any chest pain, shortness of breath or hemoptysis.  She continues to have occasional cough, particularly with eating.  She denies any nausea, vomiting, constipation, or diarrhea. She has no urinary complaints.  Patient offers no further specific complaints today.  REVIEW OF SYSTEMS:   Review of Systems  Constitutional: Positive for malaise/fatigue. Negative for fever and weight loss.  Eyes: Negative.  Negative for blurred vision, double vision and pain.  Respiratory: Negative.  Negative for cough and shortness of breath.   Cardiovascular: Negative.  Negative for chest pain and leg swelling.  Gastrointestinal: Negative.  Negative for abdominal pain.  Genitourinary: Negative.  Negative for dysuria and flank pain.  Musculoskeletal: Negative for back pain and falls.  Skin: Negative.  Negative for rash.  Neurological: Positive for tingling, sensory change and weakness. Negative for focal weakness and headaches.  Endo/Heme/Allergies: Negative.   Psychiatric/Behavioral: Negative.  Negative for memory loss. The patient is not  nervous/anxious and does not have insomnia.     As per HPI. Otherwise, a complete review of systems is negative.  PAST MEDICAL HISTORY: Past Medical History:  Diagnosis Date   Anxiety    Back pain    occasionally   Breast cancer ([place]) 2009   left   Depression    takes Paxil and Wellbutrin daily   Diabetes ([place])    takes Metformin daily   type 2    Genetic testing 02/03/2017   Multi-Cancer panel (83 genes) @ [facility] - No pathogenic mutations detected   GERD (gastroesophageal reflux disease)    occasional   History of bronchitis 2 yrs ago   History of kidney stones    Hyperlipidemia    takes Atorvastatin daily   Hypertension    no meds   Insomnia    takes Ambien nightly   Insomnia    takes gabapentin nightly   Joint pain    Mood swings    Osteoarthritis of knee    Personal history of chemotherapy 12/05/2016   Mets from Breast Cancer   Personal history of radiation therapy 11/2016   Pneumonia    PONV (postoperative nausea and vomiting)    Seasonal allergies    takes Allegra daily    PAST SURGICAL HISTORY: Past Surgical History:  Procedure Laterality Date   BREAST BIOPSY  2009   cataract surgery Bilateral    COLONOSCOPY     IR RADIOLOGIST EVAL & MGMT  01/29/2018   JOINT REPLACEMENT Left 2014   knee   KNEE ARTHROSCOPY Left    x 5   MASTECTOMY Left    port a cath placed     PORTACATH PLACEMENT N/A 09/17/2015   Procedure: INSERTION PORT-A-CATH;  Surgeon:  Diego Sarita Haver, MD;  Location: [facility];  Service: General;  Laterality: N/A;   RADIOLOGY WITH ANESTHESIA N/A 02/20/2018   Procedure: CT WITH ANESTHESIA MICROWAVE THERMAL ABLATION-LIVER;  Surgeon: Jacqulynn Cadet, MD;  Location: [facility];  Service: Anesthesiology;  Laterality: N/A;   TOOTH EXTRACTION     TOTAL SHOULDER ARTHROPLASTY Right 06/03/2015   Procedure: TOTAL SHOULDER ARTHROPLASTY;  Surgeon: Tania Ade, MD;  Location: [place];  Service: Orthopedics;  Laterality: Right;  Right  total shoulder arthroplasty   TOTAL SHOULDER REPLACEMENT Right 06/03/2015   TUBAL LIGATION      FAMILY HISTORY: Father with non-Hodgkin's lymphoma, 2 paternal aunts with breast cancer.     ADVANCED DIRECTIVES:    HEALTH MAINTENANCE: Social History   Tobacco Use   Smoking status: Never Smoker   Smokeless tobacco: Never Used  Substance Use Topics   Alcohol use: Yes    Alcohol/week: 0.0 standard drinks    Comment: occasionally wine   Drug use: Yes    Types: Marijuana    Comment: cannabis with no extra  low dose edibles  for neuropathy     Colonoscopy:  PAP:  Bone density:  Lipid panel:  Allergies  Allergen Reactions   Ace Inhibitors Cough   Latex Itching   Morphine And Related Itching    Caused her to itch terribly. Would prefer if given to take with a benadryl   Penicillins Rash    Has patient had a PCN reaction causing immediate rash, facial/tongue/throat swelling, SOB or lightheadedness with hypotension: No Has patient had a PCN reaction causing severe rash involving mucus membranes or skin necrosis: No Has patient had a PCN reaction that required hospitalization No Has patient had a PCN reaction occurring within the last 10 years: No If all of the above answers are "NO", then may proceed with Cephalosporin use.    Current Outpatient Medications  Medication Sig Dispense Refill   acetaminophen (TYLENOL) 500 MG tablet Take 500 mg by mouth every 6 (six) hours as needed for mild pain or moderate pain.      atorvastatin (LIPITOR) 10 MG tablet Take 10 mg by mouth at bedtime.      buPROPion (WELLBUTRIN XL) 150 MG 24 hr tablet Take 150 mg by mouth at bedtime.      Calcium-Magnesium-Vitamin D (CALCIUM 1200+D3 PO) Take 1 tablet by mouth daily.      Cyanocobalamin 5000 MCG CAPS Take 5,000 mcg by mouth daily.      fexofenadine (ALLEGRA) 180 MG tablet Take 180 mg by mouth at bedtime.      gabapentin (NEURONTIN) 300 MG capsule Take 300-600 mg by mouth 2 (two)  times daily. Take 300 mg by mouth in the morning and 335m at night     lidocaine-prilocaine (EMLA) cream Apply 1 application topically as needed. Apply to port 1-2 hours prior to chemotherapy appointment. Cover with plastic wrap. 30 g 0   metFORMIN (GLUCOPHAGE) 850 MG tablet Take 850 mg by mouth 2 (two) times daily with a meal.      Oxycodone HCl 10 MG TABS Take 1 tablet (10 mg total) by mouth every 4 (four) hours as needed. Ok to take every 4-6 hours as needed. 90 tablet 0   pantoprazole (PROTONIX) 40 MG tablet Take 40 mg by mouth at bedtime.      PARoxetine (PAXIL) 40 MG tablet Take 40 mg by mouth at bedtime.      prochlorperazine (COMPAZINE) 10 MG tablet TAKE 1 TABLET BY MOUTH EVERY 6 HOURS  AS NEEDED FOR  NAUSEA OR  VOMITING 120 tablet 1   sodium chloride (MURO 128) 5 % ophthalmic solution Place 1 drop into both eyes 4 (four) times daily.     zolpidem (AMBIEN) 10 MG tablet Take 1 tablet (10 mg total) by mouth at bedtime as needed (sleep). 90 tablet 0   No current facility-administered medications for this visit.     OBJECTIVE: Vitals:   08/20/18 0948  BP: 133/85  Pulse: (!) 122  Temp: (!) 96.6 F (35.9 C)     Body mass index is 23.86 kg/m.    ECOG FS:2 - Symptomatic, <50% confined to bed  General: Thin, no acute distress.  Sitting in a wheelchair. Eyes: Pink conjunctiva, anicteric sclera. HEENT: Normocephalic, moist mucous membranes, clear oropharnyx. Lungs: Clear to auscultation bilaterally. Heart: Regular rate and rhythm. No rubs, murmurs, or gallops. Abdomen: Soft, nontender, nondistended. No organomegaly noted, normoactive bowel sounds. Musculoskeletal: No edema, cyanosis, or clubbing. Neuro: Alert, answering all questions appropriately. Cranial nerves grossly intact. Skin: No rashes or petechiae noted. Psych: Normal affect.  LAB RESULTS:  Lab Results  Component Value Date   NA 138 08/20/2018   K 4.2 08/20/2018   CL 106 08/20/2018   CO2 25 08/20/2018    GLUCOSE 225 (H) 08/20/2018   BUN 22 08/20/2018   CREATININE 0.69 08/20/2018   CALCIUM 8.3 (L) 08/20/2018   PROT 6.2 (L) 08/20/2018   ALBUMIN 3.3 (L) 08/20/2018   AST 64 (H) 08/20/2018   ALT 40 08/20/2018   ALKPHOS 145 (H) 08/20/2018   BILITOT 0.5 08/20/2018   GFRNONAA >60 08/20/2018   GFRAA >60 08/20/2018    Lab Results  Component Value Date   WBC 4.8 08/20/2018   NEUTROABS 2.8 08/20/2018   HGB 9.6 (L) 08/20/2018   HCT 33.4 (L) 08/20/2018   MCV 91.0 08/20/2018   PLT 341 08/20/2018     STUDIES: Ct Chest W Contrast  Result Date: 08/06/2018 CLINICAL DATA:  Metastatic breast cancer. EXAM: CT CHEST, ABDOMEN, AND PELVIS WITH CONTRAST TECHNIQUE: Multidetector CT imaging of the chest, abdomen and pelvis was performed following the standard protocol during bolus administration of intravenous contrast. CONTRAST:  122m OMNIPAQUE IOHEXOL 300 MG/ML  SOLN COMPARISON:  05/20/2018 FINDINGS: CT CHEST FINDINGS Cardiovascular: The heart is normal in size. No pericardial effusion. The aorta is normal in caliber. No dissection. Stable coronary artery calcifications. The right-sided Port-A-Cath is in good position. Mediastinum/Nodes: No mediastinal or hilar mass or lymphadenopathy. The esophagus is grossly normal. Small hiatal hernia noted. Lungs/Pleura: Improved aeration of the left lung. There is moderate postinflammatory or postinfectious scarring type changes with interstitial thickening and linear fibrosis. Associated mild loss of volume in the left hemithorax. Stable underlying emphysematous changes. No worrisome pulmonary nodules to suggest pulmonary metastatic disease. No pleural effusions or pleural lesions. Musculoskeletal: Stable surgical changes from a left mastectomy. Stable fluid collection in the left chest wall. No obvious recurrent mass and no supraclavicular or axillary adenopathy. Stable diffuse lytic and sclerotic osseous metastatic disease involving the spine, sternum and ribs. No  pathologic fracture or spinal canal compromise. CT ABDOMEN PELVIS FINDINGS Hepatobiliary: Much improved CT appearance of the liver. Prior study showed diffuse confluent hepatic metastatic disease. Now there are just small scattered low-attenuation lesions in both lobes. The largest lesion is in segment 5 and measures 2.3 cm. This is stable. All the other lesions measure 10 cm or less. The gallbladder is unremarkable.  No common bile duct dilatation. Pancreas: No mass, inflammation or ductal  dilatation. Spleen: Normal size. Stable enhancing lesion with overlying capsular retraction, likely benign hemangioma. Adrenals/Urinary Tract: The adrenal glands and kidneys are unremarkable and stable. Left-sided pararenal cysts are noted. Stomach/Bowel: The stomach, duodenum, small bowel and colon are unremarkable. No acute inflammatory changes, mass lesions or obstructive findings. The terminal ileum is normal. Large amount of stool in the rectum may suggest fecal impaction. Vascular/Lymphatic: The aorta is normal in caliber. No dissection. The branch vessels are patent. The major venous structures are patent. No mesenteric or retroperitoneal mass or adenopathy. Small scattered lymph nodes are noted. Reproductive: The uterus and ovaries are unremarkable. Other: No pelvic mass or adenopathy. No free pelvic fluid collections. No inguinal mass or adenopathy. No abdominal wall hernia or subcutaneous lesions. Musculoskeletal: Diffuse mixed lytic and sclerotic osseous metastatic disease involving the spine and pelvis. No pathologic fracture or spinal canal compromise. IMPRESSION: 1. Resolution of diffuse airspace process seen in the left lung on the prior CT scan. There are postinflammatory or postinfectious scarring changes. 2. No worrisome pulmonary lesions to suggest pulmonary metastatic disease. No mediastinal or hilar adenopathy. 3. Stable surgical changes from a left mastectomy. Postoperative fluid collection in the left chest  wall but no findings for recurrent tumor. 4. Much improved CT appearance of the liver. Scattered small residual lesions are noted. 5. Stable mixed lytic and sclerotic osseous metastatic disease without new/progressive findings or pathologic fracture. Electronically Signed   By: Marijo Sanes M.D.   On: 08/06/2018 11:00   Ct Abdomen Pelvis W Contrast  Result Date: 08/06/2018 CLINICAL DATA:  Metastatic breast cancer. EXAM: CT CHEST, ABDOMEN, AND PELVIS WITH CONTRAST TECHNIQUE: Multidetector CT imaging of the chest, abdomen and pelvis was performed following the standard protocol during bolus administration of intravenous contrast. CONTRAST:  163m OMNIPAQUE IOHEXOL 300 MG/ML  SOLN COMPARISON:  05/20/2018 FINDINGS: CT CHEST FINDINGS Cardiovascular: The heart is normal in size. No pericardial effusion. The aorta is normal in caliber. No dissection. Stable coronary artery calcifications. The right-sided Port-A-Cath is in good position. Mediastinum/Nodes: No mediastinal or hilar mass or lymphadenopathy. The esophagus is grossly normal. Small hiatal hernia noted. Lungs/Pleura: Improved aeration of the left lung. There is moderate postinflammatory or postinfectious scarring type changes with interstitial thickening and linear fibrosis. Associated mild loss of volume in the left hemithorax. Stable underlying emphysematous changes. No worrisome pulmonary nodules to suggest pulmonary metastatic disease. No pleural effusions or pleural lesions. Musculoskeletal: Stable surgical changes from a left mastectomy. Stable fluid collection in the left chest wall. No obvious recurrent mass and no supraclavicular or axillary adenopathy. Stable diffuse lytic and sclerotic osseous metastatic disease involving the spine, sternum and ribs. No pathologic fracture or spinal canal compromise. CT ABDOMEN PELVIS FINDINGS Hepatobiliary: Much improved CT appearance of the liver. Prior study showed diffuse confluent hepatic metastatic disease.  Now there are just small scattered low-attenuation lesions in both lobes. The largest lesion is in segment 5 and measures 2.3 cm. This is stable. All the other lesions measure 10 cm or less. The gallbladder is unremarkable.  No common bile duct dilatation. Pancreas: No mass, inflammation or ductal dilatation. Spleen: Normal size. Stable enhancing lesion with overlying capsular retraction, likely benign hemangioma. Adrenals/Urinary Tract: The adrenal glands and kidneys are unremarkable and stable. Left-sided pararenal cysts are noted. Stomach/Bowel: The stomach, duodenum, small bowel and colon are unremarkable. No acute inflammatory changes, mass lesions or obstructive findings. The terminal ileum is normal. Large amount of stool in the rectum may suggest fecal impaction. Vascular/Lymphatic: The aorta is  normal in caliber. No dissection. The branch vessels are patent. The major venous structures are patent. No mesenteric or retroperitoneal mass or adenopathy. Small scattered lymph nodes are noted. Reproductive: The uterus and ovaries are unremarkable. Other: No pelvic mass or adenopathy. No free pelvic fluid collections. No inguinal mass or adenopathy. No abdominal wall hernia or subcutaneous lesions. Musculoskeletal: Diffuse mixed lytic and sclerotic osseous metastatic disease involving the spine and pelvis. No pathologic fracture or spinal canal compromise. IMPRESSION: 1. Resolution of diffuse airspace process seen in the left lung on the prior CT scan. There are postinflammatory or postinfectious scarring changes. 2. No worrisome pulmonary lesions to suggest pulmonary metastatic disease. No mediastinal or hilar adenopathy. 3. Stable surgical changes from a left mastectomy. Postoperative fluid collection in the left chest wall but no findings for recurrent tumor. 4. Much improved CT appearance of the liver. Scattered small residual lesions are noted. 5. Stable mixed lytic and sclerotic osseous metastatic disease  without new/progressive findings or pathologic fracture. Electronically Signed   By: Marijo Sanes M.D.   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  PLAN:    1.  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  There is no other obvious sites of disease. MRI of the brain on Jun 03, 2018 did not reveal metastatic disease.  Hospice and end-of-life care have been discussed on multiple occasions, but patient declined and wished to try additional chemotherapy.  She expressed understanding that given her performance status, there is likely no additional options after trying single agent gemcitabine.  Given patient's persistent pancytopenia, she will likely only be able to tolerate treatment every other week.  Proceed with cycle 6 of gemcitabine today.  Clinic in 1 week for laboratory work only and then in 2 weeks for further evaluation and consideration of cycle 7.   2.  Anemia: Patient's hemoglobin is decreased, but stable at 9.6.  She does not require transfusion. 3.  Peripheral neuropathy: Chronic and unchanged.   4.  Back pain: MRI results from May 04, 2017 did not reveal metastatic disease.  Continue symptomatic treatment.  5.  Left IJ clot: Ultrasound results reviewed independently.  Patient has been instructed to discontinue Eliquis. 6.  Neutropenia: Resolved. 7.  Memory complaints/confusion: Repeat MRI of the brain on Jun 03, 2018 reviewed independently with no obvious intracranial abnormality.  Patient continues to have progressive and persistent osseous metastatic disease. 8.  Elevated liver enzymes: Nearly resolved.  Patient only has a mildly elevated AST.  Gemcitabine does not need to be dose reduced in the setting of  liver dysfunction. 9.  Apparent aspiration: Despite a recent normal barium swallow, patient still describes symptoms of significant aspiration with eating.  Patient is now being seen by speech pathology with home health.   10.  Poor appetite: Improved.  Patient has tried Remeron and dexamethasone without much effect.  She only took 2 days of Megace prior to discontinuing.  Patient does not wish to retry Megace at this time.  Appreciate dietary input.   11.  Insomnia: Improved.  Continue Ambien as prescribed.   12.  Bilateral lung opacities: Essentially resolved.  Patient has completed a 14-day course of Levaquin.  Patient expressed understanding and was in agreement with this plan. She  also understands that She can call clinic at any time with any questions, concerns, or complaints.   Breast cancer   Staging form: Breast, AJCC 7th Edition     Pathologic stage from 08/11/2014: Stage IIIA (T0, N2a, cM0) - Signed by Lloyd Huger, MD on 08/11/2014   Lloyd Huger, MD   08/21/2018 7:09 AM

## 2018-08-19 ENCOUNTER — Other Ambulatory Visit: Payer: Self-pay

## 2018-08-20 ENCOUNTER — Inpatient Hospital Stay (HOSPITAL_BASED_OUTPATIENT_CLINIC_OR_DEPARTMENT_OTHER): Payer: Medicare Other | Admitting: Oncology

## 2018-08-20 ENCOUNTER — Encounter: Payer: Self-pay | Admitting: Oncology

## 2018-08-20 ENCOUNTER — Inpatient Hospital Stay: Payer: Medicare Other | Attending: Oncology

## 2018-08-20 ENCOUNTER — Other Ambulatory Visit: Payer: Self-pay

## 2018-08-20 ENCOUNTER — Inpatient Hospital Stay (HOSPITAL_BASED_OUTPATIENT_CLINIC_OR_DEPARTMENT_OTHER): Payer: Medicare Other | Admitting: Nurse Practitioner

## 2018-08-20 ENCOUNTER — Inpatient Hospital Stay: Payer: Medicare Other

## 2018-08-20 VITALS — BP 133/85 | HR 122 | Temp 96.6°F | Ht 64.0 in | Wt 139.0 lb

## 2018-08-20 DIAGNOSIS — C50919 Malignant neoplasm of unspecified site of unspecified female breast: Secondary | ICD-10-CM

## 2018-08-20 DIAGNOSIS — Z95828 Presence of other vascular implants and grafts: Secondary | ICD-10-CM

## 2018-08-20 DIAGNOSIS — C7951 Secondary malignant neoplasm of bone: Secondary | ICD-10-CM | POA: Insufficient documentation

## 2018-08-20 DIAGNOSIS — C787 Secondary malignant neoplasm of liver and intrahepatic bile duct: Secondary | ICD-10-CM | POA: Insufficient documentation

## 2018-08-20 DIAGNOSIS — Z5111 Encounter for antineoplastic chemotherapy: Secondary | ICD-10-CM | POA: Insufficient documentation

## 2018-08-20 DIAGNOSIS — Z515 Encounter for palliative care: Secondary | ICD-10-CM | POA: Diagnosis not present

## 2018-08-20 LAB — CBC WITH DIFFERENTIAL/PLATELET
Abs Immature Granulocytes: 0.03 10*3/uL (ref 0.00–0.07)
Basophils Absolute: 0 10*3/uL (ref 0.0–0.1)
Basophils Relative: 1 %
Eosinophils Absolute: 0 10*3/uL (ref 0.0–0.5)
Eosinophils Relative: 1 %
HCT: 33.4 % — ABNORMAL LOW (ref 36.0–46.0)
Hemoglobin: 9.6 g/dL — ABNORMAL LOW (ref 12.0–15.0)
Immature Granulocytes: 1 %
Lymphocytes Relative: 27 %
Lymphs Abs: 1.3 10*3/uL (ref 0.7–4.0)
MCH: 26.2 pg (ref 26.0–34.0)
MCHC: 28.7 g/dL — ABNORMAL LOW (ref 30.0–36.0)
MCV: 91 fL (ref 80.0–100.0)
Monocytes Absolute: 0.6 10*3/uL (ref 0.1–1.0)
Monocytes Relative: 13 %
Neutro Abs: 2.8 10*3/uL (ref 1.7–7.7)
Neutrophils Relative %: 57 %
Platelets: 341 10*3/uL (ref 150–400)
RBC: 3.67 MIL/uL — ABNORMAL LOW (ref 3.87–5.11)
RDW: 18 % — ABNORMAL HIGH (ref 11.5–15.5)
WBC: 4.8 10*3/uL (ref 4.0–10.5)
nRBC: 0 % (ref 0.0–0.2)

## 2018-08-20 LAB — COMPREHENSIVE METABOLIC PANEL
ALT: 40 U/L (ref 0–44)
AST: 64 U/L — ABNORMAL HIGH (ref 15–41)
Albumin: 3.3 g/dL — ABNORMAL LOW (ref 3.5–5.0)
Alkaline Phosphatase: 145 U/L — ABNORMAL HIGH (ref 38–126)
Anion gap: 7 (ref 5–15)
BUN: 22 mg/dL (ref 8–23)
CO2: 25 mmol/L (ref 22–32)
Calcium: 8.3 mg/dL — ABNORMAL LOW (ref 8.9–10.3)
Chloride: 106 mmol/L (ref 98–111)
Creatinine, Ser: 0.69 mg/dL (ref 0.44–1.00)
GFR calc Af Amer: >60 mL/min (ref >60)
GFR calc non Af Amer: >60 mL/min (ref >60)
Glucose, Bld: 225 mg/dL — ABNORMAL HIGH (ref 70–99)
Potassium: 4.2 mmol/L (ref 3.5–5.1)
Sodium: 138 mmol/L (ref 135–145)
Total Bilirubin: 0.5 mg/dL (ref 0.3–1.2)
Total Protein: 6.2 g/dL — ABNORMAL LOW (ref 6.5–8.1)

## 2018-08-20 MED ORDER — SODIUM CHLORIDE 0.9% FLUSH
10.0000 mL | Freq: Once | INTRAVENOUS | Status: AC
  Administered 2018-08-20: 10 mL via INTRAVENOUS
  Filled 2018-08-20: qty 10

## 2018-08-20 MED ORDER — SODIUM CHLORIDE 0.9 % IV SOLN
1400.0000 mg | Freq: Once | INTRAVENOUS | Status: AC
  Administered 2018-08-20: 12:00:00 1400 mg via INTRAVENOUS
  Filled 2018-08-20: qty 26.3

## 2018-08-20 MED ORDER — PROCHLORPERAZINE MALEATE 10 MG PO TABS
10.0000 mg | ORAL_TABLET | Freq: Once | ORAL | Status: AC
  Administered 2018-08-20: 10 mg via ORAL
  Filled 2018-08-20: qty 1

## 2018-08-20 MED ORDER — HEPARIN SOD (PORK) LOCK FLUSH 100 UNIT/ML IV SOLN
500.0000 [IU] | Freq: Once | INTRAVENOUS | Status: AC
  Administered 2018-08-20: 500 [IU] via INTRAVENOUS

## 2018-08-20 MED ORDER — SODIUM CHLORIDE 0.9 % IV SOLN
Freq: Once | INTRAVENOUS | Status: AC
  Filled 2018-08-20: qty 250

## 2018-08-20 MED ORDER — SODIUM CHLORIDE 0.9 % IV SOLN
Freq: Once | INTRAVENOUS | Status: AC
  Administered 2018-08-20: 11:00:00 via INTRAVENOUS
  Filled 2018-08-20: qty 250

## 2018-08-20 NOTE — Progress Notes (Signed)
Patient stated that she had been doing well with no complaints. 

## 2018-08-20 NOTE — Progress Notes (Signed)
Dr. Grayland Ormond notified HR 122 on first check and 114 on recheck, he said to proceed with treatment today.

## 2018-08-20 NOTE — Progress Notes (Signed)
[place]  Telephone:([phone] [phone]   Name: Amanda [name] Date: 08/20/2018 MRN: [id number]  DOB: 10-May-1950  Patient Care Team: Sofie Hartigan, MD as PCP - General (Family Medicine) Dahlia Byes, Marjory Lies, MD as Consulting Physician (General Surgery)    REASON FOR CONSULTATION: Palliative Care consult requested for this 68 y.o. 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  She was found to have disease progression on Ibrance and letrozole.  Patient previously had verbalized a desire not to pursue further treatment in the event of disease progression.  However, she is decided to start [place].  Palliative care was consulted to help address goals and follow for support.  SOCIAL HISTORY:    Patient lives at home with her husband.  She has a son and stepson who are involved.  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  Her husband owns a "ozone" business.  ADVANCE DIRECTIVES:  Not on file. Has HCPOA and living will at home.   CODE STATUS: DNR  PAST MEDICAL HISTORY: Past Medical History:  Diagnosis Date   Anxiety    Back pain    occasionally   Breast cancer ([place]) 2009   left   Depression    takes Paxil and Wellbutrin daily   Diabetes ([place])    takes Metformin daily   type 2    Genetic testing 02/03/2017   Multi-Cancer panel (83 genes) @ [facility] - No pathogenic mutations detected   GERD (gastroesophageal reflux disease)    occasional   History of bronchitis 2 yrs ago   History of kidney stones    Hyperlipidemia    takes Atorvastatin daily   Hypertension    no meds   Insomnia    takes Ambien nightly   Insomnia    takes gabapentin nightly   Joint pain    Mood swings    Osteoarthritis of knee    Personal history of chemotherapy 12/05/2016   Mets from Breast Cancer   Personal history of radiation therapy 11/2016    Pneumonia    PONV (postoperative nausea and vomiting)    Seasonal allergies    takes Allegra daily    PAST SURGICAL HISTORY:  Past Surgical History:  Procedure Laterality Date   BREAST BIOPSY  2009   cataract surgery Bilateral    COLONOSCOPY     IR RADIOLOGIST EVAL & MGMT  01/29/2018   JOINT REPLACEMENT Left 2014   knee   KNEE ARTHROSCOPY Left    x 5   MASTECTOMY Left    port a cath placed     PORTACATH PLACEMENT N/A 09/17/2015   Procedure: INSERTION PORT-A-CATH;  Surgeon: Jules Husbands, MD;  Location: [facility] ORS;  Service: General;  Laterality: N/A;   RADIOLOGY WITH ANESTHESIA N/A 02/20/2018   Procedure: CT WITH ANESTHESIA MICROWAVE THERMAL ABLATION-LIVER;  Surgeon: Jacqulynn Cadet, MD;  Location: [facility];  Service: Anesthesiology;  Laterality: N/A;   TOOTH EXTRACTION     TOTAL SHOULDER ARTHROPLASTY Right 06/03/2015   Procedure: TOTAL SHOULDER ARTHROPLASTY;  Surgeon: Tania Ade, MD;  Location: [facility];  Service: Orthopedics;  Laterality: Right;  Right total shoulder arthroplasty   TOTAL SHOULDER REPLACEMENT Right 06/03/2015   TUBAL LIGATION      HEMATOLOGY/ONCOLOGY HISTORY:  Oncology History  Cancer of left female breast  ([place])  07/27/2014 Initial Diagnosis   Cancer of left female breast [facility])   Metastatic  breast cancer ([place])  09/24/2015 Initial Diagnosis   Metastatic breast cancer ([place])   02/27/2018 - 05/21/2018 Chemotherapy   The patient had pegfilgrastim (NEULASTA) injection 6 mg, 6 mg, Subcutaneous, Once, 1 of 1 cycle Administration: 6 mg (04/19/2018) pegfilgrastim (NEULASTA ONPRO KIT) injection 6 mg, 6 mg, Subcutaneous, Once, 1 of 2 cycles Administration: 6 mg (05/01/2018), 6 mg (05/15/2018) eriBULin mesylate (HALAVEN) 2.75 mg in sodium chloride 0.9 % 100 mL chemo infusion, 1.4 mg/m2 = 2.75 mg, Intravenous,  Once, 3 of 4 cycles Dose modification: 1.1 mg/m2 (original dose 1.4 mg/m2, Cycle 1, Reason: Dose not tolerated) Administration: 2.75 mg (02/27/2018), 2.15 mg  (03/20/2018), 2.15 mg (04/04/2018), 2.15 mg (04/17/2018), 2.15 mg (05/01/2018), 2.15 mg (05/15/2018)  for chemotherapy treatment.    06/04/2018 -  Chemotherapy   The patient had gemcitabine (GEMZAR) 1,400 mg in sodium chloride 0.9 % 250 mL chemo infusion, 1,406 mg, Intravenous,  Once, 5 of 8 cycles Administration: 1,400 mg (06/04/2018), 1,400 mg (06/18/2018), 1,400 mg (07/02/2018), 1,400 mg (07/16/2018), 1,400 mg (08/08/2018)  for chemotherapy treatment.      ALLERGIES:  is allergic to ace inhibitors; latex; morphine and related; and penicillins.  MEDICATIONS:  Current Outpatient Medications  Medication Sig Dispense Refill   acetaminophen (TYLENOL) 500 MG tablet Take 500 mg by mouth every 6 (six) hours as needed for mild pain or moderate pain.      atorvastatin (LIPITOR) 10 MG tablet Take 10 mg by mouth at bedtime.      buPROPion (WELLBUTRIN XL) 150 MG 24 hr tablet Take 150 mg by mouth at bedtime.      Calcium-Magnesium-Vitamin D (CALCIUM 1200+D3 PO) Take 1 tablet by mouth daily.      Cyanocobalamin 5000 MCG CAPS Take 5,000 mcg by mouth daily.      fexofenadine (ALLEGRA) 180 MG tablet Take 180 mg by mouth at bedtime.      gabapentin (NEURONTIN) 300 MG capsule Take 300-600 mg by mouth 2 (two) times daily. Take 300 mg by mouth in the morning and 372m at night     levofloxacin (LEVAQUIN) 500 MG tablet Take 1 tablet (500 mg total) by mouth daily. 14 tablet 0   lidocaine-prilocaine (EMLA) cream Apply 1 application topically as needed. Apply to port 1-2 hours prior to chemotherapy appointment. Cover with plastic wrap. 30 g 0   metFORMIN (GLUCOPHAGE) 850 MG tablet Take 850 mg by mouth 2 (two) times daily with a meal.      Oxycodone HCl 10 MG TABS Take 1 tablet (10 mg total) by mouth every 4 (four) hours as needed. Ok to take every 4-6 hours as needed. 90 tablet 0   pantoprazole (PROTONIX) 40 MG tablet Take 40 mg by mouth at bedtime.      PARoxetine (PAXIL) 40 MG tablet Take 40 mg by mouth at  bedtime.      prochlorperazine (COMPAZINE) 10 MG tablet TAKE 1 TABLET BY MOUTH EVERY 6 HOURS AS NEEDED FOR  NAUSEA OR  VOMITING 120 tablet 1   sodium chloride (MURO 128) 5 % ophthalmic solution Place 1 drop into both eyes 4 (four) times daily.     zolpidem (AMBIEN) 10 MG tablet Take 1 tablet (10 mg total) by mouth at bedtime as needed (sleep). 90 tablet 0   No current facility-administered medications for this visit.     VITAL SIGNS: There were no vitals taken for this visit. There were no vitals filed for this visit.  Estimated body mass index is 23.86 kg/m as calculated from  the following:   Height as of an earlier encounter on 08/20/18: 5' 4"  (1.626 m).   Weight as of an earlier encounter on 08/20/18: 139 lb (63 kg).  LABS: CBC:    Component Value Date/Time   WBC 4.8 08/20/2018 0935   HGB 9.6 (L) 08/20/2018 0935   HGB 13.2 12/20/2011 0927   HCT 33.4 (L) 08/20/2018 0935   HCT 40.6 12/20/2011 0927   PLT 341 08/20/2018 0935   PLT 235 12/20/2011 0927   MCV 91.0 08/20/2018 0935   MCV 96 12/20/2011 0927   NEUTROABS 2.8 08/20/2018 0935   NEUTROABS 1.7 12/20/2011 0927   LYMPHSABS 1.3 08/20/2018 0935   LYMPHSABS 1.4 12/20/2011 0927   MONOABS 0.6 08/20/2018 0935   MONOABS 0.4 12/20/2011 0927   EOSABS 0.0 08/20/2018 0935   EOSABS 0.4 12/20/2011 0927   BASOSABS 0.0 08/20/2018 0935   BASOSABS 0.0 12/20/2011 0927   Comprehensive Metabolic Panel:    Component Value Date/Time   NA 138 08/13/2018 0857   NA 139 12/20/2011 0927   K 3.9 08/13/2018 0857   K 4.1 12/20/2011 0927   CL 103 08/13/2018 0857   CL 102 12/20/2011 0927   CO2 24 08/13/2018 0857   CO2 26 12/20/2011 0927   BUN 20 08/13/2018 0857   BUN 19 (H) 12/20/2011 0927   CREATININE 0.78 08/13/2018 0857   CREATININE 1.15 12/20/2011 0927   GLUCOSE 235 (H) 08/13/2018 0857   GLUCOSE 251 (H) 12/20/2011 0927   CALCIUM 8.5 (L) 08/13/2018 0857   CALCIUM 8.6 12/20/2011 0927   AST 94 (H) 08/13/2018 0857   AST 49 (H) 12/20/2011  0927   ALT 60 (H) 08/13/2018 0857   ALT 57 12/20/2011 0927   ALKPHOS 157 (H) 08/13/2018 0857   ALKPHOS 61 12/20/2011 0927   BILITOT 0.8 08/13/2018 0857   BILITOT 0.4 12/20/2011 0927   PROT 6.2 (L) 08/13/2018 0857   PROT 6.8 12/20/2011 0927   ALBUMIN 3.2 (L) 08/13/2018 0857   ALBUMIN 3.6 12/20/2011 0927    RADIOGRAPHIC STUDIES: Ct Chest W Contrast  Result Date: 08/06/2018 CLINICAL DATA:  Metastatic breast cancer. EXAM: CT CHEST, ABDOMEN, AND PELVIS WITH CONTRAST TECHNIQUE: Multidetector CT imaging of the chest, abdomen and pelvis was performed following the standard protocol during bolus administration of intravenous contrast. CONTRAST:  160m OMNIPAQUE IOHEXOL 300 MG/ML  SOLN COMPARISON:  05/20/2018 FINDINGS: CT CHEST FINDINGS Cardiovascular: The heart is normal in size. No pericardial effusion. The aorta is normal in caliber. No dissection. Stable coronary artery calcifications. The right-sided Port-A-Cath is in good position. Mediastinum/Nodes: No mediastinal or hilar mass or lymphadenopathy. The esophagus is grossly normal. Small hiatal hernia noted. Lungs/Pleura: Improved aeration of the left lung. There is moderate postinflammatory or postinfectious scarring type changes with interstitial thickening and linear fibrosis. Associated mild loss of volume in the left hemithorax. Stable underlying emphysematous changes. No worrisome pulmonary nodules to suggest pulmonary metastatic disease. No pleural effusions or pleural lesions. Musculoskeletal: Stable surgical changes from a left mastectomy. Stable fluid collection in the left chest wall. No obvious recurrent mass and no supraclavicular or axillary adenopathy. Stable diffuse lytic and sclerotic osseous metastatic disease involving the spine, sternum and ribs. No pathologic fracture or spinal canal compromise. CT ABDOMEN PELVIS FINDINGS Hepatobiliary: Much improved CT appearance of the liver. Prior study showed diffuse confluent hepatic metastatic  disease. Now there are just small scattered low-attenuation lesions in both lobes. The largest lesion is in segment 5 and measures 2.3 cm. This is stable. All  the other lesions measure 10 cm or less. The gallbladder is unremarkable.  No common bile duct dilatation. Pancreas: No mass, inflammation or ductal dilatation. Spleen: Normal size. Stable enhancing lesion with overlying capsular retraction, likely benign hemangioma. Adrenals/Urinary Tract: The adrenal glands and kidneys are unremarkable and stable. Left-sided pararenal cysts are noted. Stomach/Bowel: The stomach, duodenum, small bowel and colon are unremarkable. No acute inflammatory changes, mass lesions or obstructive findings. The terminal ileum is normal. Large amount of stool in the rectum may suggest fecal impaction. Vascular/Lymphatic: The aorta is normal in caliber. No dissection. The branch vessels are patent. The major venous structures are patent. No mesenteric or retroperitoneal mass or adenopathy. Small scattered lymph nodes are noted. Reproductive: The uterus and ovaries are unremarkable. Other: No pelvic mass or adenopathy. No free pelvic fluid collections. No inguinal mass or adenopathy. No abdominal wall hernia or subcutaneous lesions. Musculoskeletal: Diffuse mixed lytic and sclerotic osseous metastatic disease involving the spine and pelvis. No pathologic fracture or spinal canal compromise. IMPRESSION: 1. Resolution of diffuse airspace process seen in the left lung on the prior CT scan. There are postinflammatory or postinfectious scarring changes. 2. No worrisome pulmonary lesions to suggest pulmonary metastatic disease. No mediastinal or hilar adenopathy. 3. Stable surgical changes from a left mastectomy. Postoperative fluid collection in the left chest wall but no findings for recurrent tumor. 4. Much improved CT appearance of the liver. Scattered small residual lesions are noted. 5. Stable mixed lytic and sclerotic osseous metastatic  disease without new/progressive findings or pathologic fracture. Electronically Signed   By: Marijo Sanes M.D.   On: 08/06/2018 11:00   Ct Abdomen Pelvis W Contrast  Result Date: 08/06/2018 CLINICAL DATA:  Metastatic breast cancer. EXAM: CT CHEST, ABDOMEN, AND PELVIS WITH CONTRAST TECHNIQUE: Multidetector CT imaging of the chest, abdomen and pelvis was performed following the standard protocol during bolus administration of intravenous contrast. CONTRAST:  11m OMNIPAQUE IOHEXOL 300 MG/ML  SOLN COMPARISON:  05/20/2018 FINDINGS: CT CHEST FINDINGS Cardiovascular: The heart is normal in size. No pericardial effusion. The aorta is normal in caliber. No dissection. Stable coronary artery calcifications. The right-sided Port-A-Cath is in good position. Mediastinum/Nodes: No mediastinal or hilar mass or lymphadenopathy. The esophagus is grossly normal. Small hiatal hernia noted. Lungs/Pleura: Improved aeration of the left lung. There is moderate postinflammatory or postinfectious scarring type changes with interstitial thickening and linear fibrosis. Associated mild loss of volume in the left hemithorax. Stable underlying emphysematous changes. No worrisome pulmonary nodules to suggest pulmonary metastatic disease. No pleural effusions or pleural lesions. Musculoskeletal: Stable surgical changes from a left mastectomy. Stable fluid collection in the left chest wall. No obvious recurrent mass and no supraclavicular or axillary adenopathy. Stable diffuse lytic and sclerotic osseous metastatic disease involving the spine, sternum and ribs. No pathologic fracture or spinal canal compromise. CT ABDOMEN PELVIS FINDINGS Hepatobiliary: Much improved CT appearance of the liver. Prior study showed diffuse confluent hepatic metastatic disease. Now there are just small scattered low-attenuation lesions in both lobes. The largest lesion is in segment 5 and measures 2.3 cm. This is stable. All the other lesions measure 10 cm or  less. The gallbladder is unremarkable.  No common bile duct dilatation. Pancreas: No mass, inflammation or ductal dilatation. Spleen: Normal size. Stable enhancing lesion with overlying capsular retraction, likely benign hemangioma. Adrenals/Urinary Tract: The adrenal glands and kidneys are unremarkable and stable. Left-sided pararenal cysts are noted. Stomach/Bowel: The stomach, duodenum, small bowel and colon are unremarkable. No acute inflammatory changes, mass  lesions or obstructive findings. The terminal ileum is normal. Large amount of stool in the rectum may suggest fecal impaction. Vascular/Lymphatic: The aorta is normal in caliber. No dissection. The branch vessels are patent. The major venous structures are patent. No mesenteric or retroperitoneal mass or adenopathy. Small scattered lymph nodes are noted. Reproductive: The uterus and ovaries are unremarkable. Other: No pelvic mass or adenopathy. No free pelvic fluid collections. No inguinal mass or adenopathy. No abdominal wall hernia or subcutaneous lesions. Musculoskeletal: Diffuse mixed lytic and sclerotic osseous metastatic disease involving the spine and pelvis. No pathologic fracture or spinal canal compromise. IMPRESSION: 1. Resolution of diffuse airspace process seen in the left lung on the prior CT scan. There are postinflammatory or postinfectious scarring changes. 2. No worrisome pulmonary lesions to suggest pulmonary metastatic disease. No mediastinal or hilar adenopathy. 3. Stable surgical changes from a left mastectomy. Postoperative fluid collection in the left chest wall but no findings for recurrent tumor. 4. Much improved CT appearance of the liver. Scattered small residual lesions are noted. 5. Stable mixed lytic and sclerotic osseous metastatic disease without new/progressive findings or pathologic fracture. Electronically Signed   By: Marijo Sanes M.D.   On: 08/06/2018 11:00    PERFORMANCE STATUS (ECOG) : 3  Review of Systems    Constitutional: Positive for appetite change (improved). Negative for activity change, chills, fatigue, fever and unexpected weight change.  HENT: Positive for trouble swallowing (improved). Negative for dental problem and mouth sores.   Eyes: Negative for pain and redness.  Respiratory: Negative for cough and shortness of breath.   Cardiovascular: Negative for chest pain and leg swelling.  Gastrointestinal: Negative for abdominal pain, anal bleeding, constipation, diarrhea, nausea and vomiting.  Endocrine: Negative for cold intolerance and heat intolerance.  Genitourinary: Negative for decreased urine volume, difficulty urinating, dysuria, flank pain, frequency, pelvic pain and urgency.  Musculoskeletal: Negative for arthralgias, gait problem and myalgias.       Sacral pain- chronic; stable  Skin: Negative for rash and wound.  Neurological: Negative for dizziness, weakness, light-headedness and headaches.  Hematological: Negative for adenopathy.  Psychiatric/Behavioral: Positive for sleep disturbance (stable with ambien). Negative for confusion. The patient is not nervous/anxious.   All other systems reviewed and are negative.  As noted above. Otherwise, a complete review of systems is negative.  Physical Exam Constitutional:      General: She is not in acute distress.    Comments: Frail, thin built  HENT:     Head:     Comments: Thin, sparse hair    Mouth/Throat:     Mouth: Mucous membranes are moist.     Pharynx: Oropharynx is clear.  Eyes:     General: No scleral icterus.    Conjunctiva/sclera: Conjunctivae normal.  Cardiovascular:     Rate and Rhythm: Normal rate and regular rhythm.  Pulmonary:     Effort: Pulmonary effort is normal.     Breath sounds: Normal breath sounds.  Abdominal:     Palpations: Abdomen is soft.     Tenderness: There is no abdominal tenderness.  Skin:    General: Skin is warm and dry.     Findings: No rash.  Neurological:     General: No focal  deficit present.     Mental Status: She is alert and oriented to person, place, and time.     Motor: Weakness present.  Psychiatric:        Mood and Affect: Mood normal.  Behavior: Behavior normal.     IMPRESSION: Follow-up visit today; I met with patient in infusion suite while receiving her chemotherapy.  Patient also saw Dr. Grayland Ormond.  Previously, options for continued treatment were discussed including transition to comfort based care versus continuation of systemic therapy and patient elected to continue treatment.  Currently receiving single agent gemcitabine. Imaging from 08/06/2018 showed improved to stable disease.      She is also followed by Natalia Leatherwood, NP with [facility] for home-based palliative care.  She has been cleared by physical therapy and occupational therapy and continues to work with speech therapy which she says has significantly improved her swallowing. She says she is eating more and feels better. She continues ambien intermittently for insomnia which she says is well controlled. She is followed by Jennet Maduro, RD on a as needed basis due to patient and spouse preference.  Her weight is currently stable.  She has intermittent pain which is improved with repositioning and takes pain medication very infrequently/rarely-she says her last dose was several months ago.    She says she is tolerating treatment well and feels optimistic today.  She says this is the best she has felt in some time and today is "good day".  She was urged to continue treatment and this was also recommended by Dr. Grayland Ormond.  She understands that there may be a role for hospice in the future.  She declines or disease continues to progress.    PLAN: - Continue current scope of treatment per Dr. Grayland Ormond - Continue home based speech therapy  - Continue calorie and protein rich foods with goal of stable weight - Continue home-based palliative care - Asked her to bring copy of ACP documents to  scanned into her chart - Continue energy conservation with periods of rest and physical activity as tolerated - RTC in 2 weeks for follow up with Billey Chang, NP  Patient expressed understanding and was in agreement with this plan. She also understands that She can call clinic at any time with any questions, concerns, or complaints.   Time Total: 15 minutes  Visit consisted of counseling and education dealing with the complex and emotionally intense issues of symptom management and palliative care in the setting of serious and potentially life-threatening illness.Greater than 50%  of this time was spent counseling and coordinating care related to the above assessment and plan.  Signed by: Beckey Rutter, DNP, AGNP-C [facility] [phone] (work cell) [phone] (office)

## 2018-08-21 LAB — SAMPLE TO BLOOD BANK

## 2018-08-23 ENCOUNTER — Telehealth: Payer: Self-pay | Admitting: *Deleted

## 2018-08-23 DIAGNOSIS — R1310 Dysphagia, unspecified: Secondary | ICD-10-CM | POA: Diagnosis not present

## 2018-08-23 DIAGNOSIS — D630 Anemia in neoplastic disease: Secondary | ICD-10-CM | POA: Diagnosis not present

## 2018-08-23 DIAGNOSIS — C50912 Malignant neoplasm of unspecified site of left female breast: Secondary | ICD-10-CM | POA: Diagnosis not present

## 2018-08-23 DIAGNOSIS — E1142 Type 2 diabetes mellitus with diabetic polyneuropathy: Secondary | ICD-10-CM | POA: Diagnosis not present

## 2018-08-23 DIAGNOSIS — C7951 Secondary malignant neoplasm of bone: Secondary | ICD-10-CM | POA: Diagnosis not present

## 2018-08-23 DIAGNOSIS — C787 Secondary malignant neoplasm of liver and intrahepatic bile duct: Secondary | ICD-10-CM | POA: Diagnosis not present

## 2018-08-23 NOTE — Telephone Encounter (Signed)
[facility] as having met all goals, She is swallowing and has no signs of aspiration and her appetite has picked up as well.

## 2018-08-26 ENCOUNTER — Other Ambulatory Visit: Payer: Self-pay

## 2018-08-26 DIAGNOSIS — R1310 Dysphagia, unspecified: Secondary | ICD-10-CM | POA: Diagnosis not present

## 2018-08-26 DIAGNOSIS — C7951 Secondary malignant neoplasm of bone: Secondary | ICD-10-CM | POA: Diagnosis not present

## 2018-08-26 DIAGNOSIS — D630 Anemia in neoplastic disease: Secondary | ICD-10-CM | POA: Diagnosis not present

## 2018-08-26 DIAGNOSIS — E1142 Type 2 diabetes mellitus with diabetic polyneuropathy: Secondary | ICD-10-CM | POA: Diagnosis not present

## 2018-08-26 DIAGNOSIS — C787 Secondary malignant neoplasm of liver and intrahepatic bile duct: Secondary | ICD-10-CM | POA: Diagnosis not present

## 2018-08-26 DIAGNOSIS — C50912 Malignant neoplasm of unspecified site of left female breast: Secondary | ICD-10-CM | POA: Diagnosis not present

## 2018-08-27 ENCOUNTER — Other Ambulatory Visit: Payer: Self-pay

## 2018-08-27 ENCOUNTER — Inpatient Hospital Stay: Payer: Medicare Other

## 2018-08-27 DIAGNOSIS — C50919 Malignant neoplasm of unspecified site of unspecified female breast: Secondary | ICD-10-CM | POA: Diagnosis not present

## 2018-08-27 DIAGNOSIS — Z5111 Encounter for antineoplastic chemotherapy: Secondary | ICD-10-CM | POA: Diagnosis not present

## 2018-08-27 DIAGNOSIS — C7951 Secondary malignant neoplasm of bone: Secondary | ICD-10-CM | POA: Diagnosis not present

## 2018-08-27 DIAGNOSIS — C787 Secondary malignant neoplasm of liver and intrahepatic bile duct: Secondary | ICD-10-CM | POA: Diagnosis not present

## 2018-08-27 LAB — SAMPLE TO BLOOD BANK

## 2018-08-27 LAB — CBC WITH DIFFERENTIAL/PLATELET
Abs Immature Granulocytes: 0.03 10*3/uL (ref 0.00–0.07)
Basophils Absolute: 0 10*3/uL (ref 0.0–0.1)
Basophils Relative: 1 %
Eosinophils Absolute: 0 10*3/uL (ref 0.0–0.5)
Eosinophils Relative: 0 %
HCT: 32.4 % — ABNORMAL LOW (ref 36.0–46.0)
Hemoglobin: 9.3 g/dL — ABNORMAL LOW (ref 12.0–15.0)
Immature Granulocytes: 1 %
Lymphocytes Relative: 32 %
Lymphs Abs: 0.9 10*3/uL (ref 0.7–4.0)
MCH: 25.8 pg — ABNORMAL LOW (ref 26.0–34.0)
MCHC: 28.7 g/dL — ABNORMAL LOW (ref 30.0–36.0)
MCV: 89.8 fL (ref 80.0–100.0)
Monocytes Absolute: 0.6 10*3/uL (ref 0.1–1.0)
Monocytes Relative: 19 %
Neutro Abs: 1.4 10*3/uL — ABNORMAL LOW (ref 1.7–7.7)
Neutrophils Relative %: 47 %
Platelets: 262 10*3/uL (ref 150–400)
RBC: 3.61 MIL/uL — ABNORMAL LOW (ref 3.87–5.11)
RDW: 17.8 % — ABNORMAL HIGH (ref 11.5–15.5)
WBC: 3 10*3/uL — ABNORMAL LOW (ref 4.0–10.5)
nRBC: 0 % (ref 0.0–0.2)

## 2018-08-27 LAB — COMPREHENSIVE METABOLIC PANEL
ALT: 50 U/L — ABNORMAL HIGH (ref 0–44)
AST: 70 U/L — ABNORMAL HIGH (ref 15–41)
Albumin: 3.2 g/dL — ABNORMAL LOW (ref 3.5–5.0)
Alkaline Phosphatase: 129 U/L — ABNORMAL HIGH (ref 38–126)
Anion gap: 8 (ref 5–15)
BUN: 17 mg/dL (ref 8–23)
CO2: 25 mmol/L (ref 22–32)
Calcium: 8.7 mg/dL — ABNORMAL LOW (ref 8.9–10.3)
Chloride: 107 mmol/L (ref 98–111)
Creatinine, Ser: 0.65 mg/dL (ref 0.44–1.00)
GFR calc Af Amer: >60 mL/min (ref >60)
GFR calc non Af Amer: >60 mL/min (ref >60)
Glucose, Bld: 177 mg/dL — ABNORMAL HIGH (ref 70–99)
Potassium: 3.9 mmol/L (ref 3.5–5.1)
Sodium: 140 mmol/L (ref 135–145)
Total Bilirubin: 0.5 mg/dL (ref 0.3–1.2)
Total Protein: 6 g/dL — ABNORMAL LOW (ref 6.5–8.1)

## 2018-08-29 NOTE — Progress Notes (Signed)
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  INTERVAL HISTORY: Patient returns to clinic today for further evaluation and continuation of gemcitabine.  She complains of being persistently cold.  Her performance status continues to improve.  She continues to have good appetite and continues to gain weight.  She denies any pain today.  She has chronic weakness and fatigue. Her chronic peripheral neuropathy is unchanged.  She has no other neurologic complaints.  She denies any recent fevers or illnesses.  She denies any chest pain, shortness of breath, cough, or hemoptysis.  She has no further issues with aspiration.  She denies any nausea, vomiting, constipation, or diarrhea. She has no urinary complaints.  Patient offers no further specific complaints today.    REVIEW OF SYSTEMS:   Review of Systems  Constitutional: Positive for malaise/fatigue. Negative for fever and weight loss.  Eyes: Negative.  Negative for blurred vision, double vision and pain.  Respiratory: Negative.  Negative for cough and shortness of breath.   Cardiovascular: Negative.  Negative for chest pain and leg swelling.  Gastrointestinal: Negative.  Negative for abdominal pain.  Genitourinary: Negative.  Negative for dysuria and flank pain.  Musculoskeletal: Negative for back pain and falls.  Skin: Negative.  Negative for rash.  Neurological: Positive for tingling, sensory change and weakness. Negative for focal weakness and headaches.  Endo/Heme/Allergies: Negative.   Psychiatric/Behavioral: Negative.  Negative for memory loss. The patient is not nervous/anxious and does not have  insomnia.     As per HPI. Otherwise, a complete review of systems is negative.  PAST MEDICAL HISTORY: Past Medical History:  Diagnosis Date   Anxiety    Back pain    occasionally   Breast cancer ([place]) 2009   left   Depression    takes Paxil and Wellbutrin daily   Diabetes ([place])    takes Metformin daily   type 2    Genetic testing 02/03/2017   Multi-Cancer panel (83 genes) @ [facility] - No pathogenic mutations detected   GERD (gastroesophageal reflux disease)    occasional   History of bronchitis 2 yrs ago   History of kidney stones    Hyperlipidemia    takes Atorvastatin daily   Hypertension    no meds   Insomnia    takes Ambien nightly   Insomnia    takes gabapentin nightly   Joint pain    Mood swings    Osteoarthritis of knee    Personal history of chemotherapy 12/05/2016   Mets from Breast Cancer   Personal history of radiation therapy 11/2016   Pneumonia    PONV (postoperative nausea and vomiting)    Seasonal allergies    takes Allegra daily    PAST SURGICAL HISTORY: Past Surgical History:  Procedure Laterality Date   BREAST BIOPSY  2009   cataract surgery Bilateral    COLONOSCOPY     IR RADIOLOGIST EVAL & MGMT  01/29/2018   JOINT REPLACEMENT Left 2014   knee   KNEE ARTHROSCOPY Left    x 5   MASTECTOMY Left    port a cath placed     PORTACATH PLACEMENT N/A 09/17/2015   Procedure: INSERTION PORT-A-CATH;  Surgeon: Marjory Lies  Pabon, MD;  Location: [facility];  Service: General;  Laterality: N/A;   RADIOLOGY WITH ANESTHESIA N/A 02/20/2018   Procedure: CT WITH ANESTHESIA MICROWAVE THERMAL ABLATION-LIVER;  Surgeon: Jacqulynn Cadet, MD;  Location: [facility];  Service: Anesthesiology;  Laterality: N/A;   TOOTH EXTRACTION     TOTAL SHOULDER ARTHROPLASTY Right 06/03/2015   Procedure: TOTAL SHOULDER ARTHROPLASTY;  Surgeon: Tania Ade, MD;  Location: [place];  Service: Orthopedics;  Laterality: Right;  Right total shoulder arthroplasty    TOTAL SHOULDER REPLACEMENT Right 06/03/2015   TUBAL LIGATION      FAMILY HISTORY: Father with non-Hodgkin's lymphoma, 2 paternal aunts with breast cancer.     ADVANCED DIRECTIVES:    HEALTH MAINTENANCE: Social History   Tobacco Use   Smoking status: Never Smoker   Smokeless tobacco: Never Used  Substance Use Topics   Alcohol use: Yes    Alcohol/week: 0.0 standard drinks    Comment: occasionally wine   Drug use: Yes    Types: Marijuana    Comment: cannabis with no extra  low dose edibles  for neuropathy     Colonoscopy:  PAP:  Bone density:  Lipid panel:  Allergies  Allergen Reactions   Ace Inhibitors Cough   Latex Itching   Morphine And Related Itching    Caused her to itch terribly. Would prefer if given to take with a benadryl   Penicillins Rash    Has patient had a PCN reaction causing immediate rash, facial/tongue/throat swelling, SOB or lightheadedness with hypotension: No Has patient had a PCN reaction causing severe rash involving mucus membranes or skin necrosis: No Has patient had a PCN reaction that required hospitalization No Has patient had a PCN reaction occurring within the last 10 years: No If all of the above answers are "NO", then may proceed with Cephalosporin use.    Current Outpatient Medications  Medication Sig Dispense Refill   acetaminophen (TYLENOL) 500 MG tablet Take 500 mg by mouth every 6 (six) hours as needed for mild pain or moderate pain.      atorvastatin (LIPITOR) 10 MG tablet Take 10 mg by mouth at bedtime.      buPROPion (WELLBUTRIN XL) 150 MG 24 hr tablet Take 150 mg by mouth at bedtime.      Calcium-Magnesium-Vitamin D (CALCIUM 1200+D3 PO) Take 1 tablet by mouth daily.      Cyanocobalamin 5000 MCG CAPS Take 5,000 mcg by mouth daily.      fexofenadine (ALLEGRA) 180 MG tablet Take 180 mg by mouth at bedtime.      gabapentin (NEURONTIN) 300 MG capsule Take 300-600 mg by mouth 2 (two) times daily. Take 300 mg by mouth  in the morning and 350m at night     lidocaine-prilocaine (EMLA) cream Apply 1 application topically as needed. Apply to port 1-2 hours prior to chemotherapy appointment. Cover with plastic wrap. 30 g 0   metFORMIN (GLUCOPHAGE) 850 MG tablet Take 850 mg by mouth 2 (two) times daily with a meal.      pantoprazole (PROTONIX) 40 MG tablet Take 40 mg by mouth at bedtime.      PARoxetine (PAXIL) 40 MG tablet Take 40 mg by mouth at bedtime.      prochlorperazine (COMPAZINE) 10 MG tablet TAKE 1 TABLET BY MOUTH EVERY 6 HOURS AS NEEDED FOR  NAUSEA OR  VOMITING 120 tablet 1   sodium chloride (MURO 128) 5 % ophthalmic solution Place 1 drop into both eyes 4 (four) times daily.  zolpidem (AMBIEN) 10 MG tablet Take 1 tablet (10 mg total) by mouth at bedtime as needed (sleep). 90 tablet 0   Oxycodone HCl 10 MG TABS Take 1 tablet (10 mg total) by mouth every 4 (four) hours as needed. Ok to take every 4-6 hours as needed. (Patient not taking: Reported on 09/03/2018) 90 tablet 0   No current facility-administered medications for this visit.    Facility-Administered Medications Ordered in Other Visits  Medication Dose Route Frequency Provider Last Rate Last Dose   gemcitabine (GEMZAR) 1,400 mg in sodium chloride 0.9 % 250 mL chemo infusion  1,400 mg Intravenous Once Lloyd Huger, MD 574 mL/hr at 09/03/18 1133 1,400 mg at 09/03/18 1133   heparin lock flush 100 unit/mL  500 Units Intravenous Once Lloyd Huger, MD       sodium chloride flush (NS) 0.9 % injection 10 mL  10 mL Intravenous PRN Lloyd Huger, MD   10 mL at 09/03/18 0945    OBJECTIVE: Vitals:   09/03/18 1008  BP: 113/75  Pulse: 98  Resp: 18  Temp: (!) 96.8 F (36 C)     Body mass index is 24.13 kg/m.    ECOG FS:2 - Symptomatic, <50% confined to bed  General: Thin, no acute distress.  Sitting in a wheelchair. Eyes: Pink conjunctiva, anicteric sclera. HEENT: Normocephalic, moist mucous membranes. Lungs: Clear to  auscultation bilaterally. Heart: Regular rate and rhythm. No rubs, murmurs, or gallops. Abdomen: Soft, nontender, nondistended. No organomegaly noted, normoactive bowel sounds. Musculoskeletal: No edema, cyanosis, or clubbing. Neuro: Alert, answering all questions appropriately. Cranial nerves grossly intact. Skin: No rashes or petechiae noted. Psych: Normal affect.  LAB RESULTS:  Lab Results  Component Value Date   NA 140 09/03/2018   K 4.2 09/03/2018   CL 112 (H) 09/03/2018   CO2 21 (L) 09/03/2018   GLUCOSE 174 (H) 09/03/2018   BUN 12 09/03/2018   CREATININE 0.59 09/03/2018   CALCIUM 8.2 (L) 09/03/2018   PROT 5.8 (L) 09/03/2018   ALBUMIN 3.0 (L) 09/03/2018   AST 54 (H) 09/03/2018   ALT 25 09/03/2018   ALKPHOS 112 09/03/2018   BILITOT 0.3 09/03/2018   GFRNONAA >60 09/03/2018   GFRAA >60 09/03/2018    Lab Results  Component Value Date   WBC 4.3 09/03/2018   NEUTROABS 2.6 09/03/2018   HGB 8.9 (L) 09/03/2018   HCT 30.9 (L) 09/03/2018   MCV 88.5 09/03/2018   PLT 337 09/03/2018     STUDIES: Ct Chest W Contrast  Result Date: 08/06/2018 CLINICAL DATA:  Metastatic breast cancer. EXAM: CT CHEST, ABDOMEN, AND PELVIS WITH CONTRAST TECHNIQUE: Multidetector CT imaging of the chest, abdomen and pelvis was performed following the standard protocol during bolus administration of intravenous contrast. CONTRAST:  153m OMNIPAQUE IOHEXOL 300 MG/ML  SOLN COMPARISON:  05/20/2018 FINDINGS: CT CHEST FINDINGS Cardiovascular: The heart is normal in size. No pericardial effusion. The aorta is normal in caliber. No dissection. Stable coronary artery calcifications. The right-sided Port-A-Cath is in good position. Mediastinum/Nodes: No mediastinal or hilar mass or lymphadenopathy. The esophagus is grossly normal. Small hiatal hernia noted. Lungs/Pleura: Improved aeration of the left lung. There is moderate postinflammatory or postinfectious scarring type changes with interstitial thickening and  linear fibrosis. Associated mild loss of volume in the left hemithorax. Stable underlying emphysematous changes. No worrisome pulmonary nodules to suggest pulmonary metastatic disease. No pleural effusions or pleural lesions. Musculoskeletal: Stable surgical changes from a left mastectomy. Stable fluid collection in the left  chest wall. No obvious recurrent mass and no supraclavicular or axillary adenopathy. Stable diffuse lytic and sclerotic osseous metastatic disease involving the spine, sternum and ribs. No pathologic fracture or spinal canal compromise. CT ABDOMEN PELVIS FINDINGS Hepatobiliary: Much improved CT appearance of the liver. Prior study showed diffuse confluent hepatic metastatic disease. Now there are just small scattered low-attenuation lesions in both lobes. The largest lesion is in segment 5 and measures 2.3 cm. This is stable. All the other lesions measure 10 cm or less. The gallbladder is unremarkable.  No common bile duct dilatation. Pancreas: No mass, inflammation or ductal dilatation. Spleen: Normal size. Stable enhancing lesion with overlying capsular retraction, likely benign hemangioma. Adrenals/Urinary Tract: The adrenal glands and kidneys are unremarkable and stable. Left-sided pararenal cysts are noted. Stomach/Bowel: The stomach, duodenum, small bowel and colon are unremarkable. No acute inflammatory changes, mass lesions or obstructive findings. The terminal ileum is normal. Large amount of stool in the rectum may suggest fecal impaction. Vascular/Lymphatic: The aorta is normal in caliber. No dissection. The branch vessels are patent. The major venous structures are patent. No mesenteric or retroperitoneal mass or adenopathy. Small scattered lymph nodes are noted. Reproductive: The uterus and ovaries are unremarkable. Other: No pelvic mass or adenopathy. No free pelvic fluid collections. No inguinal mass or adenopathy. No abdominal wall hernia or subcutaneous lesions. Musculoskeletal:  Diffuse mixed lytic and sclerotic osseous metastatic disease involving the spine and pelvis. No pathologic fracture or spinal canal compromise. IMPRESSION: 1. Resolution of diffuse airspace process seen in the left lung on the prior CT scan. There are postinflammatory or postinfectious scarring changes. 2. No worrisome pulmonary lesions to suggest pulmonary metastatic disease. No mediastinal or hilar adenopathy. 3. Stable surgical changes from a left mastectomy. Postoperative fluid collection in the left chest wall but no findings for recurrent tumor. 4. Much improved CT appearance of the liver. Scattered small residual lesions are noted. 5. Stable mixed lytic and sclerotic osseous metastatic disease without new/progressive findings or pathologic fracture. Electronically Signed   By: Marijo Sanes M.D.   On: 08/06/2018 11:00   Ct Abdomen Pelvis W Contrast  Result Date: 08/06/2018 CLINICAL DATA:  Metastatic breast cancer. EXAM: CT CHEST, ABDOMEN, AND PELVIS WITH CONTRAST TECHNIQUE: Multidetector CT imaging of the chest, abdomen and pelvis was performed following the standard protocol during bolus administration of intravenous contrast. CONTRAST:  167m OMNIPAQUE IOHEXOL 300 MG/ML  SOLN COMPARISON:  05/20/2018 FINDINGS: CT CHEST FINDINGS Cardiovascular: The heart is normal in size. No pericardial effusion. The aorta is normal in caliber. No dissection. Stable coronary artery calcifications. The right-sided Port-A-Cath is in good position. Mediastinum/Nodes: No mediastinal or hilar mass or lymphadenopathy. The esophagus is grossly normal. Small hiatal hernia noted. Lungs/Pleura: Improved aeration of the left lung. There is moderate postinflammatory or postinfectious scarring type changes with interstitial thickening and linear fibrosis. Associated mild loss of volume in the left hemithorax. Stable underlying emphysematous changes. No worrisome pulmonary nodules to suggest pulmonary metastatic disease. No pleural  effusions or pleural lesions. Musculoskeletal: Stable surgical changes from a left mastectomy. Stable fluid collection in the left chest wall. No obvious recurrent mass and no supraclavicular or axillary adenopathy. Stable diffuse lytic and sclerotic osseous metastatic disease involving the spine, sternum and ribs. No pathologic fracture or spinal canal compromise. CT ABDOMEN PELVIS FINDINGS Hepatobiliary: Much improved CT appearance of the liver. Prior study showed diffuse confluent hepatic metastatic disease. Now there are just small scattered low-attenuation lesions in both lobes. The largest lesion is in  segment 5 and measures 2.3 cm. This is stable. All the other lesions measure 10 cm or less. The gallbladder is unremarkable.  No common bile duct dilatation. Pancreas: No mass, inflammation or ductal dilatation. Spleen: Normal size. Stable enhancing lesion with overlying capsular retraction, likely benign hemangioma. Adrenals/Urinary Tract: The adrenal glands and kidneys are unremarkable and stable. Left-sided pararenal cysts are noted. Stomach/Bowel: The stomach, duodenum, small bowel and colon are unremarkable. No acute inflammatory changes, mass lesions or obstructive findings. The terminal ileum is normal. Large amount of stool in the rectum may suggest fecal impaction. Vascular/Lymphatic: The aorta is normal in caliber. No dissection. The branch vessels are patent. The major venous structures are patent. No mesenteric or retroperitoneal mass or adenopathy. Small scattered lymph nodes are noted. Reproductive: The uterus and ovaries are unremarkable. Other: No pelvic mass or adenopathy. No free pelvic fluid collections. No inguinal mass or adenopathy. No abdominal wall hernia or subcutaneous lesions. Musculoskeletal: Diffuse mixed lytic and sclerotic osseous metastatic disease involving the spine and pelvis. No pathologic fracture or spinal canal compromise. IMPRESSION: 1. Resolution of diffuse airspace  process seen in the left lung on the prior CT scan. There are postinflammatory or postinfectious scarring changes. 2. No worrisome pulmonary lesions to suggest pulmonary metastatic disease. No mediastinal or hilar adenopathy. 3. Stable surgical changes from a left mastectomy. Postoperative fluid collection in the left chest wall but no findings for recurrent tumor. 4. Much improved CT appearance of the liver. Scattered small residual lesions are noted. 5. Stable mixed lytic and sclerotic osseous metastatic disease without new/progressive findings or pathologic fracture. Electronically Signed   By: Marijo Sanes M.D.   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  PLAN:    1.  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  There is no other obvious sites of disease. MRI of the brain on Jun 03, 2018 did not reveal metastatic disease.  Hospice and end-of-life care have been discussed on multiple occasions, but patient declined and wished to try additional chemotherapy.  She expressed understanding that there is likely no additional options after trying single agent gemcitabine.  Given patient's persistent pancytopenia, she will likely only be able to tolerate treatment every other week.  Proceed with cycle 7 of gemcitabine today.  Return to clinic in 2 weeks for further evaluation and consideration of cycle 8.   2.  Anemia: Patient's hemoglobin is slowly trending down and is now 8.9.  She does not require transfusion. 3.  Peripheral neuropathy: Chronic and unchanged.   4.  Back pain: MRI results from May 04, 2017 did not reveal metastatic disease.  Continue symptomatic treatment.  5.  Left IJ clot: Ultrasound results reviewed independently.  Patient has  been instructed to discontinue Eliquis. 6.  Neutropenia: Resolved. 7.  Memory complaints/confusion: Repeat MRI of the brain on Jun 03, 2018 reviewed independently with no obvious intracranial abnormality.  Patient continues to have progressive and persistent osseous metastatic disease. 8.  Elevated liver enzymes: Nearly resolved.  AST remains mildly elevated at 54.  Gemcitabine does not need to be dose reduced in the setting of liver dysfunction. 9.  Apparent aspiration: Resolved. 10.  Poor appetite: Improved.  Patient is now gaining weight. 11.  Insomnia: Improved.  Continue Ambien as prescribed.  12.  Bilateral lung opacities: Essentially resolved.  Patient has completed a 14-day course of Levaquin.  Patient expressed understanding and was in agreement with this plan. She also understands that She can call clinic at any time with any questions, concerns, or complaints.   Breast cancer   Staging form: Breast, AJCC 7th Edition     Pathologic stage from 08/11/2014: Stage IIIA (T0, N2a, cM0) - Signed by Lloyd Huger, MD on 08/11/2014   Lloyd Huger, MD   09/03/2018 11:45 AM

## 2018-08-30 DIAGNOSIS — C7951 Secondary malignant neoplasm of bone: Secondary | ICD-10-CM | POA: Diagnosis not present

## 2018-08-30 DIAGNOSIS — D630 Anemia in neoplastic disease: Secondary | ICD-10-CM | POA: Diagnosis not present

## 2018-08-30 DIAGNOSIS — C50912 Malignant neoplasm of unspecified site of left female breast: Secondary | ICD-10-CM | POA: Diagnosis not present

## 2018-08-30 DIAGNOSIS — C787 Secondary malignant neoplasm of liver and intrahepatic bile duct: Secondary | ICD-10-CM | POA: Diagnosis not present

## 2018-08-30 DIAGNOSIS — E1142 Type 2 diabetes mellitus with diabetic polyneuropathy: Secondary | ICD-10-CM | POA: Diagnosis not present

## 2018-08-30 DIAGNOSIS — R1310 Dysphagia, unspecified: Secondary | ICD-10-CM | POA: Diagnosis not present

## 2018-09-02 ENCOUNTER — Other Ambulatory Visit: Payer: Self-pay

## 2018-09-03 ENCOUNTER — Inpatient Hospital Stay (HOSPITAL_BASED_OUTPATIENT_CLINIC_OR_DEPARTMENT_OTHER): Payer: Medicare Other | Admitting: Oncology

## 2018-09-03 ENCOUNTER — Other Ambulatory Visit: Payer: Self-pay

## 2018-09-03 ENCOUNTER — Encounter: Payer: Self-pay | Admitting: Oncology

## 2018-09-03 ENCOUNTER — Inpatient Hospital Stay: Payer: Medicare Other

## 2018-09-03 VITALS — BP 113/75 | HR 98 | Temp 96.8°F | Resp 18 | Wt 140.6 lb

## 2018-09-03 DIAGNOSIS — C50919 Malignant neoplasm of unspecified site of unspecified female breast: Secondary | ICD-10-CM | POA: Diagnosis not present

## 2018-09-03 DIAGNOSIS — C7951 Secondary malignant neoplasm of bone: Secondary | ICD-10-CM | POA: Diagnosis not present

## 2018-09-03 DIAGNOSIS — C787 Secondary malignant neoplasm of liver and intrahepatic bile duct: Secondary | ICD-10-CM | POA: Diagnosis not present

## 2018-09-03 DIAGNOSIS — Z5111 Encounter for antineoplastic chemotherapy: Secondary | ICD-10-CM | POA: Diagnosis not present

## 2018-09-03 LAB — CBC WITH DIFFERENTIAL/PLATELET
Abs Immature Granulocytes: 0.02 10*3/uL (ref 0.00–0.07)
Basophils Absolute: 0 10*3/uL (ref 0.0–0.1)
Basophils Relative: 1 %
Eosinophils Absolute: 0.1 10*3/uL (ref 0.0–0.5)
Eosinophils Relative: 2 %
HCT: 30.9 % — ABNORMAL LOW (ref 36.0–46.0)
Hemoglobin: 8.9 g/dL — ABNORMAL LOW (ref 12.0–15.0)
Immature Granulocytes: 1 %
Lymphocytes Relative: 21 %
Lymphs Abs: 0.9 10*3/uL (ref 0.7–4.0)
MCH: 25.5 pg — ABNORMAL LOW (ref 26.0–34.0)
MCHC: 28.8 g/dL — ABNORMAL LOW (ref 30.0–36.0)
MCV: 88.5 fL (ref 80.0–100.0)
Monocytes Absolute: 0.6 10*3/uL (ref 0.1–1.0)
Monocytes Relative: 15 %
Neutro Abs: 2.6 10*3/uL (ref 1.7–7.7)
Neutrophils Relative %: 60 %
Platelets: 337 10*3/uL (ref 150–400)
RBC: 3.49 MIL/uL — ABNORMAL LOW (ref 3.87–5.11)
RDW: 18.3 % — ABNORMAL HIGH (ref 11.5–15.5)
WBC: 4.3 10*3/uL (ref 4.0–10.5)
nRBC: 0 % (ref 0.0–0.2)

## 2018-09-03 LAB — COMPREHENSIVE METABOLIC PANEL
ALT: 25 U/L (ref 0–44)
AST: 54 U/L — ABNORMAL HIGH (ref 15–41)
Albumin: 3 g/dL — ABNORMAL LOW (ref 3.5–5.0)
Alkaline Phosphatase: 112 U/L (ref 38–126)
Anion gap: 7 (ref 5–15)
BUN: 12 mg/dL (ref 8–23)
CO2: 21 mmol/L — ABNORMAL LOW (ref 22–32)
Calcium: 8.2 mg/dL — ABNORMAL LOW (ref 8.9–10.3)
Chloride: 112 mmol/L — ABNORMAL HIGH (ref 98–111)
Creatinine, Ser: 0.59 mg/dL (ref 0.44–1.00)
GFR calc Af Amer: >60 mL/min (ref >60)
GFR calc non Af Amer: >60 mL/min (ref >60)
Glucose, Bld: 174 mg/dL — ABNORMAL HIGH (ref 70–99)
Potassium: 4.2 mmol/L (ref 3.5–5.1)
Sodium: 140 mmol/L (ref 135–145)
Total Bilirubin: 0.3 mg/dL (ref 0.3–1.2)
Total Protein: 5.8 g/dL — ABNORMAL LOW (ref 6.5–8.1)

## 2018-09-03 MED ORDER — HEPARIN SOD (PORK) LOCK FLUSH 100 UNIT/ML IV SOLN
500.0000 [IU] | Freq: Once | INTRAVENOUS | Status: AC
  Administered 2018-09-03: 500 [IU] via INTRAVENOUS
  Filled 2018-09-03: qty 5

## 2018-09-03 MED ORDER — SODIUM CHLORIDE 0.9 % IV SOLN
1400.0000 mg | Freq: Once | INTRAVENOUS | Status: AC
  Administered 2018-09-03: 1400 mg via INTRAVENOUS
  Filled 2018-09-03: qty 10.52

## 2018-09-03 MED ORDER — SODIUM CHLORIDE 0.9 % IV SOLN
Freq: Once | INTRAVENOUS | Status: AC
  Administered 2018-09-03: 11:00:00 via INTRAVENOUS
  Filled 2018-09-03: qty 250

## 2018-09-03 MED ORDER — SODIUM CHLORIDE 0.9% FLUSH
10.0000 mL | INTRAVENOUS | Status: DC | PRN
  Administered 2018-09-03: 10 mL via INTRAVENOUS
  Filled 2018-09-03: qty 10

## 2018-09-03 MED ORDER — PROCHLORPERAZINE MALEATE 10 MG PO TABS
10.0000 mg | ORAL_TABLET | Freq: Once | ORAL | Status: AC
  Administered 2018-09-03: 10 mg via ORAL
  Filled 2018-09-03: qty 1

## 2018-09-03 NOTE — Progress Notes (Signed)
Pt in for follow up, reports improved appetite and feeling better.

## 2018-09-05 ENCOUNTER — Other Ambulatory Visit: Payer: Medicare Other | Admitting: Nurse Practitioner

## 2018-09-05 ENCOUNTER — Encounter: Payer: Self-pay | Admitting: Nurse Practitioner

## 2018-09-05 ENCOUNTER — Other Ambulatory Visit: Payer: Self-pay

## 2018-09-05 DIAGNOSIS — C787 Secondary malignant neoplasm of liver and intrahepatic bile duct: Secondary | ICD-10-CM | POA: Diagnosis not present

## 2018-09-05 DIAGNOSIS — C50912 Malignant neoplasm of unspecified site of left female breast: Secondary | ICD-10-CM | POA: Diagnosis not present

## 2018-09-05 DIAGNOSIS — C7951 Secondary malignant neoplasm of bone: Secondary | ICD-10-CM | POA: Diagnosis not present

## 2018-09-05 DIAGNOSIS — Z515 Encounter for palliative care: Secondary | ICD-10-CM

## 2018-09-05 DIAGNOSIS — R1310 Dysphagia, unspecified: Secondary | ICD-10-CM | POA: Diagnosis not present

## 2018-09-05 DIAGNOSIS — D630 Anemia in neoplastic disease: Secondary | ICD-10-CM | POA: Diagnosis not present

## 2018-09-05 DIAGNOSIS — E1142 Type 2 diabetes mellitus with diabetic polyneuropathy: Secondary | ICD-10-CM | POA: Diagnosis not present
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
## 2018-09-14 NOTE — Progress Notes (Signed)
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  INTERVAL HISTORY: Patient returns to clinic today for further evaluation and continuation of gemcitabine.  Other than being persistently cold, she feels well and her performance status continues to improve.  She has a good appetite and continues to gain weight. She denies any pain today.  She has chronic weakness and fatigue. Her chronic peripheral neuropathy is unchanged.  She has no other neurologic complaints.  She denies any recent fevers or illnesses.  She denies any chest pain, shortness of breath, cough, or hemoptysis.  She has no further issues with aspiration.  She denies any nausea, vomiting, constipation, or diarrhea. She has no urinary complaints.  Patient offers no further specific complaints today.  REVIEW OF SYSTEMS:   Review of Systems  Constitutional: Positive for malaise/fatigue. Negative for fever and weight loss.  Eyes: Negative.  Negative for blurred vision, double vision and pain.  Respiratory: Negative.  Negative for cough and shortness of breath.   Cardiovascular: Negative.  Negative for chest pain and leg swelling.  Gastrointestinal: Negative.  Negative for abdominal pain.  Genitourinary: Negative.  Negative for dysuria and flank pain.  Musculoskeletal: Negative for back pain and falls.  Skin: Negative.  Negative for rash.  Neurological: Positive for tingling, sensory change and weakness. Negative for focal weakness and headaches.  Endo/Heme/Allergies: Negative.   Psychiatric/Behavioral: Negative.  Negative for memory loss. The patient is not nervous/anxious and does not have insomnia.      As per HPI. Otherwise, a complete review of systems is negative.  PAST MEDICAL HISTORY: Past Medical History:  Diagnosis Date  . Anxiety   . Back pain    occasionally  . Breast cancer ([place]) 2009   left  . Depression    takes Paxil and Wellbutrin daily  . Diabetes ([place])    takes Metformin daily   type 2   . Genetic testing 02/03/2017   Multi-Cancer panel (83 genes) @ [facility] - No pathogenic mutations detected  . GERD (gastroesophageal reflux disease)    occasional  . History of bronchitis 2 yrs ago  . History of kidney stones   . Hyperlipidemia    takes Atorvastatin daily  . Hypertension    no meds  . Insomnia    takes Ambien nightly  . Insomnia    takes gabapentin nightly  . Joint pain   . Mood swings   . Osteoarthritis of knee   . Personal history of chemotherapy 12/05/2016   Mets from Breast Cancer  . Personal history of radiation therapy 11/2016  . Pneumonia   . PONV (postoperative nausea and vomiting)   . Seasonal allergies    takes Allegra daily    PAST SURGICAL HISTORY: Past Surgical History:  Procedure Laterality Date  . BREAST BIOPSY  2009  . cataract surgery Bilateral   . COLONOSCOPY    . IR RADIOLOGIST EVAL & MGMT  01/29/2018  . JOINT REPLACEMENT Left 2014   knee  . KNEE ARTHROSCOPY Left    x 5  . MASTECTOMY Left   . port a cath placed    . PORTACATH PLACEMENT N/A 09/17/2015   Procedure: INSERTION PORT-A-CATH;  Surgeon: Jules Husbands, MD;  Location: [facility]S;  Service: General;  Laterality: N/A;  . RADIOLOGY WITH ANESTHESIA N/A 02/20/2018   Procedure: CT WITH ANESTHESIA MICROWAVE THERMAL ABLATION-LIVER;  Surgeon: Jacqulynn Cadet, MD;  Location: [facility];  Service: Anesthesiology;  Laterality: N/A;  . TOOTH EXTRACTION    . TOTAL SHOULDER ARTHROPLASTY Right 06/03/2015   Procedure: TOTAL SHOULDER ARTHROPLASTY;  Surgeon: Tania Ade, MD;  Location: [facility];  Service: Orthopedics;  Laterality: Right;  Right total shoulder arthroplasty  . TOTAL  SHOULDER REPLACEMENT Right 06/03/2015  . TUBAL LIGATION      FAMILY HISTORY: Father with non-Hodgkin's lymphoma, 2 paternal aunts with breast cancer.     ADVANCED DIRECTIVES:    HEALTH MAINTENANCE: Social History   Tobacco Use  . Smoking status: Never Smoker  . Smokeless tobacco: Never Used  Substance Use Topics  . Alcohol use: Yes    Alcohol/week: 0.0 standard drinks    Comment: occasionally wine  . Drug use: Yes    Types: Marijuana    Comment: cannabis with no extra  low dose edibles  for neuropathy     Colonoscopy:  PAP:  Bone density:  Lipid panel:  Allergies  Allergen Reactions  . Ace Inhibitors Cough  . Latex Itching  . Morphine And Related Itching    Caused her to itch terribly. Would prefer if given to take with a benadryl  . Penicillins Rash    Has patient had a PCN reaction causing immediate rash, facial/tongue/throat swelling, SOB or lightheadedness with hypotension: No Has patient had a PCN reaction causing severe rash involving mucus membranes or skin necrosis: No Has patient had a PCN reaction that required hospitalization No Has patient had a PCN reaction occurring within the last 10 years: No If all of the above answers are "NO", then may proceed with Cephalosporin use.    Current Outpatient Medications  Medication Sig Dispense Refill  . acetaminophen (TYLENOL) 500 MG tablet Take 500 mg by mouth every 6 (six) hours as needed for mild pain or moderate pain.     Marland Kitchen atorvastatin (LIPITOR) 10 MG tablet Take 10 mg by mouth at bedtime.     Marland Kitchen buPROPion (WELLBUTRIN XL) 150 MG 24 hr tablet Take 150 mg by mouth at bedtime.     . Calcium-Magnesium-Vitamin D (CALCIUM 1200+D3 PO) Take 1 tablet by mouth daily.     . Cyanocobalamin 5000 MCG CAPS Take 5,000 mcg by mouth daily.     . fexofenadine (ALLEGRA) 180 MG tablet Take 180 mg by mouth at bedtime.     . metFORMIN (GLUCOPHAGE) 850 MG tablet Take 850 mg by mouth 2 (two) times daily with a meal.     . pantoprazole  (PROTONIX) 40 MG tablet Take 40 mg by mouth at bedtime.     Marland Kitchen PARoxetine (PAXIL) 40 MG tablet Take 40 mg by mouth at bedtime.     . prochlorperazine (COMPAZINE) 10 MG tablet TAKE 1 TABLET BY MOUTH EVERY 6 HOURS AS NEEDED FOR  NAUSEA OR  VOMITING 120 tablet 1  . sodium chloride (MURO 128) 5 % ophthalmic solution Place 1 drop into both eyes 4 (four) times daily.    Marland Kitchen zolpidem (AMBIEN) 10 MG tablet Take 1 tablet (10 mg total) by mouth at bedtime as needed (sleep). 90 tablet 0  . gabapentin (NEURONTIN) 300 MG capsule Take 300-600 mg by mouth 2 (two) times daily. Take 300 mg by mouth in the morning and 374m at night    . lidocaine-prilocaine (EMLA) cream Apply 1 application  topically as needed. Apply to port 1-2 hours prior to chemotherapy appointment. Cover with plastic wrap. (Patient not taking: Reported on 09/17/2018) 30 g 0  . Oxycodone HCl 10 MG TABS Take 1 tablet (10 mg total) by mouth every 4 (four) hours as needed. Ok to take every 4-6 hours as needed. (Patient not taking: Reported on 09/03/2018) 90 tablet 0   No current facility-administered medications for this visit.     OBJECTIVE: Vitals:   09/17/18 1011  BP: 122/77  Pulse: 92  Resp: 20  Temp: (!) 96.3 F (35.7 C)     Body mass index is 23.98 kg/m.    ECOG FS:2 - Symptomatic, <50% confined to bed  General: Thin, no acute distress.  Sitting in a wheelchair. Eyes: Pink conjunctiva, anicteric sclera. HEENT: Normocephalic, moist mucous membranes. Lungs: Clear to auscultation bilaterally. Heart: Regular rate and rhythm. No rubs, murmurs, or gallops. Abdomen: Soft, nontender, nondistended. No organomegaly noted, normoactive bowel sounds. Musculoskeletal: No edema, cyanosis, or clubbing. Neuro: Alert, answering all questions appropriately. Cranial nerves grossly intact. Skin: No rashes or petechiae noted. Psych: Normal affect.  LAB RESULTS:  Lab Results  Component Value Date   NA 139 09/17/2018   K 4.3 09/17/2018   CL 110  09/17/2018   CO2 23 09/17/2018   GLUCOSE 187 (H) 09/17/2018   BUN 14 09/17/2018   CREATININE 0.65 09/17/2018   CALCIUM 8.3 (L) 09/17/2018   PROT 6.0 (L) 09/17/2018   ALBUMIN 3.3 (L) 09/17/2018   AST 42 (H) 09/17/2018   ALT 18 09/17/2018   ALKPHOS 89 09/17/2018   BILITOT 0.5 09/17/2018   GFRNONAA >60 09/17/2018   GFRAA >60 09/17/2018    Lab Results  Component Value Date   WBC 4.9 09/17/2018   NEUTROABS 3.1 09/17/2018   HGB 9.5 (L) 09/17/2018   HCT 33.3 (L) 09/17/2018   MCV 88.6 09/17/2018   PLT 311 09/17/2018     STUDIES: No results found.  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  PLAN:    1.  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  There is no other obvious sites of disease. MRI of the brain on Jun 03, 2018 did not reveal metastatic disease.  Hospice and end-of-life care have been discussed on multiple occasions, but patient declined and wished to try additional chemotherapy.  She expressed understanding that there is likely no additional options after trying single agent gemcitabine.  Given patient's persistent pancytopenia, she will likely only be able to tolerate treatment every other week.  Proceed with cycle 8 of gemcitabine today.  Return to clinic in 2 weeks for further evaluation and consideration of cycle 9.  Will reimage in mid October 2020. 2.  Anemia: Patient's hemoglobin has trended up slightly and is now 9.5. 3.  Peripheral neuropathy: Chronic and unchanged.   4.  Back pain: MRI results from May 04, 2017 did not reveal metastatic disease.  Continue symptomatic treatment.  5.  Left IJ clot: Ultrasound results reviewed independently.  Patient has been instructed to discontinue Eliquis. 6.  Neutropenia: Resolved. 7.  Memory  complaints/confusion: Repeat MRI of the brain on Jun 03, 2018 reviewed independently with no obvious intracranial abnormality.  Patient continues to have progressive and persistent osseous metastatic disease. 8.  Elevated liver enzymes: Nearly resolved.  AST remains mildly  elevated at 42.  Gemcitabine does not need to be dose reduced in the setting of liver dysfunction. 9.  Apparent aspiration: Resolved. 10.  Poor appetite: Improved.  Patient is now gaining weight. 11.  Insomnia: Improved.  Continue Ambien as prescribed.   12.  Bilateral lung opacities: Essentially resolved.  Patient has completed a 14-day course of Levaquin.  Patient expressed understanding and was in agreement with this plan. She also understands that She can call clinic at any time with any questions, concerns, or complaints.   Breast cancer   Staging form: Breast, AJCC 7th Edition     Pathologic stage from 08/11/2014: Stage IIIA (T0, N2a, cM0) - Signed by Lloyd Huger, MD on 08/11/2014   Lloyd Huger, MD   09/17/2018 12:56 PM

## 2018-09-17 ENCOUNTER — Other Ambulatory Visit: Payer: Self-pay

## 2018-09-17 ENCOUNTER — Encounter: Payer: Self-pay | Admitting: Oncology

## 2018-09-17 ENCOUNTER — Inpatient Hospital Stay: Payer: Medicare Other | Attending: Oncology

## 2018-09-17 ENCOUNTER — Inpatient Hospital Stay: Payer: Medicare Other

## 2018-09-17 ENCOUNTER — Inpatient Hospital Stay (HOSPITAL_BASED_OUTPATIENT_CLINIC_OR_DEPARTMENT_OTHER): Payer: Medicare Other | Admitting: Oncology

## 2018-09-17 VITALS — BP 122/77 | HR 92 | Temp 96.3°F | Resp 20 | Ht 64.0 in | Wt 139.7 lb

## 2018-09-17 DIAGNOSIS — C7951 Secondary malignant neoplasm of bone: Secondary | ICD-10-CM | POA: Diagnosis not present

## 2018-09-17 DIAGNOSIS — C787 Secondary malignant neoplasm of liver and intrahepatic bile duct: Secondary | ICD-10-CM | POA: Insufficient documentation

## 2018-09-17 DIAGNOSIS — C50912 Malignant neoplasm of unspecified site of left female breast: Secondary | ICD-10-CM

## 2018-09-17 DIAGNOSIS — Z5111 Encounter for antineoplastic chemotherapy: Secondary | ICD-10-CM | POA: Diagnosis not present

## 2018-09-17 DIAGNOSIS — C50919 Malignant neoplasm of unspecified site of unspecified female breast: Secondary | ICD-10-CM | POA: Insufficient documentation

## 2018-09-17 LAB — COMPREHENSIVE METABOLIC PANEL
ALT: 18 U/L (ref 0–44)
AST: 42 U/L — ABNORMAL HIGH (ref 15–41)
Albumin: 3.3 g/dL — ABNORMAL LOW (ref 3.5–5.0)
Alkaline Phosphatase: 89 U/L (ref 38–126)
Anion gap: 6 (ref 5–15)
BUN: 14 mg/dL (ref 8–23)
CO2: 23 mmol/L (ref 22–32)
Calcium: 8.3 mg/dL — ABNORMAL LOW (ref 8.9–10.3)
Chloride: 110 mmol/L (ref 98–111)
Creatinine, Ser: 0.65 mg/dL (ref 0.44–1.00)
GFR calc Af Amer: >60 mL/min (ref >60)
GFR calc non Af Amer: >60 mL/min (ref >60)
Glucose, Bld: 187 mg/dL — ABNORMAL HIGH (ref 70–99)
Potassium: 4.3 mmol/L (ref 3.5–5.1)
Sodium: 139 mmol/L (ref 135–145)
Total Bilirubin: 0.5 mg/dL (ref 0.3–1.2)
Total Protein: 6 g/dL — ABNORMAL LOW (ref 6.5–8.1)

## 2018-09-17 LAB — SAMPLE TO BLOOD BANK

## 2018-09-17 LAB — CBC WITH DIFFERENTIAL/PLATELET
Abs Immature Granulocytes: 0.03 10*3/uL (ref 0.00–0.07)
Basophils Absolute: 0 10*3/uL (ref 0.0–0.1)
Basophils Relative: 1 %
Eosinophils Absolute: 0.1 10*3/uL (ref 0.0–0.5)
Eosinophils Relative: 1 %
HCT: 33.3 % — ABNORMAL LOW (ref 36.0–46.0)
Hemoglobin: 9.5 g/dL — ABNORMAL LOW (ref 12.0–15.0)
Immature Granulocytes: 1 %
Lymphocytes Relative: 21 %
Lymphs Abs: 1.1 10*3/uL (ref 0.7–4.0)
MCH: 25.3 pg — ABNORMAL LOW (ref 26.0–34.0)
MCHC: 28.5 g/dL — ABNORMAL LOW (ref 30.0–36.0)
MCV: 88.6 fL (ref 80.0–100.0)
Monocytes Absolute: 0.7 10*3/uL (ref 0.1–1.0)
Monocytes Relative: 14 %
Neutro Abs: 3.1 10*3/uL (ref 1.7–7.7)
Neutrophils Relative %: 62 %
Platelets: 311 10*3/uL (ref 150–400)
RBC: 3.76 MIL/uL — ABNORMAL LOW (ref 3.87–5.11)
RDW: 20.5 % — ABNORMAL HIGH (ref 11.5–15.5)
WBC: 4.9 10*3/uL (ref 4.0–10.5)
nRBC: 0 % (ref 0.0–0.2)

## 2018-09-17 MED ORDER — HEPARIN SOD (PORK) LOCK FLUSH 100 UNIT/ML IV SOLN
500.0000 [IU] | Freq: Once | INTRAVENOUS | Status: AC | PRN
  Administered 2018-09-17: 500 [IU]
  Filled 2018-09-17: qty 5

## 2018-09-17 MED ORDER — SODIUM CHLORIDE 0.9% FLUSH
10.0000 mL | Freq: Once | INTRAVENOUS | Status: AC
  Administered 2018-09-17: 10 mL via INTRAVENOUS
  Filled 2018-09-17: qty 10

## 2018-09-17 MED ORDER — SODIUM CHLORIDE 0.9 % IV SOLN
Freq: Once | INTRAVENOUS | Status: AC
  Administered 2018-09-17: 11:00:00 via INTRAVENOUS
  Filled 2018-09-17: qty 250

## 2018-09-17 MED ORDER — PROCHLORPERAZINE MALEATE 10 MG PO TABS
10.0000 mg | ORAL_TABLET | Freq: Once | ORAL | Status: AC
  Administered 2018-09-17: 10 mg via ORAL
  Filled 2018-09-17: qty 1

## 2018-09-17 MED ORDER — SODIUM CHLORIDE 0.9 % IV SOLN
1400.0000 mg | Freq: Once | INTRAVENOUS | Status: AC
  Administered 2018-09-17: 12:00:00 1400 mg via INTRAVENOUS
  Filled 2018-09-17: qty 26.3

## 2018-09-17 MED ORDER — ZOLEDRONIC ACID 4 MG/100ML IV SOLN
4.0000 mg | Freq: Once | INTRAVENOUS | Status: AC
  Administered 2018-09-17: 4 mg via INTRAVENOUS
  Filled 2018-09-17: qty 100

## 2018-09-17 NOTE — Progress Notes (Signed)
Here today for infusion. Reports no new complaints.

## 2018-09-20 NOTE — Progress Notes (Signed)
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  INTERVAL HISTORY: Patient returns to clinic today for further evaluation and continuation of gemcitabine.  She continues to have chronic weakness and fatigue, but otherwise feels well. She has a good appetite and continues to gain weight. She denies any pain today. Her chronic peripheral neuropathy is unchanged.  She has no other neurologic complaints.  She denies any recent fevers or illnesses.  She denies any chest pain, shortness of breath, cough, or hemoptysis.  She denies any nausea, vomiting, constipation, or diarrhea.  She has no urinary complaints.  Patient offers no specific complaints today.  REVIEW OF SYSTEMS:   Review of Systems  Constitutional: Positive for malaise/fatigue. Negative for fever and weight loss.  Eyes: Negative.  Negative for blurred vision, double vision and pain.  Respiratory: Negative.  Negative for cough and shortness of breath.   Cardiovascular: Negative.  Negative for chest pain and leg swelling.  Gastrointestinal: Negative.  Negative for abdominal pain.  Genitourinary: Negative.  Negative for dysuria and flank pain.  Musculoskeletal: Negative for back pain and falls.  Skin: Negative.  Negative for rash.  Neurological: Positive for tingling, sensory change and weakness. Negative for focal weakness and headaches.  Endo/Heme/Allergies: Negative.   Psychiatric/Behavioral: Negative.  Negative for memory loss. The patient is not nervous/anxious and does not have insomnia.     As per HPI. Otherwise, a complete review of systems is negative.  PAST MEDICAL HISTORY: Past Medical  History:  Diagnosis Date  . Anxiety   . Back pain    occasionally  . Breast cancer ([place]) 2009   left  . Depression    takes Paxil and Wellbutrin daily  . Diabetes ([place])    takes Metformin daily   type 2   . Genetic testing 02/03/2017   Multi-Cancer panel (83 genes) @ [facility] - No pathogenic mutations detected  . GERD (gastroesophageal reflux disease)    occasional  . History of bronchitis 2 yrs ago  . History of kidney stones   . Hyperlipidemia    takes Atorvastatin daily  . Hypertension    no meds  . Insomnia    takes Ambien nightly  . Insomnia    takes gabapentin nightly  . Joint pain   . Mood swings   . Osteoarthritis of knee   . Personal history of chemotherapy 12/05/2016   Mets from Breast Cancer  . Personal history of radiation therapy 11/2016  . Pneumonia   . PONV (postoperative nausea and vomiting)   . Seasonal allergies    takes Allegra daily    PAST SURGICAL HISTORY: Past Surgical History:  Procedure Laterality Date  . BREAST BIOPSY  2009  . cataract surgery Bilateral   . COLONOSCOPY    . IR RADIOLOGIST EVAL & MGMT  01/29/2018  . JOINT REPLACEMENT Left 2014   knee  . KNEE ARTHROSCOPY Left    x 5  . MASTECTOMY Left   . port a cath placed    . PORTACATH PLACEMENT N/A 09/17/2015   Procedure: INSERTION PORT-A-CATH;  Surgeon: Jules Husbands, MD;  Location: [facility];  Service: General;  Laterality: N/A;  . RADIOLOGY WITH ANESTHESIA N/A 02/20/2018   Procedure:  CT WITH ANESTHESIA MICROWAVE THERMAL ABLATION-LIVER;  Surgeon: Jacqulynn Cadet, MD;  Location: [facility];  Service: Anesthesiology;  Laterality: N/A;  . TOOTH EXTRACTION    . TOTAL SHOULDER ARTHROPLASTY Right 06/03/2015   Procedure: TOTAL SHOULDER ARTHROPLASTY;  Surgeon: Tania Ade, MD;  Location: [facility];  Service: Orthopedics;  Laterality: Right;  Right total shoulder arthroplasty  . TOTAL SHOULDER REPLACEMENT Right 06/03/2015  . TUBAL LIGATION      FAMILY HISTORY: Father with non-Hodgkin's lymphoma,  2 paternal aunts with breast cancer.     ADVANCED DIRECTIVES:    HEALTH MAINTENANCE: Social History   Tobacco Use  . Smoking status: Never Smoker  . Smokeless tobacco: Never Used  Substance Use Topics  . Alcohol use: Yes    Alcohol/week: 0.0 standard drinks    Comment: occasionally wine  . Drug use: Yes    Types: Marijuana    Comment: cannabis with no extra  low dose edibles  for neuropathy     Colonoscopy:  PAP:  Bone density:  Lipid panel:  Allergies  Allergen Reactions  . Ace Inhibitors Cough  . Latex Itching  . Morphine And Related Itching    Caused her to itch terribly. Would prefer if given to take with a benadryl  . Penicillins Rash    Has patient had a PCN reaction causing immediate rash, facial/tongue/throat swelling, SOB or lightheadedness with hypotension: No Has patient had a PCN reaction causing severe rash involving mucus membranes or skin necrosis: No Has patient had a PCN reaction that required hospitalization No Has patient had a PCN reaction occurring within the last 10 years: No If all of the above answers are "NO", then may proceed with Cephalosporin use.    Current Outpatient Medications  Medication Sig Dispense Refill  . acetaminophen (TYLENOL) 500 MG tablet Take 500 mg by mouth every 6 (six) hours as needed for mild pain or moderate pain.     Marland Kitchen atorvastatin (LIPITOR) 10 MG tablet Take 10 mg by mouth at bedtime.     Marland Kitchen buPROPion (WELLBUTRIN XL) 150 MG 24 hr tablet Take 150 mg by mouth at bedtime.     . Calcium-Magnesium-Vitamin D (CALCIUM 1200+D3 PO) Take 1 tablet by mouth daily.     . fexofenadine (ALLEGRA) 180 MG tablet Take 180 mg by mouth at bedtime.     . lidocaine-prilocaine (EMLA) cream Apply 1 application topically as needed. Apply to port 1-2 hours prior to chemotherapy appointment. Cover with plastic wrap. 30 g 0  . metFORMIN (GLUCOPHAGE) 850 MG tablet Take 850 mg by mouth 2 (two) times daily with a meal.     . Oxycodone HCl 10 MG TABS  Take 1 tablet (10 mg total) by mouth every 4 (four) hours as needed. Ok to take every 4-6 hours as needed. 90 tablet 0  . pantoprazole (PROTONIX) 40 MG tablet Take 40 mg by mouth at bedtime.     Marland Kitchen PARoxetine (PAXIL) 40 MG tablet Take 40 mg by mouth at bedtime.     . prochlorperazine (COMPAZINE) 10 MG tablet TAKE 1 TABLET BY MOUTH EVERY 6 HOURS AS NEEDED FOR  NAUSEA OR  VOMITING 120 tablet 1  . sodium chloride (MURO 128) 5 % ophthalmic solution Place 1 drop into both eyes 4 (four) times daily.    Marland Kitchen zolpidem (AMBIEN) 10 MG tablet Take 1 tablet (10 mg total) by mouth at bedtime as needed (sleep). 90 tablet 0   No current facility-administered medications for this visit.  OBJECTIVE: Vitals:   10/01/18 1038  BP: 110/86  Pulse: 82  Temp: (!) 95.9 F (35.5 C)     Body mass index is 22.83 kg/m.    ECOG FS:2 - Symptomatic, <50% confined to bed  General: Thin, no acute distress.  Sitting in wheelchair. Eyes: Pink conjunctiva, anicteric sclera. HEENT: Normocephalic, moist mucous membranes. Lungs: Clear to auscultation bilaterally. Heart: Regular rate and rhythm. No rubs, murmurs, or gallops. Abdomen: Soft, nontender, nondistended. No organomegaly noted, normoactive bowel sounds. Musculoskeletal: No edema, cyanosis, or clubbing. Neuro: Alert, answering all questions appropriately. Cranial nerves grossly intact. Skin: No rashes or petechiae noted. Psych: Normal affect.  LAB RESULTS:  Lab Results  Component Value Date   NA 139 10/01/2018   K 4.8 10/01/2018   CL 107 10/01/2018   CO2 25 10/01/2018   GLUCOSE 175 (H) 10/01/2018   BUN 14 10/01/2018   CREATININE 0.59 10/01/2018   CALCIUM 8.4 (L) 10/01/2018   PROT 6.0 (L) 10/01/2018   ALBUMIN 3.2 (L) 10/01/2018   AST 41 10/01/2018   ALT 19 10/01/2018   ALKPHOS 106 10/01/2018   BILITOT 0.4 10/01/2018   GFRNONAA >60 10/01/2018   GFRAA >60 10/01/2018    Lab Results  Component Value Date   WBC 5.3 10/01/2018   NEUTROABS 3.7  10/01/2018   HGB 10.1 (L) 10/01/2018   HCT 35.1 (L) 10/01/2018   MCV 89.8 10/01/2018   PLT 313 10/01/2018     STUDIES: No results found.  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  PLAN:    1.  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  There is no other obvious sites of disease. MRI of the brain on Jun 03, 2018 did not reveal metastatic disease.  Hospice and end-of-life care have been discussed on multiple occasions, but patient declined and wished to try additional chemotherapy.  She expressed understanding that there is likely no additional options after trying single agent gemcitabine.  Given patient's persistent pancytopenia, she will likely only be able to tolerate treatment every other week.  Proceed with cycle 9 of gemcitabine today.  Return to clinic in 2 weeks for consideration of cycle 10 and evaluation by nurse practitioner.  Patient will then return to clinic in 4 weeks for further evaluation and continuation of treatment.  Will reimage with CT scans prior to her appointment in 4 weeks.  2.  Anemia: Hemoglobin continues to slowly trend up and is now 10.1. 3.  Peripheral neuropathy: Chronic and unchanged.   4.  Back pain: MRI results from May 04, 2017 did not reveal metastatic disease.  Continue symptomatic treatment.  5.  Left IJ clot: Ultrasound results reviewed independently.  Patient has been instructed to discontinue Eliquis. 6.  Neutropenia: Resolved. 7.  Memory complaints/confusion: Repeat MRI of the brain on Jun 03, 2018 reviewed independently with no obvious intracranial abnormality.  Patient continues to have progressive and persistent osseous metastatic disease. 8.  Elevated liver enzymes: Resolved. 9.  Apparent aspiration: Resolved.  10.  Poor appetite: Improved.  Patient is now gaining weight. 11.  Insomnia: Improved.  Continue Ambien as prescribed.   12.  Bilateral lung opacities: Essentially resolved.  Patient has completed a 14-day course of Levaquin.  Patient expressed understanding and was in agreement with this plan. She also understands that She can  call clinic at any time with any questions, concerns, or complaints.   Breast cancer   Staging form: Breast, AJCC 7th Edition     Pathologic stage from 08/11/2014: Stage IIIA (T0, N2a, cM0) - Signed by Lloyd Huger, MD on 08/11/2014   Lloyd Huger, MD   10/01/2018 1:20 PM

## 2018-10-01 ENCOUNTER — Inpatient Hospital Stay: Payer: Medicare Other

## 2018-10-01 ENCOUNTER — Encounter: Payer: Self-pay | Admitting: Oncology

## 2018-10-01 ENCOUNTER — Other Ambulatory Visit: Payer: Self-pay

## 2018-10-01 ENCOUNTER — Inpatient Hospital Stay (HOSPITAL_BASED_OUTPATIENT_CLINIC_OR_DEPARTMENT_OTHER): Payer: Medicare Other | Admitting: Oncology

## 2018-10-01 VITALS — BP 110/86 | HR 82 | Temp 95.9°F | Ht 64.0 in | Wt 133.0 lb

## 2018-10-01 DIAGNOSIS — C50919 Malignant neoplasm of unspecified site of unspecified female breast: Secondary | ICD-10-CM

## 2018-10-01 DIAGNOSIS — Z5111 Encounter for antineoplastic chemotherapy: Secondary | ICD-10-CM | POA: Diagnosis not present

## 2018-10-01 DIAGNOSIS — C7951 Secondary malignant neoplasm of bone: Secondary | ICD-10-CM | POA: Diagnosis not present

## 2018-10-01 DIAGNOSIS — Z95828 Presence of other vascular implants and grafts: Secondary | ICD-10-CM

## 2018-10-01 DIAGNOSIS — C787 Secondary malignant neoplasm of liver and intrahepatic bile duct: Secondary | ICD-10-CM | POA: Diagnosis not present

## 2018-10-01 LAB — CBC WITH DIFFERENTIAL/PLATELET
Abs Immature Granulocytes: 0.04 10*3/uL (ref 0.00–0.07)
Basophils Absolute: 0 10*3/uL (ref 0.0–0.1)
Basophils Relative: 1 %
Eosinophils Absolute: 0.1 10*3/uL (ref 0.0–0.5)
Eosinophils Relative: 1 %
HCT: 35.1 % — ABNORMAL LOW (ref 36.0–46.0)
Hemoglobin: 10.1 g/dL — ABNORMAL LOW (ref 12.0–15.0)
Immature Granulocytes: 1 %
Lymphocytes Relative: 13 %
Lymphs Abs: 0.7 10*3/uL (ref 0.7–4.0)
MCH: 25.8 pg — ABNORMAL LOW (ref 26.0–34.0)
MCHC: 28.8 g/dL — ABNORMAL LOW (ref 30.0–36.0)
MCV: 89.8 fL (ref 80.0–100.0)
Monocytes Absolute: 0.8 10*3/uL (ref 0.1–1.0)
Monocytes Relative: 15 %
Neutro Abs: 3.7 10*3/uL (ref 1.7–7.7)
Neutrophils Relative %: 69 %
Platelets: 313 10*3/uL (ref 150–400)
RBC: 3.91 MIL/uL (ref 3.87–5.11)
RDW: 21.4 % — ABNORMAL HIGH (ref 11.5–15.5)
WBC: 5.3 10*3/uL (ref 4.0–10.5)
nRBC: 0 % (ref 0.0–0.2)

## 2018-10-01 LAB — COMPREHENSIVE METABOLIC PANEL
ALT: 19 U/L (ref 0–44)
AST: 41 U/L (ref 15–41)
Albumin: 3.2 g/dL — ABNORMAL LOW (ref 3.5–5.0)
Alkaline Phosphatase: 106 U/L (ref 38–126)
Anion gap: 7 (ref 5–15)
BUN: 14 mg/dL (ref 8–23)
CO2: 25 mmol/L (ref 22–32)
Calcium: 8.4 mg/dL — ABNORMAL LOW (ref 8.9–10.3)
Chloride: 107 mmol/L (ref 98–111)
Creatinine, Ser: 0.59 mg/dL (ref 0.44–1.00)
GFR calc Af Amer: >60 mL/min (ref >60)
GFR calc non Af Amer: >60 mL/min (ref >60)
Glucose, Bld: 175 mg/dL — ABNORMAL HIGH (ref 70–99)
Potassium: 4.8 mmol/L (ref 3.5–5.1)
Sodium: 139 mmol/L (ref 135–145)
Total Bilirubin: 0.4 mg/dL (ref 0.3–1.2)
Total Protein: 6 g/dL — ABNORMAL LOW (ref 6.5–8.1)

## 2018-10-01 MED ORDER — SODIUM CHLORIDE 0.9 % IV SOLN
Freq: Once | INTRAVENOUS | Status: AC
  Administered 2018-10-01: 11:00:00 via INTRAVENOUS
  Filled 2018-10-01: qty 250

## 2018-10-01 MED ORDER — PROCHLORPERAZINE MALEATE 10 MG PO TABS
10.0000 mg | ORAL_TABLET | Freq: Once | ORAL | Status: AC
  Administered 2018-10-01: 10 mg via ORAL
  Filled 2018-10-01: qty 1

## 2018-10-01 MED ORDER — SODIUM CHLORIDE 0.9 % IV SOLN
1400.0000 mg | Freq: Once | INTRAVENOUS | Status: AC
  Administered 2018-10-01: 1400 mg via INTRAVENOUS
  Filled 2018-10-01: qty 26.3

## 2018-10-01 MED ORDER — SODIUM CHLORIDE 0.9% FLUSH
10.0000 mL | Freq: Once | INTRAVENOUS | Status: AC
  Administered 2018-10-01: 10 mL via INTRAVENOUS
  Filled 2018-10-01: qty 10

## 2018-10-01 MED ORDER — HEPARIN SOD (PORK) LOCK FLUSH 100 UNIT/ML IV SOLN
500.0000 [IU] | Freq: Once | INTRAVENOUS | Status: AC | PRN
  Administered 2018-10-01: 500 [IU]
  Filled 2018-10-01: qty 5

## 2018-10-14 ENCOUNTER — Encounter: Payer: Self-pay | Admitting: Oncology

## 2018-10-14 ENCOUNTER — Other Ambulatory Visit: Payer: Self-pay

## 2018-10-14 ENCOUNTER — Other Ambulatory Visit: Payer: Medicare Other | Admitting: Nurse Practitioner

## 2018-10-14 ENCOUNTER — Encounter: Payer: Self-pay | Admitting: Nurse Practitioner

## 2018-10-14 DIAGNOSIS — Z515 Encounter for palliative care: Secondary | ICD-10-CM | POA: Diagnosis not present
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
## 2018-10-14 NOTE — Progress Notes (Signed)
Patient stated that she had been doing well. Patient stated that her appetite is doing better.

## 2018-10-15 ENCOUNTER — Inpatient Hospital Stay: Payer: Medicare Other

## 2018-10-15 ENCOUNTER — Other Ambulatory Visit: Payer: Self-pay

## 2018-10-15 ENCOUNTER — Inpatient Hospital Stay (HOSPITAL_BASED_OUTPATIENT_CLINIC_OR_DEPARTMENT_OTHER): Payer: Medicare Other | Admitting: Oncology

## 2018-10-15 VITALS — BP 103/72 | HR 94 | Temp 95.9°F | Ht 64.0 in | Wt 138.6 lb

## 2018-10-15 DIAGNOSIS — Z853 Personal history of malignant neoplasm of breast: Secondary | ICD-10-CM

## 2018-10-15 DIAGNOSIS — C50919 Malignant neoplasm of unspecified site of unspecified female breast: Secondary | ICD-10-CM

## 2018-10-15 DIAGNOSIS — C787 Secondary malignant neoplasm of liver and intrahepatic bile duct: Secondary | ICD-10-CM | POA: Diagnosis not present

## 2018-10-15 DIAGNOSIS — Z5111 Encounter for antineoplastic chemotherapy: Secondary | ICD-10-CM | POA: Diagnosis not present

## 2018-10-15 DIAGNOSIS — C7951 Secondary malignant neoplasm of bone: Secondary | ICD-10-CM | POA: Diagnosis not present

## 2018-10-15 LAB — COMPREHENSIVE METABOLIC PANEL
ALT: 15 U/L (ref 0–44)
ALT: 20 U/L (ref 0–44)
AST: 33 U/L (ref 15–41)
AST: 47 U/L — ABNORMAL HIGH (ref 15–41)
Albumin: 2.3 g/dL — ABNORMAL LOW (ref 3.5–5.0)
Albumin: 3.4 g/dL — ABNORMAL LOW (ref 3.5–5.0)
Alkaline Phosphatase: 75 U/L (ref 38–126)
Alkaline Phosphatase: 111 U/L (ref 38–126)
Anion gap: 7 (ref 5–15)
Anion gap: 8 (ref 5–15)
BUN: 11 mg/dL (ref 8–23)
BUN: 15 mg/dL (ref 8–23)
CO2: 18 mmol/L — ABNORMAL LOW (ref 22–32)
CO2: 25 mmol/L (ref 22–32)
Calcium: 6.3 mg/dL — CL (ref 8.9–10.3)
Calcium: 8.7 mg/dL — ABNORMAL LOW (ref 8.9–10.3)
Chloride: 116 mmol/L — ABNORMAL HIGH (ref 98–111)
Chloride: 106 mmol/L (ref 98–111)
Creatinine, Ser: 0.44 mg/dL (ref 0.44–1.00)
Creatinine, Ser: 0.69 mg/dL (ref 0.44–1.00)
GFR calc Af Amer: >60 mL/min (ref >60)
GFR calc Af Amer: >60 mL/min (ref >60)
GFR calc non Af Amer: >60 mL/min (ref >60)
GFR calc non Af Amer: >60 mL/min (ref >60)
Glucose, Bld: 143 mg/dL — ABNORMAL HIGH (ref 70–99)
Glucose, Bld: 147 mg/dL — ABNORMAL HIGH (ref 70–99)
Potassium: 3.2 mmol/L — ABNORMAL LOW (ref 3.5–5.1)
Potassium: 4.3 mmol/L (ref 3.5–5.1)
Sodium: 141 mmol/L (ref 135–145)
Sodium: 139 mmol/L (ref 135–145)
Total Bilirubin: 0.4 mg/dL (ref 0.3–1.2)
Total Bilirubin: 0.4 mg/dL (ref 0.3–1.2)
Total Protein: 4.4 g/dL — ABNORMAL LOW (ref 6.5–8.1)
Total Protein: 6.4 g/dL — ABNORMAL LOW (ref 6.5–8.1)

## 2018-10-15 LAB — CBC WITH DIFFERENTIAL/PLATELET
Abs Immature Granulocytes: 0.05 10*3/uL (ref 0.00–0.07)
Basophils Absolute: 0 10*3/uL (ref 0.0–0.1)
Basophils Relative: 1 %
Eosinophils Absolute: 0.2 10*3/uL (ref 0.0–0.5)
Eosinophils Relative: 3 %
HCT: 34.5 % — ABNORMAL LOW (ref 36.0–46.0)
Hemoglobin: 10.3 g/dL — ABNORMAL LOW (ref 12.0–15.0)
Immature Granulocytes: 1 %
Lymphocytes Relative: 15 %
Lymphs Abs: 0.8 10*3/uL (ref 0.7–4.0)
MCH: 26.7 pg (ref 26.0–34.0)
MCHC: 29.9 g/dL — ABNORMAL LOW (ref 30.0–36.0)
MCV: 89.4 fL (ref 80.0–100.0)
Monocytes Absolute: 0.9 10*3/uL (ref 0.1–1.0)
Monocytes Relative: 18 %
Neutro Abs: 3.2 10*3/uL (ref 1.7–7.7)
Neutrophils Relative %: 62 %
Platelets: 283 10*3/uL (ref 150–400)
RBC: 3.86 MIL/uL — ABNORMAL LOW (ref 3.87–5.11)
RDW: 22.1 % — ABNORMAL HIGH (ref 11.5–15.5)
WBC: 5.2 10*3/uL (ref 4.0–10.5)
nRBC: 0 % (ref 0.0–0.2)

## 2018-10-15 MED ORDER — PROCHLORPERAZINE MALEATE 10 MG PO TABS
10.0000 mg | ORAL_TABLET | Freq: Once | ORAL | Status: AC
  Administered 2018-10-15: 10 mg via ORAL
  Filled 2018-10-15: qty 1

## 2018-10-15 MED ORDER — HEPARIN SOD (PORK) LOCK FLUSH 100 UNIT/ML IV SOLN
500.0000 [IU] | Freq: Once | INTRAVENOUS | Status: AC | PRN
  Administered 2018-10-15: 500 [IU]
  Filled 2018-10-15: qty 5

## 2018-10-15 MED ORDER — SODIUM CHLORIDE 0.9 % IV SOLN
Freq: Once | INTRAVENOUS | Status: AC
  Administered 2018-10-15: 13:00:00 via INTRAVENOUS
  Filled 2018-10-15: qty 250

## 2018-10-15 MED ORDER — SODIUM CHLORIDE 0.9 % IV SOLN
1400.0000 mg | Freq: Once | INTRAVENOUS | Status: AC
  Administered 2018-10-15: 1400 mg via INTRAVENOUS
  Filled 2018-10-15: qty 26.3

## 2018-10-15 MED ORDER — SODIUM CHLORIDE 0.9% FLUSH
10.0000 mL | Freq: Once | INTRAVENOUS | Status: AC
  Administered 2018-10-15: 10:00:00 10 mL via INTRAVENOUS
  Filled 2018-10-15: qty 10

## 2018-10-15 NOTE — Progress Notes (Signed)
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  INTERVAL HISTORY: Patient returns to clinic today for further evaluation and continuation of gemcitabine.  Today, she feels "great".  She has intermittent weakness but overall feels improved from previous visits.  Her appetite is great.  She eats 3 meals per day with snacks in between.  She denies any pain, nausea or vomiting.  She denies any recent fevers or illnesses.  She has no chest pain, shortness of breath cough or hemoptysis.  She denies any nausea, vomiting, constipation or diarrhea.  She denies any urinary complaints.   REVIEW OF SYSTEMS:   Review of Systems  Constitutional: Positive for malaise/fatigue (Intermittent). Negative for chills, fever and weight loss.  HENT: Negative for congestion, ear pain and tinnitus.   Eyes: Negative.  Negative for blurred vision and double vision.  Respiratory: Negative.  Negative for cough, sputum production and shortness of breath.   Cardiovascular: Negative.  Negative for chest pain, palpitations and leg swelling.  Gastrointestinal: Negative.  Negative for abdominal pain, constipation, diarrhea, nausea and vomiting.  Genitourinary: Negative for dysuria, frequency and urgency.  Musculoskeletal: Negative for back pain and falls.  Skin: Negative.  Negative for rash.  Neurological: Positive for weakness. Negative for headaches.  Endo/Heme/Allergies: Negative.  Does not bruise/bleed easily.  Psychiatric/Behavioral: Negative.  Negative for depression. The patient is not nervous/anxious and does not have insomnia.     As per HPI. Otherwise, a complete review of  systems is negative.  PAST MEDICAL HISTORY: Past Medical History:  Diagnosis Date  . Anxiety   . Back pain    occasionally  . Breast cancer ([place]) 2009   left  . Depression    takes Paxil and Wellbutrin daily  . Diabetes ([place])    takes Metformin daily   type 2   . Genetic testing 02/03/2017   Multi-Cancer panel (83 genes) @ [facility] - No pathogenic mutations detected  . GERD (gastroesophageal reflux disease)    occasional  . History of bronchitis 2 yrs ago  . History of kidney stones   . Hyperlipidemia    takes Atorvastatin daily  . Hypertension    no meds  . Insomnia    takes Ambien nightly  . Insomnia    takes gabapentin nightly  . Joint pain   . Mood swings   . Osteoarthritis of knee   . Personal history of chemotherapy 12/05/2016   Mets from Breast Cancer  . Personal history of radiation therapy 11/2016  . Pneumonia   . PONV (postoperative nausea and vomiting)   . Seasonal allergies    takes Allegra daily    PAST SURGICAL HISTORY: Past Surgical History:  Procedure Laterality Date  . BREAST BIOPSY  2009  . cataract surgery Bilateral   . COLONOSCOPY    . IR RADIOLOGIST EVAL & MGMT  01/29/2018  . JOINT REPLACEMENT Left 2014   knee  . KNEE ARTHROSCOPY Left    x 5  . MASTECTOMY Left   . port a cath placed    . PORTACATH PLACEMENT N/A 09/17/2015   Procedure: INSERTION PORT-A-CATH;  Surgeon: Jules Husbands, MD;  Location: [facility];  Service: General;  Laterality: N/A;  . RADIOLOGY  WITH ANESTHESIA N/A 02/20/2018   Procedure: CT WITH ANESTHESIA MICROWAVE THERMAL ABLATION-LIVER;  Surgeon: Jacqulynn Cadet, MD;  Location: [facility];  Service: Anesthesiology;  Laterality: N/A;  . TOOTH EXTRACTION    . TOTAL SHOULDER ARTHROPLASTY Right 06/03/2015   Procedure: TOTAL SHOULDER ARTHROPLASTY;  Surgeon: Tania Ade, MD;  Location: [facility];  Service: Orthopedics;  Laterality: Right;  Right total shoulder arthroplasty  . TOTAL SHOULDER REPLACEMENT Right 06/03/2015  . TUBAL LIGATION       FAMILY HISTORY: Father with non-Hodgkin's lymphoma, 2 paternal aunts with breast cancer.     ADVANCED DIRECTIVES:    HEALTH MAINTENANCE: Social History   Tobacco Use  . Smoking status: Never Smoker  . Smokeless tobacco: Never Used  Substance Use Topics  . Alcohol use: Yes    Alcohol/week: 0.0 standard drinks    Comment: occasionally wine  . Drug use: Yes    Types: Marijuana    Comment: cannabis with no extra  low dose edibles  for neuropathy     Colonoscopy:  PAP:  Bone density:  Lipid panel:  Allergies  Allergen Reactions  . Ace Inhibitors Cough  . Latex Itching  . Morphine And Related Itching    Caused her to itch terribly. Would prefer if given to take with a benadryl  . Penicillins Rash    Has patient had a PCN reaction causing immediate rash, facial/tongue/throat swelling, SOB or lightheadedness with hypotension: No Has patient had a PCN reaction causing severe rash involving mucus membranes or skin necrosis: No Has patient had a PCN reaction that required hospitalization No Has patient had a PCN reaction occurring within the last 10 years: No If all of the above answers are "NO", then may proceed with Cephalosporin use.    Current Outpatient Medications  Medication Sig Dispense Refill  . fexofenadine (ALLEGRA) 180 MG tablet Take 180 mg by mouth at bedtime.     . lidocaine-prilocaine (EMLA) cream Apply 1 application topically as needed. Apply to port 1-2 hours prior to chemotherapy appointment. Cover with plastic wrap. 30 g 0  . pantoprazole (PROTONIX) 40 MG tablet Take 40 mg by mouth at bedtime.     Marland Kitchen PARoxetine (PAXIL) 40 MG tablet Take 40 mg by mouth at bedtime.     . prochlorperazine (COMPAZINE) 10 MG tablet TAKE 1 TABLET BY MOUTH EVERY 6 HOURS AS NEEDED FOR  NAUSEA OR  VOMITING 120 tablet 1  . sodium chloride (MURO 128) 5 % ophthalmic solution Place 1 drop into both eyes 4 (four) times daily.    Marland Kitchen acetaminophen (TYLENOL) 500 MG tablet Take 500 mg by  mouth every 6 (six) hours as needed for mild pain or moderate pain.     Marland Kitchen atorvastatin (LIPITOR) 10 MG tablet Take 10 mg by mouth at bedtime.     Marland Kitchen buPROPion (WELLBUTRIN XL) 150 MG 24 hr tablet Take 150 mg by mouth at bedtime.     . Calcium-Magnesium-Vitamin D (CALCIUM 1200+D3 PO) Take 1 tablet by mouth daily.     . metFORMIN (GLUCOPHAGE) 850 MG tablet Take 850 mg by mouth 2 (two) times daily with a meal.     . Oxycodone HCl 10 MG TABS Take 1 tablet (10 mg total) by mouth every 4 (four) hours as needed. Ok to take every 4-6 hours as needed. (Patient not taking: Reported on 10/14/2018) 90 tablet 0  . zolpidem (AMBIEN) 10 MG tablet Take 1 tablet (10 mg total) by mouth at bedtime as needed (sleep). (Patient not taking:  Reported on 10/14/2018) 90 tablet 0   No current facility-administered medications for this visit.     OBJECTIVE: Vitals:   10/15/18 1042  BP: 103/72  Pulse: 94  Temp: (!) 95.9 F (35.5 C)     Body mass index is 23.78 kg/m.    ECOG FS:2 - Symptomatic, <50% confined to bed  Physical Exam Constitutional:      Appearance: Normal appearance.     Comments: Sitting in a wheelchair  HENT:     Head: Normocephalic and atraumatic.  Eyes:     Pupils: Pupils are equal, round, and reactive to light.  Neck:     Musculoskeletal: Normal range of motion.  Cardiovascular:     Rate and Rhythm: Normal rate and regular rhythm.     Heart sounds: Normal heart sounds. No murmur.  Pulmonary:     Effort: Pulmonary effort is normal.     Breath sounds: Normal breath sounds. No wheezing.  Abdominal:     General: Bowel sounds are normal. There is no distension.     Palpations: Abdomen is soft.     Tenderness: There is no abdominal tenderness.  Musculoskeletal: Normal range of motion.  Skin:    General: Skin is warm and dry.     Findings: No rash.  Neurological:     Mental Status: She is alert and oriented to person, place, and time.  Psychiatric:        Judgment: Judgment normal.      LAB RESULTS:  Lab Results  Component Value Date   NA 139 10/15/2018   K 4.3 10/15/2018   CL 106 10/15/2018   CO2 25 10/15/2018   GLUCOSE 147 (H) 10/15/2018   BUN 15 10/15/2018   CREATININE 0.69 10/15/2018   CALCIUM 8.7 (L) 10/15/2018   PROT 6.4 (L) 10/15/2018   ALBUMIN 3.4 (L) 10/15/2018   AST 47 (H) 10/15/2018   ALT 20 10/15/2018   ALKPHOS 111 10/15/2018   BILITOT 0.4 10/15/2018   GFRNONAA >60 10/15/2018   GFRAA >60 10/15/2018    Lab Results  Component Value Date   WBC 5.2 10/15/2018   NEUTROABS 3.2 10/15/2018   HGB 10.3 (L) 10/15/2018   HCT 34.5 (L) 10/15/2018   MCV 89.4 10/15/2018   PLT 283 10/15/2018     STUDIES: No results found.  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  PLAN:    1.  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  There is no other obvious sites of disease. MRI of the brain on Jun 03, 2018 did not reveal metastatic disease.  Hospice and end-of-life care have been discussed on multiple occasions, but patient declined and wished to try additional chemotherapy.  She expressed understanding that there is likely no additional options after trying single agent gemcitabine.  Given patient's persistent pancytopenia, she will likely only be able to tolerate treatment every other week.  Proceed with cycle 10 of gemcitabine today.  She is scheduled to return to clinic in 2 weeks for consideration of cycle 11.  She is scheduled for CT abdomen pelvis prior to next treatment. 2.  Anemia: Hemoglobin continues to slowly trend up and is now 10.3. 3.  Peripheral neuropathy: Improved. 4.  Back pain: Does not complain of this today. 5.  Neutropenia: Resolved. 6.  Memory complaints/confusion: Repeat  MRI of the brain on Jun 03, 2018  reviewed independently with no obvious intracranial abnormality.  Patient continues to have progressive and persistent osseous metastatic disease. 7.  Poor appetite: Improved.  Patient is now gaining weight. 8.  Insomnia: Improved.  Continue Ambien as prescribed.    **Initial CMP revealed hypokalemia, hypocalcemia and hypoalbuminemia.  Asked for a repeat lab draw given significant changes from approximately 2 weeks ago.  Repeat lab work more consistent with patient and significantly improved**  Patient expressed understanding and was in agreement with this plan. She also understands that She can call clinic at any time with any questions, concerns, or complaints.   Breast cancer   Staging form: Breast, AJCC 7th Edition     Pathologic stage from 08/11/2014: Stage IIIA (T0, N2a, cM0) - Signed by Lloyd Huger, MD on 08/11/2014   Jacquelin Hawking, NP   10/15/2018 2:57 PM

## 2018-10-28 ENCOUNTER — Other Ambulatory Visit: Payer: Self-pay

## 2018-10-28 ENCOUNTER — Encounter: Payer: Self-pay | Admitting: Oncology

## 2018-10-28 ENCOUNTER — Ambulatory Visit
Admission: RE | Admit: 2018-10-28 | Discharge: 2018-10-28 | Disposition: A | Discharge status: Home / Self Care | Payer: Medicare Other | Source: Ambulatory Visit | Attending: Oncology | Admitting: Oncology

## 2018-10-28 DIAGNOSIS — C787 Secondary malignant neoplasm of liver and intrahepatic bile duct: Secondary | ICD-10-CM | POA: Insufficient documentation

## 2018-10-28 DIAGNOSIS — I2510 Atherosclerotic heart disease of native coronary artery without angina pectoris: Secondary | ICD-10-CM | POA: Insufficient documentation

## 2018-10-28 DIAGNOSIS — I700 Atherosclerosis of aorta: Secondary | ICD-10-CM | POA: Diagnosis not present

## 2018-10-28 DIAGNOSIS — J90 Pleural effusion, not elsewhere classified: Secondary | ICD-10-CM | POA: Diagnosis not present

## 2018-10-28 DIAGNOSIS — C7951 Secondary malignant neoplasm of bone: Secondary | ICD-10-CM | POA: Diagnosis not present

## 2018-10-28 DIAGNOSIS — N200 Calculus of kidney: Secondary | ICD-10-CM | POA: Diagnosis not present

## 2018-10-28 DIAGNOSIS — C50919 Malignant neoplasm of unspecified site of unspecified female breast: Secondary | ICD-10-CM

## 2018-10-28 MED ORDER — IOHEXOL 300 MG/ML  SOLN
85.0000 mL | Freq: Once | INTRAMUSCULAR | Status: AC | PRN
  Administered 2018-10-28: 85 mL via INTRAVENOUS

## 2018-10-28 NOTE — Progress Notes (Signed)
Called patient to prechart for office visit.  Patient denies any SOB, vomiting, diarrhea, constipation, or any new concerns

## 2018-10-28 NOTE — Progress Notes (Signed)
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  INTERVAL HISTORY: Patient returns to clinic today for further evaluation and consideration of cycle 11 of every other week gemcitabine.  She currently feels well.  She has chronic weakness and fatigue, but this continues to improve.  She denies any pain today. Her chronic peripheral neuropathy is unchanged.  She has no other neurologic complaints.  She denies any recent fevers or illnesses.  She denies any chest pain, shortness of breath, cough, or hemoptysis.  She denies any nausea, vomiting, constipation, or diarrhea.  She has no urinary complaints.  Patient offers no further specific complaints today.  REVIEW OF SYSTEMS:   Review of Systems  Constitutional: Positive for malaise/fatigue. Negative for fever and weight loss.  Eyes: Negative.  Negative for blurred vision, double vision and pain.  Respiratory: Negative.  Negative for cough and shortness of breath.   Cardiovascular: Negative.  Negative for chest pain and leg swelling.  Gastrointestinal: Negative.  Negative for abdominal pain.  Genitourinary: Negative.  Negative for dysuria and flank pain.  Musculoskeletal: Negative for back pain and falls.  Skin: Negative.  Negative for rash.  Neurological: Positive for tingling, sensory change and weakness. Negative for focal weakness and headaches.  Endo/Heme/Allergies: Negative.   Psychiatric/Behavioral: Negative.  Negative for memory loss. The patient is not nervous/anxious and does not have insomnia.     As per HPI. Otherwise, a complete review of systems is negative.  PAST MEDICAL HISTORY: Past  Medical History:  Diagnosis Date   Anxiety    Back pain    occasionally   Breast cancer, left ([facility] [place]) 2009   Depression    takes Paxil and Wellbutrin daily   Diabetes ([place])    takes Metformin daily   type 2    Genetic testing 02/03/2017   Multi-Cancer panel (83 genes) @ [facility] - No pathogenic mutations detected   GERD (gastroesophageal reflux disease)    occasional   History of bronchitis 2 yrs ago   History of kidney stones    Hyperlipidemia    takes Atorvastatin daily   Hypertension    no meds   Insomnia    takes Ambien nightly   Insomnia    takes gabapentin nightly   Joint pain    Mood swings    Osteoarthritis of knee    Personal history of chemotherapy 12/05/2016   Mets from Breast Cancer   Personal history of radiation therapy 11/2016   Pneumonia    PONV (postoperative nausea and vomiting)    Seasonal allergies    takes Allegra daily    PAST SURGICAL HISTORY: Past Surgical History:  Procedure Laterality Date   BREAST BIOPSY  2009   cataract surgery Bilateral    COLONOSCOPY     IR RADIOLOGIST EVAL & MGMT  01/29/2018   JOINT REPLACEMENT Left 2014   knee   KNEE ARTHROSCOPY Left    x 5   MASTECTOMY Left    port a cath placed     PORTACATH PLACEMENT N/A 09/17/2015   Procedure: INSERTION PORT-A-CATH;  Surgeon: Jules Husbands, MD;  Location: [facility];  Service: General;  Laterality: N/A;   RADIOLOGY WITH ANESTHESIA N/A 02/20/2018   Procedure:  CT WITH ANESTHESIA MICROWAVE THERMAL ABLATION-LIVER;  Surgeon: Jacqulynn Cadet, MD;  Location: [facility];  Service: Anesthesiology;  Laterality: N/A;   TOOTH EXTRACTION     TOTAL SHOULDER ARTHROPLASTY Right 06/03/2015   Procedure: TOTAL SHOULDER ARTHROPLASTY;  Surgeon: Tania Ade, MD;  Location: [place];  Service: Orthopedics;  Laterality: Right;  Right total shoulder arthroplasty   TOTAL SHOULDER REPLACEMENT Right 06/03/2015   TUBAL LIGATION      FAMILY HISTORY: Father with non-Hodgkin's  lymphoma, 2 paternal aunts with breast cancer.     ADVANCED DIRECTIVES:    HEALTH MAINTENANCE: Social History   Tobacco Use   Smoking status: Never Smoker   Smokeless tobacco: Never Used  Substance Use Topics   Alcohol use: Yes    Alcohol/week: 0.0 standard drinks    Comment: occasionally wine   Drug use: Yes    Types: Marijuana    Comment: cannabis with no extra  low dose edibles  for neuropathy     Colonoscopy:  PAP:  Bone density:  Lipid panel:  Allergies  Allergen Reactions   Ace Inhibitors Cough   Latex Itching   Morphine And Related Itching    Caused her to itch terribly. Would prefer if given to take with a benadryl   Penicillins Rash    Has patient had a PCN reaction causing immediate rash, facial/tongue/throat swelling, SOB or lightheadedness with hypotension: No Has patient had a PCN reaction causing severe rash involving mucus membranes or skin necrosis: No Has patient had a PCN reaction that required hospitalization No Has patient had a PCN reaction occurring within the last 10 years: No If all of the above answers are "NO", then may proceed with Cephalosporin use.    Current Outpatient Medications  Medication Sig Dispense Refill   acetaminophen (TYLENOL) 500 MG tablet Take 500 mg by mouth every 6 (six) hours as needed for mild pain or moderate pain.      atorvastatin (LIPITOR) 10 MG tablet Take 10 mg by mouth at bedtime.      buPROPion (WELLBUTRIN XL) 150 MG 24 hr tablet Take 1 tablet (150 mg total) by mouth at bedtime. 90 tablet 0   Calcium-Magnesium-Vitamin D (CALCIUM 1200+D3 PO) Take 1 tablet by mouth daily.      fexofenadine (ALLEGRA) 180 MG tablet Take 180 mg by mouth at bedtime.      metFORMIN (GLUCOPHAGE) 850 MG tablet Take 850 mg by mouth 2 (two) times daily with a meal.      pantoprazole (PROTONIX) 40 MG tablet Take 40 mg by mouth at bedtime.      PARoxetine (PAXIL) 40 MG tablet Take 40 mg by mouth at bedtime.       prochlorperazine (COMPAZINE) 10 MG tablet TAKE 1 TABLET BY MOUTH EVERY 6 HOURS AS NEEDED FOR  NAUSEA OR  VOMITING 120 tablet 1   sodium chloride (MURO 128) 5 % ophthalmic solution Place 1 drop into both eyes 4 (four) times daily.     lidocaine-prilocaine (EMLA) cream Apply 1 application topically as needed. Apply to port 1-2 hours prior to chemotherapy appointment. Cover with plastic wrap. (Patient not taking: Reported on 10/28/2018) 30 g 0   Oxycodone HCl 10 MG TABS Take 1 tablet (10 mg total) by mouth every 4 (four) hours as needed. Ok to take every 4-6 hours as needed. (Patient not taking: Reported on 10/14/2018) 90 tablet 0   zolpidem (AMBIEN) 10 MG tablet Take 1 tablet (10 mg total) by mouth at bedtime as needed (sleep). (Patient  not taking: Reported on 10/14/2018) 90 tablet 0   No current facility-administered medications for this visit.     OBJECTIVE: Vitals:   10/29/18 1013  BP: 123/77  Pulse: (!) 108  Resp: 18  Temp: (!) 95.4 F (35.2 C)     Body mass index is 23.05 kg/m.    ECOG FS:1 - Symptomatic but completely ambulatory  General: Thin, no acute distress.  Sitting in a wheelchair. Eyes: Pink conjunctiva, anicteric sclera. HEENT: Normocephalic, moist mucous membranes, clear oropharnyx. Lungs: Clear to auscultation bilaterally. Heart: Regular rate and rhythm. No rubs, murmurs, or gallops. Abdomen: Soft, nontender, nondistended. No organomegaly noted, normoactive bowel sounds. Musculoskeletal: No edema, cyanosis, or clubbing. Neuro: Alert, answering all questions appropriately. Cranial nerves grossly intact. Skin: No rashes or petechiae noted. Psych: Normal affect.  LAB RESULTS:  Lab Results  Component Value Date   NA 138 10/29/2018   K 4.4 10/29/2018   CL 106 10/29/2018   CO2 25 10/29/2018   GLUCOSE 176 (H) 10/29/2018   BUN 13 10/29/2018   CREATININE 0.64 10/29/2018   CALCIUM 8.7 (L) 10/29/2018   PROT 6.1 (L) 10/29/2018   ALBUMIN 3.2 (L) 10/29/2018   AST 46  (H) 10/29/2018   ALT 20 10/29/2018   ALKPHOS 107 10/29/2018   BILITOT 0.5 10/29/2018   GFRNONAA >60 10/29/2018   GFRAA >60 10/29/2018    Lab Results  Component Value Date   WBC 5.3 10/29/2018   NEUTROABS 3.3 10/29/2018   HGB 10.5 (L) 10/29/2018   HCT 34.9 (L) 10/29/2018   MCV 91.1 10/29/2018   PLT 259 10/29/2018     STUDIES: Ct Chest W Contrast  Result Date: 10/28/2018 CLINICAL DATA:  Metastatic breast cancer. EXAM: CT CHEST, ABDOMEN, AND PELVIS WITH CONTRAST TECHNIQUE: Multidetector CT imaging of the chest, abdomen and pelvis was performed following the standard protocol during bolus administration of intravenous contrast. CONTRAST:  33m OMNIPAQUE IOHEXOL 300 MG/ML  SOLN COMPARISON:  07/07/2018 FINDINGS: CT CHEST FINDINGS Cardiovascular: The heart size is normal. No substantial pericardial effusion. Coronary artery calcification is evident. Atherosclerotic calcification is noted in the wall of the thoracic aorta. Right Port-A-Cath tip is in the distal SVC near the RA junction. Mediastinum/Nodes: No mediastinal lymphadenopathy. There is no hilar lymphadenopathy. The esophagus has normal imaging features. There is no axillary lymphadenopathy. Lungs/Pleura: Chronic interstitial changes in the left lung are similar as is the volume loss in left hemithorax. Areas of apparent architectural distortion/scarring are similar to prior. Subpleural reticulation anterior left upper lobe is presumably related to radiation therapy. Small right pleural effusion is mildly progressed in the interval. No suspicious pulmonary nodule or mass. Musculoskeletal: Status post left mastectomy with similar appearance of chronic fluid collection in the mastectomy bed. Status post right shoulder replacement. Sclerotic bone metastases again noted, similar to prior 1.7 cm sclerotic lesion in the T11 vertebral body is similar to prior. CT ABDOMEN PELVIS FINDINGS Hepatobiliary: Similar appearance of liver metastases. 1 of the  more dominant lesions in the right liver (image 58/3) measures 2.3 cm today compared to 2.3 cm when remeasured in a similar fashion on the prior study. Gallbladder decompressed. No intrahepatic or extrahepatic biliary dilation. Pancreas: No focal mass lesion. No dilatation of the main duct. No intraparenchymal cyst. No peripancreatic edema. Spleen: Small area of capsular retraction in the inferior spleen is stable. Adrenals/Urinary Tract: No adrenal nodule or mass. 1-2 mm nonobstructing stone noted lower pole right kidney. Central sinus cysts noted left kidney, similar to prior. No evidence for  hydroureter. The urinary bladder appears normal for the degree of distention. Stomach/Bowel: Stomach is unremarkable. No gastric wall thickening. No evidence of outlet obstruction. Duodenum is normally positioned as is the ligament of Treitz. No small bowel wall thickening. No small bowel dilatation. The terminal ileum is normal. The appendix is not visualized, but there is no edema or inflammation in the region of the cecum. No gross colonic mass. No colonic wall thickening. Large stool volume noted in the rectum. Vascular/Lymphatic: There is abdominal aortic atherosclerosis without aneurysm. There is no gastrohepatic or hepatoduodenal ligament lymphadenopathy. No intraperitoneal or retroperitoneal lymphadenopathy. No pelvic sidewall lymphadenopathy. Reproductive: Unremarkable. Other: No intraperitoneal free fluid. Musculoskeletal: Widespread bony metastatic involvement appears similar to prior. 8 mm index lesion in the right sacrum (93/3) is stable. IMPRESSION: 1. Essentially stable exam. Slight increase in the small left pleural effusion with otherwise no new or progressive findings in the chest, abdomen, or pelvis. 2. Stable appearance of diffuse bony metastatic involvement. 3. Similar appearance of the hepatic metastases. 4. Coronary artery and thoracoabdominal aortic atherosclerosis. 5. Nonobstructing right renal stone.  Aortic Atherosclerosis (ICD10-I70.0). Electronically Signed   By: Misty Stanley M.D.   On: 10/28/2018 12:40   Ct Abdomen Pelvis W Contrast  Result Date: 10/28/2018 CLINICAL DATA:  Metastatic breast cancer. EXAM: CT CHEST, ABDOMEN, AND PELVIS WITH CONTRAST TECHNIQUE: Multidetector CT imaging of the chest, abdomen and pelvis was performed following the standard protocol during bolus administration of intravenous contrast. CONTRAST:  16m OMNIPAQUE IOHEXOL 300 MG/ML  SOLN COMPARISON:  07/07/2018 FINDINGS: CT CHEST FINDINGS Cardiovascular: The heart size is normal. No substantial pericardial effusion. Coronary artery calcification is evident. Atherosclerotic calcification is noted in the wall of the thoracic aorta. Right Port-A-Cath tip is in the distal SVC near the RA junction. Mediastinum/Nodes: No mediastinal lymphadenopathy. There is no hilar lymphadenopathy. The esophagus has normal imaging features. There is no axillary lymphadenopathy. Lungs/Pleura: Chronic interstitial changes in the left lung are similar as is the volume loss in left hemithorax. Areas of apparent architectural distortion/scarring are similar to prior. Subpleural reticulation anterior left upper lobe is presumably related to radiation therapy. Small right pleural effusion is mildly progressed in the interval. No suspicious pulmonary nodule or mass. Musculoskeletal: Status post left mastectomy with similar appearance of chronic fluid collection in the mastectomy bed. Status post right shoulder replacement. Sclerotic bone metastases again noted, similar to prior 1.7 cm sclerotic lesion in the T11 vertebral body is similar to prior. CT ABDOMEN PELVIS FINDINGS Hepatobiliary: Similar appearance of liver metastases. 1 of the more dominant lesions in the right liver (image 58/3) measures 2.3 cm today compared to 2.3 cm when remeasured in a similar fashion on the prior study. Gallbladder decompressed. No intrahepatic or extrahepatic biliary  dilation. Pancreas: No focal mass lesion. No dilatation of the main duct. No intraparenchymal cyst. No peripancreatic edema. Spleen: Small area of capsular retraction in the inferior spleen is stable. Adrenals/Urinary Tract: No adrenal nodule or mass. 1-2 mm nonobstructing stone noted lower pole right kidney. Central sinus cysts noted left kidney, similar to prior. No evidence for hydroureter. The urinary bladder appears normal for the degree of distention. Stomach/Bowel: Stomach is unremarkable. No gastric wall thickening. No evidence of outlet obstruction. Duodenum is normally positioned as is the ligament of Treitz. No small bowel wall thickening. No small bowel dilatation. The terminal ileum is normal. The appendix is not visualized, but there is no edema or inflammation in the region of the cecum. No gross colonic mass. No colonic  wall thickening. Large stool volume noted in the rectum. Vascular/Lymphatic: There is abdominal aortic atherosclerosis without aneurysm. There is no gastrohepatic or hepatoduodenal ligament lymphadenopathy. No intraperitoneal or retroperitoneal lymphadenopathy. No pelvic sidewall lymphadenopathy. Reproductive: Unremarkable. Other: No intraperitoneal free fluid. Musculoskeletal: Widespread bony metastatic involvement appears similar to prior. 8 mm index lesion in the right sacrum (93/3) is stable. IMPRESSION: 1. Essentially stable exam. Slight increase in the small left pleural effusion with otherwise no new or progressive findings in the chest, abdomen, or pelvis. 2. Stable appearance of diffuse bony metastatic involvement. 3. Similar appearance of the hepatic metastases. 4. Coronary artery and thoracoabdominal aortic atherosclerosis. 5. Nonobstructing right renal stone. Aortic Atherosclerosis (ICD10-I70.0). Electronically Signed   By: Misty Stanley M.D.   On: 10/28/2018 12:40    ASSESSMENT:  Progressive ER/PR positive, HER-2 negative with metastatic disease in liver and  bone.  PLAN:    1.  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  The majority of her known malignancy is confined to her liver.  There are no other obvious sites of disease. MRI of the brain on Jun 03, 2018 did not reveal metastatic disease.  Although hospice was previously discussed as well as the possibility of gemcitabine being her only therapeutic option, given her continued improving performance status, this may no longer be the case.  Given her history of significant pancytopenia, she is only able to tolerate gemcitabine every other week.  Proceed with cycle 11 of treatment today.  Return to clinic in 2 weeks for further evaluation and consideration of cycle 12.   2.  Anemia: Hemoglobin continues to trend up and is now 10.5. 3.  Peripheral neuropathy: Chronic and unchanged.   4.  Back pain: MRI results from May 04, 2017 did not reveal metastatic disease.  Continue symptomatic treatment.  5.  Left IJ clot: Ultrasound results reviewed independently.  Patient has been instructed to discontinue Eliquis. 6.  Neutropenia: Resolved. 7.  Memory complaints/confusion: Repeat MRI of the brain on Jun 03, 2018 reviewed independently with no obvious intracranial abnormality.  Patient continues to have progressive and persistent osseous metastatic disease. 8.  Elevated liver enzymes: AST is only mildly elevated.. 9.  Apparent aspiration: Resolved. 10.  Poor appetite: Improved.  Patient is now gaining weight. 11.  Insomnia: Improved.  Continue Ambien as prescribed.   12.  Bilateral lung opacities: Resolved.  Patient expressed understanding and was in agreement with this plan. She also understands that She can call clinic at any time with any questions, concerns, or complaints.   Breast cancer   Staging form: Breast, AJCC 7th Edition     Pathologic  stage from 08/11/2014: Stage IIIA (T0, N2a, cM0) - Signed by Lloyd Huger, MD on 08/11/2014   Lloyd Huger, MD   10/30/2018 6:59 AM

## 2018-10-29 ENCOUNTER — Inpatient Hospital Stay: Payer: Medicare Other | Attending: Oncology

## 2018-10-29 ENCOUNTER — Inpatient Hospital Stay (HOSPITAL_BASED_OUTPATIENT_CLINIC_OR_DEPARTMENT_OTHER): Payer: Medicare Other | Admitting: Oncology

## 2018-10-29 ENCOUNTER — Inpatient Hospital Stay: Payer: Medicare Other

## 2018-10-29 ENCOUNTER — Other Ambulatory Visit: Payer: Self-pay

## 2018-10-29 VITALS — BP 123/77 | HR 108 | Temp 95.4°F | Resp 18 | Wt 134.3 lb

## 2018-10-29 DIAGNOSIS — C787 Secondary malignant neoplasm of liver and intrahepatic bile duct: Secondary | ICD-10-CM | POA: Insufficient documentation

## 2018-10-29 DIAGNOSIS — C50919 Malignant neoplasm of unspecified site of unspecified female breast: Secondary | ICD-10-CM

## 2018-10-29 DIAGNOSIS — Z95828 Presence of other vascular implants and grafts: Secondary | ICD-10-CM

## 2018-10-29 DIAGNOSIS — C7951 Secondary malignant neoplasm of bone: Secondary | ICD-10-CM | POA: Insufficient documentation

## 2018-10-29 DIAGNOSIS — Z5111 Encounter for antineoplastic chemotherapy: Secondary | ICD-10-CM | POA: Insufficient documentation

## 2018-10-29 LAB — CBC WITH DIFFERENTIAL/PLATELET
Abs Immature Granulocytes: 0.05 10*3/uL (ref 0.00–0.07)
Basophils Absolute: 0 10*3/uL (ref 0.0–0.1)
Basophils Relative: 1 %
Eosinophils Absolute: 0.1 10*3/uL (ref 0.0–0.5)
Eosinophils Relative: 2 %
HCT: 34.9 % — ABNORMAL LOW (ref 36.0–46.0)
Hemoglobin: 10.5 g/dL — ABNORMAL LOW (ref 12.0–15.0)
Immature Granulocytes: 1 %
Lymphocytes Relative: 16 %
Lymphs Abs: 0.9 10*3/uL (ref 0.7–4.0)
MCH: 27.4 pg (ref 26.0–34.0)
MCHC: 30.1 g/dL (ref 30.0–36.0)
MCV: 91.1 fL (ref 80.0–100.0)
Monocytes Absolute: 0.9 10*3/uL (ref 0.1–1.0)
Monocytes Relative: 17 %
Neutro Abs: 3.3 10*3/uL (ref 1.7–7.7)
Neutrophils Relative %: 63 %
Platelets: 259 10*3/uL (ref 150–400)
RBC: 3.83 MIL/uL — ABNORMAL LOW (ref 3.87–5.11)
RDW: 22.5 % — ABNORMAL HIGH (ref 11.5–15.5)
WBC: 5.3 10*3/uL (ref 4.0–10.5)
nRBC: 0 % (ref 0.0–0.2)

## 2018-10-29 LAB — COMPREHENSIVE METABOLIC PANEL
ALT: 20 U/L (ref 0–44)
AST: 46 U/L — ABNORMAL HIGH (ref 15–41)
Albumin: 3.2 g/dL — ABNORMAL LOW (ref 3.5–5.0)
Alkaline Phosphatase: 107 U/L (ref 38–126)
Anion gap: 7 (ref 5–15)
BUN: 13 mg/dL (ref 8–23)
CO2: 25 mmol/L (ref 22–32)
Calcium: 8.7 mg/dL — ABNORMAL LOW (ref 8.9–10.3)
Chloride: 106 mmol/L (ref 98–111)
Creatinine, Ser: 0.64 mg/dL (ref 0.44–1.00)
GFR calc Af Amer: >60 mL/min (ref >60)
GFR calc non Af Amer: >60 mL/min (ref >60)
Glucose, Bld: 176 mg/dL — ABNORMAL HIGH (ref 70–99)
Potassium: 4.4 mmol/L (ref 3.5–5.1)
Sodium: 138 mmol/L (ref 135–145)
Total Bilirubin: 0.5 mg/dL (ref 0.3–1.2)
Total Protein: 6.1 g/dL — ABNORMAL LOW (ref 6.5–8.1)

## 2018-10-29 MED ORDER — SODIUM CHLORIDE 0.9 % IV SOLN
1400.0000 mg | Freq: Once | INTRAVENOUS | Status: AC
  Administered 2018-10-29: 12:00:00 1400 mg via INTRAVENOUS
  Filled 2018-10-29: qty 26.3

## 2018-10-29 MED ORDER — SODIUM CHLORIDE 0.9% FLUSH
10.0000 mL | Freq: Once | INTRAVENOUS | Status: AC
  Administered 2018-10-29: 10 mL via INTRAVENOUS
  Filled 2018-10-29: qty 10

## 2018-10-29 MED ORDER — PROCHLORPERAZINE MALEATE 10 MG PO TABS
10.0000 mg | ORAL_TABLET | Freq: Once | ORAL | Status: AC
  Administered 2018-10-29: 10 mg via ORAL
  Filled 2018-10-29: qty 1

## 2018-10-29 MED ORDER — HEPARIN SOD (PORK) LOCK FLUSH 100 UNIT/ML IV SOLN
500.0000 [IU] | Freq: Once | INTRAVENOUS | Status: AC | PRN
  Administered 2018-10-29: 500 [IU]
  Filled 2018-10-29: qty 5

## 2018-10-29 MED ORDER — SODIUM CHLORIDE 0.9 % IV SOLN
Freq: Once | INTRAVENOUS | Status: AC
  Administered 2018-10-29: 11:00:00 via INTRAVENOUS
  Filled 2018-10-29: qty 250

## 2018-10-29 MED ORDER — BUPROPION HCL ER (XL) 150 MG PO TB24: 150.0000 mg | ORAL_TABLET | Freq: Every day | ORAL | 0 refills | Status: DC

## 2018-10-29 NOTE — Progress Notes (Signed)
Per Almyra Free CMA per Dr. Grayland Ormond okay to proceed with treatment with HR of 108.

## 2018-11-04 ENCOUNTER — Other Ambulatory Visit: Payer: Self-pay

## 2018-11-04 ENCOUNTER — Encounter: Payer: Self-pay | Admitting: Nurse Practitioner

## 2018-11-04 ENCOUNTER — Other Ambulatory Visit: Payer: Medicare Other | Admitting: Nurse Practitioner

## 2018-11-04 DIAGNOSIS — Z515 Encounter for palliative care: Secondary | ICD-10-CM

## 2018-11-04 NOTE — Progress Notes (Signed)
[facility] Palliative Care Consult Note Telephone: [phone]  Fax: [phone]  PATIENT NAME: Amanda [name] DOB: 02/22/1950 MRN: [id number]  PRIMARY CARE PROVIDER:   Sofie Hartigan, MD  REFERRING PROVIDER:  Sofie Hartigan, MD [place],  [place] [address]  RESPONSIBLE PARTY:   Mr. Kateland Mowery [id number] or [id number]  Due to the COVID-19 crisis, this visit was done via telemedicine from my office and it was initiated and consent by this patient and or family.  RECOMMENDATIONS and PLAN: 1.ACP: DNR; continue chemotherapy, transfusions, IVF; treat what is treatable  2.Anorexia continues to improving from 2 cans ensure to 4 a day with requests by Ms. Kniskern. Continue to encourage supplements and comfort feedings, improving  3.Generalized weaknesssecondary to disease progression; chemotherapy;improving with PT/OT,. Encourage energy conservation and rest times.  4.Palliative care encounter Palliative medicine team will continue to support patient, patient's family, and medical team. Visit consisted of counseling and education dealing with the complex and emotionally intense issues of symptom management and palliative care in the setting of serious and potentially life-threatening illness  I spent 35 minutes providing this consultation,  from 1:00pm to 1:35pm. More than 50% of the time in this consultation was spent coordinating communication.   HISTORY OF PRESENT ILLNESS:  Amanda [name] is a 68 y.o. year old female with multiple medical problems including I called Mr and Mrs Stigen for scheduled telemedicine, telephonic follow up palliative care visit. Review purpose of palliative care visit and Mr. Vien in agreement. We talked about Ms Gilliand is feeling. Mr. Perilli endorses that she is doing well. Mr Wilmington very pleased with the progress that's been made. She is able to stand now. She does have intermittent pain and nausea  the medications prescribed have been effective. She is eating more and has gained a few pounds. Ms. Hartford had a recent visit with Dr. [facility] with positive results from last CT scan. No change and she does appear to be tolerating chemotherapy. Mr. Stickel endorses that they are just taking one day at a time. Mr. Kreeger endorses bad on the good day she is asking for a pepperoni [employer] in on the more difficult day she is asking for a toaster strudel. I can tell by her food request how the day is going to be. She does continue to have chronic diarrhea incontinence for which to discuss with Dr Grayland O[facility]. We talked about quality of life. We talked about Ms. Huy continues with chemotherapy, treat side effects with currently prescribed antimedic and pain management as needed. We talked about how Mr Dautel is doing and he shares that he is doing fine, is skeptical at times with the incontinent diarrhea but he does the best he can. We talked about role of palliative care and plan of care. Discuss that will follow up in 1 month if needed or sooner should she declined. Mr. Bumphus in agreement. Appointment time schedule. Therapeutic listening and emotional support provided. Contact information provided. Questions thanks her satisfaction.  Palliative Care was asked to help to continue address goals of care.   CODE STATUS: DNR  PPS: 40% HOSPICE ELIGIBILITY/DIAGNOSIS: TBD  PAST MEDICAL HISTORY:  Past Medical History:  Diagnosis Date  . Anxiety   . Back pain    occasionally  . Breast cancer, left ([place]) 2009  . Depression    takes Paxil and Wellbutrin daily  . Diabetes ([place])    takes Metformin daily   type  2   . Genetic testing 02/03/2017   Multi-Cancer panel (83 genes) @ [facility] - No pathogenic mutations detected  . GERD (gastroesophageal reflux disease)    occasional  . History of bronchitis 2 yrs ago  . History of kidney stones   . Hyperlipidemia    takes Atorvastatin daily  .  Hypertension    no meds  . Insomnia    takes Ambien nightly  . Insomnia    takes gabapentin nightly  . Joint pain   . Mood swings   . Osteoarthritis of knee   . Personal history of chemotherapy 12/05/2016   Mets from Breast Cancer  . Personal history of radiation therapy 11/2016  . Pneumonia   . PONV (postoperative nausea and vomiting)   . Seasonal allergies    takes Allegra daily    SOCIAL HX:  Social History   Tobacco Use  . Smoking status: Never Smoker  . Smokeless tobacco: Never Used  Substance Use Topics  . Alcohol use: Yes    Alcohol/week: 0.0 standard drinks    Comment: occasionally wine    ALLERGIES:  Allergies  Allergen Reactions  . Ace Inhibitors Cough  . Latex Itching  . Morphine And Related Itching    Caused her to itch terribly. Would prefer if given to take with a benadryl  . Penicillins Rash    Has patient had a PCN reaction causing immediate rash, facial/tongue/throat swelling, SOB or lightheadedness with hypotension: No Has patient had a PCN reaction causing severe rash involving mucus membranes or skin necrosis: No Has patient had a PCN reaction that required hospitalization No Has patient had a PCN reaction occurring within the last 10 years: No If all of the above answers are "NO", then may proceed with Cephalosporin use.     PERTINENT MEDICATIONS:  Outpatient Encounter Medications as of 11/04/2018  Medication Sig  . acetaminophen (TYLENOL) 500 MG tablet Take 500 mg by mouth every 6 (six) hours as needed for mild pain or moderate pain.   Marland Kitchen atorvastatin (LIPITOR) 10 MG tablet Take 10 mg by mouth at bedtime.   Marland Kitchen buPROPion (WELLBUTRIN XL) 150 MG 24 hr tablet Take 1 tablet (150 mg total) by mouth at bedtime.  . Calcium-Magnesium-Vitamin D (CALCIUM 1200+D3 PO) Take 1 tablet by mouth daily.   . fexofenadine (ALLEGRA) 180 MG tablet Take 180 mg by mouth at bedtime.   . lidocaine-prilocaine (EMLA) cream Apply 1 application topically as needed. Apply to  port 1-2 hours prior to chemotherapy appointment. Cover with plastic wrap. (Patient not taking: Reported on 10/28/2018)  . metFORMIN (GLUCOPHAGE) 850 MG tablet Take 850 mg by mouth 2 (two) times daily with a meal.   . Oxycodone HCl 10 MG TABS Take 1 tablet (10 mg total) by mouth every 4 (four) hours as needed. Ok to take every 4-6 hours as needed. (Patient not taking: Reported on 10/14/2018)  . pantoprazole (PROTONIX) 40 MG tablet Take 40 mg by mouth at bedtime.   Marland Kitchen PARoxetine (PAXIL) 40 MG tablet Take 40 mg by mouth at bedtime.   . prochlorperazine (COMPAZINE) 10 MG tablet TAKE 1 TABLET BY MOUTH EVERY 6 HOURS AS NEEDED FOR  NAUSEA OR  VOMITING  . sodium chloride (MURO 128) 5 % ophthalmic solution Place 1 drop into both eyes 4 (four) times daily.  Marland Kitchen zolpidem (AMBIEN) 10 MG tablet Take 1 tablet (10 mg total) by mouth at bedtime as needed (sleep). (Patient not taking: Reported on 10/14/2018)   No facility-administered encounter medications  on file as of 11/04/2018.     PHYSICAL EXAM:   Deferred  Rhyan Wolters Z Shaena Parkerson, NP

## 2018-11-10 NOTE — Progress Notes (Signed)
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  INTERVAL HISTORY: Patient returns to clinic today for further evaluation and consideration of cycle 12 of gemcitabine.  She continues to feel well.  She has chronic weakness and fatigue, but this continues to improve.  She denies any pain.  She has chronic peripheral neuropathy, but no other neurologic complaints. She denies any recent fevers or illnesses.  She denies any chest pain, shortness of breath, cough, or hemoptysis.  She denies any nausea, vomiting, constipation, or diarrhea.  She has no urinary complaints.  Patient offers no further specific complaints today.  REVIEW OF SYSTEMS:   Review of Systems  Constitutional: Positive for malaise/fatigue. Negative for fever and weight loss.  Eyes: Negative.  Negative for blurred vision, double vision and pain.  Respiratory: Negative.  Negative for cough and shortness of breath.   Cardiovascular: Negative.  Negative for chest pain and leg swelling.  Gastrointestinal: Negative.  Negative for abdominal pain.  Genitourinary: Negative.  Negative for dysuria and flank pain.  Musculoskeletal: Negative for back pain and falls.  Skin: Negative.  Negative for rash.  Neurological: Positive for tingling, sensory change and weakness. Negative for focal weakness and headaches.  Endo/Heme/Allergies: Negative.   Psychiatric/Behavioral: Negative.  Negative for memory loss. The patient is not nervous/anxious and does not have insomnia.     As per HPI. Otherwise, a complete review of systems is negative.  PAST MEDICAL HISTORY: Past Medical History:  Diagnosis Date    Anxiety    Back pain    occasionally   Breast cancer, left ([place]) 2009   Depression    takes Paxil and Wellbutrin daily   Diabetes ([place])    takes Metformin daily   type 2    Genetic testing 02/03/2017   Multi-Cancer panel (83 genes) @ [facility] - No pathogenic mutations detected   GERD (gastroesophageal reflux disease)    occasional   History of bronchitis 2 yrs ago   History of kidney stones    Hyperlipidemia    takes Atorvastatin daily   Hypertension    no meds   Insomnia    takes Ambien nightly   Insomnia    takes gabapentin nightly   Joint pain    Mood swings    Osteoarthritis of knee    Personal history of chemotherapy 12/05/2016   Mets from Breast Cancer   Personal history of radiation therapy 11/2016   Pneumonia    PONV (postoperative nausea and vomiting)    Seasonal allergies    takes Allegra daily    PAST SURGICAL HISTORY: Past Surgical History:  Procedure Laterality Date   BREAST BIOPSY  2009   cataract surgery Bilateral    COLONOSCOPY     IR RADIOLOGIST EVAL & MGMT  01/29/2018   JOINT REPLACEMENT Left 2014   knee   KNEE ARTHROSCOPY Left    x 5   MASTECTOMY Left    port a cath placed     PORTACATH PLACEMENT N/A 09/17/2015   Procedure: INSERTION PORT-A-CATH;  Surgeon: Jules Husbands, MD;  Location: [facility];  Service: General;  Laterality: N/A;   RADIOLOGY WITH ANESTHESIA N/A 02/20/2018   Procedure: CT WITH ANESTHESIA MICROWAVE THERMAL ABLATION-LIVER;  Surgeon: Jacqulynn Cadet, MD;  Location: [facility];  Service: Anesthesiology;  Laterality: N/A;   TOOTH EXTRACTION     TOTAL SHOULDER ARTHROPLASTY Right 06/03/2015   Procedure: TOTAL SHOULDER ARTHROPLASTY;  Surgeon: Tania Ade, MD;  Location: [facility];  Service: Orthopedics;  Laterality: Right;  Right total shoulder arthroplasty   TOTAL SHOULDER REPLACEMENT Right 06/03/2015   TUBAL LIGATION      FAMILY HISTORY: Father with non-Hodgkin's lymphoma, 2 paternal aunts with breast  cancer.     ADVANCED DIRECTIVES:    HEALTH MAINTENANCE: Social History   Tobacco Use   Smoking status: Never Smoker   Smokeless tobacco: Never Used  Substance Use Topics   Alcohol use: Yes    Alcohol/week: 0.0 standard drinks    Comment: occasionally wine   Drug use: Yes    Types: Marijuana    Comment: cannabis with no extra  low dose edibles  for neuropathy     Colonoscopy:  PAP:  Bone density:  Lipid panel:  Allergies  Allergen Reactions   Ace Inhibitors Cough   Latex Itching   Morphine And Related Itching    Caused her to itch terribly. Would prefer if given to take with a benadryl   Penicillins Rash    Has patient had a PCN reaction causing immediate rash, facial/tongue/throat swelling, SOB or lightheadedness with hypotension: No Has patient had a PCN reaction causing severe rash involving mucus membranes or skin necrosis: No Has patient had a PCN reaction that required hospitalization No Has patient had a PCN reaction occurring within the last 10 years: No If all of the above answers are "NO", then may proceed with Cephalosporin use.    Current Outpatient Medications  Medication Sig Dispense Refill   acetaminophen (TYLENOL) 500 MG tablet Take 500 mg by mouth every 6 (six) hours as needed for mild pain or moderate pain.      atorvastatin (LIPITOR) 10 MG tablet Take 10 mg by mouth at bedtime.      buPROPion (WELLBUTRIN XL) 150 MG 24 hr tablet Take 1 tablet (150 mg total) by mouth at bedtime. 90 tablet 0   Calcium-Magnesium-Vitamin D (CALCIUM 1200+D3 PO) Take 1 tablet by mouth daily.      fexofenadine (ALLEGRA) 180 MG tablet Take 180 mg by mouth at bedtime.      lidocaine-prilocaine (EMLA) cream Apply 1 application topically as needed. Apply to port 1-2 hours prior to chemotherapy appointment. Cover with plastic wrap. (Patient not taking: Reported on 10/28/2018) 30 g 0   metFORMIN (GLUCOPHAGE) 850 MG tablet Take 850 mg by mouth 2 (two) times daily with  a meal.      Oxycodone HCl 10 MG TABS Take 1 tablet (10 mg total) by mouth every 4 (four) hours as needed. Ok to take every 4-6 hours as needed. (Patient not taking: Reported on 10/14/2018) 90 tablet 0   pantoprazole (PROTONIX) 40 MG tablet Take 40 mg by mouth at bedtime.      PARoxetine (PAXIL) 40 MG tablet Take 40 mg by mouth at bedtime.      prochlorperazine (COMPAZINE) 10 MG tablet TAKE 1 TABLET BY MOUTH EVERY 6 HOURS AS NEEDED FOR  NAUSEA OR  VOMITING 120 tablet 1   sodium chloride (MURO 128) 5 % ophthalmic solution Place 1 drop into both eyes 4 (four) times daily.     zolpidem (AMBIEN) 10 MG tablet Take 1 tablet (10 mg total) by mouth at bedtime as needed (sleep). (Patient not taking: Reported on 10/14/2018) 90 tablet  0   No current facility-administered medications for this visit.     OBJECTIVE: Vitals:   11/12/18 1027  BP: 115/72  Pulse: 99  Temp: (!) 97.3 F (36.3 C)     Body mass index is 24.89 kg/m.    ECOG FS:1 - Symptomatic but completely ambulatory  General: Thin, no acute distress. Eyes: Pink conjunctiva, anicteric sclera. HEENT: Normocephalic, moist mucous membranes. Lungs: Clear to auscultation bilaterally. Heart: Regular rate and rhythm. No rubs, murmurs, or gallops. Abdomen: Soft, nontender, nondistended. No organomegaly noted, normoactive bowel sounds. Musculoskeletal: No edema, cyanosis, or clubbing. Neuro: Alert, answering all questions appropriately. Cranial nerves grossly intact. Skin: No rashes or petechiae noted. Psych: Normal affect.  LAB RESULTS:  Lab Results  Component Value Date   NA 140 11/12/2018   K 4.1 11/12/2018   CL 107 11/12/2018   CO2 24 11/12/2018   GLUCOSE 172 (H) 11/12/2018   BUN 13 11/12/2018   CREATININE 0.71 11/12/2018   CALCIUM 8.8 (L) 11/12/2018   PROT 6.0 (L) 11/12/2018   ALBUMIN 3.4 (L) 11/12/2018   AST 56 (H) 11/12/2018   ALT 21 11/12/2018   ALKPHOS 122 11/12/2018   BILITOT 0.6 11/12/2018   GFRNONAA >60 11/12/2018     GFRAA >60 11/12/2018    Lab Results  Component Value Date   WBC 6.0 11/12/2018   NEUTROABS 3.7 11/12/2018   HGB 10.8 (L) 11/12/2018   HCT 35.4 (L) 11/12/2018   MCV 92.7 11/12/2018   PLT 246 11/12/2018     STUDIES: Ct Chest W Contrast  Result Date: 10/28/2018 CLINICAL DATA:  Metastatic breast cancer. EXAM: CT CHEST, ABDOMEN, AND PELVIS WITH CONTRAST TECHNIQUE: Multidetector CT imaging of the chest, abdomen and pelvis was performed following the standard protocol during bolus administration of intravenous contrast. CONTRAST:  69m OMNIPAQUE IOHEXOL 300 MG/ML  SOLN COMPARISON:  07/07/2018 FINDINGS: CT CHEST FINDINGS Cardiovascular: The heart size is normal. No substantial pericardial effusion. Coronary artery calcification is evident. Atherosclerotic calcification is noted in the wall of the thoracic aorta. Right Port-A-Cath tip is in the distal SVC near the RA junction. Mediastinum/Nodes: No mediastinal lymphadenopathy. There is no hilar lymphadenopathy. The esophagus has normal imaging features. There is no axillary lymphadenopathy. Lungs/Pleura: Chronic interstitial changes in the left lung are similar as is the volume loss in left hemithorax. Areas of apparent architectural distortion/scarring are similar to prior. Subpleural reticulation anterior left upper lobe is presumably related to radiation therapy. Small right pleural effusion is mildly progressed in the interval. No suspicious pulmonary nodule or mass. Musculoskeletal: Status post left mastectomy with similar appearance of chronic fluid collection in the mastectomy bed. Status post right shoulder replacement. Sclerotic bone metastases again noted, similar to prior 1.7 cm sclerotic lesion in the T11 vertebral body is similar to prior. CT ABDOMEN PELVIS FINDINGS Hepatobiliary: Similar appearance of liver metastases. 1 of the more dominant lesions in the right liver (image 58/3) measures 2.3 cm today compared to 2.3 cm when remeasured in  a similar fashion on the prior study. Gallbladder decompressed. No intrahepatic or extrahepatic biliary dilation. Pancreas: No focal mass lesion. No dilatation of the main duct. No intraparenchymal cyst. No peripancreatic edema. Spleen: Small area of capsular retraction in the inferior spleen is stable. Adrenals/Urinary Tract: No adrenal nodule or mass. 1-2 mm nonobstructing stone noted lower pole right kidney. Central sinus cysts noted left kidney, similar to prior. No evidence for hydroureter. The urinary bladder appears normal for the degree of distention. Stomach/Bowel: Stomach is unremarkable. No gastric  wall thickening. No evidence of outlet obstruction. Duodenum is normally positioned as is the ligament of Treitz. No small bowel wall thickening. No small bowel dilatation. The terminal ileum is normal. The appendix is not visualized, but there is no edema or inflammation in the region of the cecum. No gross colonic mass. No colonic wall thickening. Large stool volume noted in the rectum. Vascular/Lymphatic: There is abdominal aortic atherosclerosis without aneurysm. There is no gastrohepatic or hepatoduodenal ligament lymphadenopathy. No intraperitoneal or retroperitoneal lymphadenopathy. No pelvic sidewall lymphadenopathy. Reproductive: Unremarkable. Other: No intraperitoneal free fluid. Musculoskeletal: Widespread bony metastatic involvement appears similar to prior. 8 mm index lesion in the right sacrum (93/3) is stable. IMPRESSION: 1. Essentially stable exam. Slight increase in the small left pleural effusion with otherwise no new or progressive findings in the chest, abdomen, or pelvis. 2. Stable appearance of diffuse bony metastatic involvement. 3. Similar appearance of the hepatic metastases. 4. Coronary artery and thoracoabdominal aortic atherosclerosis. 5. Nonobstructing right renal stone. Aortic Atherosclerosis (ICD10-I70.0). Electronically Signed   By: Misty Stanley M.D.   On: 10/28/2018 12:40    Ct Abdomen Pelvis W Contrast  Result Date: 10/28/2018 CLINICAL DATA:  Metastatic breast cancer. EXAM: CT CHEST, ABDOMEN, AND PELVIS WITH CONTRAST TECHNIQUE: Multidetector CT imaging of the chest, abdomen and pelvis was performed following the standard protocol during bolus administration of intravenous contrast. CONTRAST:  93m OMNIPAQUE IOHEXOL 300 MG/ML  SOLN COMPARISON:  07/07/2018 FINDINGS: CT CHEST FINDINGS Cardiovascular: The heart size is normal. No substantial pericardial effusion. Coronary artery calcification is evident. Atherosclerotic calcification is noted in the wall of the thoracic aorta. Right Port-A-Cath tip is in the distal SVC near the RA junction. Mediastinum/Nodes: No mediastinal lymphadenopathy. There is no hilar lymphadenopathy. The esophagus has normal imaging features. There is no axillary lymphadenopathy. Lungs/Pleura: Chronic interstitial changes in the left lung are similar as is the volume loss in left hemithorax. Areas of apparent architectural distortion/scarring are similar to prior. Subpleural reticulation anterior left upper lobe is presumably related to radiation therapy. Small right pleural effusion is mildly progressed in the interval. No suspicious pulmonary nodule or mass. Musculoskeletal: Status post left mastectomy with similar appearance of chronic fluid collection in the mastectomy bed. Status post right shoulder replacement. Sclerotic bone metastases again noted, similar to prior 1.7 cm sclerotic lesion in the T11 vertebral body is similar to prior. CT ABDOMEN PELVIS FINDINGS Hepatobiliary: Similar appearance of liver metastases. 1 of the more dominant lesions in the right liver (image 58/3) measures 2.3 cm today compared to 2.3 cm when remeasured in a similar fashion on the prior study. Gallbladder decompressed. No intrahepatic or extrahepatic biliary dilation. Pancreas: No focal mass lesion. No dilatation of the main duct. No intraparenchymal cyst. No  peripancreatic edema. Spleen: Small area of capsular retraction in the inferior spleen is stable. Adrenals/Urinary Tract: No adrenal nodule or mass. 1-2 mm nonobstructing stone noted lower pole right kidney. Central sinus cysts noted left kidney, similar to prior. No evidence for hydroureter. The urinary bladder appears normal for the degree of distention. Stomach/Bowel: Stomach is unremarkable. No gastric wall thickening. No evidence of outlet obstruction. Duodenum is normally positioned as is the ligament of Treitz. No small bowel wall thickening. No small bowel dilatation. The terminal ileum is normal. The appendix is not visualized, but there is no edema or inflammation in the region of the cecum. No gross colonic mass. No colonic wall thickening. Large stool volume noted in the rectum. Vascular/Lymphatic: There is abdominal aortic atherosclerosis without aneurysm.  There is no gastrohepatic or hepatoduodenal ligament lymphadenopathy. No intraperitoneal or retroperitoneal lymphadenopathy. No pelvic sidewall lymphadenopathy. Reproductive: Unremarkable. Other: No intraperitoneal free fluid. Musculoskeletal: Widespread bony metastatic involvement appears similar to prior. 8 mm index lesion in the right sacrum (93/3) is stable. IMPRESSION: 1. Essentially stable exam. Slight increase in the small left pleural effusion with otherwise no new or progressive findings in the chest, abdomen, or pelvis. 2. Stable appearance of diffuse bony metastatic involvement. 3. Similar appearance of the hepatic metastases. 4. Coronary artery and thoracoabdominal aortic atherosclerosis. 5. Nonobstructing right renal stone. Aortic Atherosclerosis (ICD10-I70.0). Electronically Signed   By: Misty Stanley M.D.   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  PLAN:    1.  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  The majority of her known malignancy is confined to her liver.  There are no other obvious sites of disease. MRI of the brain on Jun 03, 2018 did not reveal metastatic disease.  Although hospice was previously discussed as well as the possibility of gemcitabine being her only therapeutic option, given her continued improving performance status, this may no longer be the case.  Given her history of significant pancytopenia, she is only able to tolerate gemcitabine every other week.  Proceed with cycle 12 of treatment today.  Return to clinic in 2 weeks for further evaluation and consideration of cycle 13.  Plan to reimage in January 2021.   2.  Anemia: Hemoglobin continues to improve and is now 10.8. 3.  Peripheral neuropathy: Chronic and unchanged.   4.  Back pain: MRI results from May 04, 2017 did not reveal metastatic disease.  Continue symptomatic treatment.  5.  Left IJ clot: Ultrasound results reviewed independently.  Patient has been instructed to discontinue Eliquis. 6.  Neutropenia: Resolved. 7.  Memory complaints/confusion: Repeat MRI of the brain on Jun 03, 2018 reviewed independently with no obvious intracranial abnormality.  Patient continues to have progressive and persistent osseous metastatic disease. 8.  Elevated liver enzymes: Chronic and unchanged.  AST is only mildly elevated. 9.  Apparent aspiration: Resolved. 10.  Poor appetite: Improved.  Patient is now gaining weight. 11.  Insomnia: Improved.  Continue Ambien as prescribed.   12.  Bilateral lung opacities: Resolved. 13.  Weakness and fatigue: Patient was given a referral to the [facility] program.  Patient expressed understanding and was in agreement with this plan. She also understands that She can call clinic at any time with any questions, concerns, or complaints.   Breast cancer   Staging form: Breast, AJCC 7th  Edition     Pathologic stage from 08/11/2014: Stage IIIA (T0, N2a, cM0) - Signed by Lloyd Huger, MD on 08/11/2014   Lloyd Huger, MD   11/12/2018 3:42 PM

## 2018-11-12 ENCOUNTER — Inpatient Hospital Stay: Payer: Medicare Other

## 2018-11-12 ENCOUNTER — Other Ambulatory Visit: Payer: Self-pay

## 2018-11-12 ENCOUNTER — Inpatient Hospital Stay (HOSPITAL_BASED_OUTPATIENT_CLINIC_OR_DEPARTMENT_OTHER): Payer: Medicare Other | Admitting: Oncology

## 2018-11-12 VITALS — BP 115/72 | HR 99 | Temp 97.3°F | Wt 145.0 lb

## 2018-11-12 DIAGNOSIS — C50919 Malignant neoplasm of unspecified site of unspecified female breast: Secondary | ICD-10-CM | POA: Diagnosis not present

## 2018-11-12 DIAGNOSIS — Z5111 Encounter for antineoplastic chemotherapy: Secondary | ICD-10-CM | POA: Diagnosis not present

## 2018-11-12 DIAGNOSIS — C50912 Malignant neoplasm of unspecified site of left female breast: Secondary | ICD-10-CM

## 2018-11-12 DIAGNOSIS — C787 Secondary malignant neoplasm of liver and intrahepatic bile duct: Secondary | ICD-10-CM | POA: Diagnosis not present

## 2018-11-12 DIAGNOSIS — C7951 Secondary malignant neoplasm of bone: Secondary | ICD-10-CM | POA: Diagnosis not present

## 2018-11-12 DIAGNOSIS — Z170 Estrogen receptor positive status [ER+]: Secondary | ICD-10-CM

## 2018-11-12 LAB — COMPREHENSIVE METABOLIC PANEL
ALT: 21 U/L (ref 0–44)
AST: 56 U/L — ABNORMAL HIGH (ref 15–41)
Albumin: 3.4 g/dL — ABNORMAL LOW (ref 3.5–5.0)
Alkaline Phosphatase: 122 U/L (ref 38–126)
Anion gap: 9 (ref 5–15)
BUN: 13 mg/dL (ref 8–23)
CO2: 24 mmol/L (ref 22–32)
Calcium: 8.8 mg/dL — ABNORMAL LOW (ref 8.9–10.3)
Chloride: 107 mmol/L (ref 98–111)
Creatinine, Ser: 0.71 mg/dL (ref 0.44–1.00)
GFR calc Af Amer: >60 mL/min (ref >60)
GFR calc non Af Amer: >60 mL/min (ref >60)
Glucose, Bld: 172 mg/dL — ABNORMAL HIGH (ref 70–99)
Potassium: 4.1 mmol/L (ref 3.5–5.1)
Sodium: 140 mmol/L (ref 135–145)
Total Bilirubin: 0.6 mg/dL (ref 0.3–1.2)
Total Protein: 6 g/dL — ABNORMAL LOW (ref 6.5–8.1)

## 2018-11-12 LAB — CBC WITH DIFFERENTIAL/PLATELET
Abs Immature Granulocytes: 0.06 10*3/uL (ref 0.00–0.07)
Basophils Absolute: 0.1 10*3/uL (ref 0.0–0.1)
Basophils Relative: 1 %
Eosinophils Absolute: 0.2 10*3/uL (ref 0.0–0.5)
Eosinophils Relative: 4 %
HCT: 35.4 % — ABNORMAL LOW (ref 36.0–46.0)
Hemoglobin: 10.8 g/dL — ABNORMAL LOW (ref 12.0–15.0)
Immature Granulocytes: 1 %
Lymphocytes Relative: 16 %
Lymphs Abs: 1 10*3/uL (ref 0.7–4.0)
MCH: 28.3 pg (ref 26.0–34.0)
MCHC: 30.5 g/dL (ref 30.0–36.0)
MCV: 92.7 fL (ref 80.0–100.0)
Monocytes Absolute: 1 10*3/uL (ref 0.1–1.0)
Monocytes Relative: 17 %
Neutro Abs: 3.7 10*3/uL (ref 1.7–7.7)
Neutrophils Relative %: 61 %
Platelets: 246 10*3/uL (ref 150–400)
RBC: 3.82 MIL/uL — ABNORMAL LOW (ref 3.87–5.11)
RDW: 22.3 % — ABNORMAL HIGH (ref 11.5–15.5)
WBC: 6 10*3/uL (ref 4.0–10.5)
nRBC: 0.3 % — ABNORMAL HIGH (ref 0.0–0.2)

## 2018-11-12 MED ORDER — HEPARIN SOD (PORK) LOCK FLUSH 100 UNIT/ML IV SOLN
500.0000 [IU] | Freq: Once | INTRAVENOUS | Status: AC | PRN
  Administered 2018-11-12: 500 [IU]
  Filled 2018-11-12: qty 5

## 2018-11-12 MED ORDER — PROCHLORPERAZINE MALEATE 10 MG PO TABS
10.0000 mg | ORAL_TABLET | Freq: Once | ORAL | Status: AC
  Administered 2018-11-12: 10 mg via ORAL
  Filled 2018-11-12: qty 1

## 2018-11-12 MED ORDER — SODIUM CHLORIDE 0.9 % IV SOLN
1400.0000 mg | Freq: Once | INTRAVENOUS | Status: AC
  Administered 2018-11-12: 12:00:00 1400 mg via INTRAVENOUS
  Filled 2018-11-12: qty 26.3

## 2018-11-12 MED ORDER — SODIUM CHLORIDE 0.9 % IV SOLN
Freq: Once | INTRAVENOUS | Status: AC
  Administered 2018-11-12: 11:00:00 via INTRAVENOUS
  Filled 2018-11-12: qty 250

## 2018-11-22 NOTE — Progress Notes (Signed)
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  INTERVAL HISTORY: Patient returns to clinic today for further evaluation and consideration of cycle 13 of single agent gemcitabine.  She is tolerating her treatments well without significant side effects.  She continues to have chronic weakness and fatigue, but states this is mildly improved. She denies any pain.  She has chronic peripheral neuropathy, but no other neurologic complaints. She denies any recent fevers or illnesses.  She denies any chest pain, shortness of breath, cough, or hemoptysis.  She denies any nausea, vomiting, constipation, or diarrhea.  She has no urinary complaints.  Patient offers no further specific complaints today.  REVIEW OF SYSTEMS:   Review of Systems  Constitutional: Positive for malaise/fatigue. Negative for fever and weight loss.  Eyes: Negative.  Negative for blurred vision, double vision and pain.  Respiratory: Negative.  Negative for cough and shortness of breath.   Cardiovascular: Negative.  Negative for chest pain and leg swelling.  Gastrointestinal: Negative.  Negative for abdominal pain.  Genitourinary: Negative.  Negative for dysuria and flank pain.  Musculoskeletal: Negative for back pain and falls.  Skin: Negative.  Negative for rash.  Neurological: Positive for tingling, sensory change and weakness. Negative for focal weakness and headaches.  Endo/Heme/Allergies: Negative.   Psychiatric/Behavioral: Negative.  Negative for memory loss. The patient is not nervous/anxious and does not have insomnia.     As per HPI. Otherwise, a complete review of systems is  negative.  PAST MEDICAL HISTORY: Past Medical History:  Diagnosis Date   Anxiety    Back pain    occasionally   Breast cancer, left ([place]) 2009   Depression    takes Paxil and Wellbutrin daily   Diabetes ([place])    takes Metformin daily   type 2    Genetic testing 02/03/2017   Multi-Cancer panel (83 genes) @ [facility] - No pathogenic mutations detected   GERD (gastroesophageal reflux disease)    occasional   History of bronchitis 2 yrs ago   History of kidney stones    Hyperlipidemia    takes Atorvastatin daily   Hypertension    no meds   Insomnia    takes Ambien nightly   Insomnia    takes gabapentin nightly   Joint pain    Mood swings    Osteoarthritis of knee    Personal history of chemotherapy 12/05/2016   Mets from Breast Cancer   Personal history of radiation therapy 11/2016   Pneumonia    PONV (postoperative nausea and vomiting)    Seasonal allergies    takes Allegra daily    PAST SURGICAL HISTORY: Past Surgical History:  Procedure Laterality Date   BREAST BIOPSY  2009   cataract surgery Bilateral    COLONOSCOPY     IR RADIOLOGIST EVAL & MGMT  01/29/2018   JOINT REPLACEMENT Left 2014   knee   KNEE ARTHROSCOPY Left    x 5   MASTECTOMY Left    port a cath placed     PORTACATH PLACEMENT N/A 09/17/2015   Procedure: INSERTION PORT-A-CATH;  Surgeon: Jules Husbands, MD;  Location: [facility];  Service: General;  Laterality: N/A;   RADIOLOGY WITH ANESTHESIA N/A 02/20/2018  Procedure: CT WITH ANESTHESIA MICROWAVE THERMAL ABLATION-LIVER;  Surgeon: Jacqulynn Cadet, MD;  Location: [facility];  Service: Anesthesiology;  Laterality: N/A;   TOOTH EXTRACTION     TOTAL SHOULDER ARTHROPLASTY Right 06/03/2015   Procedure: TOTAL SHOULDER ARTHROPLASTY;  Surgeon: Tania Ade, MD;  Location: [place];  Service: Orthopedics;  Laterality: Right;  Right total shoulder arthroplasty   TOTAL SHOULDER REPLACEMENT Right 06/03/2015   TUBAL LIGATION       FAMILY HISTORY: Father with non-Hodgkin's lymphoma, 2 paternal aunts with breast cancer.     ADVANCED DIRECTIVES:    HEALTH MAINTENANCE: Social History   Tobacco Use   Smoking status: Never Smoker   Smokeless tobacco: Never Used  Substance Use Topics   Alcohol use: Yes    Alcohol/week: 0.0 standard drinks    Comment: occasionally wine   Drug use: Yes    Types: Marijuana    Comment: cannabis with no extra  low dose edibles  for neuropathy     Colonoscopy:  PAP:  Bone density:  Lipid panel:  Allergies  Allergen Reactions   Ace Inhibitors Cough   Latex Itching   Morphine And Related Itching    Caused her to itch terribly. Would prefer if given to take with a benadryl   Penicillins Rash    Has patient had a PCN reaction causing immediate rash, facial/tongue/throat swelling, SOB or lightheadedness with hypotension: No Has patient had a PCN reaction causing severe rash involving mucus membranes or skin necrosis: No Has patient had a PCN reaction that required hospitalization No Has patient had a PCN reaction occurring within the last 10 years: No If all of the above answers are "NO", then may proceed with Cephalosporin use.    Current Outpatient Medications  Medication Sig Dispense Refill   atorvastatin (LIPITOR) 10 MG tablet Take 10 mg by mouth at bedtime.      Calcium-Magnesium-Vitamin D (CALCIUM 1200+D3 PO) Take 1 tablet by mouth daily.      fexofenadine (ALLEGRA) 180 MG tablet Take 180 mg by mouth at bedtime.      pantoprazole (PROTONIX) 40 MG tablet Take 40 mg by mouth at bedtime as needed.      PARoxetine (PAXIL) 40 MG tablet Take 40 mg by mouth at bedtime.      prochlorperazine (COMPAZINE) 10 MG tablet TAKE 1 TABLET BY MOUTH EVERY 6 HOURS AS NEEDED FOR  NAUSEA OR  VOMITING 120 tablet 1   sodium chloride (MURO 128) 5 % ophthalmic solution Place 1 drop into both eyes 4 (four) times daily.     acetaminophen (TYLENOL) 500 MG tablet Take 500 mg by  mouth every 6 (six) hours as needed for mild pain or moderate pain.      buPROPion (WELLBUTRIN XL) 150 MG 24 hr tablet Take 1 tablet (150 mg total) by mouth at bedtime. 90 tablet 0   lidocaine-prilocaine (EMLA) cream Apply 1 application topically as needed. Apply to port 1-2 hours prior to chemotherapy appointment. Cover with plastic wrap. (Patient not taking: Reported on 10/28/2018) 30 g 0   metFORMIN (GLUCOPHAGE) 850 MG tablet Take 850 mg by mouth 2 (two) times daily with a meal.      Oxycodone HCl 10 MG TABS Take 1 tablet (10 mg total) by mouth every 4 (four) hours as needed. Ok to take every 4-6 hours as needed. (Patient not taking: Reported on 10/14/2018) 90 tablet 0   zolpidem (AMBIEN) 10 MG tablet Take 1 tablet (10 mg total) by mouth at bedtime as  needed (sleep). (Patient not taking: Reported on 10/14/2018) 90 tablet 0   No current facility-administered medications for this visit.     OBJECTIVE: Vitals:   11/25/18 1147 11/26/18 1106  BP: 125/81 125/81  Pulse: 100 100  Resp: 18 18  Temp: (!) 97.1 F (36.2 C) (!) 97 F (36.1 C)  SpO2: 98% 98%     Body mass index is 24.89 kg/m.    ECOG FS:1 - Symptomatic but completely ambulatory  General: Thin, no acute distress.  Sitting in a wheelchair. Eyes: Pink conjunctiva, anicteric sclera. HEENT: Normocephalic, moist mucous membranes, clear oropharnyx. Lungs: Clear to auscultation bilaterally. Heart: Regular rate and rhythm. No rubs, murmurs, or gallops. Abdomen: Soft, nontender, nondistended. No organomegaly noted, normoactive bowel sounds. Musculoskeletal: No edema, cyanosis, or clubbing. Neuro: Alert, answering all questions appropriately. Cranial nerves grossly intact. Skin: No rashes or petechiae noted. Psych: Normal affect.  LAB RESULTS:  Lab Results  Component Value Date   NA 138 11/26/2018   K 4.4 11/26/2018   CL 109 11/26/2018   CO2 21 (L) 11/26/2018   GLUCOSE 151 (H) 11/26/2018   BUN 14 11/26/2018   CREATININE 0.65  11/26/2018   CALCIUM 8.4 (L) 11/26/2018   PROT 6.3 (L) 11/26/2018   ALBUMIN 3.3 (L) 11/26/2018   AST 50 (H) 11/26/2018   ALT 19 11/26/2018   ALKPHOS 118 11/26/2018   BILITOT 0.6 11/26/2018   GFRNONAA >60 11/26/2018   GFRAA >60 11/26/2018    Lab Results  Component Value Date   WBC 5.7 11/26/2018   NEUTROABS 3.7 11/26/2018   HGB 10.9 (L) 11/26/2018   HCT 35.5 (L) 11/26/2018   MCV 95.7 11/26/2018   PLT 208 11/26/2018     STUDIES: Ct Chest W Contrast  Result Date: 10/28/2018 CLINICAL DATA:  Metastatic breast cancer. EXAM: CT CHEST, ABDOMEN, AND PELVIS WITH CONTRAST TECHNIQUE: Multidetector CT imaging of the chest, abdomen and pelvis was performed following the standard protocol during bolus administration of intravenous contrast. CONTRAST:  68m OMNIPAQUE IOHEXOL 300 MG/ML  SOLN COMPARISON:  07/07/2018 FINDINGS: CT CHEST FINDINGS Cardiovascular: The heart size is normal. No substantial pericardial effusion. Coronary artery calcification is evident. Atherosclerotic calcification is noted in the wall of the thoracic aorta. Right Port-A-Cath tip is in the distal SVC near the RA junction. Mediastinum/Nodes: No mediastinal lymphadenopathy. There is no hilar lymphadenopathy. The esophagus has normal imaging features. There is no axillary lymphadenopathy. Lungs/Pleura: Chronic interstitial changes in the left lung are similar as is the volume loss in left hemithorax. Areas of apparent architectural distortion/scarring are similar to prior. Subpleural reticulation anterior left upper lobe is presumably related to radiation therapy. Small right pleural effusion is mildly progressed in the interval. No suspicious pulmonary nodule or mass. Musculoskeletal: Status post left mastectomy with similar appearance of chronic fluid collection in the mastectomy bed. Status post right shoulder replacement. Sclerotic bone metastases again noted, similar to prior 1.7 cm sclerotic lesion in the T11 vertebral body is  similar to prior. CT ABDOMEN PELVIS FINDINGS Hepatobiliary: Similar appearance of liver metastases. 1 of the more dominant lesions in the right liver (image 58/3) measures 2.3 cm today compared to 2.3 cm when remeasured in a similar fashion on the prior study. Gallbladder decompressed. No intrahepatic or extrahepatic biliary dilation. Pancreas: No focal mass lesion. No dilatation of the main duct. No intraparenchymal cyst. No peripancreatic edema. Spleen: Small area of capsular retraction in the inferior spleen is stable. Adrenals/Urinary Tract: No adrenal nodule or mass. 1-2 mm nonobstructing stone  noted lower pole right kidney. Central sinus cysts noted left kidney, similar to prior. No evidence for hydroureter. The urinary bladder appears normal for the degree of distention. Stomach/Bowel: Stomach is unremarkable. No gastric wall thickening. No evidence of outlet obstruction. Duodenum is normally positioned as is the ligament of Treitz. No small bowel wall thickening. No small bowel dilatation. The terminal ileum is normal. The appendix is not visualized, but there is no edema or inflammation in the region of the cecum. No gross colonic mass. No colonic wall thickening. Large stool volume noted in the rectum. Vascular/Lymphatic: There is abdominal aortic atherosclerosis without aneurysm. There is no gastrohepatic or hepatoduodenal ligament lymphadenopathy. No intraperitoneal or retroperitoneal lymphadenopathy. No pelvic sidewall lymphadenopathy. Reproductive: Unremarkable. Other: No intraperitoneal free fluid. Musculoskeletal: Widespread bony metastatic involvement appears similar to prior. 8 mm index lesion in the right sacrum (93/3) is stable. IMPRESSION: 1. Essentially stable exam. Slight increase in the small left pleural effusion with otherwise no new or progressive findings in the chest, abdomen, or pelvis. 2. Stable appearance of diffuse bony metastatic involvement. 3. Similar appearance of the hepatic  metastases. 4. Coronary artery and thoracoabdominal aortic atherosclerosis. 5. Nonobstructing right renal stone. Aortic Atherosclerosis (ICD10-I70.0). Electronically Signed   By: Misty Stanley M.D.   On: 10/28/2018 12:40   Ct Abdomen Pelvis W Contrast  Result Date: 10/28/2018 CLINICAL DATA:  Metastatic breast cancer. EXAM: CT CHEST, ABDOMEN, AND PELVIS WITH CONTRAST TECHNIQUE: Multidetector CT imaging of the chest, abdomen and pelvis was performed following the standard protocol during bolus administration of intravenous contrast. CONTRAST:  35m OMNIPAQUE IOHEXOL 300 MG/ML  SOLN COMPARISON:  07/07/2018 FINDINGS: CT CHEST FINDINGS Cardiovascular: The heart size is normal. No substantial pericardial effusion. Coronary artery calcification is evident. Atherosclerotic calcification is noted in the wall of the thoracic aorta. Right Port-A-Cath tip is in the distal SVC near the RA junction. Mediastinum/Nodes: No mediastinal lymphadenopathy. There is no hilar lymphadenopathy. The esophagus has normal imaging features. There is no axillary lymphadenopathy. Lungs/Pleura: Chronic interstitial changes in the left lung are similar as is the volume loss in left hemithorax. Areas of apparent architectural distortion/scarring are similar to prior. Subpleural reticulation anterior left upper lobe is presumably related to radiation therapy. Small right pleural effusion is mildly progressed in the interval. No suspicious pulmonary nodule or mass. Musculoskeletal: Status post left mastectomy with similar appearance of chronic fluid collection in the mastectomy bed. Status post right shoulder replacement. Sclerotic bone metastases again noted, similar to prior 1.7 cm sclerotic lesion in the T11 vertebral body is similar to prior. CT ABDOMEN PELVIS FINDINGS Hepatobiliary: Similar appearance of liver metastases. 1 of the more dominant lesions in the right liver (image 58/3) measures 2.3 cm today compared to 2.3 cm when remeasured  in a similar fashion on the prior study. Gallbladder decompressed. No intrahepatic or extrahepatic biliary dilation. Pancreas: No focal mass lesion. No dilatation of the main duct. No intraparenchymal cyst. No peripancreatic edema. Spleen: Small area of capsular retraction in the inferior spleen is stable. Adrenals/Urinary Tract: No adrenal nodule or mass. 1-2 mm nonobstructing stone noted lower pole right kidney. Central sinus cysts noted left kidney, similar to prior. No evidence for hydroureter. The urinary bladder appears normal for the degree of distention. Stomach/Bowel: Stomach is unremarkable. No gastric wall thickening. No evidence of outlet obstruction. Duodenum is normally positioned as is the ligament of Treitz. No small bowel wall thickening. No small bowel dilatation. The terminal ileum is normal. The appendix is not visualized, but there  is no edema or inflammation in the region of the cecum. No gross colonic mass. No colonic wall thickening. Large stool volume noted in the rectum. Vascular/Lymphatic: There is abdominal aortic atherosclerosis without aneurysm. There is no gastrohepatic or hepatoduodenal ligament lymphadenopathy. No intraperitoneal or retroperitoneal lymphadenopathy. No pelvic sidewall lymphadenopathy. Reproductive: Unremarkable. Other: No intraperitoneal free fluid. Musculoskeletal: Widespread bony metastatic involvement appears similar to prior. 8 mm index lesion in the right sacrum (93/3) is stable. IMPRESSION: 1. Essentially stable exam. Slight increase in the small left pleural effusion with otherwise no new or progressive findings in the chest, abdomen, or pelvis. 2. Stable appearance of diffuse bony metastatic involvement. 3. Similar appearance of the hepatic metastases. 4. Coronary artery and thoracoabdominal aortic atherosclerosis. 5. Nonobstructing right renal stone. Aortic Atherosclerosis (ICD10-I70.0). Electronically Signed   By: Misty Stanley M.D.   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  PLAN:    1.  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  The majority of her known malignancy is confined to her liver.  There are no other obvious sites of disease. MRI of the brain on Jun 03, 2018 did not reveal metastatic disease.  Although hospice was previously discussed as well as the possibility of gemcitabine being her only therapeutic option, given her continued improving performance status, this may no longer be the case.  Given her history of significant pancytopenia, she is only able to tolerate gemcitabine every other week.  Proceed with cycle 13 of treatment today.  Return to clinic in 2 weeks for further evaluation and consideration of cycle 14.  Plan to reimage in January 2021.   2.  Anemia: Chronic and unchanged.  Patient's hemoglobin is 10.9. 3.  Peripheral neuropathy: Chronic and unchanged.   4.  Back pain: MRI results from May 04, 2017 did not reveal metastatic disease.  Continue symptomatic treatment.  5.  Left IJ clot: Ultrasound results reviewed independently.  Patient has been instructed to discontinue Eliquis. 6.  Neutropenia: Resolved. 7.  Memory complaints/confusion: Repeat MRI of the brain on Jun 03, 2018 reviewed independently with no obvious intracranial abnormality.  Patient continues to have progressive and persistent osseous metastatic disease. 8.  Elevated liver enzymes: Chronic and unchanged.  AST remains mildly elevated at 50. 9.  Apparent aspiration: Resolved. 10.  Poor appetite: Improved.  Patient is now gaining weight. 11.  Insomnia: Improved.  Continue Ambien as prescribed.   12.  Bilateral lung opacities: Resolved. 13.  Weakness and fatigue: Patient was previously given a referral to the CARE  program.  Patient expressed understanding and was in agreement with this plan. She also understands that She can call clinic at any time with any questions, concerns, or complaints.   Breast cancer   Staging form: Breast, AJCC 7th Edition     Pathologic stage from 08/11/2014: Stage IIIA (T0, N2a, cM0) - Signed by Lloyd Huger, MD on 08/11/2014   Lloyd Huger, MD   11/26/2018 1:24 PM

## 2018-11-25 ENCOUNTER — Other Ambulatory Visit: Payer: Self-pay | Admitting: Emergency Medicine

## 2018-11-25 ENCOUNTER — Other Ambulatory Visit: Payer: Self-pay

## 2018-11-25 ENCOUNTER — Encounter: Payer: Self-pay | Admitting: Oncology

## 2018-11-25 DIAGNOSIS — C50912 Malignant neoplasm of unspecified site of left female breast: Secondary | ICD-10-CM

## 2018-11-25 MED ORDER — BUPROPION HCL ER (XL) 150 MG PO TB24: 150.0000 mg | ORAL_TABLET | Freq: Every day | ORAL | 0 refills | Status: AC

## 2018-11-25 NOTE — Progress Notes (Signed)
Patient prescreened for appointment. Needing refill on Wellbutrin.

## 2018-11-25 NOTE — Telephone Encounter (Signed)
Spoke with Dr. Grayland Ormond regarding pt's husband requesting refill of wellbutrin. Refill sent to [facility] for 3 month supply.

## 2018-11-26 ENCOUNTER — Encounter: Payer: Self-pay | Admitting: Oncology

## 2018-11-26 ENCOUNTER — Other Ambulatory Visit: Payer: Self-pay

## 2018-11-26 ENCOUNTER — Inpatient Hospital Stay: Payer: Medicare Other

## 2018-11-26 ENCOUNTER — Inpatient Hospital Stay: Payer: Medicare Other | Attending: Oncology

## 2018-11-26 ENCOUNTER — Inpatient Hospital Stay (HOSPITAL_BASED_OUTPATIENT_CLINIC_OR_DEPARTMENT_OTHER): Payer: Medicare Other | Admitting: Oncology

## 2018-11-26 VITALS — BP 125/81 | HR 100 | Temp 97.0°F | Resp 18 | Wt 145.0 lb

## 2018-11-26 VITALS — BP 134/80 | HR 86 | Temp 97.2°F | Resp 20

## 2018-11-26 DIAGNOSIS — C787 Secondary malignant neoplasm of liver and intrahepatic bile duct: Secondary | ICD-10-CM | POA: Insufficient documentation

## 2018-11-26 DIAGNOSIS — Z5111 Encounter for antineoplastic chemotherapy: Secondary | ICD-10-CM | POA: Insufficient documentation

## 2018-11-26 DIAGNOSIS — C7951 Secondary malignant neoplasm of bone: Secondary | ICD-10-CM | POA: Diagnosis not present

## 2018-11-26 DIAGNOSIS — C50912 Malignant neoplasm of unspecified site of left female breast: Secondary | ICD-10-CM

## 2018-11-26 DIAGNOSIS — Z170 Estrogen receptor positive status [ER+]: Secondary | ICD-10-CM | POA: Insufficient documentation

## 2018-11-26 DIAGNOSIS — C50919 Malignant neoplasm of unspecified site of unspecified female breast: Secondary | ICD-10-CM | POA: Insufficient documentation

## 2018-11-26 LAB — CBC WITH DIFFERENTIAL/PLATELET
Abs Immature Granulocytes: 0.07 10*3/uL (ref 0.00–0.07)
Basophils Absolute: 0 10*3/uL (ref 0.0–0.1)
Basophils Relative: 1 %
Eosinophils Absolute: 0.2 10*3/uL (ref 0.0–0.5)
Eosinophils Relative: 4 %
HCT: 35.5 % — ABNORMAL LOW (ref 36.0–46.0)
Hemoglobin: 10.9 g/dL — ABNORMAL LOW (ref 12.0–15.0)
Immature Granulocytes: 1 %
Lymphocytes Relative: 12 %
Lymphs Abs: 0.7 10*3/uL (ref 0.7–4.0)
MCH: 29.4 pg (ref 26.0–34.0)
MCHC: 30.7 g/dL (ref 30.0–36.0)
MCV: 95.7 fL (ref 80.0–100.0)
Monocytes Absolute: 0.9 10*3/uL (ref 0.1–1.0)
Monocytes Relative: 16 %
Neutro Abs: 3.7 10*3/uL (ref 1.7–7.7)
Neutrophils Relative %: 66 %
Platelets: 208 10*3/uL (ref 150–400)
RBC: 3.71 MIL/uL — ABNORMAL LOW (ref 3.87–5.11)
RDW: 21.3 % — ABNORMAL HIGH (ref 11.5–15.5)
WBC: 5.7 10*3/uL (ref 4.0–10.5)
nRBC: 0 % (ref 0.0–0.2)

## 2018-11-26 LAB — COMPREHENSIVE METABOLIC PANEL
ALT: 19 U/L (ref 0–44)
AST: 50 U/L — ABNORMAL HIGH (ref 15–41)
Albumin: 3.3 g/dL — ABNORMAL LOW (ref 3.5–5.0)
Alkaline Phosphatase: 118 U/L (ref 38–126)
Anion gap: 8 (ref 5–15)
BUN: 14 mg/dL (ref 8–23)
CO2: 21 mmol/L — ABNORMAL LOW (ref 22–32)
Calcium: 8.4 mg/dL — ABNORMAL LOW (ref 8.9–10.3)
Chloride: 109 mmol/L (ref 98–111)
Creatinine, Ser: 0.65 mg/dL (ref 0.44–1.00)
GFR calc Af Amer: >60 mL/min (ref >60)
GFR calc non Af Amer: >60 mL/min (ref >60)
Glucose, Bld: 151 mg/dL — ABNORMAL HIGH (ref 70–99)
Potassium: 4.4 mmol/L (ref 3.5–5.1)
Sodium: 138 mmol/L (ref 135–145)
Total Bilirubin: 0.6 mg/dL (ref 0.3–1.2)
Total Protein: 6.3 g/dL — ABNORMAL LOW (ref 6.5–8.1)

## 2018-11-26 MED ORDER — SODIUM CHLORIDE 0.9 % IV SOLN
1400.0000 mg | Freq: Once | INTRAVENOUS | Status: AC
  Administered 2018-11-26: 1400 mg via INTRAVENOUS
  Filled 2018-11-26: qty 26.3

## 2018-11-26 MED ORDER — SODIUM CHLORIDE 0.9% FLUSH
10.0000 mL | Freq: Once | INTRAVENOUS | Status: AC
  Administered 2018-11-26: 10 mL via INTRAVENOUS
  Filled 2018-11-26: qty 10

## 2018-11-26 MED ORDER — HEPARIN SOD (PORK) LOCK FLUSH 100 UNIT/ML IV SOLN
500.0000 [IU] | Freq: Once | INTRAVENOUS | Status: AC
  Administered 2018-11-26: 500 [IU] via INTRAVENOUS
  Filled 2018-11-26: qty 5

## 2018-11-26 MED ORDER — SODIUM CHLORIDE 0.9 % IV SOLN
Freq: Once | INTRAVENOUS | Status: AC
  Administered 2018-11-26: 11:00:00 via INTRAVENOUS
  Filled 2018-11-26: qty 250

## 2018-11-26 MED ORDER — PROCHLORPERAZINE MALEATE 10 MG PO TABS
10.0000 mg | ORAL_TABLET | Freq: Once | ORAL | Status: AC
  Administered 2018-11-26: 10 mg via ORAL
  Filled 2018-11-26: qty 1

## 2018-12-06 NOTE — Progress Notes (Signed)
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  INTERVAL HISTORY: Patient returns to clinic today for further evaluation and consideration of cycle 14 of single agent gemcitabine.  She continues to tolerate her treatments well without significant side effects.  She continues to have weakness and fatigue. She denies any pain.  She has chronic peripheral neuropathy, but no other neurologic complaints. She denies any recent fevers or illnesses.  She denies any chest pain, shortness of breath, cough, or hemoptysis.  She denies any nausea, vomiting, constipation, or diarrhea.  She has no urinary complaints.  Patient offers no further specific complaints today.    REVIEW OF SYSTEMS:   Review of Systems  Constitutional: Positive for malaise/fatigue. Negative for fever and weight loss.  Eyes: Negative.  Negative for blurred vision, double vision and pain.  Respiratory: Negative.  Negative for cough and shortness of breath.   Cardiovascular: Negative.  Negative for chest pain and leg swelling.  Gastrointestinal: Negative.  Negative for abdominal pain.  Genitourinary: Negative.  Negative for dysuria and flank pain.  Musculoskeletal: Negative for back pain and falls.  Skin: Negative.  Negative for rash.  Neurological: Positive for tingling, sensory change and weakness. Negative for focal weakness and headaches.  Endo/Heme/Allergies: Negative.   Psychiatric/Behavioral: Negative.  Negative for memory loss. The patient is not nervous/anxious and does not have insomnia.     As per HPI. Otherwise, a complete review of systems is negative.  PAST MEDICAL HISTORY: Past  Medical History:  Diagnosis Date  . Anxiety   . Back pain    occasionally  . Breast cancer, left ([facility]) 2009  . Depression    takes Paxil and Wellbutrin daily  . Diabetes ([place])    takes Metformin daily   type 2   . Genetic testing 02/03/2017   Multi-Cancer panel (83 genes) @ [facility] - No pathogenic mutations detected  . GERD (gastroesophageal reflux disease)    occasional  . History of bronchitis 2 yrs ago  . History of kidney stones   . Hyperlipidemia    takes Atorvastatin daily  . Hypertension    no meds  . Insomnia    takes Ambien nightly  . Insomnia    takes gabapentin nightly  . Joint pain   . Mood swings   . Osteoarthritis of knee   . Personal history of chemotherapy 12/05/2016   Mets from Breast Cancer  . Personal history of radiation therapy 11/2016  . Pneumonia   . PONV (postoperative nausea and vomiting)   . Seasonal allergies    takes Allegra daily    PAST SURGICAL HISTORY: Past Surgical History:  Procedure Laterality Date  . BREAST BIOPSY  2009  . cataract surgery Bilateral   . COLONOSCOPY    . IR RADIOLOGIST EVAL & MGMT  01/29/2018  . JOINT REPLACEMENT Left 2014   knee  . KNEE ARTHROSCOPY Left    x 5  . MASTECTOMY Left   . port a cath placed    . PORTACATH PLACEMENT N/A 09/17/2015   Procedure: INSERTION PORT-A-CATH;  Surgeon: Jules Husbands, MD;  Location: [facility] ORS;  Service: General;  Laterality: N/A;  . RADIOLOGY WITH ANESTHESIA N/A 02/20/2018   Procedure: CT  WITH ANESTHESIA MICROWAVE THERMAL ABLATION-LIVER;  Surgeon: Jacqulynn Cadet, MD;  Location: [facility];  Service: Anesthesiology;  Laterality: N/A;  . TOOTH EXTRACTION    . TOTAL SHOULDER ARTHROPLASTY Right 06/03/2015   Procedure: TOTAL SHOULDER ARTHROPLASTY;  Surgeon: Tania Ade, MD;  Location: [facility];  Service: Orthopedics;  Laterality: Right;  Right total shoulder arthroplasty  . TOTAL SHOULDER REPLACEMENT Right 06/03/2015  . TUBAL LIGATION      FAMILY HISTORY: Father with non-Hodgkin's  lymphoma, 2 paternal aunts with breast cancer.     ADVANCED DIRECTIVES:    HEALTH MAINTENANCE: Social History   Tobacco Use  . Smoking status: Never Smoker  . Smokeless tobacco: Never Used  Substance Use Topics  . Alcohol use: Yes    Alcohol/week: 0.0 standard drinks    Comment: occasionally wine  . Drug use: Yes    Types: Marijuana    Comment: cannabis with no extra  low dose edibles  for neuropathy     Colonoscopy:  PAP:  Bone density:  Lipid panel:  Allergies  Allergen Reactions  . Ace Inhibitors Cough  . Latex Itching  . Morphine And Related Itching    Caused her to itch terribly. Would prefer if given to take with a benadryl  . Penicillins Rash    Has patient had a PCN reaction causing immediate rash, facial/tongue/throat swelling, SOB or lightheadedness with hypotension: No Has patient had a PCN reaction causing severe rash involving mucus membranes or skin necrosis: No Has patient had a PCN reaction that required hospitalization No Has patient had a PCN reaction occurring within the last 10 years: No If all of the above answers are "NO", then may proceed with Cephalosporin use.    Current Outpatient Medications  Medication Sig Dispense Refill  . acetaminophen (TYLENOL) 500 MG tablet Take 500 mg by mouth every 6 (six) hours as needed for mild pain or moderate pain.     Marland Kitchen atorvastatin (LIPITOR) 10 MG tablet Take 10 mg by mouth at bedtime.     Marland Kitchen buPROPion (WELLBUTRIN XL) 150 MG 24 hr tablet Take 1 tablet (150 mg total) by mouth at bedtime. 90 tablet 0  . Calcium-Magnesium-Vitamin D (CALCIUM 1200+D3 PO) Take 1 tablet by mouth daily.     . fexofenadine (ALLEGRA) 180 MG tablet Take 180 mg by mouth at bedtime.     . lidocaine-prilocaine (EMLA) cream Apply 1 application topically as needed. Apply to port 1-2 hours prior to chemotherapy appointment. Cover with plastic wrap. 30 g 0  . metFORMIN (GLUCOPHAGE) 850 MG tablet Take 850 mg by mouth 2 (two) times daily with a  meal.     . Oxycodone HCl 10 MG TABS Take 1 tablet (10 mg total) by mouth every 4 (four) hours as needed. Ok to take every 4-6 hours as needed. 90 tablet 0  . pantoprazole (PROTONIX) 40 MG tablet Take 40 mg by mouth at bedtime as needed.     Marland Kitchen PARoxetine (PAXIL) 40 MG tablet Take 40 mg by mouth at bedtime.     . prochlorperazine (COMPAZINE) 10 MG tablet TAKE 1 TABLET BY MOUTH EVERY 6 HOURS AS NEEDED FOR  NAUSEA OR  VOMITING 120 tablet 1  . sodium chloride (MURO 128) 5 % ophthalmic solution Place 1 drop into both eyes 4 (four) times daily.    Marland Kitchen zolpidem (AMBIEN) 10 MG tablet Take 1 tablet (10 mg total) by mouth at bedtime as needed (sleep). 90 tablet 0   No current facility-administered medications for this visit.  OBJECTIVE: Vitals:   12/10/18 1316  BP: 121/77  Pulse: 94  Resp: 20  Temp: 98.1 F (36.7 C)  SpO2: 100%     Body mass index is 24.72 kg/m.    ECOG FS:1 - Symptomatic but completely ambulatory  General: Well-developed, well-nourished, no acute distress.  Sitting in a wheelchair. Eyes: Pink conjunctiva, anicteric sclera. HEENT: Normocephalic, moist mucous membranes. Lungs: Clear to auscultation bilaterally. Heart: Regular rate and rhythm. No rubs, murmurs, or gallops. Abdomen: Soft, nontender, nondistended. No organomegaly noted, normoactive bowel sounds. Musculoskeletal: No edema, cyanosis, or clubbing. Neuro: Alert, answering all questions appropriately. Cranial nerves grossly intact. Skin: No rashes or petechiae noted. Psych: Normal affect.  LAB RESULTS:  Lab Results  Component Value Date   NA 138 12/10/2018   K 4.4 12/10/2018   CL 108 12/10/2018   CO2 24 12/10/2018   GLUCOSE 147 (H) 12/10/2018   BUN 15 12/10/2018   CREATININE 0.67 12/10/2018   CALCIUM 8.6 (L) 12/10/2018   PROT 6.4 (L) 12/10/2018   ALBUMIN 3.3 (L) 12/10/2018   AST 53 (H) 12/10/2018   ALT 21 12/10/2018   ALKPHOS 147 (H) 12/10/2018   BILITOT 0.5 12/10/2018   GFRNONAA >60 12/10/2018    GFRAA >60 12/10/2018    Lab Results  Component Value Date   WBC 5.6 12/10/2018   NEUTROABS 3.4 12/10/2018   HGB 11.4 (L) 12/10/2018   HCT 37.7 12/10/2018   MCV 98.4 12/10/2018   PLT 247 12/10/2018     STUDIES: No results found.  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  PLAN:    1.  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  The majority of her known malignancy is confined to her liver.  There are no other obvious sites of disease. MRI of the brain on Jun 03, 2018 did not reveal metastatic disease.  Although hospice was previously discussed as well as the possibility of gemcitabine being her only therapeutic option, given her continued improving performance status, this may no longer be the case.  Given her history of significant pancytopenia, she is only able to tolerate gemcitabine every other week.  Proceed with cycle 14 of treatment today.  Return to clinic in 2 weeks for further evaluation and consideration of cycle 15. Plan to reimage in January 2021.   2.  Anemia: Hemoglobin continues to trend up and is now 11.7. 3.  Peripheral neuropathy: Chronic and unchanged.   4.  Back pain: MRI results from May 04, 2017 did not reveal metastatic disease.  Continue symptomatic treatment.  5.  Left IJ clot: Ultrasound results reviewed independently.  Patient has been instructed to discontinue Eliquis. 6.  Neutropenia: Resolved. 7.  Memory complaints/confusion: Patient does not complain of this today.  Repeat MRI of the brain on Jun 03, 2018 reviewed independently with no obvious intracranial abnormality.  Patient continues to have progressive and persistent osseous metastatic disease. 8.  Elevated liver enzymes: Chronic and unchanged.  AST remains mildly elevated at 53. 9.  Insomnia: Improved.   Continue Ambien as prescribed.   10.  Weakness and fatigue: Patient was previously given a referral to the CARE program.  Patient expressed understanding and was in agreement with this plan. She also understands that She can call clinic at any time with any questions, concerns, or complaints.   Breast cancer   Staging form:   Breast, AJCC 7th Edition     Pathologic stage from 08/11/2014: Stage IIIA (T0, N2a, cM0) - Signed by Timothy J Finnegan, MD on 08/11/2014   Timothy J Finnegan, MD   12/10/2018 6:07 PM     

## 2018-12-09 ENCOUNTER — Other Ambulatory Visit: Payer: Self-pay

## 2018-12-09 NOTE — Progress Notes (Signed)
Patient pre screened for office appointment, no questions or concerns today. Patient reminded of upcoming appointment time and date. 

## 2018-12-10 ENCOUNTER — Inpatient Hospital Stay (HOSPITAL_BASED_OUTPATIENT_CLINIC_OR_DEPARTMENT_OTHER): Payer: Medicare Other | Admitting: Oncology

## 2018-12-10 ENCOUNTER — Inpatient Hospital Stay: Payer: Medicare Other

## 2018-12-10 ENCOUNTER — Encounter: Payer: Self-pay | Admitting: Oncology

## 2018-12-10 ENCOUNTER — Other Ambulatory Visit: Payer: Self-pay

## 2018-12-10 VITALS — BP 121/77 | HR 94 | Temp 98.1°F | Resp 20 | Wt 144.0 lb

## 2018-12-10 DIAGNOSIS — C7951 Secondary malignant neoplasm of bone: Secondary | ICD-10-CM | POA: Diagnosis not present

## 2018-12-10 DIAGNOSIS — C50919 Malignant neoplasm of unspecified site of unspecified female breast: Secondary | ICD-10-CM

## 2018-12-10 DIAGNOSIS — Z5111 Encounter for antineoplastic chemotherapy: Secondary | ICD-10-CM | POA: Diagnosis not present

## 2018-12-10 DIAGNOSIS — C787 Secondary malignant neoplasm of liver and intrahepatic bile duct: Secondary | ICD-10-CM | POA: Diagnosis not present

## 2018-12-10 DIAGNOSIS — Z170 Estrogen receptor positive status [ER+]: Secondary | ICD-10-CM | POA: Diagnosis not present

## 2018-12-10 DIAGNOSIS — Z95828 Presence of other vascular implants and grafts: Secondary | ICD-10-CM

## 2018-12-10 LAB — COMPREHENSIVE METABOLIC PANEL
ALT: 21 U/L (ref 0–44)
AST: 53 U/L — ABNORMAL HIGH (ref 15–41)
Albumin: 3.3 g/dL — ABNORMAL LOW (ref 3.5–5.0)
Alkaline Phosphatase: 147 U/L — ABNORMAL HIGH (ref 38–126)
Anion gap: 6 (ref 5–15)
BUN: 15 mg/dL (ref 8–23)
CO2: 24 mmol/L (ref 22–32)
Calcium: 8.6 mg/dL — ABNORMAL LOW (ref 8.9–10.3)
Chloride: 108 mmol/L (ref 98–111)
Creatinine, Ser: 0.67 mg/dL (ref 0.44–1.00)
GFR calc Af Amer: >60 mL/min (ref >60)
GFR calc non Af Amer: >60 mL/min (ref >60)
Glucose, Bld: 147 mg/dL — ABNORMAL HIGH (ref 70–99)
Potassium: 4.4 mmol/L (ref 3.5–5.1)
Sodium: 138 mmol/L (ref 135–145)
Total Bilirubin: 0.5 mg/dL (ref 0.3–1.2)
Total Protein: 6.4 g/dL — ABNORMAL LOW (ref 6.5–8.1)

## 2018-12-10 LAB — CBC WITH DIFFERENTIAL/PLATELET
Abs Immature Granulocytes: 0.09 10*3/uL — ABNORMAL HIGH (ref 0.00–0.07)
Basophils Absolute: 0.1 10*3/uL (ref 0.0–0.1)
Basophils Relative: 1 %
Eosinophils Absolute: 0.3 10*3/uL (ref 0.0–0.5)
Eosinophils Relative: 5 %
HCT: 37.7 % (ref 36.0–46.0)
Hemoglobin: 11.4 g/dL — ABNORMAL LOW (ref 12.0–15.0)
Immature Granulocytes: 2 %
Lymphocytes Relative: 15 %
Lymphs Abs: 0.8 10*3/uL (ref 0.7–4.0)
MCH: 29.8 pg (ref 26.0–34.0)
MCHC: 30.2 g/dL (ref 30.0–36.0)
MCV: 98.4 fL (ref 80.0–100.0)
Monocytes Absolute: 1 10*3/uL (ref 0.1–1.0)
Monocytes Relative: 18 %
Neutro Abs: 3.4 10*3/uL (ref 1.7–7.7)
Neutrophils Relative %: 59 %
Platelets: 247 10*3/uL (ref 150–400)
RBC: 3.83 MIL/uL — ABNORMAL LOW (ref 3.87–5.11)
RDW: 19.9 % — ABNORMAL HIGH (ref 11.5–15.5)
WBC: 5.6 10*3/uL (ref 4.0–10.5)
nRBC: 0 % (ref 0.0–0.2)

## 2018-12-10 MED ORDER — SODIUM CHLORIDE 0.9% FLUSH
10.0000 mL | Freq: Once | INTRAVENOUS | Status: AC
  Administered 2018-12-10: 13:00:00 10 mL via INTRAVENOUS
  Filled 2018-12-10: qty 10

## 2018-12-10 MED ORDER — HEPARIN SOD (PORK) LOCK FLUSH 100 UNIT/ML IV SOLN
500.0000 [IU] | Freq: Once | INTRAVENOUS | Status: AC | PRN
  Administered 2018-12-10: 500 [IU]
  Filled 2018-12-10: qty 5

## 2018-12-10 MED ORDER — PROCHLORPERAZINE MALEATE 10 MG PO TABS
10.0000 mg | ORAL_TABLET | Freq: Once | ORAL | Status: AC
  Administered 2018-12-10: 10 mg via ORAL
  Filled 2018-12-10: qty 1

## 2018-12-10 MED ORDER — SODIUM CHLORIDE 0.9 % IV SOLN
Freq: Once | INTRAVENOUS | Status: AC
  Administered 2018-12-10: 14:00:00 via INTRAVENOUS
  Filled 2018-12-10: qty 250

## 2018-12-10 MED ORDER — SODIUM CHLORIDE 0.9 % IV SOLN
1400.0000 mg | Freq: Once | INTRAVENOUS | Status: AC
  Administered 2018-12-10: 1400 mg via INTRAVENOUS
  Filled 2018-12-10: qty 10.52

## 2018-12-16 ENCOUNTER — Other Ambulatory Visit: Payer: Self-pay

## 2018-12-16 ENCOUNTER — Encounter: Payer: Self-pay | Admitting: Nurse Practitioner

## 2018-12-16 ENCOUNTER — Other Ambulatory Visit: Payer: Medicare Other | Admitting: Nurse Practitioner

## 2018-12-16 DIAGNOSIS — Z515 Encounter for palliative care: Secondary | ICD-10-CM | POA: Diagnosis not present
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
## 2018-12-22 NOTE — Progress Notes (Signed)
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  INTERVAL HISTORY: Patient returns to clinic today for further evaluation and consideration of cycle 15 of single agent gemcitabine.  She continues to tolerate her treatments well without significant side effects.  She has chronic weakness and fatigue, but otherwise feels well.  She denies any pain. She has chronic peripheral neuropathy, but no other neurologic complaints. She denies any recent fevers or illnesses.  She denies any chest pain, shortness of breath, cough, or hemoptysis.  She denies any nausea, vomiting, constipation, or diarrhea.  She has no urinary complaints.  Patient offers no further specific complaints today.  REVIEW OF SYSTEMS:   Review of Systems  Constitutional: Positive for malaise/fatigue. Negative for fever and weight loss.  Eyes: Negative.  Negative for blurred vision, double vision and pain.  Respiratory: Negative.  Negative for cough and shortness of breath.   Cardiovascular: Negative.  Negative for chest pain and leg swelling.  Gastrointestinal: Negative.  Negative for abdominal pain.  Genitourinary: Negative.  Negative for dysuria and flank pain.  Musculoskeletal: Negative for back pain and falls.  Skin: Negative.  Negative for rash.  Neurological: Positive for tingling, sensory change and weakness. Negative for focal weakness and headaches.  Endo/Heme/Allergies: Negative.   Psychiatric/Behavioral: Negative.  Negative for memory loss. The patient is not nervous/anxious and does not have insomnia.     As per HPI. Otherwise, a complete review of systems is negative.  PAST  MEDICAL HISTORY: Past Medical History:  Diagnosis Date  . Anxiety   . Back pain    occasionally  . Breast cancer, left ([place]) 2009  . Depression    takes Paxil and Wellbutrin daily  . Diabetes ([place])    takes Metformin daily   type 2   . Genetic testing 02/03/2017   Multi-Cancer panel (83 genes) @ [facility] - No pathogenic mutations detected  . GERD (gastroesophageal reflux disease)    occasional  . History of bronchitis 2 yrs ago  . History of kidney stones   . Hyperlipidemia    takes Atorvastatin daily  . Hypertension    no meds  . Insomnia    takes Ambien nightly  . Insomnia    takes gabapentin nightly  . Joint pain   . Mood swings   . Osteoarthritis of knee   . Personal history of chemotherapy 12/05/2016   Mets from Breast Cancer  . Personal history of radiation therapy 11/2016  . Pneumonia   . PONV (postoperative nausea and vomiting)   . Seasonal allergies    takes Allegra daily    PAST SURGICAL HISTORY: Past Surgical History:  Procedure Laterality Date  . BREAST BIOPSY  2009  . cataract surgery Bilateral   . COLONOSCOPY    . IR RADIOLOGIST EVAL & MGMT  01/29/2018  . JOINT REPLACEMENT Left 2014   knee  . KNEE ARTHROSCOPY Left    x 5  . MASTECTOMY Left   . port a cath placed    . PORTACATH PLACEMENT N/A 09/17/2015   Procedure: INSERTION PORT-A-CATH;  Surgeon: Jules Husbands, MD;  Location: [facility];  Service: General;  Laterality: N/A;  . RADIOLOGY WITH ANESTHESIA N/A 02/20/2018   Procedure:  CT WITH ANESTHESIA MICROWAVE THERMAL ABLATION-LIVER;  Surgeon: Jacqulynn Cadet, MD;  Location: [facility];  Service: Anesthesiology;  Laterality: N/A;  . TOOTH EXTRACTION    . TOTAL SHOULDER ARTHROPLASTY Right 06/03/2015   Procedure: TOTAL SHOULDER ARTHROPLASTY;  Surgeon: Tania Ade, MD;  Location: [place];  Service: Orthopedics;  Laterality: Right;  Right total shoulder arthroplasty  . TOTAL SHOULDER REPLACEMENT Right 06/03/2015  . TUBAL LIGATION      FAMILY HISTORY: Father  with non-Hodgkin's lymphoma, 2 paternal aunts with breast cancer.     ADVANCED DIRECTIVES:    HEALTH MAINTENANCE: Social History   Tobacco Use  . Smoking status: Never Smoker  . Smokeless tobacco: Never Used  Substance Use Topics  . Alcohol use: Yes    Alcohol/week: 0.0 standard drinks    Comment: occasionally wine  . Drug use: Yes    Types: Marijuana    Comment: cannabis with no extra  low dose edibles  for neuropathy     Colonoscopy:  PAP:  Bone density:  Lipid panel:  Allergies  Allergen Reactions  . Ace Inhibitors Cough  . Latex Itching  . Morphine And Related Itching    Caused her to itch terribly. Would prefer if given to take with a benadryl  . Penicillins Rash    Has patient had a PCN reaction causing immediate rash, facial/tongue/throat swelling, SOB or lightheadedness with hypotension: No Has patient had a PCN reaction causing severe rash involving mucus membranes or skin necrosis: No Has patient had a PCN reaction that required hospitalization No Has patient had a PCN reaction occurring within the last 10 years: No If all of the above answers are "NO", then may proceed with Cephalosporin use.    Current Outpatient Medications  Medication Sig Dispense Refill  . acetaminophen (TYLENOL) 500 MG tablet Take 500 mg by mouth every 6 (six) hours as needed for mild pain or moderate pain.     Marland Kitchen atorvastatin (LIPITOR) 10 MG tablet Take 10 mg by mouth at bedtime.     Marland Kitchen buPROPion (WELLBUTRIN XL) 150 MG 24 hr tablet Take 1 tablet (150 mg total) by mouth at bedtime. 90 tablet 0  . Calcium-Magnesium-Vitamin D (CALCIUM 1200+D3 PO) Take 1 tablet by mouth daily.     . fexofenadine (ALLEGRA) 180 MG tablet Take 180 mg by mouth at bedtime.     . metFORMIN (GLUCOPHAGE) 850 MG tablet Take 850 mg by mouth 2 (two) times daily with a meal.     . Oxycodone HCl 10 MG TABS Take 1 tablet (10 mg total) by mouth every 4 (four) hours as needed. Ok to take every 4-6 hours as needed. 90 tablet  0  . pantoprazole (PROTONIX) 40 MG tablet Take 40 mg by mouth at bedtime as needed.     Marland Kitchen PARoxetine (PAXIL) 40 MG tablet Take 40 mg by mouth at bedtime.     . prochlorperazine (COMPAZINE) 10 MG tablet TAKE 1 TABLET BY MOUTH EVERY 6 HOURS AS NEEDED FOR  NAUSEA OR  VOMITING 120 tablet 1  . sodium chloride (MURO 128) 5 % ophthalmic solution Place 1 drop into both eyes 4 (four) times daily.    Marland Kitchen zolpidem (AMBIEN) 10 MG tablet Take 1 tablet (10 mg total) by mouth at bedtime as needed (sleep). 90 tablet 0  . lidocaine-prilocaine (EMLA) cream Apply 1 application topically as needed. Apply to port 1-2 hours prior to chemotherapy appointment. Cover with plastic wrap. (Patient not taking: Reported on 12/24/2018) 30 g 0  No current facility-administered medications for this visit.    OBJECTIVE: Vitals:   12/24/18 1323  BP: 124/79  Pulse: 100  Resp: 17  Temp: (!) 97.3 F (36.3 C)  SpO2: 99%     Body mass index is 24.37 kg/m.    ECOG FS:1 - Symptomatic but completely ambulatory  General: Well-developed, well-nourished, no acute distress.  Sitting in a wheelchair.   Eyes: Pink conjunctiva, anicteric sclera. HEENT: Normocephalic, moist mucous membranes. Lungs: No audible wheezing or coughing. Heart: Regular. Abdomen: Soft, nontender, nondistended. No organomegaly noted, normoactive bowel sounds. Musculoskeletal: No edema, cyanosis, or clubbing. Neuro: Alert, answering all questions appropriately. Cranial nerves grossly intact. Skin: No rashes or petechiae noted. Psych: Normal affect.  LAB RESULTS:  Lab Results  Component Value Date   NA 139 12/24/2018   K 3.5 12/24/2018   CL 111 12/24/2018   CO2 23 12/24/2018   GLUCOSE 154 (H) 12/24/2018   BUN 12 12/24/2018   CREATININE 0.59 12/24/2018   CALCIUM 7.6 (L) 12/24/2018   PROT 5.3 (L) 12/24/2018   ALBUMIN 2.7 (L) 12/24/2018   AST 45 (H) 12/24/2018   ALT 18 12/24/2018   ALKPHOS 120 12/24/2018   BILITOT 0.5 12/24/2018   GFRNONAA >60  12/24/2018   GFRAA >60 12/24/2018    Lab Results  Component Value Date   WBC 4.9 12/24/2018   NEUTROABS 3.3 12/24/2018   HGB 10.8 (L) 12/24/2018   HCT 35.7 (L) 12/24/2018   MCV 100.3 (H) 12/24/2018   PLT 195 12/24/2018     STUDIES: No results found.  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  PLAN:    1.  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  The majority of her known malignancy is confined to her liver.  There are no other obvious sites of disease. MRI of the brain on Jun 03, 2018 did not reveal metastatic disease.  Although hospice was previously discussed as well as the possibility of gemcitabine being her only therapeutic option, given her continued improving performance status, this may no longer be the case.  Given her history of significant pancytopenia, she is only able to tolerate gemcitabine every other week.  Proceed with cycle 15 of gemcitabine today.  Return to clinic in 2 weeks for further evaluation and consideration of cycle 16.  Plan to reimage in January 2021.  2.  Anemia: Hemoglobin has trended down slightly to 10.8, monitor. 3.  Peripheral neuropathy: Chronic and unchanged.   4.  Back pain: MRI results from May 04, 2017 did not reveal metastatic disease.  Continue symptomatic treatment.  5.  Left IJ clot: Ultrasound results reviewed independently.  Patient has been instructed to discontinue Eliquis. 6.  Neutropenia: Resolved. 7.  Memory complaints/confusion: Patient does not complain of this today.  Repeat MRI of the brain on Jun 03, 2018 reviewed independently with no obvious intracranial abnormality.  Patient continues to have progressive and persistent osseous metastatic disease. 8.  Elevated liver enzymes: Chronic and unchanged.  AST remains mildly elevated at 45.  9.  Insomnia: Improved.  Continue Ambien as prescribed.   10.  Weakness and fatigue: Patient was previously given a referral to the CARE program.  Patient expressed understanding and was in agreement with this plan. She also understands that She can call clinic at any time with any questions, concerns, or complaints.  Breast cancer   Staging form: Breast, AJCC 7th Edition     Pathologic stage from 08/11/2014: Stage IIIA (T0, N2a, cM0) - Signed by Lloyd Huger, MD on 08/11/2014   Lloyd Huger, MD   12/26/2018 6:10 AM

## 2018-12-24 ENCOUNTER — Inpatient Hospital Stay (HOSPITAL_BASED_OUTPATIENT_CLINIC_OR_DEPARTMENT_OTHER): Payer: Medicare Other | Admitting: Oncology

## 2018-12-24 ENCOUNTER — Other Ambulatory Visit: Payer: Self-pay

## 2018-12-24 ENCOUNTER — Inpatient Hospital Stay: Payer: Medicare Other

## 2018-12-24 ENCOUNTER — Encounter: Payer: Self-pay | Admitting: Oncology

## 2018-12-24 ENCOUNTER — Ambulatory Visit: Payer: Medicare Other

## 2018-12-24 ENCOUNTER — Inpatient Hospital Stay: Payer: Medicare Other | Attending: Oncology

## 2018-12-24 VITALS — BP 124/79 | HR 100 | Temp 97.3°F | Resp 17 | Wt 142.0 lb

## 2018-12-24 DIAGNOSIS — Z5111 Encounter for antineoplastic chemotherapy: Secondary | ICD-10-CM | POA: Insufficient documentation

## 2018-12-24 DIAGNOSIS — Z95828 Presence of other vascular implants and grafts: Secondary | ICD-10-CM

## 2018-12-24 DIAGNOSIS — C787 Secondary malignant neoplasm of liver and intrahepatic bile duct: Secondary | ICD-10-CM | POA: Diagnosis present

## 2018-12-24 DIAGNOSIS — C50919 Malignant neoplasm of unspecified site of unspecified female breast: Secondary | ICD-10-CM

## 2018-12-24 DIAGNOSIS — C7951 Secondary malignant neoplasm of bone: Secondary | ICD-10-CM | POA: Insufficient documentation

## 2018-12-24 DIAGNOSIS — Z170 Estrogen receptor positive status [ER+]: Secondary | ICD-10-CM | POA: Insufficient documentation

## 2018-12-24 LAB — CBC WITH DIFFERENTIAL/PLATELET
Abs Immature Granulocytes: 0.07 10*3/uL (ref 0.00–0.07)
Basophils Absolute: 0 10*3/uL (ref 0.0–0.1)
Basophils Relative: 1 %
Eosinophils Absolute: 0.1 10*3/uL (ref 0.0–0.5)
Eosinophils Relative: 3 %
HCT: 35.7 % — ABNORMAL LOW (ref 36.0–46.0)
Hemoglobin: 10.8 g/dL — ABNORMAL LOW (ref 12.0–15.0)
Immature Granulocytes: 1 %
Lymphocytes Relative: 13 %
Lymphs Abs: 0.6 10*3/uL — ABNORMAL LOW (ref 0.7–4.0)
MCH: 30.3 pg (ref 26.0–34.0)
MCHC: 30.3 g/dL (ref 30.0–36.0)
MCV: 100.3 fL — ABNORMAL HIGH (ref 80.0–100.0)
Monocytes Absolute: 0.7 10*3/uL (ref 0.1–1.0)
Monocytes Relative: 15 %
Neutro Abs: 3.3 10*3/uL (ref 1.7–7.7)
Neutrophils Relative %: 67 %
Platelets: 195 10*3/uL (ref 150–400)
RBC: 3.56 MIL/uL — ABNORMAL LOW (ref 3.87–5.11)
RDW: 18.5 % — ABNORMAL HIGH (ref 11.5–15.5)
WBC: 4.9 10*3/uL (ref 4.0–10.5)
nRBC: 0 % (ref 0.0–0.2)

## 2018-12-24 LAB — COMPREHENSIVE METABOLIC PANEL
ALT: 18 U/L (ref 0–44)
AST: 45 U/L — ABNORMAL HIGH (ref 15–41)
Albumin: 2.7 g/dL — ABNORMAL LOW (ref 3.5–5.0)
Alkaline Phosphatase: 120 U/L (ref 38–126)
Anion gap: 5 (ref 5–15)
BUN: 12 mg/dL (ref 8–23)
CO2: 23 mmol/L (ref 22–32)
Calcium: 7.6 mg/dL — ABNORMAL LOW (ref 8.9–10.3)
Chloride: 111 mmol/L (ref 98–111)
Creatinine, Ser: 0.59 mg/dL (ref 0.44–1.00)
GFR calc Af Amer: >60 mL/min (ref >60)
GFR calc non Af Amer: >60 mL/min (ref >60)
Glucose, Bld: 154 mg/dL — ABNORMAL HIGH (ref 70–99)
Potassium: 3.5 mmol/L (ref 3.5–5.1)
Sodium: 139 mmol/L (ref 135–145)
Total Bilirubin: 0.5 mg/dL (ref 0.3–1.2)
Total Protein: 5.3 g/dL — ABNORMAL LOW (ref 6.5–8.1)

## 2018-12-24 MED ORDER — SODIUM CHLORIDE 0.9% FLUSH
10.0000 mL | Freq: Once | INTRAVENOUS | Status: AC
  Administered 2018-12-24: 10 mL via INTRAVENOUS
  Filled 2018-12-24: qty 10

## 2018-12-24 MED ORDER — SODIUM CHLORIDE 0.9 % IV SOLN
Freq: Once | INTRAVENOUS | Status: AC
  Administered 2018-12-24: 15:00:00 via INTRAVENOUS
  Filled 2018-12-24: qty 250

## 2018-12-24 MED ORDER — SODIUM CHLORIDE 0.9 % IV SOLN
1400.0000 mg | Freq: Once | INTRAVENOUS | Status: AC
  Administered 2018-12-24: 1400 mg via INTRAVENOUS
  Filled 2018-12-24: qty 10.52

## 2018-12-24 MED ORDER — PROCHLORPERAZINE MALEATE 10 MG PO TABS
10.0000 mg | ORAL_TABLET | Freq: Once | ORAL | Status: AC
  Administered 2018-12-24: 10 mg via ORAL
  Filled 2018-12-24: qty 1

## 2018-12-24 MED ORDER — HEPARIN SOD (PORK) LOCK FLUSH 100 UNIT/ML IV SOLN
500.0000 [IU] | Freq: Once | INTRAVENOUS | Status: AC | PRN
  Administered 2018-12-24: 500 [IU]
  Filled 2018-12-24: qty 5

## 2018-12-24 NOTE — Progress Notes (Signed)
Patient here for a follow-up and treatment with no complaints.

## 2019-01-02 NOTE — Progress Notes (Signed)
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  INTERVAL HISTORY: Patient returns to clinic today for further evaluation and consideration of cycle 16 of single agent gemcitabine.  She is tolerating her treatments well without significant side effects. She continues to have chronic weakness and fatigue. She denies any pain. She has chronic peripheral neuropathy, but no other neurologic complaints. She denies any recent fevers or illnesses.  She denies any chest pain, shortness of breath, cough, or hemoptysis.  She denies any nausea, vomiting, constipation, or diarrhea.  She has no urinary complaints.  Patient offers no further specific complaints today.  REVIEW OF SYSTEMS:   Review of Systems  Constitutional: Positive for malaise/fatigue. Negative for fever and weight loss.  Eyes: Negative.  Negative for blurred vision, double vision and pain.  Respiratory: Negative.  Negative for cough and shortness of breath.   Cardiovascular: Negative.  Negative for chest pain and leg swelling.  Gastrointestinal: Negative.  Negative for abdominal pain.  Genitourinary: Negative.  Negative for dysuria and flank pain.  Musculoskeletal: Negative for back pain and falls.  Skin: Negative.  Negative for rash.  Neurological: Positive for tingling, sensory change and weakness. Negative for focal weakness and headaches.  Endo/Heme/Allergies: Negative.   Psychiatric/Behavioral: Negative.  Negative for memory loss. The patient is not nervous/anxious and does not have insomnia.     As per HPI. Otherwise, a complete review of systems is negative.  PAST MEDICAL HISTORY: Past  Medical History:  Diagnosis Date  . Anxiety   . Back pain    occasionally  . Breast cancer, left ([place]) 2009  . Depression    takes Paxil and Wellbutrin daily  . Diabetes ([place])    takes Metformin daily   type 2   . Genetic testing 02/03/2017   Multi-Cancer panel (83 genes) @ [facility] - No pathogenic mutations detected  . GERD (gastroesophageal reflux disease)    occasional  . History of bronchitis 2 yrs ago  . History of kidney stones   . Hyperlipidemia    takes Atorvastatin daily  . Hypertension    no meds  . Insomnia    takes Ambien nightly  . Insomnia    takes gabapentin nightly  . Joint pain   . Mood swings   . Osteoarthritis of knee   . Personal history of chemotherapy 12/05/2016   Mets from Breast Cancer  . Personal history of radiation therapy 11/2016  . Pneumonia   . PONV (postoperative nausea and vomiting)   . Seasonal allergies    takes Allegra daily    PAST SURGICAL HISTORY: Past Surgical History:  Procedure Laterality Date  . BREAST BIOPSY  2009  . cataract surgery Bilateral   . COLONOSCOPY    . IR RADIOLOGIST EVAL & MGMT  01/29/2018  . JOINT REPLACEMENT Left 2014   knee  . KNEE ARTHROSCOPY Left    x 5  . MASTECTOMY Left   . port a cath placed    . PORTACATH PLACEMENT N/A 09/17/2015   Procedure: INSERTION PORT-A-CATH;  Surgeon: Jules Husbands, MD;  Location: [facility];  Service: General;  Laterality: N/A;  . RADIOLOGY WITH ANESTHESIA N/A 02/20/2018   Procedure: CT WITH ANESTHESIA MICROWAVE THERMAL  ABLATION-LIVER;  Surgeon: Jacqulynn Cadet, MD;  Location: [facility];  Service: Anesthesiology;  Laterality: N/A;  . TOOTH EXTRACTION    . TOTAL SHOULDER ARTHROPLASTY Right 06/03/2015   Procedure: TOTAL SHOULDER ARTHROPLASTY;  Surgeon: Tania Ade, MD;  Location: [place];  Service: Orthopedics;  Laterality: Right;  Right total shoulder arthroplasty  . TOTAL SHOULDER REPLACEMENT Right 06/03/2015  . TUBAL LIGATION      FAMILY HISTORY: Father with non-Hodgkin's  lymphoma, 2 paternal aunts with breast cancer.     ADVANCED DIRECTIVES:    HEALTH MAINTENANCE: Social History   Tobacco Use  . Smoking status: Never Smoker  . Smokeless tobacco: Never Used  Substance Use Topics  . Alcohol use: Yes    Alcohol/week: 0.0 standard drinks    Comment: occasionally wine  . Drug use: Yes    Types: Marijuana    Comment: cannabis with no extra  low dose edibles  for neuropathy     Colonoscopy:  PAP:  Bone density:  Lipid panel:  Allergies  Allergen Reactions  . Ace Inhibitors Cough  . Latex Itching  . Morphine And Related Itching    Caused her to itch terribly. Would prefer if given to take with a benadryl  . Penicillins Rash    Has patient had a PCN reaction causing immediate rash, facial/tongue/throat swelling, SOB or lightheadedness with hypotension: No Has patient had a PCN reaction causing severe rash involving mucus membranes or skin necrosis: No Has patient had a PCN reaction that required hospitalization No Has patient had a PCN reaction occurring within the last 10 years: No If all of the above answers are "NO", then may proceed with Cephalosporin use.    Current Outpatient Medications  Medication Sig Dispense Refill  . acetaminophen (TYLENOL) 500 MG tablet Take 500 mg by mouth every 6 (six) hours as needed for mild pain or moderate pain.     Marland Kitchen atorvastatin (LIPITOR) 10 MG tablet Take 10 mg by mouth at bedtime.     Marland Kitchen buPROPion (WELLBUTRIN XL) 150 MG 24 hr tablet Take 1 tablet (150 mg total) by mouth at bedtime. 90 tablet 0  . Calcium-Magnesium-Vitamin D (CALCIUM 1200+D3 PO) Take 1 tablet by mouth daily.     . fexofenadine (ALLEGRA) 180 MG tablet Take 180 mg by mouth at bedtime.     . metFORMIN (GLUCOPHAGE) 850 MG tablet Take 850 mg by mouth 2 (two) times daily with a meal.     . Oxycodone HCl 10 MG TABS Take 1 tablet (10 mg total) by mouth every 4 (four) hours as needed. Ok to take every 4-6 hours as needed. 90 tablet 0  . pantoprazole  (PROTONIX) 40 MG tablet Take 40 mg by mouth at bedtime as needed.     Marland Kitchen PARoxetine (PAXIL) 40 MG tablet Take 40 mg by mouth at bedtime.     . prochlorperazine (COMPAZINE) 10 MG tablet TAKE 1 TABLET BY MOUTH EVERY 6 HOURS AS NEEDED FOR  NAUSEA OR  VOMITING 120 tablet 1  . sodium chloride (MURO 128) 5 % ophthalmic solution Place 1 drop into both eyes 4 (four) times daily.    Marland Kitchen zolpidem (AMBIEN) 10 MG tablet Take 1 tablet (10 mg total) by mouth at bedtime as needed (sleep). 90 tablet 0  . lidocaine-prilocaine (EMLA) cream Apply 1 application topically as needed. Apply to port 1-2 hours prior to chemotherapy appointment. Cover with plastic wrap. (Patient not taking: Reported on 01/07/2019) 30 g 0   No current facility-administered medications for  this visit.   Facility-Administered Medications Ordered in Other Visits  Medication Dose Route Frequency Provider Last Rate Last Admin  . gemcitabine (GEMZAR) 1,400 mg in sodium chloride 0.9 % 250 mL chemo infusion  1,400 mg Intravenous Once Lloyd Huger, MD 574 mL/hr at 01/07/19 1209 1,400 mg at 01/07/19 1209  . heparin lock flush 100 unit/mL  500 Units Intracatheter Once PRN Lloyd Huger, MD      . sodium chloride flush (NS) 0.9 % injection 10 mL  10 mL Intracatheter PRN Lloyd Huger, MD   10 mL at 01/07/19 1209    OBJECTIVE: Vitals:   01/07/19 1138  BP: 130/85  Pulse: 92  Resp: 16  Temp: (!) 97.2 F (36.2 C)  SpO2: 99%     Body mass index is 25.28 kg/m.    ECOG FS:1 - Symptomatic but completely ambulatory  General: Well-developed, well-nourished, no acute distress.  Sitting in a wheelchair. Eyes: Pink conjunctiva, anicteric sclera. HEENT: Normocephalic, moist mucous membranes. Lungs: No audible wheezing or coughing. Heart: Regular rate and rhythm. Abdomen: Soft, nontender, no obvious distention. Musculoskeletal: No edema, cyanosis, or clubbing. Neuro: Alert, answering all questions appropriately. Cranial nerves grossly  intact. Skin: No rashes or petechiae noted. Psych: Normal affect.  LAB RESULTS:  Lab Results  Component Value Date   NA 138 01/07/2019   K 4.2 01/07/2019   CL 107 01/07/2019   CO2 23 01/07/2019   GLUCOSE 150 (H) 01/07/2019   BUN 14 01/07/2019   CREATININE 0.64 01/07/2019   CALCIUM 8.2 (L) 01/07/2019   PROT 5.4 (L) 01/07/2019   ALBUMIN 2.9 (L) 01/07/2019   AST 57 (H) 01/07/2019   ALT 21 01/07/2019   ALKPHOS 147 (H) 01/07/2019   BILITOT 0.5 01/07/2019   GFRNONAA >60 01/07/2019   GFRAA >60 01/07/2019    Lab Results  Component Value Date   WBC 4.1 01/07/2019   NEUTROABS 2.6 01/07/2019   HGB 10.3 (L) 01/07/2019   HCT 33.6 (L) 01/07/2019   MCV 101.5 (H) 01/07/2019   PLT 189 01/07/2019     STUDIES: No results found.  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  PLAN:    1.  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  The majority of her known malignancy is confined to her liver.  There are no other obvious sites of disease. MRI of the brain on Jun 03, 2018 did not reveal metastatic disease.  Although hospice was previously discussed as well as the possibility of gemcitabine being her only therapeutic option, given her continued improving performance status, this may no longer be the case.  Given her history of significant pancytopenia, she is only able to tolerate gemcitabine every other week.  Proceed with cycle 16 of gemcitabine today.  Return to clinic in 2 weeks for further evaluation and consideration of cycle 17.  Plan to reimage at the end of January 2021.   2.  Anemia: Chronic and unchanged.  Patient's hemoglobin has trended down slightly to 10.3. 3.  Peripheral neuropathy: Chronic and unchanged.   4.  Back pain: MRI results from May 04, 2017 did not reveal metastatic disease.   Continue symptomatic treatment.  5.  Left IJ clot: Ultrasound results reviewed independently.  Patient has been instructed to discontinue Eliquis. 6.  Neutropenia: Resolved. 7.  Memory complaints/confusion: Patient does not complain of  this today.  Repeat MRI of the brain on Jun 03, 2018 reviewed independently with no obvious intracranial abnormality.  Patient continues to have progressive and persistent osseous metastatic disease. 8.  Elevated liver enzymes: Chronic and unchanged.  AST remains mildly elevated at 57. 9.  Insomnia: Patient does not complain of this today.  Continue Ambien as prescribed.   10.  Weakness and fatigue: Patient was previously given a referral to the CARE program.  Patient expressed understanding and was in agreement with this plan. She also understands that She can call clinic at any time with any questions, concerns, or complaints.   Breast cancer   Staging form: Breast, AJCC 7th Edition     Pathologic stage from 08/11/2014: Stage IIIA (T0, N2a, cM0) - Signed by Lloyd Huger, MD on 08/11/2014   Lloyd Huger, MD   01/07/2019 12:36 PM

## 2019-01-07 ENCOUNTER — Inpatient Hospital Stay: Payer: Medicare Other

## 2019-01-07 ENCOUNTER — Inpatient Hospital Stay (HOSPITAL_BASED_OUTPATIENT_CLINIC_OR_DEPARTMENT_OTHER): Payer: Medicare Other | Admitting: Oncology

## 2019-01-07 ENCOUNTER — Encounter: Payer: Self-pay | Admitting: Oncology

## 2019-01-07 ENCOUNTER — Other Ambulatory Visit: Payer: Self-pay

## 2019-01-07 VITALS — BP 130/85 | HR 92 | Temp 97.2°F | Resp 16 | Wt 147.3 lb

## 2019-01-07 DIAGNOSIS — Z95828 Presence of other vascular implants and grafts: Secondary | ICD-10-CM

## 2019-01-07 DIAGNOSIS — Z5111 Encounter for antineoplastic chemotherapy: Secondary | ICD-10-CM | POA: Diagnosis not present

## 2019-01-07 DIAGNOSIS — C50919 Malignant neoplasm of unspecified site of unspecified female breast: Secondary | ICD-10-CM

## 2019-01-07 LAB — COMPREHENSIVE METABOLIC PANEL
ALT: 21 U/L (ref 0–44)
AST: 57 U/L — ABNORMAL HIGH (ref 15–41)
Albumin: 2.9 g/dL — ABNORMAL LOW (ref 3.5–5.0)
Alkaline Phosphatase: 147 U/L — ABNORMAL HIGH (ref 38–126)
Anion gap: 8 (ref 5–15)
BUN: 14 mg/dL (ref 8–23)
CO2: 23 mmol/L (ref 22–32)
Calcium: 8.2 mg/dL — ABNORMAL LOW (ref 8.9–10.3)
Chloride: 107 mmol/L (ref 98–111)
Creatinine, Ser: 0.64 mg/dL (ref 0.44–1.00)
GFR calc Af Amer: >60 mL/min (ref >60)
GFR calc non Af Amer: >60 mL/min (ref >60)
Glucose, Bld: 150 mg/dL — ABNORMAL HIGH (ref 70–99)
Potassium: 4.2 mmol/L (ref 3.5–5.1)
Sodium: 138 mmol/L (ref 135–145)
Total Bilirubin: 0.5 mg/dL (ref 0.3–1.2)
Total Protein: 5.4 g/dL — ABNORMAL LOW (ref 6.5–8.1)

## 2019-01-07 LAB — CBC WITH DIFFERENTIAL/PLATELET
Abs Immature Granulocytes: 0.08 10*3/uL — ABNORMAL HIGH (ref 0.00–0.07)
Basophils Absolute: 0 10*3/uL (ref 0.0–0.1)
Basophils Relative: 1 %
Eosinophils Absolute: 0.2 10*3/uL (ref 0.0–0.5)
Eosinophils Relative: 4 %
HCT: 33.6 % — ABNORMAL LOW (ref 36.0–46.0)
Hemoglobin: 10.3 g/dL — ABNORMAL LOW (ref 12.0–15.0)
Immature Granulocytes: 2 %
Lymphocytes Relative: 12 %
Lymphs Abs: 0.5 10*3/uL — ABNORMAL LOW (ref 0.7–4.0)
MCH: 31.1 pg (ref 26.0–34.0)
MCHC: 30.7 g/dL (ref 30.0–36.0)
MCV: 101.5 fL — ABNORMAL HIGH (ref 80.0–100.0)
Monocytes Absolute: 0.8 10*3/uL (ref 0.1–1.0)
Monocytes Relative: 19 %
Neutro Abs: 2.6 10*3/uL (ref 1.7–7.7)
Neutrophils Relative %: 62 %
Platelets: 189 10*3/uL (ref 150–400)
RBC: 3.31 MIL/uL — ABNORMAL LOW (ref 3.87–5.11)
RDW: 18.2 % — ABNORMAL HIGH (ref 11.5–15.5)
WBC: 4.1 10*3/uL (ref 4.0–10.5)
nRBC: 0 % (ref 0.0–0.2)

## 2019-01-07 MED ORDER — HEPARIN SOD (PORK) LOCK FLUSH 100 UNIT/ML IV SOLN
INTRAVENOUS | Status: AC
  Filled 2019-01-07: qty 5

## 2019-01-07 MED ORDER — HEPARIN SOD (PORK) LOCK FLUSH 100 UNIT/ML IV SOLN
500.0000 [IU] | Freq: Once | INTRAVENOUS | Status: AC | PRN
  Administered 2019-01-07: 500 [IU]
  Filled 2019-01-07: qty 5

## 2019-01-07 MED ORDER — PROCHLORPERAZINE MALEATE 10 MG PO TABS
10.0000 mg | ORAL_TABLET | Freq: Once | ORAL | Status: AC
  Administered 2019-01-07: 12:00:00 10 mg via ORAL
  Filled 2019-01-07: qty 1

## 2019-01-07 MED ORDER — SODIUM CHLORIDE 0.9 % IV SOLN
Freq: Once | INTRAVENOUS | Status: AC
  Filled 2019-01-07: qty 250

## 2019-01-07 MED ORDER — SODIUM CHLORIDE 0.9 % IV SOLN
1400.0000 mg | Freq: Once | INTRAVENOUS | Status: AC
  Administered 2019-01-07: 12:00:00 1400 mg via INTRAVENOUS
  Filled 2019-01-07: qty 26.3

## 2019-01-07 MED ORDER — SODIUM CHLORIDE 0.9% FLUSH
10.0000 mL | INTRAVENOUS | Status: DC | PRN
  Administered 2019-01-07: 10 mL
  Filled 2019-01-07: qty 10

## 2019-01-07 MED ORDER — SODIUM CHLORIDE 0.9% FLUSH
10.0000 mL | Freq: Once | INTRAVENOUS | Status: AC
  Administered 2019-01-07: 11:00:00 10 mL via INTRAVENOUS
  Filled 2019-01-07: qty 10

## 2019-01-15 NOTE — Progress Notes (Signed)
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  INTERVAL HISTORY: Patient returns to clinic today for further evaluation and consideration of cycle 17 of single agent gemcitabine.  She has noticed increased nausea this past week, but otherwise is tolerating her treatments well.  She continues to have chronic weakness and fatigue. She denies any pain. She has chronic peripheral neuropathy, but no other neurologic complaints. She denies any recent fevers or illnesses.  She denies any chest pain, shortness of breath, cough, or hemoptysis.  She denies any vomiting, constipation, or diarrhea.  She has no urinary complaints.  Patient offers no further specific complaints today.  REVIEW OF SYSTEMS:   Review of Systems  Constitutional: Positive for malaise/fatigue. Negative for fever and weight loss.  Eyes: Negative.  Negative for blurred vision, double vision and pain.  Respiratory: Negative.  Negative for cough and shortness of breath.   Cardiovascular: Negative.  Negative for chest pain and leg swelling.  Gastrointestinal: Positive for nausea. Negative for abdominal pain.  Genitourinary: Negative.  Negative for dysuria and flank pain.  Musculoskeletal: Negative for back pain and falls.  Skin: Negative.  Negative for rash.  Neurological: Positive for tingling, sensory change and weakness. Negative for focal weakness and headaches.  Endo/Heme/Allergies: Negative.   Psychiatric/Behavioral: Negative.  Negative for memory loss. The patient is not nervous/anxious and does not have insomnia.     As per HPI. Otherwise, a complete review of systems is  negative.  PAST MEDICAL HISTORY: Past Medical History:  Diagnosis Date  . Anxiety   . Back pain    occasionally  . Breast cancer, left ([place]) 2009  . Depression    takes Paxil and Wellbutrin daily  . Diabetes ([place])    takes Metformin daily   type 2   . Genetic testing 02/03/2017   Multi-Cancer panel (83 genes) @ [facility] - No pathogenic mutations detected  . GERD (gastroesophageal reflux disease)    occasional  . History of bronchitis 2 yrs ago  . History of kidney stones   . Hyperlipidemia    takes Atorvastatin daily  . Hypertension    no meds  . Insomnia    takes Ambien nightly  . Insomnia    takes gabapentin nightly  . Joint pain   . Mood swings   . Osteoarthritis of knee   . Personal history of chemotherapy 12/05/2016   Mets from Breast Cancer  . Personal history of radiation therapy 11/2016  . Pneumonia   . PONV (postoperative nausea and vomiting)   . Seasonal allergies    takes Allegra daily    PAST SURGICAL HISTORY: Past Surgical History:  Procedure Laterality Date  . BREAST BIOPSY  2009  . cataract surgery Bilateral   . COLONOSCOPY    . IR RADIOLOGIST EVAL & MGMT  01/29/2018  . JOINT REPLACEMENT Left 2014   knee  . KNEE ARTHROSCOPY Left    x 5  . MASTECTOMY Left   . port a cath placed    . PORTACATH PLACEMENT N/A 09/17/2015   Procedure: INSERTION PORT-A-CATH;  Surgeon: Jules Husbands, MD;  Location: [facility];  Service: General;  Laterality: N/A;  . RADIOLOGY WITH ANESTHESIA N/A 02/20/2018  Procedure: CT WITH ANESTHESIA MICROWAVE THERMAL ABLATION-LIVER;  Surgeon: Jacqulynn Cadet, MD;  Location: [facility];  Service: Anesthesiology;  Laterality: N/A;  . TOOTH EXTRACTION    . TOTAL SHOULDER ARTHROPLASTY Right 06/03/2015   Procedure: TOTAL SHOULDER ARTHROPLASTY;  Surgeon: Tania Ade, MD;  Location: [facility];  Service: Orthopedics;  Laterality: Right;  Right total shoulder arthroplasty  . TOTAL SHOULDER REPLACEMENT Right 06/03/2015  . TUBAL LIGATION       FAMILY HISTORY: Father with non-Hodgkin's lymphoma, 2 paternal aunts with breast cancer.     ADVANCED DIRECTIVES:    HEALTH MAINTENANCE: Social History   Tobacco Use  . Smoking status: Never Smoker  . Smokeless tobacco: Never Used  Substance Use Topics  . Alcohol use: Yes    Alcohol/week: 0.0 standard drinks    Comment: occasionally wine  . Drug use: Yes    Types: Marijuana    Comment: cannabis with no extra  low dose edibles  for neuropathy     Colonoscopy:  PAP:  Bone density:  Lipid panel:  Allergies  Allergen Reactions  . Ace Inhibitors Cough  . Latex Itching  . Morphine And Related Itching    Caused her to itch terribly. Would prefer if given to take with a benadryl  . Penicillins Rash    Has patient had a PCN reaction causing immediate rash, facial/tongue/throat swelling, SOB or lightheadedness with hypotension: No Has patient had a PCN reaction causing severe rash involving mucus membranes or skin necrosis: No Has patient had a PCN reaction that required hospitalization No Has patient had a PCN reaction occurring within the last 10 years: No If all of the above answers are "NO", then may proceed with Cephalosporin use.    Current Outpatient Medications  Medication Sig Dispense Refill  . acetaminophen (TYLENOL) 500 MG tablet Take 500 mg by mouth every 6 (six) hours as needed for mild pain or moderate pain.     Marland Kitchen atorvastatin (LIPITOR) 10 MG tablet Take 10 mg by mouth at bedtime.     Marland Kitchen buPROPion (WELLBUTRIN XL) 150 MG 24 hr tablet Take 1 tablet (150 mg total) by mouth at bedtime. 90 tablet 0  . Calcium-Magnesium-Vitamin D (CALCIUM 1200+D3 PO) Take 1 tablet by mouth daily.     . fexofenadine (ALLEGRA) 180 MG tablet Take 180 mg by mouth at bedtime.     . lidocaine-prilocaine (EMLA) cream Apply 1 application topically as needed. Apply to port 1-2 hours prior to chemotherapy appointment. Cover with plastic wrap. 30 g 0  . metFORMIN (GLUCOPHAGE) 850 MG tablet  Take 850 mg by mouth 2 (two) times daily with a meal.     . Oxycodone HCl 10 MG TABS Take 1 tablet (10 mg total) by mouth every 4 (four) hours as needed. Ok to take every 4-6 hours as needed. 90 tablet 0  . pantoprazole (PROTONIX) 40 MG tablet Take 40 mg by mouth at bedtime as needed.     Marland Kitchen PARoxetine (PAXIL) 40 MG tablet Take 40 mg by mouth at bedtime.     . prochlorperazine (COMPAZINE) 10 MG tablet TAKE 1 TABLET BY MOUTH EVERY 6 HOURS AS NEEDED FOR  NAUSEA OR  VOMITING 120 tablet 1  . sodium chloride (MURO 128) 5 % ophthalmic solution Place 1 drop into both eyes 4 (four) times daily.    Marland Kitchen zolpidem (AMBIEN) 10 MG tablet Take 1 tablet (10 mg total) by mouth at bedtime as needed (sleep). 90 tablet 0  . ergocalciferol (VITAMIN D2) 1.25 MG (  50000 UT) capsule Vitamin D2 1,250 mcg (50,000 unit) capsule  TAKE 1 CAPSULE (50,000 UNITS TOTAL) BY MOUTH EVERY 7 (SEVEN) DAYS.     No current facility-administered medications for this visit.   Facility-Administered Medications Ordered in Other Visits  Medication Dose Route Frequency Provider Last Rate Last Admin  . gemcitabine (GEMZAR) 1,400 mg in sodium chloride 0.9 % 250 mL chemo infusion  1,400 mg Intravenous Once Lloyd Huger, MD      . heparin lock flush 100 unit/mL  500 Units Intracatheter Once PRN Lloyd Huger, MD        OBJECTIVE: Vitals:   01/21/19 1058  BP: 118/76  Pulse: 88  Temp: (!) 96.8 F (36 C)     Body mass index is 25.58 kg/m.    ECOG FS:1 - Symptomatic but completely ambulatory  General: Thin, no acute distress.  Sitting in a wheelchair. Eyes: Pink conjunctiva, anicteric sclera. HEENT: Normocephalic, moist mucous membranes. Lungs: No audible wheezing or coughing. Heart: Regular rate and rhythm. Abdomen: Soft, nontender, no obvious distention. Musculoskeletal: No edema, cyanosis, or clubbing. Neuro: Alert, answering all questions appropriately. Cranial nerves grossly intact. Skin: No rashes or petechiae  noted. Psych: Normal affect.  LAB RESULTS:  Lab Results  Component Value Date   NA 137 01/21/2019   K 4.8 01/21/2019   CL 106 01/21/2019   CO2 24 01/21/2019   GLUCOSE 168 (H) 01/21/2019   BUN 17 01/21/2019   CREATININE 0.82 01/21/2019   CALCIUM 8.6 (L) 01/21/2019   PROT 5.5 (L) 01/21/2019   ALBUMIN 2.9 (L) 01/21/2019   AST 58 (H) 01/21/2019   ALT 22 01/21/2019   ALKPHOS 184 (H) 01/21/2019   BILITOT 0.5 01/21/2019   GFRNONAA >60 01/21/2019   GFRAA >60 01/21/2019    Lab Results  Component Value Date   WBC 5.2 01/21/2019   NEUTROABS 3.3 01/21/2019   HGB 10.7 (L) 01/21/2019   HCT 35.3 (L) 01/21/2019   MCV 102.6 (H) 01/21/2019   PLT 205 01/21/2019     STUDIES: No results found.  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  PLAN:    1.  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  The majority of her known malignancy is confined to her liver.  There are no other obvious sites of disease. MRI of the brain on Jun 03, 2018 did not reveal metastatic disease.  Although hospice was previously discussed as well as the possibility of gemcitabine being her only therapeutic option, given her continued improving performance status, this may no longer be the case.  Given her history of significant pancytopenia, she is only able to tolerate gemcitabine every other week.  Proceed with cycle 17 of gemcitabine today.  Return to clinic in 2 weeks for further evaluation and consideration of cycle 18.  Will reimage with CT scans 1 to 2 days prior to her next treatment.   2.  Anemia: Chronic and unchanged.  Patient's hemoglobin is 10.7 today. 3.  Peripheral neuropathy: Chronic and unchanged.   4.  Back pain: MRI results from May 04, 2017 did not reveal metastatic disease.  Continue symptomatic treatment.   5.  Left IJ clot: Ultrasound results reviewed independently.  Patient has been instructed to discontinue Eliquis. 6.  Neutropenia: Resolved. 7.  Memory complaints/confusion: Patient does not complain of this today.  Repeat MRI of  the brain on Jun 03, 2018 reviewed independently with no obvious intracranial abnormality.  Patient continues to have progressive and persistent osseous metastatic disease. 8.  Elevated liver enzymes: Chronic and unchanged.  AST remains mildly elevated at 58. 9.  Insomnia: Patient does not complain of this today.  Continue Ambien as prescribed.   10.  Weakness and fatigue: Chronic and unchanged.  Patient was previously given a referral to the [facility] program.  Patient expressed understanding and was in agreement with this plan. She also understands that She can call clinic at any time with any questions, concerns, or complaints.   Breast cancer   Staging form: Breast, AJCC 7th Edition     Pathologic stage from 08/11/2014: Stage IIIA (T0, N2a, cM0) - Signed by Lloyd Huger, MD on 08/11/2014   Lloyd Huger, MD   01/21/2019 11:49 AM

## 2019-01-20 ENCOUNTER — Other Ambulatory Visit: Payer: Self-pay

## 2019-01-20 NOTE — Progress Notes (Signed)
Patient pre screened for office appointment, no questions or concerns today. Patient reminded of upcoming appointment time and date. 

## 2019-01-21 ENCOUNTER — Inpatient Hospital Stay: Payer: Medicare Other

## 2019-01-21 ENCOUNTER — Inpatient Hospital Stay: Payer: Medicare Other | Attending: Oncology

## 2019-01-21 ENCOUNTER — Inpatient Hospital Stay (HOSPITAL_BASED_OUTPATIENT_CLINIC_OR_DEPARTMENT_OTHER): Payer: Medicare Other | Admitting: Oncology

## 2019-01-21 ENCOUNTER — Other Ambulatory Visit: Payer: Self-pay

## 2019-01-21 VITALS — BP 118/76 | HR 88 | Temp 96.8°F | Wt 149.0 lb

## 2019-01-21 DIAGNOSIS — C7951 Secondary malignant neoplasm of bone: Secondary | ICD-10-CM | POA: Insufficient documentation

## 2019-01-21 DIAGNOSIS — C787 Secondary malignant neoplasm of liver and intrahepatic bile duct: Secondary | ICD-10-CM | POA: Diagnosis not present

## 2019-01-21 DIAGNOSIS — C50912 Malignant neoplasm of unspecified site of left female breast: Secondary | ICD-10-CM | POA: Diagnosis not present

## 2019-01-21 DIAGNOSIS — C50919 Malignant neoplasm of unspecified site of unspecified female breast: Secondary | ICD-10-CM

## 2019-01-21 DIAGNOSIS — Z170 Estrogen receptor positive status [ER+]: Secondary | ICD-10-CM | POA: Diagnosis not present

## 2019-01-21 DIAGNOSIS — Z5111 Encounter for antineoplastic chemotherapy: Secondary | ICD-10-CM | POA: Diagnosis not present

## 2019-01-21 DIAGNOSIS — Z95828 Presence of other vascular implants and grafts: Secondary | ICD-10-CM

## 2019-01-21 LAB — COMPREHENSIVE METABOLIC PANEL
ALT: 22 U/L (ref 0–44)
AST: 58 U/L — ABNORMAL HIGH (ref 15–41)
Albumin: 2.9 g/dL — ABNORMAL LOW (ref 3.5–5.0)
Alkaline Phosphatase: 184 U/L — ABNORMAL HIGH (ref 38–126)
Anion gap: 7 (ref 5–15)
BUN: 17 mg/dL (ref 8–23)
CO2: 24 mmol/L (ref 22–32)
Calcium: 8.6 mg/dL — ABNORMAL LOW (ref 8.9–10.3)
Chloride: 106 mmol/L (ref 98–111)
Creatinine, Ser: 0.82 mg/dL (ref 0.44–1.00)
GFR calc Af Amer: >60 mL/min (ref >60)
GFR calc non Af Amer: >60 mL/min (ref >60)
Glucose, Bld: 168 mg/dL — ABNORMAL HIGH (ref 70–99)
Potassium: 4.8 mmol/L (ref 3.5–5.1)
Sodium: 137 mmol/L (ref 135–145)
Total Bilirubin: 0.5 mg/dL (ref 0.3–1.2)
Total Protein: 5.5 g/dL — ABNORMAL LOW (ref 6.5–8.1)

## 2019-01-21 LAB — CBC WITH DIFFERENTIAL/PLATELET
Abs Immature Granulocytes: 0.11 10*3/uL — ABNORMAL HIGH (ref 0.00–0.07)
Basophils Absolute: 0 10*3/uL (ref 0.0–0.1)
Basophils Relative: 1 %
Eosinophils Absolute: 0.1 10*3/uL (ref 0.0–0.5)
Eosinophils Relative: 3 %
HCT: 35.3 % — ABNORMAL LOW (ref 36.0–46.0)
Hemoglobin: 10.7 g/dL — ABNORMAL LOW (ref 12.0–15.0)
Immature Granulocytes: 2 %
Lymphocytes Relative: 11 %
Lymphs Abs: 0.6 10*3/uL — ABNORMAL LOW (ref 0.7–4.0)
MCH: 31.1 pg (ref 26.0–34.0)
MCHC: 30.3 g/dL (ref 30.0–36.0)
MCV: 102.6 fL — ABNORMAL HIGH (ref 80.0–100.0)
Monocytes Absolute: 1 10*3/uL (ref 0.1–1.0)
Monocytes Relative: 20 %
Neutro Abs: 3.3 10*3/uL (ref 1.7–7.7)
Neutrophils Relative %: 63 %
Platelets: 205 10*3/uL (ref 150–400)
RBC: 3.44 MIL/uL — ABNORMAL LOW (ref 3.87–5.11)
RDW: 17.5 % — ABNORMAL HIGH (ref 11.5–15.5)
WBC: 5.2 10*3/uL (ref 4.0–10.5)
nRBC: 0 % (ref 0.0–0.2)

## 2019-01-21 MED ORDER — SODIUM CHLORIDE 0.9 % IV SOLN
Freq: Once | INTRAVENOUS | Status: AC
  Filled 2019-01-21: qty 250

## 2019-01-21 MED ORDER — PROCHLORPERAZINE MALEATE 10 MG PO TABS
10.0000 mg | ORAL_TABLET | Freq: Once | ORAL | Status: AC
  Administered 2019-01-21: 10 mg via ORAL
  Filled 2019-01-21: qty 1

## 2019-01-21 MED ORDER — SODIUM CHLORIDE 0.9 % IV SOLN
1400.0000 mg | Freq: Once | INTRAVENOUS | Status: AC
  Administered 2019-01-21: 1400 mg via INTRAVENOUS
  Filled 2019-01-21: qty 10.52

## 2019-01-21 MED ORDER — SODIUM CHLORIDE 0.9% FLUSH
10.0000 mL | Freq: Once | INTRAVENOUS | Status: AC
  Administered 2019-01-21: 11:00:00 10 mL via INTRAVENOUS
  Filled 2019-01-21: qty 10

## 2019-01-21 MED ORDER — HEPARIN SOD (PORK) LOCK FLUSH 100 UNIT/ML IV SOLN
500.0000 [IU] | Freq: Once | INTRAVENOUS | Status: AC | PRN
  Administered 2019-01-21: 500 [IU]
  Filled 2019-01-21: qty 5

## 2019-01-31 ENCOUNTER — Ambulatory Visit
Admission: RE | Admit: 2019-01-31 | Discharge: 2019-01-31 | Disposition: A | Discharge status: Home / Self Care | Payer: Medicare Other | Source: Ambulatory Visit | Attending: Oncology | Admitting: Oncology

## 2019-01-31 ENCOUNTER — Other Ambulatory Visit: Payer: Self-pay

## 2019-01-31 DIAGNOSIS — C50912 Malignant neoplasm of unspecified site of left female breast: Secondary | ICD-10-CM | POA: Insufficient documentation

## 2019-01-31 DIAGNOSIS — C7951 Secondary malignant neoplasm of bone: Secondary | ICD-10-CM | POA: Diagnosis not present

## 2019-01-31 DIAGNOSIS — Z170 Estrogen receptor positive status [ER+]: Secondary | ICD-10-CM | POA: Insufficient documentation

## 2019-01-31 DIAGNOSIS — C50919 Malignant neoplasm of unspecified site of unspecified female breast: Secondary | ICD-10-CM | POA: Diagnosis not present

## 2019-01-31 MED ORDER — IOHEXOL 300 MG/ML  SOLN
75.0000 mL | Freq: Once | INTRAMUSCULAR | Status: AC | PRN
  Administered 2019-01-31: 75 mL via INTRAVENOUS

## 2019-01-31 NOTE — Progress Notes (Signed)
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  INTERVAL HISTORY: Patient returns to clinic today for further evaluation, discussion of her imaging results, and consideration of cycle 18 of single agent gemcitabine.  She continues to have chronic weakness and fatigue, but otherwise feels well.  She denies any pain. She has chronic peripheral neuropathy, but no other neurologic complaints. She denies any recent fevers or illnesses.  She denies any chest pain, shortness of breath, cough, or hemoptysis.  She denies any vomiting, constipation, or diarrhea.  She has no urinary complaints.  Patient offers no further specific complaints today.  REVIEW OF SYSTEMS:   Review of Systems  Constitutional: Positive for malaise/fatigue. Negative for fever and weight loss.  Eyes: Negative.  Negative for blurred vision, double vision and pain.  Respiratory: Negative.  Negative for cough and shortness of breath.   Cardiovascular: Negative.  Negative for chest pain and leg swelling.  Gastrointestinal: Negative.  Negative for abdominal pain and nausea.  Genitourinary: Negative.  Negative for dysuria and flank pain.  Musculoskeletal: Negative for back pain and falls.  Skin: Negative.  Negative for rash.  Neurological: Positive for tingling, sensory change and weakness. Negative for focal weakness and headaches.  Endo/Heme/Allergies: Negative.   Psychiatric/Behavioral: Negative.  Negative for memory loss. The patient is not nervous/anxious and does not have insomnia.     As per HPI. Otherwise, a complete review of systems is negative.  PAST MEDICAL HISTORY: Past Medical  History:  Diagnosis Date  . Anxiety   . Back pain    occasionally  . Breast cancer, left ([place]) 2009  . Depression    takes Paxil and Wellbutrin daily  . Genetic testing 02/03/2017   Multi-Cancer panel (83 genes) @ [facility] - No pathogenic mutations detected  . GERD (gastroesophageal reflux disease)    occasional  . History of bronchitis 2 yrs ago  . History of kidney stones   . Hyperlipidemia    takes Atorvastatin daily  . Insomnia    takes Ambien nightly  . Insomnia    takes gabapentin nightly  . Joint pain   . Mood swings   . Osteoarthritis of knee   . Personal history of chemotherapy 12/05/2016   Mets from Breast Cancer  . Personal history of radiation therapy 11/2016  . Pneumonia   . PONV (postoperative nausea and vomiting)   . Seasonal allergies    takes Allegra daily    PAST SURGICAL HISTORY: Past Surgical History:  Procedure Laterality Date  . BREAST BIOPSY  2009  . cataract surgery Bilateral   . COLONOSCOPY    . IR RADIOLOGIST EVAL & MGMT  01/29/2018  . JOINT REPLACEMENT Left 2014   knee  . KNEE ARTHROSCOPY Left    x 5  . MASTECTOMY Left   . port a cath placed    . PORTACATH PLACEMENT N/A 09/17/2015   Procedure: INSERTION PORT-A-CATH;  Surgeon: Jules Husbands, MD;  Location: [facility];  Service: General;  Laterality: N/A;  . RADIOLOGY WITH ANESTHESIA N/A 02/20/2018   Procedure: CT WITH ANESTHESIA MICROWAVE THERMAL ABLATION-LIVER;  Surgeon: Jacqulynn Cadet, MD;  Location: [facility];  Service: Anesthesiology;  Laterality: N/A;  . TOOTH EXTRACTION    .  TOTAL SHOULDER ARTHROPLASTY Right 06/03/2015   Procedure: TOTAL SHOULDER ARTHROPLASTY;  Surgeon: Tania Ade, MD;  Location: [facility];  Service: Orthopedics;  Laterality: Right;  Right total shoulder arthroplasty  . TOTAL SHOULDER REPLACEMENT Right 06/03/2015  . TUBAL LIGATION      FAMILY HISTORY: Father with non-Hodgkin's lymphoma, 2 paternal aunts with breast cancer.     ADVANCED DIRECTIVES:    HEALTH  MAINTENANCE: Social History   Tobacco Use  . Smoking status: Never Smoker  . Smokeless tobacco: Never Used  Substance Use Topics  . Alcohol use: Yes    Alcohol/week: 0.0 standard drinks    Comment: occasionally wine  . Drug use: Yes    Types: Marijuana    Comment: cannabis with no extra  low dose edibles  for neuropathy     Colonoscopy:  PAP:  Bone density:  Lipid panel:  Allergies  Allergen Reactions  . Ace Inhibitors Cough  . Latex Itching  . Morphine And Related Itching    Caused her to itch terribly. Would prefer if given to take with a benadryl  . Penicillins Rash    Has patient had a PCN reaction causing immediate rash, facial/tongue/throat swelling, SOB or lightheadedness with hypotension: No Has patient had a PCN reaction causing severe rash involving mucus membranes or skin necrosis: No Has patient had a PCN reaction that required hospitalization No Has patient had a PCN reaction occurring within the last 10 years: No If all of the above answers are "NO", then may proceed with Cephalosporin use.    Current Outpatient Medications  Medication Sig Dispense Refill  . acetaminophen (TYLENOL) 500 MG tablet Take 500 mg by mouth every 6 (six) hours as needed for mild pain or moderate pain.     Marland Kitchen atorvastatin (LIPITOR) 10 MG tablet Take 10 mg by mouth at bedtime.     Marland Kitchen buPROPion (WELLBUTRIN XL) 150 MG 24 hr tablet Take 1 tablet (150 mg total) by mouth at bedtime. 90 tablet 0  . Calcium-Magnesium-Vitamin D (CALCIUM 1200+D3 PO) Take 1 tablet by mouth daily.     . ergocalciferol (VITAMIN D2) 1.25 MG (50000 UT) capsule Vitamin D2 1,250 mcg (50,000 unit) capsule  TAKE 1 CAPSULE (50,000 UNITS TOTAL) BY MOUTH EVERY 7 (SEVEN) DAYS.    . fexofenadine (ALLEGRA) 180 MG tablet Take 180 mg by mouth at bedtime.     . lidocaine-prilocaine (EMLA) cream Apply 1 application topically as needed. Apply to port 1-2 hours prior to chemotherapy appointment. Cover with plastic wrap. 30 g 0  .  metFORMIN (GLUCOPHAGE) 850 MG tablet Take 850 mg by mouth 2 (two) times daily with a meal.     . Oxycodone HCl 10 MG TABS Take 1 tablet (10 mg total) by mouth every 4 (four) hours as needed. Ok to take every 4-6 hours as needed. 90 tablet 0  . pantoprazole (PROTONIX) 40 MG tablet Take 40 mg by mouth at bedtime as needed.     Marland Kitchen PARoxetine (PAXIL) 40 MG tablet Take 40 mg by mouth at bedtime.     . prochlorperazine (COMPAZINE) 10 MG tablet TAKE 1 TABLET BY MOUTH EVERY 6 HOURS AS NEEDED FOR  NAUSEA OR  VOMITING 120 tablet 1  . sodium chloride (MURO 128) 5 % ophthalmic solution Place 1 drop into both eyes 4 (four) times daily.    Marland Kitchen zolpidem (AMBIEN) 10 MG tablet Take 1 tablet (10 mg total) by mouth at bedtime as needed (sleep). 90 tablet 0   No current facility-administered  medications for this visit.    OBJECTIVE: Vitals:   02/04/19 1030  BP: 106/78  Pulse: (!) 108  Resp: 18  Temp: (!) 96.8 F (36 C)     There is no height or weight on file to calculate BMI.    ECOG FS:1 - Symptomatic but completely ambulatory  General: Thin, no acute distress.  Sitting in a wheelchair. Eyes: Pink conjunctiva, anicteric sclera. HEENT: Normocephalic, moist mucous membranes. Lungs: No audible wheezing or coughing. Heart: Regular rate and rhythm. Abdomen: Soft, nontender, no obvious distention. Musculoskeletal: No edema, cyanosis, or clubbing. Neuro: Alert, answering all questions appropriately. Cranial nerves grossly intact. Skin: No rashes or petechiae noted. Psych: Normal affect.   LAB RESULTS:  Lab Results  Component Value Date   NA 139 02/04/2019   K 3.9 02/04/2019   CL 107 02/04/2019   CO2 23 02/04/2019   GLUCOSE 165 (H) 02/04/2019   BUN 16 02/04/2019   CREATININE 0.64 02/04/2019   CALCIUM 8.5 (L) 02/04/2019   PROT 5.6 (L) 02/04/2019   ALBUMIN 2.8 (L) 02/04/2019   AST 66 (H) 02/04/2019   ALT 21 02/04/2019   ALKPHOS 153 (H) 02/04/2019   BILITOT 0.6 02/04/2019   GFRNONAA >60 02/04/2019    GFRAA >60 02/04/2019    Lab Results  Component Value Date   WBC 5.5 02/04/2019   NEUTROABS 3.6 02/04/2019   HGB 11.1 (L) 02/04/2019   HCT 36.4 02/04/2019   MCV 103.4 (H) 02/04/2019   PLT 183 02/04/2019     STUDIES: CT Chest W Contrast  Result Date: 01/31/2019 CLINICAL DATA:  Restaging of metastatic left breast cancer. Fatigue and weakness. Assessment of response to chemotherapeutic regimen. EXAM: CT CHEST, ABDOMEN, AND PELVIS WITH CONTRAST TECHNIQUE: Multidetector CT imaging of the chest, abdomen and pelvis was performed following the standard protocol during bolus administration of intravenous contrast. CONTRAST:  42m OMNIPAQUE IOHEXOL 300 MG/ML  SOLN COMPARISON:  Multiple exams, including 10/28/2018 FINDINGS: CT CHEST FINDINGS Cardiovascular: Right internal jugular Port-A-Cath, filling defect with shell like calcification along the margin in the right internal jugular vein for example on image 3/2 is stable and likely due to calcified chronic thrombus or calcified fibrin sheath in this vicinity. The catheter tip is at the cavoatrial junction. Atherosclerotic calcification of the aortic arch and left anterior descending coronary artery. Mediastinum/Nodes: Mild cardiomegaly. Contrast medium in the esophagus, potentially from reflux or dysmotility. Lungs/Pleura: Stable small left pleural effusion. New 3.3 by 2.2 cm region of ground-glass opacity in the right lower lobe on image 89/3. Stable volume loss and interstitial accentuation in the left upper lobe with radiation therapy related findings anteriorly in the left upper lobe. Stable interstitial accentuation and mild volume loss in a bandlike configuration in the left lower lobe. No well-defined nodule. Musculoskeletal: Right glenohumeral prosthesis. Patchy sclerosis and lucency throughout the skeleton compatible with widespread osseous metastatic disease, with increase in the degree of sclerosis in involved segments but no new or enlarging  lesions observed. Increasing sclerosis can frequently represent response to effective interval therapy. Roughly stable appearance of the pathologic fractures of the right fifth and left medial fifth ribs. Stable osteochondral fragment in the left subscapular recess. CT ABDOMEN PELVIS FINDINGS Hepatobiliary: Architectural distortion of the liver compatible with cirrhosis or pseudo cirrhosis. Scattered small hypodense rounded and linear lesions present in the liver with a dominant segment V lesion with indistinct margins and stellate configuration, measuring roughly 4.5 by 4.3 cm, previously 4.1 by 3.1 cm. Pancreas: Unremarkable Spleen: Accentuated enhancement in  a peripheral 1.1 by 0.9 cm splenic lesion on image 47/2, with overlying capsular retraction. This lesion is believed to been present since at least 12/27/2015 and is probably a hemangioma or small vascular malformation based on appearance. Adrenals/Urinary Tract: 2 mm right kidney lower pole nonobstructive renal calculus on image 55/5. Left peripelvic renal cysts. Adrenal glands normal. Urinary bladder unremarkable. Stomach/Bowel: Prominent stool throughout the colon favors constipation. Vascular/Lymphatic: Aortoiliac atherosclerotic vascular disease. No pathologic adenopathy. Reproductive: Unremarkable Other: Moderate perihepatic ascites, a significant change from prior. Small amount of free fluid in perisplenic region and paracolic gutters. Small amount of free pelvic fluid. Musculoskeletal: No new or enlarging lesions identified. Widespread sclerotic lesions demonstrates some mild increase in sclerosis compared to previous. Lower thoracic and lumbar spondylosis and degenerative disc disease causing multilevel impingement most notable at L4-5. IMPRESSION: 1. The dominant liver lesion in segment 5 has increased in size compared to the prior exam. Other smaller liver lesions are stable. 2. Moderate ascites, a significant change from prior. 3. Widespread  osseous metastatic disease demonstrates increased sclerosis but no increase in size or distribution. This may reflect a response to therapy. 4. New 3.3 by 2.2 cm region of ground-glass opacity in the right lower lobe, probably inflammatory or infectious. 5. Other imaging findings of potential clinical significance: Coronary atherosclerosis. Mild cardiomegaly. Stable small left pleural effusion. Hepatic cirrhosis or pseudocirrhosis. Stable small splenic lesion, likely a hemangioma or small vascular malformation. Prominent stool throughout the colon favors constipation. Stable appearance of the Port-A-Cath with chronic thrombus or calcified fibrin sheath along the catheter margin of the right internal jugular vein. Stable appearance of pathologic fractures of the right fifth and left medial fifth ribs. Lower thoracic and lumbar spondylosis and degenerative disc disease causing multilevel impingement. Stable osteochondral fragment in the left subscapular recess. 2 mm right kidney lower pole nonobstructive renal calculus. Left peripelvic renal cysts. Aortoiliac atherosclerotic vascular disease. Aortic Atherosclerosis (ICD10-I70.0). Electronically Signed   By: Van Clines M.D.   On: 01/31/2019 10:35   CT Abdomen Pelvis W Contrast  Result Date: 01/31/2019 CLINICAL DATA:  Restaging of metastatic left breast cancer. Fatigue and weakness. Assessment of response to chemotherapeutic regimen. EXAM: CT CHEST, ABDOMEN, AND PELVIS WITH CONTRAST TECHNIQUE: Multidetector CT imaging of the chest, abdomen and pelvis was performed following the standard protocol during bolus administration of intravenous contrast. CONTRAST:  68m OMNIPAQUE IOHEXOL 300 MG/ML  SOLN COMPARISON:  Multiple exams, including 10/28/2018 FINDINGS: CT CHEST FINDINGS Cardiovascular: Right internal jugular Port-A-Cath, filling defect with shell like calcification along the margin in the right internal jugular vein for example on image 3/2 is stable and  likely due to calcified chronic thrombus or calcified fibrin sheath in this vicinity. The catheter tip is at the cavoatrial junction. Atherosclerotic calcification of the aortic arch and left anterior descending coronary artery. Mediastinum/Nodes: Mild cardiomegaly. Contrast medium in the esophagus, potentially from reflux or dysmotility. Lungs/Pleura: Stable small left pleural effusion. New 3.3 by 2.2 cm region of ground-glass opacity in the right lower lobe on image 89/3. Stable volume loss and interstitial accentuation in the left upper lobe with radiation therapy related findings anteriorly in the left upper lobe. Stable interstitial accentuation and mild volume loss in a bandlike configuration in the left lower lobe. No well-defined nodule. Musculoskeletal: Right glenohumeral prosthesis. Patchy sclerosis and lucency throughout the skeleton compatible with widespread osseous metastatic disease, with increase in the degree of sclerosis in involved segments but no new or enlarging lesions observed. Increasing sclerosis can frequently represent  response to effective interval therapy. Roughly stable appearance of the pathologic fractures of the right fifth and left medial fifth ribs. Stable osteochondral fragment in the left subscapular recess. CT ABDOMEN PELVIS FINDINGS Hepatobiliary: Architectural distortion of the liver compatible with cirrhosis or pseudo cirrhosis. Scattered small hypodense rounded and linear lesions present in the liver with a dominant segment V lesion with indistinct margins and stellate configuration, measuring roughly 4.5 by 4.3 cm, previously 4.1 by 3.1 cm. Pancreas: Unremarkable Spleen: Accentuated enhancement in a peripheral 1.1 by 0.9 cm splenic lesion on image 47/2, with overlying capsular retraction. This lesion is believed to been present since at least 12/27/2015 and is probably a hemangioma or small vascular malformation based on appearance. Adrenals/Urinary Tract: 2 mm right kidney  lower pole nonobstructive renal calculus on image 55/5. Left peripelvic renal cysts. Adrenal glands normal. Urinary bladder unremarkable. Stomach/Bowel: Prominent stool throughout the colon favors constipation. Vascular/Lymphatic: Aortoiliac atherosclerotic vascular disease. No pathologic adenopathy. Reproductive: Unremarkable Other: Moderate perihepatic ascites, a significant change from prior. Small amount of free fluid in perisplenic region and paracolic gutters. Small amount of free pelvic fluid. Musculoskeletal: No new or enlarging lesions identified. Widespread sclerotic lesions demonstrates some mild increase in sclerosis compared to previous. Lower thoracic and lumbar spondylosis and degenerative disc disease causing multilevel impingement most notable at L4-5. IMPRESSION: 1. The dominant liver lesion in segment 5 has increased in size compared to the prior exam. Other smaller liver lesions are stable. 2. Moderate ascites, a significant change from prior. 3. Widespread osseous metastatic disease demonstrates increased sclerosis but no increase in size or distribution. This may reflect a response to therapy. 4. New 3.3 by 2.2 cm region of ground-glass opacity in the right lower lobe, probably inflammatory or infectious. 5. Other imaging findings of potential clinical significance: Coronary atherosclerosis. Mild cardiomegaly. Stable small left pleural effusion. Hepatic cirrhosis or pseudocirrhosis. Stable small splenic lesion, likely a hemangioma or small vascular malformation. Prominent stool throughout the colon favors constipation. Stable appearance of the Port-A-Cath with chronic thrombus or calcified fibrin sheath along the catheter margin of the right internal jugular vein. Stable appearance of pathologic fractures of the right fifth and left medial fifth ribs. Lower thoracic and lumbar spondylosis and degenerative disc disease causing multilevel impingement. Stable osteochondral fragment in the left  subscapular recess. 2 mm right kidney lower pole nonobstructive renal calculus. Left peripelvic renal cysts. Aortoiliac atherosclerotic vascular disease. Aortic Atherosclerosis (ICD10-I70.0). Electronically Signed   By: Van Clines M.D.   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  PLAN:    1.  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  No other malignancy was noted outside her liver. MRI of the brain on Jun 03, 2018 did not reveal metastatic disease.  Although hospice was previously discussed as well as the possibility of gemcitabine being her only therapeutic option, given her continued improving performance status, this may no longer be the case.  Proceed with cycle 18 of single agent gemcitabine today. Given her progressive disease, patient will return to clinic in 2 weeks for further evaluation and consideration of carboplatinum and gemcitabine on day 1 with gemcitabine only on day 8.  2.  Anemia: Chronic and unchanged.  Hemoglobin improved to 11.1 today. 3.  Peripheral neuropathy: Chronic and unchanged.  4.  Back pain: MRI results from May 04, 2017 did not reveal metastatic disease.  Continue symptomatic treatment.  5.  Left IJ clot: Ultrasound results reviewed independently.  Patient is no longer taking Eliquis. 6.  Neutropenia: Resolved. 7.  Memory complaints/confusion: Patient does not complain of this today.  Repeat MRI of the brain on Jun 03, 2018 reviewed independently with no obvious intracranial abnormality.  8.  Elevated liver enzymes: Chronic and unchanged.  AST remains mildly elevated at 66. 9.  Insomnia: Patient does not complain of this today.  Continue Ambien as prescribed.   10.  Weakness and fatigue: Chronic and  unchanged.  Patient was previously given a referral to the [facility] program.  Patient expressed understanding and was in agreement with this plan. She also understands that She can call clinic at any time with any questions, concerns, or complaints.   Breast cancer   Staging form: Breast, AJCC 7th Edition     Pathologic stage from 08/11/2014: Stage IIIA (T0, N2a, cM0) - Signed by Lloyd Huger, MD on 08/11/2014   Lloyd Huger, MD   02/04/2019 2:10 PM

## 2019-02-03 ENCOUNTER — Other Ambulatory Visit: Payer: Self-pay

## 2019-02-03 NOTE — Progress Notes (Signed)
Patient pre screened for office appointment, no questions or concerns today. Patient reminded of upcoming appointment time and date. 

## 2019-02-04 ENCOUNTER — Inpatient Hospital Stay (HOSPITAL_BASED_OUTPATIENT_CLINIC_OR_DEPARTMENT_OTHER): Payer: Medicare Other | Admitting: Oncology

## 2019-02-04 ENCOUNTER — Inpatient Hospital Stay: Payer: Medicare Other

## 2019-02-04 ENCOUNTER — Other Ambulatory Visit: Payer: Self-pay

## 2019-02-04 VITALS — HR 99

## 2019-02-04 VITALS — BP 106/78 | HR 108 | Temp 96.8°F | Resp 18

## 2019-02-04 DIAGNOSIS — C50919 Malignant neoplasm of unspecified site of unspecified female breast: Secondary | ICD-10-CM

## 2019-02-04 DIAGNOSIS — C787 Secondary malignant neoplasm of liver and intrahepatic bile duct: Secondary | ICD-10-CM | POA: Diagnosis not present

## 2019-02-04 DIAGNOSIS — Z95828 Presence of other vascular implants and grafts: Secondary | ICD-10-CM

## 2019-02-04 DIAGNOSIS — C7951 Secondary malignant neoplasm of bone: Secondary | ICD-10-CM | POA: Diagnosis not present

## 2019-02-04 DIAGNOSIS — Z5111 Encounter for antineoplastic chemotherapy: Secondary | ICD-10-CM | POA: Diagnosis not present

## 2019-02-04 LAB — CBC WITH DIFFERENTIAL/PLATELET
Abs Immature Granulocytes: 0.13 10*3/uL — ABNORMAL HIGH (ref 0.00–0.07)
Basophils Absolute: 0 10*3/uL (ref 0.0–0.1)
Basophils Relative: 1 %
Eosinophils Absolute: 0.2 10*3/uL (ref 0.0–0.5)
Eosinophils Relative: 4 %
HCT: 36.4 % (ref 36.0–46.0)
Hemoglobin: 11.1 g/dL — ABNORMAL LOW (ref 12.0–15.0)
Immature Granulocytes: 2 %
Lymphocytes Relative: 12 %
Lymphs Abs: 0.7 10*3/uL (ref 0.7–4.0)
MCH: 31.5 pg (ref 26.0–34.0)
MCHC: 30.5 g/dL (ref 30.0–36.0)
MCV: 103.4 fL — ABNORMAL HIGH (ref 80.0–100.0)
Monocytes Absolute: 0.9 10*3/uL (ref 0.1–1.0)
Monocytes Relative: 17 %
Neutro Abs: 3.6 10*3/uL (ref 1.7–7.7)
Neutrophils Relative %: 64 %
Platelets: 183 10*3/uL (ref 150–400)
RBC: 3.52 MIL/uL — ABNORMAL LOW (ref 3.87–5.11)
RDW: 17.4 % — ABNORMAL HIGH (ref 11.5–15.5)
WBC: 5.5 10*3/uL (ref 4.0–10.5)
nRBC: 0 % (ref 0.0–0.2)

## 2019-02-04 LAB — COMPREHENSIVE METABOLIC PANEL
ALT: 21 U/L (ref 0–44)
AST: 66 U/L — ABNORMAL HIGH (ref 15–41)
Albumin: 2.8 g/dL — ABNORMAL LOW (ref 3.5–5.0)
Alkaline Phosphatase: 153 U/L — ABNORMAL HIGH (ref 38–126)
Anion gap: 9 (ref 5–15)
BUN: 16 mg/dL (ref 8–23)
CO2: 23 mmol/L (ref 22–32)
Calcium: 8.5 mg/dL — ABNORMAL LOW (ref 8.9–10.3)
Chloride: 107 mmol/L (ref 98–111)
Creatinine, Ser: 0.64 mg/dL (ref 0.44–1.00)
GFR calc Af Amer: >60 mL/min (ref >60)
GFR calc non Af Amer: >60 mL/min (ref >60)
Glucose, Bld: 165 mg/dL — ABNORMAL HIGH (ref 70–99)
Potassium: 3.9 mmol/L (ref 3.5–5.1)
Sodium: 139 mmol/L (ref 135–145)
Total Bilirubin: 0.6 mg/dL (ref 0.3–1.2)
Total Protein: 5.6 g/dL — ABNORMAL LOW (ref 6.5–8.1)

## 2019-02-04 MED ORDER — HEPARIN SOD (PORK) LOCK FLUSH 100 UNIT/ML IV SOLN
500.0000 [IU] | Freq: Once | INTRAVENOUS | Status: AC | PRN
  Administered 2019-02-04: 500 [IU]
  Filled 2019-02-04: qty 5

## 2019-02-04 MED ORDER — SODIUM CHLORIDE 0.9% FLUSH
10.0000 mL | Freq: Once | INTRAVENOUS | Status: AC
  Administered 2019-02-04: 10 mL via INTRAVENOUS
  Filled 2019-02-04: qty 10

## 2019-02-04 MED ORDER — SODIUM CHLORIDE 0.9 % IV SOLN
1400.0000 mg | Freq: Once | INTRAVENOUS | Status: AC
  Administered 2019-02-04: 12:00:00 1400 mg via INTRAVENOUS
  Filled 2019-02-04: qty 26.3

## 2019-02-04 MED ORDER — PROCHLORPERAZINE MALEATE 10 MG PO TABS
10.0000 mg | ORAL_TABLET | Freq: Once | ORAL | Status: AC
  Administered 2019-02-04: 10 mg via ORAL
  Filled 2019-02-04: qty 1

## 2019-02-04 MED ORDER — SODIUM CHLORIDE 0.9 % IV SOLN
Freq: Once | INTRAVENOUS | Status: AC
  Filled 2019-02-04: qty 250

## 2019-02-04 MED ORDER — HEPARIN SOD (PORK) LOCK FLUSH 100 UNIT/ML IV SOLN
INTRAVENOUS | Status: AC
  Filled 2019-02-04: qty 5

## 2019-02-13 ENCOUNTER — Other Ambulatory Visit: Payer: Self-pay

## 2019-02-13 ENCOUNTER — Other Ambulatory Visit: Payer: Medicare Other | Admitting: Nurse Practitioner

## 2019-02-13 ENCOUNTER — Encounter: Payer: Self-pay | Admitting: Nurse Practitioner

## 2019-02-13 DIAGNOSIS — Z515 Encounter for palliative care: Secondary | ICD-10-CM | POA: Diagnosis not present
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
## 2019-02-14 NOTE — Progress Notes (Signed)
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  INTERVAL HISTORY: Patient returns to clinic today for further evaluation and consideration of cycle 1, day 1 of carboplatinum and gemcitabine.  She has noticed increased nausea over the past week with a poor appetite, but otherwise has felt well.  She has chronic weakness and fatigue. She denies any pain. She has chronic peripheral neuropathy, but no other neurologic complaints. She denies any recent fevers or illnesses.  She denies any chest pain, shortness of breath, cough, or hemoptysis.  She denies any vomiting, constipation, or diarrhea.  She has no urinary complaints.  Patient offers no further specific complaints today.  REVIEW OF SYSTEMS:   Review of Systems  Constitutional: Positive for malaise/fatigue. Negative for fever and weight loss.  Eyes: Negative.  Negative for blurred vision, double vision and pain.  Respiratory: Negative.  Negative for cough and shortness of breath.   Cardiovascular: Negative.  Negative for chest pain and leg swelling.  Gastrointestinal: Positive for nausea. Negative for abdominal pain.  Genitourinary: Negative.  Negative for dysuria and flank pain.  Musculoskeletal: Negative for back pain and falls.  Skin: Negative.  Negative for rash.  Neurological: Positive for tingling, sensory change and weakness. Negative for focal weakness and headaches.  Endo/Heme/Allergies: Negative.   Psychiatric/Behavioral: Negative.  Negative for memory loss. The patient is not nervous/anxious and does not have insomnia.     As per HPI. Otherwise, a complete review of systems is  negative.  PAST MEDICAL HISTORY: Past Medical History:  Diagnosis Date  . Anxiety   . Back pain    occasionally  . Breast cancer, left ([place]) 2009  . Depression    takes Paxil and Wellbutrin daily  . Genetic testing 02/03/2017   Multi-Cancer panel (83 genes) @ [facility] - No pathogenic mutations detected  . GERD (gastroesophageal reflux disease)    occasional  . History of bronchitis 2 yrs ago  . History of kidney stones   . Hyperlipidemia    takes Atorvastatin daily  . Insomnia    takes Ambien nightly  . Insomnia    takes gabapentin nightly  . Joint pain   . Mood swings   . Osteoarthritis of knee   . Personal history of chemotherapy 12/05/2016   Mets from Breast Cancer  . Personal history of radiation therapy 11/2016  . Pneumonia   . PONV (postoperative nausea and vomiting)   . Seasonal allergies    takes Allegra daily    PAST SURGICAL HISTORY: Past Surgical History:  Procedure Laterality Date  . BREAST BIOPSY  2009  . cataract surgery Bilateral   . COLONOSCOPY    . IR RADIOLOGIST EVAL & MGMT  01/29/2018  . JOINT REPLACEMENT Left 2014   knee  . KNEE ARTHROSCOPY Left    x 5  . MASTECTOMY Left   . port a cath placed    . PORTACATH PLACEMENT N/A 09/17/2015   Procedure: INSERTION PORT-A-CATH;  Surgeon: Jules Husbands, MD;  Location: [facility];  Service: General;  Laterality: N/A;  . RADIOLOGY WITH ANESTHESIA N/A 02/20/2018   Procedure: CT WITH ANESTHESIA MICROWAVE THERMAL ABLATION-LIVER;  Surgeon: Jacqulynn Cadet, MD;  Location: [facility];  Service: Anesthesiology;  Laterality: N/A;  . TOOTH EXTRACTION    . TOTAL SHOULDER ARTHROPLASTY Right 06/03/2015   Procedure: TOTAL SHOULDER ARTHROPLASTY;  Surgeon: Tania Ade, MD;  Location: [facility];  Service: Orthopedics;  Laterality: Right;  Right total shoulder arthroplasty  . TOTAL SHOULDER REPLACEMENT Right 06/03/2015  . TUBAL LIGATION      FAMILY HISTORY: Father with non-Hodgkin's lymphoma, 2 paternal aunts with breast  cancer.     ADVANCED DIRECTIVES:    HEALTH MAINTENANCE: Social History   Tobacco Use  . Smoking status: Never Smoker  . Smokeless tobacco: Never Used  Substance Use Topics  . Alcohol use: Yes    Alcohol/week: 0.0 standard drinks    Comment: occasionally wine  . Drug use: Yes    Types: Marijuana    Comment: cannabis with no extra  low dose edibles  for neuropathy     Colonoscopy:  PAP:  Bone density:  Lipid panel:  Allergies  Allergen Reactions  . Ace Inhibitors Cough  . Latex Itching  . Morphine And Related Itching    Caused her to itch terribly. Would prefer if given to take with a benadryl  . Penicillins Rash    Has patient had a PCN reaction causing immediate rash, facial/tongue/throat swelling, SOB or lightheadedness with hypotension: No Has patient had a PCN reaction causing severe rash involving mucus membranes or skin necrosis: No Has patient had a PCN reaction that required hospitalization No Has patient had a PCN reaction occurring within the last 10 years: No If all of the above answers are "NO", then may proceed with Cephalosporin use.    Current Outpatient Medications  Medication Sig Dispense Refill  . acetaminophen (TYLENOL) 500 MG tablet Take 500 mg by mouth every 6 (six) hours as needed for mild pain or moderate pain.     Marland Kitchen atorvastatin (LIPITOR) 10 MG tablet Take 10 mg by mouth at bedtime.     Marland Kitchen buPROPion (WELLBUTRIN XL) 150 MG 24 hr tablet Take 1 tablet (150 mg total) by mouth at bedtime. 90 tablet 0  . Calcium-Magnesium-Vitamin D (CALCIUM 1200+D3 PO) Take 1 tablet by mouth daily.     . ergocalciferol (VITAMIN D2) 1.25 MG (50000 UT) capsule Vitamin D2 1,250 mcg (50,000 unit) capsule  TAKE 1 CAPSULE (50,000 UNITS TOTAL) BY MOUTH EVERY 7 (SEVEN) DAYS.    . fexofenadine (ALLEGRA) 180 MG tablet Take 180 mg by mouth at bedtime.     . Oxycodone HCl 10 MG TABS Take 1 tablet (10 mg total) by mouth every 4 (four) hours as needed. Ok to take every 4-6 hours as  needed. 90 tablet 0  . PARoxetine (PAXIL) 40 MG tablet Take 40 mg by mouth at bedtime.     . lidocaine-prilocaine (EMLA) cream Apply 1 application topically as needed. Apply to port 1-2 hours prior to chemotherapy appointment. Cover with plastic wrap. (Patient not taking: Reported on 02/18/2019) 30 g 0  . pantoprazole (PROTONIX) 40 MG tablet Take 40 mg by mouth at bedtime as needed.     . prochlorperazine (COMPAZINE) 10 MG tablet TAKE 1 TABLET BY MOUTH EVERY 6 HOURS AS NEEDED FOR  NAUSEA OR  VOMITING (Patient not taking: Reported on 02/18/2019) 120 tablet 1  . sodium chloride (MURO 128) 5 % ophthalmic solution Place 1 drop into both eyes 4 (four) times daily.    Marland Kitchen zolpidem (AMBIEN) 10 MG tablet Take 1 tablet (10 mg total) by mouth at bedtime as needed (sleep). (Patient not taking: Reported on 02/18/2019) 90 tablet  0   No current facility-administered medications for this visit.    OBJECTIVE: Vitals:   02/18/19 1033  BP: 121/82  Pulse: 98  Resp: 18  Temp: (!) 96.8 F (36 C)     Body mass index is 25.58 kg/m.    ECOG FS:1 - Symptomatic but completely ambulatory  General: Thin, no acute distress.  Sitting in a wheelchair. Eyes: Pink conjunctiva, anicteric sclera. HEENT: Normocephalic, moist mucous membranes. Lungs: No audible wheezing or coughing. Heart: Regular rate and rhythm. Abdomen: Soft, nontender, no obvious distention. Musculoskeletal: No edema, cyanosis, or clubbing. Neuro: Alert, answering all questions appropriately. Cranial nerves grossly intact. Skin: No rashes or petechiae noted. Psych: Normal affect.   LAB RESULTS:  Lab Results  Component Value Date   NA 137 02/18/2019   K 4.1 02/18/2019   CL 106 02/18/2019   CO2 22 02/18/2019   GLUCOSE 174 (H) 02/18/2019   BUN 15 02/18/2019   CREATININE 0.77 02/18/2019   CALCIUM 8.7 (L) 02/18/2019   PROT 5.3 (L) 02/18/2019   ALBUMIN 2.6 (L) 02/18/2019   AST 64 (H) 02/18/2019   ALT 22 02/18/2019   ALKPHOS 171 (H) 02/18/2019    BILITOT 0.6 02/18/2019   GFRNONAA >60 02/18/2019   GFRAA >60 02/18/2019    Lab Results  Component Value Date   WBC 5.5 02/18/2019   NEUTROABS 3.3 02/18/2019   HGB 10.5 (L) 02/18/2019   HCT 34.7 (L) 02/18/2019   MCV 104.5 (H) 02/18/2019   PLT 172 02/18/2019     STUDIES: CT Chest W Contrast  Result Date: 01/31/2019 CLINICAL DATA:  Restaging of metastatic left breast cancer. Fatigue and weakness. Assessment of response to chemotherapeutic regimen. EXAM: CT CHEST, ABDOMEN, AND PELVIS WITH CONTRAST TECHNIQUE: Multidetector CT imaging of the chest, abdomen and pelvis was performed following the standard protocol during bolus administration of intravenous contrast. CONTRAST:  8m OMNIPAQUE IOHEXOL 300 MG/ML  SOLN COMPARISON:  Multiple exams, including 10/28/2018 FINDINGS: CT CHEST FINDINGS Cardiovascular: Right internal jugular Port-A-Cath, filling defect with shell like calcification along the margin in the right internal jugular vein for example on image 3/2 is stable and likely due to calcified chronic thrombus or calcified fibrin sheath in this vicinity. The catheter tip is at the cavoatrial junction. Atherosclerotic calcification of the aortic arch and left anterior descending coronary artery. Mediastinum/Nodes: Mild cardiomegaly. Contrast medium in the esophagus, potentially from reflux or dysmotility. Lungs/Pleura: Stable small left pleural effusion. New 3.3 by 2.2 cm region of ground-glass opacity in the right lower lobe on image 89/3. Stable volume loss and interstitial accentuation in the left upper lobe with radiation therapy related findings anteriorly in the left upper lobe. Stable interstitial accentuation and mild volume loss in a bandlike configuration in the left lower lobe. No well-defined nodule. Musculoskeletal: Right glenohumeral prosthesis. Patchy sclerosis and lucency throughout the skeleton compatible with widespread osseous metastatic disease, with increase in the degree of  sclerosis in involved segments but no new or enlarging lesions observed. Increasing sclerosis can frequently represent response to effective interval therapy. Roughly stable appearance of the pathologic fractures of the right fifth and left medial fifth ribs. Stable osteochondral fragment in the left subscapular recess. CT ABDOMEN PELVIS FINDINGS Hepatobiliary: Architectural distortion of the liver compatible with cirrhosis or pseudo cirrhosis. Scattered small hypodense rounded and linear lesions present in the liver with a dominant segment V lesion with indistinct margins and stellate configuration, measuring roughly 4.5 by 4.3 cm, previously 4.1 by 3.1 cm. Pancreas: Unremarkable Spleen: Accentuated enhancement  in a peripheral 1.1 by 0.9 cm splenic lesion on image 47/2, with overlying capsular retraction. This lesion is believed to been present since at least 12/27/2015 and is probably a hemangioma or small vascular malformation based on appearance. Adrenals/Urinary Tract: 2 mm right kidney lower pole nonobstructive renal calculus on image 55/5. Left peripelvic renal cysts. Adrenal glands normal. Urinary bladder unremarkable. Stomach/Bowel: Prominent stool throughout the colon favors constipation. Vascular/Lymphatic: Aortoiliac atherosclerotic vascular disease. No pathologic adenopathy. Reproductive: Unremarkable Other: Moderate perihepatic ascites, a significant change from prior. Small amount of free fluid in perisplenic region and paracolic gutters. Small amount of free pelvic fluid. Musculoskeletal: No new or enlarging lesions identified. Widespread sclerotic lesions demonstrates some mild increase in sclerosis compared to previous. Lower thoracic and lumbar spondylosis and degenerative disc disease causing multilevel impingement most notable at L4-5. IMPRESSION: 1. The dominant liver lesion in segment 5 has increased in size compared to the prior exam. Other smaller liver lesions are stable. 2. Moderate  ascites, a significant change from prior. 3. Widespread osseous metastatic disease demonstrates increased sclerosis but no increase in size or distribution. This may reflect a response to therapy. 4. New 3.3 by 2.2 cm region of ground-glass opacity in the right lower lobe, probably inflammatory or infectious. 5. Other imaging findings of potential clinical significance: Coronary atherosclerosis. Mild cardiomegaly. Stable small left pleural effusion. Hepatic cirrhosis or pseudocirrhosis. Stable small splenic lesion, likely a hemangioma or small vascular malformation. Prominent stool throughout the colon favors constipation. Stable appearance of the Port-A-Cath with chronic thrombus or calcified fibrin sheath along the catheter margin of the right internal jugular vein. Stable appearance of pathologic fractures of the right fifth and left medial fifth ribs. Lower thoracic and lumbar spondylosis and degenerative disc disease causing multilevel impingement. Stable osteochondral fragment in the left subscapular recess. 2 mm right kidney lower pole nonobstructive renal calculus. Left peripelvic renal cysts. Aortoiliac atherosclerotic vascular disease. Aortic Atherosclerosis (ICD10-I70.0). Electronically Signed   By: Van Clines M.D.   On: 01/31/2019 10:35   CT Abdomen Pelvis W Contrast  Result Date: 01/31/2019 CLINICAL DATA:  Restaging of metastatic left breast cancer. Fatigue and weakness. Assessment of response to chemotherapeutic regimen. EXAM: CT CHEST, ABDOMEN, AND PELVIS WITH CONTRAST TECHNIQUE: Multidetector CT imaging of the chest, abdomen and pelvis was performed following the standard protocol during bolus administration of intravenous contrast. CONTRAST:  59m OMNIPAQUE IOHEXOL 300 MG/ML  SOLN COMPARISON:  Multiple exams, including 10/28/2018 FINDINGS: CT CHEST FINDINGS Cardiovascular: Right internal jugular Port-A-Cath, filling defect with shell like calcification along the margin in the right  internal jugular vein for example on image 3/2 is stable and likely due to calcified chronic thrombus or calcified fibrin sheath in this vicinity. The catheter tip is at the cavoatrial junction. Atherosclerotic calcification of the aortic arch and left anterior descending coronary artery. Mediastinum/Nodes: Mild cardiomegaly. Contrast medium in the esophagus, potentially from reflux or dysmotility. Lungs/Pleura: Stable small left pleural effusion. New 3.3 by 2.2 cm region of ground-glass opacity in the right lower lobe on image 89/3. Stable volume loss and interstitial accentuation in the left upper lobe with radiation therapy related findings anteriorly in the left upper lobe. Stable interstitial accentuation and mild volume loss in a bandlike configuration in the left lower lobe. No well-defined nodule. Musculoskeletal: Right glenohumeral prosthesis. Patchy sclerosis and lucency throughout the skeleton compatible with widespread osseous metastatic disease, with increase in the degree of sclerosis in involved segments but no new or enlarging lesions observed. Increasing sclerosis can frequently  represent response to effective interval therapy. Roughly stable appearance of the pathologic fractures of the right fifth and left medial fifth ribs. Stable osteochondral fragment in the left subscapular recess. CT ABDOMEN PELVIS FINDINGS Hepatobiliary: Architectural distortion of the liver compatible with cirrhosis or pseudo cirrhosis. Scattered small hypodense rounded and linear lesions present in the liver with a dominant segment V lesion with indistinct margins and stellate configuration, measuring roughly 4.5 by 4.3 cm, previously 4.1 by 3.1 cm. Pancreas: Unremarkable Spleen: Accentuated enhancement in a peripheral 1.1 by 0.9 cm splenic lesion on image 47/2, with overlying capsular retraction. This lesion is believed to been present since at least 12/27/2015 and is probably a hemangioma or small vascular malformation  based on appearance. Adrenals/Urinary Tract: 2 mm right kidney lower pole nonobstructive renal calculus on image 55/5. Left peripelvic renal cysts. Adrenal glands normal. Urinary bladder unremarkable. Stomach/Bowel: Prominent stool throughout the colon favors constipation. Vascular/Lymphatic: Aortoiliac atherosclerotic vascular disease. No pathologic adenopathy. Reproductive: Unremarkable Other: Moderate perihepatic ascites, a significant change from prior. Small amount of free fluid in perisplenic region and paracolic gutters. Small amount of free pelvic fluid. Musculoskeletal: No new or enlarging lesions identified. Widespread sclerotic lesions demonstrates some mild increase in sclerosis compared to previous. Lower thoracic and lumbar spondylosis and degenerative disc disease causing multilevel impingement most notable at L4-5. IMPRESSION: 1. The dominant liver lesion in segment 5 has increased in size compared to the prior exam. Other smaller liver lesions are stable. 2. Moderate ascites, a significant change from prior. 3. Widespread osseous metastatic disease demonstrates increased sclerosis but no increase in size or distribution. This may reflect a response to therapy. 4. New 3.3 by 2.2 cm region of ground-glass opacity in the right lower lobe, probably inflammatory or infectious. 5. Other imaging findings of potential clinical significance: Coronary atherosclerosis. Mild cardiomegaly. Stable small left pleural effusion. Hepatic cirrhosis or pseudocirrhosis. Stable small splenic lesion, likely a hemangioma or small vascular malformation. Prominent stool throughout the colon favors constipation. Stable appearance of the Port-A-Cath with chronic thrombus or calcified fibrin sheath along the catheter margin of the right internal jugular vein. Stable appearance of pathologic fractures of the right fifth and left medial fifth ribs. Lower thoracic and lumbar spondylosis and degenerative disc disease causing  multilevel impingement. Stable osteochondral fragment in the left subscapular recess. 2 mm right kidney lower pole nonobstructive renal calculus. Left peripelvic renal cysts. Aortoiliac atherosclerotic vascular disease. Aortic Atherosclerosis (ICD10-I70.0). Electronically Signed   By: Van Clines M.D.   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  PLAN:    1.  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  No other malignancy was noted outside her liver. MRI of the brain on Jun 03, 2018 did not reveal metastatic disease.  Although hospice was previously discussed, patient wishes to continue aggressive treatment.  We will proceed with gemcitabine and carboplatinum on days 1 and 8 with a 15 off provided her blood counts are adequate.  Proceed with cycle 1, day 1 of gemcitabine and carboplatin today.  Return to clinic in 1 week for further evaluation and consideration of cycle 1, day 8.  2.  Anemia: Hemoglobin has trended down slightly to 10.5, monitor. 3.  Peripheral neuropathy: Chronic and unchanged.   4.  Back pain: MRI  results from May 04, 2017 did not reveal metastatic disease.  Continue symptomatic treatment.  5.  Left IJ clot: Ultrasound results reviewed independently.  Patient is no longer taking Eliquis. 6.  Neutropenia: Resolved. 7.  Memory complaints/confusion: Patient does not complain of this today.  Repeat MRI of the brain on Jun 03, 2018 reviewed independently with no obvious intracranial abnormality.  8.  Elevated liver enzymes: Chronic and unchanged.  AST remains mildly elevated at 64. 9.  Insomnia: Patient does not complain of this today.  Continue Ambien as prescribed.   10.  Weakness and fatigue: Chronic and unchanged.   Patient was previously given a referral to the [facility] program.  Patient expressed understanding and was in agreement with this plan. She also understands that She can call clinic at any time with any questions, concerns, or complaints.   Breast cancer   Staging form: Breast, AJCC 7th Edition     Pathologic stage from 08/11/2014: Stage IIIA (T0, N2a, cM0) - Signed by Lloyd Huger, MD on 08/11/2014   Lloyd Huger, MD   02/18/2019 1:51 PM

## 2019-02-18 ENCOUNTER — Inpatient Hospital Stay: Payer: Medicare Other

## 2019-02-18 ENCOUNTER — Inpatient Hospital Stay (HOSPITAL_BASED_OUTPATIENT_CLINIC_OR_DEPARTMENT_OTHER): Payer: Medicare Other | Admitting: Oncology

## 2019-02-18 ENCOUNTER — Encounter: Payer: Self-pay | Admitting: Oncology

## 2019-02-18 ENCOUNTER — Other Ambulatory Visit: Payer: Self-pay

## 2019-02-18 ENCOUNTER — Inpatient Hospital Stay: Payer: Medicare Other | Attending: Oncology

## 2019-02-18 VITALS — BP 137/81 | HR 93

## 2019-02-18 VITALS — BP 121/82 | HR 98 | Temp 96.8°F | Resp 18 | Wt 149.0 lb

## 2019-02-18 DIAGNOSIS — C787 Secondary malignant neoplasm of liver and intrahepatic bile duct: Secondary | ICD-10-CM | POA: Insufficient documentation

## 2019-02-18 DIAGNOSIS — Z170 Estrogen receptor positive status [ER+]: Secondary | ICD-10-CM | POA: Diagnosis not present

## 2019-02-18 DIAGNOSIS — Z95828 Presence of other vascular implants and grafts: Secondary | ICD-10-CM

## 2019-02-18 DIAGNOSIS — Z66 Do not resuscitate: Secondary | ICD-10-CM | POA: Insufficient documentation

## 2019-02-18 DIAGNOSIS — C50919 Malignant neoplasm of unspecified site of unspecified female breast: Secondary | ICD-10-CM | POA: Insufficient documentation

## 2019-02-18 DIAGNOSIS — C7951 Secondary malignant neoplasm of bone: Secondary | ICD-10-CM | POA: Insufficient documentation

## 2019-02-18 DIAGNOSIS — Z5111 Encounter for antineoplastic chemotherapy: Secondary | ICD-10-CM | POA: Diagnosis not present

## 2019-02-18 DIAGNOSIS — R601 Generalized edema: Secondary | ICD-10-CM | POA: Insufficient documentation

## 2019-02-18 DIAGNOSIS — Z79899 Other long term (current) drug therapy: Secondary | ICD-10-CM | POA: Diagnosis not present

## 2019-02-18 LAB — CBC WITH DIFFERENTIAL/PLATELET
Abs Immature Granulocytes: 0.14 10*3/uL — ABNORMAL HIGH (ref 0.00–0.07)
Basophils Absolute: 0.1 10*3/uL (ref 0.0–0.1)
Basophils Relative: 1 %
Eosinophils Absolute: 0.3 10*3/uL (ref 0.0–0.5)
Eosinophils Relative: 5 %
HCT: 34.7 % — ABNORMAL LOW (ref 36.0–46.0)
Hemoglobin: 10.5 g/dL — ABNORMAL LOW (ref 12.0–15.0)
Immature Granulocytes: 3 %
Lymphocytes Relative: 13 %
Lymphs Abs: 0.7 10*3/uL (ref 0.7–4.0)
MCH: 31.6 pg (ref 26.0–34.0)
MCHC: 30.3 g/dL (ref 30.0–36.0)
MCV: 104.5 fL — ABNORMAL HIGH (ref 80.0–100.0)
Monocytes Absolute: 1 10*3/uL (ref 0.1–1.0)
Monocytes Relative: 19 %
Neutro Abs: 3.3 10*3/uL (ref 1.7–7.7)
Neutrophils Relative %: 59 %
Platelets: 172 10*3/uL (ref 150–400)
RBC: 3.32 MIL/uL — ABNORMAL LOW (ref 3.87–5.11)
RDW: 17.6 % — ABNORMAL HIGH (ref 11.5–15.5)
WBC: 5.5 10*3/uL (ref 4.0–10.5)
nRBC: 0 % (ref 0.0–0.2)

## 2019-02-18 LAB — COMPREHENSIVE METABOLIC PANEL
ALT: 22 U/L (ref 0–44)
AST: 64 U/L — ABNORMAL HIGH (ref 15–41)
Albumin: 2.6 g/dL — ABNORMAL LOW (ref 3.5–5.0)
Alkaline Phosphatase: 171 U/L — ABNORMAL HIGH (ref 38–126)
Anion gap: 9 (ref 5–15)
BUN: 15 mg/dL (ref 8–23)
CO2: 22 mmol/L (ref 22–32)
Calcium: 8.7 mg/dL — ABNORMAL LOW (ref 8.9–10.3)
Chloride: 106 mmol/L (ref 98–111)
Creatinine, Ser: 0.77 mg/dL (ref 0.44–1.00)
GFR calc Af Amer: >60 mL/min (ref >60)
GFR calc non Af Amer: >60 mL/min (ref >60)
Glucose, Bld: 174 mg/dL — ABNORMAL HIGH (ref 70–99)
Potassium: 4.1 mmol/L (ref 3.5–5.1)
Sodium: 137 mmol/L (ref 135–145)
Total Bilirubin: 0.6 mg/dL (ref 0.3–1.2)
Total Protein: 5.3 g/dL — ABNORMAL LOW (ref 6.5–8.1)

## 2019-02-18 MED ORDER — SODIUM CHLORIDE 0.9 % IV SOLN
Freq: Once | INTRAVENOUS | Status: AC
  Filled 2019-02-18: qty 250

## 2019-02-18 MED ORDER — SODIUM CHLORIDE 0.9 % IV SOLN
10.0000 mg | Freq: Once | INTRAVENOUS | Status: AC
  Administered 2019-02-18: 12:00:00 10 mg via INTRAVENOUS
  Filled 2019-02-18: qty 10

## 2019-02-18 MED ORDER — SODIUM CHLORIDE 0.9 % IV SOLN
166.6000 mg | Freq: Once | INTRAVENOUS | Status: AC
  Administered 2019-02-18: 170 mg via INTRAVENOUS
  Filled 2019-02-18: qty 17

## 2019-02-18 MED ORDER — HEPARIN SOD (PORK) LOCK FLUSH 100 UNIT/ML IV SOLN
500.0000 [IU] | Freq: Once | INTRAVENOUS | Status: AC | PRN
  Administered 2019-02-18: 14:00:00 500 [IU]
  Filled 2019-02-18: qty 5

## 2019-02-18 MED ORDER — SODIUM CHLORIDE 0.9% FLUSH
10.0000 mL | Freq: Once | INTRAVENOUS | Status: AC
  Administered 2019-02-18: 10 mL via INTRAVENOUS
  Filled 2019-02-18: qty 10

## 2019-02-18 MED ORDER — HEPARIN SOD (PORK) LOCK FLUSH 100 UNIT/ML IV SOLN
INTRAVENOUS | Status: AC
  Filled 2019-02-18: qty 5

## 2019-02-18 MED ORDER — PALONOSETRON HCL INJECTION 0.25 MG/5ML
0.2500 mg | Freq: Once | INTRAVENOUS | Status: AC
  Administered 2019-02-18: 12:00:00 0.25 mg via INTRAVENOUS
  Filled 2019-02-18: qty 5

## 2019-02-18 MED ORDER — SODIUM CHLORIDE 0.9 % IV SOLN
1600.0000 mg | Freq: Once | INTRAVENOUS | Status: AC
  Administered 2019-02-18: 1600 mg via INTRAVENOUS
  Filled 2019-02-18: qty 26.3

## 2019-02-21 NOTE — Progress Notes (Signed)
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  INTERVAL HISTORY: Patient returns to clinic today for further evaluation and consideration of cycle 1, day 8 of carboplatinum and gemcitabine.  She tolerated her first treatment well without significant side effects.  She admits to decreased appetite, but has had weight gain in the interim possibly secondary to fluid retention.  She has chronic weakness and fatigue. She denies any pain. She has chronic peripheral neuropathy, but no other neurologic complaints. She denies any recent fevers or illnesses.  She denies any chest pain, shortness of breath, cough, or hemoptysis.  She denies any vomiting, constipation, or diarrhea.  She has no urinary complaints.  Patient offers no further specific complaints today.  REVIEW OF SYSTEMS:   Review of Systems  Constitutional: Positive for malaise/fatigue. Negative for fever and weight loss.  Eyes: Negative.  Negative for blurred vision, double vision and pain.  Respiratory: Negative.  Negative for cough and shortness of breath.   Cardiovascular: Negative.  Negative for chest pain and leg swelling.  Gastrointestinal: Positive for nausea. Negative for abdominal pain.  Genitourinary: Negative.  Negative for dysuria and flank pain.  Musculoskeletal: Negative for back pain and falls.  Skin: Negative.  Negative for rash.  Neurological: Positive for tingling, sensory change and weakness. Negative for focal weakness and headaches.  Endo/Heme/Allergies: Negative.   Psychiatric/Behavioral: Negative.  Negative for memory loss. The patient is not nervous/anxious and does not have  insomnia.     As per HPI. Otherwise, a complete review of systems is negative.  PAST MEDICAL HISTORY: Past Medical History:  Diagnosis Date  . Anxiety   . Back pain    occasionally  . Breast cancer, left ([place]) 2009  . Depression    takes Paxil and Wellbutrin daily  . Genetic testing 02/03/2017   Multi-Cancer panel (83 genes) @ [facility] - No pathogenic mutations detected  . GERD (gastroesophageal reflux disease)    occasional  . History of bronchitis 2 yrs ago  . History of kidney stones   . Hyperlipidemia    takes Atorvastatin daily  . Insomnia    takes Ambien nightly  . Insomnia    takes gabapentin nightly  . Joint pain   . Mood swings   . Osteoarthritis of knee   . Personal history of chemotherapy 12/05/2016   Mets from Breast Cancer  . Personal history of radiation therapy 11/2016  . Pneumonia   . PONV (postoperative nausea and vomiting)   . Seasonal allergies    takes Allegra daily    PAST SURGICAL HISTORY: Past Surgical History:  Procedure Laterality Date  . BREAST BIOPSY  2009  . cataract surgery Bilateral   . COLONOSCOPY    . IR RADIOLOGIST EVAL & MGMT  01/29/2018  . JOINT REPLACEMENT Left 2014   knee  . KNEE ARTHROSCOPY Left    x 5  . MASTECTOMY Left   . port a cath placed    . PORTACATH PLACEMENT N/A 09/17/2015   Procedure: INSERTION PORT-A-CATH;  Surgeon: Jules Husbands, MD;  Location: [facility];  Service: General;  Laterality: N/A;  . RADIOLOGY WITH ANESTHESIA N/A 02/20/2018   Procedure: CT WITH ANESTHESIA MICROWAVE THERMAL ABLATION-LIVER;  Surgeon:  Jacqulynn Cadet, MD;  Location: [facility];  Service: Anesthesiology;  Laterality: N/A;  . TOOTH EXTRACTION    . TOTAL SHOULDER ARTHROPLASTY Right 06/03/2015   Procedure: TOTAL SHOULDER ARTHROPLASTY;  Surgeon: Tania Ade, MD;  Location: [place];  Service: Orthopedics;  Laterality: Right;  Right total shoulder arthroplasty  . TOTAL SHOULDER REPLACEMENT Right 06/03/2015  . TUBAL LIGATION      FAMILY HISTORY:  Father with non-Hodgkin's lymphoma, 2 paternal aunts with breast cancer.     ADVANCED DIRECTIVES:    HEALTH MAINTENANCE: Social History   Tobacco Use  . Smoking status: Never Smoker  . Smokeless tobacco: Never Used  Substance Use Topics  . Alcohol use: Yes    Alcohol/week: 0.0 standard drinks    Comment: occasionally wine  . Drug use: Yes    Types: Marijuana    Comment: cannabis with no extra  low dose edibles  for neuropathy     Colonoscopy:  PAP:  Bone density:  Lipid panel:  Allergies  Allergen Reactions  . Ace Inhibitors Cough  . Latex Itching  . Morphine And Related Itching    Caused her to itch terribly. Would prefer if given to take with a benadryl  . Penicillins Rash    Has patient had a PCN reaction causing immediate rash, facial/tongue/throat swelling, SOB or lightheadedness with hypotension: No Has patient had a PCN reaction causing severe rash involving mucus membranes or skin necrosis: No Has patient had a PCN reaction that required hospitalization No Has patient had a PCN reaction occurring within the last 10 years: No If all of the above answers are "NO", then may proceed with Cephalosporin use.    Current Outpatient Medications  Medication Sig Dispense Refill  . acetaminophen (TYLENOL) 500 MG tablet Take 500 mg by mouth every 6 (six) hours as needed for mild pain or moderate pain.     Marland Kitchen atorvastatin (LIPITOR) 10 MG tablet Take 10 mg by mouth at bedtime.     Marland Kitchen buPROPion (WELLBUTRIN XL) 150 MG 24 hr tablet Take 1 tablet (150 mg total) by mouth at bedtime. 90 tablet 0  . Calcium-Magnesium-Vitamin D (CALCIUM 1200+D3 PO) Take 1 tablet by mouth daily.     . ergocalciferol (VITAMIN D2) 1.25 MG (50000 UT) capsule Vitamin D2 1,250 mcg (50,000 unit) capsule  TAKE 1 CAPSULE (50,000 UNITS TOTAL) BY MOUTH EVERY 7 (SEVEN) DAYS.    . fexofenadine (ALLEGRA) 180 MG tablet Take 180 mg by mouth at bedtime.     . Oxycodone HCl 10 MG TABS Take 1 tablet (10 mg total) by  mouth every 4 (four) hours as needed. Ok to take every 4-6 hours as needed. 90 tablet 0  . pantoprazole (PROTONIX) 40 MG tablet Take 40 mg by mouth at bedtime as needed.     Marland Kitchen PARoxetine (PAXIL) 40 MG tablet Take 40 mg by mouth at bedtime.     . prochlorperazine (COMPAZINE) 10 MG tablet TAKE 1 TABLET BY MOUTH EVERY 6 HOURS AS NEEDED FOR  NAUSEA OR  VOMITING 120 tablet 1  . sodium chloride (MURO 128) 5 % ophthalmic solution Place 1 drop into both eyes 4 (four) times daily.    Marland Kitchen lidocaine-prilocaine (EMLA) cream Apply 1 application topically as needed. Apply to port 1-2 hours prior to chemotherapy appointment. Cover with plastic wrap. (Patient not taking: Reported on 02/18/2019) 30 g 0  . zolpidem (AMBIEN) 10 MG tablet Take 1 tablet (10 mg total) by mouth at bedtime as needed (sleep). (Patient not taking:  Reported on 02/18/2019) 90 tablet 0   No current facility-administered medications for this visit.    OBJECTIVE: Vitals:   02/25/19 0932  BP: 123/79  Pulse: (!) 105  Resp: 18  Temp: (!) 96.3 F (35.7 C)     Body mass index is 28.49 kg/m.    ECOG FS:2 - Symptomatic, <50% confined to bed  General: Thin, no acute distress.  Sitting in a wheelchair. Eyes: Pink conjunctiva, anicteric sclera. HEENT: Normocephalic, moist mucous membranes. Lungs: No audible wheezing or coughing. Heart: Regular rate and rhythm. Abdomen: Soft, nontender, no obvious distention. Musculoskeletal: No edema, cyanosis, or clubbing. Neuro: Alert, answering all questions appropriately. Cranial nerves grossly intact. Skin: No rashes or petechiae noted. Psych: Normal affect.  LAB RESULTS:  Lab Results  Component Value Date   NA 136 02/25/2019   K 4.0 02/25/2019   CL 105 02/25/2019   CO2 21 (L) 02/25/2019   GLUCOSE 143 (H) 02/25/2019   BUN 14 02/25/2019   CREATININE 0.72 02/25/2019   CALCIUM 8.2 (L) 02/25/2019   PROT 5.3 (L) 02/25/2019   ALBUMIN 2.5 (L) 02/25/2019   AST 81 (H) 02/25/2019   ALT 31 02/25/2019    ALKPHOS 190 (H) 02/25/2019   BILITOT 0.7 02/25/2019   GFRNONAA >60 02/25/2019   GFRAA >60 02/25/2019    Lab Results  Component Value Date   WBC 2.9 (L) 02/25/2019   NEUTROABS 1.4 (L) 02/25/2019   HGB 9.8 (L) 02/25/2019   HCT 32.1 (L) 02/25/2019   MCV 103.5 (H) 02/25/2019   PLT 131 (L) 02/25/2019     STUDIES: CT Chest W Contrast  Result Date: 01/31/2019 CLINICAL DATA:  Restaging of metastatic left breast cancer. Fatigue and weakness. Assessment of response to chemotherapeutic regimen. EXAM: CT CHEST, ABDOMEN, AND PELVIS WITH CONTRAST TECHNIQUE: Multidetector CT imaging of the chest, abdomen and pelvis was performed following the standard protocol during bolus administration of intravenous contrast. CONTRAST:  55m OMNIPAQUE IOHEXOL 300 MG/ML  SOLN COMPARISON:  Multiple exams, including 10/28/2018 FINDINGS: CT CHEST FINDINGS Cardiovascular: Right internal jugular Port-A-Cath, filling defect with shell like calcification along the margin in the right internal jugular vein for example on image 3/2 is stable and likely due to calcified chronic thrombus or calcified fibrin sheath in this vicinity. The catheter tip is at the cavoatrial junction. Atherosclerotic calcification of the aortic arch and left anterior descending coronary artery. Mediastinum/Nodes: Mild cardiomegaly. Contrast medium in the esophagus, potentially from reflux or dysmotility. Lungs/Pleura: Stable small left pleural effusion. New 3.3 by 2.2 cm region of ground-glass opacity in the right lower lobe on image 89/3. Stable volume loss and interstitial accentuation in the left upper lobe with radiation therapy related findings anteriorly in the left upper lobe. Stable interstitial accentuation and mild volume loss in a bandlike configuration in the left lower lobe. No well-defined nodule. Musculoskeletal: Right glenohumeral prosthesis. Patchy sclerosis and lucency throughout the skeleton compatible with widespread osseous metastatic  disease, with increase in the degree of sclerosis in involved segments but no new or enlarging lesions observed. Increasing sclerosis can frequently represent response to effective interval therapy. Roughly stable appearance of the pathologic fractures of the right fifth and left medial fifth ribs. Stable osteochondral fragment in the left subscapular recess. CT ABDOMEN PELVIS FINDINGS Hepatobiliary: Architectural distortion of the liver compatible with cirrhosis or pseudo cirrhosis. Scattered small hypodense rounded and linear lesions present in the liver with a dominant segment V lesion with indistinct margins and stellate configuration, measuring roughly 4.5 by 4.3 cm,  previously 4.1 by 3.1 cm. Pancreas: Unremarkable Spleen: Accentuated enhancement in a peripheral 1.1 by 0.9 cm splenic lesion on image 47/2, with overlying capsular retraction. This lesion is believed to been present since at least 12/27/2015 and is probably a hemangioma or small vascular malformation based on appearance. Adrenals/Urinary Tract: 2 mm right kidney lower pole nonobstructive renal calculus on image 55/5. Left peripelvic renal cysts. Adrenal glands normal. Urinary bladder unremarkable. Stomach/Bowel: Prominent stool throughout the colon favors constipation. Vascular/Lymphatic: Aortoiliac atherosclerotic vascular disease. No pathologic adenopathy. Reproductive: Unremarkable Other: Moderate perihepatic ascites, a significant change from prior. Small amount of free fluid in perisplenic region and paracolic gutters. Small amount of free pelvic fluid. Musculoskeletal: No new or enlarging lesions identified. Widespread sclerotic lesions demonstrates some mild increase in sclerosis compared to previous. Lower thoracic and lumbar spondylosis and degenerative disc disease causing multilevel impingement most notable at L4-5. IMPRESSION: 1. The dominant liver lesion in segment 5 has increased in size compared to the prior exam. Other smaller  liver lesions are stable. 2. Moderate ascites, a significant change from prior. 3. Widespread osseous metastatic disease demonstrates increased sclerosis but no increase in size or distribution. This may reflect a response to therapy. 4. New 3.3 by 2.2 cm region of ground-glass opacity in the right lower lobe, probably inflammatory or infectious. 5. Other imaging findings of potential clinical significance: Coronary atherosclerosis. Mild cardiomegaly. Stable small left pleural effusion. Hepatic cirrhosis or pseudocirrhosis. Stable small splenic lesion, likely a hemangioma or small vascular malformation. Prominent stool throughout the colon favors constipation. Stable appearance of the Port-A-Cath with chronic thrombus or calcified fibrin sheath along the catheter margin of the right internal jugular vein. Stable appearance of pathologic fractures of the right fifth and left medial fifth ribs. Lower thoracic and lumbar spondylosis and degenerative disc disease causing multilevel impingement. Stable osteochondral fragment in the left subscapular recess. 2 mm right kidney lower pole nonobstructive renal calculus. Left peripelvic renal cysts. Aortoiliac atherosclerotic vascular disease. Aortic Atherosclerosis (ICD10-I70.0). Electronically Signed   By: Van Clines M.D.   On: 01/31/2019 10:35   CT Abdomen Pelvis W Contrast  Result Date: 01/31/2019 CLINICAL DATA:  Restaging of metastatic left breast cancer. Fatigue and weakness. Assessment of response to chemotherapeutic regimen. EXAM: CT CHEST, ABDOMEN, AND PELVIS WITH CONTRAST TECHNIQUE: Multidetector CT imaging of the chest, abdomen and pelvis was performed following the standard protocol during bolus administration of intravenous contrast. CONTRAST:  29m OMNIPAQUE IOHEXOL 300 MG/ML  SOLN COMPARISON:  Multiple exams, including 10/28/2018 FINDINGS: CT CHEST FINDINGS Cardiovascular: Right internal jugular Port-A-Cath, filling defect with shell like  calcification along the margin in the right internal jugular vein for example on image 3/2 is stable and likely due to calcified chronic thrombus or calcified fibrin sheath in this vicinity. The catheter tip is at the cavoatrial junction. Atherosclerotic calcification of the aortic arch and left anterior descending coronary artery. Mediastinum/Nodes: Mild cardiomegaly. Contrast medium in the esophagus, potentially from reflux or dysmotility. Lungs/Pleura: Stable small left pleural effusion. New 3.3 by 2.2 cm region of ground-glass opacity in the right lower lobe on image 89/3. Stable volume loss and interstitial accentuation in the left upper lobe with radiation therapy related findings anteriorly in the left upper lobe. Stable interstitial accentuation and mild volume loss in a bandlike configuration in the left lower lobe. No well-defined nodule. Musculoskeletal: Right glenohumeral prosthesis. Patchy sclerosis and lucency throughout the skeleton compatible with widespread osseous metastatic disease, with increase in the degree of sclerosis in involved segments but  no new or enlarging lesions observed. Increasing sclerosis can frequently represent response to effective interval therapy. Roughly stable appearance of the pathologic fractures of the right fifth and left medial fifth ribs. Stable osteochondral fragment in the left subscapular recess. CT ABDOMEN PELVIS FINDINGS Hepatobiliary: Architectural distortion of the liver compatible with cirrhosis or pseudo cirrhosis. Scattered small hypodense rounded and linear lesions present in the liver with a dominant segment V lesion with indistinct margins and stellate configuration, measuring roughly 4.5 by 4.3 cm, previously 4.1 by 3.1 cm. Pancreas: Unremarkable Spleen: Accentuated enhancement in a peripheral 1.1 by 0.9 cm splenic lesion on image 47/2, with overlying capsular retraction. This lesion is believed to been present since at least 12/27/2015 and is probably a  hemangioma or small vascular malformation based on appearance. Adrenals/Urinary Tract: 2 mm right kidney lower pole nonobstructive renal calculus on image 55/5. Left peripelvic renal cysts. Adrenal glands normal. Urinary bladder unremarkable. Stomach/Bowel: Prominent stool throughout the colon favors constipation. Vascular/Lymphatic: Aortoiliac atherosclerotic vascular disease. No pathologic adenopathy. Reproductive: Unremarkable Other: Moderate perihepatic ascites, a significant change from prior. Small amount of free fluid in perisplenic region and paracolic gutters. Small amount of free pelvic fluid. Musculoskeletal: No new or enlarging lesions identified. Widespread sclerotic lesions demonstrates some mild increase in sclerosis compared to previous. Lower thoracic and lumbar spondylosis and degenerative disc disease causing multilevel impingement most notable at L4-5. IMPRESSION: 1. The dominant liver lesion in segment 5 has increased in size compared to the prior exam. Other smaller liver lesions are stable. 2. Moderate ascites, a significant change from prior. 3. Widespread osseous metastatic disease demonstrates increased sclerosis but no increase in size or distribution. This may reflect a response to therapy. 4. New 3.3 by 2.2 cm region of ground-glass opacity in the right lower lobe, probably inflammatory or infectious. 5. Other imaging findings of potential clinical significance: Coronary atherosclerosis. Mild cardiomegaly. Stable small left pleural effusion. Hepatic cirrhosis or pseudocirrhosis. Stable small splenic lesion, likely a hemangioma or small vascular malformation. Prominent stool throughout the colon favors constipation. Stable appearance of the Port-A-Cath with chronic thrombus or calcified fibrin sheath along the catheter margin of the right internal jugular vein. Stable appearance of pathologic fractures of the right fifth and left medial fifth ribs. Lower thoracic and lumbar spondylosis  and degenerative disc disease causing multilevel impingement. Stable osteochondral fragment in the left subscapular recess. 2 mm right kidney lower pole nonobstructive renal calculus. Left peripelvic renal cysts. Aortoiliac atherosclerotic vascular disease. Aortic Atherosclerosis (ICD10-I70.0). Electronically Signed   By: Van Clines M.D.   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  PLAN:    1.  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  No other malignancy was noted outside her liver. MRI of the brain on Jun 03, 2018 did not reveal metastatic disease.  Although hospice was previously discussed, patient wishes to continue aggressive treatment.  Proceed with gemcitabine and carboplatinum on days 1 and 8 with a 15 off provided her blood counts are adequate.  Proceed with cycle 1, day 8 of gemcitabine and carboplatin today.  Return to clinic in 2 weeks for further evaluation and consideration of cycle 2, day 1.    2.  Anemia: Hemoglobin is trended down to 9.8, monitor. 3.  Peripheral neuropathy: Chronic  and unchanged.   4.  Back pain: MRI results from May 04, 2017 did not reveal metastatic disease.  Continue symptomatic treatment.  5.  Left IJ clot: Ultrasound results reviewed independently.  Patient is no longer taking Eliquis. 6.  Neutropenia: Secondary to chemotherapy.  Proceed cautiously with treatment as above. 7.  Memory complaints/confusion: Patient does not complain of this today.  Repeat MRI of the brain on Jun 03, 2018 reviewed independently with no obvious intracranial abnormality.  8.  Elevated liver enzymes: Chronic and unchanged.  AST remains mildly elevated at 81. 9.  Insomnia: Patient does not complain of this today.   Continue Ambien as prescribed.   10.  Weakness and fatigue: Slightly worse today.  Patient was previously given a referral to the [facility] program. 11.  Weight gain: Monitor.  Kidney function remains within normal limits.  Patient expressed understanding and was in agreement with this plan. She also understands that She can call clinic at any time with any questions, concerns, or complaints.   Breast cancer   Staging form: Breast, AJCC 7th Edition     Pathologic stage from 08/11/2014: Stage IIIA (T0, N2a, cM0) - Signed by Lloyd Huger, MD on 08/11/2014   Lloyd Huger, MD   02/26/2019 6:46 AM

## 2019-02-24 ENCOUNTER — Other Ambulatory Visit: Payer: Self-pay

## 2019-02-24 ENCOUNTER — Encounter: Payer: Self-pay | Admitting: Oncology

## 2019-02-24 NOTE — Progress Notes (Signed)
Patient prescreened for appointment. Patient has no concerns or questions.  

## 2019-02-25 ENCOUNTER — Inpatient Hospital Stay: Payer: Medicare Other

## 2019-02-25 ENCOUNTER — Inpatient Hospital Stay (HOSPITAL_BASED_OUTPATIENT_CLINIC_OR_DEPARTMENT_OTHER): Payer: Medicare Other | Admitting: Oncology

## 2019-02-25 ENCOUNTER — Other Ambulatory Visit: Payer: Self-pay

## 2019-02-25 VITALS — BP 123/79 | HR 105 | Temp 96.3°F | Resp 18 | Wt 166.0 lb

## 2019-02-25 DIAGNOSIS — C50919 Malignant neoplasm of unspecified site of unspecified female breast: Secondary | ICD-10-CM | POA: Diagnosis not present

## 2019-02-25 DIAGNOSIS — Z170 Estrogen receptor positive status [ER+]: Secondary | ICD-10-CM | POA: Diagnosis not present

## 2019-02-25 DIAGNOSIS — C787 Secondary malignant neoplasm of liver and intrahepatic bile duct: Secondary | ICD-10-CM | POA: Diagnosis not present

## 2019-02-25 DIAGNOSIS — Z5111 Encounter for antineoplastic chemotherapy: Secondary | ICD-10-CM | POA: Diagnosis not present

## 2019-02-25 DIAGNOSIS — C7951 Secondary malignant neoplasm of bone: Secondary | ICD-10-CM | POA: Diagnosis not present

## 2019-02-25 DIAGNOSIS — Z66 Do not resuscitate: Secondary | ICD-10-CM | POA: Diagnosis not present

## 2019-02-25 LAB — COMPREHENSIVE METABOLIC PANEL
ALT: 31 U/L (ref 0–44)
AST: 81 U/L — ABNORMAL HIGH (ref 15–41)
Albumin: 2.5 g/dL — ABNORMAL LOW (ref 3.5–5.0)
Alkaline Phosphatase: 190 U/L — ABNORMAL HIGH (ref 38–126)
Anion gap: 10 (ref 5–15)
BUN: 14 mg/dL (ref 8–23)
CO2: 21 mmol/L — ABNORMAL LOW (ref 22–32)
Calcium: 8.2 mg/dL — ABNORMAL LOW (ref 8.9–10.3)
Chloride: 105 mmol/L (ref 98–111)
Creatinine, Ser: 0.72 mg/dL (ref 0.44–1.00)
GFR calc Af Amer: >60 mL/min (ref >60)
GFR calc non Af Amer: >60 mL/min (ref >60)
Glucose, Bld: 143 mg/dL — ABNORMAL HIGH (ref 70–99)
Potassium: 4 mmol/L (ref 3.5–5.1)
Sodium: 136 mmol/L (ref 135–145)
Total Bilirubin: 0.7 mg/dL (ref 0.3–1.2)
Total Protein: 5.3 g/dL — ABNORMAL LOW (ref 6.5–8.1)

## 2019-02-25 LAB — CBC WITH DIFFERENTIAL/PLATELET
Abs Immature Granulocytes: 0.14 10*3/uL — ABNORMAL HIGH (ref 0.00–0.07)
Basophils Absolute: 0 10*3/uL (ref 0.0–0.1)
Basophils Relative: 1 %
Eosinophils Absolute: 0.1 10*3/uL (ref 0.0–0.5)
Eosinophils Relative: 3 %
HCT: 32.1 % — ABNORMAL LOW (ref 36.0–46.0)
Hemoglobin: 9.8 g/dL — ABNORMAL LOW (ref 12.0–15.0)
Immature Granulocytes: 5 %
Lymphocytes Relative: 20 %
Lymphs Abs: 0.6 10*3/uL — ABNORMAL LOW (ref 0.7–4.0)
MCH: 31.6 pg (ref 26.0–34.0)
MCHC: 30.5 g/dL (ref 30.0–36.0)
MCV: 103.5 fL — ABNORMAL HIGH (ref 80.0–100.0)
Monocytes Absolute: 0.7 10*3/uL (ref 0.1–1.0)
Monocytes Relative: 22 %
Neutro Abs: 1.4 10*3/uL — ABNORMAL LOW (ref 1.7–7.7)
Neutrophils Relative %: 49 %
Platelets: 131 10*3/uL — ABNORMAL LOW (ref 150–400)
RBC: 3.1 MIL/uL — ABNORMAL LOW (ref 3.87–5.11)
RDW: 16.7 % — ABNORMAL HIGH (ref 11.5–15.5)
WBC: 2.9 10*3/uL — ABNORMAL LOW (ref 4.0–10.5)
nRBC: 0 % (ref 0.0–0.2)

## 2019-02-25 MED ORDER — PALONOSETRON HCL INJECTION 0.25 MG/5ML
0.2500 mg | Freq: Once | INTRAVENOUS | Status: AC
  Administered 2019-02-25: 11:00:00 0.25 mg via INTRAVENOUS
  Filled 2019-02-25: qty 5

## 2019-02-25 MED ORDER — SODIUM CHLORIDE 0.9 % IV SOLN
166.6000 mg | Freq: Once | INTRAVENOUS | Status: AC
  Administered 2019-02-25: 13:00:00 170 mg via INTRAVENOUS
  Filled 2019-02-25: qty 17

## 2019-02-25 MED ORDER — SODIUM CHLORIDE 0.9% FLUSH
10.0000 mL | Freq: Once | INTRAVENOUS | Status: AC
  Administered 2019-02-25: 09:00:00 10 mL via INTRAVENOUS
  Filled 2019-02-25: qty 10

## 2019-02-25 MED ORDER — SODIUM CHLORIDE 0.9 % IV SOLN
10.0000 mg | Freq: Once | INTRAVENOUS | Status: AC
  Administered 2019-02-25: 12:00:00 10 mg via INTRAVENOUS
  Filled 2019-02-25: qty 1

## 2019-02-25 MED ORDER — HEPARIN SOD (PORK) LOCK FLUSH 100 UNIT/ML IV SOLN
500.0000 [IU] | Freq: Once | INTRAVENOUS | Status: AC | PRN
  Administered 2019-02-25: 13:00:00 500 [IU]
  Filled 2019-02-25: qty 5

## 2019-02-25 MED ORDER — SODIUM CHLORIDE 0.9 % IV SOLN
Freq: Once | INTRAVENOUS | Status: AC
  Filled 2019-02-25: qty 250

## 2019-02-25 MED ORDER — SODIUM CHLORIDE 0.9 % IV SOLN
1600.0000 mg | Freq: Once | INTRAVENOUS | Status: AC
  Administered 2019-02-25: 12:00:00 1600 mg via INTRAVENOUS
  Filled 2019-02-25: qty 15.78

## 2019-02-25 NOTE — Progress Notes (Signed)
Pt called for pre assessment yesterday by RN, pt states no changes today.

## 2019-02-25 NOTE — Progress Notes (Signed)
WBC 2.9, ANC 1.4, Calcium 8.2, AST 81, HR 105 Per Dr. Grayland Ormond okay to proceed with treatment.   No blood return noted from port. Port flushes without difficulty, and no redness or swelling noted at site, pt denies pain at port site. Dr. Grayland Ormond aware, Per Dr. Grayland Ormond proceed with treatment as scheduled, okay to use port a cath. Chest CT was performed on 01/31/19.

## 2019-03-04 ENCOUNTER — Telehealth: Payer: Self-pay | Admitting: Emergency Medicine

## 2019-03-04 NOTE — Telephone Encounter (Signed)
Amanda [name]- could you please get patient scheduled for today or tomorrow please? Thanks!

## 2019-03-04 NOTE — Telephone Encounter (Signed)
Pt called reporting that she is retaining fluid and that her legs have doubled in size since she last was seen by Dr. Grayland Ormond. Asked what she should do. Please advise.

## 2019-03-04 NOTE — Telephone Encounter (Signed)
Patient accepts 11 AM appointment with Lorretta Harp, NP

## 2019-03-04 NOTE — Telephone Encounter (Signed)
[facility] please 

## 2019-03-05 ENCOUNTER — Ambulatory Visit
Admission: RE | Admit: 2019-03-05 | Discharge: 2019-03-05 | Disposition: A | Discharge status: Home / Self Care | Payer: Medicare Other | Attending: Oncology | Admitting: Oncology

## 2019-03-05 ENCOUNTER — Ambulatory Visit
Admission: RE | Admit: 2019-03-05 | Discharge: 2019-03-05 | Disposition: A | Discharge status: Home / Self Care | Payer: Medicare Other | Source: Ambulatory Visit | Attending: Oncology | Admitting: Oncology

## 2019-03-05 ENCOUNTER — Other Ambulatory Visit: Payer: Self-pay

## 2019-03-05 ENCOUNTER — Inpatient Hospital Stay (HOSPITAL_BASED_OUTPATIENT_CLINIC_OR_DEPARTMENT_OTHER): Payer: Medicare Other | Admitting: Oncology

## 2019-03-05 ENCOUNTER — Encounter: Payer: Self-pay | Admitting: Oncology

## 2019-03-05 ENCOUNTER — Inpatient Hospital Stay: Payer: Medicare Other

## 2019-03-05 VITALS — BP 122/85 | HR 121 | Temp 96.4°F | Resp 18 | Wt 170.0 lb

## 2019-03-05 DIAGNOSIS — R601 Generalized edema: Secondary | ICD-10-CM

## 2019-03-05 DIAGNOSIS — R05 Cough: Secondary | ICD-10-CM | POA: Diagnosis not present

## 2019-03-05 DIAGNOSIS — Z66 Do not resuscitate: Secondary | ICD-10-CM | POA: Diagnosis not present

## 2019-03-05 DIAGNOSIS — C50919 Malignant neoplasm of unspecified site of unspecified female breast: Secondary | ICD-10-CM | POA: Diagnosis not present

## 2019-03-05 DIAGNOSIS — Z170 Estrogen receptor positive status [ER+]: Secondary | ICD-10-CM | POA: Diagnosis not present

## 2019-03-05 DIAGNOSIS — R140 Abdominal distension (gaseous): Secondary | ICD-10-CM | POA: Diagnosis not present

## 2019-03-05 DIAGNOSIS — C787 Secondary malignant neoplasm of liver and intrahepatic bile duct: Secondary | ICD-10-CM | POA: Diagnosis not present

## 2019-03-05 DIAGNOSIS — Z5111 Encounter for antineoplastic chemotherapy: Secondary | ICD-10-CM | POA: Diagnosis not present

## 2019-03-05 DIAGNOSIS — C7951 Secondary malignant neoplasm of bone: Secondary | ICD-10-CM | POA: Diagnosis not present

## 2019-03-05 LAB — COMPREHENSIVE METABOLIC PANEL
ALT: 26 U/L (ref 0–44)
AST: 58 U/L — ABNORMAL HIGH (ref 15–41)
Albumin: 2.6 g/dL — ABNORMAL LOW (ref 3.5–5.0)
Alkaline Phosphatase: 175 U/L — ABNORMAL HIGH (ref 38–126)
Anion gap: 10 (ref 5–15)
BUN: 18 mg/dL (ref 8–23)
CO2: 22 mmol/L (ref 22–32)
Calcium: 8.4 mg/dL — ABNORMAL LOW (ref 8.9–10.3)
Chloride: 104 mmol/L (ref 98–111)
Creatinine, Ser: 0.74 mg/dL (ref 0.44–1.00)
GFR calc Af Amer: >60 mL/min (ref >60)
GFR calc non Af Amer: >60 mL/min (ref >60)
Glucose, Bld: 173 mg/dL — ABNORMAL HIGH (ref 70–99)
Potassium: 4.5 mmol/L (ref 3.5–5.1)
Sodium: 136 mmol/L (ref 135–145)
Total Bilirubin: 0.8 mg/dL (ref 0.3–1.2)
Total Protein: 5.4 g/dL — ABNORMAL LOW (ref 6.5–8.1)

## 2019-03-05 LAB — CBC WITH DIFFERENTIAL/PLATELET
Abs Immature Granulocytes: 0.27 10*3/uL — ABNORMAL HIGH (ref 0.00–0.07)
Basophils Absolute: 0 10*3/uL (ref 0.0–0.1)
Basophils Relative: 0 %
Eosinophils Absolute: 0.1 10*3/uL (ref 0.0–0.5)
Eosinophils Relative: 1 %
HCT: 27.6 % — ABNORMAL LOW (ref 36.0–46.0)
Hemoglobin: 8.5 g/dL — ABNORMAL LOW (ref 12.0–15.0)
Immature Granulocytes: 6 %
Lymphocytes Relative: 11 %
Lymphs Abs: 0.6 10*3/uL — ABNORMAL LOW (ref 0.7–4.0)
MCH: 32.3 pg (ref 26.0–34.0)
MCHC: 30.8 g/dL (ref 30.0–36.0)
MCV: 104.9 fL — ABNORMAL HIGH (ref 80.0–100.0)
Monocytes Absolute: 1 10*3/uL (ref 0.1–1.0)
Monocytes Relative: 21 %
Neutro Abs: 2.9 10*3/uL (ref 1.7–7.7)
Neutrophils Relative %: 61 %
Platelets: 30 10*3/uL — ABNORMAL LOW (ref 150–400)
RBC: 2.63 MIL/uL — ABNORMAL LOW (ref 3.87–5.11)
RDW: 17.2 % — ABNORMAL HIGH (ref 11.5–15.5)
WBC: 4.8 10*3/uL (ref 4.0–10.5)
nRBC: 2.9 % — ABNORMAL HIGH (ref 0.0–0.2)

## 2019-03-05 LAB — BRAIN NATRIURETIC PEPTIDE: B Natriuretic Peptide: 61 pg/mL (ref 0.0–100.0)

## 2019-03-05 MED ORDER — FUROSEMIDE 20 MG PO TABS: 20.0000 mg | ORAL_TABLET | Freq: Two times a day (BID) | ORAL | 0 refills | Status: DC

## 2019-03-05 NOTE — Progress Notes (Signed)
Symptom Management Consult note [facility]  [phone] [phone]  Patient Care Team: Sofie Hartigan, MD as PCP - General (Family Medicine) Jules Husbands, MD as Consulting Physician (General Surgery)   Name of the patient: Amanda [name]  [id number]  08-31-1950   Date of visit: 03/05/2019   Diagnosis-metastatic breast  Chief complaint/ Reason for visit- BLE Swelling/weight gain  Heme/Onc history:  Oncology History  Cancer of left female breast  ([place])  07/27/2014 Initial Diagnosis   Cancer of left female breast [facility])   Metastatic breast cancer ([place])  09/24/2015 Initial Diagnosis   Metastatic breast cancer ([place])   02/27/2018 - 05/21/2018 Chemotherapy   The patient had pegfilgrastim (NEULASTA) injection 6 mg, 6 mg, Subcutaneous, Once, 1 of 1 cycle Administration: 6 mg (04/19/2018) pegfilgrastim (NEULASTA ONPRO KIT) injection 6 mg, 6 mg, Subcutaneous, Once, 1 of 2 cycles Administration: 6 mg (05/01/2018), 6 mg (05/15/2018) eriBULin mesylate (HALAVEN) 2.75 mg in sodium chloride 0.9 % 100 mL chemo infusion, 1.4 mg/m2 = 2.75 mg, Intravenous,  Once, 3 of 4 cycles Dose modification: 1.1 mg/m2 (original dose 1.4 mg/m2, Cycle 1, Reason: Dose not tolerated) Administration: 2.75 mg (02/27/2018), 2.15 mg (03/20/2018), 2.15 mg (04/04/2018), 2.15 mg (04/17/2018), 2.15 mg (05/01/2018), 2.15 mg (05/15/2018)  for chemotherapy treatment.    06/04/2018 - 02/17/2019 Chemotherapy   The patient had gemcitabine (GEMZAR) 1,400 mg in sodium chloride 0.9 % 250 mL chemo infusion, 1,406 mg, Intravenous,  Once, 18 of 22 cycles Administration: 1,400 mg (06/04/2018), 1,400 mg (06/18/2018), 1,400 mg (07/02/2018), 1,400 mg (07/16/2018), 1,400 mg (08/08/2018), 1,400 mg (08/20/2018), 1,400 mg (09/03/2018), 1,400 mg (09/17/2018), 1,400 mg (10/01/2018), 1,400 mg (10/15/2018), 1,400 mg (10/29/2018), 1,400 mg (11/12/2018), 1,400 mg (11/26/2018), 1,400 mg (12/10/2018), 1,400 mg (12/24/2018), 1,400 mg  (01/07/2019), 1,400 mg (01/21/2019), 1,400 mg (02/04/2019)  for chemotherapy treatment.    02/18/2019 -  Chemotherapy   The patient had palonosetron (ALOXI) injection 0.25 mg, 0.25 mg, Intravenous,  Once, 1 of 4 cycles Administration: 0.25 mg (02/18/2019), 0.25 mg (02/25/2019) CARBOplatin (PARAPLATIN) 170 mg in sodium chloride 0.9 % 100 mL chemo infusion, 170 mg (100 % of original dose 166.6 mg), Intravenous,  Once, 1 of 4 cycles Dose modification:   (original dose 166.6 mg, Cycle 1) Administration: 170 mg (02/18/2019), 170 mg (02/25/2019) gemcitabine (GEMZAR) 1,600 mg in sodium chloride 0.9 % 250 mL chemo infusion, 1,444 mg (100 % of original dose 900 mg/m2), Intravenous,  Once, 1 of 4 cycles Dose modification: 800 mg/m2 (original dose 900 mg/m2, Cycle 1, Reason: Dose not tolerated), 1,000 mg/m2 (original dose 900 mg/m2, Cycle 1, Reason: Provider Judgment), 900 mg/m2 (original dose 900 mg/m2, Cycle 1, Reason: Provider Judgment) Administration: 1,600 mg (02/18/2019), 1,600 mg (02/25/2019)  for chemotherapy treatment.     Interval history-patient presents to symptom management today for complaints of bilateral lower extremity and abdominal bloating.  Husband states symptoms have been present since she started her new chemo which was approximately 3 weeks ago.  She was recently switched from single agent Gemzar to carbo/Gemzar D1D8 q 3 weeks on 02/18/2019 and completed cycle 1 on 02/25/2019.  She scheduled for cycle 2 next week. Per husband, she has been going "downhill" since treatment.  Patient is incontinent at home and is dependent on him for repositioning and moving around the house. She can take a few steps with the use of a walker. She is weak and fatigued and sleeps most of the day.  Her appetite is stable.  Her weight is up 21 pounds since 02/18/19.  She has an occasional cough often times while laying down.  Cough is not productive.  She has nausea without vomiting.  She takes Zofran 4 times daily. She denies any  fevers or recent illness.  She denies any sick contacts.  She is followed by palliative medicine outpatient.  They have reviewed advance care planning: She is a DNR but she wishes to continue chemo and treat what is treatable.  ECOG FS:3 - Symptomatic, >50% confined to bed  Review of systems- Review of Systems  Constitutional: Positive for malaise/fatigue. Negative for chills, fever and weight loss.       21lb weight gain  HENT: Negative for congestion, ear pain and tinnitus.   Eyes: Negative.  Negative for blurred vision and double vision.  Respiratory: Positive for cough. Negative for sputum production and shortness of breath.   Cardiovascular: Negative.  Negative for chest pain, palpitations and leg swelling.  Gastrointestinal: Negative.  Negative for abdominal pain, constipation, diarrhea, nausea and vomiting.  Genitourinary: Negative for dysuria, frequency and urgency.  Musculoskeletal: Negative for back pain and falls.  Skin: Negative.  Negative for rash.  Neurological: Positive for weakness. Negative for headaches.  Endo/Heme/Allergies: Negative.  Does not bruise/bleed easily.  Psychiatric/Behavioral: Positive for depression. The patient is not nervous/anxious and does not have insomnia.      Current treatment- S/p cycle 1 Gemzar/carbo.  Last given on 02/18/2019 and 02/25/2019.  Allergies  Allergen Reactions  . Ace Inhibitors Cough  . Latex Itching  . Morphine And Related Itching    Caused her to itch terribly. Would prefer if given to take with a benadryl  . Penicillins Rash    Has patient had a PCN reaction causing immediate rash, facial/tongue/throat swelling, SOB or lightheadedness with hypotension: No Has patient had a PCN reaction causing severe rash involving mucus membranes or skin necrosis: No Has patient had a PCN reaction that required hospitalization No Has patient had a PCN reaction occurring within the last 10 years: No If all of the above answers are "NO", then  may proceed with Cephalosporin use.     Past Medical History:  Diagnosis Date  . Anxiety   . Back pain    occasionally  . Breast cancer, left ([place]) 2009  . Depression    takes Paxil and Wellbutrin daily  . Genetic testing 02/03/2017   Multi-Cancer panel (83 genes) @ [facility] - No pathogenic mutations detected  . GERD (gastroesophageal reflux disease)    occasional  . History of bronchitis 2 yrs ago  . History of kidney stones   . Hyperlipidemia    takes Atorvastatin daily  . Insomnia    takes Ambien nightly  . Insomnia    takes gabapentin nightly  . Joint pain   . Mood swings   . Osteoarthritis of knee   . Personal history of chemotherapy 12/05/2016   Mets from Breast Cancer  . Personal history of radiation therapy 11/2016  . Pneumonia   . PONV (postoperative nausea and vomiting)   . Seasonal allergies    takes Allegra daily     Past Surgical History:  Procedure Laterality Date  . BREAST BIOPSY  2009  . cataract surgery Bilateral   . COLONOSCOPY    . IR RADIOLOGIST EVAL & MGMT  01/29/2018  . JOINT REPLACEMENT Left 2014   knee  . KNEE ARTHROSCOPY Left    x 5  . MASTECTOMY Left   . port a cath  placed    . PORTACATH PLACEMENT N/A 09/17/2015   Procedure: INSERTION PORT-A-CATH;  Surgeon: Jules Husbands, MD;  Location: [facility] ORS;  Service: General;  Laterality: N/A;  . RADIOLOGY WITH ANESTHESIA N/A 02/20/2018   Procedure: CT WITH ANESTHESIA MICROWAVE THERMAL ABLATION-LIVER;  Surgeon: Jacqulynn Cadet, MD;  Location: [facility];  Service: Anesthesiology;  Laterality: N/A;  . TOOTH EXTRACTION    . TOTAL SHOULDER ARTHROPLASTY Right 06/03/2015   Procedure: TOTAL SHOULDER ARTHROPLASTY;  Surgeon: Tania Ade, MD;  Location: [facility];  Service: Orthopedics;  Laterality: Right;  Right total shoulder arthroplasty  . TOTAL SHOULDER REPLACEMENT Right 06/03/2015  . TUBAL LIGATION      Social History   Socioeconomic History  . Marital status: Married    Spouse name: Not on file  .  Number of children: Not on file  . Years of education: Not on file  . Highest education level: Not on file  Occupational History  . Not on file  Tobacco Use  . Smoking status: Never Smoker  . Smokeless tobacco: Never Used  Substance and Sexual Activity  . Alcohol use: Yes    Alcohol/week: 0.0 standard drinks    Comment: occasionally wine  . Drug use: Yes    Types: Marijuana    Comment: cannabis with no extra  low dose edibles  for neuropathy  . Sexual activity: Yes    Birth control/protection: Post-menopausal  Other Topics Concern  . Not on file  Social History Narrative  . Not on file   Social Determinants of Health   Financial Resource Strain:   . Difficulty of Paying Living Expenses: Not on file  Food Insecurity:   . Worried About Charity fundraiser in the Last Year: Not on file  . Ran Out of Food in the Last Year: Not on file  Transportation Needs:   . Lack of Transportation (Medical): Not on file  . Lack of Transportation (Non-Medical): Not on file  Physical Activity:   . Days of Exercise per Week: Not on file  . Minutes of Exercise per Session: Not on file  Stress:   . Feeling of Stress : Not on file  Social Connections:   . Frequency of Communication with Friends and Family: Not on file  . Frequency of Social Gatherings with Friends and Family: Not on file  . Attends Religious Services: Not on file  . Active Member of Clubs or Organizations: Not on file  . Attends Archivist Meetings: Not on file  . Marital Status: Not on file  Intimate Partner Violence:   . Fear of Current or Ex-Partner: Not on file  . Emotionally Abused: Not on file  . Physically Abused: Not on file  . Sexually Abused: Not on file    Family History  Problem Relation Age of Onset  . Atrial fibrillation Mother        deceased 87; TAH/BSO  . Non-Hodgkin's lymphoma Father        deceased 15  . Heart attack Maternal Uncle   . Melanoma Maternal Uncle   . Breast cancer Paternal  Aunt        dx in 45s  . Breast cancer Paternal Aunt        dx in 60s  . Cancer Brother 77       unk. type  . Breast cancer Other        Mat grandmother's 2 sisters; dx at 64 and 66  . Stomach cancer Paternal Grandfather 77  deceased 15s  . Breast cancer Cousin        daughter of pat aunt with breast ca     Current Outpatient Medications:  .  pantoprazole (PROTONIX) 40 MG tablet, Take 40 mg by mouth at bedtime as needed. , Disp: , Rfl:  .  PARoxetine (PAXIL) 40 MG tablet, Take 40 mg by mouth at bedtime. , Disp: , Rfl:  .  prochlorperazine (COMPAZINE) 10 MG tablet, TAKE 1 TABLET BY MOUTH EVERY 6 HOURS AS NEEDED FOR  NAUSEA OR  VOMITING, Disp: 120 tablet, Rfl: 1 .  acetaminophen (TYLENOL) 500 MG tablet, Take 500 mg by mouth every 6 (six) hours as needed for mild pain or moderate pain. , Disp: , Rfl:  .  atorvastatin (LIPITOR) 10 MG tablet, Take 10 mg by mouth at bedtime. , Disp: , Rfl:  .  buPROPion (WELLBUTRIN XL) 150 MG 24 hr tablet, Take 1 tablet (150 mg total) by mouth at bedtime. (Patient not taking: Reported on 03/05/2019), Disp: 90 tablet, Rfl: 0 .  Calcium-Magnesium-Vitamin D (CALCIUM 1200+D3 PO), Take 1 tablet by mouth daily. , Disp: , Rfl:  .  ergocalciferol (VITAMIN D2) 1.25 MG (50000 UT) capsule, Vitamin D2 1,250 mcg (50,000 unit) capsule  TAKE 1 CAPSULE (50,000 UNITS TOTAL) BY MOUTH EVERY 7 (SEVEN) DAYS., Disp: , Rfl:  .  fexofenadine (ALLEGRA) 180 MG tablet, Take 180 mg by mouth at bedtime. , Disp: , Rfl:  .  furosemide (LASIX) 20 MG tablet, Take 1 tablet (20 mg total) by mouth 2 (two) times daily., Disp: 30 tablet, Rfl: 0 .  lidocaine-prilocaine (EMLA) cream, Apply 1 application topically as needed. Apply to port 1-2 hours prior to chemotherapy appointment. Cover with plastic wrap. (Patient not taking: Reported on 02/18/2019), Disp: 30 g, Rfl: 0 .  Oxycodone HCl 10 MG TABS, Take 1 tablet (10 mg total) by mouth every 4 (four) hours as needed. Ok to take every 4-6 hours as  needed. (Patient not taking: Reported on 03/05/2019), Disp: 90 tablet, Rfl: 0 .  sodium chloride (MURO 128) 5 % ophthalmic solution, Place 1 drop into both eyes 4 (four) times daily., Disp: , Rfl:  .  zolpidem (AMBIEN) 10 MG tablet, Take 1 tablet (10 mg total) by mouth at bedtime as needed (sleep). (Patient not taking: Reported on 02/18/2019), Disp: 90 tablet, Rfl: 0  Physical exam:  Vitals:   03/05/19 1116 03/05/19 1124  BP:  122/85  Pulse:  (!) 121  Resp:  18  Temp: (!) 96.4 F (35.8 C)   TempSrc: Tympanic Tympanic  SpO2:  97%  Weight: 170 lb (77.1 kg)    Physical Exam Constitutional:      Appearance: Normal appearance. She is ill-appearing.  HENT:     Head: Normocephalic and atraumatic.  Eyes:     Pupils: Pupils are equal, round, and reactive to light.  Cardiovascular:     Rate and Rhythm: Normal rate and regular rhythm.     Heart sounds: Normal heart sounds. No murmur.  Pulmonary:     Effort: Pulmonary effort is normal.     Breath sounds: Examination of the left-lower field reveals decreased breath sounds. Decreased breath sounds present. No wheezing.  Abdominal:     General: Bowel sounds are decreased. There is distension.     Palpations: Abdomen is soft.     Tenderness: There is no abdominal tenderness.  Musculoskeletal:        General: Normal range of motion.     Cervical back: Normal  range of motion.     Right lower leg: Edema present.     Left lower leg: Edema present.  Skin:    General: Skin is warm and dry.     Coloration: Skin is pale.     Findings: No rash.  Neurological:     Mental Status: She is alert and oriented to person, place, and time.  Psychiatric:        Judgment: Judgment normal.     CMP Latest Ref Rng & Units 03/05/2019  Glucose 70 - 99 mg/dL 173(H)  BUN 8 - 23 mg/dL 18  Creatinine 0.44 - 1.00 mg/dL 0.74  Sodium 135 - 145 mmol/L 136  Potassium 3.5 - 5.1 mmol/L 4.5  Chloride 98 - 111 mmol/L 104  CO2 22 - 32 mmol/L 22  Calcium 8.9 - 10.3  mg/dL 8.4(L)  Total Protein 6.5 - 8.1 g/dL 5.4(L)  Total Bilirubin 0.3 - 1.2 mg/dL 0.8  Alkaline Phos 38 - 126 U/L 175(H)  AST 15 - 41 U/L 58(H)  ALT 0 - 44 U/L 26   CBC Latest Ref Rng & Units 03/05/2019  WBC 4.0 - 10.5 K/uL 4.8  Hemoglobin 12.0 - 15.0 g/dL 8.5(L)  Hematocrit 36.0 - 46.0 % 27.6(L)  Platelets 150 - 400 K/uL 30(L)    No images are attached to the encounter.  DG Chest 2 View  Result Date: 03/05/2019 CLINICAL DATA:  Cough, weakness, generalized edema EXAM: CHEST - 2 VIEW COMPARISON:  03/03/2018 FINDINGS: Right Port-A-Cath remains in place, unchanged. Heart is normal size. Scarring or atelectasis at the left base. Right lung clear. No effusions or acute bony abnormality. Prior right shoulder replacement. IMPRESSION: Left base scarring or atelectasis. Electronically Signed   By: Rolm Baptise M.D.   On: 03/05/2019 13:15   DG Abd 2 Views  Result Date: 03/05/2019 CLINICAL DATA:  Cough, weakness, swelling in both legs. Abdominal distention. EXAM: ABDOMEN - 2 VIEW COMPARISON:  None. FINDINGS: There is normal bowel gas pattern. No free air. No organomegaly or suspicious calcification. No acute bony abnormality. Degenerative changes and scoliosis in the lumbar spine. Mottled sclerotic appearance of the pelvis compatible with known widespread osseous metastatic disease. IMPRESSION: No acute findings. Electronically Signed   By: Rolm Baptise M.D.   On: 03/05/2019 13:16    Assessment and plan- Patient is a 69 y.o. female who presents to symptom management today for worsening swelling in bilateral lower extremities, abdomen and new onset cough.  Symptoms have been present for approximately 2 weeks.   Metastatic breast cancer: Status post several lines of treatment.  Most recent treatment includes cycle 1 Gemzar/carbo.  Last given on 02/25/2019.  Most recent imaging from 01/31/2019 revealed small increase in liver lesion, moderate ascites and widespread osseous metastasis with no increase in  size or distribution.  This may reflect a response to therapy. She is scheduled for cycle 3 on 03/11/19.  Anasarca: Unclear etiology.  Differentials include CHF, nephrotic syndrome, medication induced, bilateral DVT and third spacing d/t hypoalbunemia and metastatic breast cancer with mets to liver.  Blood pressure is stable but she is tachycardic with a heart rate of 121 likely due to fluid overload.  I recommended she be admitted to the hospital to be diuresed and possible paracentesis as noted on previous imaging.  Her husband states he is able to take her wherever she needs to go today to stay out of the hospital.  I spoke with Dr. Grayland Ormond who agreed with ED evaluation but if they  were not agreeable we could perform labs and imaging outpatient today.  Mrs. Stark Jock and her husband know that if she gets any worse or develops increase shortness of breath or chest pain she needs to report directly to the emergency department.  I further explained that her vital signs are not stable and she likely will need a cardiology evaluation which would likely not happen until next week if she remained outpatient.   Ms. Stark Jock does not have a history of heart failure but will get a BNP given his significant weight gain.  We will also get lab work to check her kidney function and liver function.  No culprit medications identified.  Unlikely bilateral DVTs given edema is is generalized and she denies pain.   Plan: Labs (cbc, cmp, bnp). Baseline.  (Albumin 2.6,AST 58,hemoglobin 8.5 and platelets 35,000) UA-unable to collect-incontient Stat Chest and abdominal X-ray. No acute abnormalities. Rx 59m lasix po BID Echocardiogram-03/25/19 BNP-normal  Disposition: Chest and abdominal today.  Schedule ECHO. Will call with results and appts. RTC as scheduled for cycle 2.  Visit Diagnosis 1. Generalized edema     Patient expressed understanding and was in agreement with this plan. She also understands that She can  call clinic at any time with any questions, concerns, or complaints.   Greater than 50% was spent in counseling and coordination of care with this patient including but not limited to discussion of the relevant topics above (See A&P) including, but not limited to diagnosis and management of acute and chronic medical conditions.   Thank you for allowing me to participate in the care of this very pleasant patient.    JJacquelin Hawking NP CPromptonat AAlvarado Hospital Medical CenterCell - [phone]- [phone]/17/2021 3:40 PM

## 2019-03-06 MED ORDER — FUROSEMIDE 20 MG PO TABS: 20.0000 mg | ORAL_TABLET | Freq: Two times a day (BID) | ORAL | 0 refills | Status: DC

## 2019-03-06 NOTE — Progress Notes (Signed)
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  INTERVAL HISTORY: Patient returns to clinic for further evaluation and consideration of cycle 2, day 1 of carboplatinum and gemcitabine.  She has increased anasarca.  She also has increased weakness and fatigue. She denies any pain. She has chronic peripheral neuropathy, but no other neurologic complaints. She denies any recent fevers or illnesses.  She denies any chest pain, shortness of breath, cough, or hemoptysis.  She denies any vomiting, constipation, or diarrhea.  She has no urinary complaints.  Patient offers no further specific complaints today.  REVIEW OF SYSTEMS:   Review of Systems  Constitutional: Positive for malaise/fatigue. Negative for fever and weight loss.  Eyes: Negative.  Negative for blurred vision, double vision and pain.  Respiratory: Negative.  Negative for cough and shortness of breath.   Cardiovascular: Positive for leg swelling. Negative for chest pain.  Gastrointestinal: Positive for nausea. Negative for abdominal pain.  Genitourinary: Negative.  Negative for dysuria and flank pain.  Musculoskeletal: Negative for back pain and falls.  Skin: Negative.  Negative for rash.  Neurological: Positive for tingling, sensory change and weakness. Negative for focal weakness and headaches.  Endo/Heme/Allergies: Negative.   Psychiatric/Behavioral: Negative.  Negative for memory loss. The patient is not nervous/anxious and does not have insomnia.     As per HPI. Otherwise, a complete review of systems is negative.  PAST MEDICAL HISTORY: Past Medical History:  Diagnosis Date  . Anxiety     . Back pain    occasionally  . Breast cancer, left ([place]) 2009  . Depression    takes Paxil and Wellbutrin daily  . Genetic testing 02/03/2017   Multi-Cancer panel (83 genes) @ [facility] - No pathogenic mutations detected  . GERD (gastroesophageal reflux disease)    occasional  . History of bronchitis 2 yrs ago  . History of kidney stones   . Hyperlipidemia    takes Atorvastatin daily  . Insomnia    takes Ambien nightly  . Insomnia    takes gabapentin nightly  . Joint pain   . Mood swings   . Osteoarthritis of knee   . Personal history of chemotherapy 12/05/2016   Mets from Breast Cancer  . Personal history of radiation therapy 11/2016  . Pneumonia   . PONV (postoperative nausea and vomiting)   . Seasonal allergies    takes Allegra daily    PAST SURGICAL HISTORY: Past Surgical History:  Procedure Laterality Date  . BREAST BIOPSY  2009  . cataract surgery Bilateral   . COLONOSCOPY    . IR RADIOLOGIST EVAL & MGMT  01/29/2018  . JOINT REPLACEMENT Left 2014   knee  . KNEE ARTHROSCOPY Left    x 5  . MASTECTOMY Left   . port a cath placed    . PORTACATH PLACEMENT N/A 09/17/2015   Procedure: INSERTION PORT-A-CATH;  Surgeon: Jules Husbands, MD;  Location: [facility];  Service: General;  Laterality: N/A;  . RADIOLOGY WITH ANESTHESIA N/A 02/20/2018   Procedure: CT WITH ANESTHESIA MICROWAVE THERMAL ABLATION-LIVER;  Surgeon: Jacqulynn Cadet, MD;  Location: [facility];  Service: Anesthesiology;  Laterality: N/A;  . TOOTH EXTRACTION    . TOTAL SHOULDER ARTHROPLASTY Right  06/03/2015   Procedure: TOTAL SHOULDER ARTHROPLASTY;  Surgeon: Tania Ade, MD;  Location: [place];  Service: Orthopedics;  Laterality: Right;  Right total shoulder arthroplasty  . TOTAL SHOULDER REPLACEMENT Right 06/03/2015  . TUBAL LIGATION      FAMILY HISTORY: Father with non-Hodgkin's lymphoma, 2 paternal aunts with breast cancer.     ADVANCED DIRECTIVES:    HEALTH MAINTENANCE: Social History   Tobacco Use   . Smoking status: Never Smoker  . Smokeless tobacco: Never Used  Substance Use Topics  . Alcohol use: Yes    Alcohol/week: 0.0 standard drinks    Comment: occasionally wine  . Drug use: Yes    Types: Marijuana    Comment: cannabis with no extra  low dose edibles  for neuropathy     Colonoscopy:  PAP:  Bone density:  Lipid panel:  Allergies  Allergen Reactions  . Ace Inhibitors Cough  . Latex Itching  . Morphine And Related Itching    Caused her to itch terribly. Would prefer if given to take with a benadryl  . Penicillins Rash    Has patient had a PCN reaction causing immediate rash, facial/tongue/throat swelling, SOB or lightheadedness with hypotension: No Has patient had a PCN reaction causing severe rash involving mucus membranes or skin necrosis: No Has patient had a PCN reaction that required hospitalization No Has patient had a PCN reaction occurring within the last 10 years: No If all of the above answers are "NO", then may proceed with Cephalosporin use.    Current Outpatient Medications  Medication Sig Dispense Refill  . acetaminophen (TYLENOL) 500 MG tablet Take 500 mg by mouth every 6 (six) hours as needed for mild pain or moderate pain.     Marland Kitchen atorvastatin (LIPITOR) 10 MG tablet Take 10 mg by mouth at bedtime.     . Calcium-Magnesium-Vitamin D (CALCIUM 1200+D3 PO) Take 1 tablet by mouth daily.     . ergocalciferol (VITAMIN D2) 1.25 MG (50000 UT) capsule Vitamin D2 1,250 mcg (50,000 unit) capsule  TAKE 1 CAPSULE (50,000 UNITS TOTAL) BY MOUTH EVERY 7 (SEVEN) DAYS.    . fexofenadine (ALLEGRA) 180 MG tablet Take 180 mg by mouth at bedtime.     . furosemide (LASIX) 40 MG tablet Take 40 mg by mouth 2 (two) times daily.    . pantoprazole (PROTONIX) 40 MG tablet Take 40 mg by mouth at bedtime as needed.     Marland Kitchen PARoxetine (PAXIL) 40 MG tablet Take 40 mg by mouth at bedtime.     . prochlorperazine (COMPAZINE) 10 MG tablet TAKE 1 TABLET BY MOUTH EVERY 6 HOURS AS NEEDED FOR   NAUSEA OR  VOMITING 120 tablet 1  . sodium chloride (MURO 128) 5 % ophthalmic solution Place 1 drop into both eyes 4 (four) times daily.    Marland Kitchen buPROPion (WELLBUTRIN XL) 150 MG 24 hr tablet Take 1 tablet (150 mg total) by mouth at bedtime. (Patient not taking: Reported on 03/05/2019) 90 tablet 0  . furosemide (LASIX) 20 MG tablet Take 2 tablets (40 mg total) by mouth 2 (two) times daily. (Patient not taking: Reported on 03/11/2019) 60 tablet 1  . lidocaine-prilocaine (EMLA) cream Apply 1 application topically as needed. Apply to port 1-2 hours prior to chemotherapy appointment. Cover with plastic wrap. (Patient not taking: Reported on 02/18/2019) 30 g 0  . Oxycodone HCl 10 MG TABS Take 1 tablet (10 mg total) by mouth every 4 (four) hours as needed. Ok to take every 4-6 hours as  needed. (Patient not taking: Reported on 03/05/2019) 90 tablet 0  . zolpidem (AMBIEN) 10 MG tablet Take 1 tablet (10 mg total) by mouth at bedtime as needed (sleep). (Patient not taking: Reported on 02/18/2019) 90 tablet 0   No current facility-administered medications for this visit.    OBJECTIVE: Vitals:   03/11/19 0942  BP: 119/78  Pulse: (!) 111  Resp: 19  Temp: (!) 96.9 F (36.1 C)  SpO2: 98%     Body mass index is 29.87 kg/m.    ECOG FS:2 - Symptomatic, <50% confined to bed  General: Thin, no acute distress.  Sitting in a wheelchair. Eyes: Pink conjunctiva, anicteric sclera. HEENT: Normocephalic, moist mucous membranes. Lungs: No audible wheezing or coughing. Heart: Tachycardic. Abdomen: Soft, nontender, no obvious distention. Musculoskeletal: 3+ bilateral lower extremity edema. Neuro: Alert, answering all questions appropriately. Cranial nerves grossly intact. Skin: No rashes or petechiae noted. Psych: Normal affect.  LAB RESULTS:  Lab Results  Component Value Date   NA 137 03/11/2019   K 3.5 03/11/2019   CL 101 03/11/2019   CO2 24 03/11/2019   GLUCOSE 201 (H) 03/11/2019   BUN 13 03/11/2019    CREATININE 0.77 03/11/2019   CALCIUM 8.0 (L) 03/11/2019   PROT 5.0 (L) 03/11/2019   ALBUMIN 2.3 (L) 03/11/2019   AST 52 (H) 03/11/2019   ALT 20 03/11/2019   ALKPHOS 179 (H) 03/11/2019   BILITOT 0.7 03/11/2019   GFRNONAA >60 03/11/2019   GFRAA >60 03/11/2019    Lab Results  Component Value Date   WBC 3.6 (L) 03/11/2019   NEUTROABS 1.6 (L) 03/11/2019   HGB 8.8 (L) 03/11/2019   HCT 29.5 (L) 03/11/2019   MCV 105.7 (H) 03/11/2019   PLT 157 03/11/2019     STUDIES: DG Chest 2 View  Result Date: 03/05/2019 CLINICAL DATA:  Cough, weakness, generalized edema EXAM: CHEST - 2 VIEW COMPARISON:  03/03/2018 FINDINGS: Right Port-A-Cath remains in place, unchanged. Heart is normal size. Scarring or atelectasis at the left base. Right lung clear. No effusions or acute bony abnormality. Prior right shoulder replacement. IMPRESSION: Left base scarring or atelectasis. Electronically Signed   By: Rolm Baptise M.D.   On: 03/05/2019 13:15   DG Abd 2 Views  Result Date: 03/05/2019 CLINICAL DATA:  Cough, weakness, swelling in both legs. Abdominal distention. EXAM: ABDOMEN - 2 VIEW COMPARISON:  None. FINDINGS: There is normal bowel gas pattern. No free air. No organomegaly or suspicious calcification. No acute bony abnormality. Degenerative changes and scoliosis in the lumbar spine. Mottled sclerotic appearance of the pelvis compatible with known widespread osseous metastatic disease. IMPRESSION: No acute findings. Electronically Signed   By: Rolm Baptise M.D.   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  PLAN:    1.  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  No other malignancy was noted outside her liver. MRI of the brain on Jun 03, 2018 did not reveal metastatic  disease.  Although hospice was previously discussed, patient wishes to continue aggressive treatment.  Patient will receive gemcitabine and carboplatinum on days 1 and 8 with a 15 off provided her blood counts are adequate.  Proceed with cycle 2, day 1 of treatment today.  Return to clinic in 1 week for further evaluation and consideration of cycle 2,  day 8. 2.  Anemia: Hemoglobin continues to trend down is now 8.8. 3.  Peripheral neuropathy: Chronic and unchanged.   4.  Back pain: MRI results from May 04, 2017 did not reveal metastatic disease.  Continue symptomatic treatment.  5.  Left IJ clot: Ultrasound results reviewed independently.  Patient is no longer taking Eliquis. 6.  Neutropenia: Mild.  Proceed with treatment as above. 7.  Memory complaints/confusion: Patient does not complain of this today.  Repeat MRI of the brain on Jun 03, 2018 reviewed independently with no obvious intracranial abnormality.  8.  Elevated liver enzymes: Chronic and unchanged.  AST is 52 today. 9.  Insomnia: Patient does not complain of this today.  Continue Ambien as prescribed.   10.  Weakness and fatigue: Chronic and unchanged. 11.  Anasarca/weight gain: Patient is having significant fluid retention and was given a prescription of Lasix today.  She has a cardiac echo scheduled for approximately 2 weeks.  Given her decreased albumin, patient was also given a referral to dietary.  Creatinine continues to be within normal limits.  Patient expressed understanding and was in agreement with this plan. She also understands that She can call clinic at any time with any questions, concerns, or complaints.   Breast cancer   Staging form: Breast, AJCC 7th Edition     Pathologic stage from 08/11/2014: Stage IIIA (T0, N2a, cM0) - Signed by Lloyd Huger, MD on 08/11/2014   Lloyd Huger, MD   03/13/2019 6:13 AM

## 2019-03-11 ENCOUNTER — Inpatient Hospital Stay: Payer: Medicare Other

## 2019-03-11 ENCOUNTER — Other Ambulatory Visit: Payer: Self-pay

## 2019-03-11 ENCOUNTER — Encounter: Payer: Self-pay | Admitting: Oncology

## 2019-03-11 ENCOUNTER — Inpatient Hospital Stay (HOSPITAL_BASED_OUTPATIENT_CLINIC_OR_DEPARTMENT_OTHER): Payer: Medicare Other | Admitting: Oncology

## 2019-03-11 VITALS — BP 119/78 | HR 111 | Temp 96.9°F | Resp 19 | Wt 174.0 lb

## 2019-03-11 VITALS — HR 108

## 2019-03-11 DIAGNOSIS — C50919 Malignant neoplasm of unspecified site of unspecified female breast: Secondary | ICD-10-CM | POA: Diagnosis not present

## 2019-03-11 DIAGNOSIS — C7951 Secondary malignant neoplasm of bone: Secondary | ICD-10-CM | POA: Diagnosis not present

## 2019-03-11 DIAGNOSIS — Z170 Estrogen receptor positive status [ER+]: Secondary | ICD-10-CM | POA: Diagnosis not present

## 2019-03-11 DIAGNOSIS — Z66 Do not resuscitate: Secondary | ICD-10-CM | POA: Diagnosis not present

## 2019-03-11 DIAGNOSIS — Z5111 Encounter for antineoplastic chemotherapy: Secondary | ICD-10-CM | POA: Diagnosis not present

## 2019-03-11 DIAGNOSIS — C787 Secondary malignant neoplasm of liver and intrahepatic bile duct: Secondary | ICD-10-CM | POA: Diagnosis not present

## 2019-03-11 LAB — COMPREHENSIVE METABOLIC PANEL
ALT: 20 U/L (ref 0–44)
AST: 52 U/L — ABNORMAL HIGH (ref 15–41)
Albumin: 2.3 g/dL — ABNORMAL LOW (ref 3.5–5.0)
Alkaline Phosphatase: 179 U/L — ABNORMAL HIGH (ref 38–126)
Anion gap: 12 (ref 5–15)
BUN: 13 mg/dL (ref 8–23)
CO2: 24 mmol/L (ref 22–32)
Calcium: 8 mg/dL — ABNORMAL LOW (ref 8.9–10.3)
Chloride: 101 mmol/L (ref 98–111)
Creatinine, Ser: 0.77 mg/dL (ref 0.44–1.00)
GFR calc Af Amer: >60 mL/min (ref >60)
GFR calc non Af Amer: >60 mL/min (ref >60)
Glucose, Bld: 201 mg/dL — ABNORMAL HIGH (ref 70–99)
Potassium: 3.5 mmol/L (ref 3.5–5.1)
Sodium: 137 mmol/L (ref 135–145)
Total Bilirubin: 0.7 mg/dL (ref 0.3–1.2)
Total Protein: 5 g/dL — ABNORMAL LOW (ref 6.5–8.1)

## 2019-03-11 LAB — CBC WITH DIFFERENTIAL/PLATELET
Abs Immature Granulocytes: 0.06 10*3/uL (ref 0.00–0.07)
Basophils Absolute: 0 10*3/uL (ref 0.0–0.1)
Basophils Relative: 1 %
Eosinophils Absolute: 0.1 10*3/uL (ref 0.0–0.5)
Eosinophils Relative: 2 %
HCT: 29.5 % — ABNORMAL LOW (ref 36.0–46.0)
Hemoglobin: 8.8 g/dL — ABNORMAL LOW (ref 12.0–15.0)
Immature Granulocytes: 2 %
Lymphocytes Relative: 18 %
Lymphs Abs: 0.7 10*3/uL (ref 0.7–4.0)
MCH: 31.5 pg (ref 26.0–34.0)
MCHC: 29.8 g/dL — ABNORMAL LOW (ref 30.0–36.0)
MCV: 105.7 fL — ABNORMAL HIGH (ref 80.0–100.0)
Monocytes Absolute: 1.2 10*3/uL — ABNORMAL HIGH (ref 0.1–1.0)
Monocytes Relative: 34 %
Neutro Abs: 1.6 10*3/uL — ABNORMAL LOW (ref 1.7–7.7)
Neutrophils Relative %: 43 %
Platelets: 157 10*3/uL (ref 150–400)
RBC: 2.79 MIL/uL — ABNORMAL LOW (ref 3.87–5.11)
RDW: 19.2 % — ABNORMAL HIGH (ref 11.5–15.5)
WBC: 3.6 10*3/uL — ABNORMAL LOW (ref 4.0–10.5)
nRBC: 0.6 % — ABNORMAL HIGH (ref 0.0–0.2)

## 2019-03-11 MED ORDER — SODIUM CHLORIDE 0.9% FLUSH
10.0000 mL | Freq: Once | INTRAVENOUS | Status: AC
  Administered 2019-03-11: 09:00:00 10 mL via INTRAVENOUS
  Filled 2019-03-11: qty 10

## 2019-03-11 MED ORDER — SODIUM CHLORIDE 0.9 % IV SOLN
Freq: Once | INTRAVENOUS | Status: AC
  Filled 2019-03-11: qty 250

## 2019-03-11 MED ORDER — HEPARIN SOD (PORK) LOCK FLUSH 100 UNIT/ML IV SOLN
500.0000 [IU] | Freq: Once | INTRAVENOUS | Status: AC | PRN
  Administered 2019-03-11: 13:00:00 500 [IU]
  Filled 2019-03-11: qty 5

## 2019-03-11 MED ORDER — SODIUM CHLORIDE 0.9 % IV SOLN
10.0000 mg | Freq: Once | INTRAVENOUS | Status: AC
  Administered 2019-03-11: 11:00:00 10 mg via INTRAVENOUS
  Filled 2019-03-11: qty 1

## 2019-03-11 MED ORDER — PALONOSETRON HCL INJECTION 0.25 MG/5ML
0.2500 mg | Freq: Once | INTRAVENOUS | Status: AC
  Administered 2019-03-11: 11:00:00 0.25 mg via INTRAVENOUS
  Filled 2019-03-11: qty 5

## 2019-03-11 MED ORDER — HEPARIN SOD (PORK) LOCK FLUSH 100 UNIT/ML IV SOLN
INTRAVENOUS | Status: AC
  Filled 2019-03-11: qty 5

## 2019-03-11 MED ORDER — SODIUM CHLORIDE 0.9 % IV SOLN
1600.0000 mg | Freq: Once | INTRAVENOUS | Status: AC
  Administered 2019-03-11: 12:00:00 1600 mg via INTRAVENOUS
  Filled 2019-03-11: qty 26.3

## 2019-03-11 MED ORDER — FUROSEMIDE 20 MG PO TABS: 40.0000 mg | ORAL_TABLET | Freq: Two times a day (BID) | ORAL | 1 refills | Status: DC

## 2019-03-11 MED ORDER — SODIUM CHLORIDE 0.9 % IV SOLN
166.6000 mg | Freq: Once | INTRAVENOUS | Status: AC
  Administered 2019-03-11: 12:00:00 170 mg via INTRAVENOUS
  Filled 2019-03-11: qty 17

## 2019-03-11 NOTE — Progress Notes (Signed)
HR 108, ok to proceed per MD

## 2019-03-11 NOTE — Progress Notes (Signed)
Patient and husband concerned about fluid retention and dry cough spells. Patient complains of decreased appetite, constipation, and nausea.

## 2019-03-15 NOTE — Progress Notes (Signed)
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  INTERVAL HISTORY: Patient returns to clinic today for further evaluation and consideration of cycle 2, day 8 of carboplatinum and gemcitabine.  Her anasarca has slightly improved, but she still has significant increased edema.  She had worsening weakness and fatigue this past week along with nausea and a poor appetite.  She denies any pain. She has chronic peripheral neuropathy, but no other neurologic complaints. She denies any recent fevers or illnesses.  She denies any chest pain, shortness of breath, or hemoptysis.  She has a chronic cough.  She denies any vomiting, constipation, or diarrhea.  She has no urinary complaints.  Patient offers no further specific complaints today.  REVIEW OF SYSTEMS:   Review of Systems  Constitutional: Positive for malaise/fatigue. Negative for fever and weight loss.  Eyes: Negative.  Negative for blurred vision, double vision and pain.  Respiratory: Positive for cough. Negative for shortness of breath.   Cardiovascular: Positive for leg swelling. Negative for chest pain.  Gastrointestinal: Positive for nausea. Negative for abdominal pain.  Genitourinary: Negative.  Negative for dysuria and flank pain.  Musculoskeletal: Negative for back pain and falls.  Skin: Negative.  Negative for rash.  Neurological: Positive for tingling, sensory change and weakness. Negative for focal weakness and headaches.  Endo/Heme/Allergies: Negative.   Psychiatric/Behavioral: Negative.  Negative for memory loss. The patient  is not nervous/anxious and does not have insomnia.     As per HPI. Otherwise, a complete review of systems is negative.  PAST MEDICAL HISTORY: Past Medical History:  Diagnosis Date  . Anxiety   . Back pain    occasionally  . Breast cancer, left ([place]) 2009  . Depression    takes Paxil and Wellbutrin daily  . Genetic testing 02/03/2017   Multi-Cancer panel (83 genes) @ [facility] - No pathogenic mutations detected  . GERD (gastroesophageal reflux disease)    occasional  . History of bronchitis 2 yrs ago  . History of kidney stones   . Hyperlipidemia    takes Atorvastatin daily  . Insomnia    takes Ambien nightly  . Insomnia    takes gabapentin nightly  . Joint pain   . Mood swings   . Osteoarthritis of knee   . Personal history of chemotherapy 12/05/2016   Mets from Breast Cancer  . Personal history of radiation therapy 11/2016  . Pneumonia   . PONV (postoperative nausea and vomiting)   . Seasonal allergies    takes Allegra daily    PAST SURGICAL HISTORY: Past Surgical History:  Procedure Laterality Date  . BREAST BIOPSY  2009  . cataract surgery Bilateral   . COLONOSCOPY    . IR RADIOLOGIST EVAL & MGMT  01/29/2018  . JOINT REPLACEMENT Left 2014   knee  . KNEE ARTHROSCOPY Left    x 5  . MASTECTOMY Left   . port a cath placed    . PORTACATH PLACEMENT N/A 09/17/2015   Procedure: INSERTION PORT-A-CATH;  Surgeon: Jules Husbands, MD;  Location: [facility];  Service: General;  Laterality: N/A;  . RADIOLOGY WITH ANESTHESIA N/A 02/20/2018   Procedure: CT  WITH ANESTHESIA MICROWAVE THERMAL ABLATION-LIVER;  Surgeon: Jacqulynn Cadet, MD;  Location: [facility];  Service: Anesthesiology;  Laterality: N/A;  . TOOTH EXTRACTION    . TOTAL SHOULDER ARTHROPLASTY Right 06/03/2015   Procedure: TOTAL SHOULDER ARTHROPLASTY;  Surgeon: Tania Ade, MD;  Location: [place];  Service: Orthopedics;  Laterality: Right;  Right total shoulder arthroplasty  . TOTAL SHOULDER REPLACEMENT Right 06/03/2015   . TUBAL LIGATION      FAMILY HISTORY: Father with non-Hodgkin's lymphoma, 2 paternal aunts with breast cancer.     ADVANCED DIRECTIVES:    HEALTH MAINTENANCE: Social History   Tobacco Use  . Smoking status: Never Smoker  . Smokeless tobacco: Never Used  Substance Use Topics  . Alcohol use: Yes    Alcohol/week: 0.0 standard drinks    Comment: occasionally wine  . Drug use: Yes    Types: Marijuana    Comment: cannabis with no extra  low dose edibles  for neuropathy     Colonoscopy:  PAP:  Bone density:  Lipid panel:  Allergies  Allergen Reactions  . Ace Inhibitors Cough  . Latex Itching  . Morphine And Related Itching    Caused her to itch terribly. Would prefer if given to take with a benadryl  . Penicillins Rash    Has patient had a PCN reaction causing immediate rash, facial/tongue/throat swelling, SOB or lightheadedness with hypotension: No Has patient had a PCN reaction causing severe rash involving mucus membranes or skin necrosis: No Has patient had a PCN reaction that required hospitalization No Has patient had a PCN reaction occurring within the last 10 years: No If all of the above answers are "NO", then may proceed with Cephalosporin use.    Current Outpatient Medications  Medication Sig Dispense Refill  . acetaminophen (TYLENOL) 500 MG tablet Take 500 mg by mouth every 6 (six) hours as needed for mild pain or moderate pain.     Marland Kitchen atorvastatin (LIPITOR) 10 MG tablet Take 10 mg by mouth at bedtime.     Marland Kitchen buPROPion (WELLBUTRIN XL) 150 MG 24 hr tablet Take 1 tablet (150 mg total) by mouth at bedtime. 90 tablet 0  . Calcium-Magnesium-Vitamin D (CALCIUM 1200+D3 PO) Take 1 tablet by mouth daily.     . ergocalciferol (VITAMIN D2) 1.25 MG (50000 UT) capsule Vitamin D2 1,250 mcg (50,000 unit) capsule  TAKE 1 CAPSULE (50,000 UNITS TOTAL) BY MOUTH EVERY 7 (SEVEN) DAYS.    . fexofenadine (ALLEGRA) 180 MG tablet Take 180 mg by mouth at bedtime.     . furosemide  (LASIX) 20 MG tablet Take 2 tablets (40 mg total) by mouth 2 (two) times daily. 60 tablet 1  . furosemide (LASIX) 40 MG tablet Take 40 mg by mouth 2 (two) times daily.    Marland Kitchen lidocaine-prilocaine (EMLA) cream Apply 1 application topically as needed. Apply to port 1-2 hours prior to chemotherapy appointment. Cover with plastic wrap. 30 g 0  . Oxycodone HCl 10 MG TABS Take 1 tablet (10 mg total) by mouth every 4 (four) hours as needed. Ok to take every 4-6 hours as needed. 90 tablet 0  . pantoprazole (PROTONIX) 40 MG tablet Take 40 mg by mouth at bedtime as needed.     Marland Kitchen PARoxetine (PAXIL) 40 MG tablet Take 40 mg by mouth at bedtime.     . prochlorperazine (COMPAZINE) 10 MG tablet TAKE 1 TABLET BY MOUTH EVERY 6 HOURS AS NEEDED FOR  NAUSEA OR  VOMITING 120 tablet 1  . sodium chloride (  MURO 128) 5 % ophthalmic solution Place 1 drop into both eyes 4 (four) times daily.    Marland Kitchen zolpidem (AMBIEN) 10 MG tablet Take 1 tablet (10 mg total) by mouth at bedtime as needed (sleep). 90 tablet 0   No current facility-administered medications for this visit.   Facility-Administered Medications Ordered in Other Visits  Medication Dose Route Frequency Provider Last Rate Last Admin  . heparin lock flush 100 unit/mL  500 Units Intracatheter Once PRN Lloyd Huger, MD        OBJECTIVE: Vitals:   03/17/19 1314  BP: 129/83  Pulse: (!) 115  Resp: 18  Temp: 98.4 F (36.9 C)  SpO2: 100%     Body mass index is 28.67 kg/m.    ECOG FS:2 - Symptomatic, <50% confined to bed  General: Thin, no acute distress. Eyes: Pink conjunctiva, anicteric sclera. HEENT: Normocephalic, moist mucous membranes. Lungs: No audible wheezing.  Occasional cough.  Heart: Regular rate and rhythm. Abdomen: Soft, nontender, no obvious distention. Musculoskeletal: 2-3+ lower extremity edema. Neuro: Alert, answering all questions appropriately. Cranial nerves grossly intact. Skin: No rashes or petechiae noted. Psych: Normal affect.   LAB RESULTS:  Lab Results  Component Value Date   NA 136 03/18/2019   K 3.4 (L) 03/18/2019   CL 97 (L) 03/18/2019   CO2 26 03/18/2019   GLUCOSE 208 (H) 03/18/2019   BUN 21 03/18/2019   CREATININE 0.70 03/18/2019   CALCIUM 7.7 (L) 03/18/2019   PROT 5.0 (L) 03/18/2019   ALBUMIN 2.2 (L) 03/18/2019   AST 78 (H) 03/18/2019   ALT 24 03/18/2019   ALKPHOS 163 (H) 03/18/2019   BILITOT 0.9 03/18/2019   GFRNONAA >60 03/18/2019   GFRAA >60 03/18/2019    Lab Results  Component Value Date   WBC 3.9 (L) 03/18/2019   NEUTROABS 2.3 03/18/2019   HGB 8.7 (L) 03/18/2019   HCT 28.6 (L) 03/18/2019   MCV 104.0 (H) 03/18/2019   PLT 54 (L) 03/18/2019     STUDIES: DG Chest 2 View  Result Date: 03/05/2019 CLINICAL DATA:  Cough, weakness, generalized edema EXAM: CHEST - 2 VIEW COMPARISON:  03/03/2018 FINDINGS: Right Port-A-Cath remains in place, unchanged. Heart is normal size. Scarring or atelectasis at the left base. Right lung clear. No effusions or acute bony abnormality. Prior right shoulder replacement. IMPRESSION: Left base scarring or atelectasis. Electronically Signed   By: Rolm Baptise M.D.   On: 03/05/2019 13:15   DG Abd 2 Views  Result Date: 03/05/2019 CLINICAL DATA:  Cough, weakness, swelling in both legs. Abdominal distention. EXAM: ABDOMEN - 2 VIEW COMPARISON:  None. FINDINGS: There is normal bowel gas pattern. No free air. No organomegaly or suspicious calcification. No acute bony abnormality. Degenerative changes and scoliosis in the lumbar spine. Mottled sclerotic appearance of the pelvis compatible with known widespread osseous metastatic disease. IMPRESSION: No acute findings. Electronically Signed   By: Rolm Baptise M.D.   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  PLAN:    1.  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  No other malignancy was noted outside her liver. MRI of the brain on Jun 03, 2018 did not reveal metastatic disease.  Although hospice was previously discussed, patient wishes to continue aggressive treatment.  Initial plan was to give  gemcitabine and carboplatinum on days 1 and 8 with day 15 off, but given her persistent pancytopenia patient will only be able to receive treatment every other week.  Return to clinic in 1 week for further evaluation and consideration of her next treatment which will be considered cycle 2, day 15.  2.  Anemia: Chronic and unchanged.  Patient's hemoglobin is stable at 8.7. 3.  Peripheral neuropathy: Chronic and unchanged.   4.  Back pain: MRI results from May 04, 2017 did not reveal metastatic disease.  Continue symptomatic treatment.  5.  Left IJ clot: Ultrasound results reviewed independently.  Patient is no longer taking Eliquis. 6.  Neutropenia: Mild.  Delay treatment as above. 7.  Memory complaints/confusion: Patient does not complain of this today.  Repeat MRI of the brain on Jun 03, 2018 reviewed independently with no obvious intracranial abnormality.  8.  Elevated liver enzymes: Chronic and unchanged.  AST is 78 today. 9.  Insomnia: Patient does not complain of this today.  Continue Ambien as prescribed.   10.  Weakness and fatigue: Chronic and unchanged. 11.  Anasarca/weight gain: Possibly secondary to nephrotic syndrome given her decreased serum protein and albumin.  Patient reports she cannot do 24-hour urine collection to confirm protein wasting.  Continue Lasix 20 mg twice daily and patient has been instructed to increase to 40 mg twice daily as her blood pressure tolerates.  Cardiac echo is scheduled 1 week from today.  Recent BNP was within normal limits at 61.   12.  Hypokalemia: Patient was instructed to take 20 mEq daily.  Patient expressed understanding and was in  agreement with this plan. She also understands that She can call clinic at any time with any questions, concerns, or complaints.   Breast cancer   Staging form: Breast, AJCC 7th Edition     Pathologic stage from 08/11/2014: Stage IIIA (T0, N2a, cM0) - Signed by Lloyd Huger, MD on 08/11/2014   Lloyd Huger, MD   03/18/2019 11:13 AM

## 2019-03-17 ENCOUNTER — Other Ambulatory Visit: Payer: Self-pay

## 2019-03-17 ENCOUNTER — Encounter: Payer: Self-pay | Admitting: Oncology

## 2019-03-17 NOTE — Progress Notes (Signed)
Patient pre screened for office appointment husband provided information for the chart, he reports that she has been declining over the past few weeks, decreased appetite, nausea, flat affect most of the time.   Patient reminded of upcoming appointment time and date.

## 2019-03-18 ENCOUNTER — Inpatient Hospital Stay: Payer: Medicare Other | Attending: Oncology

## 2019-03-18 ENCOUNTER — Other Ambulatory Visit: Payer: Self-pay

## 2019-03-18 ENCOUNTER — Inpatient Hospital Stay: Payer: Medicare Other

## 2019-03-18 ENCOUNTER — Inpatient Hospital Stay (HOSPITAL_BASED_OUTPATIENT_CLINIC_OR_DEPARTMENT_OTHER): Payer: Medicare Other | Admitting: Oncology

## 2019-03-18 VITALS — BP 129/83 | HR 115 | Temp 98.4°F | Resp 18 | Wt 167.0 lb

## 2019-03-18 DIAGNOSIS — R630 Anorexia: Secondary | ICD-10-CM | POA: Insufficient documentation

## 2019-03-18 DIAGNOSIS — R110 Nausea: Secondary | ICD-10-CM | POA: Diagnosis not present

## 2019-03-18 DIAGNOSIS — E876 Hypokalemia: Secondary | ICD-10-CM | POA: Diagnosis not present

## 2019-03-18 DIAGNOSIS — C7951 Secondary malignant neoplasm of bone: Secondary | ICD-10-CM | POA: Diagnosis not present

## 2019-03-18 DIAGNOSIS — C50919 Malignant neoplasm of unspecified site of unspecified female breast: Secondary | ICD-10-CM | POA: Diagnosis not present

## 2019-03-18 DIAGNOSIS — C787 Secondary malignant neoplasm of liver and intrahepatic bile duct: Secondary | ICD-10-CM | POA: Insufficient documentation

## 2019-03-18 DIAGNOSIS — G620 Drug-induced polyneuropathy: Secondary | ICD-10-CM | POA: Insufficient documentation

## 2019-03-18 DIAGNOSIS — R5383 Other fatigue: Secondary | ICD-10-CM | POA: Diagnosis not present

## 2019-03-18 DIAGNOSIS — R531 Weakness: Secondary | ICD-10-CM | POA: Diagnosis not present

## 2019-03-18 DIAGNOSIS — R609 Edema, unspecified: Secondary | ICD-10-CM | POA: Diagnosis not present

## 2019-03-18 LAB — CBC WITH DIFFERENTIAL/PLATELET
Abs Immature Granulocytes: 0.2 10*3/uL — ABNORMAL HIGH (ref 0.00–0.07)
Basophils Absolute: 0 10*3/uL (ref 0.0–0.1)
Basophils Relative: 1 %
Eosinophils Absolute: 0 10*3/uL (ref 0.0–0.5)
Eosinophils Relative: 1 %
HCT: 28.6 % — ABNORMAL LOW (ref 36.0–46.0)
Hemoglobin: 8.7 g/dL — ABNORMAL LOW (ref 12.0–15.0)
Immature Granulocytes: 5 %
Lymphocytes Relative: 11 %
Lymphs Abs: 0.4 10*3/uL — ABNORMAL LOW (ref 0.7–4.0)
MCH: 31.6 pg (ref 26.0–34.0)
MCHC: 30.4 g/dL (ref 30.0–36.0)
MCV: 104 fL — ABNORMAL HIGH (ref 80.0–100.0)
Monocytes Absolute: 0.9 10*3/uL (ref 0.1–1.0)
Monocytes Relative: 22 %
Neutro Abs: 2.3 10*3/uL (ref 1.7–7.7)
Neutrophils Relative %: 60 %
Platelets: 54 10*3/uL — ABNORMAL LOW (ref 150–400)
RBC: 2.75 MIL/uL — ABNORMAL LOW (ref 3.87–5.11)
RDW: 17.9 % — ABNORMAL HIGH (ref 11.5–15.5)
WBC: 3.9 10*3/uL — ABNORMAL LOW (ref 4.0–10.5)
nRBC: 6 % — ABNORMAL HIGH (ref 0.0–0.2)

## 2019-03-18 LAB — COMPREHENSIVE METABOLIC PANEL
ALT: 24 U/L (ref 0–44)
AST: 78 U/L — ABNORMAL HIGH (ref 15–41)
Albumin: 2.2 g/dL — ABNORMAL LOW (ref 3.5–5.0)
Alkaline Phosphatase: 163 U/L — ABNORMAL HIGH (ref 38–126)
Anion gap: 13 (ref 5–15)
BUN: 21 mg/dL (ref 8–23)
CO2: 26 mmol/L (ref 22–32)
Calcium: 7.7 mg/dL — ABNORMAL LOW (ref 8.9–10.3)
Chloride: 97 mmol/L — ABNORMAL LOW (ref 98–111)
Creatinine, Ser: 0.7 mg/dL (ref 0.44–1.00)
GFR calc Af Amer: >60 mL/min (ref >60)
GFR calc non Af Amer: >60 mL/min (ref >60)
Glucose, Bld: 208 mg/dL — ABNORMAL HIGH (ref 70–99)
Potassium: 3.4 mmol/L — ABNORMAL LOW (ref 3.5–5.1)
Sodium: 136 mmol/L (ref 135–145)
Total Bilirubin: 0.9 mg/dL (ref 0.3–1.2)
Total Protein: 5 g/dL — ABNORMAL LOW (ref 6.5–8.1)

## 2019-03-18 MED ORDER — HEPARIN SOD (PORK) LOCK FLUSH 100 UNIT/ML IV SOLN
500.0000 [IU] | Freq: Once | INTRAVENOUS | Status: AC | PRN
  Filled 2019-03-18: qty 5

## 2019-03-18 MED ORDER — HEPARIN SOD (PORK) LOCK FLUSH 100 UNIT/ML IV SOLN
500.0000 [IU] | Freq: Once | INTRAVENOUS | Status: AC
  Administered 2019-03-18: 500 [IU] via INTRAVENOUS
  Filled 2019-03-18: qty 5

## 2019-03-18 MED ORDER — SODIUM CHLORIDE 0.9% FLUSH
10.0000 mL | Freq: Once | INTRAVENOUS | Status: AC
  Administered 2019-03-18: 10 mL via INTRAVENOUS
  Filled 2019-03-18: qty 10

## 2019-03-19 ENCOUNTER — Other Ambulatory Visit: Payer: Self-pay | Admitting: Oncology

## 2019-03-19 MED ORDER — POTASSIUM CHLORIDE CRYS ER 20 MEQ PO TBCR: 20.0000 meq | EXTENDED_RELEASE_TABLET | Freq: Once | ORAL | 0 refills | Status: AC

## 2019-03-20 ENCOUNTER — Other Ambulatory Visit: Payer: Medicare Other | Admitting: Nurse Practitioner

## 2019-03-20 ENCOUNTER — Other Ambulatory Visit: Payer: Self-pay | Admitting: Oncology

## 2019-03-20 ENCOUNTER — Other Ambulatory Visit: Payer: Self-pay

## 2019-03-20 ENCOUNTER — Encounter: Payer: Self-pay | Admitting: Nurse Practitioner

## 2019-03-20 DIAGNOSIS — R630 Anorexia: Secondary | ICD-10-CM | POA: Diagnosis not present

## 2019-03-20 DIAGNOSIS — Z515 Encounter for palliative care: Secondary | ICD-10-CM
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
## 2019-03-23 NOTE — Progress Notes (Signed)
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  INTERVAL HISTORY: Patient returns to clinic today for further evaluation and reconsideration of her next infusion of carboplatinum and gemcitabine.  Her performance status continues to be decreased.  She continues to have a poor appetite.  Her anasarca and edema is unchanged.  She denies any pain. She has chronic peripheral neuropathy, but no other neurologic complaints. She denies any recent fevers or illnesses.  She denies any chest pain or hemoptysis but has continued shortness of breath and chronic cough. She denies any vomiting, constipation, or diarrhea.  She has no urinary complaints.  Patient offers no further specific complaints today.  REVIEW OF SYSTEMS:   Review of Systems  Constitutional: Positive for malaise/fatigue. Negative for fever and weight loss.  Eyes: Negative.  Negative for blurred vision, double vision and pain.  Respiratory: Positive for cough and shortness of breath.   Cardiovascular: Positive for leg swelling. Negative for chest pain.  Gastrointestinal: Positive for nausea. Negative for abdominal pain.  Genitourinary: Negative.  Negative for dysuria and flank pain.  Musculoskeletal: Negative for back pain and falls.  Skin: Negative.  Negative for rash.  Neurological: Positive for tingling, sensory change and weakness. Negative for focal weakness and headaches.  Endo/Heme/Allergies: Negative.   Psychiatric/Behavioral: Negative.  Negative for memory loss. The patient is not nervous/anxious and does not have  insomnia.     As per HPI. Otherwise, a complete review of systems is negative.  PAST MEDICAL HISTORY: Past Medical History:  Diagnosis Date  . Anxiety   . Back pain    occasionally  . Breast cancer, left ([place]) 2009  . Depression    takes Paxil and Wellbutrin daily  . Genetic testing 02/03/2017   Multi-Cancer panel (83 genes) @ [facility] - No pathogenic mutations detected  . GERD (gastroesophageal reflux disease)    occasional  . History of bronchitis 2 yrs ago  . History of kidney stones   . Hyperlipidemia    takes Atorvastatin daily  . Insomnia    takes Ambien nightly  . Insomnia    takes gabapentin nightly  . Joint pain   . Mood swings   . Osteoarthritis of knee   . Personal history of chemotherapy 12/05/2016   Mets from Breast Cancer  . Personal history of radiation therapy 11/2016  . Pneumonia   . PONV (postoperative nausea and vomiting)   . Seasonal allergies    takes Allegra daily    PAST SURGICAL HISTORY: Past Surgical History:  Procedure Laterality Date  . BREAST BIOPSY  2009  . cataract surgery Bilateral   . COLONOSCOPY    . IR RADIOLOGIST EVAL & MGMT  01/29/2018  . JOINT REPLACEMENT Left 2014   knee  . KNEE ARTHROSCOPY Left    x 5  . MASTECTOMY Left   . port a cath placed    . PORTACATH PLACEMENT N/A 09/17/2015   Procedure: INSERTION PORT-A-CATH;  Surgeon: Jules Husbands, MD;  Location: [facility] ORS;  Service: General;  Laterality: N/A;  . RADIOLOGY WITH ANESTHESIA N/A 02/20/2018   Procedure: CT WITH ANESTHESIA MICROWAVE THERMAL ABLATION-LIVER;  Surgeon: Jacqulynn Cadet, MD;  Location: [facility];  Service: Anesthesiology;  Laterality: N/A;  . TOOTH EXTRACTION    . TOTAL SHOULDER ARTHROPLASTY Right 06/03/2015   Procedure: TOTAL SHOULDER ARTHROPLASTY;  Surgeon: Tania Ade, MD;  Location: [place];  Service: Orthopedics;  Laterality: Right;  Right total shoulder arthroplasty  . TOTAL SHOULDER REPLACEMENT Right 06/03/2015  . TUBAL LIGATION      FAMILY HISTORY:  Father with non-Hodgkin's lymphoma, 2 paternal aunts with breast cancer.     ADVANCED DIRECTIVES:    HEALTH MAINTENANCE: Social History   Tobacco Use  . Smoking status: Never Smoker  . Smokeless tobacco: Never Used  Substance Use Topics  . Alcohol use: Yes    Alcohol/week: 0.0 standard drinks    Comment: occasionally wine  . Drug use: Yes    Types: Marijuana    Comment: cannabis with no extra  low dose edibles  for neuropathy     Colonoscopy:  PAP:  Bone density:  Lipid panel:  Allergies  Allergen Reactions  . Ace Inhibitors Cough  . Latex Itching  . Morphine And Related Itching    Caused her to itch terribly. Would prefer if given to take with a benadryl  . Penicillins Rash    Has patient had a PCN reaction causing immediate rash, facial/tongue/throat swelling, SOB or lightheadedness with hypotension: No Has patient had a PCN reaction causing severe rash involving mucus membranes or skin necrosis: No Has patient had a PCN reaction that required hospitalization No Has patient had a PCN reaction occurring within the last 10 years: No If all of the above answers are "NO", then may proceed with Cephalosporin use.    Current Outpatient Medications  Medication Sig Dispense Refill  . acetaminophen (TYLENOL) 500 MG tablet Take 500 mg by mouth every 6 (six) hours as needed for mild pain or moderate pain.     Marland Kitchen atorvastatin (LIPITOR) 10 MG tablet Take 10 mg by mouth at bedtime.     Marland Kitchen buPROPion (WELLBUTRIN XL) 150 MG 24 hr tablet Take 1 tablet (150 mg total) by mouth at bedtime. 90 tablet 0  . Calcium-Magnesium-Vitamin D (CALCIUM 1200+D3 PO) Take 1 tablet by mouth daily.     . ergocalciferol (VITAMIN D2) 1.25 MG (50000 UT) capsule Vitamin D2 1,250 mcg (50,000 unit) capsule  TAKE 1 CAPSULE (50,000 UNITS TOTAL) BY MOUTH EVERY 7 (SEVEN) DAYS.    . fexofenadine (ALLEGRA) 180 MG tablet Take 180 mg by mouth at bedtime.     . furosemide (LASIX) 20 MG tablet Take 2 tablets (40 mg  total) by mouth 2 (two) times daily. 90 tablet 1  . furosemide (LASIX) 40 MG tablet Take 40 mg by mouth 2 (two) times daily.    Marland Kitchen lidocaine-prilocaine (EMLA) cream Apply 1 application topically as needed. Apply to port 1-2 hours prior to chemotherapy appointment. Cover with plastic wrap. 30 g 0  . Oxycodone HCl 10 MG TABS Take 1 tablet (10 mg total) by mouth every 4 (four) hours as needed. Ok to take every 4-6 hours as needed. 90 tablet 0  . pantoprazole (PROTONIX) 40 MG tablet Take 40 mg by mouth at bedtime as needed.     Marland Kitchen PARoxetine (PAXIL) 40 MG tablet Take 40 mg by mouth at bedtime.     . prochlorperazine (COMPAZINE) 10 MG tablet TAKE 1 TABLET BY MOUTH EVERY 6 HOURS AS NEEDED FOR  NAUSEA OR  VOMITING 120 tablet 1  . sodium chloride (MURO 128) 5 % ophthalmic solution Place 1 drop into both  eyes 4 (four) times daily.    Marland Kitchen zolpidem (AMBIEN) 10 MG tablet Take 1 tablet (10 mg total) by mouth at bedtime as needed (sleep). 90 tablet 0  . benzonatate (TESSALON) 100 MG capsule Take 1 capsule (100 mg total) by mouth 3 (three) times daily as needed for cough. 60 capsule 1  . potassium chloride SA (KLOR-CON) 20 MEQ tablet Take 1 tablet (20 mEq total) by mouth once for 1 dose. 10 tablet 0   No current facility-administered medications for this visit.   Facility-Administered Medications Ordered in Other Visits  Medication Dose Route Frequency Provider Last Rate Last Admin  . heparin lock flush 100 unit/mL  500 Units Intracatheter Once PRN Lloyd Huger, MD      . heparin lock flush 100 unit/mL  500 Units Intravenous Once Lloyd Huger, MD      . sodium chloride flush (NS) 0.9 % injection 10 mL  10 mL Intravenous Once Lloyd Huger, MD        OBJECTIVE: Vitals:   03/26/19 1000  BP: 111/75     There is no height or weight on file to calculate BMI.    ECOG FS:3 - Symptomatic, >50% confined to bed  General: Thin, no acute distress.  Sitting in a wheelchair. Eyes: Pink conjunctiva,  anicteric sclera. HEENT: Normocephalic, moist mucous membranes. Lungs: No audible wheezing or coughing. Heart: Regular rate and rhythm. Abdomen: Soft, nontender, no obvious distention. Musculoskeletal: No edema, cyanosis, or clubbing. Neuro: Alert, answering all questions appropriately. Cranial nerves grossly intact. Skin: No rashes or petechiae noted. Psych: Normal affect.   LAB RESULTS:  Lab Results  Component Value Date   NA 131 (L) 03/26/2019   K 3.6 03/26/2019   CL 94 (L) 03/26/2019   CO2 26 03/26/2019   GLUCOSE 219 (H) 03/26/2019   BUN 13 03/26/2019   CREATININE 0.82 03/26/2019   CALCIUM 7.5 (L) 03/26/2019   PROT 4.9 (L) 03/26/2019   ALBUMIN 2.1 (L) 03/26/2019   AST 60 (H) 03/26/2019   ALT 21 03/26/2019   ALKPHOS 169 (H) 03/26/2019   BILITOT 1.0 03/26/2019   GFRNONAA >60 03/26/2019   GFRAA >60 03/26/2019    Lab Results  Component Value Date   WBC 4.5 03/26/2019   NEUTROABS 2.7 03/26/2019   HGB 8.6 (L) 03/26/2019   HCT 28.9 (L) 03/26/2019   MCV 106.3 (H) 03/26/2019   PLT 121 (L) 03/26/2019     STUDIES: DG Chest 2 View  Result Date: 03/05/2019 CLINICAL DATA:  Cough, weakness, generalized edema EXAM: CHEST - 2 VIEW COMPARISON:  03/03/2018 FINDINGS: Right Port-A-Cath remains in place, unchanged. Heart is normal size. Scarring or atelectasis at the left base. Right lung clear. No effusions or acute bony abnormality. Prior right shoulder replacement. IMPRESSION: Left base scarring or atelectasis. Electronically Signed   By: Rolm Baptise M.D.   On: 03/05/2019 13:15   DG Abd 2 Views  Result Date: 03/05/2019 CLINICAL DATA:  Cough, weakness, swelling in both legs. Abdominal distention. EXAM: ABDOMEN - 2 VIEW COMPARISON:  None. FINDINGS: There is normal bowel gas pattern. No free air. No organomegaly or suspicious calcification. No acute bony abnormality. Degenerative changes and scoliosis in the lumbar spine. Mottled sclerotic appearance of the pelvis compatible with  known widespread osseous metastatic disease. IMPRESSION: No acute findings. Electronically Signed   By: Rolm Baptise M.D.   On: 03/05/2019 13:16   ECHOCARDIOGRAM COMPLETE  Result Date: 03/25/2019    ECHOCARDIOGRAM REPORT   Patient Name:  Amanda [name] Date of Exam: 03/25/2019 Medical Rec #:  [id number]      Height:       64.0 in Accession #:    [id number]     Weight:       167.0 lb Date of Birth:  29-Aug-1950      BSA:          1.812 m Patient Age:    34 years       BP:           129/83 mmHg Patient Gender: F              HR:           115 bpm. Exam Location:  [facility] Procedure: 2D Echo, Color Doppler and Cardiac Doppler Indications:     v58.11 Chemotherapy evaluation  History:         Patient has prior history of Echocardiogram examinations. Risk                  Factors:Dyslipidemia. Breast cancer w/ left breast mastectomy.  Sonographer:     Charmayne Sheer RDCS (AE) Referring Phys:  [place] E BURNS Diagnosing Phys: Harrell Gave End MD  Sonographer Comments: Suboptimal parasternal window and no subcostal window. Image acquisition challenging due to mastectomy. IMPRESSIONS  1. Left ventricular ejection fraction, by estimation, is 50 to 55%. The left ventricle has low normal function. Left ventricular endocardial border not optimally defined to evaluate regional wall motion. Left ventricular diastolic parameters are consistent with Grade II diastolic dysfunction (pseudonormalization).  2. Right ventricular systolic function is normal. The right ventricular size is normal. Tricuspid regurgitation signal is inadequate for assessing PA pressure.  3. The mitral valve was not well visualized. No evidence of mitral valve regurgitation. No evidence of mitral stenosis.  4. The aortic valve was not well visualized. Aortic valve regurgitation is not visualized. No aortic stenosis is present. FINDINGS  Left Ventricle: Left ventricular ejection fraction, by estimation, is 50 to 55%. The left ventricle has low normal function.  Left ventricular endocardial border not optimally defined to evaluate regional wall motion. The left ventricular internal cavity  size was normal in size. There is no left ventricular hypertrophy. Left ventricular diastolic parameters are consistent with Grade II diastolic dysfunction (pseudonormalization). Right Ventricle: The right ventricular size is normal. No increase in right ventricular wall thickness. Right ventricular systolic function is normal. Tricuspid regurgitation signal is inadequate for assessing PA pressure. Left Atrium: Left atrial size was normal in size. Right Atrium: Right atrial size was normal in size. Pericardium: The pericardium was not well visualized. Mitral Valve: The mitral valve was not well visualized. No evidence of mitral valve regurgitation. No evidence of mitral valve stenosis. MV peak gradient, 4.6 mmHg. The mean mitral valve gradient is 2.0 mmHg. Tricuspid Valve: The tricuspid valve is grossly normal. Tricuspid valve regurgitation is not demonstrated. Aortic Valve: The aortic valve was not well visualized. Aortic valve regurgitation is not visualized. No aortic stenosis is present. Aortic valve mean gradient measures 3.0 mmHg. Aortic valve peak gradient measures 5.0 mmHg. Aortic valve area, by VTI measures 1.10 cm. Pulmonic Valve: The pulmonic valve was not well visualized. Pulmonic valve regurgitation is trivial. No evidence of pulmonic stenosis. Aorta: The aortic root is normal in size and structure. Pulmonary Artery: The pulmonary artery is not well seen. Venous: The inferior vena cava was not well visualized. IAS/Shunts: The interatrial septum was not well visualized.  LEFT VENTRICLE PLAX 2D LVIDd:  4.34 cm  Diastology LVIDs:         3.04 cm  LV e' lateral:   8.92 cm/s LV PW:         0.89 cm  LV E/e' lateral: 9.6 LV IVS:        0.65 cm  LV e' medial:    6.53 cm/s LVOT diam:     1.80 cm  LV E/e' medial:  13.2 LV SV:         22 LV SV Index:   12 LVOT Area:     2.54 cm   RIGHT VENTRICLE RV Basal diam:  3.95 cm LEFT ATRIUM             Index       RIGHT ATRIUM           Index LA diam:        4.30 cm 2.37 cm/m  RA Area:     13.20 cm LA Vol (A2C):   31.6 ml 17.44 ml/m RA Volume:   33.00 ml  18.21 ml/m LA Vol (A4C):   42.5 ml 23.45 ml/m LA Biplane Vol: 36.9 ml 20.36 ml/m  AORTIC VALVE                   PULMONIC VALVE AV Area (Vmax):    1.61 cm    PV Vmax:       1.00 m/s AV Area (Vmean):   1.22 cm    PV Vmean:      69.300 cm/s AV Area (VTI):     1.10 cm    PV VTI:        0.165 m AV Vmax:           112.00 cm/s PV Peak grad:  4.0 mmHg AV Vmean:          82.000 cm/s PV Mean grad:  2.0 mmHg AV VTI:            0.196 m AV Peak Grad:      5.0 mmHg AV Mean Grad:      3.0 mmHg LVOT Vmax:         70.90 cm/s LVOT Vmean:        39.200 cm/s LVOT VTI:          0.085 m LVOT/AV VTI ratio: 0.43  AORTA Ao Root diam: 2.90 cm MITRAL VALVE MV Area (PHT): 6.71 cm    SHUNTS MV Peak grad:  4.6 mmHg    Systemic VTI:  0.09 m MV Mean grad:  2.0 mmHg    Systemic Diam: 1.80 cm MV Vmax:       1.07 m/s MV Vmean:      61.6 cm/s MV Decel Time: 113 msec MV E velocity: 85.90 cm/s MV A velocity: 50.00 cm/s MV E/A ratio:  1.72 Harrell Gave End MD Electronically signed by Nelva Bush MD Signature Date/Time: 03/25/2019/3:13:53 PM    Final     ASSESSMENT:  Progressive ER/PR positive, 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  PLAN:    1.  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  No other malignancy was noted outside her liver. MRI of the brain on Jun 03, 2018 did not reveal metastatic disease.  Although hospice was previously discussed, patient wishes to continue aggressive treatment.  Initial plan was to give gemcitabine and carboplatinum on days 1  and 8 with day 15 off, but given her persistent pancytopenia patient will only be able  to receive treatment every other week.  Given her declining performance status, will delay treatment for 4 weeks at which time patient will return to clinic for further evaluation and discussion on whether or not to continue treatments. 2.  Anemia: Chronic and unchanged.  Patient's hemoglobin is stable at 8.6. 3.  Peripheral neuropathy: Chronic and unchanged.   4.  Back pain: MRI results from May 04, 2017 did not reveal metastatic disease.  Continue symptomatic treatment.  5.  Left IJ clot: Ultrasound results reviewed independently.  Patient is no longer taking Eliquis. 6.  Neutropenia: Resolved. 7.  Memory complaints/confusion: Patient does not complain of this today.  Repeat MRI of the brain on Jun 03, 2018 reviewed independently with no obvious intracranial abnormality.  8.  Elevated liver enzymes: Chronic and unchanged.  AST is 60 today. 9.  Insomnia: Patient does not complain of this today.  Continue Ambien as prescribed.   10.  Weakness and fatigue: Patient's performance status is decreasing, delay treatment as above. 11.  Anasarca/weight gain: Likely secondary to protein malnutrition given her decreased serum protein and albumin.  Patient reports she cannot do 24-hour urine collection to confirm protein wasting.  Continue Lasix 20-40 mg once/twice daily as her blood pressure permits.  Cardiac echo from March 25, 2019 reported an EF of greater than 50%.  Recent BNP was within normal limits at 61.   12.  Hypokalemia: Resolved.  Patient states potassium pills are too difficult to swallow.  Monitor. 13.  Cough: Patient was given a prescription for Tessalon Perles.  Patient expressed understanding and was in agreement with this plan. She also understands that She can call clinic at any time with any questions, concerns, or complaints.   Breast cancer   Staging form: Breast, AJCC 7th Edition     Pathologic stage from 08/11/2014: Stage IIIA (T0, N2a, cM0) - Signed by Lloyd Huger, MD on  08/11/2014   Lloyd Huger, MD   03/26/2019 2:51 PM

## 2019-03-25 ENCOUNTER — Ambulatory Visit
Admission: RE | Admit: 2019-03-25 | Discharge: 2019-03-25 | Disposition: A | Discharge status: Home / Self Care | Payer: Medicare Other | Source: Ambulatory Visit | Attending: Oncology | Admitting: Oncology

## 2019-03-25 ENCOUNTER — Other Ambulatory Visit: Payer: Self-pay

## 2019-03-25 DIAGNOSIS — Z853 Personal history of malignant neoplasm of breast: Secondary | ICD-10-CM | POA: Insufficient documentation

## 2019-03-25 DIAGNOSIS — Z9012 Acquired absence of left breast and nipple: Secondary | ICD-10-CM | POA: Insufficient documentation

## 2019-03-25 DIAGNOSIS — R601 Generalized edema: Secondary | ICD-10-CM | POA: Diagnosis not present

## 2019-03-25 DIAGNOSIS — E785 Hyperlipidemia, unspecified: Secondary | ICD-10-CM | POA: Insufficient documentation

## 2019-03-25 NOTE — Progress Notes (Signed)
*  PRELIMINARY RESULTS* Echocardiogram 2D Echocardiogram has been performed.  Amanda [name] 03/25/2019, 10:56 AM

## 2019-03-26 ENCOUNTER — Inpatient Hospital Stay: Payer: Medicare Other

## 2019-03-26 ENCOUNTER — Encounter: Payer: Self-pay | Admitting: Oncology

## 2019-03-26 ENCOUNTER — Inpatient Hospital Stay (HOSPITAL_BASED_OUTPATIENT_CLINIC_OR_DEPARTMENT_OTHER): Payer: Medicare Other | Admitting: Oncology

## 2019-03-26 VITALS — BP 111/75

## 2019-03-26 DIAGNOSIS — C50919 Malignant neoplasm of unspecified site of unspecified female breast: Secondary | ICD-10-CM

## 2019-03-26 DIAGNOSIS — R110 Nausea: Secondary | ICD-10-CM | POA: Diagnosis not present

## 2019-03-26 DIAGNOSIS — C7951 Secondary malignant neoplasm of bone: Secondary | ICD-10-CM | POA: Diagnosis not present

## 2019-03-26 DIAGNOSIS — Z95828 Presence of other vascular implants and grafts: Secondary | ICD-10-CM

## 2019-03-26 DIAGNOSIS — C787 Secondary malignant neoplasm of liver and intrahepatic bile duct: Secondary | ICD-10-CM | POA: Diagnosis not present

## 2019-03-26 DIAGNOSIS — G620 Drug-induced polyneuropathy: Secondary | ICD-10-CM | POA: Diagnosis not present

## 2019-03-26 DIAGNOSIS — E876 Hypokalemia: Secondary | ICD-10-CM | POA: Diagnosis not present

## 2019-03-26 LAB — COMPREHENSIVE METABOLIC PANEL
ALT: 21 U/L (ref 0–44)
AST: 60 U/L — ABNORMAL HIGH (ref 15–41)
Albumin: 2.1 g/dL — ABNORMAL LOW (ref 3.5–5.0)
Alkaline Phosphatase: 169 U/L — ABNORMAL HIGH (ref 38–126)
Anion gap: 11 (ref 5–15)
BUN: 13 mg/dL (ref 8–23)
CO2: 26 mmol/L (ref 22–32)
Calcium: 7.5 mg/dL — ABNORMAL LOW (ref 8.9–10.3)
Chloride: 94 mmol/L — ABNORMAL LOW (ref 98–111)
Creatinine, Ser: 0.82 mg/dL (ref 0.44–1.00)
GFR calc Af Amer: >60 mL/min (ref >60)
GFR calc non Af Amer: >60 mL/min (ref >60)
Glucose, Bld: 219 mg/dL — ABNORMAL HIGH (ref 70–99)
Potassium: 3.6 mmol/L (ref 3.5–5.1)
Sodium: 131 mmol/L — ABNORMAL LOW (ref 135–145)
Total Bilirubin: 1 mg/dL (ref 0.3–1.2)
Total Protein: 4.9 g/dL — ABNORMAL LOW (ref 6.5–8.1)

## 2019-03-26 LAB — CBC WITH DIFFERENTIAL/PLATELET
Abs Immature Granulocytes: 0.24 10*3/uL — ABNORMAL HIGH (ref 0.00–0.07)
Basophils Absolute: 0 10*3/uL (ref 0.0–0.1)
Basophils Relative: 0 %
Eosinophils Absolute: 0 10*3/uL (ref 0.0–0.5)
Eosinophils Relative: 0 %
HCT: 28.9 % — ABNORMAL LOW (ref 36.0–46.0)
Hemoglobin: 8.6 g/dL — ABNORMAL LOW (ref 12.0–15.0)
Immature Granulocytes: 5 %
Lymphocytes Relative: 12 %
Lymphs Abs: 0.5 10*3/uL — ABNORMAL LOW (ref 0.7–4.0)
MCH: 31.6 pg (ref 26.0–34.0)
MCHC: 29.8 g/dL — ABNORMAL LOW (ref 30.0–36.0)
MCV: 106.3 fL — ABNORMAL HIGH (ref 80.0–100.0)
Monocytes Absolute: 1 10*3/uL (ref 0.1–1.0)
Monocytes Relative: 23 %
Neutro Abs: 2.7 10*3/uL (ref 1.7–7.7)
Neutrophils Relative %: 60 %
Platelets: 121 10*3/uL — ABNORMAL LOW (ref 150–400)
RBC: 2.72 MIL/uL — ABNORMAL LOW (ref 3.87–5.11)
RDW: 20.2 % — ABNORMAL HIGH (ref 11.5–15.5)
WBC: 4.5 10*3/uL (ref 4.0–10.5)
nRBC: 0.7 % — ABNORMAL HIGH (ref 0.0–0.2)

## 2019-03-26 MED ORDER — HEPARIN SOD (PORK) LOCK FLUSH 100 UNIT/ML IV SOLN
500.0000 [IU] | Freq: Once | INTRAVENOUS | Status: AC
  Filled 2019-03-26: qty 5

## 2019-03-26 MED ORDER — HEPARIN SOD (PORK) LOCK FLUSH 100 UNIT/ML IV SOLN
500.0000 [IU] | Freq: Once | INTRAVENOUS | Status: AC
  Administered 2019-03-26: 500 [IU] via INTRAVENOUS
  Filled 2019-03-26: qty 5

## 2019-03-26 MED ORDER — BENZONATATE 100 MG PO CAPS: 100.0000 mg | ORAL_CAPSULE | Freq: Three times a day (TID) | ORAL | 1 refills | Status: AC | PRN

## 2019-03-26 MED ORDER — SODIUM CHLORIDE 0.9% FLUSH
10.0000 mL | Freq: Once | INTRAVENOUS | Status: AC
  Filled 2019-03-26: qty 10

## 2019-03-26 MED ORDER — FUROSEMIDE 20 MG PO TABS: 40.0000 mg | ORAL_TABLET | Freq: Two times a day (BID) | ORAL | 1 refills | Status: AC

## 2019-03-26 NOTE — Progress Notes (Signed)
Cough has gotten worse and spitting up a "clear slime"

## 2019-03-28 ENCOUNTER — Other Ambulatory Visit: Payer: Medicare Other | Admitting: Nurse Practitioner

## 2019-03-28 ENCOUNTER — Other Ambulatory Visit: Payer: Self-pay

## 2019-03-28 ENCOUNTER — Encounter: Payer: Self-pay | Admitting: Nurse Practitioner

## 2019-03-28 DIAGNOSIS — Z515 Encounter for palliative care: Secondary | ICD-10-CM

## 2019-03-28 DIAGNOSIS — C50912 Malignant neoplasm of unspecified site of left female breast: Secondary | ICD-10-CM | POA: Diagnosis not present
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
## 2019-04-04 ENCOUNTER — Encounter: Payer: Self-pay | Admitting: Nurse Practitioner

## 2019-04-04 ENCOUNTER — Other Ambulatory Visit: Payer: Self-pay

## 2019-04-04 ENCOUNTER — Other Ambulatory Visit: Payer: Medicare Other | Admitting: Nurse Practitioner

## 2019-04-04 DIAGNOSIS — C50912 Malignant neoplasm of unspecified site of left female breast: Secondary | ICD-10-CM

## 2019-04-04 DIAGNOSIS — Z515 Encounter for palliative care: Secondary | ICD-10-CM
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
## 2019-04-11 ENCOUNTER — Encounter: Payer: Self-pay | Admitting: Nurse Practitioner

## 2019-04-11 ENCOUNTER — Other Ambulatory Visit: Payer: Medicare Other | Admitting: Nurse Practitioner

## 2019-04-11 DIAGNOSIS — C50912 Malignant neoplasm of unspecified site of left female breast: Secondary | ICD-10-CM

## 2019-04-11 DIAGNOSIS — Z515 Encounter for palliative care: Secondary | ICD-10-CM | POA: Diagnosis not present
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
## 2019-04-14 ENCOUNTER — Other Ambulatory Visit: Payer: Medicare Other | Admitting: Nurse Practitioner

## 2019-04-14 ENCOUNTER — Encounter: Payer: Self-pay | Admitting: Nurse Practitioner

## 2019-04-14 ENCOUNTER — Other Ambulatory Visit: Payer: Self-pay

## 2019-04-14 DIAGNOSIS — Z515 Encounter for palliative care: Secondary | ICD-10-CM | POA: Diagnosis not present

## 2019-04-14 DIAGNOSIS — C50912 Malignant neoplasm of unspecified site of left female breast: Secondary | ICD-10-CM | POA: Diagnosis not present
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
## 2019-04-15 DIAGNOSIS — Z86718 Personal history of other venous thrombosis and embolism: Secondary | ICD-10-CM | POA: Diagnosis not present

## 2019-04-15 DIAGNOSIS — E43 Unspecified severe protein-calorie malnutrition: Secondary | ICD-10-CM | POA: Diagnosis not present

## 2019-04-15 DIAGNOSIS — L89322 Pressure ulcer of left buttock, stage 2: Secondary | ICD-10-CM | POA: Diagnosis not present

## 2019-04-15 DIAGNOSIS — Z170 Estrogen receptor positive status [ER+]: Secondary | ICD-10-CM | POA: Diagnosis not present

## 2019-04-15 DIAGNOSIS — F329 Major depressive disorder, single episode, unspecified: Secondary | ICD-10-CM | POA: Diagnosis not present

## 2019-04-15 DIAGNOSIS — C50912 Malignant neoplasm of unspecified site of left female breast: Secondary | ICD-10-CM | POA: Diagnosis not present

## 2019-04-15 DIAGNOSIS — M1990 Unspecified osteoarthritis, unspecified site: Secondary | ICD-10-CM | POA: Diagnosis not present

## 2019-04-15 DIAGNOSIS — C7951 Secondary malignant neoplasm of bone: Secondary | ICD-10-CM | POA: Diagnosis not present

## 2019-04-15 DIAGNOSIS — G4700 Insomnia, unspecified: Secondary | ICD-10-CM | POA: Diagnosis not present

## 2019-04-15 DIAGNOSIS — E785 Hyperlipidemia, unspecified: Secondary | ICD-10-CM | POA: Diagnosis not present

## 2019-04-15 DIAGNOSIS — C787 Secondary malignant neoplasm of liver and intrahepatic bile duct: Secondary | ICD-10-CM | POA: Diagnosis not present

## 2019-04-15 DIAGNOSIS — L89312 Pressure ulcer of right buttock, stage 2: Secondary | ICD-10-CM | POA: Diagnosis not present

## 2019-04-15 DIAGNOSIS — F419 Anxiety disorder, unspecified: Secondary | ICD-10-CM | POA: Diagnosis not present

## 2019-04-15 DIAGNOSIS — E119 Type 2 diabetes mellitus without complications: Secondary | ICD-10-CM | POA: Diagnosis not present

## 2019-04-15 DIAGNOSIS — Z9012 Acquired absence of left breast and nipple: Secondary | ICD-10-CM | POA: Diagnosis not present

## 2019-04-15 DIAGNOSIS — I10 Essential (primary) hypertension: Secondary | ICD-10-CM | POA: Diagnosis not present

## 2019-04-15 DIAGNOSIS — T451X5S Adverse effect of antineoplastic and immunosuppressive drugs, sequela: Secondary | ICD-10-CM | POA: Diagnosis not present

## 2019-04-15 DIAGNOSIS — R601 Generalized edema: Secondary | ICD-10-CM | POA: Diagnosis not present

## 2019-04-15 DIAGNOSIS — G620 Drug-induced polyneuropathy: Secondary | ICD-10-CM | POA: Diagnosis not present

## 2019-04-15 NOTE — Progress Notes (Signed)
Pharmacist Chemotherapy Monitoring - Follow Up Assessment    I verify that I have reviewed each item in the below checklist:  . Regimen for the patient is scheduled for the appropriate day and plan matches scheduled date. Marland Kitchen Appropriate non-routine labs are ordered dependent on drug ordered. . If applicable, additional medications reviewed and ordered per protocol based on lifetime cumulative doses and/or treatment regimen.   Plan for follow-up and/or issues identified: Yes . I-vent associated with next due treatment: No . MD and/or nursing notified: Yes  Aerielle Stoklosa K 04/15/2019 1:24 PM

## 2019-04-16 DIAGNOSIS — Z170 Estrogen receptor positive status [ER+]: Secondary | ICD-10-CM | POA: Diagnosis not present

## 2019-04-16 DIAGNOSIS — C7951 Secondary malignant neoplasm of bone: Secondary | ICD-10-CM | POA: Diagnosis not present

## 2019-04-16 DIAGNOSIS — E785 Hyperlipidemia, unspecified: Secondary | ICD-10-CM | POA: Diagnosis not present

## 2019-04-16 DIAGNOSIS — I10 Essential (primary) hypertension: Secondary | ICD-10-CM | POA: Diagnosis not present

## 2019-04-16 DIAGNOSIS — C50912 Malignant neoplasm of unspecified site of left female breast: Secondary | ICD-10-CM | POA: Diagnosis not present

## 2019-04-16 DIAGNOSIS — C787 Secondary malignant neoplasm of liver and intrahepatic bile duct: Secondary | ICD-10-CM | POA: Diagnosis not present

## 2019-04-17 DIAGNOSIS — C7951 Secondary malignant neoplasm of bone: Secondary | ICD-10-CM | POA: Diagnosis not present

## 2019-04-17 DIAGNOSIS — F329 Major depressive disorder, single episode, unspecified: Secondary | ICD-10-CM | POA: Diagnosis not present

## 2019-04-17 DIAGNOSIS — L89312 Pressure ulcer of right buttock, stage 2: Secondary | ICD-10-CM | POA: Diagnosis not present

## 2019-04-17 DIAGNOSIS — C787 Secondary malignant neoplasm of liver and intrahepatic bile duct: Secondary | ICD-10-CM | POA: Diagnosis not present

## 2019-04-17 DIAGNOSIS — F419 Anxiety disorder, unspecified: Secondary | ICD-10-CM | POA: Diagnosis not present

## 2019-04-17 DIAGNOSIS — T451X5S Adverse effect of antineoplastic and immunosuppressive drugs, sequela: Secondary | ICD-10-CM | POA: Diagnosis not present

## 2019-04-17 DIAGNOSIS — G4700 Insomnia, unspecified: Secondary | ICD-10-CM | POA: Diagnosis not present

## 2019-04-17 DIAGNOSIS — R601 Generalized edema: Secondary | ICD-10-CM | POA: Diagnosis not present

## 2019-04-17 DIAGNOSIS — C50912 Malignant neoplasm of unspecified site of left female breast: Secondary | ICD-10-CM | POA: Diagnosis not present

## 2019-04-17 DIAGNOSIS — Z9012 Acquired absence of left breast and nipple: Secondary | ICD-10-CM | POA: Diagnosis not present

## 2019-04-17 DIAGNOSIS — Z170 Estrogen receptor positive status [ER+]: Secondary | ICD-10-CM | POA: Diagnosis not present

## 2019-04-17 DIAGNOSIS — E119 Type 2 diabetes mellitus without complications: Secondary | ICD-10-CM | POA: Diagnosis not present

## 2019-04-17 DIAGNOSIS — M1990 Unspecified osteoarthritis, unspecified site: Secondary | ICD-10-CM | POA: Diagnosis not present

## 2019-04-17 DIAGNOSIS — E43 Unspecified severe protein-calorie malnutrition: Secondary | ICD-10-CM | POA: Diagnosis not present

## 2019-04-17 DIAGNOSIS — E785 Hyperlipidemia, unspecified: Secondary | ICD-10-CM | POA: Diagnosis not present

## 2019-04-17 DIAGNOSIS — L89322 Pressure ulcer of left buttock, stage 2: Secondary | ICD-10-CM | POA: Diagnosis not present

## 2019-04-17 DIAGNOSIS — I10 Essential (primary) hypertension: Secondary | ICD-10-CM | POA: Diagnosis not present

## 2019-04-17 DIAGNOSIS — G620 Drug-induced polyneuropathy: Secondary | ICD-10-CM | POA: Diagnosis not present

## 2019-04-17 DIAGNOSIS — Z86718 Personal history of other venous thrombosis and embolism: Secondary | ICD-10-CM | POA: Diagnosis not present

## 2019-04-18 DIAGNOSIS — I10 Essential (primary) hypertension: Secondary | ICD-10-CM | POA: Diagnosis not present

## 2019-04-18 DIAGNOSIS — C50912 Malignant neoplasm of unspecified site of left female breast: Secondary | ICD-10-CM | POA: Diagnosis not present

## 2019-04-18 DIAGNOSIS — C787 Secondary malignant neoplasm of liver and intrahepatic bile duct: Secondary | ICD-10-CM | POA: Diagnosis not present

## 2019-04-18 DIAGNOSIS — C7951 Secondary malignant neoplasm of bone: Secondary | ICD-10-CM | POA: Diagnosis not present

## 2019-04-18 DIAGNOSIS — Z170 Estrogen receptor positive status [ER+]: Secondary | ICD-10-CM | POA: Diagnosis not present

## 2019-04-18 DIAGNOSIS — E785 Hyperlipidemia, unspecified: Secondary | ICD-10-CM | POA: Diagnosis not present

## 2019-04-21 DIAGNOSIS — C50912 Malignant neoplasm of unspecified site of left female breast: Secondary | ICD-10-CM | POA: Diagnosis not present

## 2019-04-21 DIAGNOSIS — C787 Secondary malignant neoplasm of liver and intrahepatic bile duct: Secondary | ICD-10-CM | POA: Diagnosis not present

## 2019-04-21 DIAGNOSIS — Z170 Estrogen receptor positive status [ER+]: Secondary | ICD-10-CM | POA: Diagnosis not present

## 2019-04-21 DIAGNOSIS — E785 Hyperlipidemia, unspecified: Secondary | ICD-10-CM | POA: Diagnosis not present

## 2019-04-21 DIAGNOSIS — C7951 Secondary malignant neoplasm of bone: Secondary | ICD-10-CM | POA: Diagnosis not present

## 2019-04-21 DIAGNOSIS — I10 Essential (primary) hypertension: Secondary | ICD-10-CM | POA: Diagnosis not present

## 2019-04-22 DIAGNOSIS — C7951 Secondary malignant neoplasm of bone: Secondary | ICD-10-CM | POA: Diagnosis not present

## 2019-04-22 DIAGNOSIS — E785 Hyperlipidemia, unspecified: Secondary | ICD-10-CM | POA: Diagnosis not present

## 2019-04-22 DIAGNOSIS — Z170 Estrogen receptor positive status [ER+]: Secondary | ICD-10-CM | POA: Diagnosis not present

## 2019-04-22 DIAGNOSIS — C787 Secondary malignant neoplasm of liver and intrahepatic bile duct: Secondary | ICD-10-CM | POA: Diagnosis not present

## 2019-04-22 DIAGNOSIS — C50912 Malignant neoplasm of unspecified site of left female breast: Secondary | ICD-10-CM | POA: Diagnosis not present

## 2019-04-22 DIAGNOSIS — I10 Essential (primary) hypertension: Secondary | ICD-10-CM | POA: Diagnosis not present

## 2019-04-23 ENCOUNTER — Inpatient Hospital Stay: Payer: Medicare Other

## 2019-04-23 ENCOUNTER — Inpatient Hospital Stay: Payer: Medicare Other | Admitting: Oncology

## 2019-04-23 DIAGNOSIS — C7951 Secondary malignant neoplasm of bone: Secondary | ICD-10-CM | POA: Diagnosis not present

## 2019-04-23 DIAGNOSIS — I10 Essential (primary) hypertension: Secondary | ICD-10-CM | POA: Diagnosis not present

## 2019-04-23 DIAGNOSIS — C50912 Malignant neoplasm of unspecified site of left female breast: Secondary | ICD-10-CM | POA: Diagnosis not present

## 2019-04-23 DIAGNOSIS — Z170 Estrogen receptor positive status [ER+]: Secondary | ICD-10-CM | POA: Diagnosis not present

## 2019-04-23 DIAGNOSIS — E785 Hyperlipidemia, unspecified: Secondary | ICD-10-CM | POA: Diagnosis not present

## 2019-04-23 DIAGNOSIS — C787 Secondary malignant neoplasm of liver and intrahepatic bile duct: Secondary | ICD-10-CM | POA: Diagnosis not present

## 2019-04-24 DIAGNOSIS — Z170 Estrogen receptor positive status [ER+]: Secondary | ICD-10-CM | POA: Diagnosis not present

## 2019-04-24 DIAGNOSIS — C787 Secondary malignant neoplasm of liver and intrahepatic bile duct: Secondary | ICD-10-CM | POA: Diagnosis not present

## 2019-04-24 DIAGNOSIS — E785 Hyperlipidemia, unspecified: Secondary | ICD-10-CM | POA: Diagnosis not present

## 2019-04-24 DIAGNOSIS — C50912 Malignant neoplasm of unspecified site of left female breast: Secondary | ICD-10-CM | POA: Diagnosis not present

## 2019-04-24 DIAGNOSIS — I10 Essential (primary) hypertension: Secondary | ICD-10-CM | POA: Diagnosis not present

## 2019-04-24 DIAGNOSIS — C7951 Secondary malignant neoplasm of bone: Secondary | ICD-10-CM | POA: Diagnosis not present

## 2019-04-25 DIAGNOSIS — C787 Secondary malignant neoplasm of liver and intrahepatic bile duct: Secondary | ICD-10-CM | POA: Diagnosis not present

## 2019-04-25 DIAGNOSIS — Z170 Estrogen receptor positive status [ER+]: Secondary | ICD-10-CM | POA: Diagnosis not present

## 2019-04-25 DIAGNOSIS — C50912 Malignant neoplasm of unspecified site of left female breast: Secondary | ICD-10-CM | POA: Diagnosis not present

## 2019-04-25 DIAGNOSIS — I10 Essential (primary) hypertension: Secondary | ICD-10-CM | POA: Diagnosis not present

## 2019-04-25 DIAGNOSIS — E785 Hyperlipidemia, unspecified: Secondary | ICD-10-CM | POA: Diagnosis not present

## 2019-04-25 DIAGNOSIS — C7951 Secondary malignant neoplasm of bone: Secondary | ICD-10-CM | POA: Diagnosis not present

## 2019-04-26 DIAGNOSIS — C787 Secondary malignant neoplasm of liver and intrahepatic bile duct: Secondary | ICD-10-CM | POA: Diagnosis not present

## 2019-04-26 DIAGNOSIS — E785 Hyperlipidemia, unspecified: Secondary | ICD-10-CM | POA: Diagnosis not present

## 2019-04-26 DIAGNOSIS — Z170 Estrogen receptor positive status [ER+]: Secondary | ICD-10-CM | POA: Diagnosis not present

## 2019-04-26 DIAGNOSIS — C7951 Secondary malignant neoplasm of bone: Secondary | ICD-10-CM | POA: Diagnosis not present

## 2019-04-26 DIAGNOSIS — C50912 Malignant neoplasm of unspecified site of left female breast: Secondary | ICD-10-CM | POA: Diagnosis not present

## 2019-04-26 DIAGNOSIS — I10 Essential (primary) hypertension: Secondary | ICD-10-CM | POA: Diagnosis not present

## 2019-04-28 ENCOUNTER — Inpatient Hospital Stay: Payer: Medicare Other

## 2019-05-05 ENCOUNTER — Other Ambulatory Visit: Payer: Medicare Other | Admitting: Nurse Practitioner

## 2019-05-17 DEATH — deceased
